# Patient Record
Sex: Male | Born: 1950 | Race: White | Hispanic: No | Marital: Married | Smoking: Never smoker
Health system: Southern US, Community
[De-identification: ages and names within clinical notes are randomized; demographics above are authoritative.]

## PROBLEM LIST (undated history)

## (undated) DIAGNOSIS — M5030 Other cervical disc degeneration, unspecified cervical region: Secondary | ICD-10-CM

## (undated) DIAGNOSIS — T7840XA Allergy, unspecified, initial encounter: Secondary | ICD-10-CM

## (undated) DIAGNOSIS — R0602 Shortness of breath: Secondary | ICD-10-CM

## (undated) DIAGNOSIS — M1990 Unspecified osteoarthritis, unspecified site: Secondary | ICD-10-CM

## (undated) DIAGNOSIS — C801 Malignant (primary) neoplasm, unspecified: Secondary | ICD-10-CM

## (undated) DIAGNOSIS — R51 Headache: Secondary | ICD-10-CM

## (undated) DIAGNOSIS — E7800 Pure hypercholesterolemia, unspecified: Secondary | ICD-10-CM

## (undated) DIAGNOSIS — Z8739 Personal history of other diseases of the musculoskeletal system and connective tissue: Secondary | ICD-10-CM

## (undated) DIAGNOSIS — T4145XA Adverse effect of unspecified anesthetic, initial encounter: Secondary | ICD-10-CM

## (undated) DIAGNOSIS — K219 Gastro-esophageal reflux disease without esophagitis: Secondary | ICD-10-CM

## (undated) DIAGNOSIS — K635 Polyp of colon: Secondary | ICD-10-CM

## (undated) DIAGNOSIS — Z85828 Personal history of other malignant neoplasm of skin: Secondary | ICD-10-CM

## (undated) DIAGNOSIS — Z87442 Personal history of urinary calculi: Secondary | ICD-10-CM

## (undated) DIAGNOSIS — G4730 Sleep apnea, unspecified: Secondary | ICD-10-CM

## (undated) DIAGNOSIS — M25519 Pain in unspecified shoulder: Secondary | ICD-10-CM

## (undated) DIAGNOSIS — E785 Hyperlipidemia, unspecified: Secondary | ICD-10-CM

## (undated) DIAGNOSIS — J189 Pneumonia, unspecified organism: Secondary | ICD-10-CM

## (undated) DIAGNOSIS — T8859XA Other complications of anesthesia, initial encounter: Secondary | ICD-10-CM

## (undated) DIAGNOSIS — J309 Allergic rhinitis, unspecified: Secondary | ICD-10-CM

## (undated) HISTORY — PX: JOINT REPLACEMENT: SHX530

## (undated) HISTORY — DX: Personal history of urinary calculi: Z87.442

## (undated) HISTORY — DX: Allergy, unspecified, initial encounter: T78.40XA

## (undated) HISTORY — PX: OTHER SURGICAL HISTORY: SHX169

## (undated) HISTORY — DX: Pure hypercholesterolemia, unspecified: E78.00

## (undated) HISTORY — DX: Hyperlipidemia, unspecified: E78.5

## (undated) HISTORY — DX: Unspecified osteoarthritis, unspecified site: M19.90

## (undated) HISTORY — PX: WISDOM TOOTH EXTRACTION: SHX21

## (undated) HISTORY — DX: Polyp of colon: K63.5

## (undated) HISTORY — DX: Allergic rhinitis, unspecified: J30.9

## (undated) HISTORY — PX: COLONOSCOPY: SHX174

## (undated) HISTORY — PX: APPENDECTOMY: SHX54

## (undated) HISTORY — PX: KNEE ARTHROSCOPY: SHX127

## (undated) HISTORY — DX: Shortness of breath: R06.02

## (undated) HISTORY — PX: SHOULDER SURGERY: SHX246

---

## 2001-01-16 ENCOUNTER — Encounter: Admission: RE | Admit: 2001-01-16 | Discharge: 2001-03-11 | Payer: Self-pay | Admitting: Internal Medicine

## 2002-09-23 ENCOUNTER — Ambulatory Visit (HOSPITAL_COMMUNITY): Admission: RE | Admit: 2002-09-23 | Discharge: 2002-09-23 | Payer: Self-pay | Admitting: *Deleted

## 2004-08-17 ENCOUNTER — Ambulatory Visit: Payer: Self-pay | Admitting: Internal Medicine

## 2004-11-08 ENCOUNTER — Ambulatory Visit: Payer: Self-pay | Admitting: Internal Medicine

## 2006-08-20 ENCOUNTER — Ambulatory Visit: Payer: Self-pay | Admitting: Internal Medicine

## 2006-08-20 LAB — CONVERTED CEMR LAB
ALT: 31 units/L (ref 0–40)
AST: 44 units/L — ABNORMAL HIGH (ref 0–37)
Albumin: 4.1 g/dL (ref 3.5–5.2)
Alkaline Phosphatase: 26 units/L — ABNORMAL LOW (ref 39–117)
BUN: 28 mg/dL — ABNORMAL HIGH (ref 6–23)
Basophils Absolute: 0 10*3/uL (ref 0.0–0.1)
Basophils Relative: 0.6 % (ref 0.0–1.0)
CO2: 33 meq/L — ABNORMAL HIGH (ref 19–32)
Calcium: 9.1 mg/dL (ref 8.4–10.5)
Chloride: 106 meq/L (ref 96–112)
Chol/HDL Ratio, serum: 2.4
Cholesterol: 170 mg/dL (ref 0–200)
Creatinine, Ser: 1.3 mg/dL (ref 0.4–1.5)
Eosinophil percent: 2.7 % (ref 0.0–5.0)
GFR calc non Af Amer: 61 mL/min
Glomerular Filtration Rate, Af Am: 74 mL/min/{1.73_m2}
Glucose, Bld: 94 mg/dL (ref 70–99)
HCT: 48 % (ref 39.0–52.0)
HDL: 72 mg/dL (ref >39.0)
Hemoglobin: 16.4 g/dL (ref 13.0–17.0)
LDL Cholesterol: 88 mg/dL (ref 0–99)
Lymphocytes Relative: 38.6 % (ref 12.0–46.0)
MCHC: 34.3 g/dL (ref 30.0–36.0)
MCV: 95 fL (ref 78.0–100.0)
Monocytes Absolute: 0.7 10*3/uL (ref 0.2–0.7)
Monocytes Relative: 11.7 % — ABNORMAL HIGH (ref 3.0–11.0)
Neutro Abs: 2.7 10*3/uL (ref 1.4–7.7)
Neutrophils Relative %: 46.4 % (ref 43.0–77.0)
PSA: 1.83 ng/mL (ref 0.10–4.00)
Platelets: 266 10*3/uL (ref 150–400)
Potassium: 3.8 meq/L (ref 3.5–5.1)
RBC: 5.05 M/uL (ref 4.22–5.81)
RDW: 12.4 % (ref 11.5–14.6)
Sodium: 148 meq/L — ABNORMAL HIGH (ref 135–145)
TSH: 1.89 microintl units/mL (ref 0.35–5.50)
Total Bilirubin: 1.5 mg/dL — ABNORMAL HIGH (ref 0.3–1.2)
Total Protein: 7.2 g/dL (ref 6.0–8.3)
Triglyceride fasting, serum: 52 mg/dL (ref 0–149)
VLDL: 10 mg/dL (ref 0–40)
WBC: 5.9 10*3/uL (ref 4.5–10.5)

## 2006-08-27 ENCOUNTER — Ambulatory Visit: Payer: Self-pay | Admitting: Internal Medicine

## 2006-09-13 ENCOUNTER — Ambulatory Visit: Payer: Self-pay | Admitting: Internal Medicine

## 2006-09-27 ENCOUNTER — Ambulatory Visit: Payer: Self-pay | Admitting: Gastroenterology

## 2006-09-27 ENCOUNTER — Encounter (INDEPENDENT_AMBULATORY_CARE_PROVIDER_SITE_OTHER): Payer: Self-pay | Admitting: Specialist

## 2007-02-01 ENCOUNTER — Ambulatory Visit: Payer: Self-pay | Admitting: Internal Medicine

## 2007-04-22 DIAGNOSIS — M1990 Unspecified osteoarthritis, unspecified site: Secondary | ICD-10-CM

## 2007-04-22 DIAGNOSIS — E7800 Pure hypercholesterolemia, unspecified: Secondary | ICD-10-CM

## 2007-04-22 DIAGNOSIS — J309 Allergic rhinitis, unspecified: Secondary | ICD-10-CM

## 2007-04-22 HISTORY — DX: Pure hypercholesterolemia, unspecified: E78.00

## 2007-04-22 HISTORY — DX: Unspecified osteoarthritis, unspecified site: M19.90

## 2007-04-22 HISTORY — DX: Allergic rhinitis, unspecified: J30.9

## 2007-05-01 ENCOUNTER — Ambulatory Visit: Payer: Self-pay | Admitting: Internal Medicine

## 2007-05-01 ENCOUNTER — Telehealth (INDEPENDENT_AMBULATORY_CARE_PROVIDER_SITE_OTHER): Payer: Self-pay

## 2007-05-03 ENCOUNTER — Telehealth: Payer: Self-pay | Admitting: Family Medicine

## 2007-05-07 ENCOUNTER — Ambulatory Visit: Payer: Self-pay | Admitting: Internal Medicine

## 2007-05-07 LAB — CONVERTED CEMR LAB
Bilirubin Urine: NEGATIVE
Blood in Urine, dipstick: NEGATIVE
Glucose, Urine, Semiquant: NEGATIVE
Ketones, urine, test strip: NEGATIVE
Nitrite: NEGATIVE
Protein, U semiquant: NEGATIVE
Specific Gravity, Urine: 1.02
Urobilinogen, UA: NEGATIVE
WBC Urine, dipstick: NEGATIVE
pH: 6

## 2007-06-01 ENCOUNTER — Encounter: Payer: Self-pay | Admitting: Internal Medicine

## 2007-06-01 ENCOUNTER — Ambulatory Visit: Payer: Self-pay | Admitting: Family Medicine

## 2007-07-29 ENCOUNTER — Ambulatory Visit: Payer: Self-pay | Admitting: Internal Medicine

## 2007-07-29 DIAGNOSIS — Z87442 Personal history of urinary calculi: Secondary | ICD-10-CM | POA: Insufficient documentation

## 2007-07-29 DIAGNOSIS — E785 Hyperlipidemia, unspecified: Secondary | ICD-10-CM

## 2007-07-29 HISTORY — DX: Personal history of urinary calculi: Z87.442

## 2007-07-29 HISTORY — DX: Hyperlipidemia, unspecified: E78.5

## 2007-07-29 LAB — CONVERTED CEMR LAB
ALT: 61 units/L — ABNORMAL HIGH (ref 0–53)
AST: 47 units/L — ABNORMAL HIGH (ref 0–37)
Albumin: 3.8 g/dL (ref 3.5–5.2)
Alkaline Phosphatase: 41 units/L (ref 39–117)
BUN: 23 mg/dL (ref 6–23)
Basophils Absolute: 0 10*3/uL (ref 0.0–0.1)
Basophils Relative: 0.5 % (ref 0.0–1.0)
Bilirubin Urine: NEGATIVE
Bilirubin, Direct: 0.1 mg/dL (ref 0.0–0.3)
Blood in Urine, dipstick: NEGATIVE
CO2: 32 meq/L (ref 19–32)
Calcium: 9 mg/dL (ref 8.4–10.5)
Chloride: 103 meq/L (ref 96–112)
Cholesterol: 190 mg/dL (ref 0–200)
Creatinine, Ser: 1.1 mg/dL (ref 0.4–1.5)
Eosinophils Absolute: 0.5 10*3/uL (ref 0.0–0.6)
Eosinophils Relative: 7.9 % — ABNORMAL HIGH (ref 0.0–5.0)
GFR calc Af Amer: 89 mL/min
GFR calc non Af Amer: 74 mL/min
Glucose, Bld: 103 mg/dL — ABNORMAL HIGH (ref 70–99)
Glucose, Urine, Semiquant: NEGATIVE
HCT: 42.6 % (ref 39.0–52.0)
HDL: 67.3 mg/dL (ref >39.0)
Hemoglobin: 14.9 g/dL (ref 13.0–17.0)
Ketones, urine, test strip: NEGATIVE
LDL Cholesterol: 112 mg/dL — ABNORMAL HIGH (ref 0–99)
Lymphocytes Relative: 20.8 % (ref 12.0–46.0)
MCHC: 34.9 g/dL (ref 30.0–36.0)
MCV: 96.4 fL (ref 78.0–100.0)
Monocytes Absolute: 0.6 10*3/uL (ref 0.2–0.7)
Monocytes Relative: 9.8 % (ref 3.0–11.0)
Neutro Abs: 4 10*3/uL (ref 1.4–7.7)
Neutrophils Relative %: 61 % (ref 43.0–77.0)
Nitrite: NEGATIVE
PSA: 1.64 ng/mL (ref 0.10–4.00)
Platelets: 262 10*3/uL (ref 150–400)
Potassium: 4.8 meq/L (ref 3.5–5.1)
Protein, U semiquant: NEGATIVE
RBC: 4.42 M/uL (ref 4.22–5.81)
RDW: 12.7 % (ref 11.5–14.6)
Sodium: 141 meq/L (ref 135–145)
Specific Gravity, Urine: 1.01
TSH: 1.58 microintl units/mL (ref 0.35–5.50)
Total Bilirubin: 1 mg/dL (ref 0.3–1.2)
Total CHOL/HDL Ratio: 2.8
Total Protein: 6.4 g/dL (ref 6.0–8.3)
Triglycerides: 54 mg/dL (ref 0–149)
Urobilinogen, UA: 0.2
VLDL: 11 mg/dL (ref 0–40)
WBC Urine, dipstick: NEGATIVE
WBC: 6.4 10*3/uL (ref 4.5–10.5)
pH: 7

## 2007-08-21 ENCOUNTER — Ambulatory Visit: Payer: Self-pay | Admitting: Internal Medicine

## 2007-08-21 DIAGNOSIS — M549 Dorsalgia, unspecified: Secondary | ICD-10-CM | POA: Insufficient documentation

## 2007-08-23 ENCOUNTER — Telehealth: Payer: Self-pay | Admitting: Internal Medicine

## 2007-09-16 ENCOUNTER — Telehealth: Payer: Self-pay | Admitting: Internal Medicine

## 2007-09-24 ENCOUNTER — Encounter: Payer: Self-pay | Admitting: Internal Medicine

## 2007-10-02 ENCOUNTER — Encounter: Payer: Self-pay | Admitting: Internal Medicine

## 2008-03-16 ENCOUNTER — Encounter: Payer: Self-pay | Admitting: Internal Medicine

## 2008-11-09 ENCOUNTER — Telehealth: Payer: Self-pay | Admitting: Internal Medicine

## 2009-03-05 ENCOUNTER — Telehealth: Payer: Self-pay | Admitting: Internal Medicine

## 2009-03-08 ENCOUNTER — Ambulatory Visit: Payer: Self-pay | Admitting: Internal Medicine

## 2009-03-08 LAB — CONVERTED CEMR LAB
ALT: 26 units/L (ref 0–53)
AST: 31 units/L (ref 0–37)
Albumin: 3.9 g/dL (ref 3.5–5.2)
Alkaline Phosphatase: 33 units/L — ABNORMAL LOW (ref 39–117)
BUN: 26 mg/dL — ABNORMAL HIGH (ref 6–23)
Basophils Absolute: 0 10*3/uL (ref 0.0–0.1)
Basophils Relative: 0.8 % (ref 0.0–3.0)
Bilirubin Urine: NEGATIVE
Bilirubin, Direct: 0.1 mg/dL (ref 0.0–0.3)
Blood in Urine, dipstick: NEGATIVE
CO2: 28 meq/L (ref 19–32)
Calcium: 8.9 mg/dL (ref 8.4–10.5)
Chloride: 106 meq/L (ref 96–112)
Cholesterol: 181 mg/dL (ref 0–200)
Creatinine, Ser: 1.2 mg/dL (ref 0.4–1.5)
Eosinophils Absolute: 0.2 10*3/uL (ref 0.0–0.7)
Eosinophils Relative: 5.2 % — ABNORMAL HIGH (ref 0.0–5.0)
GFR calc non Af Amer: 66.1 mL/min (ref >60)
Glucose, Bld: 97 mg/dL (ref 70–99)
Glucose, Urine, Semiquant: NEGATIVE
HCT: 45.4 % (ref 39.0–52.0)
HDL: 65 mg/dL (ref >39.00)
Hemoglobin: 15.8 g/dL (ref 13.0–17.0)
Ketones, urine, test strip: NEGATIVE
LDL Cholesterol: 97 mg/dL (ref 0–99)
Lymphocytes Relative: 35.4 % (ref 12.0–46.0)
Lymphs Abs: 1.6 10*3/uL (ref 0.7–4.0)
MCHC: 34.8 g/dL (ref 30.0–36.0)
MCV: 96.9 fL (ref 78.0–100.0)
Monocytes Absolute: 0.5 10*3/uL (ref 0.1–1.0)
Monocytes Relative: 10.7 % (ref 3.0–12.0)
Neutro Abs: 2.2 10*3/uL (ref 1.4–7.7)
Neutrophils Relative %: 47.9 % (ref 43.0–77.0)
Nitrite: NEGATIVE
PSA: 1.91 ng/mL (ref 0.10–4.00)
Platelets: 214 10*3/uL (ref 150.0–400.0)
Potassium: 4.6 meq/L (ref 3.5–5.1)
Protein, U semiquant: NEGATIVE
RBC: 4.69 M/uL (ref 4.22–5.81)
RDW: 12 % (ref 11.5–14.6)
Sodium: 136 meq/L (ref 135–145)
Specific Gravity, Urine: 1.015
TSH: 1.1 microintl units/mL (ref 0.35–5.50)
Total Bilirubin: 1.2 mg/dL (ref 0.3–1.2)
Total CHOL/HDL Ratio: 3
Total Protein: 6.8 g/dL (ref 6.0–8.3)
Triglycerides: 94 mg/dL (ref 0.0–149.0)
Urobilinogen, UA: 0.2
VLDL: 18.8 mg/dL (ref 0.0–40.0)
WBC Urine, dipstick: NEGATIVE
WBC: 4.5 10*3/uL (ref 4.5–10.5)
pH: 7

## 2009-03-10 ENCOUNTER — Ambulatory Visit: Payer: Self-pay | Admitting: Internal Medicine

## 2009-05-28 ENCOUNTER — Ambulatory Visit: Payer: Self-pay | Admitting: Internal Medicine

## 2009-05-28 DIAGNOSIS — M65849 Other synovitis and tenosynovitis, unspecified hand: Secondary | ICD-10-CM

## 2009-05-28 DIAGNOSIS — M65839 Other synovitis and tenosynovitis, unspecified forearm: Secondary | ICD-10-CM | POA: Insufficient documentation

## 2009-06-02 ENCOUNTER — Telehealth: Payer: Self-pay | Admitting: Internal Medicine

## 2009-07-22 ENCOUNTER — Ambulatory Visit: Payer: Self-pay | Admitting: Internal Medicine

## 2010-03-15 ENCOUNTER — Telehealth: Payer: Self-pay | Admitting: Internal Medicine

## 2010-08-16 ENCOUNTER — Encounter: Payer: Self-pay | Admitting: Internal Medicine

## 2010-08-16 ENCOUNTER — Telehealth: Payer: Self-pay | Admitting: Internal Medicine

## 2010-08-19 ENCOUNTER — Encounter: Admission: RE | Admit: 2010-08-19 | Discharge: 2010-08-19 | Payer: Self-pay | Admitting: Specialist

## 2010-10-25 NOTE — Letter (Signed)
Summary: [facility]  [facility]   Imported By: Maryln Gottron 08/25/2010 14:12:33  _____________________________________________________________________  External Attachment:    Type:   Image     Comment:   External Document

## 2010-10-25 NOTE — Progress Notes (Signed)
Summary: 90 day rx of Vicodin  Phone Note Refill Request Message from:  Fax from Pharmacy on March 15, 2010 3:45 PM  Refills Requested: Medication #1:  VICODIN 5-500 MG TABS 1 by mouth q 4 hours as needed. per fax from [facility] - pt leaving the country - wants a 90 days supply   Method Requested: Telephone to Pharmacy Initial call taken by: Duard Brady LPN,  March 15, 2010 3:46 PM  Follow-up for Phone Call        This was just okay'd # 50 with 4RF today. KIK Follow-up by: Duard Brady LPN,  March 15, 2010 3:46 PM  Additional Follow-up for Phone Call Additional follow up Details #1::        OK to change to 120 Additional Follow-up by: Gordy Savers  MD,  March 15, 2010 3:59 PM    Additional Follow-up for Phone Call Additional follow up Details #2::    spoke with joe at [facility] - cx #50 4rf and gave ok on disp #120 no additional refills. KIK Follow-up by: Duard Brady LPN,  March 15, 2010 4:33 PM  Prescriptions: VICODIN 5-500 MG TABS (HYDROCODONE-ACETAMINOPHEN) 1 by mouth q 4 hours as needed.  #120 x 0   Entered by:   Duard Brady LPN   Authorized by:   Gordy Savers  MD   Signed by:   Duard Brady LPN on 16/06/9603   Method used:   Historical   RxID:   [id number]

## 2010-10-25 NOTE — Progress Notes (Signed)
Summary: refill vicodin and pravastatin  Phone Note Refill Request Message from:  Fax from Pharmacy on August 16, 2010 12:05 PM  Refills Requested: Medication #1:  VICODIN 5-500 MG TABS 1 by mouth q 4 hours as needed.   Last Refilled: 07/15/2010  Medication #2:  PRAVASTATIN SODIUM 40 MG TABS one daily   Last Refilled: 07/11/2010 [facility] [address]   Method Requested: Fax to Local Pharmacy Initial call taken by: Duard Brady LPN,  August 16, 2010 12:06 PM    Prescriptions: PRAVASTATIN SODIUM 40 MG TABS (PRAVASTATIN SODIUM) one daily  #90 x 0   Entered by:   Duard Brady LPN   Authorized by:   Gordy Savers  MD   Signed by:   Duard Brady LPN on 16/06/9603   Method used:   Historical   RxID:   [id number] VICODIN 5-500 MG TABS (HYDROCODONE-ACETAMINOPHEN) 1 by mouth q 4 hours as needed.  #90 x 0   Entered by:   Duard Brady LPN   Authorized by:   Gordy Savers  MD   Signed by:   Duard Brady LPN on 21/30/8657   Method used:   Historical   RxID:   [id number]  #90 each

## 2010-10-27 NOTE — Letter (Signed)
Summary: [facility]  [facility]   Imported By: Maryln Gottron 09/07/2010 09:04:07  _____________________________________________________________________  External Attachment:    Type:   Image     Comment:   External Document

## 2010-11-16 ENCOUNTER — Other Ambulatory Visit: Payer: Self-pay | Admitting: Internal Medicine

## 2011-02-07 NOTE — Assessment & Plan Note (Signed)
[facility]                                 ON-CALL NOTE   NAME:[name] [name], [name] [name]                        MRN:          [id number]  DATE:06/01/2007                            DOB:          Jul 07, 1951    TIME:  12:00.   OBJECTIVE:  The patient just landed from traveling and must leave again  tomorrow due to his work. Had a plug of wax in his ear this morning when  he woke up. He tried to get it out with a Q-tip and ended up pushing it  into his ear and now cannot hear out of that ear and is having  tenderness.   IMPRESSION:  Probable cerumen impaction.   PLAN:  The patient will come in to be seen.   PRIMARY CARE PHYSICIAN:  Eleonore Chiquito, M.D., [facility] office.     Arta Silence, MD  Electronically Signed    RNS/MedQ  DD: 06/01/2007  DT: 06/01/2007  Job #: [id number]   cc:   Gordy Savers, MD

## 2011-02-10 NOTE — Assessment & Plan Note (Signed)
[facility]                            [facility] OFFICE NOTE   NAME:[name] [name]                        MRN:          [id number]  DATE:08/27/2006                            DOB:          [birth year]    A 60 year old gentleman who is seen today for an annual exam and has a  history of hypercholesterolemia controlled on Lipitor, mild seasonal  allergic rhinitis. He has DJD affecting primarily his shoulders and  knees. He has had a remote appendectomy. His rheumatologic status seems  fairly stable.   SOCIAL HISTORY:  He has been unemployed until recently, presently  commuting to the [address] 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   FAMILY HISTORY:  Unchanged.   REVIEW OF SYSTEMS:  Otherwise negative. Still no colonoscopy.   PHYSICAL EXAMINATION:  VITAL SIGNS:  Blood pressure was 120/80.  SKIN:  Negative.  HEENT:  Fundi, ear, nose and throat clear.  NECK:  No adenopathy or bruits.  CHEST:  Clear.  CARDIOVASCULAR:  Normal heart sounds, no murmurs.  ABDOMEN:  Soft and nontender. No organomegaly.  EXTERNAL GENITALIA:  Normal.  RECTAL:  Prostate was benign, +1 to 2 enlarged, stool was trace heme  positive.  EXTREMITIES:  Negative with full peripheral pulses.  NEUROLOGIC:  Negative.   IMPRESSION:  Hypercholesterolemia, degenerative joint disease, seasonal  allergic rhinitis, heme positive stool.   DISPOSITION:  The patient is agreeable to a colonoscopy. This will be  set up at his earliest convenience. Return here in one year or p.r.n.     Gordy Savers, MD  Electronically Signed    PFK/MedQ  DD: 08/27/2006  DT: 08/28/2006  Job #: [id number]

## 2011-02-17 ENCOUNTER — Other Ambulatory Visit: Payer: Self-pay | Admitting: Internal Medicine

## 2011-02-17 NOTE — Telephone Encounter (Signed)
Spoke with pt - per dr.kwiatkowsi - needs to be seen for future refills - last seen 2010 , will need cpx with labs. Okay'd [facility] day RF to [facility]

## 2011-02-17 NOTE — Telephone Encounter (Signed)
Faxes were sent here this morning and would like them sent back to [facility]. Going out of town tomorrow.

## 2011-03-24 ENCOUNTER — Encounter: Payer: Self-pay | Admitting: Internal Medicine

## 2011-03-27 ENCOUNTER — Ambulatory Visit (INDEPENDENT_AMBULATORY_CARE_PROVIDER_SITE_OTHER): Payer: Self-pay | Admitting: Internal Medicine

## 2011-03-27 ENCOUNTER — Encounter: Payer: Self-pay | Admitting: Internal Medicine

## 2011-03-27 VITALS — BP 100/70 | HR 60 | Temp 98.1°F | Resp 16 | Ht 68.75 in | Wt 173.0 lb

## 2011-03-27 DIAGNOSIS — Z0000 Encounter for general adult medical examination without abnormal findings: Secondary | ICD-10-CM

## 2011-03-27 DIAGNOSIS — Z87442 Personal history of urinary calculi: Secondary | ICD-10-CM

## 2011-03-27 DIAGNOSIS — Z23 Encounter for immunization: Secondary | ICD-10-CM

## 2011-03-27 DIAGNOSIS — M1990 Unspecified osteoarthritis, unspecified site: Secondary | ICD-10-CM

## 2011-03-27 DIAGNOSIS — E7800 Pure hypercholesterolemia, unspecified: Secondary | ICD-10-CM

## 2011-03-27 LAB — CBC WITH DIFFERENTIAL/PLATELET
Basophils Relative: 0.7 % (ref 0.0–3.0)
Eosinophils Absolute: 0.2 10*3/uL (ref 0.0–0.7)
Eosinophils Relative: 4.3 % (ref 0.0–5.0)
Lymphocytes Relative: 29.9 % (ref 12.0–46.0)
Neutrophils Relative %: 54.1 % (ref 43.0–77.0)
Platelets: 212 10*3/uL (ref 150.0–400.0)
RBC: 4.14 Mil/uL — ABNORMAL LOW (ref 4.22–5.81)
WBC: 5.4 10*3/uL (ref 4.5–10.5)

## 2011-03-27 LAB — HEPATIC FUNCTION PANEL
Alkaline Phosphatase: 34 U/L — ABNORMAL LOW (ref 39–117)
Bilirubin, Direct: 0.2 mg/dL (ref 0.0–0.3)
Total Bilirubin: 1.2 mg/dL (ref 0.3–1.2)

## 2011-03-27 LAB — LIPID PANEL
HDL: 81 mg/dL (ref >39.00)
Total CHOL/HDL Ratio: 3
Triglycerides: 46 mg/dL (ref 0.0–149.0)
VLDL: 9.2 mg/dL (ref 0.0–40.0)

## 2011-03-27 LAB — BASIC METABOLIC PANEL
BUN: 28 mg/dL — ABNORMAL HIGH (ref 6–23)
Chloride: 104 mEq/L (ref 96–112)
GFR: 68.94 mL/min (ref >60.00)
Glucose, Bld: 88 mg/dL (ref 70–99)
Potassium: 4.5 mEq/L (ref 3.5–5.1)
Sodium: 140 mEq/L (ref 135–145)

## 2011-03-27 LAB — LDL CHOLESTEROL, DIRECT: Direct LDL: 121.4 mg/dL

## 2011-03-27 LAB — URIC ACID: Uric Acid, Serum: 7.2 mg/dL (ref 4.0–7.8)

## 2011-03-27 MED ORDER — ALLOPURINOL 100 MG PO TABS: 100.0000 mg | ORAL_TABLET | Freq: Every day | ORAL | Status: DC

## 2011-03-27 MED ORDER — PREGABALIN 75 MG PO CAPS: 75.0000 mg | ORAL_CAPSULE | Freq: Two times a day (BID) | ORAL | Status: DC | PRN

## 2011-03-27 MED ORDER — PRAVASTATIN SODIUM 40 MG PO TABS: 40.0000 mg | ORAL_TABLET | Freq: Every day | ORAL | Status: DC

## 2011-03-27 MED ORDER — NAPROXEN SODIUM 550 MG PO TABS: 550.0000 mg | ORAL_TABLET | Freq: Two times a day (BID) | ORAL | Status: DC

## 2011-03-27 MED ORDER — TADALAFIL 20 MG PO TABS: 20.0000 mg | ORAL_TABLET | ORAL | Status: DC

## 2011-03-27 MED ORDER — HYDROCODONE-ACETAMINOPHEN 5-500 MG PO TABS: 1.0000 | ORAL_TABLET | Freq: Four times a day (QID) | ORAL | Status: DC | PRN

## 2011-03-27 NOTE — Progress Notes (Signed)
Subjective:    Patient ID: [name] [name], male    DOB: [birth year], 60 y.o.   MRN: [id number]  HPI  60 year old patient who is seen today for a health maintenance examination. He is doing quite well. Since his last visit here he has lost considerable weight due to the low back and knee pain. He is followed closely by orthopedics. He has a history of nephrolithiasis that has been diagnosed as you're casted stones. He is now on allopurinol. He has dyslipidemia controlled on pravastatin. He continues to tolerate this well. His only complaints are orthopedic. He is scheduled for orthopedic followup later today.  Wt Readings from Last 3 Encounters:  03/27/11 173 lb (78.472 kg)  07/22/09 190 lb (86.183 kg)  05/28/09 198 lb (89.812 kg)    Problems Prior to Update:  1) Back Pain (ICD-724.5)  2) Osteoarthritis (ICD-715.90)  3) Nephrolithiasis, Hx of (ICD-V13.01)  4) Hyperlipidemia (ICD-272.4)  5) Health Maintenance Exam (ICD-V70.0)  6) Allergic Rhinitis (ICD-477.9)  7) Hypercholesterolemia (ICD-272.0)  8) Degenerative Joint Disease (ICD-715.90)  9) Family History of Melanoma (ICD-V16.8)  Allergies:  1) ! Pcn  2) ! Erythromycin  3) ! Amoxicillin  4) ! Benadryl  5) ! Codeine  Past History:  Past Medical History:  Allergic rhinitis,seasonal  Hyperlipidemia  Nephrolithiasis, hx of colic  history of spinal stenosis  Osteoarthritis  Back Pain 2008  Past Surgical History:  Colonoscopy-09/27/2006  Appendectomy  right knee arthroscopic surgery  right shoulder surgery, August 2004  was in the extraction November 2003  Family History:  Reviewed history from 07/29/2007 and no changes required.  Family History Lung cancer  Family History Other cancer-Breast  Family History of Melanoma  Fam hx COPD  father history of COPD, glucose intolerance; PSVT  mother history of breast cancer and melanoma  Two brothers one sister in good health-obesity  Maternal grandfather lung cancer  Social  History:  Reviewed history from 07/29/2007 and no changes required.  Never Smoked  Married  one daughter Married 02-2009    Review of Systems  Constitutional: Negative for fever, chills, activity change, appetite change and fatigue.  HENT: Negative for hearing loss, ear pain, congestion, rhinorrhea, sneezing, mouth sores, trouble swallowing, neck pain, neck stiffness, dental problem, voice change, sinus pressure and tinnitus.   Eyes: Negative for photophobia, pain, redness and visual disturbance.  Respiratory: Negative for apnea, cough, choking, chest tightness, shortness of breath and wheezing.   Cardiovascular: Negative for chest pain, palpitations and leg swelling.  Gastrointestinal: Negative for nausea, vomiting, abdominal pain, diarrhea, constipation, blood in stool, abdominal distention, anal bleeding and rectal pain.  Genitourinary: Negative for dysuria, urgency, frequency, hematuria, flank pain, decreased urine volume, discharge, penile swelling, scrotal swelling, difficulty urinating, genital sores and testicular pain.  Musculoskeletal: Positive for back pain, joint swelling and gait problem. Negative for myalgias and arthralgias.  Skin: Negative for color change, rash and wound.  Neurological: Negative for dizziness, tremors, seizures, syncope, facial asymmetry, speech difficulty, weakness, light-headedness, numbness and headaches.  Hematological: Negative for adenopathy. Does not bruise/bleed easily.  Psychiatric/Behavioral: Negative for suicidal ideas, hallucinations, behavioral problems, confusion, sleep disturbance, self-injury, dysphoric mood, decreased concentration and agitation. The patient is not nervous/anxious.        Objective:   Physical Exam  Constitutional: He is oriented to person, place, and time. He appears well-developed.  HENT:  Head: Normocephalic.  Right Ear: External ear normal.  Left Ear: External ear normal.  Eyes: Conjunctivae and EOM are normal.    Neck:  Normal range of motion.  Cardiovascular: Normal rate and normal heart sounds.        Pulse slow and slightly irregular  Pulmonary/Chest: Effort normal and breath sounds normal.  Abdominal: Bowel sounds are normal.  Musculoskeletal: Normal range of motion. He exhibits no edema and no tenderness.       Right knee with [name] her arthritic changes and deformity  Neurological: He is alert and oriented to person, place, and time.  Psychiatric: He has a normal mood and affect. His behavior is normal.          Assessment & Plan:   Unremarkable health maintenance  examination Advanced osteoarthritis with low back pain and right knee pain Dyslipidemia  Laboratory studies will be reviewed including a uric acid level Return here in one year or when necessary

## 2011-03-27 NOTE — Patient Instructions (Signed)
It is important that you exercise regularly, at least 20 minutes 3 to 4 times per week.  If you develop chest pain or shortness of breath seek  medical attention.  Return in one year for follow-up   

## 2011-03-27 NOTE — Progress Notes (Signed)
Quick Note:  Labs ready for pick up KIK ______

## 2011-03-28 ENCOUNTER — Encounter: Payer: Self-pay | Admitting: Internal Medicine

## 2011-05-15 ENCOUNTER — Ambulatory Visit (HOSPITAL_BASED_OUTPATIENT_CLINIC_OR_DEPARTMENT_OTHER)
Admission: RE | Admit: 2011-05-15 | Discharge: 2011-05-15 | Disposition: A | Discharge status: Home / Self Care | Payer: PRIVATE HEALTH INSURANCE | Source: Ambulatory Visit | Attending: Specialist | Admitting: Specialist

## 2011-05-15 DIAGNOSIS — M2340 Loose body in knee, unspecified knee: Secondary | ICD-10-CM | POA: Insufficient documentation

## 2011-05-15 DIAGNOSIS — Z01812 Encounter for preprocedural laboratory examination: Secondary | ICD-10-CM | POA: Insufficient documentation

## 2011-05-15 DIAGNOSIS — IMO0002 Reserved for concepts with insufficient information to code with codable children: Secondary | ICD-10-CM | POA: Insufficient documentation

## 2011-05-15 DIAGNOSIS — M659 Unspecified synovitis and tenosynovitis, unspecified site: Secondary | ICD-10-CM | POA: Insufficient documentation

## 2011-05-15 DIAGNOSIS — Z130 Encounter for screening for diseases of the blood and blood-forming organs and certain disorders involving the immune mechanism: Secondary | ICD-10-CM | POA: Insufficient documentation

## 2011-05-15 DIAGNOSIS — M23305 Other meniscus derangements, unspecified medial meniscus, unspecified knee: Secondary | ICD-10-CM | POA: Insufficient documentation

## 2011-05-15 DIAGNOSIS — X58XXXA Exposure to other specified factors, initial encounter: Secondary | ICD-10-CM | POA: Insufficient documentation

## 2011-05-15 DIAGNOSIS — M1710 Unilateral primary osteoarthritis, unspecified knee: Secondary | ICD-10-CM | POA: Insufficient documentation

## 2011-05-15 DIAGNOSIS — M21869 Other specified acquired deformities of unspecified lower leg: Secondary | ICD-10-CM | POA: Insufficient documentation

## 2011-05-15 LAB — POCT HEMOGLOBIN-HEMACUE: Hemoglobin: 15.6 g/dL (ref 13.0–17.0)

## 2011-05-17 NOTE — Op Note (Signed)
NAME:  [name], [name] NO.:  [id number]  MEDICAL RECORD NO.:  [id number]  LOCATION:                                 FACILITY:  PHYSICIAN:  Jene Every, M.D.         DATE OF BIRTH:  DATE OF PROCEDURE:  05/15/2011 DATE OF DISCHARGE:                              OPERATIVE REPORT   PREOPERATIVE DIAGNOSES: 1. Degenerative joint disease, right knee. 2. Meniscus tear.  POSTOPERATIVE DIAGNOSES: 1. Degenerative joint disease, right knee. 2. Meniscus tear. 3. Loose bodies.  PROCEDURE PERFORMED: 1. Right knee arthroscopy. 2. Partial medial meniscectomy. 3. Debridement of patellofemoral joint, evacuation of loose bodies.  BRIEF HISTORY:  This is a 60 year old with varus deformity, significant degenerative changes of medial compartment, patellofemoral arthrosis, MRI indicating meniscus tear, who is indicated for arthroscopic debridement, partial meniscectomy.  Though we had talked about total knee replacement, we were attempting to try all possible conservative measures even though he had a varus deformity, significant arthritis. We discussed risks and benefits including bleeding, infection, damage to neurovascular structures, no change in symptoms, worsening symptoms, need for repeat debridement, DVT, PE, anesthetic complications, and need for total knee.  PREOPERATIVE EXAM:  Patient had a small abrasion over the anterior aspect of the patella from the contusion.  It was not infected. Therefore, after prepping and drape technique, there was no evidence of infection.  TECHNIQUE:  With the patient in supine position after induction of adequate anesthesia, a gram of Kefzol and a gram of vancomycin, the right lower extremity was prepped and draped in usual sterile fashion. Placed an OpSite over the small anterior abrasion that was outside where the portals were.  A lateral parapatellar portal and superomedial parapatellar portal were fashioned with a #11 blade.   Ingress cannula atraumatically placed.  Irrigant was utilized to insufflate joint. Under direct visualization, medial parapatellar portal was fashioned with a #11 blade after localization with 18-gauge needle sparing the medial meniscus.  There were extensive grade 4 changes of medial compartment, both on the femoral condyle and the tibial plateau.  Tibial plateau, there was a small area of light cartilage that still remained by complex tearing of the medial meniscus, but basically, the entire posterior half debrided with a 3.5 Cuda shaver and performed a partial medial meniscectomy to a stable base.  He had approximately 30% of the posterior half remaining.  The remnant was stable to probe palpation.  ACL was frayed in partial tearing.  There was hypertrophic synovitis, this was debrided as well.  Difficulty obtaining the access to the lateral compartment, however, was able to do so.  There was essentially normal femoral condyle and tibial plateau.  Mild fraying of the meniscus was copiously lavaged.  There was one small grade 4 lesion near the notch on the medial femoral condyle nonweightbearing surface.  Suprapatellar pouch revealed extensive grade 3 changes of the patella and the sulcus, so chondroplasty was performed.  There was normal patellofemoral tracking.  The gutters were unremarkable.  Copiously lavaged the knee, reexamined the medial compartment, we had evacuated loose bodies.  No further pathology amenable to arthroscopic intervention, and therefore, removed all instrumentation.  Portals were closed with  4-0 nylon simple sutures.  0.25% Marcaine with epinephrine was infiltrated into the joint.  Wounds dressed sterilely.  Woken without difficulty and transported to recovery room in satisfactory condition.  The patient tolerated the procedure well.  No complications.  No assistant.  At the conclusion of the procedure, I also placed an OpSite over the small area of  abrasion.     Jene Every, M.D.     Cordelia Pen  D:  05/15/2011  T:  05/16/2011  Job:  [id number]  Electronically Signed by Jene Every M.D. on 05/17/2011 10:19:51 AM

## 2011-05-24 ENCOUNTER — Telehealth: Payer: Self-pay | Admitting: Internal Medicine

## 2011-05-24 ENCOUNTER — Other Ambulatory Visit: Payer: Self-pay | Admitting: Internal Medicine

## 2011-05-24 DIAGNOSIS — M1990 Unspecified osteoarthritis, unspecified site: Secondary | ICD-10-CM

## 2011-05-24 MED ORDER — ALLOPURINOL 100 MG PO TABS: 100.0000 mg | ORAL_TABLET | Freq: Every day | ORAL | Status: DC

## 2011-05-24 MED ORDER — NAPROXEN SODIUM 550 MG PO TABS: 550.0000 mg | ORAL_TABLET | Freq: Two times a day (BID) | ORAL | Status: DC

## 2011-05-24 NOTE — Telephone Encounter (Signed)
Patient requests to be tested for allergies; he will only be in the country for this week...call patient with steps he must take to have this done.

## 2011-05-25 MED ORDER — EPINEPHRINE 0.3 MG/0.3ML IJ DEVI: 0.3000 mg | Freq: Once | INTRAMUSCULAR | Status: DC

## 2011-05-25 NOTE — Telephone Encounter (Signed)
Try to make appt with Dr  Callas

## 2011-05-25 NOTE — Telephone Encounter (Signed)
Spoke with pt - his schedule has changed and he will have to put off appt until later.

## 2011-05-25 NOTE — Telephone Encounter (Signed)
Please advise 

## 2011-06-19 ENCOUNTER — Other Ambulatory Visit: Payer: Self-pay | Admitting: Internal Medicine

## 2011-08-14 ENCOUNTER — Other Ambulatory Visit: Payer: Self-pay | Admitting: Internal Medicine

## 2011-10-31 ENCOUNTER — Telehealth: Payer: Self-pay | Admitting: Internal Medicine

## 2011-10-31 MED ORDER — HYDROCODONE-ACETAMINOPHEN 5-500 MG PO TABS: 1.0000 | ORAL_TABLET | Freq: Four times a day (QID) | ORAL | Status: DC | PRN

## 2011-10-31 NOTE — Telephone Encounter (Signed)
Pt last seen 03/27/2011 and rx filled same day.  Pls advise.

## 2011-10-31 NOTE — Telephone Encounter (Signed)
Pt called req refill of HYDROcodone-acetaminophen (VICODIN) 5-500 MG per tablet to [facility] [address]. Pt is req that this be called in today, because pt is leaving to go out of country tomorrow. Pt said that he had req this last wk via the pharmacy. Pls call in today.

## 2011-10-31 NOTE — Telephone Encounter (Signed)
Rx called in to pharmacy. Pt is aware.  

## 2011-10-31 NOTE — Telephone Encounter (Signed)
ok 

## 2011-12-21 ENCOUNTER — Other Ambulatory Visit: Payer: Self-pay | Admitting: Internal Medicine

## 2011-12-21 NOTE — Telephone Encounter (Signed)
Sent to pharmacy 

## 2011-12-21 NOTE — Telephone Encounter (Signed)
Pt requesting refill on pravastatin (PRAVACHOL) 40 MG tablet, allopurinol (ZYLOPRIM) 100 MG tablet and tadalafil (CIALIS) 20 MG tablet   [address] [facility]

## 2012-04-09 ENCOUNTER — Other Ambulatory Visit: Payer: Self-pay | Admitting: Internal Medicine

## 2012-04-22 ENCOUNTER — Telehealth: Payer: Self-pay | Admitting: Internal Medicine

## 2012-04-22 NOTE — Telephone Encounter (Signed)
Spoke with pt- gave name and office number

## 2012-04-22 NOTE — Telephone Encounter (Signed)
Patient is looking for a recommendation for an eye doctor he can use.  Please call patient.

## 2012-04-22 NOTE — Telephone Encounter (Signed)
Dr Groat 

## 2012-04-22 NOTE — Telephone Encounter (Signed)
Please advise 

## 2012-04-29 ENCOUNTER — Other Ambulatory Visit (INDEPENDENT_AMBULATORY_CARE_PROVIDER_SITE_OTHER): Payer: PRIVATE HEALTH INSURANCE

## 2012-04-29 DIAGNOSIS — Z0000 Encounter for general adult medical examination without abnormal findings: Secondary | ICD-10-CM

## 2012-04-29 LAB — CBC WITH DIFFERENTIAL/PLATELET
Basophils Relative: 1 % (ref 0.0–3.0)
Eosinophils Absolute: 0.3 10*3/uL (ref 0.0–0.7)
Eosinophils Relative: 5.9 % — ABNORMAL HIGH (ref 0.0–5.0)
HCT: 45.6 % (ref 39.0–52.0)
Hemoglobin: 15.4 g/dL (ref 13.0–17.0)
Lymphs Abs: 1.8 10*3/uL (ref 0.7–4.0)
MCHC: 33.7 g/dL (ref 30.0–36.0)
MCV: 100.6 fl — ABNORMAL HIGH (ref 78.0–100.0)
Monocytes Absolute: 0.5 10*3/uL (ref 0.1–1.0)
Neutro Abs: 2.3 10*3/uL (ref 1.4–7.7)
Neutrophils Relative %: 46.4 % (ref 43.0–77.0)
RBC: 4.53 Mil/uL (ref 4.22–5.81)
WBC: 4.9 10*3/uL (ref 4.5–10.5)

## 2012-04-29 LAB — HEPATIC FUNCTION PANEL
ALT: 26 U/L (ref 0–53)
Albumin: 3.9 g/dL (ref 3.5–5.2)
Total Protein: 6.5 g/dL (ref 6.0–8.3)

## 2012-04-29 LAB — POCT URINALYSIS DIPSTICK
Bilirubin, UA: NEGATIVE
Blood, UA: NEGATIVE
Glucose, UA: NEGATIVE
Spec Grav, UA: 1.015
Urobilinogen, UA: 0.2

## 2012-04-29 LAB — LIPID PANEL
Cholesterol: 195 mg/dL (ref 0–200)
LDL Cholesterol: 113 mg/dL — ABNORMAL HIGH (ref 0–99)
Triglycerides: 90 mg/dL (ref 0.0–149.0)

## 2012-04-29 LAB — BASIC METABOLIC PANEL
CO2: 30 mEq/L (ref 19–32)
Chloride: 103 mEq/L (ref 96–112)
Creatinine, Ser: 1.2 mg/dL (ref 0.4–1.5)
Potassium: 4.6 mEq/L (ref 3.5–5.1)

## 2012-04-29 LAB — PSA: PSA: 1.39 ng/mL (ref 0.10–4.00)

## 2012-04-29 LAB — TSH: TSH: 1.65 u[IU]/mL (ref 0.35–5.50)

## 2012-05-01 ENCOUNTER — Ambulatory Visit (INDEPENDENT_AMBULATORY_CARE_PROVIDER_SITE_OTHER): Payer: PRIVATE HEALTH INSURANCE | Admitting: Internal Medicine

## 2012-05-01 ENCOUNTER — Encounter: Payer: Self-pay | Admitting: Internal Medicine

## 2012-05-01 VITALS — BP 106/70 | HR 64 | Temp 97.9°F | Resp 20 | Ht 68.75 in | Wt 180.0 lb

## 2012-05-01 DIAGNOSIS — Z0000 Encounter for general adult medical examination without abnormal findings: Secondary | ICD-10-CM

## 2012-05-01 DIAGNOSIS — Z87442 Personal history of urinary calculi: Secondary | ICD-10-CM

## 2012-05-01 DIAGNOSIS — E7800 Pure hypercholesterolemia, unspecified: Secondary | ICD-10-CM

## 2012-05-01 DIAGNOSIS — M549 Dorsalgia, unspecified: Secondary | ICD-10-CM

## 2012-05-01 MED ORDER — PREGABALIN 75 MG PO CAPS: 75.0000 mg | ORAL_CAPSULE | Freq: Two times a day (BID) | ORAL | Status: DC

## 2012-05-01 MED ORDER — TADALAFIL 20 MG PO TABS: 20.0000 mg | ORAL_TABLET | Freq: Every day | ORAL | Status: DC | PRN

## 2012-05-01 MED ORDER — PRAVASTATIN SODIUM 40 MG PO TABS: 40.0000 mg | ORAL_TABLET | Freq: Every day | ORAL | Status: DC

## 2012-05-01 MED ORDER — ALLOPURINOL 100 MG PO TABS: 100.0000 mg | ORAL_TABLET | Freq: Every day | ORAL | Status: DC

## 2012-05-01 NOTE — Progress Notes (Signed)
Subjective:    Patient ID: [name] [name], male    DOB: 1950/11/26, 61 y.o.   MRN: [id number]  HPI 15 -year-old patient who is seen today for a health maintenance examination. He is doing quite well. Since his last visit here he has lost considerable weight due to the low back and knee pain. He is followed closely by orthopedics. 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 He is now on allopurinol. He has dyslipidemia controlled on pravastatin. He continues to tolerate this well. His only complaints are orthopedic. He is scheduled for orthopedic followup right total knee replacement surgery. He is seen today for surgical clearance. He is doing quite well clinically except for arthritic concerns and denies any cardiopulmonary complaints. He is quite active physically without exercise restrictions Wt Readings from Last 3 Encounters:  05/01/12 180 lb (81.647 kg)  03/27/11 173 lb (78.472 kg)  07/22/09 190 lb (86.183 kg)    Problems Prior to Update:  1) Back Pain (ICD-724.5)  2) Osteoarthritis (ICD-715.90)  3) Nephrolithiasis, Hx of (ICD-V13.01)  4) Hyperlipidemia (ICD-272.4)  5) Health Maintenance Exam (ICD-V70.0)  6) Allergic Rhinitis (ICD-477.9)  7) Hypercholesterolemia (ICD-272.0)  8) Degenerative Joint Disease (ICD-715.90)  9) Family History of Melanoma (ICD-V16.8)   Allergies:  1) ! Pcn  2) ! Erythromycin  3) ! Amoxicillin  4) ! Benadryl  5) ! Codeine   Past History:  Past Medical History:   Allergic rhinitis,seasonal  Hyperlipidemia  Nephrolithiasis, hx of colic  history of spinal stenosis  Osteoarthritis  Back Pain 2008   Past Surgical History:  Colonoscopy-09/27/2006  Appendectomy  right knee arthroscopic surgery  9-12  right shoulder surgery, August 2004  was in the extraction November 2003   Family History:  Reviewed history from 07/29/2007 and no changes required.  Family History Lung cancer  Family History Other  cancer-Breast  Family History of Melanoma  Fam hx COPD  father history of COPD, glucose intolerance; PSVT  mother history of breast cancer and melanoma died 2012/10/29Two brothers one sister in good health-obesity  Maternal grandfather lung cancer   Social History:  Reviewed history from 07/29/2007 and no changes required.  Never Smoked  Married  one daughter Married 02-2009    Review of Systems  Constitutional: Negative for fever, chills, activity change, appetite change and fatigue.  HENT: Negative for hearing loss, ear pain, congestion, rhinorrhea, sneezing, mouth sores, trouble swallowing, neck pain, neck stiffness, dental problem, voice change, sinus pressure and tinnitus.   Eyes: Negative for photophobia, pain, redness and visual disturbance.  Respiratory: Negative for apnea, cough, choking, chest tightness, shortness of breath and wheezing.   Cardiovascular: Negative for chest pain, palpitations and leg swelling.  Gastrointestinal: Negative for nausea, vomiting, abdominal pain, diarrhea, constipation, blood in stool, abdominal distention, anal bleeding and rectal pain.  Genitourinary: Negative for dysuria, urgency, frequency, hematuria, flank pain, decreased urine volume, discharge, penile swelling, scrotal swelling, difficulty urinating, genital sores and testicular pain.  Musculoskeletal: Positive for back pain, joint swelling and gait problem. Negative for myalgias and arthralgias.  Skin: Negative for color change, rash and wound.  Neurological: Negative for dizziness, tremors, seizures, syncope, facial asymmetry, speech difficulty, weakness, light-headedness, numbness and headaches.  Hematological: Negative for adenopathy. Does not bruise/bleed easily.  Psychiatric/Behavioral: Negative for suicidal ideas, hallucinations, behavioral problems, confusion, disturbed wake/sleep cycle, self-injury, dysphoric mood, decreased concentration and agitation. The patient is not  nervous/anxious.        Objective:  Physical Exam  Constitutional: He is oriented to person, place, and time. He appears well-developed.  HENT:  Head: Normocephalic.  Right Ear: External ear normal.  Left Ear: External ear normal.  Eyes: Conjunctivae and EOM are normal.  Neck: Normal range of motion.  Cardiovascular: Normal rate and normal heart sounds.        Pulse slow and slightly irregular  Pulmonary/Chest: Effort normal and breath sounds normal.  Abdominal: Bowel sounds are normal.  Musculoskeletal: Normal range of motion. He exhibits no edema and no tenderness.       Right knee with [name] her arthritic changes and deformity  Neurological: He is alert and oriented to person, place, and time.  Psychiatric: He has a normal mood and affect. His behavior is normal.          Assessment & Plan:   Unremarkable health maintenance  examination Advanced osteoarthritis with low back pain and right knee pain Dyslipidemia  Laboratory studies will be reviewed including a uric acid level Return here in one year or when necessary No contraindications to the anticipated orthopedic surgery

## 2012-05-01 NOTE — Patient Instructions (Signed)
Limit your sodium (Salt) intake    It is important that you exercise regularly, at least 20 minutes 3 to 4 times per week.  If you develop chest pain or shortness of breath seek  medical attention.  Return in one year for follow-up   

## 2012-05-13 ENCOUNTER — Other Ambulatory Visit: Payer: Self-pay | Admitting: Specialist

## 2012-05-22 ENCOUNTER — Encounter (HOSPITAL_COMMUNITY): Payer: Self-pay | Admitting: Pharmacy Technician

## 2012-05-23 ENCOUNTER — Other Ambulatory Visit: Payer: Self-pay | Admitting: Physician Assistant

## 2012-05-23 NOTE — H&P (Signed)
CC: right Knee pain HPI:  [name]    With worsening right knee pain secondary to osteoarthritis. Patient has elected to have a total knee arthroplasty to decrease pain and increase function. ZOX:WRUEAVWUJ, skin CA, gout Family History:CVA, COPD Social:non smoker, no etoh, Dr. Amador Cunas Meds:allopurinol, naprosyn, norco Allergies: penicillin, benadryl, erythromycin, vioxx, shellfish, codeine ROS: Pain with ambulation Vitals:128/74 72 12 PE: Alert and appropriate 61 y/o male   In no acute distress Cranial 2-12 grossly intact Cervical spine full rom without pain Lungs: bilateral breath sounds with no wheeze rhonchi or rales Heart: regular rate and rhythm with no murmur Abd: nontender nondistended with active bowel sounds Ext: moderate pain with rom of right knee with crepitus, valgus right knee alignment antalgic gait, neurovascularly intact distally. No pedal edema Skin: no rashes X-rays: endstage osteoarthritis to right knee A/P: endstage osteoarthritis to right knee Plan for total knee arthroplasty to decrease pain and increase function.

## 2012-05-30 ENCOUNTER — Inpatient Hospital Stay (HOSPITAL_COMMUNITY): Admission: RE | Admit: 2012-05-30 | Payer: Managed Care, Other (non HMO) | Source: Ambulatory Visit

## 2012-06-06 ENCOUNTER — Ambulatory Visit (HOSPITAL_COMMUNITY)
Admission: RE | Admit: 2012-06-06 | Payer: Managed Care, Other (non HMO) | Source: Ambulatory Visit | Admitting: Specialist

## 2012-06-06 ENCOUNTER — Encounter (HOSPITAL_COMMUNITY): Admission: RE | Payer: Self-pay | Source: Ambulatory Visit

## 2012-06-06 SURGERY — ARTHROPLASTY, KNEE, TOTAL
Anesthesia: General | Site: Knee | Laterality: Right

## 2012-07-08 ENCOUNTER — Telehealth: Payer: Self-pay | Admitting: Internal Medicine

## 2012-07-08 ENCOUNTER — Other Ambulatory Visit: Payer: Self-pay | Admitting: Internal Medicine

## 2012-07-08 DIAGNOSIS — G8929 Other chronic pain: Secondary | ICD-10-CM

## 2012-07-08 NOTE — Telephone Encounter (Signed)
ok 

## 2012-07-08 NOTE — Telephone Encounter (Signed)
Referral done

## 2012-07-08 NOTE — Telephone Encounter (Signed)
Called in.

## 2012-07-08 NOTE — Telephone Encounter (Signed)
Pt called and is req referral to Rheumatologist in [place], for severe Joint Pain.

## 2012-07-08 NOTE — Telephone Encounter (Signed)
Please advise 

## 2012-07-08 NOTE — Telephone Encounter (Signed)
Last seen 05/01/12 - cpx Last written 10/31/11 # 60 3Rf Looks like knee surg was cx'd last month  Please advise

## 2012-07-08 NOTE — Telephone Encounter (Signed)
Okay for rheumatology referral 

## 2012-07-10 ENCOUNTER — Ambulatory Visit: Payer: Managed Care, Other (non HMO)

## 2012-07-10 ENCOUNTER — Ambulatory Visit (INDEPENDENT_AMBULATORY_CARE_PROVIDER_SITE_OTHER): Payer: 59

## 2012-07-10 DIAGNOSIS — Z23 Encounter for immunization: Secondary | ICD-10-CM

## 2012-09-05 ENCOUNTER — Ambulatory Visit (INDEPENDENT_AMBULATORY_CARE_PROVIDER_SITE_OTHER): Payer: 59 | Admitting: Internal Medicine

## 2012-09-05 ENCOUNTER — Encounter: Payer: Self-pay | Admitting: Internal Medicine

## 2012-09-05 VITALS — BP 90/62 | HR 56 | Temp 98.0°F | Resp 16 | Wt 168.0 lb

## 2012-09-05 DIAGNOSIS — M1990 Unspecified osteoarthritis, unspecified site: Secondary | ICD-10-CM

## 2012-09-05 DIAGNOSIS — E785 Hyperlipidemia, unspecified: Secondary | ICD-10-CM

## 2012-09-05 NOTE — Progress Notes (Signed)
Subjective:    Patient ID: [name] [name], male    DOB: [birth year], 61 y.o.   MRN: [id number]  HPI  61 year old patient who is seen today in followup. He has a history of dyslipidemia which has been well-controlled on statin therapy. His main problem is significant diffuse osteoarthritis. He has been followed by [facility] orthopedics and has been told that he has end-stage bilateral shoulder arthritis and will require surgery (Supple);  he also has advanced arthritis involving both knees the right greater than the left. He actually has surgery planned for earlier this fall ([place]) but patient has postponed.  He has done much better since significant weight loss. His weight is down to 168 from a high of 192.  He states that he terminated his last job position prior to the anticipated knee surgery. At the present time he does not feel he can work full time due to knee and shoulder pain in the expected the near term surgery. He is on multiple medications to assist with his arthritic pain and debility  Past Medical History  Diagnosis Date  . ALLERGIC RHINITIS 04/22/2007  . HYPERCHOLESTEROLEMIA 04/22/2007  . HYPERLIPIDEMIA 07/29/2007  . NEPHROLITHIASIS, HX OF 07/29/2007  . Osteoarth NOS-Unspec 04/22/2007    History   Social History  . Marital Status: Married    Spouse Name: N/A    Number of Children: N/A  . Years of Education: N/A   Occupational History  . Not on file.   Social History Main Topics  . Smoking status: Never Smoker   . Smokeless tobacco: Never Used  . Alcohol Use: No  . Drug Use: No  . Sexually Active: Not on file   Other Topics Concern  . Not on file   Social History Narrative  . No narrative on file    Past Surgical History  Procedure Date  . Appendectomy   . Knee arthroscopy     right  . Shoulder surgery     right    Family History  Problem Relation Age of Onset  . Cancer Neg Hx     family hx -breast and lung Ca  . COPD Neg Hx     family hx     Allergies  Allergen Reactions  . Diphenhydramine Hcl Anaphylaxis and Rash    Only if it has dyes in it  . Shrimp (Shellfish Allergy) Anaphylaxis and Rash  . Amoxicillin Other (See Comments)    Unknown   . Erythromycin Other (See Comments)    unknown  . Betadine (Povidone Iodine) Rash  . Celebrex (Celecoxib) Rash  . Codeine Rash and Other (See Comments)    Blacked out  . Meperidine And Related Rash  . Methocarbamol Rash  . Penicillins Rash  . Vioxx (Rofecoxib) Rash    Current Outpatient Prescriptions on File Prior to Visit  Medication Sig Dispense Refill  . allopurinol (ZYLOPRIM) 100 MG tablet Take 100 mg by mouth daily with breakfast.      . aspirin EC 81 MG tablet Take 81 mg by mouth daily.      Marland Kitchen EPINEPHrine (EPI-PEN) 0.3 mg/0.3 mL DEVI Inject 0.3 mg into the muscle once.      Marland Kitchen glucosamine-chondroitin 500-400 MG tablet Take 2 tablets by mouth daily.      Marland Kitchen HYDROcodone-acetaminophen (NORCO/VICODIN) 5-325 MG per tablet Take 1 tablet by mouth every 6 (six) hours as needed. Pain      . HYDROcodone-acetaminophen (VICODIN) 5-500 MG per tablet TAKE ONE TABLET BY MOUTH EVERY 6  HOURS AS NEEDED FOR PAIN  60 tablet  2  . Multiple Vitamin (MULTIVITAMIN WITH MINERALS) TABS Take 1 tablet by mouth daily.      . naproxen sodium (ANAPROX) 550 MG tablet Take 550 mg by mouth 2 (two) times daily with a meal.      . naproxen sodium (ANAPROX) 550 MG tablet TAKE ONE TABLET BY MOUTH TWICE DAILY WITH A MEAL  180 tablet  0  . pravastatin (PRAVACHOL) 40 MG tablet Take 40 mg by mouth every evening.      . pregabalin (LYRICA) 75 MG capsule Take 75 mg by mouth daily with breakfast.      . psyllium (REGULOID) 0.52 G capsule Take 0.52 g by mouth daily.      . Red Yeast Rice Extract 600 MG CAPS Take 1 capsule by mouth daily.      . vitamin C (ASCORBIC ACID) 500 MG tablet Take 500 mg by mouth daily.        BP 90/62  Pulse 56  Temp 98 F (36.7 C) (Oral)  Resp 16  Wt 168 lb (76.204  kg)       Review of Systems  Constitutional: Negative for fever, chills, appetite change and fatigue.  HENT: Negative for hearing loss, ear pain, congestion, sore throat, trouble swallowing, neck stiffness, dental problem, voice change and tinnitus.   Eyes: Negative for pain, discharge and visual disturbance.  Respiratory: Negative for cough, chest tightness, wheezing and stridor.   Cardiovascular: Negative for chest pain, palpitations and leg swelling.  Gastrointestinal: Negative for nausea, vomiting, abdominal pain, diarrhea, constipation, blood in stool and abdominal distention.  Genitourinary: Negative for urgency, hematuria, flank pain, discharge, difficulty urinating and genital sores.  Musculoskeletal: Positive for back pain, joint swelling and arthralgias. Negative for myalgias and gait problem.  Skin: Negative for rash.  Neurological: Negative for dizziness, syncope, speech difficulty, weakness, numbness and headaches.  Hematological: Negative for adenopathy. Does not bruise/bleed easily.  Psychiatric/Behavioral: Negative for behavioral problems and dysphoric mood. The patient is not nervous/anxious.        Objective:   Physical Exam  Constitutional: He is oriented to person, place, and time. He appears well-developed.  HENT:  Head: Normocephalic.  Right Ear: External ear normal.  Left Ear: External ear normal.  Eyes: Conjunctivae normal and EOM are normal.  Neck: Normal range of motion.  Cardiovascular: Normal rate and normal heart sounds.   Pulmonary/Chest: Breath sounds normal.  Abdominal: Bowel sounds are normal.  Musculoskeletal: Normal range of motion. He exhibits no edema and no tenderness.  Neurological: He is alert and oriented to person, place, and time.  Psychiatric: He has a normal mood and affect. His behavior is normal.          Assessment & Plan:   End-stage arthritis involving both shoulders and knees Dyslipidemia. Continue pravastatin History  of nephrolithiasis stable  The patient requests  disability form completion;  this paperwork will be completed The patient has asked to return in 6 months for his annual exam

## 2012-09-05 NOTE — Patient Instructions (Addendum)
Return in 6 months for follow up

## 2012-09-12 ENCOUNTER — Telehealth: Payer: Self-pay | Admitting: Internal Medicine

## 2012-09-12 NOTE — Telephone Encounter (Signed)
Left message on voicemail.

## 2012-09-12 NOTE — Telephone Encounter (Signed)
Patient came in stating that he is in need of a device that will allow him to sleep flat face down due to his ailments and allow him to still breathe. This is due to his shoulder injury. Please advise.

## 2012-09-13 NOTE — Telephone Encounter (Signed)
Spoke to pt told him I do not know of any device that will allow him to lie face down, need to check with Ortho doctor. Pt verbalized understanding.

## 2012-12-16 ENCOUNTER — Other Ambulatory Visit: Payer: Self-pay | Admitting: Internal Medicine

## 2012-12-23 ENCOUNTER — Ambulatory Visit (INDEPENDENT_AMBULATORY_CARE_PROVIDER_SITE_OTHER): Payer: Managed Care, Other (non HMO) | Admitting: Internal Medicine

## 2012-12-23 ENCOUNTER — Encounter: Payer: Self-pay | Admitting: Internal Medicine

## 2012-12-23 VITALS — BP 100/62 | HR 60 | Temp 98.3°F | Resp 18 | Wt 161.0 lb

## 2012-12-23 DIAGNOSIS — H6120 Impacted cerumen, unspecified ear: Secondary | ICD-10-CM

## 2012-12-23 DIAGNOSIS — H6123 Impacted cerumen, bilateral: Secondary | ICD-10-CM

## 2012-12-23 NOTE — Progress Notes (Signed)
  Subjective:    Patient ID: [name] [name], male    DOB: 20-Dec-1950, 62 y.o.   MRN: [id number]  HPI  62 year old patient who presents with diminished auditory acuity. He has seen another physician and was referred for treatment of cerumen impactions    Review of Systems  HENT: Positive for hearing loss.   Musculoskeletal: Positive for back pain and arthralgias.       Objective:   Physical Exam  Constitutional: He appears well-nourished. No distress.  HENT:  Bilateral cerumen impactions          Assessment & Plan:   Cerumen impactions. Both canals irrigated until clear Advanced osteoarthritis with symptomatic back,  bilateral shoulder and right knee pain

## 2012-12-23 NOTE — Patient Instructions (Addendum)
Call or return to clinic prn if these symptoms worsen or fail to improve as anticipated.

## 2012-12-24 ENCOUNTER — Other Ambulatory Visit: Payer: Managed Care, Other (non HMO)

## 2012-12-31 ENCOUNTER — Encounter: Payer: Managed Care, Other (non HMO) | Admitting: Internal Medicine

## 2013-01-06 ENCOUNTER — Telehealth: Payer: Self-pay | Admitting: Internal Medicine

## 2013-01-06 NOTE — Telephone Encounter (Signed)
[facility] Triage Call Report Triage Record Num: [id number] Operator: Alphonsa Overall Patient Name: [name] [name] Call Date & Time: 01/03/2013 6:31:38PM Patient Phone: [phone] PCP: Gordy Savers Patient Gender: Male PCP Fax : [phone] Patient DOB: [birth year] Practice Name: [facility] Reason for Call: Onalee Hua calling about kidney stone prescription. Pt deals with kidney stones while living in [place] x years and was given a script then. Pt is concerned that needs to be on this medication again, has run out. Office visit 12/23/12 not discussed at that time. Pt declines triage and current symptoms. Pt will f/u with office 01/06/13 about situation. Protocol(s) Used: Office Note Recommended Outcome per Protocol: Information Noted and Sent to Office Reason for Outcome: Caller information to office Care Advice: ~ 04/

## 2013-01-15 ENCOUNTER — Encounter: Payer: Self-pay | Admitting: Internal Medicine

## 2013-01-15 ENCOUNTER — Ambulatory Visit (INDEPENDENT_AMBULATORY_CARE_PROVIDER_SITE_OTHER): Payer: 59 | Admitting: Internal Medicine

## 2013-01-15 VITALS — BP 108/70 | HR 75 | Temp 98.4°F | Resp 18 | Wt 165.0 lb

## 2013-01-15 DIAGNOSIS — M1990 Unspecified osteoarthritis, unspecified site: Secondary | ICD-10-CM

## 2013-01-16 ENCOUNTER — Encounter: Payer: Self-pay | Admitting: Internal Medicine

## 2013-01-16 NOTE — Patient Instructions (Signed)
  Orthopedic follow-up  Call or return to clinic prn if these symptoms worsen or fail to improve as anticipated.  

## 2013-01-16 NOTE — Progress Notes (Signed)
Subjective:    Patient ID: [name] [name], male    DOB: 02/14/1951, 62 y.o.   MRN: [id number]  HPI  62 year old patient who is seen today for followup. Medical problems include mild dyslipidemia but his chief concern is generalized osteoarthritis. This was quite severe and he has been disabled.  He has end-stage disease in both shoulders and both knees. 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 He is followed by Dr. Clarisse Gouge. The physical therapy sessions have been quite painful and to date  of unclear benefit. He is able to perform sedentary activities only. Medical regimen includes anti-inflammatory medications as well as Lyrica and analgesics  Past Medical History  Diagnosis Date  . ALLERGIC RHINITIS 04/22/2007  . HYPERCHOLESTEROLEMIA 04/22/2007  . HYPERLIPIDEMIA 07/29/2007  . NEPHROLITHIASIS, HX OF 07/29/2007  . Osteoarth NOS-Unspec 04/22/2007    History   Social History  . Marital Status: Married    Spouse Name: N/A    Number of Children: N/A  . Years of Education: N/A   Occupational History  . Not on file.   Social History Main Topics  . Smoking status: Never Smoker   . Smokeless tobacco: Never Used  . Alcohol Use: No  . Drug Use: No  . Sexually Active: Not on file   Other Topics Concern  . Not on file   Social History Narrative  . No narrative on file    Past Surgical History  Procedure Laterality Date  . Appendectomy    . Knee arthroscopy      right  . Shoulder surgery      right    Family History  Problem Relation Age of Onset  . Cancer Neg Hx     family hx -breast and lung Ca  . COPD Neg Hx     family hx    Allergies  Allergen Reactions  . Diphenhydramine Hcl Anaphylaxis and Rash    Only if it has dyes in it  . Shrimp (Shellfish Allergy) Anaphylaxis and Rash  . Amoxicillin Other (See Comments)    Unknown   . Erythromycin Other (See Comments)   unknown  . Betadine (Povidone Iodine) Rash  . Celebrex (Celecoxib) Rash  . Codeine Rash and Other (See Comments)    Blacked out  . Meperidine And Related Rash  . Methocarbamol Rash  . Penicillins Rash  . Vioxx (Rofecoxib) Rash    Current Outpatient Prescriptions on File Prior to Visit  Medication Sig Dispense Refill  . allopurinol (ZYLOPRIM) 100 MG tablet Take 100 mg by mouth daily with breakfast.      . aspirin EC 81 MG tablet Take 81 mg by mouth daily.      Marland Kitchen EPINEPHrine (EPI-PEN) 0.3 mg/0.3 mL DEVI Inject 0.3 mg into the muscle once.      Marland Kitchen HYDROcodone-acetaminophen (NORCO/VICODIN) 5-325 MG per tablet Take 1 tablet by mouth every 6 (six) hours as needed. Pain      . Multiple Vitamin (MULTIVITAMIN WITH MINERALS) TABS Take 1 tablet by mouth daily.      . naproxen sodium (ANAPROX) 550 MG tablet TAKE ONE TABLET BY MOUTH TWICE DAILY WITH A MEAL  180 tablet  1  . pravastatin (PRAVACHOL) 40 MG tablet Take 40 mg by mouth every evening.      . pregabalin (LYRICA) 75 MG capsule Take 75 mg by mouth daily with breakfast.      . Red Yeast Rice Extract  600 MG CAPS Take 1 capsule by mouth daily.      . traMADol (ULTRAM) 50 MG tablet Take 50 mg by mouth every 6 (six) hours as needed for pain.      . vitamin C (ASCORBIC ACID) 500 MG tablet Take 500 mg by mouth daily.       No current facility-administered medications on file prior to visit.    BP 108/70  Pulse 75  Temp(Src) 98.4 F (36.9 C) (Oral)  Resp 18  Wt 165 lb (74.844 kg)  BMI 24.55 kg/m2  SpO2 98%       Review of Systems  Musculoskeletal: Positive for back pain, joint swelling and arthralgias.  Neurological: Positive for headaches.       Objective:   Physical Exam  Constitutional: He appears well-developed and well-nourished. No distress.  Musculoskeletal:  Decreased range of motion cervical spine  Psychiatric: He has a normal mood and affect. His behavior is normal.          Assessment & Plan:   Advanced  generalized osteoarthritis with end-stage disease involving both shoulders and both knees. Followup orthopedics. Continue present aggressive medical therapy. The patient will need multiple joint replacement surgeries in the future.

## 2013-03-03 ENCOUNTER — Ambulatory Visit (INDEPENDENT_AMBULATORY_CARE_PROVIDER_SITE_OTHER): Payer: 59 | Admitting: Internal Medicine

## 2013-03-03 ENCOUNTER — Encounter: Payer: Self-pay | Admitting: Internal Medicine

## 2013-03-03 VITALS — BP 102/70 | HR 62 | Temp 98.6°F | Resp 18 | Wt 164.0 lb

## 2013-03-03 DIAGNOSIS — J069 Acute upper respiratory infection, unspecified: Secondary | ICD-10-CM

## 2013-03-03 DIAGNOSIS — M1990 Unspecified osteoarthritis, unspecified site: Secondary | ICD-10-CM

## 2013-03-03 DIAGNOSIS — J309 Allergic rhinitis, unspecified: Secondary | ICD-10-CM

## 2013-03-03 MED ORDER — HYDROCODONE-ACETAMINOPHEN 5-325 MG PO TABS: 1.0000 | ORAL_TABLET | Freq: Four times a day (QID) | ORAL | Status: DC | PRN

## 2013-03-03 NOTE — Patient Instructions (Signed)
Acute bronchitis symptoms for less than 10 days are generally not helped by antibiotics.  Take over-the-counter expectorants and cough medications such as  Mucinex DM.    Use saline irrigation, warm  moist compresses and over-the-counter decongestants only as directed.  Call if there is no improvement in 5 to 7 days, or sooner if you develop increasing pain, fever, or any new symptoms.

## 2013-03-03 NOTE — Progress Notes (Signed)
Subjective:    Patient ID: [name] [name], male    DOB: Apr 30, 1951, 62 y.o.   MRN: [id number]  HPI  62 year old patient who has a history of advanced osteoarthritis. He is seen today with a four-day history of head and chest congestion and cough. He has been self treating with Mucinex D. with some improvement. Denies any fever wheezing or shortness of breath.  Past Medical History  Diagnosis Date  . ALLERGIC RHINITIS 04/22/2007  . HYPERCHOLESTEROLEMIA 04/22/2007  . HYPERLIPIDEMIA 07/29/2007  . NEPHROLITHIASIS, HX OF 07/29/2007  . Osteoarth NOS-Unspec 04/22/2007    History   Social History  . Marital Status: Married    Spouse Name: N/A    Number of Children: N/A  . Years of Education: N/A   Occupational History  . Not on file.   Social History Main Topics  . Smoking status: Never Smoker   . Smokeless tobacco: Never Used  . Alcohol Use: No  . Drug Use: No  . Sexually Active: Not on file   Other Topics Concern  . Not on file   Social History Narrative  . No narrative on file    Past Surgical History  Procedure Laterality Date  . Appendectomy    . Knee arthroscopy      right  . Shoulder surgery      right    Family History  Problem Relation Age of Onset  . Cancer Neg Hx     family hx -breast and lung Ca  . COPD Neg Hx     family hx    Allergies  Allergen Reactions  . Diphenhydramine Hcl Anaphylaxis and Rash    Only if it has dyes in it  . Shrimp (Shellfish Allergy) Anaphylaxis and Rash  . Amoxicillin Other (See Comments)    Unknown   . Erythromycin Other (See Comments)    unknown  . Betadine (Povidone Iodine) Rash  . Celebrex (Celecoxib) Rash  . Codeine Rash and Other (See Comments)    Blacked out  . Meperidine And Related Rash  . Methocarbamol Rash  . Penicillins Rash  . Vioxx (Rofecoxib) Rash    Current Outpatient Prescriptions on File Prior to Visit  Medication Sig Dispense Refill  . allopurinol (ZYLOPRIM) 100 MG tablet Take 100 mg by mouth  daily with breakfast.      . aspirin EC 81 MG tablet Take 81 mg by mouth daily.      . diazepam (VALIUM) 5 MG tablet Take 5 mg by mouth as needed for anxiety.      Marland Kitchen EPINEPHrine (EPI-PEN) 0.3 mg/0.3 mL DEVI Inject 0.3 mg into the muscle once.      . fish oil-omega-3 fatty acids 1000 MG capsule Take 1 g by mouth daily.      Marland Kitchen HYDROcodone-acetaminophen (NORCO/VICODIN) 5-325 MG per tablet Take 1 tablet by mouth every 6 (six) hours as needed. Pain      . Multiple Vitamin (MULTIVITAMIN WITH MINERALS) TABS Take 1 tablet by mouth daily.      . naproxen sodium (ANAPROX) 550 MG tablet TAKE ONE TABLET BY MOUTH TWICE DAILY WITH A MEAL  180 tablet  1  . oxyCODONE-acetaminophen (PERCOCET) 10-325 MG per tablet Take 1 tablet by mouth every 4 (four) hours as needed for pain.      . pravastatin (PRAVACHOL) 40 MG tablet Take 40 mg by mouth every evening.      . pregabalin (LYRICA) 75 MG capsule Take 75 mg by mouth daily with breakfast.      .  Red Yeast Rice Extract 600 MG CAPS Take 1 capsule by mouth daily.      . traMADol (ULTRAM) 50 MG tablet Take 50 mg by mouth every 6 (six) hours as needed for pain.      . vitamin C (ASCORBIC ACID) 500 MG tablet Take 500 mg by mouth daily.       No current facility-administered medications on file prior to visit.    BP 102/70  Pulse 62  Temp(Src) 98.6 F (37 C) (Oral)  Resp 18  Wt 164 lb (74.39 kg)  BMI 24.4 kg/m2  SpO2 97%       Review of Systems  Constitutional: Positive for fatigue. Negative for fever, chills and appetite change.  HENT: Positive for congestion and rhinorrhea. Negative for hearing loss, ear pain, sore throat, trouble swallowing, neck stiffness, dental problem, voice change and tinnitus.   Eyes: Negative for pain, discharge and visual disturbance.  Respiratory: Positive for cough. Negative for chest tightness, wheezing and stridor.   Cardiovascular: Negative for chest pain, palpitations and leg swelling.  Gastrointestinal: Negative for  nausea, vomiting, abdominal pain, diarrhea, constipation, blood in stool and abdominal distention.  Genitourinary: Negative for urgency, hematuria, flank pain, discharge, difficulty urinating and genital sores.  Musculoskeletal: Negative for myalgias, back pain, joint swelling, arthralgias and gait problem.  Skin: Negative for rash.  Neurological: Positive for weakness. Negative for dizziness, syncope, speech difficulty, numbness and headaches.  Hematological: Negative for adenopathy. Does not bruise/bleed easily.  Psychiatric/Behavioral: Negative for behavioral problems and dysphoric mood. The patient is not nervous/anxious.        Objective:   Physical Exam  Constitutional: He is oriented to person, place, and time. He appears well-developed.  HENT:  Head: Normocephalic.  Right Ear: External ear normal.  Left Ear: External ear normal.  Eyes: Conjunctivae and EOM are normal.  Neck: Normal range of motion.  Cardiovascular: Normal rate and normal heart sounds.   Pulmonary/Chest: Breath sounds normal.  Abdominal: Bowel sounds are normal.  Musculoskeletal: Normal range of motion. He exhibits no edema and no tenderness.  Neurological: He is alert and oriented to person, place, and time.  Psychiatric: He has a normal mood and affect. His behavior is normal.          Assessment & Plan:   Viral URI with cough. We'll continue symptomatic treatment Advanced osteoarthritis.  Disability forms to be completed  Return when necessary

## 2013-05-19 ENCOUNTER — Other Ambulatory Visit: Payer: Self-pay | Admitting: Internal Medicine

## 2013-06-04 ENCOUNTER — Ambulatory Visit
Admission: RE | Admit: 2013-06-04 | Discharge: 2013-06-04 | Disposition: A | Discharge status: Home / Self Care | Payer: 59 | Source: Ambulatory Visit | Attending: Neurology | Admitting: Neurology

## 2013-06-04 ENCOUNTER — Other Ambulatory Visit: Payer: Self-pay | Admitting: Neurology

## 2013-06-04 ENCOUNTER — Other Ambulatory Visit: Payer: Self-pay | Admitting: Internal Medicine

## 2013-06-04 DIAGNOSIS — M542 Cervicalgia: Secondary | ICD-10-CM

## 2013-06-04 DIAGNOSIS — R519 Headache, unspecified: Secondary | ICD-10-CM

## 2013-06-10 ENCOUNTER — Ambulatory Visit (INDEPENDENT_AMBULATORY_CARE_PROVIDER_SITE_OTHER): Payer: 59

## 2013-06-10 DIAGNOSIS — Z23 Encounter for immunization: Secondary | ICD-10-CM

## 2013-06-24 ENCOUNTER — Other Ambulatory Visit: Payer: Self-pay | Admitting: *Deleted

## 2013-06-24 MED ORDER — PRAVASTATIN SODIUM 40 MG PO TABS: ORAL_TABLET | ORAL | Status: DC

## 2013-06-24 MED ORDER — ALLOPURINOL 100 MG PO TABS: 100.0000 mg | ORAL_TABLET | Freq: Every day | ORAL | Status: DC

## 2013-07-16 ENCOUNTER — Other Ambulatory Visit (INDEPENDENT_AMBULATORY_CARE_PROVIDER_SITE_OTHER): Payer: 59

## 2013-07-16 DIAGNOSIS — Z0000 Encounter for general adult medical examination without abnormal findings: Secondary | ICD-10-CM

## 2013-07-16 LAB — POCT URINALYSIS DIPSTICK
Leukocytes, UA: NEGATIVE
Nitrite, UA: NEGATIVE
Protein, UA: NEGATIVE
Urobilinogen, UA: 0.2

## 2013-07-16 LAB — BASIC METABOLIC PANEL
BUN: 20 mg/dL (ref 6–23)
CO2: 33 mEq/L — ABNORMAL HIGH (ref 19–32)
Calcium: 9.8 mg/dL (ref 8.4–10.5)
Creatinine, Ser: 1.1 mg/dL (ref 0.4–1.5)
Glucose, Bld: 97 mg/dL (ref 70–99)
Sodium: 141 mEq/L (ref 135–145)

## 2013-07-16 LAB — CBC WITH DIFFERENTIAL/PLATELET
Basophils Absolute: 0 10*3/uL (ref 0.0–0.1)
Eosinophils Absolute: 0.3 10*3/uL (ref 0.0–0.7)
Hemoglobin: 16.5 g/dL (ref 13.0–17.0)
Lymphocytes Relative: 34.8 % (ref 12.0–46.0)
MCHC: 34.6 g/dL (ref 30.0–36.0)
Neutro Abs: 2.6 10*3/uL (ref 1.4–7.7)
RDW: 13.3 % (ref 11.5–14.6)

## 2013-07-16 LAB — HEPATIC FUNCTION PANEL
Albumin: 3.9 g/dL (ref 3.5–5.2)
Alkaline Phosphatase: 38 U/L — ABNORMAL LOW (ref 39–117)

## 2013-07-16 LAB — TSH: TSH: 1.85 u[IU]/mL (ref 0.35–5.50)

## 2013-07-16 LAB — LIPID PANEL: Cholesterol: 232 mg/dL — ABNORMAL HIGH (ref 0–200)

## 2013-07-17 DIAGNOSIS — Z0279 Encounter for issue of other medical certificate: Secondary | ICD-10-CM

## 2013-07-22 ENCOUNTER — Ambulatory Visit (INDEPENDENT_AMBULATORY_CARE_PROVIDER_SITE_OTHER): Payer: 59 | Admitting: Internal Medicine

## 2013-07-22 ENCOUNTER — Encounter: Payer: Self-pay | Admitting: Internal Medicine

## 2013-07-22 VITALS — BP 106/70 | HR 63 | Temp 98.2°F | Resp 20 | Ht 68.5 in | Wt 175.0 lb

## 2013-07-22 DIAGNOSIS — Z87442 Personal history of urinary calculi: Secondary | ICD-10-CM

## 2013-07-22 DIAGNOSIS — M1990 Unspecified osteoarthritis, unspecified site: Secondary | ICD-10-CM

## 2013-07-22 DIAGNOSIS — E7800 Pure hypercholesterolemia, unspecified: Secondary | ICD-10-CM

## 2013-07-22 DIAGNOSIS — E785 Hyperlipidemia, unspecified: Secondary | ICD-10-CM

## 2013-07-22 DIAGNOSIS — Z0000 Encounter for general adult medical examination without abnormal findings: Secondary | ICD-10-CM

## 2013-07-22 MED ORDER — SUMATRIPTAN SUCCINATE 50 MG PO TABS: 100.0000 mg | ORAL_TABLET | ORAL | Status: DC | PRN

## 2013-07-22 MED ORDER — DIAZEPAM 5 MG PO TABS: 5.0000 mg | ORAL_TABLET | ORAL | Status: DC | PRN

## 2013-07-22 NOTE — Progress Notes (Signed)
Patient ID: [name] [name], male   DOB: 06/05/1951, 62 y.o.   MRN: [id number]  Subjective:    Patient ID: [name] [name], male    DOB: 1950-12-29, 62 y.o.   MRN: [id number]  HPI31  -year-old patient who is seen today for a health maintenance examination. He is doing quite well except for generalized osteoarthritic complaints. He is followed closely by orthopedics. 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 He is now on allopurinol. He has dyslipidemia controlled on pravastatin. He continues to tolerate this well. His only complaints are orthopedic. He is scheduled for orthopedic followup and  right total knee replacement surgery in December.  He is doing quite well clinically except for arthritic concerns and denies any cardiopulmonary complaints. He is quite active physically without exercise restrictions and limited only by advanced osteoarthritis.  He has considerable pain in the right knee and both shoulder areas  Wt Readings from Last 3 Encounters:  07/22/13 175 lb (79.379 kg)  03/03/13 164 lb (74.39 kg)  01/15/13 165 lb (74.844 kg)  y  Problems Prior to Update:  1) Back Pain (ICD-724.5)  2) Osteoarthritis (ICD-715.90)  3) Nephrolithiasis, Hx of (ICD-V13.01)  4) Hyperlipidemia (ICD-272.4)  5) Health Maintenance Exam (ICD-V70.0)  6) Allergic Rhinitis (ICD-477.9)  7) Hypercholesterolemia (ICD-272.0)  8) Degenerative Joint Disease (ICD-715.90)  9) Family History of Melanoma (ICD-V16.8)   Allergies:  1) ! Pcn  2) ! Erythromycin  3) ! Amoxicillin  4) ! Benadryl  5) ! Codeine   Past History:  Past Medical History:   Allergic rhinitis,seasonal  Hyperlipidemia  Nephrolithiasis, hx of colic  history of spinal stenosis  Osteoarthritis  Back Pain 2008   Past Surgical History:  Colonoscopy-09/27/2006  Appendectomy  right knee arthroscopic surgery  9-12  right shoulder surgery, August 2004  was in the extraction November 2003   Family  History:  Reviewed history from 07/29/2007 and no changes required.  Family History Lung cancer  Family History Other cancer-Breast  Family History of Melanoma  Fam hx COPD  father history of COPD, glucose intolerance; PSVT  mother history of breast cancer and melanoma died October 10, 2012Two brothers one sister in good health-obesity  Maternal grandfather lung cancer   Social History:  Reviewed history from 07/29/2007 and no changes required.  Never Smoked  Married  one daughter Married 02-2009    Review of Systems  Constitutional: Negative for fever, chills, activity change, appetite change and fatigue.  HENT: Negative for congestion, dental problem, ear pain, hearing loss, mouth sores, rhinorrhea, sinus pressure, sneezing, tinnitus, trouble swallowing and voice change.   Eyes: Negative for photophobia, pain, redness and visual disturbance.  Respiratory: Negative for apnea, cough, choking, chest tightness, shortness of breath and wheezing.   Cardiovascular: Negative for chest pain, palpitations and leg swelling.  Gastrointestinal: Negative for nausea, vomiting, abdominal pain, diarrhea, constipation, blood in stool, abdominal distention, anal bleeding and rectal pain.  Genitourinary: Negative for dysuria, urgency, frequency, hematuria, flank pain, decreased urine volume, discharge, penile swelling, scrotal swelling, difficulty urinating, genital sores and testicular pain.  Musculoskeletal: Positive for back pain, gait problem and joint swelling. Negative for arthralgias, myalgias, neck pain and neck stiffness.  Skin: Negative for color change, rash and wound.  Neurological: Negative for dizziness, tremors, seizures, syncope, facial asymmetry, speech difficulty, weakness, light-headedness, numbness and headaches.  Hematological: Negative for adenopathy. Does not bruise/bleed easily.  Psychiatric/Behavioral: Negative for suicidal ideas, hallucinations, behavioral problems, confusion,  sleep disturbance,  self-injury, dysphoric mood, decreased concentration and agitation. The patient is not nervous/anxious.        Objective:   Physical Exam  Constitutional: He is oriented to person, place, and time. He appears well-developed.  HENT:  Head: Normocephalic.  Right Ear: External ear normal.  Left Ear: External ear normal.  Eyes: Conjunctivae and EOM are normal.  Neck: Normal range of motion.  Cardiovascular: Normal rate and normal heart sounds.   Pulse slow and slightly irregular  Pulmonary/Chest: Effort normal and breath sounds normal.  Abdominal: Bowel sounds are normal.  Genitourinary: Rectum normal, prostate normal and penis normal. Guaiac negative stool.  Musculoskeletal: Normal range of motion. He exhibits no edema and no tenderness.  Right knee with hypertrophic changes and valgus deformity  Neurological: He is alert and oriented to person, place, and time.  Psychiatric: He has a normal mood and affect. His behavior is normal.          Assessment & Plan:   Unremarkable health maintenance  examination Advanced osteoarthritis with low back pain and right knee pain Dyslipidemia  Laboratory studies will be reviewed including a uric acid level Return here in one year or when necessary No contraindications to the anticipated orthopedic surgery

## 2013-07-22 NOTE — Patient Instructions (Signed)
Limit your sodium (Salt) intake  Please check your blood pressure on a regular basis.  If it is consistently greater than 150/90, please make an office appointment.  Return in one year for follow-up  

## 2013-08-11 ENCOUNTER — Encounter (HOSPITAL_COMMUNITY): Payer: Self-pay | Admitting: Pharmacy Technician

## 2013-08-12 ENCOUNTER — Other Ambulatory Visit (HOSPITAL_COMMUNITY): Payer: Self-pay | Admitting: *Deleted

## 2013-08-12 NOTE — Progress Notes (Signed)
NEED ORDERS PLEASE - pt coming for preop TUES 08/13/13 - thank you

## 2013-08-12 NOTE — Patient Instructions (Addendum)
CRISTON CHANCELLOR  08/12/2013                           YOUR PROCEDURE IS SCHEDULED ON: 08/25/13               PLEASE REPORT TO SHORT STAY CENTER AT : 10:45 AM               CALL THIS NUMBER IF ANY PROBLEMS THE DAY OF SURGERY :               [phone]                      REMEMBER:   Do not eat food or drink liquids AFTER MIDNIGHT  May have clear liquids UNTIL 6 HOURS BEFORE SURGERY (7:45 AM)  Clear liquids include soda, tea, black coffee, apple or grape juice, broth.  Take these medicines the morning of surgery with A SIP OF WATER:  ALLOPURINOL / LYRICA / VALTREX   Do not wear jewelry, make-up   Do not wear lotions, powders, or perfumes.   Do not shave legs or underarms 12 hrs. before surgery (men may shave face)  Do not bring valuables to the hospital.  Contacts, dentures or bridgework may not be worn into surgery.  Leave suitcase in the car. After surgery it may be brought to your room.  For patients admitted to the hospital more than one night, checkout time is 11:00                          The day of discharge.   Patients discharged the day of surgery will not be allowed to drive home                             If going home same day of surgery, must have someone stay with you first                           24 hrs at home and arrange for some one to drive you home from hospital.    Special Instructions:   Please read over the following fact sheets that you were given:               1. MRSA  INFORMATION                      2. [place] PREPARING FOR SURGERY SHEET               3. INCENTIVE SPIROMETER                                                X_____________________________________________________________________        Failure to follow these instructions may result in cancellation of your surgery

## 2013-08-13 ENCOUNTER — Encounter (HOSPITAL_COMMUNITY)
Admission: RE | Admit: 2013-08-13 | Discharge: 2013-08-13 | Disposition: A | Discharge status: Home / Self Care | Payer: 59 | Source: Ambulatory Visit | Attending: Orthopedic Surgery | Admitting: Orthopedic Surgery

## 2013-08-13 ENCOUNTER — Encounter (HOSPITAL_COMMUNITY): Payer: Self-pay

## 2013-08-13 DIAGNOSIS — Z01812 Encounter for preprocedural laboratory examination: Secondary | ICD-10-CM | POA: Insufficient documentation

## 2013-08-13 DIAGNOSIS — Z01818 Encounter for other preprocedural examination: Secondary | ICD-10-CM | POA: Insufficient documentation

## 2013-08-13 HISTORY — DX: Other complications of anesthesia, initial encounter: T88.59XA

## 2013-08-13 HISTORY — DX: Gastro-esophageal reflux disease without esophagitis: K21.9

## 2013-08-13 HISTORY — DX: Personal history of other malignant neoplasm of skin: Z85.828

## 2013-08-13 HISTORY — DX: Other cervical disc degeneration, unspecified cervical region: M50.30

## 2013-08-13 HISTORY — DX: Pain in unspecified shoulder: M25.519

## 2013-08-13 HISTORY — DX: Adverse effect of unspecified anesthetic, initial encounter: T41.45XA

## 2013-08-13 HISTORY — DX: Personal history of urinary calculi: Z87.442

## 2013-08-13 HISTORY — DX: Headache: R51

## 2013-08-13 LAB — COMPREHENSIVE METABOLIC PANEL
AST: 43 U/L — ABNORMAL HIGH (ref 0–37)
Albumin: 3.7 g/dL (ref 3.5–5.2)
Alkaline Phosphatase: 35 U/L — ABNORMAL LOW (ref 39–117)
BUN: 26 mg/dL — ABNORMAL HIGH (ref 6–23)
Chloride: 99 mEq/L (ref 96–112)
Potassium: 4.3 mEq/L (ref 3.5–5.1)
Sodium: 135 mEq/L (ref 135–145)
Total Protein: 7.3 g/dL (ref 6.0–8.3)

## 2013-08-13 LAB — URINALYSIS, ROUTINE W REFLEX MICROSCOPIC
Bilirubin Urine: NEGATIVE
Glucose, UA: NEGATIVE mg/dL
Leukocytes, UA: NEGATIVE
Nitrite: NEGATIVE
Specific Gravity, Urine: 1.021 (ref 1.005–1.030)
pH: 7.5 (ref 5.0–8.0)

## 2013-08-13 LAB — SURGICAL PCR SCREEN: Staphylococcus aureus: NEGATIVE

## 2013-08-13 LAB — APTT: aPTT: 26 seconds (ref 24–37)

## 2013-08-13 LAB — CBC
HCT: 44.6 % (ref 39.0–52.0)
Hemoglobin: 15.7 g/dL (ref 13.0–17.0)
MCHC: 35.2 g/dL (ref 30.0–36.0)
Platelets: 218 10*3/uL (ref 150–400)
RBC: 4.56 MIL/uL (ref 4.22–5.81)
RDW: 12.9 % (ref 11.5–15.5)

## 2013-08-13 LAB — PROTIME-INR
INR: 0.93 (ref 0.00–1.49)
Prothrombin Time: 12.3 seconds (ref 11.6–15.2)

## 2013-08-13 NOTE — Progress Notes (Signed)
08/13/13 0930  OBSTRUCTIVE SLEEP APNEA  Have you ever been diagnosed with sleep apnea through a sleep study? No  Do you snore loudly (loud enough to be heard through closed doors)?  1  Do you often feel tired, fatigued, or sleepy during the daytime? 1  Has anyone observed you stop breathing during your sleep? 0  Do you have, or are you being treated for high blood pressure? 0  BMI more than 35 kg/m2? 0  Age over 62 years old? 1  Neck circumference greater than 40 cm/18 inches? 0  Gender: 1  Obstructive Sleep Apnea Score 4  Score 4 or greater  Results sent to PCP

## 2013-08-14 ENCOUNTER — Other Ambulatory Visit: Payer: Self-pay | Admitting: Orthopedic Surgery

## 2013-08-14 NOTE — Progress Notes (Signed)
Preoperative surgical orders have been place into the Epic hospital system for [name] [name] on 08/14/2013, 10:26 PM  by Patrica Duel for surgery on 08/25/13.  Preop Total Knee orders including Experal, PO Tylenol, and IV Decadron as long as there are no contraindications to the above medications. Avel Peace, PA-C

## 2013-08-15 ENCOUNTER — Telehealth: Payer: Self-pay | Admitting: Internal Medicine

## 2013-08-15 MED ORDER — SUMATRIPTAN SUCCINATE 100 MG PO TABS: 100.0000 mg | ORAL_TABLET | ORAL | Status: DC | PRN

## 2013-08-15 NOTE — Telephone Encounter (Signed)
Pt needs new rx sent to [facility] sumatriptan 100 mg #9 with refills

## 2013-08-15 NOTE — Telephone Encounter (Signed)
Pt notified Rx was sent to pharmacy. Rx faxed to pharmacy.

## 2013-08-19 ENCOUNTER — Other Ambulatory Visit: Payer: Self-pay | Admitting: Orthopedic Surgery

## 2013-08-24 ENCOUNTER — Other Ambulatory Visit: Payer: Self-pay | Admitting: Orthopedic Surgery

## 2013-08-24 NOTE — H&P (Signed)
[name] [name]  DOB: 09/29/1950 Married / Language: English / Race: White Male  Date of Admission:  08/25/2013  Chief Complaint:  Left Knee Pain  History of Present Illness The patient is a 62 year old male who comes in for a preoperative History and Physical. The patient is scheduled for a right total knee arthroplasty to be performed by Dr. Frank V. Aluisio, MD at [facility] on 08/25/2013. The patient is a 62 year old male who presents for follow up of their knee. The patient is being followed for their right knee pain and osteoarthritis. Symptoms reported today include: pain, swelling, stiffness and difficulty ambulating. The patient feels that they are doing poorly and report their pain level to be moderate to severe. Current treatment includes: home exercise program. The following medication has been used for pain control: none. The patient indicates that they have questions or concerns today regarding surgery. He is scheduled for total knee on December 1st.. Adren states that his knee is getting progressively worse. It is now bothering him at all times. It is definitely limiting what he can and can not do. We had discussed surgery a while back and he was not quite ready. He is at a stage now however where he feels its his only option. He is ready to proceed. They have been treated conservatively in the past for the above stated problem and despite conservative measures, they continue to have progressive pain and severe functional limitations and dysfunction. They have failed non-operative management including home exercise, medications, and injections. It is felt that they would benefit from undergoing total joint replacement. Risks and benefits of the procedure have been discussed with the patient and they elect to proceed with surgery. There are no active contraindications to surgery such as ongoing infection or rapidly progressive neurological disease.   Problem  List Primary osteoarthritis of one knee (715.16)    Allergies Shellfish. shrimp only Penicillin G Sodium *PENICILLINS* Erythromycin *MACROLIDES* Amoxicillin *PENICILLINS* Vioxx *ANALGESICS - ANTI-INFLAMMATORY* Codeine Sulfate *ANALGESICS - OPIOID* Benadryl *ANTIHISTAMINES*    Family History Other medical problems. Father has bad Emphysema Cancer. First Degree Relatives. mother Cerebrovascular Accident. mother    Social History Tobacco use. Never smoker. never smoker Living situation. live with spouse Tobacco / smoke exposure. no Pain Contract. no Previously in rehab. no Most recent primary occupation. [employer] Number of flights of stairs before winded. greater than 5 Current work status. working full time Marital status. married Exercise. Exercises daily; does other Illicit drug use. no Alcohol use. current drinker; only occasionally per week Children. 1 Drug/Alcohol Rehab (Currently). no Advance Directives. Living Will Post-Surgical Plans. Plan is for home.    Past Surgical History Rotator Cuff Repair. right Appendectomy Arthroscopy of Knee. right Arthroscopy of Shoulder. right    Medical History Chronic Pain Kidney Stone Skin Cancer Gout Hypercholesterolemia Gastroesophageal Reflux Disease Migraine Headache Osteoarthritis Acquired spondylolisthesis (738.4)   Review of Systems General:Not Present- Chills, Fever, Night Sweats, Fatigue, Weight Gain, Weight Loss and Memory Loss. Skin:Not Present- Hives, Itching, Rash, Eczema and Lesions. HEENT:Not Present- Tinnitus, Headache, Double Vision, Visual Loss, Hearing Loss and Dentures. Respiratory:Not Present- Shortness of breath with exertion, Shortness of breath at rest, Allergies, Coughing up blood and Chronic Cough. Cardiovascular:Not Present- Chest Pain, Racing/skipping heartbeats, Difficulty Breathing Lying Down, Murmur, Swelling and  Palpitations. Gastrointestinal:Not Present- Bloody Stool, Heartburn, Abdominal Pain, Vomiting, Nausea, Constipation, Diarrhea, Difficulty Swallowing, Jaundice and Loss of appetitie. Male Genitourinary:Not Present- Urinary frequency, Blood in Urine, Weak urinary stream,   Discharge, Flank Pain, Incontinence, Painful Urination, Urgency, Urinary Retention and Urinating at Night. Musculoskeletal:Present- Muscle Weakness, Joint Swelling, Joint Pain, Back Pain and Spasms. Not Present- Muscle Pain and Morning Stiffness. Neurological:Not Present- Tremor, Dizziness, Blackout spells, Paralysis, Difficulty with balance and Weakness. Psychiatric:Not Present- Insomnia.    Vitals Weight: 175 lb Height: 69 in Weight was reported by patient. Body Surface Area: 1.97 m Body Mass Index: 25.84 kg/m Pulse: 60 (Regular) Resp.: 12 (Unlabored) BP: 128/82 (Sitting, Right Arm, Standard)     Physical Exam The physical exam findings are as follows:   General Mental Status - Alert, cooperative and good historian. General Appearance- pleasant. Not in acute distress. Orientation- Oriented X3. Build & Nutrition- Well nourished and Well developed.   Head and Neck Head- normocephalic, atraumatic . Neck Global Assessment- supple. no bruit auscultated on the right and no bruit auscultated on the left.   Eye Pupil- Bilateral- Regular and Round. Motion- Bilateral- EOMI.   Chest and Lung Exam Auscultation: Breath sounds:- clear at anterior chest wall and - clear at posterior chest wall. Adventitious sounds:- No Adventitious sounds.   Cardiovascular Auscultation:Rhythm- Regular rate and rhythm. Heart Sounds- S1 WNL and S2 WNL. Murmurs & Other Heart Sounds:Auscultation of the heart reveals - No Murmurs.   Abdomen Palpation/Percussion:Tenderness- Abdomen is non-tender to palpation. Rigidity (guarding)- Abdomen is soft. Auscultation:Auscultation of the abdomen  reveals - Bowel sounds normal.   Male Genitourinary Not done, not pertinent to present illness  Musculoskeletal Well developed male alert and oriented in no apparent distress. Evaluation of his right knee shows no effusion. His range is about 5 to 125 degrees. He has a significant varus deformity. He is tender medially. There is no lateral tenderness or instability noted. Pulses, sensation and motor are intact. Left knee and hip exam unremarkable.  RADIOGRAPHS: Radiographs, AP both knees and lateral of the right, showing severe bone on bone arthritis in the medial and patellofemoral compartments with varus deformity.   Assessment & Plan Primary osteoarthritis of one knee (715.16) Impression: Right Knee  Note: Plan is for a Right Total Knee Replacement by Dr. Aluisio.  Plan is to go home.  PCP - Dr. Kwiatkowski - Patient has been seen preoperatively and felt to be stable for surgery.  The patient does not have any contraindications and will receive TXA (tranexamic acid) prior to surgery.  Signed electronically by Alexzandrew L Perkins, III PA-C  

## 2013-08-25 ENCOUNTER — Encounter (HOSPITAL_COMMUNITY): Payer: Self-pay | Admitting: *Deleted

## 2013-08-25 ENCOUNTER — Encounter (HOSPITAL_COMMUNITY): Payer: 59 | Admitting: Anesthesiology

## 2013-08-25 ENCOUNTER — Encounter (HOSPITAL_COMMUNITY)
Admission: RE | Disposition: A | Discharge status: Home Health | Payer: Self-pay | Source: Ambulatory Visit | Attending: Orthopedic Surgery

## 2013-08-25 ENCOUNTER — Inpatient Hospital Stay (HOSPITAL_COMMUNITY)
Admission: RE | Admit: 2013-08-25 | Discharge: 2013-08-27 | DRG: 470 | Disposition: A | Discharge status: Home Health | Payer: 59 | Source: Ambulatory Visit | Attending: Orthopedic Surgery | Admitting: Orthopedic Surgery

## 2013-08-25 ENCOUNTER — Inpatient Hospital Stay (HOSPITAL_COMMUNITY): Payer: 59 | Admitting: Anesthesiology

## 2013-08-25 DIAGNOSIS — E785 Hyperlipidemia, unspecified: Secondary | ICD-10-CM | POA: Diagnosis present

## 2013-08-25 DIAGNOSIS — M5030 Other cervical disc degeneration, unspecified cervical region: Secondary | ICD-10-CM | POA: Diagnosis present

## 2013-08-25 DIAGNOSIS — Z96651 Presence of right artificial knee joint: Secondary | ICD-10-CM

## 2013-08-25 DIAGNOSIS — E7800 Pure hypercholesterolemia, unspecified: Secondary | ICD-10-CM | POA: Diagnosis present

## 2013-08-25 DIAGNOSIS — M1710 Unilateral primary osteoarthritis, unspecified knee: Principal | ICD-10-CM | POA: Diagnosis present

## 2013-08-25 DIAGNOSIS — M179 Osteoarthritis of knee, unspecified: Secondary | ICD-10-CM | POA: Diagnosis present

## 2013-08-25 DIAGNOSIS — K219 Gastro-esophageal reflux disease without esophagitis: Secondary | ICD-10-CM | POA: Diagnosis present

## 2013-08-25 DIAGNOSIS — Z87442 Personal history of urinary calculi: Secondary | ICD-10-CM

## 2013-08-25 HISTORY — PX: TOTAL KNEE ARTHROPLASTY: SHX125

## 2013-08-25 LAB — ABO/RH: ABO/RH(D): A POS

## 2013-08-25 LAB — TYPE AND SCREEN

## 2013-08-25 SURGERY — ARTHROPLASTY, KNEE, TOTAL
Anesthesia: Spinal | Site: Knee | Laterality: Right | Wound class: Clean

## 2013-08-25 MED ORDER — BUPIVACAINE HCL (PF) 0.25 % IJ SOLN
INTRAMUSCULAR | Status: AC
  Filled 2013-08-25: qty 30

## 2013-08-25 MED ORDER — TRAMADOL HCL 50 MG PO TABS
50.0000 mg | ORAL_TABLET | Freq: Four times a day (QID) | ORAL | Status: DC | PRN
  Administered 2013-08-27: 100 mg via ORAL
  Filled 2013-08-25: qty 2

## 2013-08-25 MED ORDER — BUPIVACAINE LIPOSOME 1.3 % IJ SUSP
20.0000 mL | Freq: Once | INTRAMUSCULAR | Status: DC
  Filled 2013-08-25: qty 20

## 2013-08-25 MED ORDER — ONDANSETRON HCL 4 MG/2ML IJ SOLN: 4.0000 mg | Freq: Four times a day (QID) | INTRAMUSCULAR | Status: DC | PRN

## 2013-08-25 MED ORDER — DEXAMETHASONE SODIUM PHOSPHATE 10 MG/ML IJ SOLN
10.0000 mg | Freq: Once | INTRAMUSCULAR | Status: AC
  Administered 2013-08-25: 10 mg via INTRAVENOUS

## 2013-08-25 MED ORDER — ACETAMINOPHEN 325 MG PO TABS: 650.0000 mg | ORAL_TABLET | Freq: Four times a day (QID) | ORAL | Status: DC | PRN

## 2013-08-25 MED ORDER — KETAMINE HCL 50 MG/ML IJ SOLN
INTRAMUSCULAR | Status: AC
  Filled 2013-08-25: qty 10

## 2013-08-25 MED ORDER — DOCUSATE SODIUM 100 MG PO CAPS
100.0000 mg | ORAL_CAPSULE | Freq: Two times a day (BID) | ORAL | Status: DC
  Administered 2013-08-25 – 2013-08-27 (×4): 100 mg via ORAL

## 2013-08-25 MED ORDER — BUPIVACAINE LIPOSOME 1.3 % IJ SUSP
INTRAMUSCULAR | Status: DC | PRN
  Administered 2013-08-25: 20 mL

## 2013-08-25 MED ORDER — CYCLOBENZAPRINE HCL 10 MG PO TABS
10.0000 mg | ORAL_TABLET | Freq: Three times a day (TID) | ORAL | Status: DC | PRN
  Administered 2013-08-25 – 2013-08-27 (×7): 10 mg via ORAL
  Filled 2013-08-25 (×7): qty 1

## 2013-08-25 MED ORDER — SUMATRIPTAN SUCCINATE 100 MG PO TABS
100.0000 mg | ORAL_TABLET | ORAL | Status: DC | PRN
  Filled 2013-08-25: qty 1

## 2013-08-25 MED ORDER — BISACODYL 10 MG RE SUPP: 10.0000 mg | Freq: Every day | RECTAL | Status: DC | PRN

## 2013-08-25 MED ORDER — PHENOL 1.4 % MT LIQD
1.0000 | OROMUCOSAL | Status: DC | PRN
  Filled 2013-08-25: qty 177

## 2013-08-25 MED ORDER — SODIUM CHLORIDE 0.9 % IR SOLN
Status: DC | PRN
  Administered 2013-08-25: 300 mL

## 2013-08-25 MED ORDER — SODIUM CHLORIDE 0.9 % IJ SOLN
INTRAMUSCULAR | Status: AC
  Filled 2013-08-25: qty 50

## 2013-08-25 MED ORDER — DEXAMETHASONE SODIUM PHOSPHATE 10 MG/ML IJ SOLN
10.0000 mg | Freq: Every day | INTRAMUSCULAR | Status: AC
  Administered 2013-08-26: 10 mg via INTRAVENOUS
  Filled 2013-08-25: qty 1

## 2013-08-25 MED ORDER — 0.9 % SODIUM CHLORIDE (POUR BTL) OPTIME
TOPICAL | Status: DC | PRN
  Administered 2013-08-25: 200 mL

## 2013-08-25 MED ORDER — BUPIVACAINE HCL 0.25 % IJ SOLN
INTRAMUSCULAR | Status: DC | PRN
  Administered 2013-08-25: 20 mL

## 2013-08-25 MED ORDER — DEXTROSE-NACL 5-0.9 % IV SOLN
INTRAVENOUS | Status: DC
  Administered 2013-08-25: 23:00:00 via INTRAVENOUS

## 2013-08-25 MED ORDER — RIVAROXABAN 10 MG PO TABS
10.0000 mg | ORAL_TABLET | Freq: Every day | ORAL | Status: DC
  Administered 2013-08-26 – 2013-08-27 (×2): 10 mg via ORAL
  Filled 2013-08-25 (×3): qty 1

## 2013-08-25 MED ORDER — SODIUM CHLORIDE 0.9 % IJ SOLN
INTRAMUSCULAR | Status: DC | PRN
  Administered 2013-08-25: 30 mL via INTRAVENOUS

## 2013-08-25 MED ORDER — PROPOFOL 10 MG/ML IV BOLUS
INTRAVENOUS | Status: AC
  Filled 2013-08-25: qty 20

## 2013-08-25 MED ORDER — HYDROMORPHONE HCL PF 1 MG/ML IJ SOLN
0.5000 mg | INTRAMUSCULAR | Status: DC | PRN
  Administered 2013-08-25 – 2013-08-27 (×4): 1 mg via INTRAVENOUS
  Filled 2013-08-25 (×4): qty 1

## 2013-08-25 MED ORDER — PHENYLEPHRINE HCL 10 MG/ML IJ SOLN
INTRAMUSCULAR | Status: DC | PRN
  Administered 2013-08-25 (×3): 120 ug via INTRAVENOUS
  Administered 2013-08-25: 80 ug via INTRAVENOUS

## 2013-08-25 MED ORDER — FLEET ENEMA 7-19 GM/118ML RE ENEM: 1.0000 | ENEMA | Freq: Once | RECTAL | Status: AC | PRN

## 2013-08-25 MED ORDER — METOCLOPRAMIDE HCL 10 MG PO TABS: 5.0000 mg | ORAL_TABLET | Freq: Three times a day (TID) | ORAL | Status: DC | PRN

## 2013-08-25 MED ORDER — PHENYLEPHRINE 40 MCG/ML (10ML) SYRINGE FOR IV PUSH (FOR BLOOD PRESSURE SUPPORT)
PREFILLED_SYRINGE | INTRAVENOUS | Status: AC
  Filled 2013-08-25: qty 30

## 2013-08-25 MED ORDER — HYDROMORPHONE HCL PF 1 MG/ML IJ SOLN: 0.2500 mg | INTRAMUSCULAR | Status: DC | PRN

## 2013-08-25 MED ORDER — SODIUM CHLORIDE 0.9 % IV SOLN: INTRAVENOUS | Status: DC

## 2013-08-25 MED ORDER — PROPOFOL INFUSION 10 MG/ML OPTIME
INTRAVENOUS | Status: DC | PRN
  Administered 2013-08-25: 75 ug/kg/min via INTRAVENOUS

## 2013-08-25 MED ORDER — FENTANYL CITRATE 0.05 MG/ML IJ SOLN
INTRAMUSCULAR | Status: AC
  Filled 2013-08-25: qty 2

## 2013-08-25 MED ORDER — PREGABALIN 75 MG PO CAPS
75.0000 mg | ORAL_CAPSULE | Freq: Two times a day (BID) | ORAL | Status: DC
  Administered 2013-08-25 – 2013-08-27 (×4): 75 mg via ORAL
  Filled 2013-08-25 (×4): qty 1

## 2013-08-25 MED ORDER — PSYLLIUM 95 % PO PACK
2.0000 | PACK | Freq: Two times a day (BID) | ORAL | Status: DC
  Administered 2013-08-26 – 2013-08-27 (×3): 2 via ORAL
  Filled 2013-08-25 (×5): qty 2

## 2013-08-25 MED ORDER — MIDAZOLAM HCL 2 MG/2ML IJ SOLN
INTRAMUSCULAR | Status: AC
  Filled 2013-08-25: qty 2

## 2013-08-25 MED ORDER — IMIPRAMINE HCL 50 MG PO TABS
50.0000 mg | ORAL_TABLET | Freq: Every day | ORAL | Status: DC
  Filled 2013-08-25: qty 1

## 2013-08-25 MED ORDER — ACETAMINOPHEN 500 MG PO TABS
1000.0000 mg | ORAL_TABLET | Freq: Four times a day (QID) | ORAL | Status: AC
  Administered 2013-08-25 – 2013-08-26 (×4): 1000 mg via ORAL
  Filled 2013-08-25 (×5): qty 2

## 2013-08-25 MED ORDER — LIDOCAINE HCL (CARDIAC) 20 MG/ML IV SOLN
INTRAVENOUS | Status: AC
  Filled 2013-08-25: qty 5

## 2013-08-25 MED ORDER — HYDROMORPHONE HCL 2 MG PO TABS
2.0000 mg | ORAL_TABLET | ORAL | Status: DC | PRN
  Administered 2013-08-25: 4 mg via ORAL
  Administered 2013-08-25: 2 mg via ORAL
  Administered 2013-08-26 – 2013-08-27 (×8): 4 mg via ORAL
  Filled 2013-08-25 (×9): qty 2
  Filled 2013-08-25: qty 1

## 2013-08-25 MED ORDER — EPHEDRINE SULFATE 50 MG/ML IJ SOLN
INTRAMUSCULAR | Status: AC
  Filled 2013-08-25: qty 1

## 2013-08-25 MED ORDER — DEXAMETHASONE SODIUM PHOSPHATE 10 MG/ML IJ SOLN
INTRAMUSCULAR | Status: AC
  Filled 2013-08-25: qty 1

## 2013-08-25 MED ORDER — VANCOMYCIN HCL IN DEXTROSE 1-5 GM/200ML-% IV SOLN
1000.0000 mg | INTRAVENOUS | Status: AC
  Administered 2013-08-25: 1000 mg via INTRAVENOUS

## 2013-08-25 MED ORDER — ACETAMINOPHEN 650 MG RE SUPP: 650.0000 mg | Freq: Four times a day (QID) | RECTAL | Status: DC | PRN

## 2013-08-25 MED ORDER — SODIUM CHLORIDE 0.9 % IJ SOLN
INTRAMUSCULAR | Status: AC
  Filled 2013-08-25: qty 10

## 2013-08-25 MED ORDER — VALACYCLOVIR HCL 500 MG PO TABS
500.0000 mg | ORAL_TABLET | Freq: Every day | ORAL | Status: DC
  Administered 2013-08-26 – 2013-08-27 (×2): 500 mg via ORAL
  Filled 2013-08-25 (×2): qty 1

## 2013-08-25 MED ORDER — MIDAZOLAM HCL 5 MG/5ML IJ SOLN
INTRAMUSCULAR | Status: DC | PRN
  Administered 2013-08-25: 2 mg via INTRAVENOUS

## 2013-08-25 MED ORDER — METOCLOPRAMIDE HCL 5 MG/ML IJ SOLN: 5.0000 mg | Freq: Three times a day (TID) | INTRAMUSCULAR | Status: DC | PRN

## 2013-08-25 MED ORDER — FENTANYL CITRATE 0.05 MG/ML IJ SOLN
INTRAMUSCULAR | Status: DC | PRN
  Administered 2013-08-25 (×2): 100 ug via INTRAVENOUS

## 2013-08-25 MED ORDER — DEXAMETHASONE 6 MG PO TABS
10.0000 mg | ORAL_TABLET | Freq: Every day | ORAL | Status: AC
  Filled 2013-08-25: qty 1

## 2013-08-25 MED ORDER — ALLOPURINOL 100 MG PO TABS
100.0000 mg | ORAL_TABLET | Freq: Every morning | ORAL | Status: DC
  Administered 2013-08-26 – 2013-08-27 (×2): 100 mg via ORAL
  Filled 2013-08-25 (×2): qty 1

## 2013-08-25 MED ORDER — ACETAMINOPHEN 500 MG PO TABS
1000.0000 mg | ORAL_TABLET | Freq: Once | ORAL | Status: AC
  Administered 2013-08-25: 1000 mg via ORAL
  Filled 2013-08-25: qty 2

## 2013-08-25 MED ORDER — ONDANSETRON HCL 4 MG/2ML IJ SOLN
INTRAMUSCULAR | Status: AC
  Filled 2013-08-25: qty 2

## 2013-08-25 MED ORDER — VANCOMYCIN HCL IN DEXTROSE 1-5 GM/200ML-% IV SOLN
1000.0000 mg | Freq: Two times a day (BID) | INTRAVENOUS | Status: AC
  Administered 2013-08-26: 1000 mg via INTRAVENOUS
  Filled 2013-08-25: qty 200

## 2013-08-25 MED ORDER — VANCOMYCIN HCL IN DEXTROSE 1-5 GM/200ML-% IV SOLN
INTRAVENOUS | Status: AC
  Filled 2013-08-25: qty 200

## 2013-08-25 MED ORDER — MENTHOL 3 MG MT LOZG
1.0000 | LOZENGE | OROMUCOSAL | Status: DC | PRN
  Filled 2013-08-25: qty 9

## 2013-08-25 MED ORDER — PROMETHAZINE HCL 25 MG/ML IJ SOLN: 6.2500 mg | INTRAMUSCULAR | Status: DC | PRN

## 2013-08-25 MED ORDER — ONDANSETRON HCL 4 MG PO TABS: 4.0000 mg | ORAL_TABLET | Freq: Four times a day (QID) | ORAL | Status: DC | PRN

## 2013-08-25 MED ORDER — LACTATED RINGERS IV SOLN
INTRAVENOUS | Status: DC | PRN
  Administered 2013-08-25 (×3): via INTRAVENOUS

## 2013-08-25 MED ORDER — POLYETHYLENE GLYCOL 3350 17 G PO PACK: 17.0000 g | PACK | Freq: Every day | ORAL | Status: DC | PRN

## 2013-08-25 MED ORDER — CHLORHEXIDINE GLUCONATE 4 % EX LIQD: 60.0000 mL | Freq: Once | CUTANEOUS | Status: DC

## 2013-08-25 MED ORDER — LACTATED RINGERS IV SOLN
INTRAVENOUS | Status: DC
  Administered 2013-08-25: 1000 mL via INTRAVENOUS

## 2013-08-25 SURGICAL SUPPLY — 56 items
BAG ZIPLOCK 12X15 (MISCELLANEOUS) ×2 IMPLANT
BANDAGE ELASTIC 6 VELCRO ST LF (GAUZE/BANDAGES/DRESSINGS) ×2 IMPLANT
BANDAGE ESMARK 6X9 LF (GAUZE/BANDAGES/DRESSINGS) ×1 IMPLANT
BLADE SAG 18X100X1.27 (BLADE) ×2 IMPLANT
BLADE SAW SGTL 11.0X1.19X90.0M (BLADE) ×2 IMPLANT
BNDG ESMARK 6X9 LF (GAUZE/BANDAGES/DRESSINGS) ×2
BOWL SMART MIX CTS (DISPOSABLE) ×2 IMPLANT
CAPT RP KNEE ×2 IMPLANT
CEMENT HV SMART SET (Cement) ×2 IMPLANT
CUFF TOURN SGL QUICK 34 (TOURNIQUET CUFF) ×1
CUFF TRNQT CYL 34X4X40X1 (TOURNIQUET CUFF) ×1 IMPLANT
DECANTER SPIKE VIAL GLASS SM (MISCELLANEOUS) ×2 IMPLANT
DRAPE EXTREMITY T 121X128X90 (DRAPE) ×2 IMPLANT
DRAPE POUCH INSTRU U-SHP 10X18 (DRAPES) ×2 IMPLANT
DRAPE U-SHAPE 47X51 STRL (DRAPES) ×2 IMPLANT
DRSG ADAPTIC 3X8 NADH LF (GAUZE/BANDAGES/DRESSINGS) ×2 IMPLANT
DRSG PAD ABDOMINAL 8X10 ST (GAUZE/BANDAGES/DRESSINGS) ×2 IMPLANT
DURAPREP 26ML APPLICATOR (WOUND CARE) ×2 IMPLANT
ELECT REM PT RETURN 9FT ADLT (ELECTROSURGICAL) ×2
ELECTRODE REM PT RTRN 9FT ADLT (ELECTROSURGICAL) ×1 IMPLANT
EVACUATOR 1/8 PVC DRAIN (DRAIN) ×2 IMPLANT
FACESHIELD LNG OPTICON STERILE (SAFETY) ×10 IMPLANT
GAUZE SPONGE 4X4 16PLY XRAY LF (GAUZE/BANDAGES/DRESSINGS) ×2 IMPLANT
GLOVE BIO SURGEON STRL SZ7.5 (GLOVE) IMPLANT
GLOVE BIO SURGEON STRL SZ8 (GLOVE) ×2 IMPLANT
GLOVE BIOGEL PI IND STRL 8 (GLOVE) ×2 IMPLANT
GLOVE BIOGEL PI INDICATOR 8 (GLOVE) ×2
GLOVE SURG SS PI 6.5 STRL IVOR (GLOVE) IMPLANT
GOWN PREVENTION PLUS LG XLONG (DISPOSABLE) ×2 IMPLANT
GOWN STRL REIN XL XLG (GOWN DISPOSABLE) IMPLANT
HANDPIECE INTERPULSE COAX TIP (DISPOSABLE) ×1
IMMOBILIZER KNEE 20 (SOFTGOODS) ×2
IMMOBILIZER KNEE 20 THIGH 36 (SOFTGOODS) ×1 IMPLANT
KIT BASIN OR (CUSTOM PROCEDURE TRAY) ×2 IMPLANT
MANIFOLD NEPTUNE II (INSTRUMENTS) ×2 IMPLANT
NDL SAFETY ECLIPSE 18X1.5 (NEEDLE) ×2 IMPLANT
NEEDLE HYPO 18GX1.5 SHARP (NEEDLE) ×2
NS IRRIG 1000ML POUR BTL (IV SOLUTION) ×2 IMPLANT
PACK TOTAL JOINT (CUSTOM PROCEDURE TRAY) ×2 IMPLANT
PAD ABD 7.5X8 STRL (GAUZE/BANDAGES/DRESSINGS) ×2 IMPLANT
PADDING CAST COTTON 6X4 STRL (CAST SUPPLIES) ×2 IMPLANT
POSITIONER SURGICAL ARM (MISCELLANEOUS) ×2 IMPLANT
SET HNDPC FAN SPRY TIP SCT (DISPOSABLE) ×1 IMPLANT
SPONGE GAUZE 4X4 12PLY (GAUZE/BANDAGES/DRESSINGS) ×2 IMPLANT
STRIP CLOSURE SKIN 1/2X4 (GAUZE/BANDAGES/DRESSINGS) ×2 IMPLANT
SUCTION FRAZIER 12FR DISP (SUCTIONS) ×2 IMPLANT
SUT MNCRL AB 4-0 PS2 18 (SUTURE) ×2 IMPLANT
SUT VIC AB 2-0 CT1 27 (SUTURE) ×3
SUT VIC AB 2-0 CT1 TAPERPNT 27 (SUTURE) ×3 IMPLANT
SUT VLOC 180 0 24IN GS25 (SUTURE) ×2 IMPLANT
SYR 20CC LL (SYRINGE) ×2 IMPLANT
SYR 50ML LL SCALE MARK (SYRINGE) ×2 IMPLANT
TOWEL OR 17X26 10 PK STRL BLUE (TOWEL DISPOSABLE) ×4 IMPLANT
TRAY FOLEY CATH 14FRSI W/METER (CATHETERS) ×2 IMPLANT
WATER STERILE IRR 1500ML POUR (IV SOLUTION) ×2 IMPLANT
WRAP KNEE MAXI GEL POST OP (GAUZE/BANDAGES/DRESSINGS) ×2 IMPLANT

## 2013-08-25 NOTE — Anesthesia Preprocedure Evaluation (Addendum)
Anesthesia Evaluation  Patient identified by MRN, date of birth, ID band Patient awake    Reviewed: Allergy & Precautions, H&P , NPO status , Patient's Chart, lab work & pertinent test results  Airway Mallampati: II TM Distance: >3 FB Neck [name]: Full    Dental no notable dental hx. (+) Dental Advisory Given   Pulmonary neg pulmonary ROS,  breath sounds clear to auscultation  Pulmonary exam normal       Cardiovascular negative cardio ROS  Rhythm:Regular Rate:Normal     Neuro/Psych  Headaches, negative psych ROS   GI/Hepatic Neg liver ROS, GERD-  ,  Endo/Other  negative endocrine ROS  Renal/GU negative Renal ROS     Musculoskeletal negative musculoskeletal ROS (+)   Abdominal   Peds  Hematology negative hematology ROS (+)   Anesthesia Other Findings   Reproductive/Obstetrics negative OB ROS                          Anesthesia Physical Anesthesia Plan  ASA: II  Anesthesia Plan: Spinal   Post-op Pain Management:    Induction: Intravenous  Airway Management Planned: Simple Face Mask  Additional Equipment:   Intra-op Plan:   Post-operative Plan:   Informed Consent: I have reviewed the patients History and Physical, chart, labs and discussed the procedure including the risks, benefits and alternatives for the proposed anesthesia with the patient or authorized representative who has indicated his/her understanding and acceptance.   Dental advisory given  Plan Discussed with: CRNA  Anesthesia Plan Comments:        Anesthesia Quick Evaluation

## 2013-08-25 NOTE — Transfer of Care (Signed)
Immediate Anesthesia Transfer of Care Note  Patient: [name] [name]  Procedure(s) Performed: Procedure(s): RIGHT TOTAL KNEE ARTHROPLASTY (Right)  Patient Location: PACU  Anesthesia Type:Spinal  Level of Consciousness: awake, alert , oriented and patient cooperative  Airway & Oxygen Therapy: Patient Spontanous Breathing and Patient connected to face mask oxygen  Post-op Assessment: Report given to PACU RN and Post -op Vital signs reviewed and stable  Post vital signs: stable  Complications: No apparent anesthesia complications  5 spinal level

## 2013-08-25 NOTE — Op Note (Signed)
Pre-operative diagnosis- Osteoarthritis  Right knee(s)  Post-operative diagnosis- Osteoarthritis Right knee(s)  Procedure-  Right  Total Knee Arthroplasty  Surgeon- Gus Rankin. Orlando Thalmann, MD  Assistant- Avel Peace, PA-C   Anesthesia-  Spinal EBL-* No blood loss amount entered *  Drains Hemovac  Tourniquet time- 48 minutes @ 300 mm Hg  Complications- None  Condition-PACU - hemodynamically stable.   Brief Clinical Note  [name] [name] is a 62 y.o. year old male with end stage OA of his right knee with progressively worsening pain and dysfunction. He has constant pain, with activity and at rest and significant functional deficits with difficulties even with ADLs. He has had extensive non-op management including analgesics, injections of cortisone and viscosupplements, and home exercise program, but remains in significant pain with significant dysfunction. Radiographs show severe bone on bone changes medial and patellofemoral with large varus deformity. He presents now for right Total Knee Arthroplasty.    Procedure in detail---   The patient is brought into the operating room and positioned supine on the operating table. After successful administration of  Spinal,   a tourniquet is placed high on the  Right thigh(s) and the lower extremity is prepped and draped in the usual sterile fashion. Time out is performed by the operating team and then the  Right lower extremity is wrapped in Esmarch, knee flexed and the tourniquet inflated to 300 mmHg.       A midline incision is made with a ten blade through the subcutaneous tissue to the level of the extensor mechanism. A fresh blade is used to make a medial parapatellar arthrotomy. Soft tissue over the proximal medial tibia is subperiosteally elevated to the joint line with a knife and into the semimembranosus bursa with a Cobb elevator. Soft tissue over the proximal lateral tibia is elevated with attention being paid to avoiding the patellar tendon on  the tibial tubercle. The patella is everted, knee flexed 90 degrees and the ACL and PCL are removed. Findings are bone on bone medial and patellofemoral with large global osteophytes.        The drill is used to create a starting hole in the distal femur and the canal is thoroughly irrigated with sterile saline to remove the fatty contents. The 5 degree Right  valgus alignment guide is placed into the femoral canal and the distal femoral cutting block is pinned to remove 10 mm off the distal femur. Resection is made with an oscillating saw.      The tibia is subluxed forward and the menisci are removed. The extramedullary alignment guide is placed referencing proximally at the medial aspect of the tibial tubercle and distally along the second metatarsal axis and tibial crest. The block is pinned to remove 2mm off the more deficient medial  side. Resection is made with an oscillating saw. Size 4is the most appropriate size for the tibia and the proximal tibia is prepared with the modular drill and keel punch for that size.      The femoral sizing guide is placed and size 5 is most appropriate. Rotation is marked off the epicondylar axis and confirmed by creating a rectangular flexion gap at 90 degrees. The size 5 cutting block is pinned in this rotation and the anterior, posterior and chamfer cuts are made with the oscillating saw. The intercondylar block is then placed and that cut is made.      Trial size 4 tibial component, trial size 5 posterior stabilized femur and a 12.5  mm posterior stabilized rotating platform insert trial is placed. Full extension is achieved with excellent varus/valgus and anterior/posterior balance throughout full range of motion. The patella is everted and thickness measured to be 25  mm. Free hand resection is taken to 15 mm, a 38 template is placed, lug holes are drilled, trial patella is placed, and it tracks normally. Osteophytes are removed off the posterior femur with the trial  in place. All trials are removed and the cut bone surfaces prepared with pulsatile lavage. Cement is mixed and once ready for implantation, the size 4 tibial implant, size  5 posterior stabilized femoral component, and the size 38 patella are cemented in place and the patella is held with the clamp. The trial insert is placed and the knee held in full extension. The Exparel (20 ml mixed with 30 ml saline) and .25% Bupivicaine, are injected into the extensor mechanism, posterior capsule, medial and lateral gutters and subcutaneous tissues.  All extruded cement is removed and once the cement is hard the permanent 12.5 mm posterior stabilized rotating platform insert is placed into the tibial tray.      The wound is copiously irrigated with saline solution and the extensor mechanism closed over a hemovac drain with #1 PDS suture. The tourniquet is released for a total tourniquet time of 48  minutes. Flexion against gravity is 135 degrees and the patella tracks normally. Subcutaneous tissue is closed with 2.0 vicryl and subcuticular with running 4.0 Monocryl. The incision is cleaned and dried and steri-strips and a bulky sterile dressing are applied. The limb is placed into a knee immobilizer and the patient is awakened and transported to recovery in stable condition.      Please note that a surgical assistant was a medical necessity for this procedure in order to perform it in a safe and expeditious manner. Surgical assistant was necessary to retract the ligaments and vital neurovascular structures to prevent injury to them and also necessary for proper positioning of the limb to allow for anatomic placement of the prosthesis.   Gus Rankin Minami Arriaga, MD    08/25/2013, 4:28 PM

## 2013-08-25 NOTE — Interval H&P Note (Signed)
History and Physical Interval Note:  08/25/2013 1:04 PM  [name] [name]  has presented today for surgery, with the diagnosis of OA OF RIGHT KNEE  The various methods of treatment have been discussed with the patient and family. After consideration of risks, benefits and other options for treatment, the patient has consented to  Procedure(s): RIGHT TOTAL KNEE ARTHROPLASTY (Right) as a surgical intervention .  The patient's history has been reviewed, patient examined, no change in status, stable for surgery.  I have reviewed the patient's chart and labs.  Questions were answered to the patient's satisfaction.     Loanne Drilling

## 2013-08-25 NOTE — Plan of Care (Signed)
Problem: Consults Goal: Diagnosis- Total Joint Replacement Primary Total Knee     

## 2013-08-25 NOTE — H&P (View-Only) (Signed)
[name] [name]. [name] [name]  DOB: 1951/03/24 Married / Language: English / Race: White Male  Date of Admission:  08/25/2013  Chief Complaint:  Left Knee Pain  History of Present Illness The patient is a 62 year old male who comes in for a preoperative History and Physical. The patient is scheduled for a right total knee arthroplasty to be performed by Dr. Gus Rankin. Aluisio, MD at [facility] on 08/25/2013. The patient is a 62 year old male who presents for follow up of their knee. The patient is being followed for their right knee pain and osteoarthritis. Symptoms reported today include: pain, swelling, stiffness and difficulty ambulating. The patient feels that they are doing poorly and report their pain level to be moderate to severe. Current treatment includes: home exercise program. The following medication has been used for pain control: none. The patient indicates that they have questions or concerns today regarding surgery. He is scheduled for total knee on December 1st.. [name] [name] states that his knee is getting progressively worse. It is now bothering him at all times. It is definitely limiting what he can and can not do. We had discussed surgery a while back and he was not quite ready. He is at a stage now however where he feels its his only option. He is ready to proceed. They have been treated conservatively in the past for the above stated problem and despite conservative measures, they continue to have progressive pain and severe functional limitations and dysfunction. They have failed non-operative management including home exercise, medications, and injections. It is felt that they would benefit from undergoing total joint replacement. Risks and benefits of the procedure have been discussed with the patient and they elect to proceed with surgery. There are no active contraindications to surgery such as ongoing infection or rapidly progressive neurological disease.   Problem  List Primary osteoarthritis of one knee (715.16)    Allergies Shellfish. shrimp only Penicillin G Sodium *PENICILLINS* Erythromycin *MACROLIDES* Amoxicillin *PENICILLINS* Vioxx *ANALGESICS - ANTI-INFLAMMATORY* Codeine Sulfate *ANALGESICS - OPIOID* Benadryl *ANTIHISTAMINES*    Family History Other medical problems. Father has bad Emphysema Cancer. First Degree Relatives. mother Cerebrovascular Accident. mother    Social History Tobacco use. Never smoker. never smoker Living situation. live with spouse Tobacco / smoke exposure. no Pain Contract. no Previously in rehab. no Most recent primary occupation. [employer] Number of flights of stairs before winded. greater than 5 Current work status. working full time Marital status. married Exercise. Exercises daily; does other Illicit drug use. no Alcohol use. current drinker; only occasionally per week Children. 1 Drug/Alcohol Rehab (Currently). no Advance Directives. Living Will Post-Surgical Plans. Plan is for home.    Past Surgical History Rotator Cuff Repair. right Appendectomy Arthroscopy of Knee. right Arthroscopy of Shoulder. right    Medical History Chronic Pain Kidney Stone Skin Cancer Gout Hypercholesterolemia Gastroesophageal Reflux Disease Migraine Headache Osteoarthritis Acquired spondylolisthesis (738.4)   Review of Systems General:Not Present- Chills, Fever, Night Sweats, Fatigue, Weight Gain, Weight Loss and Memory Loss. Skin:Not Present- Hives, Itching, Rash, Eczema and Lesions. HEENT:Not Present- Tinnitus, Headache, Double Vision, Visual Loss, Hearing Loss and Dentures. Respiratory:Not Present- Shortness of breath with exertion, Shortness of breath at rest, Allergies, Coughing up blood and Chronic Cough. Cardiovascular:Not Present- Chest Pain, Racing/skipping heartbeats, Difficulty Breathing Lying Down, Murmur, Swelling and  Palpitations. Gastrointestinal:Not Present- Bloody Stool, Heartburn, Abdominal Pain, Vomiting, Nausea, Constipation, Diarrhea, Difficulty Swallowing, Jaundice and Loss of appetitie. Male Genitourinary:Not Present- Urinary frequency, Blood in Urine, Weak urinary stream,  Discharge, Flank Pain, Incontinence, Painful Urination, Urgency, Urinary Retention and Urinating at Night. Musculoskeletal:Present- Muscle Weakness, Joint Swelling, Joint Pain, Back Pain and Spasms. Not Present- Muscle Pain and Morning Stiffness. Neurological:Not Present- Tremor, Dizziness, Blackout spells, Paralysis, Difficulty with balance and Weakness. Psychiatric:Not Present- Insomnia.    Vitals Weight: 175 lb Height: 69 in Weight was reported by patient. Body Surface Area: 1.97 m Body Mass Index: 25.84 kg/m Pulse: 60 (Regular) Resp.: 12 (Unlabored) BP: 128/82 (Sitting, Right Arm, Standard)     Physical Exam The physical exam findings are as follows:   General Mental Status - Alert, cooperative and good historian. General Appearance- pleasant. Not in acute distress. Orientation- Oriented X3. Build & Nutrition- Well nourished and Well developed.   Head and Neck Head- normocephalic, atraumatic . Neck Global Assessment- supple. no bruit auscultated on the right and no bruit auscultated on the left.   Eye Pupil- Bilateral- Regular and Round. Motion- Bilateral- EOMI.   Chest and Lung Exam Auscultation: Breath sounds:- clear at anterior chest wall and - clear at posterior chest wall. Adventitious sounds:- No Adventitious sounds.   Cardiovascular Auscultation:Rhythm- Regular rate and rhythm. Heart Sounds- S1 WNL and S2 WNL. Murmurs & Other Heart Sounds:Auscultation of the heart reveals - No Murmurs.   Abdomen Palpation/Percussion:Tenderness- Abdomen is non-tender to palpation. Rigidity (guarding)- Abdomen is soft. Auscultation:Auscultation of the abdomen  reveals - Bowel sounds normal.   Male Genitourinary Not done, not pertinent to present illness  Musculoskeletal Well developed male alert and oriented in no apparent distress. Evaluation of his right knee shows no effusion. His range is about 5 to 125 degrees. He has a significant varus deformity. He is tender medially. There is no lateral tenderness or instability noted. Pulses, sensation and motor are intact. Left knee and hip exam unremarkable.  RADIOGRAPHS: Radiographs, AP both knees and lateral of the right, showing severe bone on bone arthritis in the medial and patellofemoral compartments with varus deformity.   Assessment & Plan Primary osteoarthritis of one knee (715.16) Impression: Right Knee  Note: Plan is for a Right Total Knee Replacement by Dr. Lequita Halt.  Plan is to go home.  PCP - Dr. Amador Cunas - Patient has been seen preoperatively and felt to be stable for surgery.  The patient does not have any contraindications and will receive TXA (tranexamic acid) prior to surgery.  Signed electronically by Lauraine Rinne, III PA-C

## 2013-08-25 NOTE — Anesthesia Procedure Notes (Addendum)
Spinal  Patient location during procedure: OR Start time: 08/25/2013 3:07 PM End time: 08/25/2013 3:12 PM Staffing Anesthesiologist: Lewie Loron R Performed by: anesthesiologist  Preanesthetic Checklist Completed: patient identified, site marked, surgical consent, pre-op evaluation, timeout performed, IV checked, risks and benefits discussed and monitors and equipment checked Spinal Block Patient position: sitting Prep: ChloraPrep Patient monitoring: heart rate, continuous pulse ox and blood pressure Approach: midline Location: L2-3 Injection technique: single-shot Needle Needle type: Sprotte  Needle gauge: 24 G Needle length: 9 cm Additional Notes Expiration date of kit checked and confirmed. Patient tolerated procedure well, without complications.

## 2013-08-26 ENCOUNTER — Encounter (HOSPITAL_COMMUNITY): Payer: Self-pay | Admitting: Orthopedic Surgery

## 2013-08-26 LAB — CBC
HCT: 36.9 % — ABNORMAL LOW (ref 39.0–52.0)
Hemoglobin: 13 g/dL (ref 13.0–17.0)
MCH: 33.6 pg (ref 26.0–34.0)
Platelets: 211 10*3/uL (ref 150–400)
RBC: 3.87 MIL/uL — ABNORMAL LOW (ref 4.22–5.81)
RDW: 12.8 % (ref 11.5–15.5)
WBC: 11.1 10*3/uL — ABNORMAL HIGH (ref 4.0–10.5)

## 2013-08-26 LAB — BASIC METABOLIC PANEL
BUN: 16 mg/dL (ref 6–23)
CO2: 28 mEq/L (ref 19–32)
Calcium: 8.2 mg/dL — ABNORMAL LOW (ref 8.4–10.5)
Chloride: 101 mEq/L (ref 96–112)
GFR calc Af Amer: >90 mL/min (ref >90)
Potassium: 4.1 mEq/L (ref 3.5–5.1)

## 2013-08-26 MED ORDER — SODIUM CHLORIDE 0.9 % IV BOLUS (SEPSIS)
250.0000 mL | Freq: Once | INTRAVENOUS | Status: AC
  Administered 2013-08-26: 250 mL via INTRAVENOUS

## 2013-08-26 NOTE — Care Management Note (Signed)
    Page 1 of 2   08/27/2013     8:12:44 PM   CARE MANAGEMENT NOTE 08/27/2013  Patient:  [name] [name], [name] [name]   Account Number:  [id number]  Date Initiated:  08/26/2013  Documentation initiated by:  Colleen Can  Subjective/Objective Assessment:   DX OA RT KNEE; TOTAL KNEE REPLACEMENT    Pre-aranged with [name] to provide [facility] services.     Action/Plan:   Cm spoke with patient & spouse. Plans are for patient to return to his home in [place] where spouse will be caregiver. He will need HH services & DME.   Anticipated DC Date:  08/27/2013   Anticipated DC Plan:  HOME W HOME HEALTH SERVICES      DC Planning Services  CM consult      PAC Choice  DURABLE MEDICAL EQUIPMENT  HOME HEALTH   Choice offered to / List presented to:  C-1 Patient   DME arranged  3-N-1  WALKER - ROLLING      DME agency  [facility]     HH arranged  HH-2 PT      [facility] agency  [facility]   Status of service:  Completed, signed off Medicare Important Message given?   (If response is "NO", the following Medicare IM given date fields will be blank) Date Medicare IM given:   Date Additional Medicare IM given:    Discharge Disposition:  HOME W HOME HEALTH SERVICES  Per UR Regulation:    If discussed at Long Length of Stay Meetings, dates discussed:    Comments:  08/27/2013 Colleen Can BSN RN CCM [phone] Call made to [phone] regarding expectation of delivery of DME this AM. Advised that patient is discharging today. Advised that this information was given to intake 0n 12/02 and that customer services  of [facility] stated they would put a rush on the order for DME. They were unable to give ETA of DME.  1330 Colleen Can BSN RN CCM DME has not been delivered. CM called [facility] and spoke with supervisor -Drenda Freeze who had been advised of case. She advised that they have the order and that delivery will be made within several hours. Advised supervisor that patient needed  explanation and she agreed to speak with patient/customer.  08/26/2013 Colleen Can BSN RN CCM [phone] Network provider for DME is [facility]. CM spoke with Ashok Cordia at [place] who states they can provide 3n1 and RW for patient. States they are in [facility] network. Request for DME faxed to [phone] with confirmation. DME will be delivered to pt's hospital room. 's insurance is [employer]

## 2013-08-26 NOTE — Progress Notes (Signed)
Physical Therapy Treatment Note   08/26/13 1500  PT Visit Information  Last PT Received On 08/26/13  Assistance Needed +1  History of Present Illness s./p R TKA  PT Time Calculation  PT Start Time 1350  PT Stop Time 1422  PT Time Calculation (min) 32 min  Subjective Data  Subjective Pt ambulated in hallway and performed a few exercises in supine.  Pt plans to d/c home tomorrow.  Precautions  Precautions Knee  Precaution Comments Hx of OA bil shoulders - limited motion  Required Braces or Orthoses Knee Immobilizer - Right  Knee Immobilizer - Right Discontinue once straight leg raise with < 10 degree lag  Restrictions  Other Position/Activity Restrictions WBAT  Cognition  Arousal/Alertness Awake/alert  Behavior During Therapy WFL for tasks assessed/performed  Overall Cognitive Status Within Functional Limits for tasks assessed  Bed Mobility  Bed Mobility Sit to Supine  Sit to Supine 5: Supervision;HOB flat  Transfers  Transfers Sit to Stand;Stand to Sit  Sit to Stand 4: Min guard;From toilet;With upper extremity assist (observed with NT assisting pt)  Stand to Sit 4: Min guard;With upper extremity assist;To bed  Details for Transfer Assistance verbal cues for R LE forward  Ambulation/Gait  Ambulation/Gait Assistance 4: Min guard  Ambulation Distance (Feet) 220 Feet  Assistive device Rolling walker  Ambulation/Gait Assistance Details verbal cues for RW distance and heel strike, encouraged not overdoing distance however pt wished to continue  Gait Pattern Step-to pattern;Decreased stance time - right;Antalgic  Gait velocity decreased however improved since this morning session  Exercises  Exercises Total Joint  Total Joint Exercises  Ankle Circles/Pumps AROM;Both;20 reps;Supine  Quad Sets AROM;Right;20 reps;Supine  Short Arc Quad AROM;AAROM;Right;20 reps;Supine  Heel Slides AAROM;Right;10 reps  Goniometric [name] approx R knee AAROM -8-75* supine  PT - End of Session   Equipment Utilized During Treatment Right knee immobilizer  Activity Tolerance Patient tolerated treatment well  Patient left in bed;with call bell/phone within reach;with family/visitor present  PT - Assessment/Plan  PT Plan Current plan remains appropriate  PT Frequency 7X/week  Follow Up Recommendations Home health PT  PT equipment Rolling walker with 5" wheels  PT Goal Progression  Progress towards PT goals Progressing toward goals  PT General Charges  $$ ACUTE PT VISIT 1 Procedure  PT Treatments  $Gait Training 8-22 mins  $Therapeutic Exercise 8-22 mins  Pt premedicated.  Pt states pain better during ambulation this visit.  Zenovia Jarred, PT, DPT 08/26/2013 Pager: [phone]

## 2013-08-26 NOTE — Evaluation (Signed)
Physical Therapy Evaluation Patient Details Name: [name] [name] MRN: [id number] DOB: 04-18-1951 Today's Date: 08/26/2013 Time: 4098-1191 PT Time Calculation (min): 34 min  PT Assessment / Plan / Recommendation History of Present Illness     Clinical Impression  Pt is s/p R TKA resulting in the deficits listed below (see PT Problem List).  Pt will benefit from skilled PT to increase their independence and safety with mobility to allow discharge to home with spouse.  Pt reports spouse concerned about caring for pt at home however should be available for afternoon session.     PT Assessment  Patient needs continued PT services    Follow Up Recommendations  Home health PT    Does the patient have the potential to tolerate intense rehabilitation      Barriers to Discharge        Equipment Recommendations  Rolling walker with 5" wheels    Recommendations for Other Services OT consult   Frequency 7X/week    Precautions / Restrictions Precautions Precautions: Knee Precaution Comments: Hx of OA bil shoulders - limited motion Required Braces or Orthoses: Knee Immobilizer - Right Knee Immobilizer - Right: Discontinue once straight leg raise with < 10 degree lag Restrictions Other Position/Activity Restrictions: WBAT   Pertinent Vitals/Pain 5/10 R knee pain, RN bringing pain meds after session, repositioned to comfort      Mobility  Bed Mobility Bed Mobility: Supine to Sit Supine to Sit: 4: Min assist;HOB elevated Details for Bed Mobility Assistance: verbal cues for technique, assist for R LE, waited for dizziness to resolve prior to continuing mobilizing Transfers Transfers: Sit to Stand;Stand to Sit Sit to Stand: 4: Min assist;With upper extremity assist;From bed Stand to Sit: 4: Min guard;With upper extremity assist;To chair/3-in-1 Details for Transfer Assistance: verbal cues for safe technique including hand placement and R LE forward, increased time discussing transfer  technique prior to mobilizing Ambulation/Gait Ambulation/Gait Assistance: 4: Min assist Ambulation Distance (Feet): 80 Feet Assistive device: Rolling walker Ambulation/Gait Assistance Details: verbal cues for safe technique, sequence, RW distance, posture, heel strike Gait Pattern: Step-to pattern;Decreased stance time - right;Antalgic Gait velocity: very slow General Gait Details: pt initially did not WB however encouraged some WBing with use of RW to assist with pain, pt reports 5/10 pain which remained the same during session    Exercises     PT Diagnosis: Acute pain;Difficulty walking  PT Problem List: Decreased strength;Decreased range of motion;Decreased mobility;Pain;Decreased knowledge of use of DME PT Treatment Interventions: Gait training;DME instruction;Stair training;Functional mobility training;Therapeutic activities;Patient/family education;Therapeutic exercise     PT Goals(Current goals can be found in the care plan section) Acute Rehab PT Goals Patient Stated Goal: return to PLOF PT Goal Formulation: With patient Time For Goal Achievement: 08/30/13 Potential to Achieve Goals: Good  Visit Information  Last PT Received On: 08/26/13 Assistance Needed: +1       Prior Functioning  Home Living Family/patient expects to be discharged to:: Private residence Living Arrangements: Spouse/significant other Available Help at Discharge: Family Type of Home: House Home Access: Stairs to enter [employer] of Steps: 2-5 (2 in garage and 4-5 in front and front has one rail) Entrance Stairs-Rails: None Home Layout: Two level;Able to live on main level with bedroom/bathroom Home Equipment: None Prior Function Level of Independence: Independent Communication Communication: No difficulties    Cognition  Cognition Arousal/Alertness: Awake/alert Behavior During Therapy: WFL for tasks assessed/performed Overall Cognitive Status: Within Functional Limits for tasks  assessed    Extremity/Trunk Assessment  Upper Extremity Assessment Upper Extremity Assessment: Defer to OT evaluation (limited AROM and poor strength per pt due to bil OA shoulders) Lower Extremity Assessment Lower Extremity Assessment: RLE deficits/detail RLE Deficits / Details: fair quad strength, [name] TBA, maintained KI   Balance    End of Session PT - End of Session Equipment Utilized During Treatment: Gait belt Activity Tolerance: Patient tolerated treatment well Patient left: in chair;with call bell/phone within reach Nurse Communication: Patient requests pain meds;Mobility status  GP     Mirtie Bastyr,KATHrine E 08/26/2013, 12:17 PM Zenovia Jarred, PT, DPT 08/26/2013 Pager: [phone]

## 2013-08-26 NOTE — Progress Notes (Signed)
   Subjective: 1 Day Post-Op Procedure(s) (LRB): RIGHT TOTAL KNEE ARTHROPLASTY (Right) Patient reports pain as mild.   Patient seen in rounds with Dr. Lequita Halt.  Blood pressure was soft last night and early this morning.  Will give fluids and recheck pressure. Patient is well, but has had some minor complaints of pain in the knee, requiring pain medications We will start therapy today.  Plan is to go Home after hospital stay.  Objective: Vital signs in last 24 hours: Temp:  [97.3 F (36.3 C)-98.1 F (36.7 C)] 97.6 F (36.4 C) (12/02 0524) Pulse Rate:  [50-72] 52 (12/02 0524) Resp:  [10-19] 16 (12/02 0524) BP: (93-127)/(53-87) 97/53 mmHg (12/02 0524) SpO2:  [96 %-100 %] 99 % (12/02 0524) Weight:  [83.122 kg (183 lb 4 oz)] 83.122 kg (183 lb 4 oz) (12/01 1730)  Intake/Output from previous day:  Intake/Output Summary (Last 24 hours) at 08/26/13 0900 Last data filed at 08/26/13 0841  Gross per 24 hour  Intake   3830 ml  Output   3425 ml  Net    405 ml    Intake/Output this shift: Total I/O In: 240 [P.O.:240] Out: -   Labs:  Recent Labs  08/26/13 0540  HGB 13.0    Recent Labs  08/26/13 0540  WBC 11.1*  RBC 3.87*  HCT 36.9*  PLT 211    Recent Labs  08/26/13 0540  NA 135  K 4.1  CL 101  CO2 28  BUN 16  CREATININE 0.84  GLUCOSE 167*  CALCIUM 8.2*   No results found for this basename: LABPT, INR,  in the last 72 hours  EXAM General - Patient is Alert, Appropriate and Oriented Extremity - Neurovascular intact Sensation intact distally Dressing - dressing C/D/I Motor Function - intact, moving foot and toes well on exam.  Hemovac pulled without difficulty.  Past Medical History  Diagnosis Date  . ALLERGIC RHINITIS 04/22/2007  . HYPERCHOLESTEROLEMIA 04/22/2007  . HYPERLIPIDEMIA 07/29/2007  . NEPHROLITHIASIS, HX OF 07/29/2007  . Osteoarth NOS-Unspec 04/22/2007  . Complication of anesthesia   . Headache(784.0)     migraines  . DDD (degenerative disc  disease), cervical   . Shoulder pain, acute     bilateral  . History of kidney stones   . History of skin cancer   . GERD (gastroesophageal reflux disease)     infrequently    Assessment/Plan: 1 Day Post-Op Procedure(s) (LRB): RIGHT TOTAL KNEE ARTHROPLASTY (Right) Principal Problem:   OA (osteoarthritis) of knee  Estimated body mass index is 27.05 kg/(m^2) as calculated from the following:   Height as of this encounter: 5\' 9"  (1.753 m).   Weight as of this encounter: 83.122 kg (183 lb 4 oz). Advance diet Up with therapy Plan for discharge tomorrow Discharge home with home health Fluid bolus and then recheck blood pressure. Keep foley until later today for monitoring I&Os  DVT Prophylaxis - Xarelto Weight-Bearing as tolerated to right leg D/C O2 and Pulse OX and try on Room Air  [name] Orsborn 08/26/2013, 9:00 AM

## 2013-08-26 NOTE — Evaluation (Signed)
Occupational Therapy Evaluation Patient Details Name: [name] [name] MRN: [id number] DOB: 1950/10/11 Today's Date: 08/26/2013 Time: 4098-1191 OT Time Calculation (min): 31 min  OT Assessment / Plan / Recommendation History of present illness s./p R TKA   Clinical Impression   Pt tolerated pivot back to bed with min assist. Limited by pain this session, requesting to return to bed. Will need to practice 3in1 transfer and shower transfer prior to d/c. Pt states wife will be here in am for family ed.    OT Assessment  Patient needs continued OT Services    Follow Up Recommendations  No OT follow up;Supervision/Assistance - 24 hour    Barriers to Discharge      Equipment Recommendations  3 in 1 bedside comode    Recommendations for Other Services    Frequency  Min 2X/week    Precautions / Restrictions Precautions Precautions: Knee Precaution Comments: Hx of OA bil shoulders - limited motion Required Braces or Orthoses: Knee Immobilizer - Right Knee Immobilizer - Right: Discontinue once straight leg raise with < 10 degree lag Restrictions Weight Bearing Restrictions: No Other Position/Activity Restrictions: WBAT   Pertinent Vitals/Pain 7/10 with activity; nursing made aware; ice    ADL  Eating/Feeding: Simulated;Independent Where Assessed - Eating/Feeding: Chair Grooming: Simulated;Wash/dry hands;Set up Where Assessed - Grooming: Supported sitting Upper Body Bathing: Simulated;Chest;Right arm;Left arm;Abdomen;Set up Where Assessed - Upper Body Bathing: Unsupported sitting Lower Body Bathing: Simulated;Moderate assistance Where Assessed - Lower Body Bathing: Supported sit to stand Upper Body Dressing: Simulated;Set up Where Assessed - Upper Body Dressing: Unsupported sitting Lower Body Dressing: Simulated;Moderate assistance Where Assessed - Lower Body Dressing: Supported sit to stand Toilet Transfer: Mining engineer Method: Stand  pivot Toileting - Clothing Manipulation and Hygiene: Simulated;Minimal assistance Where Assessed - Engineer, mining and Hygiene: Sit to stand from 3-in-1 or toilet Equipment Used: Rolling walker ADL Comments: Pt states he is uncomfortable in chair and would like to return to bed to rest. Assisted with stand pivot back to bed. Pt states UEs are fatigued with WB this am and history of OA and weakness. Discussed 3in1 for toilet and use as shower chair. Will need to practice transfer into bathroom to 3in1.     OT Diagnosis: Generalized weakness  OT Problem List: Decreased strength;Decreased knowledge of use of DME or AE OT Treatment Interventions: Self-care/ADL training;DME and/or AE instruction;Therapeutic activities;Patient/family education   OT Goals(Current goals can be found in the care plan section) Acute Rehab OT Goals Patient Stated Goal: return to PLOF OT Goal Formulation: With patient Time For Goal Achievement: 09/02/13 Potential to Achieve Goals: Good  Visit Information  Last OT Received On: 08/26/13 Assistance Needed: +1 History of Present Illness: s./p R TKA       Prior Functioning     Home Living Family/patient expects to be discharged to:: Private residence Living Arrangements: Spouse/significant other Available Help at Discharge: Family Type of Home: House Home Access: Stairs to enter [employer] of Steps: 2-5 (2 in garage and 4-5 in front and front has one rail) Entrance Stairs-Rails: None Home Layout: Two level;Able to live on main level with bedroom/bathroom Home Equipment: None Prior Function Level of Independence: Independent Communication Communication: No difficulties         Vision/Perception     Cognition  Cognition Arousal/Alertness: Awake/alert Behavior During Therapy: WFL for tasks assessed/performed Overall Cognitive Status: Within Functional Limits for tasks assessed    Extremity/Trunk Assessment Upper  Extremity Assessment Upper Extremity Assessment: RUE deficits/detail;LUE  deficits/detail RUE Deficits / Details: pt states has OA bilateral shoulder. states shoulders are weak and fatigue quickly with WB. grossly at least 3+/5 LUE Deficits / Details: as above     Mobility Bed Mobility Bed Mobility: Sit to Supine Supine to Sit: 4: Min assist;HOB elevated Sit to Supine: 4: Min assist Details for Bed Mobility Assistance: assist for R LE onto bed. Transfers Transfers: Sit to Stand;Stand to Sit Sit to Stand: 4: Min assist;With upper extremity assist;From chair/3-in-1 Stand to Sit: 4: Min assist;With upper extremity assist;To bed Details for Transfer Assistance: verbal cues for hand placement and technique including extend R LE out in front.     Exercise     Balance Balance Balance Assessed: Yes Dynamic Standing Balance Dynamic Standing - Level of Assistance: 4: Min assist   End of Session OT - End of Session Equipment Utilized During Treatment: Rolling walker;Right knee immobilizer Activity Tolerance: Patient limited by pain Patient left: in bed;with call bell/phone within reach  GO     [name] [phone] 08/26/2013, 12:49 PM

## 2013-08-26 NOTE — Progress Notes (Signed)
Utilization review completed.  

## 2013-08-27 LAB — BASIC METABOLIC PANEL
BUN: 18 mg/dL (ref 6–23)
CO2: 28 mEq/L (ref 19–32)
Chloride: 102 mEq/L (ref 96–112)
GFR calc Af Amer: >90 mL/min (ref >90)
Glucose, Bld: 123 mg/dL — ABNORMAL HIGH (ref 70–99)
Potassium: 4.1 mEq/L (ref 3.5–5.1)
Sodium: 136 mEq/L (ref 135–145)

## 2013-08-27 LAB — CBC
HCT: 32.2 % — ABNORMAL LOW (ref 39.0–52.0)
Hemoglobin: 11.5 g/dL — ABNORMAL LOW (ref 13.0–17.0)
MCV: 96.4 fL (ref 78.0–100.0)
RDW: 13.2 % (ref 11.5–15.5)
WBC: 12.7 10*3/uL — ABNORMAL HIGH (ref 4.0–10.5)

## 2013-08-27 MED ORDER — TRAMADOL HCL 50 MG PO TABS: 50.0000 mg | ORAL_TABLET | Freq: Four times a day (QID) | ORAL | Status: DC | PRN

## 2013-08-27 MED ORDER — HYDROMORPHONE HCL 2 MG PO TABS: 2.0000 mg | ORAL_TABLET | ORAL | Status: DC | PRN

## 2013-08-27 MED ORDER — CYCLOBENZAPRINE HCL 10 MG PO TABS: 10.0000 mg | ORAL_TABLET | Freq: Three times a day (TID) | ORAL | Status: DC | PRN

## 2013-08-27 MED ORDER — RIVAROXABAN 10 MG PO TABS: 10.0000 mg | ORAL_TABLET | Freq: Every day | ORAL | Status: DC

## 2013-08-27 NOTE — Progress Notes (Signed)
Occupational Therapy Treatment Patient Details Name: [name] [name] MRN: [id number] DOB: 01-28-1951 Today's Date: 08/27/2013 Time: 1191-4782 OT Time Calculation (min): 41 min  OT Assessment / Plan / Recommendation  History of present illness s./p R TKA   OT comments  Pt did well with all ADL tasks and wife assisted hands on with LB dressing and donning KI. All goals addressed and met and all education completed. Pt supposed to d/c later today.   Follow Up Recommendations  No OT follow up;Supervision/Assistance - 24 hour    Barriers to Discharge       Equipment Recommendations  3 in 1 bedside comode    Recommendations for Other Services    Frequency     Progress towards OT Goals Progress towards OT goals: Goals met/education completed, patient discharged from OT  Plan All goals met and education completed, patient discharged from OT services    Precautions / Restrictions Precautions Precautions: Knee Precaution Comments: Hx of OA bil shoulders - limited motion Required Braces or Orthoses: Knee Immobilizer - Right Knee Immobilizer - Right: Discontinue once straight leg raise with < 10 degree lag Restrictions Weight Bearing Restrictions: No Other Position/Activity Restrictions: WBAT   Pertinent Vitals/Pain 710; R knee; reposition, ice    ADL  Toilet Transfer: Performed;Min guard Toilet Transfer Equipment: Raised toilet seat with arms (or 3-in-1 over toilet) Toileting - Clothing Manipulation and Hygiene: Simulated;Min guard Where Assessed - Toileting Clothing Manipulation and Hygiene: Sit to stand from 3-in-1 or toilet Tub/Shower Transfer: Performed;Min guard (step over ledge) Equipment Used: Rolling walker ADL Comments: Wife present for education. Discussed in length how for wife to assist with LB dressing and donning/doffing KI and when to wear KI. Wife helped with LB dressing from supine and donning KI. Pt then transferred to EOB and stood with RW. Transferred into  bathroom with RW to 3in1. Reviewed safety techniques with pt and wife. Pt also practiced shower transfer and wife assisted with steadying RW. Pt with many questions regarding ADL and OT answered each question. Pt and wife verbalized understanding of all information. Pt states he will likely sponge bathe initially until able to SLR but discussed KI use if wanting to shower before able to SLR.     OT Diagnosis:    OT Problem List:   OT Treatment Interventions:     OT Goals(current goals can now be found in the care plan section)    Visit Information  Last OT Received On: 08/27/13 Assistance Needed: +1 History of Present Illness: s./p R TKA    Subjective Data      Prior Functioning       Cognition  Cognition Arousal/Alertness: Awake/alert Behavior During Therapy: WFL for tasks assessed/performed Overall Cognitive Status: Within Functional Limits for tasks assessed    Mobility  Bed Mobility Supine to Sit: 4: Min guard;HOB elevated Transfers Transfers: Sit to Stand;Stand to Sit Sit to Stand: 4: Min guard;With upper extremity assist;From bed;From chair/3-in-1 Stand to Sit: 4: Min guard;With upper extremity assist;To chair/3-in-1 Details for Transfer Assistance: verbal cues for hand placement and R LE management.    Exercises      Balance Balance Balance Assessed: Yes Dynamic Standing Balance Dynamic Standing - Level of Assistance: 5: Stand by assistance   End of Session OT - End of Session Equipment Utilized During Treatment: Rolling walker;Right knee immobilizer Activity Tolerance: Patient limited by pain Patient left: in chair;with call bell/phone within reach CPM Right Knee CPM Right Knee: Off  GO  [name] [phone] 08/27/2013, 10:01 AM

## 2013-08-27 NOTE — Progress Notes (Signed)
Physical Therapy Treatment Patient Details Name: [name] [name] MRN: [id number] DOB: 18-Jul-1951 Today's Date: 08/27/2013 Time: 4098-1191 PT Time Calculation (min): 54 min  PT Assessment / Plan / Recommendation  History of Present Illness s./p R TKA   PT Comments   Pt progressing well; Thorough training session with pt and wife regarding stairs, bed mobility, transfers, safety etc., see note for further details; pt and wife upset about DME not being here, also with questions about HHPT; I have reassured the pt and wife that [name] will contact them regarding setting up appt times, I spoke to [place] from [place] myself;  [employer], CM speaking to pt and attempting to resolve DME issues with [employer] upon my departure; All questions and concerns addressed with this pt and his wife and they expressed their gratitude.   Follow Up Recommendations  Home health PT     Does the patient have the potential to tolerate intense rehabilitation     Barriers to Discharge        Equipment Recommendations  Rolling walker with 5" wheels    Recommendations for Other Services OT consult  Frequency 7X/week   Progress towards PT Goals Progress towards PT goals: Progressing toward goals  Plan Current plan remains appropriate    Precautions / Restrictions Precautions Precautions: Knee Precaution Comments: Hx of OA bil shoulders - limited motion Required Braces or Orthoses: Knee Immobilizer - Right Knee Immobilizer - Right: Discontinue once straight leg raise with < 10 degree lag Restrictions Other Position/Activity Restrictions: WBAT   Pertinent Vitals/Pain Pain escalating; ice provided and pt was premedicated    Mobility  Bed Mobility Bed Mobility: Sit to Supine;Supine to Sit Supine to Sit: 5: Supervision Sit to Supine: 5: Supervision Details for Bed Mobility Assistance: cues for technique and completion of task'; reviewed use of leg lifter if he should need it  Transfers Transfers: Sit to  Stand;Stand to Sit Sit to Stand: 5: Supervision Stand to Sit: 5: Supervision Details for Transfer Assistance: verbal cues for hand placement and R LE management. Ambulation/Gait Ambulation/Gait Assistance: 5: Supervision Ambulation Distance (Feet): 60 Feet Assistive device: Rolling walker Ambulation/Gait Assistance Details: cues for RW technique, step through Gait Pattern: Step-to pattern;Step-through pattern;Antalgic Gait velocity: improved General Gait Details: emcouraging incr wt on RLE to help pain control/exacerbation of shoulder issues Stairs: Yes Stairs Assistance: 4: Min guard Stair Management Technique: One rail Right;With crutches;Step to pattern Number of Stairs: 5 (and reviewed up/down 1 with  crutches)    Exercises Total Joint Exercises Ankle Circles/Pumps: AROM;Both;20 reps;Supine Quad Sets: AROM;Strengthening;Both;10 reps Heel Slides: AAROM;Right;10 reps Goniometric [name]: 3-70*   PT Diagnosis:    PT Problem List:   PT Treatment Interventions:     PT Goals (current goals can now be found in the care plan section) Acute Rehab PT Goals Patient Stated Goal: return to PLOF Time For Goal Achievement: 08/30/13 Potential to Achieve Goals: Good  Visit Information  Last PT Received On: 08/27/13 Assistance Needed: +1 History of Present Illness: s./p R TKA    Subjective Data  Patient Stated Goal: return to PLOF   Cognition  Cognition Arousal/Alertness: Awake/alert Behavior During Therapy: WFL for tasks assessed/performed Overall Cognitive Status: Within Functional Limits for tasks assessed    Balance  Dynamic Standing Balance Dynamic Standing - Balance Support: No upper extremity supported;Bilateral upper extremity supported;During functional activity Dynamic Standing - Level of Assistance: 5: Stand by assistance  End of Session PT - End of Session Equipment Utilized During Treatment: Right knee immobilizer  Activity Tolerance: Patient tolerated treatment  well Patient left: in bed;with call bell/phone within reach;with nursing/sitter in room Nurse Communication: Patient requests pain meds;Mobility status   GP     [facility] 08/27/2013, 2:04 PM

## 2013-08-27 NOTE — Progress Notes (Signed)
Physical Therapy Treatment Patient Details Name: [name] [name] MRN: [id number] DOB: [birth year] Today's Date: 08/27/2013 Time: 2536-6440 PT Time Calculation (min): 45 min  PT Assessment / Plan / Recommendation  History of Present Illness s./p R TKA   PT Comments   Progressing well; pt with multiple questions regarding techniques, KI, positioning, etc;   Follow Up Recommendations  Home health PT     Does the patient have the potential to tolerate intense rehabilitation     Barriers to Discharge        Equipment Recommendations  Rolling walker with 5" wheels    Recommendations for Other Services OT consult  Frequency 7X/week   Progress towards PT Goals Progress towards PT goals: Progressing toward goals  Plan Current plan remains appropriate    Precautions / Restrictions Precautions Precautions: Knee Precaution Comments: Hx of OA bil shoulders - limited motion Required Braces or Orthoses: Knee Immobilizer - Right Knee Immobilizer - Right: Discontinue once straight leg raise with < 10 degree lag Restrictions Weight Bearing Restrictions: No Other Position/Activity Restrictions: WBAT   Pertinent Vitals/Pain Pain controlled most of session, incr with knee flexion    Mobility  Bed Mobility Bed Mobility: Sit to Supine Supine to Sit: 4: Min guard;HOB elevated Sit to Supine: 5: Supervision;HOB flat Details for Bed Mobility Assistance: cues for technique and completion of task'; reviewed use of leg lifter if he should need it  Transfers Transfers: Sit to Stand;Stand to Sit Sit to Stand: 5: Supervision Stand to Sit: 5: Supervision Details for Transfer Assistance: verbal cues for hand placement and R LE management. Ambulation/Gait Ambulation/Gait Assistance: 5: Supervision Ambulation Distance (Feet): 260 Feet Assistive device: Rolling walker Ambulation/Gait Assistance Details: cues for step through, heel contact Gait Pattern: Step-to pattern;Step-through  pattern;Antalgic Gait velocity: improved General Gait Details: emcouraging incr wt on RLE to help pain control/exacerbation of shoulder issues Stairs: Yes Stairs Assistance: 4: Min guard Stair Management Technique: One rail Right;With crutches;Step to pattern Number of Stairs: 5    Exercises Total Joint Exercises Ankle Circles/Pumps: AROM;Both;20 reps;Supine Quad Sets: AROM;Strengthening;Both;10 reps Heel Slides: AAROM;Right;10 reps Goniometric [name]: 3-70*   PT Diagnosis:    PT Problem List:   PT Treatment Interventions:     PT Goals (current goals can now be found in the care plan section) Acute Rehab PT Goals Patient Stated Goal: return to PLOF Time For Goal Achievement: 08/30/13 Potential to Achieve Goals: Good  Visit Information  Last PT Received On: 08/27/13 Assistance Needed: +1 History of Present Illness: s./p R TKA    Subjective Data  Patient Stated Goal: return to PLOF   Cognition  Cognition Arousal/Alertness: Awake/alert Behavior During Therapy: WFL for tasks assessed/performed Overall Cognitive Status: Within Functional Limits for tasks assessed    Balance  Balance Balance Assessed: Yes Dynamic Standing Balance Dynamic Standing - Balance Support: No upper extremity supported;Bilateral upper extremity supported;During functional activity Dynamic Standing - Level of Assistance: 5: Stand by assistance  End of Session PT - End of Session Equipment Utilized During Treatment: Right knee immobilizer Activity Tolerance: Patient tolerated treatment well Patient left: in bed;with call bell/phone within reach;with nursing/sitter in room Nurse Communication: Patient requests pain meds;Mobility status CPM Right Knee CPM Right Knee: Off   GP     [facility] 08/27/2013, 11:46 AM

## 2013-08-27 NOTE — Progress Notes (Signed)
   Subjective: 2 Days Post-Op Procedure(s) (LRB): RIGHT TOTAL KNEE ARTHROPLASTY (Right) Patient reports pain as mild.   Patient seen in rounds with Dr. Lequita Halt. Patient is well, but has had some minor complaints of pain in the knee, requiring pain medications Patient is ready to go home today.  Objective: Vital signs in last 24 hours: Temp:  [98.3 F (36.8 C)-98.5 F (36.9 C)] 98.5 F (36.9 C) (12/03 0619) Pulse Rate:  [61-67] 61 (12/03 0619) Resp:  [14-16] 16 (12/03 0619) BP: (91-115)/(46-67) 97/61 mmHg (12/03 0619) SpO2:  [96 %-100 %] 99 % (12/03 0619)  Intake/Output from previous day:  Intake/Output Summary (Last 24 hours) at 08/27/13 0754 Last data filed at 08/26/13 1814  Gross per 24 hour  Intake 1093.83 ml  Output   1025 ml  Net  68.83 ml    Intake/Output this shift:    Labs:  Recent Labs  08/26/13 0540 08/27/13 0450  HGB 13.0 11.5*    Recent Labs  08/26/13 0540 08/27/13 0450  WBC 11.1* 12.7*  RBC 3.87* 3.34*  HCT 36.9* 32.2*  PLT 211 202    Recent Labs  08/26/13 0540 08/27/13 0450  NA 135 136  K 4.1 4.1  CL 101 102  CO2 28 28  BUN 16 18  CREATININE 0.84 0.90  GLUCOSE 167* 123*  CALCIUM 8.2* 8.2*   No results found for this basename: LABPT, INR,  in the last 72 hours  EXAM: General - Patient is Alert, Appropriate, Oriented and mildly anxious Extremity - Neurovascular intact Sensation intact distally Dorsiflexion/Plantar flexion intact Incision - clean, dry, no drainage, healing Motor Function - intact, moving foot and toes well on exam.   Assessment/Plan: 2 Days Post-Op Procedure(s) (LRB): RIGHT TOTAL KNEE ARTHROPLASTY (Right) Procedure(s) (LRB): RIGHT TOTAL KNEE ARTHROPLASTY (Right) Past Medical History  Diagnosis Date  . ALLERGIC RHINITIS 04/22/2007  . HYPERCHOLESTEROLEMIA 04/22/2007  . HYPERLIPIDEMIA 07/29/2007  . NEPHROLITHIASIS, HX OF 07/29/2007  . Osteoarth NOS-Unspec 04/22/2007  . Complication of anesthesia   .  Headache(784.0)     migraines  . DDD (degenerative disc disease), cervical   . Shoulder pain, acute     bilateral  . History of kidney stones   . History of skin cancer   . GERD (gastroesophageal reflux disease)     infrequently   Principal Problem:   OA (osteoarthritis) of knee  Estimated body mass index is 27.05 kg/(m^2) as calculated from the following:   Height as of this encounter: 5\' 9"  (1.753 m).   Weight as of this encounter: 83.122 kg (183 lb 4 oz). Advance diet Up with therapy Discharge home with home health Diet - Cardiac diet Follow up - in 2 weeks Activity - WBAT Disposition - Home Condition Upon Discharge - Good D/C Meds - See DC Summary DVT Prophylaxis - Xarelto  [name] [name] 08/27/2013, 7:54 AM

## 2013-08-27 NOTE — Progress Notes (Signed)
Pt to d/c home with home health. DME delivered to room. Pt medicated for pain at d/c. AVS reviewed and "My Chart" discussed with pt. Pt capable of verbalizing medications and follow-up appointments and shown how to do dsg changes. Remains hemodynamically stable. No signs and symptoms of distress. Educated pt to return to ER in the case of SOB, dizziness, or chest pain.

## 2013-08-27 NOTE — Anesthesia Postprocedure Evaluation (Signed)
Anesthesia Post Note  Patient: [name] [name]  Procedure(s) Performed: Procedure(s) (LRB): RIGHT TOTAL KNEE ARTHROPLASTY (Right)  Anesthesia type: Spinal  Patient location: PACU  Post pain: Pain level controlled  Post assessment: Post-op Vital signs reviewed  Last Vitals: BP 97/61  Pulse 61  Temp(Src) 36.9 C (Oral)  Resp 20  Ht 5\' 9"  (1.753 m)  Wt 183 lb 4 oz (83.122 kg)  BMI 27.05 kg/m2  SpO2 99%  Post vital signs: Reviewed  Level of consciousness: sedated  Complications: No apparent anesthesia complications

## 2013-08-27 NOTE — Discharge Summary (Signed)
Physician Discharge Summary   Patient ID: [name] [name] MRN: [id number] DOB/AGE: May 04, 1951 62 y.o.  Admit date: 08/25/2013 Discharge date: 08/27/2013  Primary Diagnosis:  Osteoarthritis Right knee(s)  Admission Diagnoses:  Past Medical History  Diagnosis Date  . ALLERGIC RHINITIS 04/22/2007  . HYPERCHOLESTEROLEMIA 04/22/2007  . HYPERLIPIDEMIA 07/29/2007  . NEPHROLITHIASIS, HX OF 07/29/2007  . Osteoarth NOS-Unspec 04/22/2007  . Complication of anesthesia   . Headache(784.0)     migraines  . DDD (degenerative disc disease), cervical   . Shoulder pain, acute     bilateral  . History of kidney stones   . History of skin cancer   . GERD (gastroesophageal reflux disease)     infrequently   Discharge Diagnoses:   Principal Problem:   OA (osteoarthritis) of knee  Estimated body mass index is 27.05 kg/(m^2) as calculated from the following:   Height as of this encounter: 5\' 9"  (1.753 m).   Weight as of this encounter: 83.122 kg (183 lb 4 oz).  Procedure:  Procedure(s) (LRB): RIGHT TOTAL KNEE ARTHROPLASTY (Right)   Consults: None  HPI: [name] [name] is a 62 y.o. year old male with end stage OA of his right knee with progressively worsening pain and dysfunction. He has constant pain, with activity and at rest and significant functional deficits with difficulties even with ADLs. He has had extensive non-op management including analgesics, injections of cortisone and viscosupplements, and home exercise program, but remains in significant pain with significant dysfunction. Radiographs show severe bone on bone changes medial and patellofemoral with large varus deformity. He presents now for right Total Knee Arthroplasty.   Laboratory Data: Admission on 08/25/2013, Discharged on 08/27/2013  Component Date Value Range Status  . ABO/RH(D) 08/25/2013 A POS   Final  . Antibody Screen 08/25/2013 NEG   Final  . Sample Expiration 08/25/2013 08/28/2013   Final  . ABO/RH(D) 08/25/2013 A POS    Final  . WBC 08/26/2013 11.1* 4.0 - 10.5 K/uL Final  . RBC 08/26/2013 3.87* 4.22 - 5.81 MIL/uL Final  . Hemoglobin 08/26/2013 13.0  13.0 - 17.0 g/dL Final  . HCT 78/29/5621 36.9* 39.0 - 52.0 % Final  . MCV 08/26/2013 95.3  78.0 - 100.0 fL Final  . MCH 08/26/2013 33.6  26.0 - 34.0 pg Final  . MCHC 08/26/2013 35.2  30.0 - 36.0 g/dL Final  . RDW 30/86/5784 12.8  11.5 - 15.5 % Final  . Platelets 08/26/2013 211  150 - 400 K/uL Final  . Sodium 08/26/2013 135  135 - 145 mEq/L Final  . Potassium 08/26/2013 4.1  3.5 - 5.1 mEq/L Final  . Chloride 08/26/2013 101  96 - 112 mEq/L Final  . CO2 08/26/2013 28  19 - 32 mEq/L Final  . Glucose, Bld 08/26/2013 167* 70 - 99 mg/dL Final  . BUN 69/62/9528 16  6 - 23 mg/dL Final  . Creatinine, Ser 08/26/2013 0.84  0.50 - 1.35 mg/dL Final  . Calcium 41/32/4401 8.2* 8.4 - 10.5 mg/dL Final  . GFR calc non Af Amer 08/26/2013 >90  >90 mL/min Final  . GFR calc Af Amer 08/26/2013 >90  >90 mL/min Final   Comment: (NOTE)                          The eGFR has been calculated using the CKD EPI equation.  This calculation has not been validated in all clinical situations.                          eGFR's persistently <90 mL/min signify possible Chronic Kidney                          Disease.  . WBC 08/27/2013 12.7* 4.0 - 10.5 K/uL Final  . RBC 08/27/2013 3.34* 4.22 - 5.81 MIL/uL Final  . Hemoglobin 08/27/2013 11.5* 13.0 - 17.0 g/dL Final  . HCT 40/98/1191 32.2* 39.0 - 52.0 % Final  . MCV 08/27/2013 96.4  78.0 - 100.0 fL Final  . MCH 08/27/2013 34.4* 26.0 - 34.0 pg Final  . MCHC 08/27/2013 35.7  30.0 - 36.0 g/dL Final  . RDW 47/82/9562 13.2  11.5 - 15.5 % Final  . Platelets 08/27/2013 202  150 - 400 K/uL Final  . Sodium 08/27/2013 136  135 - 145 mEq/L Final  . Potassium 08/27/2013 4.1  3.5 - 5.1 mEq/L Final  . Chloride 08/27/2013 102  96 - 112 mEq/L Final  . CO2 08/27/2013 28  19 - 32 mEq/L Final  . Glucose, Bld 08/27/2013 123* 70 - 99 mg/dL  Final  . BUN 13/04/6577 18  6 - 23 mg/dL Final  . Creatinine, Ser 08/27/2013 0.90  0.50 - 1.35 mg/dL Final  . Calcium 46/96/2952 8.2* 8.4 - 10.5 mg/dL Final  . GFR calc non Af Amer 08/27/2013 89* >90 mL/min Final  . GFR calc Af Amer 08/27/2013 >90  >90 mL/min Final   Comment: (NOTE)                          The eGFR has been calculated using the CKD EPI equation.                          This calculation has not been validated in all clinical situations.                          eGFR's persistently <90 mL/min signify possible Chronic Kidney                          Disease.  Hospital Outpatient Visit on 08/13/2013  Component Date Value Range Status  . WBC 08/13/2013 4.3  4.0 - 10.5 K/uL Final  . RBC 08/13/2013 4.56  4.22 - 5.81 MIL/uL Final  . Hemoglobin 08/13/2013 15.7  13.0 - 17.0 g/dL Final  . HCT 84/13/2440 44.6  39.0 - 52.0 % Final  . MCV 08/13/2013 97.8  78.0 - 100.0 fL Final  . MCH 08/13/2013 34.4* 26.0 - 34.0 pg Final  . MCHC 08/13/2013 35.2  30.0 - 36.0 g/dL Final  . RDW 07/22/2535 12.9  11.5 - 15.5 % Final  . Platelets 08/13/2013 218  150 - 400 K/uL Final  . Sodium 08/13/2013 135  135 - 145 mEq/L Final  . Potassium 08/13/2013 4.3  3.5 - 5.1 mEq/L Final  . Chloride 08/13/2013 99  96 - 112 mEq/L Final  . CO2 08/13/2013 28  19 - 32 mEq/L Final  . Glucose, Bld 08/13/2013 96  70 - 99 mg/dL Final  . BUN 64/40/3474 26* 6 - 23 mg/dL Final  . Creatinine, Ser 08/13/2013 1.05  0.50 - 1.35 mg/dL Final  . Calcium  08/13/2013 9.2  8.4 - 10.5 mg/dL Final  . Total Protein 08/13/2013 7.3  6.0 - 8.3 g/dL Final  . Albumin 16/06/9603 3.7  3.5 - 5.2 g/dL Final  . AST 54/05/8118 43* 0 - 37 U/L Final   NO VISIBLE HEMOLYSIS  . ALT 08/13/2013 42  0 - 53 U/L Final  . Alkaline Phosphatase 08/13/2013 35* 39 - 117 U/L Final  . Total Bilirubin 08/13/2013 0.5  0.3 - 1.2 mg/dL Final  . GFR calc non Af Amer 08/13/2013 74* >90 mL/min Final  . GFR calc Af Amer 08/13/2013 86* >90 mL/min Final   Comment:  (NOTE)                          The eGFR has been calculated using the CKD EPI equation.                          This calculation has not been validated in all clinical situations.                          eGFR's persistently <90 mL/min signify possible Chronic Kidney                          Disease.  Marland Kitchen aPTT 08/13/2013 26  24 - 37 seconds Final  . Color, Urine 08/13/2013 YELLOW  YELLOW Final  . APPearance 08/13/2013 CLEAR  CLEAR Final  . Specific Gravity, Urine 08/13/2013 1.021  1.005 - 1.030 Final  . pH 08/13/2013 7.5  5.0 - 8.0 Final  . Glucose, UA 08/13/2013 NEGATIVE  NEGATIVE mg/dL Final  . Hgb urine dipstick 08/13/2013 NEGATIVE  NEGATIVE Final  . Bilirubin Urine 08/13/2013 NEGATIVE  NEGATIVE Final  . Ketones, ur 08/13/2013 NEGATIVE  NEGATIVE mg/dL Final  . Protein, ur 14/78/2956 NEGATIVE  NEGATIVE mg/dL Final  . Urobilinogen, UA 08/13/2013 0.2  0.0 - 1.0 mg/dL Final  . Nitrite 21/30/8657 NEGATIVE  NEGATIVE Final  . Leukocytes, UA 08/13/2013 NEGATIVE  NEGATIVE Final   MICROSCOPIC NOT DONE ON URINES WITH NEGATIVE PROTEIN, BLOOD, LEUKOCYTES, NITRITE, OR GLUCOSE <1000 mg/dL.  Marland Kitchen MRSA, PCR 08/13/2013 NEGATIVE  NEGATIVE Final  . Staphylococcus aureus 08/13/2013 NEGATIVE  NEGATIVE Final   Comment:                                 The Xpert SA Assay (FDA                          approved for NASAL specimens                          in patients over 109 years of age),                          is one component of                          a comprehensive surveillance                          program.  Test performance has  been validated by [facility] for patients greater                          than or equal to 21 year old.                          It is not intended                          to diagnose infection nor to                          guide or monitor treatment.  . Prothrombin Time 08/13/2013 12.3  11.6 - 15.2 seconds Final  .  INR 08/13/2013 0.93  0.00 - 1.49 Final  Lab on 07/16/2013  Component Date Value Range Status  . Sodium 07/16/2013 141  135 - 145 mEq/L Final  . Potassium 07/16/2013 5.2* 3.5 - 5.1 mEq/L Final  . Chloride 07/16/2013 101  96 - 112 mEq/L Final  . CO2 07/16/2013 33* 19 - 32 mEq/L Final  . Glucose, Bld 07/16/2013 97  70 - 99 mg/dL Final  . BUN 16/06/9603 20  6 - 23 mg/dL Final  . Creatinine, Ser 07/16/2013 1.1  0.4 - 1.5 mg/dL Final  . Calcium 54/05/8118 9.8  8.4 - 10.5 mg/dL Final  . GFR 14/78/2956 70.53  >60.00 mL/min Final  . WBC 07/16/2013 5.1  4.5 - 10.5 K/uL Final  . RBC 07/16/2013 4.80  4.22 - 5.81 Mil/uL Final  . Hemoglobin 07/16/2013 16.5  13.0 - 17.0 g/dL Final  . HCT 21/30/8657 47.8  39.0 - 52.0 % Final  . MCV 07/16/2013 99.6  78.0 - 100.0 fl Final  . MCHC 07/16/2013 34.6  30.0 - 36.0 g/dL Final  . RDW 84/69/6295 13.3  11.5 - 14.6 % Final  . Platelets 07/16/2013 272.0  150.0 - 400.0 K/uL Final  . Neutrophils Relative % 07/16/2013 50.7  43.0 - 77.0 % Final  . Lymphocytes Relative 07/16/2013 34.8  12.0 - 46.0 % Final  . Monocytes Relative 07/16/2013 8.6  3.0 - 12.0 % Final  . Eosinophils Relative 07/16/2013 5.2* 0.0 - 5.0 % Final  . Basophils Relative 07/16/2013 0.7  0.0 - 3.0 % Final  . Neutro Abs 07/16/2013 2.6  1.4 - 7.7 K/uL Final  . Lymphs Abs 07/16/2013 1.8  0.7 - 4.0 K/uL Final  . Monocytes Absolute 07/16/2013 0.4  0.1 - 1.0 K/uL Final  . Eosinophils Absolute 07/16/2013 0.3  0.0 - 0.7 K/uL Final  . Basophils Absolute 07/16/2013 0.0  0.0 - 0.1 K/uL Final  . Total Bilirubin 07/16/2013 1.0  0.3 - 1.2 mg/dL Final  . Bilirubin, Direct 07/16/2013 0.2  0.0 - 0.3 mg/dL Final  . Alkaline Phosphatase 07/16/2013 38* 39 - 117 U/L Final  . AST 07/16/2013 33  0 - 37 U/L Final  . ALT 07/16/2013 28  0 - 53 U/L Final  . Total Protein 07/16/2013 7.1  6.0 - 8.3 g/dL Final  . Albumin 28/41/3244 3.9  3.5 - 5.2 g/dL Final  . TSH 09/27/7251 1.85  0.35 - 5.50 uIU/mL Final  . PSA 07/16/2013  2.77  0.10 - 4.00 ng/mL Final  . Color, UA 07/16/2013 yellow  Final  . Clarity, UA 07/16/2013 clear   Final  . Glucose, UA 07/16/2013 n   Final  . Bilirubin, UA 07/16/2013 n   Final  . Ketones, UA 07/16/2013 n   Final  . Spec Grav, UA 07/16/2013 1.010   Final  . Blood, UA 07/16/2013 n   Final  . pH, UA 07/16/2013 7.5   Final  . Protein, UA 07/16/2013 n   Final  . Urobilinogen, UA 07/16/2013 0.2   Final  . Nitrite, UA 07/16/2013 n   Final  . Leukocytes, UA 07/16/2013 Negative   Final  . Cholesterol 07/16/2013 232* 0 - 200 mg/dL Final   ATP III Classification       Desirable:  < 200 mg/dL               Borderline High:  200 - 239 mg/dL          High:  > = 161 mg/dL  . Triglycerides 07/16/2013 71.0  0.0 - 149.0 mg/dL Final   Normal:  <096 mg/dLBorderline High:  150 - 199 mg/dL  . HDL 07/16/2013 82.70  >39.00 mg/dL Final  . VLDL 04/54/0981 14.2  0.0 - 40.0 mg/dL Final  . Total CHOL/HDL Ratio 07/16/2013 3   Final                  Men          Women1/2 Average Risk     3.4          3.3Average Risk          5.0          4.42X Average Risk          9.6          7.13X Average Risk          15.0          11.0                      . Direct LDL 07/16/2013 135.0   Final   Optimal:  <100 mg/dLNear or Above Optimal:  100-129 mg/dLBorderline High:  130-159 mg/dLHigh:  160-189 mg/dLVery High:  >190 mg/dL     X-Rays:No results found.  EKG: Orders placed in visit on 07/22/13  . EKG 12-LEAD     Hospital Course: [name] [name] is a 62 y.o. who was admitted to [facility]. They were brought to the operating room on 08/25/2013 and underwent Procedure(s): RIGHT TOTAL KNEE ARTHROPLASTY.  Patient tolerated the procedure well and was later transferred to the recovery room and then to the orthopaedic floor for postoperative care.  They were given PO and IV analgesics for pain control following their surgery.  They were given 24 hours of postoperative antibiotics of  Anti-infectives   Start      Dose/Rate Route Frequency Ordered Stop   08/26/13 1000  valACYclovir (VALTREX) tablet 500 mg  Status:  Discontinued     500 mg Oral Daily 08/25/13 1812 08/27/13 1913   08/26/13 0200  vancomycin (VANCOCIN) IVPB 1000 mg/200 mL premix     1,000 mg 200 mL/hr over 60 Minutes Intravenous Every 12 hours 08/25/13 1812 08/26/13 0230   08/25/13 1051  vancomycin (VANCOCIN) IVPB 1000 mg/200 mL premix     1,000 mg 200 mL/hr over 60 Minutes Intravenous On call to O.R. 08/25/13 1051 08/25/13 1447     and started on DVT prophylaxis in the form of Xarelto.  PT and OT were ordered for total joint protocol.  Discharge planning consulted to help with postop disposition and equipment needs.  Patient had a decnt night on the evening of surgery.  They started to get up OOB with therapy on day one. Patient seen in rounds with Dr. Lequita Halt. Blood pressure was soft last night and early this morning. Gave fluids and rechecked pressure. Hemovac drain was pulled without difficulty.  Continued to work with therapy into day two.  Dressing was changed on day two and the incision was healing well. Patient was seen in rounds and was ready to go home later on day two.   Discharge Medications: Prior to Admission medications   Medication Sig Start Date End Date Taking? Authorizing Provider  allopurinol (ZYLOPRIM) 100 MG tablet Take 100 mg by mouth every morning.   Yes Historical Provider, MD  pregabalin (LYRICA) 75 MG capsule Take 75 mg by mouth 2 (two) times daily.    Yes Historical Provider, MD  valACYclovir (VALTREX) 500 MG tablet Take 500 mg by mouth daily.   Yes Historical Provider, MD  cyclobenzaprine (FLEXERIL) 10 MG tablet Take 1 tablet (10 mg total) by mouth 3 (three) times daily as needed for muscle spasms. 08/27/13   Jametta Moorehead, PA-C  HYDROmorphone (DILAUDID) 2 MG tablet Take 1-2 tablets (2-4 mg total) by mouth every 4 (four) hours as needed for severe pain. 08/27/13   Dontray Haberland, PA-C  pravastatin  (PRAVACHOL) 40 MG tablet Take 40 mg by mouth every evening.    Historical Provider, MD  psyllium (REGULOID) 0.52 G capsule Take 2 capsules by mouth 2 (two) times daily.    Historical Provider, MD  rivaroxaban (XARELTO) 10 MG TABS tablet Take 1 tablet (10 mg total) by mouth daily with breakfast. Take Xarelto for two and a half more weeks, then discontinue Xarelto. Once the patient has completed the Xarelto, they may resume the 81 mg Aspirin. 08/27/13   Karn Derk Julien Girt, PA-C  SUMAtriptan (IMITREX) 100 MG tablet Take 1 tablet (100 mg total) by mouth every 2 (two) hours as needed for migraine or headache. May repeat in 2 hours if headache persists or recurs. 08/15/13   Gordy Savers, MD  traMADol (ULTRAM) 50 MG tablet Take 1-2 tablets (50-100 mg total) by mouth every 6 (six) hours as needed (mild pain). 08/27/13   Lissy Deuser Julien Girt, PA-C   Discharge home with home health  Diet - Cardiac diet  Follow up - in 2 weeks  Activity - WBAT  Disposition - Home  Condition Upon Discharge - Good  D/C Meds - See DC Summary  DVT Prophylaxis - Xarelto        Discharge Orders   Future Orders Complete By Expires   Call MD / Call 911  As directed    Comments:     If you experience chest pain or shortness of breath, CALL 911 and be transported to the hospital emergency room.  If you develope a fever above 101 F, pus (white drainage) or increased drainage or redness at the wound, or calf pain, call your surgeon's office.   Change dressing  As directed    Comments:     Change dressing daily with sterile 4 x 4 inch gauze dressing and apply TED hose. Do not submerge the incision under water.   Constipation Prevention  As directed    Comments:     Drink plenty of fluids.  Prune juice may be helpful.  You may use a stool softener, such  as Colace (over the counter) 100 mg twice a day.  Use MiraLax (over the counter) for constipation as needed.   Diet - low sodium heart healthy  As directed    Discharge  instructions  As directed    Comments:     Pick up stool softner and laxative for home. Do not submerge incision under water. May shower. Continue to use ice for pain and swelling from surgery.  Take Xarelto for two and a half more weeks, then discontinue Xarelto. Once the patient has completed the Xarelto, they may resume the 81 mg Aspirin.   Do not put a pillow under the knee. Place it under the heel.  As directed    Do not sit on low chairs, stoools or toilet seats, as it may be difficult to get up from low surfaces  As directed    Driving restrictions  As directed    Comments:     No driving until released by the physician.   Increase activity slowly as tolerated  As directed    Lifting restrictions  As directed    Comments:     No lifting until released by the physician.   Patient may shower  As directed    Comments:     You may shower without a dressing once there is no drainage.  Do not wash over the wound.  If drainage remains, do not shower until drainage stops.   TED hose  As directed    Comments:     Use stockings (TED hose) for 3 weeks on both leg(s).  You may remove them at night for sleeping.   Weight bearing as tolerated  As directed    Questions:     Laterality:     Extremity:         Medication List    STOP taking these medications       aspirin EC 81 MG tablet     EPIPEN 0.3 mg/0.3 mL Soaj injection  Generic drug:  EPINEPHrine     fish oil-omega-3 fatty acids 1000 MG capsule     multivitamin with minerals Tabs tablet     naproxen 500 MG tablet  Commonly known as:  NAPROSYN     Red Yeast Rice Extract 600 MG Caps     TURMERIC CURCUMIN PO     vitamin C 500 MG tablet  Commonly known as:  ASCORBIC ACID      TAKE these medications       allopurinol 100 MG tablet  Commonly known as:  ZYLOPRIM  Take 100 mg by mouth every morning.     cyclobenzaprine 10 MG tablet  Commonly known as:  FLEXERIL  Take 1 tablet (10 mg total) by mouth 3 (three) times  daily as needed for muscle spasms.     HYDROmorphone 2 MG tablet  Commonly known as:  DILAUDID  Take 1-2 tablets (2-4 mg total) by mouth every 4 (four) hours as needed for severe pain.     pravastatin 40 MG tablet  Commonly known as:  PRAVACHOL  Take 40 mg by mouth every evening.     pregabalin 75 MG capsule  Commonly known as:  LYRICA  Take 75 mg by mouth 2 (two) times daily.     psyllium 0.52 G capsule  Commonly known as:  REGULOID  Take 2 capsules by mouth 2 (two) times daily.     rivaroxaban 10 MG Tabs tablet  Commonly known as:  XARELTO  - Take 1  tablet (10 mg total) by mouth daily with breakfast. Take Xarelto for two and a half more weeks, then discontinue Xarelto.  - Once the patient has completed the Xarelto, they may resume the 81 mg Aspirin.     SUMAtriptan 100 MG tablet  Commonly known as:  IMITREX  Take 1 tablet (100 mg total) by mouth every 2 (two) hours as needed for migraine or headache. May repeat in 2 hours if headache persists or recurs.     traMADol 50 MG tablet  Commonly known as:  ULTRAM  Take 1-2 tablets (50-100 mg total) by mouth every 6 (six) hours as needed (mild pain).     valACYclovir 500 MG tablet  Commonly known as:  VALTREX  Take 500 mg by mouth daily.       Follow-up Information   Follow up with Loanne Drilling, MD. Schedule an appointment as soon as possible for a visit in 2 weeks.   Specialty:  Orthopedic Surgery   Contact information:   [address] [place] [address] [phone]       Signed: Patrica Duel 09/02/2013, 9:24 AM

## 2013-08-28 NOTE — Progress Notes (Signed)
Discharge summary sent to payer through [facility]  

## 2013-09-15 ENCOUNTER — Ambulatory Visit: Payer: 59 | Attending: Orthopedic Surgery

## 2013-09-15 DIAGNOSIS — R609 Edema, unspecified: Secondary | ICD-10-CM | POA: Insufficient documentation

## 2013-09-15 DIAGNOSIS — IMO0001 Reserved for inherently not codable concepts without codable children: Secondary | ICD-10-CM | POA: Insufficient documentation

## 2013-09-15 DIAGNOSIS — R262 Difficulty in walking, not elsewhere classified: Secondary | ICD-10-CM | POA: Insufficient documentation

## 2013-09-15 DIAGNOSIS — M25569 Pain in unspecified knee: Secondary | ICD-10-CM | POA: Insufficient documentation

## 2013-09-15 DIAGNOSIS — M6281 Muscle weakness (generalized): Secondary | ICD-10-CM | POA: Insufficient documentation

## 2013-09-16 ENCOUNTER — Ambulatory Visit: Payer: 59

## 2013-09-16 ENCOUNTER — Ambulatory Visit: Payer: 59 | Admitting: Physical Therapy

## 2013-09-17 ENCOUNTER — Ambulatory Visit: Payer: 59 | Admitting: Physical Therapy

## 2013-09-22 ENCOUNTER — Ambulatory Visit: Payer: 59 | Admitting: Physical Therapy

## 2013-09-23 ENCOUNTER — Ambulatory Visit: Payer: 59 | Admitting: Physical Therapy

## 2013-09-24 ENCOUNTER — Encounter: Payer: 59 | Admitting: Physical Therapy

## 2013-09-24 ENCOUNTER — Telehealth: Payer: Self-pay | Admitting: Internal Medicine

## 2013-09-24 NOTE — Telephone Encounter (Signed)
Patient Information:  Caller Name: Clydie Braun  Phone: [phone]  Patient: [name] [name]  Gender: Male  DOB: 1950-11-04  Age: 62 Years  PCP: Eleonore Chiquito (Family Practice > 63yrs old)  Office Follow Up:  Does the office need to follow up with this patient?: No  Instructions For The Office: N/A  RN Note:  Wife states patient just had a Bowel movement and urinated. Reviewed home care and call back parameters. She states she also has a call back into surgerons office. Understanding expressed.  Advised wife to closely mointor and call back for questions, changes or concerns.   Symptoms  Reason For Call & Symptoms: Patient had Knee replacement on 08/25/13.  He is constipated due to pain medications.  Last BM 2 days ago.  He states he cannot urinate . last time 05:00 a.m.  Treatment at home - Colace, Mag Citrate 1 bottle finished 20 minutes ago.  She states he is having anal /rectal pain . He has not had his miralax.Marland Kitchen He cannot push the stool out .leaking liquid around the stool.  She placed me on hold-  patient just now had a Bowel movement and urinated. She is now wanting to cancel call back.    Reviewed Health History In EMR: Yes  Reviewed Medications In EMR: Yes  Reviewed Allergies In EMR: Yes  Reviewed Surgeries / Procedures: Yes  Date of Onset of Symptoms: 09/21/2013  Guideline(s) Used:  Constipation  Disposition Per Guideline:   See Today in Office  Reason For Disposition Reached:   Leaking stool  Advice Given:  General Constipation Instructions:  Eat a high fiber diet.  Drink adequate liquids.  High Fiber Diet:  Try to eat fresh fruit and vegetables at each meal (peas, prunes, citrus, apples, beans, corn).  Eat more grain foods (bran flakes, bran muffins, graham crackers, oatmeal, brown rice, and whole wheat bread). Popcorn is a source of fiber.  Liquids:  Drink 6-8 glasses of water a day (Caution: certain medical conditions require fluid restriction).  Prune juice is  a natural laxative.  Avoid alcohol.  Osmotic Laxatives:  Miralax (polyethylene glycol 3350): Miralax is an "osmotic" agent which means that it binds water and causes water to be retained within the stool. You can use this laxative to treat occasional constipation. Do not use for more than 2 weeks without approval from your doctor. Generally, Miralax produces a bowel movement in 1 to 3 days. Side effects include diarrhea (especially at higher doses). If you are pregnant, discuss with your doctor before using. Available in the [place].  Call Back If:  You become worse  Patient Will Follow Care Advice:  YES

## 2013-09-29 ENCOUNTER — Ambulatory Visit: Payer: 59 | Attending: Orthopedic Surgery | Admitting: Physical Therapy

## 2013-09-29 DIAGNOSIS — M6281 Muscle weakness (generalized): Secondary | ICD-10-CM | POA: Insufficient documentation

## 2013-09-29 DIAGNOSIS — R262 Difficulty in walking, not elsewhere classified: Secondary | ICD-10-CM | POA: Insufficient documentation

## 2013-09-29 DIAGNOSIS — R609 Edema, unspecified: Secondary | ICD-10-CM | POA: Insufficient documentation

## 2013-09-29 DIAGNOSIS — M25569 Pain in unspecified knee: Secondary | ICD-10-CM | POA: Insufficient documentation

## 2013-09-29 DIAGNOSIS — IMO0001 Reserved for inherently not codable concepts without codable children: Secondary | ICD-10-CM | POA: Insufficient documentation

## 2013-10-01 ENCOUNTER — Ambulatory Visit: Payer: 59 | Admitting: Physical Therapy

## 2013-10-03 ENCOUNTER — Ambulatory Visit: Payer: 59 | Admitting: Physical Therapy

## 2013-10-06 ENCOUNTER — Ambulatory Visit: Payer: 59 | Admitting: Physical Therapy

## 2013-10-08 ENCOUNTER — Ambulatory Visit: Payer: 59 | Admitting: Physical Therapy

## 2013-10-10 ENCOUNTER — Ambulatory Visit: Payer: 59 | Admitting: Physical Therapy

## 2013-10-13 ENCOUNTER — Ambulatory Visit: Payer: 59 | Admitting: Physical Therapy

## 2013-10-15 ENCOUNTER — Ambulatory Visit: Payer: 59 | Admitting: Physical Therapy

## 2013-10-17 ENCOUNTER — Ambulatory Visit: Payer: 59 | Admitting: Physical Therapy

## 2013-10-20 ENCOUNTER — Ambulatory Visit (INDEPENDENT_AMBULATORY_CARE_PROVIDER_SITE_OTHER): Payer: 59 | Admitting: Internal Medicine

## 2013-10-20 ENCOUNTER — Encounter: Payer: Self-pay | Admitting: Internal Medicine

## 2013-10-20 ENCOUNTER — Ambulatory Visit: Payer: 59 | Admitting: Physical Therapy

## 2013-10-20 VITALS — BP 100/60 | HR 92 | Temp 98.5°F | Resp 20 | Ht 69.0 in | Wt 182.0 lb

## 2013-10-20 DIAGNOSIS — L089 Local infection of the skin and subcutaneous tissue, unspecified: Secondary | ICD-10-CM

## 2013-10-20 DIAGNOSIS — M179 Osteoarthritis of knee, unspecified: Secondary | ICD-10-CM

## 2013-10-20 DIAGNOSIS — M1710 Unilateral primary osteoarthritis, unspecified knee: Secondary | ICD-10-CM

## 2013-10-20 DIAGNOSIS — IMO0002 Reserved for concepts with insufficient information to code with codable children: Secondary | ICD-10-CM

## 2013-10-20 DIAGNOSIS — L723 Sebaceous cyst: Secondary | ICD-10-CM

## 2013-10-20 MED ORDER — DOXYCYCLINE HYCLATE 100 MG PO TABS: 100.0000 mg | ORAL_TABLET | Freq: Two times a day (BID) | ORAL | Status: DC

## 2013-10-20 NOTE — Progress Notes (Signed)
Pre-visit discussion using our clinic review tool. No additional management support is needed unless otherwise documented below in the visit note.  

## 2013-10-20 NOTE — Progress Notes (Signed)
Subjective:    Patient ID: [name] [name], male    DOB: 11-13-1950, 63 y.o.   MRN: [id number]  HPI  63 year old patient who is status post fairly recent right total knee replacement surgery. For the past several days he has noted a enlarging painful nodule in the right axilla area. There's been no fever or other constitutional complaints. There has been no drainage.  He requests disability forms to be completed  Past Medical History  Diagnosis Date  . ALLERGIC RHINITIS 04/22/2007  . HYPERCHOLESTEROLEMIA 04/22/2007  . HYPERLIPIDEMIA 07/29/2007  . NEPHROLITHIASIS, HX OF 07/29/2007  . Osteoarth NOS-Unspec 04/22/2007  . Complication of anesthesia   . Headache(784.0)     migraines  . DDD (degenerative disc disease), cervical   . Shoulder pain, acute     bilateral  . History of kidney stones   . History of skin cancer   . GERD (gastroesophageal reflux disease)     infrequently    History   Social History  . Marital Status: Married    Spouse Name: N/A    Number of Children: N/A  . Years of Education: N/A   Occupational History  . Not on file.   Social History Main Topics  . Smoking status: Never Smoker   . Smokeless tobacco: Never Used  . Alcohol Use: No  . Drug Use: No  . Sexual Activity: Not on file   Other Topics Concern  . Not on file   Social History Narrative  . No narrative on file    Past Surgical History  Procedure Laterality Date  . Appendectomy    . Knee arthroscopy      right x2  . Shoulder surgery      right  . Total knee arthroplasty Right 08/25/2013    Procedure: RIGHT TOTAL KNEE ARTHROPLASTY;  Surgeon: Gearlean Alf, MD;  Location: [facility];  Service: Orthopedics;  Laterality: Right;    Family History  Problem Relation Age of Onset  . Cancer Neg Hx     family hx -breast and lung Ca  . COPD Neg Hx     family hx    Allergies  Allergen Reactions  . Diphenhydramine Hcl Anaphylaxis and Rash    Only if it has dyes in it  . Shrimp [Shellfish  Allergy] Anaphylaxis and Rash  . Rivaroxaban Rash  . Amoxicillin Other (See Comments)    Unknown   . Erythromycin Other (See Comments)    unknown  . Betadine [Povidone Iodine] Rash  . Celebrex [Celecoxib] Rash  . Codeine Rash and Other (See Comments)    Blacked out  . Meperidine And Related Rash  . Methocarbamol Rash  . Penicillins Rash  . Vioxx [Rofecoxib] Rash    Current Outpatient Prescriptions on File Prior to Visit  Medication Sig Dispense Refill  . allopurinol (ZYLOPRIM) 100 MG tablet Take 100 mg by mouth every morning.      . cyclobenzaprine (FLEXERIL) 10 MG tablet Take 1 tablet (10 mg total) by mouth 3 (three) times daily as needed for muscle spasms.  80 tablet  0  . HYDROmorphone (DILAUDID) 2 MG tablet Take 1-2 tablets (2-4 mg total) by mouth every 4 (four) hours as needed for severe pain.  80 tablet  0  . pravastatin (PRAVACHOL) 40 MG tablet Take 40 mg by mouth every evening.      . pregabalin (LYRICA) 75 MG capsule Take 75 mg by mouth 2 (two) times daily.       Marland Kitchen  SUMAtriptan (IMITREX) 100 MG tablet Take 1 tablet (100 mg total) by mouth every 2 (two) hours as needed for migraine or headache. May repeat in 2 hours if headache persists or recurs.  10 tablet  2  . traMADol (ULTRAM) 50 MG tablet Take 1-2 tablets (50-100 mg total) by mouth every 6 (six) hours as needed (mild pain).  60 tablet  0  . valACYclovir (VALTREX) 500 MG tablet Take 500 mg by mouth daily.       No current facility-administered medications on file prior to visit.    BP 100/60  Pulse 92  Temp(Src) 98.5 F (36.9 C) (Oral)  Resp 20  Ht 5\' 9"  (1.753 m)  Wt 182 lb (82.555 kg)  BMI 26.86 kg/m2  SpO2 98%       Review of Systems  Musculoskeletal: Positive for arthralgias, back pain, gait problem, joint swelling, neck pain and neck stiffness.  Skin: Positive for wound.  Neurological: Positive for headaches.       Objective:   Physical Exam  Constitutional: He appears well-developed and  well-nourished. No distress.  Skin:  2 x 3 cm slightly fluctuant painful nodule in the right axilla area          Assessment & Plan:   Infected sebaceous cyst  After informed consent and local prep with alcohol swab,  the patient was locally anesthetized with 1% Xylocaine; a small incision was performed and a small amount of exudate returned.  Blunt dissection also performed with minimal return. Antibiotic ointment applied and the wound dressed Wound care discussed. The patient placed on antibiotic therapy

## 2013-10-20 NOTE — Patient Instructions (Signed)

## 2013-10-22 ENCOUNTER — Encounter: Payer: Self-pay | Admitting: Internal Medicine

## 2013-10-22 ENCOUNTER — Ambulatory Visit: Payer: 59 | Admitting: Physical Therapy

## 2013-10-24 ENCOUNTER — Ambulatory Visit: Payer: 59 | Admitting: Physical Therapy

## 2013-10-27 ENCOUNTER — Ambulatory Visit: Payer: 59 | Attending: Orthopedic Surgery | Admitting: Physical Therapy

## 2013-10-27 DIAGNOSIS — M25569 Pain in unspecified knee: Secondary | ICD-10-CM | POA: Insufficient documentation

## 2013-10-27 DIAGNOSIS — M6281 Muscle weakness (generalized): Secondary | ICD-10-CM | POA: Insufficient documentation

## 2013-10-27 DIAGNOSIS — IMO0001 Reserved for inherently not codable concepts without codable children: Secondary | ICD-10-CM | POA: Insufficient documentation

## 2013-10-27 DIAGNOSIS — R262 Difficulty in walking, not elsewhere classified: Secondary | ICD-10-CM | POA: Insufficient documentation

## 2013-10-27 DIAGNOSIS — R609 Edema, unspecified: Secondary | ICD-10-CM | POA: Insufficient documentation

## 2013-10-28 ENCOUNTER — Other Ambulatory Visit: Payer: Self-pay | Admitting: Internal Medicine

## 2013-10-29 ENCOUNTER — Ambulatory Visit: Payer: 59 | Admitting: Physical Therapy

## 2013-10-31 ENCOUNTER — Ambulatory Visit: Payer: 59 | Admitting: Physical Therapy

## 2013-11-03 ENCOUNTER — Ambulatory Visit: Payer: 59 | Admitting: Physical Therapy

## 2013-11-05 ENCOUNTER — Ambulatory Visit: Payer: 59 | Admitting: Physical Therapy

## 2013-11-07 ENCOUNTER — Encounter: Payer: 59 | Admitting: Physical Therapy

## 2013-12-11 ENCOUNTER — Encounter (HOSPITAL_COMMUNITY): Payer: Self-pay | Admitting: Pharmacy Technician

## 2013-12-16 ENCOUNTER — Other Ambulatory Visit: Payer: Self-pay | Admitting: Internal Medicine

## 2013-12-16 NOTE — Pre-Procedure Instructions (Signed)
[name] [name]  12/16/2013   Your procedure is scheduled on:  Thurs, April 2 @ 7:30 AM  Report to [facility]  at 5:30 AM.  Call this number if you have problems the morning of surgery: [phone]   Remember:   Do not eat food or drink liquids after midnight.   Take these medicines the morning of surgery with A SIP OF WATER: Allopurinol(Zyloprim),Valium(Diazepam),Depakote(Divalproex),Pain Pill(if needed),Lyrica(Pregabalin),and Valtrex(Valcyclovir)              Stop taking your Aspirin and Fish Oil. No Goody's,BC's,Aleve,Ibuprofen,or any Herbal Medications      Do not wear jewelry  Do not wear lotions, powders, or colognes. You may wear deodorant.  Men may shave face and neck.  Do not bring valuables to the hospital.  [facility] is not responsible                  for any belongings or valuables.               Contacts, dentures or bridgework may not be worn into surgery.  Leave suitcase in the car. After surgery it may be brought to your room.  For patients admitted to the hospital, discharge time is determined by your                treatment team.               Patients discharged the day of surgery will not be allowed to drive  home.    Special Instructions:   - Preparing for Surgery  Before surgery, you can play an important role.  Because skin is not sterile, your skin needs to be as free of germs as possible.  You can reduce the number of germs on you skin by washing with CHG (chlorahexidine gluconate) soap before surgery.  CHG is an antiseptic cleaner which kills germs and bonds with the skin to continue killing germs even after washing.  Please DO NOT use if you have an allergy to CHG or antibacterial soaps.  If your skin becomes reddened/irritated stop using the CHG and inform your nurse when you arrive at Short Stay.  Do not shave (including legs and underarms) for at least 48 hours prior to the first CHG shower.  You may shave your face.  Please  follow these instructions carefully:   1.  Shower with CHG Soap the night before surgery and the                                morning of Surgery.  2.  If you choose to wash your hair, wash your hair first as usual with your       normal shampoo.  3.  After you shampoo, rinse your hair and body thoroughly to remove the                      Shampoo.  4.  Use CHG as you would any other liquid soap.  You can apply chg directly       to the skin and wash gently with scrungie or a clean washcloth.  5.  Apply the CHG Soap to your body ONLY FROM THE NECK DOWN.        Do not use on open wounds or open sores.  Avoid contact with your eyes,       ears, mouth and  genitals (private parts).  Wash genitals (private parts)       with your normal soap.  6.  Wash thoroughly, paying special attention to the area where your surgery        will be performed.  7.  Thoroughly rinse your body with warm water from the neck down.  8.  DO NOT shower/wash with your normal soap after using and rinsing off       the CHG Soap.  9.  Pat yourself dry with a clean towel.            10.  Wear clean pajamas.            11.  Place clean sheets on your bed the night of your first shower and do not        sleep with pets.  Day of Surgery  Do not apply any lotions/deoderants the morning of surgery.  Please wear clean clothes to the hospital/surgery center.     Please read over the following fact sheets that you were given: Pain Booklet, Coughing and Deep Breathing, Blood Transfusion Information and Surgical Site Infection Prevention

## 2013-12-17 ENCOUNTER — Encounter (HOSPITAL_COMMUNITY)
Admission: RE | Admit: 2013-12-17 | Discharge: 2013-12-17 | Disposition: A | Discharge status: Home / Self Care | Payer: 59 | Source: Ambulatory Visit | Attending: Orthopedic Surgery | Admitting: Orthopedic Surgery

## 2013-12-17 ENCOUNTER — Telehealth: Payer: Self-pay | Admitting: Internal Medicine

## 2013-12-17 ENCOUNTER — Encounter (HOSPITAL_COMMUNITY): Payer: Self-pay

## 2013-12-17 DIAGNOSIS — Z01812 Encounter for preprocedural laboratory examination: Secondary | ICD-10-CM | POA: Insufficient documentation

## 2013-12-17 HISTORY — DX: Personal history of other diseases of the musculoskeletal system and connective tissue: Z87.39

## 2013-12-17 LAB — COMPREHENSIVE METABOLIC PANEL
ALK PHOS: 55 U/L (ref 39–117)
ALT: 23 U/L (ref 0–53)
AST: 33 U/L (ref 0–37)
Albumin: 3.7 g/dL (ref 3.5–5.2)
BILIRUBIN TOTAL: 0.4 mg/dL (ref 0.3–1.2)
BUN: 21 mg/dL (ref 6–23)
CHLORIDE: 102 meq/L (ref 96–112)
CO2: 29 mEq/L (ref 19–32)
Calcium: 9.5 mg/dL (ref 8.4–10.5)
Creatinine, Ser: 1.02 mg/dL (ref 0.50–1.35)
GFR calc Af Amer: 89 mL/min — ABNORMAL LOW (ref >90)
GFR calc non Af Amer: 77 mL/min — ABNORMAL LOW (ref >90)
Glucose, Bld: 93 mg/dL (ref 70–99)
POTASSIUM: 5 meq/L (ref 3.7–5.3)
SODIUM: 142 meq/L (ref 137–147)
TOTAL PROTEIN: 7.4 g/dL (ref 6.0–8.3)

## 2013-12-17 LAB — CBC WITH DIFFERENTIAL/PLATELET
BASOS ABS: 0 10*3/uL (ref 0.0–0.1)
BASOS PCT: 1 % (ref 0–1)
EOS ABS: 0.2 10*3/uL (ref 0.0–0.7)
Eosinophils Relative: 4 % (ref 0–5)
HCT: 44.9 % (ref 39.0–52.0)
Hemoglobin: 15.5 g/dL (ref 13.0–17.0)
Lymphocytes Relative: 41 % (ref 12–46)
Lymphs Abs: 2.2 10*3/uL (ref 0.7–4.0)
MCH: 31.4 pg (ref 26.0–34.0)
MCHC: 34.5 g/dL (ref 30.0–36.0)
MCV: 91.1 fL (ref 78.0–100.0)
MONOS PCT: 8 % (ref 3–12)
Monocytes Absolute: 0.4 10*3/uL (ref 0.1–1.0)
NEUTROS ABS: 2.4 10*3/uL (ref 1.7–7.7)
NEUTROS PCT: 46 % (ref 43–77)
PLATELETS: 267 10*3/uL (ref 150–400)
RBC: 4.93 MIL/uL (ref 4.22–5.81)
RDW: 15.3 % (ref 11.5–15.5)
WBC: 5.3 10*3/uL (ref 4.0–10.5)

## 2013-12-17 LAB — SURGICAL PCR SCREEN
MRSA, PCR: NEGATIVE
Staphylococcus aureus: NEGATIVE

## 2013-12-17 LAB — APTT: APTT: 27 s (ref 24–37)

## 2013-12-17 LAB — PROTIME-INR
INR: 0.97 (ref 0.00–1.49)
Prothrombin Time: 12.7 seconds (ref 11.6–15.2)

## 2013-12-17 NOTE — Progress Notes (Signed)
Patient does not want to wear the blood band for 7-8 days.  He understands that he will have sample drawn DOS.

## 2013-12-17 NOTE — Telephone Encounter (Signed)
[facility] [place], [place] - [address] Is requesting re-fill on pregabalin (LYRICA) 75 MG capsule

## 2013-12-18 ENCOUNTER — Telehealth: Payer: Self-pay

## 2013-12-18 NOTE — Telephone Encounter (Signed)
Pt called to see Dr Burnice Logan and there are no appts available. Pt said to let Dr. Raliegh Ip know that it is him calling and he was sure that Dr Raliegh Ip would squeeze him in

## 2013-12-18 NOTE — Telephone Encounter (Signed)
Spoke to pt told him will need to contact Dr. Melton Alar who is prescribing Lyrica for refill. Pt verbalized understanding.

## 2013-12-18 NOTE — Telephone Encounter (Signed)
Please offer pt appointment today or tomorrow with another provider per Dr. Raliegh Ip he is booked.

## 2013-12-22 ENCOUNTER — Encounter: Payer: Self-pay | Admitting: Internal Medicine

## 2013-12-22 ENCOUNTER — Ambulatory Visit (INDEPENDENT_AMBULATORY_CARE_PROVIDER_SITE_OTHER): Payer: 59 | Admitting: Internal Medicine

## 2013-12-22 VITALS — BP 110/70 | HR 65 | Temp 98.3°F | Resp 20 | Ht 69.0 in | Wt 187.0 lb

## 2013-12-22 DIAGNOSIS — G43909 Migraine, unspecified, not intractable, without status migrainosus: Secondary | ICD-10-CM

## 2013-12-22 DIAGNOSIS — E7800 Pure hypercholesterolemia, unspecified: Secondary | ICD-10-CM

## 2013-12-22 DIAGNOSIS — M1710 Unilateral primary osteoarthritis, unspecified knee: Secondary | ICD-10-CM

## 2013-12-22 DIAGNOSIS — IMO0002 Reserved for concepts with insufficient information to code with codable children: Secondary | ICD-10-CM

## 2013-12-22 DIAGNOSIS — M179 Osteoarthritis of knee, unspecified: Secondary | ICD-10-CM

## 2013-12-22 DIAGNOSIS — M1990 Unspecified osteoarthritis, unspecified site: Secondary | ICD-10-CM

## 2013-12-22 NOTE — Progress Notes (Signed)
Subjective:    Patient ID: [name] [name], male    DOB: June 14, 1951, 63 y.o.   MRN: [id number]  HPI  63 year old patient who has a history of severe generalized osteoarthritis.  He is status post right total the replacement surgery on 08/25/2013.  He has made some nice progress.  Postoperatively, has been difficult on bilateral shoulder disease.  His shoulder pain has worsened considerably.  He is scheduled for staged shoulder replacement surgery.  Left shoulder later this week, and right shoulder surgery planned for December.  He has a history of dyslipidemia, which has been controlled on pravastatin.  He has cervical osteoarthritis and migraine headaches.  These have been helped nicely with Imitrex , Was made last week.  Due to a sense of fullness in the right ear.  He was concerned that it early, ear ache, but the symptoms have resolved.  He does require fullness for disability benefits to be completed.  Past Medical History  Diagnosis Date  . ALLERGIC RHINITIS 04/22/2007  . HYPERCHOLESTEROLEMIA 04/22/2007  . HYPERLIPIDEMIA 07/29/2007  . NEPHROLITHIASIS, HX OF 07/29/2007  . Osteoarth NOS-Unspec 04/22/2007  . Headache(784.0)     migraines  . DDD (degenerative disc disease), cervical   . Shoulder pain, acute     bilateral  . History of kidney stones   . History of skin cancer   . GERD (gastroesophageal reflux disease)     infrequently  . History of TMJ disorder   . Complication of anesthesia     more concerned about positioning of head & neck because of TMJ    History   Social History  . Marital Status: Married    Spouse Name: N/A    Number of Children: N/A  . Years of Education: N/A   Occupational History  . Not on file.   Social History Main Topics  . Smoking status: Never Smoker   . Smokeless tobacco: Never Used  . Alcohol Use: No  . Drug Use: No  . Sexual Activity: Not on file   Other Topics Concern  . Not on file   Social History Narrative  . No narrative on file      Past Surgical History  Procedure Laterality Date  . Appendectomy    . Knee arthroscopy      right x2  . Shoulder surgery      right  . Total knee arthroplasty Right 08/25/2013    Procedure: RIGHT TOTAL KNEE ARTHROPLASTY;  Surgeon: Gearlean Alf, MD;  Location: [facility];  Service: Orthopedics;  Laterality: Right;  . Joint replacement      Family History  Problem Relation Age of Onset  . Cancer Neg Hx     family hx -breast and lung Ca  . COPD Neg Hx     family hx    Allergies  Allergen Reactions  . Diphenhydramine Hcl Anaphylaxis and Rash    Only if it has dyes in it  . Shrimp [Shellfish Allergy] Anaphylaxis and Rash  . Rivaroxaban Rash  . Amoxicillin Other (See Comments)    Unknown   . Erythromycin Other (See Comments)    unknown  . Betadine [Povidone Iodine] Rash  . Celebrex [Celecoxib] Rash  . Codeine Rash and Other (See Comments)    Blacked out  . Meperidine And Related Rash  . Methocarbamol Rash  . Penicillins Rash  . Plavix [Clopidogrel] Rash  . Vioxx [Rofecoxib] Rash    Current Outpatient Prescriptions on File Prior to Visit  Medication Sig Dispense Refill  . allopurinol (ZYLOPRIM) 100 MG tablet Take 100 mg by mouth every morning.      Marland Kitchen allopurinol (ZYLOPRIM) 100 MG tablet TAKE ONE TABLET BY MOUTH ONCE DAILY.  90 tablet  1  . aspirin 81 MG tablet Take 81 mg by mouth daily.      . Cholecalciferol (VITAMIN D PO) Take 1 tablet by mouth daily.      . cyclobenzaprine (FLEXERIL) 10 MG tablet Take 10 mg by mouth 3 (three) times daily as needed for muscle spasms (Prescribed by Dr. Wynelle Link).      Marland Kitchen diazepam (VALIUM) 5 MG tablet Take 5 mg by mouth as needed for sedation (Prescribed by Dr. Onnie Graham).      Marland Kitchen divalproex (DEPAKOTE) 250 MG DR tablet Take 250 mg by mouth 2 (two) times daily. Prescribed by Dr. Melton Alar      . HYDROmorphone (DILAUDID) 2 MG tablet Take 2-4 mg by mouth every 4 (four) hours as needed for severe pain (Prescribed by Dr. Wynelle Link).      . Multiple  Vitamin (MULTIVITAMIN) tablet Take 1 tablet by mouth daily.      . Omega-3 Fatty Acids (FISH OIL) 1000 MG CAPS Take 1 capsule by mouth daily.      . pravastatin (PRAVACHOL) 40 MG tablet Take 40 mg by mouth every evening.      . pregabalin (LYRICA) 75 MG capsule Take 75-150 mg by mouth 3 (three) times daily. 150mg  in the morning and 75mg  in the evening. Prescribed by Dr. Melton Alar      . Psyllium (METAMUCIL PO) Take 2 tablets by mouth 2 (two) times daily.      . Red Yeast Rice Extract (RED YEAST RICE PO) Take 1 tablet by mouth daily.      . SUMAtriptan (IMITREX) 100 MG tablet TAKE ONE TABLET BY MOUTH EVERY 2 HOURS AS NEEDED FOR MIGRANE OR HEADACHE, MAY REPEAT IN 2 HOURS IF HEADACHE PERSISTS OR RECURS  10 tablet  2  . traMADol (ULTRAM) 50 MG tablet Take 50-100 mg by mouth every 6 (six) hours as needed (mild pain Prescribed by Dr. Wynelle Link).      . TURMERIC CURCUMIN PO Take 1,050 mg by mouth daily.      . valACYclovir (VALTREX) 500 MG tablet Take 500 mg by mouth daily.      . vitamin C (ASCORBIC ACID) 500 MG tablet Take 500 mg by mouth daily.       No current facility-administered medications on file prior to visit.    BP 110/70  Pulse 65  Temp(Src) 98.3 F (36.8 C) (Oral)  Resp 20  Ht 5\' 9"  (1.753 m)  Wt 187 lb (84.823 kg)  BMI 27.60 kg/m2  SpO2 98%       Review of Systems  Constitutional: Negative for fever, chills, appetite change and fatigue.  HENT: Negative for congestion, dental problem, ear pain, hearing loss, sore throat, tinnitus, trouble swallowing and voice change.   Eyes: Negative for pain, discharge and visual disturbance.  Respiratory: Negative for cough, chest tightness, wheezing and stridor.   Cardiovascular: Negative for chest pain, palpitations and leg swelling.  Gastrointestinal: Negative for nausea, vomiting, abdominal pain, diarrhea, constipation, blood in stool and abdominal distention.  Genitourinary: Negative for urgency, hematuria, flank pain, discharge,  difficulty urinating and genital sores.  Musculoskeletal: Positive for arthralgias, back pain, gait problem, joint swelling and neck pain. Negative for myalgias and neck stiffness.  Skin: Negative for rash.  Neurological: Positive for headaches. Negative  for dizziness, syncope, speech difficulty, weakness and numbness.  Hematological: Negative for adenopathy. Does not bruise/bleed easily.  Psychiatric/Behavioral: Negative for behavioral problems and dysphoric mood. The patient is not nervous/anxious.        Objective:   Physical Exam  Constitutional: He appears well-developed and well-nourished. No distress.  Blood pressure low normal  HENT:  Right Ear: External ear normal.  Left Ear: External ear normal.  Both tympanic membranes normal  Musculoskeletal:  Status post right total knee replacement surgery with nicely healed incision Knee slightly warm to touch  Bilateral pain and decreased range of motion involving both shoulders          Assessment & Plan:   Generalized advanced osteoarthritis Status post right total knee replacement surgery, 08/25/2013 End-stage osteoarthritis, bilateral shoulders Migraine headaches, stable Dyslipidemia

## 2013-12-22 NOTE — Progress Notes (Signed)
Pre-visit discussion using our clinic review tool. No additional management support is needed unless otherwise documented below in the visit note.  

## 2013-12-22 NOTE — Patient Instructions (Signed)
CPX in 6 months

## 2013-12-24 MED ORDER — CHLORHEXIDINE GLUCONATE 4 % EX LIQD
60.0000 mL | Freq: Once | CUTANEOUS | Status: DC
  Filled 2013-12-24: qty 60

## 2013-12-24 MED ORDER — CLINDAMYCIN PHOSPHATE 900 MG/50ML IV SOLN
900.0000 mg | INTRAVENOUS | Status: AC
  Administered 2013-12-25: 900 mg via INTRAVENOUS
  Filled 2013-12-24: qty 50

## 2013-12-24 MED ORDER — LACTATED RINGERS IV SOLN: INTRAVENOUS | Status: DC

## 2013-12-25 ENCOUNTER — Encounter (HOSPITAL_COMMUNITY): Payer: 59 | Admitting: Anesthesiology

## 2013-12-25 ENCOUNTER — Ambulatory Visit (HOSPITAL_COMMUNITY): Payer: 59 | Admitting: Anesthesiology

## 2013-12-25 ENCOUNTER — Encounter (HOSPITAL_COMMUNITY): Payer: Self-pay | Admitting: *Deleted

## 2013-12-25 ENCOUNTER — Encounter (HOSPITAL_COMMUNITY)
Admission: RE | Disposition: A | Discharge status: Home / Self Care | Payer: Self-pay | Source: Ambulatory Visit | Attending: Orthopedic Surgery

## 2013-12-25 ENCOUNTER — Inpatient Hospital Stay (HOSPITAL_COMMUNITY)
Admission: RE | Admit: 2013-12-25 | Discharge: 2013-12-26 | DRG: 483 | Disposition: A | Discharge status: Home / Self Care | Payer: 59 | Source: Ambulatory Visit | Attending: Orthopedic Surgery | Admitting: Orthopedic Surgery

## 2013-12-25 DIAGNOSIS — Z96659 Presence of unspecified artificial knee joint: Secondary | ICD-10-CM

## 2013-12-25 DIAGNOSIS — E7800 Pure hypercholesterolemia, unspecified: Secondary | ICD-10-CM | POA: Diagnosis present

## 2013-12-25 DIAGNOSIS — E785 Hyperlipidemia, unspecified: Secondary | ICD-10-CM | POA: Diagnosis present

## 2013-12-25 DIAGNOSIS — Z91013 Allergy to seafood: Secondary | ICD-10-CM

## 2013-12-25 DIAGNOSIS — Z96619 Presence of unspecified artificial shoulder joint: Secondary | ICD-10-CM

## 2013-12-25 DIAGNOSIS — M19019 Primary osteoarthritis, unspecified shoulder: Principal | ICD-10-CM | POA: Diagnosis present

## 2013-12-25 DIAGNOSIS — Z888 Allergy status to other drugs, medicaments and biological substances status: Secondary | ICD-10-CM

## 2013-12-25 DIAGNOSIS — Z85828 Personal history of other malignant neoplasm of skin: Secondary | ICD-10-CM

## 2013-12-25 DIAGNOSIS — Z885 Allergy status to narcotic agent status: Secondary | ICD-10-CM

## 2013-12-25 DIAGNOSIS — Z880 Allergy status to penicillin: Secondary | ICD-10-CM

## 2013-12-25 DIAGNOSIS — K219 Gastro-esophageal reflux disease without esophagitis: Secondary | ICD-10-CM | POA: Diagnosis present

## 2013-12-25 DIAGNOSIS — M26609 Unspecified temporomandibular joint disorder, unspecified side: Secondary | ICD-10-CM | POA: Diagnosis present

## 2013-12-25 DIAGNOSIS — Z881 Allergy status to other antibiotic agents status: Secondary | ICD-10-CM

## 2013-12-25 HISTORY — PX: TOTAL SHOULDER ARTHROPLASTY: SHX126

## 2013-12-25 LAB — TYPE AND SCREEN
ABO/RH(D): A POS
ANTIBODY SCREEN: NEGATIVE

## 2013-12-25 LAB — ABO/RH: ABO/RH(D): A POS

## 2013-12-25 SURGERY — ARTHROPLASTY, SHOULDER, TOTAL
Anesthesia: General | Site: Shoulder | Laterality: Left

## 2013-12-25 MED ORDER — PREGABALIN 75 MG PO CAPS: 75.0000 mg | ORAL_CAPSULE | Freq: Three times a day (TID) | ORAL | Status: DC

## 2013-12-25 MED ORDER — ONDANSETRON HCL 4 MG/2ML IJ SOLN: 4.0000 mg | Freq: Once | INTRAMUSCULAR | Status: DC | PRN

## 2013-12-25 MED ORDER — HYDROMORPHONE HCL 2 MG PO TABS
2.0000 mg | ORAL_TABLET | ORAL | Status: DC | PRN
  Administered 2013-12-25 – 2013-12-26 (×7): 2 mg via ORAL
  Filled 2013-12-25 (×6): qty 1

## 2013-12-25 MED ORDER — PROPOFOL 10 MG/ML IV BOLUS
INTRAVENOUS | Status: AC
  Filled 2013-12-25: qty 20

## 2013-12-25 MED ORDER — ASPIRIN 81 MG PO CHEW
81.0000 mg | CHEWABLE_TABLET | Freq: Every day | ORAL | Status: DC
  Administered 2013-12-25 – 2013-12-26 (×2): 81 mg via ORAL
  Filled 2013-12-25 (×2): qty 1

## 2013-12-25 MED ORDER — FENTANYL CITRATE 0.05 MG/ML IJ SOLN
INTRAMUSCULAR | Status: DC | PRN
  Administered 2013-12-25: 100 ug via INTRAVENOUS
  Administered 2013-12-25: 50 ug via INTRAVENOUS
  Administered 2013-12-25: 100 ug via INTRAVENOUS

## 2013-12-25 MED ORDER — SUMATRIPTAN SUCCINATE 100 MG PO TABS
100.0000 mg | ORAL_TABLET | ORAL | Status: DC | PRN
  Administered 2013-12-26: 100 mg via ORAL
  Filled 2013-12-25: qty 1

## 2013-12-25 MED ORDER — HYDROMORPHONE HCL PF 1 MG/ML IJ SOLN
0.2500 mg | INTRAMUSCULAR | Status: DC | PRN
  Administered 2013-12-26: 1 mg via INTRAVENOUS
  Filled 2013-12-25: qty 1

## 2013-12-25 MED ORDER — EPHEDRINE SULFATE 50 MG/ML IJ SOLN
INTRAMUSCULAR | Status: AC
  Filled 2013-12-25: qty 1

## 2013-12-25 MED ORDER — VALACYCLOVIR HCL 500 MG PO TABS
500.0000 mg | ORAL_TABLET | Freq: Every day | ORAL | Status: DC
  Administered 2013-12-25 – 2013-12-26 (×2): 500 mg via ORAL
  Filled 2013-12-25 (×2): qty 1

## 2013-12-25 MED ORDER — PREGABALIN 50 MG PO CAPS
150.0000 mg | ORAL_CAPSULE | Freq: Every day | ORAL | Status: DC
  Administered 2013-12-25 – 2013-12-26 (×2): 150 mg via ORAL
  Filled 2013-12-25: qty 3

## 2013-12-25 MED ORDER — PREGABALIN 50 MG PO CAPS
75.0000 mg | ORAL_CAPSULE | Freq: Every day | ORAL | Status: DC
  Administered 2013-12-25: 75 mg via ORAL
  Filled 2013-12-25 (×4): qty 1

## 2013-12-25 MED ORDER — ACETAMINOPHEN 325 MG PO TABS
650.0000 mg | ORAL_TABLET | Freq: Four times a day (QID) | ORAL | Status: DC | PRN
  Administered 2013-12-25: 650 mg via ORAL
  Filled 2013-12-25: qty 2

## 2013-12-25 MED ORDER — PROPOFOL 10 MG/ML IV BOLUS
INTRAVENOUS | Status: DC | PRN
  Administered 2013-12-25: 200 mg via INTRAVENOUS

## 2013-12-25 MED ORDER — BISACODYL 5 MG PO TBEC: 5.0000 mg | DELAYED_RELEASE_TABLET | Freq: Every day | ORAL | Status: DC | PRN

## 2013-12-25 MED ORDER — GLYCOPYRROLATE 0.2 MG/ML IJ SOLN
INTRAMUSCULAR | Status: AC
  Filled 2013-12-25: qty 2

## 2013-12-25 MED ORDER — SUMATRIPTAN SUCCINATE 100 MG PO TABS
100.0000 mg | ORAL_TABLET | Freq: Once | ORAL | Status: AC
  Administered 2013-12-25: 100 mg via ORAL
  Filled 2013-12-25: qty 1

## 2013-12-25 MED ORDER — PHENOL 1.4 % MT LIQD: 1.0000 | OROMUCOSAL | Status: DC | PRN

## 2013-12-25 MED ORDER — HYDROMORPHONE HCL PF 1 MG/ML IJ SOLN
INTRAMUSCULAR | Status: AC
  Filled 2013-12-25: qty 1

## 2013-12-25 MED ORDER — SUCCINYLCHOLINE CHLORIDE 20 MG/ML IJ SOLN
INTRAMUSCULAR | Status: AC
  Filled 2013-12-25: qty 1

## 2013-12-25 MED ORDER — METOCLOPRAMIDE HCL 10 MG PO TABS: 5.0000 mg | ORAL_TABLET | Freq: Three times a day (TID) | ORAL | Status: DC | PRN

## 2013-12-25 MED ORDER — HYDROMORPHONE HCL PF 1 MG/ML IJ SOLN
0.5000 mg | Freq: Once | INTRAMUSCULAR | Status: AC
  Administered 2013-12-25: 0.5 mg via INTRAVENOUS

## 2013-12-25 MED ORDER — NEOSTIGMINE METHYLSULFATE 1 MG/ML IJ SOLN
INTRAMUSCULAR | Status: AC
  Filled 2013-12-25: qty 10

## 2013-12-25 MED ORDER — SODIUM CHLORIDE 0.9 % IR SOLN
Status: DC | PRN
  Administered 2013-12-25: 1000 mL

## 2013-12-25 MED ORDER — CLINDAMYCIN PHOSPHATE 600 MG/50ML IV SOLN
600.0000 mg | Freq: Four times a day (QID) | INTRAVENOUS | Status: AC
  Administered 2013-12-25 – 2013-12-26 (×3): 600 mg via INTRAVENOUS
  Filled 2013-12-25 (×3): qty 50

## 2013-12-25 MED ORDER — HYDROMORPHONE HCL PF 1 MG/ML IJ SOLN
0.2500 mg | INTRAMUSCULAR | Status: DC | PRN
  Administered 2013-12-25 (×4): 0.5 mg via INTRAVENOUS

## 2013-12-25 MED ORDER — LIDOCAINE HCL (CARDIAC) 20 MG/ML IV SOLN
INTRAVENOUS | Status: AC
  Filled 2013-12-25: qty 5

## 2013-12-25 MED ORDER — ALUM & MAG HYDROXIDE-SIMETH 200-200-20 MG/5ML PO SUSP: 30.0000 mL | ORAL | Status: DC | PRN

## 2013-12-25 MED ORDER — ROCURONIUM BROMIDE 100 MG/10ML IV SOLN
INTRAVENOUS | Status: DC | PRN
  Administered 2013-12-25: 50 mg via INTRAVENOUS

## 2013-12-25 MED ORDER — FLEET ENEMA 7-19 GM/118ML RE ENEM: 1.0000 | ENEMA | Freq: Once | RECTAL | Status: AC | PRN

## 2013-12-25 MED ORDER — NEOSTIGMINE METHYLSULFATE 1 MG/ML IJ SOLN
INTRAMUSCULAR | Status: DC | PRN
  Administered 2013-12-25: 3 mg via INTRAVENOUS

## 2013-12-25 MED ORDER — DOCUSATE SODIUM 100 MG PO CAPS
100.0000 mg | ORAL_CAPSULE | Freq: Two times a day (BID) | ORAL | Status: DC
  Administered 2013-12-25 – 2013-12-26 (×3): 100 mg via ORAL
  Filled 2013-12-25 (×4): qty 1

## 2013-12-25 MED ORDER — MENTHOL 3 MG MT LOZG: 1.0000 | LOZENGE | OROMUCOSAL | Status: DC | PRN

## 2013-12-25 MED ORDER — ACETAMINOPHEN 650 MG RE SUPP: 650.0000 mg | Freq: Four times a day (QID) | RECTAL | Status: DC | PRN

## 2013-12-25 MED ORDER — LACTATED RINGERS IV SOLN
INTRAVENOUS | Status: DC | PRN
  Administered 2013-12-25 (×3): via INTRAVENOUS

## 2013-12-25 MED ORDER — PHENYLEPHRINE 40 MCG/ML (10ML) SYRINGE FOR IV PUSH (FOR BLOOD PRESSURE SUPPORT)
PREFILLED_SYRINGE | INTRAVENOUS | Status: AC
  Filled 2013-12-25: qty 10

## 2013-12-25 MED ORDER — ASPIRIN 81 MG PO TABS: 81.0000 mg | ORAL_TABLET | Freq: Every day | ORAL | Status: DC

## 2013-12-25 MED ORDER — ONDANSETRON HCL 4 MG/2ML IJ SOLN
INTRAMUSCULAR | Status: DC | PRN
  Administered 2013-12-25: 4 mg via INTRAVENOUS

## 2013-12-25 MED ORDER — DIAZEPAM 5 MG PO TABS
ORAL_TABLET | ORAL | Status: AC
  Filled 2013-12-25: qty 1

## 2013-12-25 MED ORDER — ACETAMINOPHEN 10 MG/ML IV SOLN
1000.0000 mg | Freq: Once | INTRAVENOUS | Status: AC
  Administered 2013-12-26: 1000 mg via INTRAVENOUS
  Filled 2013-12-25: qty 100

## 2013-12-25 MED ORDER — PHENYLEPHRINE HCL 10 MG/ML IJ SOLN
INTRAMUSCULAR | Status: DC | PRN
  Administered 2013-12-25 (×2): 80 ug via INTRAVENOUS

## 2013-12-25 MED ORDER — SUMATRIPTAN SUCCINATE 100 MG PO TABS
100.0000 mg | ORAL_TABLET | Freq: Once | ORAL | Status: AC
  Administered 2013-12-25: 100 mg via ORAL
  Filled 2013-12-25 (×2): qty 1

## 2013-12-25 MED ORDER — LACTATED RINGERS IV SOLN
INTRAVENOUS | Status: DC
Start: 2013-12-25 — End: 2013-12-26

## 2013-12-25 MED ORDER — POLYETHYLENE GLYCOL 3350 17 G PO PACK
17.0000 g | PACK | Freq: Every day | ORAL | Status: DC | PRN
  Filled 2013-12-25: qty 1

## 2013-12-25 MED ORDER — MIDAZOLAM HCL 2 MG/2ML IJ SOLN
INTRAMUSCULAR | Status: AC
  Filled 2013-12-25: qty 2

## 2013-12-25 MED ORDER — ALLOPURINOL 100 MG PO TABS
100.0000 mg | ORAL_TABLET | Freq: Every day | ORAL | Status: DC
  Administered 2013-12-25 – 2013-12-26 (×2): 100 mg via ORAL
  Filled 2013-12-25 (×2): qty 1

## 2013-12-25 MED ORDER — LIDOCAINE HCL (CARDIAC) 20 MG/ML IV SOLN
INTRAVENOUS | Status: DC | PRN
  Administered 2013-12-25: 50 mg via INTRAVENOUS

## 2013-12-25 MED ORDER — ROCURONIUM BROMIDE 50 MG/5ML IV SOLN
INTRAVENOUS | Status: AC
  Filled 2013-12-25: qty 1

## 2013-12-25 MED ORDER — SODIUM CHLORIDE 0.9 % IJ SOLN
INTRAMUSCULAR | Status: AC
  Filled 2013-12-25: qty 10

## 2013-12-25 MED ORDER — METOCLOPRAMIDE HCL 5 MG/ML IJ SOLN: 5.0000 mg | Freq: Three times a day (TID) | INTRAMUSCULAR | Status: DC | PRN

## 2013-12-25 MED ORDER — TRAMADOL HCL 50 MG PO TABS
50.0000 mg | ORAL_TABLET | Freq: Four times a day (QID) | ORAL | Status: DC
  Administered 2013-12-25 – 2013-12-26 (×5): 50 mg via ORAL
  Filled 2013-12-25 (×5): qty 1

## 2013-12-25 MED ORDER — FENTANYL CITRATE 0.05 MG/ML IJ SOLN
INTRAMUSCULAR | Status: AC
  Filled 2013-12-25: qty 5

## 2013-12-25 MED ORDER — MIDAZOLAM HCL 5 MG/5ML IJ SOLN
INTRAMUSCULAR | Status: DC | PRN
  Administered 2013-12-25 (×2): 1 mg via INTRAVENOUS

## 2013-12-25 MED ORDER — GLYCOPYRROLATE 0.2 MG/ML IJ SOLN
INTRAMUSCULAR | Status: DC | PRN
  Administered 2013-12-25: 0.4 mg via INTRAVENOUS

## 2013-12-25 MED ORDER — ONDANSETRON HCL 4 MG PO TABS: 4.0000 mg | ORAL_TABLET | Freq: Four times a day (QID) | ORAL | Status: DC | PRN

## 2013-12-25 MED ORDER — DIAZEPAM 5 MG PO TABS
5.0000 mg | ORAL_TABLET | Freq: Four times a day (QID) | ORAL | Status: DC | PRN
  Administered 2013-12-25 – 2013-12-26 (×3): 5 mg via ORAL
  Filled 2013-12-25 (×2): qty 1

## 2013-12-25 MED ORDER — LIDOCAINE HCL 4 % MT SOLN
OROMUCOSAL | Status: DC | PRN
  Administered 2013-12-25: 4 mL via TOPICAL

## 2013-12-25 MED ORDER — ONDANSETRON HCL 4 MG/2ML IJ SOLN: 4.0000 mg | Freq: Four times a day (QID) | INTRAMUSCULAR | Status: DC | PRN

## 2013-12-25 MED ORDER — SIMVASTATIN 20 MG PO TABS
20.0000 mg | ORAL_TABLET | Freq: Every day | ORAL | Status: DC
  Administered 2013-12-25: 20 mg via ORAL
  Filled 2013-12-25 (×2): qty 1

## 2013-12-25 MED ORDER — HYDROMORPHONE HCL PF 1 MG/ML IJ SOLN
0.5000 mg | Freq: Once | INTRAMUSCULAR | Status: AC
Start: 2013-12-25 — End: 2013-12-25
  Administered 2013-12-25: 0.5 mg via INTRAVENOUS

## 2013-12-25 MED ORDER — EPHEDRINE SULFATE 50 MG/ML IJ SOLN
INTRAMUSCULAR | Status: DC | PRN
  Administered 2013-12-25: 5 mg via INTRAVENOUS
  Administered 2013-12-25 (×2): 10 mg via INTRAVENOUS

## 2013-12-25 SURGICAL SUPPLY — 63 items
BLADE SAW SGTL 83.5X18.5 (BLADE) ×2 IMPLANT
CEMENT BONE DEPUY (Cement) ×2 IMPLANT
CLSR STERI-STRIP ANTIMIC 1/2X4 (GAUZE/BANDAGES/DRESSINGS) ×2 IMPLANT
COVER SURGICAL LIGHT HANDLE (MISCELLANEOUS) ×2 IMPLANT
DRAPE INCISE IOBAN 66X45 STRL (DRAPES) ×2 IMPLANT
DRAPE SURG 17X11 SM STRL (DRAPES) ×2 IMPLANT
DRAPE SURG 17X23 STRL (DRAPES) ×2 IMPLANT
DRAPE U-SHAPE 47X51 STRL (DRAPES) ×2 IMPLANT
DRILL BIT 7/64X5 (BIT) IMPLANT
DRSG AQUACEL AG ADV 3.5X10 (GAUZE/BANDAGES/DRESSINGS) ×2 IMPLANT
DRSG MEPILEX BORDER 4X4 (GAUZE/BANDAGES/DRESSINGS) ×2 IMPLANT
DRSG MEPILEX BORDER 4X8 (GAUZE/BANDAGES/DRESSINGS) ×2 IMPLANT
DURAPREP 26ML APPLICATOR (WOUND CARE) ×4 IMPLANT
ELECT BLADE 4.0 EZ CLEAN MEGAD (MISCELLANEOUS) ×2
ELECT CAUTERY BLADE 6.4 (BLADE) ×2 IMPLANT
ELECT REM PT RETURN 9FT ADLT (ELECTROSURGICAL) ×2
ELECTRODE BLDE 4.0 EZ CLN MEGD (MISCELLANEOUS) ×1 IMPLANT
ELECTRODE REM PT RTRN 9FT ADLT (ELECTROSURGICAL) ×1 IMPLANT
FACESHIELD WRAPAROUND (MASK) ×6 IMPLANT
GLENOID ANCHOR PEG CROSSLK 48 (Orthopedic Implant) ×2 IMPLANT
GLOVE BIO SURGEON STRL SZ7.5 (GLOVE) ×2 IMPLANT
GLOVE BIO SURGEON STRL SZ8 (GLOVE) ×2 IMPLANT
GLOVE EUDERMIC 7 POWDERFREE (GLOVE) ×2 IMPLANT
GLOVE SS BIOGEL STRL SZ 7.5 (GLOVE) ×1 IMPLANT
GLOVE SUPERSENSE BIOGEL SZ 7.5 (GLOVE) ×1
GOWN STRL REUS W/ TWL LRG LVL3 (GOWN DISPOSABLE) ×1 IMPLANT
GOWN STRL REUS W/ TWL XL LVL3 (GOWN DISPOSABLE) ×2 IMPLANT
GOWN STRL REUS W/TWL LRG LVL3 (GOWN DISPOSABLE) ×1
GOWN STRL REUS W/TWL XL LVL3 (GOWN DISPOSABLE) ×2
HUMERAL HEAD 52MMX21MM (Trauma) ×2 IMPLANT
HUMERAL STEM 10MM (Trauma) ×2 IMPLANT
KIT BASIN OR (CUSTOM PROCEDURE TRAY) ×2 IMPLANT
KIT ROOM TURNOVER OR (KITS) ×2 IMPLANT
MANIFOLD NEPTUNE II (INSTRUMENTS) ×2 IMPLANT
NDL SUT 6 .5 CRC .975X.05 MAYO (NEEDLE) ×1 IMPLANT
NEEDLE HYPO 25GX1X1/2 BEV (NEEDLE) IMPLANT
NEEDLE MAYO TAPER (NEEDLE) ×1
NS IRRIG 1000ML POUR BTL (IV SOLUTION) ×2 IMPLANT
PACK SHOULDER (CUSTOM PROCEDURE TRAY) ×2 IMPLANT
PAD ARMBOARD 7.5X6 YLW CONV (MISCELLANEOUS) ×4 IMPLANT
PASSER SUT SWANSON 36MM LOOP (INSTRUMENTS) IMPLANT
PIN METAGLENE 2.5 (PIN) ×2 IMPLANT
SLING ARM LRG ADULT FOAM STRAP (SOFTGOODS) IMPLANT
SLING ARM XL FOAM STRAP (SOFTGOODS) ×2 IMPLANT
SMARTMIX MINI TOWER (MISCELLANEOUS) ×2
SPONGE LAP 18X18 X RAY DECT (DISPOSABLE) ×2 IMPLANT
SPONGE LAP 4X18 X RAY DECT (DISPOSABLE) ×2 IMPLANT
STEM HUMERAL 10MM (Trauma) ×1 IMPLANT
STRIP CLOSURE SKIN 1/2X4 (GAUZE/BANDAGES/DRESSINGS) ×2 IMPLANT
SUCTION FRAZIER TIP 10 FR DISP (SUCTIONS) ×2 IMPLANT
SUT BONE WAX W31G (SUTURE) IMPLANT
SUT FIBERWIRE #2 38 T-5 BLUE (SUTURE) ×4
SUT MNCRL AB 3-0 PS2 18 (SUTURE) ×2 IMPLANT
SUT VIC AB 1 CT1 27 (SUTURE) ×3
SUT VIC AB 1 CT1 27XBRD ANBCTR (SUTURE) ×3 IMPLANT
SUT VIC AB 2-0 CT1 27 (SUTURE) ×2
SUT VIC AB 2-0 CT1 TAPERPNT 27 (SUTURE) ×2 IMPLANT
SUTURE FIBERWR #2 38 T-5 BLUE (SUTURE) ×2 IMPLANT
SYR CONTROL 10ML LL (SYRINGE) IMPLANT
TOWEL OR 17X24 6PK STRL BLUE (TOWEL DISPOSABLE) ×2 IMPLANT
TOWEL OR 17X26 10 PK STRL BLUE (TOWEL DISPOSABLE) ×2 IMPLANT
TOWER SMARTMIX MINI (MISCELLANEOUS) ×1 IMPLANT
WATER STERILE IRR 1000ML POUR (IV SOLUTION) ×2 IMPLANT

## 2013-12-25 NOTE — H&P (Signed)
[name] [name]    Chief Complaint: LEFT SHOULDER OA HPI: The patient is a 63 y.o. male with end stage left shoulder OA  Past Medical History  Diagnosis Date  . ALLERGIC RHINITIS 04/22/2007  . HYPERCHOLESTEROLEMIA 04/22/2007  . HYPERLIPIDEMIA 07/29/2007  . NEPHROLITHIASIS, HX OF 07/29/2007  . Osteoarth NOS-Unspec 04/22/2007  . Headache(784.0)     migraines  . DDD (degenerative disc disease), cervical   . Shoulder pain, acute     bilateral  . History of kidney stones   . History of skin cancer   . GERD (gastroesophageal reflux disease)     infrequently  . History of TMJ disorder   . Complication of anesthesia     more concerned about positioning of head & neck because of TMJ    Past Surgical History  Procedure Laterality Date  . Appendectomy    . Knee arthroscopy      right x2  . Shoulder surgery      right  . Total knee arthroplasty Right 08/25/2013    Procedure: RIGHT TOTAL KNEE ARTHROPLASTY;  Surgeon: [name] Alf, MD;  Location: [facility];  Service: Orthopedics;  Laterality: Right;  . Joint replacement      Family History  Problem Relation Age of Onset  . Cancer Neg Hx     family hx -breast and lung Ca  . COPD Neg Hx     family hx    Social History:  reports that he has never smoked. He has never used smokeless tobacco. He reports that he does not drink alcohol or use illicit drugs.  Allergies:  Allergies  Allergen Reactions  . Diphenhydramine Hcl Anaphylaxis and Rash    Only if it has dyes in it  . Shrimp [Shellfish Allergy] Anaphylaxis and Rash  . Rivaroxaban Rash  . Amoxicillin Other (See Comments)    Unknown   . Erythromycin Other (See Comments)    unknown  . Betadine [Povidone Iodine] Rash  . Celebrex [Celecoxib] Rash  . Codeine Rash and Other (See Comments)    Blacked out  . Meperidine And Related Rash  . Methocarbamol Rash  . Penicillins Rash  . Plavix [Clopidogrel] Rash  . Vioxx [Rofecoxib] Rash    Medications Prior to Admission  Medication  Sig Dispense Refill  . allopurinol (ZYLOPRIM) 100 MG tablet Take 100 mg by mouth every morning.      Marland Kitchen aspirin 81 MG tablet Take 81 mg by mouth daily.      . Cholecalciferol (VITAMIN D PO) Take 1 tablet by mouth daily.      . cyclobenzaprine (FLEXERIL) 10 MG tablet Take 10 mg by mouth 3 (three) times daily as needed for muscle spasms (Prescribed by Dr. Wynelle [name]).      Marland Kitchen diazepam (VALIUM) 5 MG tablet Take 5 mg by mouth as needed for sedation (Prescribed by Dr. Onnie [name]).      Marland Kitchen divalproex (DEPAKOTE) 250 MG DR tablet Take 250 mg by mouth 2 (two) times daily. Prescribed by Dr. Melton [name]      . HYDROmorphone (DILAUDID) 2 MG tablet Take 2-4 mg by mouth every 4 (four) hours as needed for severe pain (Prescribed by Dr. Wynelle [name]).      . Multiple Vitamin (MULTIVITAMIN) tablet Take 1 tablet by mouth daily.      . Omega-3 Fatty Acids (FISH OIL) 1000 MG CAPS Take 1 capsule by mouth daily.      . pravastatin (PRAVACHOL) 40 MG tablet Take 40 mg by mouth every evening.      Marland Kitchen  pregabalin (LYRICA) 75 MG capsule Take 75-150 mg by mouth 3 (three) times daily. 150mg  in the morning and 75mg  in the evening. Prescribed by Dr. Melton [name]      . Psyllium (METAMUCIL PO) Take 2 tablets by mouth 2 (two) times daily.      . Red Yeast Rice Extract (RED YEAST RICE PO) Take 1 tablet by mouth daily.      . SUMAtriptan (IMITREX) 100 MG tablet TAKE ONE TABLET BY MOUTH EVERY 2 HOURS AS NEEDED FOR MIGRANE OR HEADACHE, MAY REPEAT IN 2 HOURS IF HEADACHE PERSISTS OR RECURS  10 tablet  2  . traMADol (ULTRAM) 50 MG tablet Take 50-100 mg by mouth every 6 (six) hours as needed (mild pain Prescribed by Dr. Wynelle [name]).      . TURMERIC CURCUMIN PO Take 1,050 mg by mouth daily.      . valACYclovir (VALTREX) 500 MG tablet Take 500 mg by mouth daily.      . vitamin C (ASCORBIC ACID) 500 MG tablet Take 500 mg by mouth daily.      Marland Kitchen allopurinol (ZYLOPRIM) 100 MG tablet TAKE ONE TABLET BY MOUTH ONCE DAILY.  90 tablet  1     Physical Exam: left shoulder with  painful and restricted motion as noted at recent office visits  Vitals  Temp:  [98.6 F (37 C)] 98.6 F (37 C) (04/02 0552) Pulse Rate:  [65] 65 (04/02 0552) Resp:  [20] 20 (04/02 0552) BP: (149)/(66) 149/66 mmHg (04/02 0552) SpO2:  [99 %] 99 % (04/02 0552) Weight:  [84.823 kg (187 lb)] 84.823 kg (187 lb) (04/02 0552)  Assessment/Plan  Impression: LEFT SHOULDER OA  Plan of Action: Procedure(s): LEFT TOTAL SHOULDER ARTHROPLASTY  [name] [name] M 12/25/2013, 7:28 AM

## 2013-12-25 NOTE — Transfer of Care (Signed)
Immediate Anesthesia Transfer of Care Note  Patient: [name] [name]  Procedure(s) Performed: Procedure(s): LEFT TOTAL SHOULDER ARTHROPLASTY (Left)  Patient Location: PACU  Anesthesia Type:General and GA combined with regional for post-op pain  Level of Consciousness: sedated, patient cooperative and responds to stimulation  Airway & Oxygen Therapy: Patient Spontanous Breathing and Patient connected to nasal cannula oxygen  Post-op Assessment: Report given to PACU RN, Post -op Vital signs reviewed and stable and Patient moving all extremities  Post vital signs: Reviewed and stable  Complications: No apparent anesthesia complications

## 2013-12-25 NOTE — Discharge Instructions (Signed)
° °  Kevin M. Supple, M.D., F.A.A.O.S. °Orthopaedic Surgery °Specializing in Arthroscopic and Reconstructive °Surgery of the Shoulder and Knee °[phone] °[address] - [place], [place] [address] - Fax [phone] ° ° °POST-OP TOTAL SHOULDER REPLACEMENT/SHOULDER HEMIARTHROPLASTY INSTRUCTIONS ° °1. Call the office at [phone] to schedule your first post-op appointment 10-14 days from the date of your surgery. ° °2. The bandage over your incision is waterproof. You may begin showering with this dressing on. You may leave this dressing on until first follow up appointment within 2 weeks. If you would like to remove it you may do so after the 5th day. Go slow and tug at the borders gently to break the bond the dressing has with the skin. The steri strips may come off with the dressing. At this point if there is no drainage it is okay to go without a bandage or you may cover it with a light guaze and tape. Leave the steri-strips in place over your incision. You can expect drainage that is bloody or yellow in nature that should gradually decrease from day of surgery. Change your dressing daily until drainage is completely resolved, then you may feel free to go without a bandage. You can also expect significant bruising around your shoulder that will drift down your arm and into your chest wall. This is very normal and should resolve over several days. ° ° 3. Wear your sling/immobilizer at all times except to perform the exercises below or to occasionally let your arm dangle by your side to stretch your elbow. You also need to sleep in your sling immobilizer until instructed otherwise. ° °4. Range of motion to your elbow, wrist, and hand are encouraged 3-5 times daily. Exercise to your hand and fingers helps to reduce swelling you may experience. ° °5. Utilize ice to the shoulder 3-5 times minimum a day and additionally if you are experiencing pain. ° °6. Prescriptions for a pain medication and a muscle  relaxant are provided for you. It is recommended that if you are experiencing pain that you pain medication alone is not controlling, add the muscle relaxant along with the pain medication which can give additional pain relief. The first 1-2 days is generally the most severe of your pain and then should gradually decrease. As your pain lessens it is recommended that you decrease your use of the pain medications to an "as needed basis'" only and to always comply with the recommended dosages of the pain medications. ° °7. Pain medications can produce constipation along with their use. If you experience this, the use of an over the counter stool softener or laxative daily is recommended.  ° °8. For most patients, if insurance allows, home health services to include therapy has been arranged. ° °9. For additional questions or concerns, please do not hesitate to call the office. If after hours there is an answering service to forward your concerns to the physician on call. ° °POST-OP EXERCISES ° °Pendulum Exercises ° °Perform pendulum exercises while standing and bending at the waist. Support your uninvolved arm on a table or chair and allow your operated arm to hang freely. Make sure to do these exercises passively - not using you shoulder muscles. ° °Repeat 20 times. Do 3 sessions per day. ° ° ° ° °

## 2013-12-25 NOTE — Op Note (Signed)
12/25/2013  9:39 AM  PATIENT:   [name] [name]  63 y.o. male  PRE-OPERATIVE DIAGNOSIS:  LEFT SHOULDER OA  POST-OPERATIVE DIAGNOSIS:  same  PROCEDURE:  L TSA, #10 stem, 52x21 eccentric head, 48 glenoid  SURGEON:  Kensley Lares, Metta Clines M.D.  ASSISTANTS: Shuford pac  ANESTHESIA:   GET + ISB  EBL: 200  SPECIMEN:  none  Drains: none   PATIENT DISPOSITION:  PACU - hemodynamically stable.    PLAN OF CARE: Admit to inpatient   Dictation# [id number]

## 2013-12-25 NOTE — Telephone Encounter (Signed)
Pt had an appt on 12/22/13

## 2013-12-25 NOTE — Anesthesia Procedure Notes (Signed)
Anesthesia Regional Block:  Interscalene brachial plexus block  Pre-Anesthetic Checklist: ,, timeout performed, Correct Patient, Correct Site, Correct Laterality, Correct Procedure, Correct Position, site marked, Risks and benefits discussed,  Surgical consent,  Pre-op evaluation,  At surgeon's request and post-op pain management  Laterality: Left  Prep: chloraprep and alcohol swabs       Needles:  Injection technique: Single-shot  Needle Type: Stimulator Needle - 40          Additional Needles:  Procedures: nerve stimulator Interscalene brachial plexus block  Nerve Stimulator or Paresthesia:  Response: 0.5 mA, 0.1 ms, 4 cm  Additional Responses:   Narrative:  Start time: 12/25/2013 7:00 AM End time: 12/25/2013 7:06 AM Injection made incrementally with aspirations every 5 mL.  Performed by: Personally  Anesthesiologist: Sharolyn Douglas MD  Additional Notes: Pt accepts procedure w/ risks. 15cc 0.5% Marcaine w/ epi w/o difficulty or discomfort. GES

## 2013-12-25 NOTE — Op Note (Signed)
NAME:  HAILE, BOSLER NO.:  [id number]  MEDICAL RECORD NO.:  [id number]  LOCATION:  [facility]                         FACILITY:  [facility]  PHYSICIAN:  Metta Clines. Avrie Kedzierski, M.D.  DATE OF BIRTH:  1951-01-25  DATE OF PROCEDURE:  12/25/2013 DATE OF DISCHARGE:                              OPERATIVE REPORT   PREOPERATIVE DIAGNOSIS:  End-stage left shoulder osteoarthritis.  POSTOPERATIVE DIAGNOSIS:  End-stage left shoulder osteoarthritis.  PROCEDURE:  Left total shoulder arthroplasty utilizing a press-fit size 10 [employer] stem with a 52 x 21 eccentric head and a cemented pegged 48 glenoid.  SURGEON:  Metta Clines. Uva Runkel, M.D.  Terrence DupontOlivia Mackie A. Shuford, P.A.-C.  ANESTHESIA:  General endotracheal as well as interscalene block.  ESTIMATED BLOOD LOSS:  200 mL.  DRAINS:  None.  HISTORY:  Mr. Rarick is a 63 year old gentleman who has had chronic bilateral shoulder pain related to end-stage glenohumeral arthritis.  He has been considering a shoulder replacement for several years now and intubated as twitch shoulder, proceed with since they are both problematic, but at this time, has elected to proceed with left total shoulder arthroplasty as described below.  Preoperatively, I counseled Mr. Kimbrell regarding treatment options and risks versus benefits thereof.  Possible surgical complications were all reviewed including potential for bleeding, infection, neurovascular injury, persistent pain, loss of motion, anesthetic complication, failure of the implant, and possible need for additional surgery.  He understands and accepts and agrees with our planned procedure.  DESCRIPTION OF PROCEDURE:  After undergoing routine preop evaluation, the patient received prophylactic antibiotics and an interscalene block was established in the holding area by the Anesthesia Department. Placed supine on the operating table, underwent smooth induction of a general endotracheal anesthesia.   Placed in a beach-chair position and appropriate padding protected.  Left shoulder girdle region was sterilely prepped and draped in standard fashion.  Time-out was called. In an anterior approach, the left shoulder was made through the deltopectoral interval, total length approximately 12 cm in length. Skin flaps were elevated and electrocautery was used for hemostasis. Cephalic vein was identified and retracted laterally with the deltoid. The pec major retracted medially, and the upper cm and half was tenotomized to enhance exposure.  We then divided adhesions beneath the deltoid gaining exposure deeply and then the conjoined tendon was identified, mobilized, retracted medially.  At this point, I unroofed the bicipital tendon from the bicipital groove, tenotomized for later tenodesis.  The rotator cuff was then split from the apex of the bicipital groove to the base of the coracoid and then the subscapularis was divided from the lesser tuberosity leaving 1.5 cm cuff tissue for later repair and the free margin of the subscap was intact with a series of #2 FiberWire sutures.  Then divided the capsule from the anteroinferior and inferior aspects of the humeral head allowing delivery humeral to the wound and carefully protected the rotator cuff superiorly and posteriorly.  The humeral head showed marked eburnation with complete loss of articular cartilage.  Using an extramedullary guide, we outlined our proposed humeral head resection, was then performed with an oscillating saw at approximately 30 degrees of retroversion, mimicking in native retroversion.  We then used the hand reamers to ream the humeral medullary canal up to size 10, which was indeed quite tight fit, but we did achieve appropriate reaming.  I then used the appropriate cookie cutter guide to broach for our stem and then broached up to size 10.  At this point, we used a combination of rongeur and osteotomes to remove the  prominent and osteophytes on the anterior and inferior aspects of the humeral head.  A metal cap was placed at the cut surface of the proximal humerus.  We then gained exposure to the glenoid with a combination of Fukuda, pitchfork, and snake tongue retractors.  I performed a circumferential labral resection and divided the capsule anteriorly to mobilize the subscapularis.  Care was taken to protect the rotator cuff, and once we had gained complete exposure, I placed a guide pin into the center of the glenoid after we determined that the 48 had the best fit.  I reamed the glenoid to a smooth subchondral bony bed and then placed a central drill hole, followed by the peripheral peg holes, irrigated meticulously, cleaned the glenoid and the trial of 48 glenoid showed excellent fit.  At this point, cement was mixed at the appropriate consistency, introduced cement into the peripheral peg holes and the final glenoid was impacted into position and the cement was allowed harden.  At this point, we then returned our attention to the humerus where the canal was irrigated.  We took size 10 stem and this into the canal and used bone graft, harvested from the resected humeral head to bone graft metaphyseal segment of the stem. The stem was then terminally seated with excellent fit and fixation.  I then performed trial reductions and ultimately found that the 52 x 21 eccentric head had the best coverage at best soft tissue balance, allowing 50% translation of the humeral head over the glenoid.  The final 52 x 21 eccentric head was impacted.  Final reduction was performed that we irrigated the joint.  At this point, the subscapularis was repaired back to the lesser tuberosity using #2 FiberWire.  The rotator interval was closed with a pair of figure-of-eight #2 FiberWire sutures and reinforced this repair with an additional FiberWire and then the biceps was tenodesed at the upper margins of the  pectoralis with #2 FiberWire.  The wound was then terminally irrigated.  Hemostasis was obtained.  The deltopectoral interval was then reapproximated with a series of #1 Vicryl sutures, 2-0 Vicryl used for the subcu layer and intracuticular 3-0 Monocryl for the skin, and followed by Steri-Strips. Dry dressings were applied.  Left arm was then placed into a sling, and the patient was awakened, extubated, and taken to recovery room in stable condition.  Tracy A. Shuford, P.A.-C. was used as an Environmental consultant throughout this case and essential for help with positioning of the patient, positioning of the extremity, management of the retractors, tissue manipulation, wound closure, and intraoperative decision making.     Metta Clines. Ronit Cranfield, M.D.     KMS/MEDQ  D:  12/25/2013  T:  12/25/2013  Job:  [id number]

## 2013-12-25 NOTE — Progress Notes (Signed)
Utilization review completed.  

## 2013-12-25 NOTE — Anesthesia Preprocedure Evaluation (Signed)
Anesthesia Evaluation  Patient identified by MRN, date of birth, ID band Patient awake    Reviewed: Allergy & Precautions, H&P , NPO status , Patient's Chart, lab work & pertinent test results  Airway       Dental   Pulmonary          Cardiovascular     Neuro/Psych  Headaches,    GI/Hepatic GERD-  ,  Endo/Other    Renal/GU Renal disease     Musculoskeletal   Abdominal   Peds  Hematology   Anesthesia Other Findings   Reproductive/Obstetrics                           Anesthesia Physical Anesthesia Plan  ASA: I  Anesthesia Plan: General   Post-op Pain Management:    Induction: Intravenous  Airway Management Planned: Oral ETT  Additional Equipment:   Intra-op Plan:   Post-operative Plan: Extubation in OR  Informed Consent: I have reviewed the patients History and Physical, chart, labs and discussed the procedure including the risks, benefits and alternatives for the proposed anesthesia with the patient or authorized representative who has indicated his/her understanding and acceptance.     Plan Discussed with:   Anesthesia Plan Comments:         Anesthesia Quick Evaluation

## 2013-12-26 MED ORDER — HYDROMORPHONE HCL 2 MG PO TABS
2.0000 mg | ORAL_TABLET | ORAL | Status: DC | PRN
Start: 2013-12-26 — End: 2014-06-09

## 2013-12-26 MED ORDER — DIAZEPAM 5 MG PO TABS: 2.5000 mg | ORAL_TABLET | Freq: Four times a day (QID) | ORAL | Status: DC | PRN

## 2013-12-26 MED ORDER — TRAMADOL HCL 50 MG PO TABS
50.0000 mg | ORAL_TABLET | Freq: Four times a day (QID) | ORAL | Status: DC | PRN
Start: 2013-12-26 — End: 2014-06-09

## 2013-12-26 NOTE — Progress Notes (Signed)
Pt has been having unrelieved pain rated at eight or higher despite pain meds. PA [place] notified. IV tylenol ordered. Pt was able to rest well for approximately 2 hours but began having severe pain when he got up to the BR. IV Dilaudid given in .25mg  increments and pt received 1 mg over 10 minutes. He fell asleep but his sats dropped to 84%. Oxygen increased to three liters and pt had to be awakened to take deep breaths. Thirty minutes after dilaudid was given, pt was sleeping, sats remained at 94 to 97%, respirations were 14, and pulse was 52 to 60.

## 2013-12-26 NOTE — Progress Notes (Signed)
Occupational Therapy Evaluation Patient Details Name: [name] [name] MRN: [id number] DOB: Feb 13, 1951 Today's Date: 12/26/2013    History of Present Illness Pt 63 y.o male s/p L TSA    Clinical Impression   PTA pt lived at home with wife and was independent in ADLs and mobility. Pt presents with limitations in RUE FF (45 degrees, no horizontal adduction) and s/p L TSA. Pt performed grooming and dressing activities while maintaining TSA precautions to prepare for discharge home. Education and training provided regarding shoulder precautions and UB dressing as well as sling management and wear schedule. Pt requested time for pain medication to work before [name] exercises are completed. OT will follow-up with pt prior to d/c today for education on [name] exercises and TSA protocol.      Follow Up Recommendations  Supervision/Assistance - 24 hour    Equipment Recommendations  None recommended by OT       Precautions / Restrictions Precautions Precautions: Shoulder Type of Shoulder Precautions: Supple Protocol Shoulder Interventions: Shoulder sling/immobilizer;For comfort Precaution Booklet Issued: Yes (comment) Precaution Comments: Educated pt and wife on incorporating precautions into ADLs and [name] exercises. Required Braces or Orthoses: Sling Restrictions Weight Bearing Restrictions: Yes LUE Weight Bearing: Non weight bearing      Mobility                   Transfers Overall transfer level: Needs assistance Equipment used: None Transfers: Sit to/from Stand Sit to Stand: Min guard (due to dizziness from medications; improved over session)              Balance Overall balance assessment: Modified Independent (pt with slow ambulation and some dizziness related to medication, however WFL)                                          ADL Overall ADL's : Needs assistance/impaired Eating/Feeding: Set up;Sitting   Grooming: Set up;Sitting   Upper Body  Bathing: Sitting;Minimal assitance (due to RUE limitations and L TSA)   Lower Body Bathing: Min guard;Sit to/from stand   Upper Body Dressing : Moderate assistance;Sitting (due to RUE limitations and L TSA)   Lower Body Dressing: Min guard;Sit to/from stand   Toilet Transfer: Min guard;Ambulation;Comfort height toilet;Grab bars   Toileting- Clothing Manipulation and Hygiene: Min guard;Sit to/from stand   Tub/ Shower Transfer: Min guard;Ambulation   Functional mobility during ADLs: Min guard (for dizziness related to medications; improved over session) General ADL Comments: Pt performed grooming and dressing to prepare for discharge while maintaining TSA precautions with verbal cues.                Pertinent Vitals/Pain Pt reports 9/10 pain in L shoulder. RN has administered pain medication about 5 minutes prior to OT session. Repositioned and applied ice to decrease pain.     Hand Dominance Right   Extremity/Trunk Assessment Upper Extremity Assessment Upper Extremity Assessment: RUE deficits/detail;LUE deficits/detail RUE Deficits / Details: Impaired [name] due to OA; FF to 45 degrees, pt unable to horizontally adduct LUE Deficits / Details: TSA supple protocol (no AROM)   Lower Extremity Assessment Lower Extremity Assessment: Overall WFL for tasks assessed   Cervical / Trunk Assessment Cervical / Trunk Assessment: Normal   Communication Communication Communication: No difficulties   Cognition Arousal/Alertness: Awake/alert Behavior During Therapy: Anxious Overall Cognitive Status: Within Functional Limits for tasks assessed  Home Living Family/patient expects to be discharged to:: Private residence Living Arrangements: Spouse/significant other Available Help at Discharge: Family;Available 24 hours/day Type of Home: House Home Access: Stairs to enter [employer] of Steps: 2-5 (2 in garage and 4-5 in front and  front has one rail) Entrance Stairs-Rails: None Home Layout: Two level;Able to live on main level with bedroom/bathroom     Bathroom Shower/Tub: Occupational psychologist: Standard     Home Equipment: Walker - 2 wheels          Prior Functioning/Environment Level of Independence: Independent             OT Diagnosis: Generalized weakness;Acute pain   OT Problem List: Decreased range of motion;Decreased activity tolerance;Impaired balance (sitting and/or standing);Decreased strength;Decreased safety awareness;Decreased knowledge of precautions;Pain;Impaired UE functional use;Increased edema   OT Treatment/Interventions: Self-care/ADL training;Therapeutic exercise;Patient/family education    OT Goals(Current goals can be found in the care plan section) Acute Rehab OT Goals Patient Stated Goal: To get back to exercising and using arm OT Goal Formulation: With patient Time For Goal Achievement: 01/02/14 Potential to Achieve Goals: Good  OT Frequency: Other (comment) (One additional acute OT session prior to d/c today)    End of Session Equipment Utilized During Treatment: Other (comment);Gait belt (sling)  Activity Tolerance: Patient tolerated treatment well Patient left: in chair;with call bell/phone within reach;with family/visitor present   Time: 0925-1025 OT Time Calculation (min): 60 min Charges:  OT General Charges $OT Visit: 1 Procedure OT Evaluation $Initial OT Evaluation Tier I: 1 Procedure OT Treatments $Self Care/Home Management : 53-67 mins  Juluis Rainier [phone] 12/26/2013, 3:48 PM

## 2013-12-26 NOTE — Progress Notes (Signed)
Occupational Therapy Treatment and Discharge Patient Details Name: [name] [name] MRN: [id number] DOB: Sep 25, 1951 Today's Date: 12/26/2013    History of present illness Pt 63 y.o male s/p L TSA    OT comments  Pt seen for LUE [name] exercises and precaution education this date to prepare pt for d/c home. Pt and wife educated and completed PROM of L shoulder (FF, AB, ER) and AROM of elbow, wrist, and hand. Pt also educated and completed pendulum exercises. Reviewed precautions and sling management with pt. Due to pt's neck pain and R shoulder OA, OT adapted sling using velco strap around waist and soft padding looping directly over R shoulder to hold sling in place. Pt reports that this placed less pressure on neck and shoulder and was appreciative. Pt practiced donning/doffing shirt and sling to increase independence prior to discharge. Pt reports that his wife will be home 24/7 to assist with ADLs as needed for several days. No further acute OT needs. Acute OT to sign off.    Follow Up Recommendations  Supervision/Assistance - 24 hour    Equipment Recommendations  None recommended by OT       Precautions / Restrictions Precautions Precautions: Shoulder Type of Shoulder Precautions: Supple Protocol Shoulder Interventions: Shoulder sling/immobilizer;For comfort Precaution Booklet Issued: Yes (comment) Precaution Comments: Educated pt and wife on incorporating precautions into ADLs and [name] exercises. Required Braces or Orthoses: Sling Restrictions Weight Bearing Restrictions: Yes LUE Weight Bearing: Non weight bearing       Mobility Bed Mobility                  Transfers Overall transfer level: Needs assistance Equipment used: None Transfers: Sit to/from Stand Sit to Stand: Supervision               Balance Overall balance assessment: Modified Independent (pt with slow ambulation and some dizziness related to medication, however WFL)                                  ADL Overall ADL's : Needs assistance/impaired Eating/Feeding: Set up;Sitting   Grooming: Set up;Sitting    Upper Body Dressing : Min assistance;Sitting (due to RUE limitations and L TSA)- pt improved with practice       Functional mobility during ADLs: Mod I (for dizziness related to medications; improved over session) General ADL Comments: Pt practiced UB dressing and sling management prior to discharge to increase independence.       Pain/Vitals: Pt reports 7/10 pain with relief from pain and migraine medications; applied Ice and repositioned at end of session.         Cognition  Arousal/Alertness: Awake/Alert Behavior During Therapy: Anxious Overall Cognitive Status: Within Functional Limits for tasks assessed                         Exercises Shoulder Exercises Pendulum Exercise: PROM;Left;10 reps;Standing Shoulder Flexion: PROM;10 reps;Supine;Left;Other (comment) (with wife return demo to ~30 degrees) Shoulder ABduction: PROM;Left;10 reps;Supine;Other (comment) (with wife return demo to ~45 degrees) Shoulder External Rotation: PROM;Left;10 reps;Supine;Other (comment) (with wife return demo to neutral) Elbow Flexion: AROM;Left;10 reps;Supine Elbow Extension: AROM;Left;10 reps;Supine Wrist Flexion: AROM;Left;10 reps;Seated Wrist Extension: AROM;Left;10 reps;Seated Digit Composite Flexion: AROM;Left;10 reps;Seated Composite Extension: AROM;Left;10 reps;Seated Donning/doffing shirt without moving shoulder: Minimal assistance (due to RUE limitations; improved from this morning with prac) Method for sponge bathing under operated UE: Supervision/safety Donning/doffing sling/immobilizer:  Minimal assistance Correct positioning of sling/immobilizer: Supervision/safety Pendulum exercises (written home exercise program): Supervision/safety [name] for elbow, wrist and digits of operated UE: Supervision/safety Sling wearing schedule (on at all times/off for  ADL's): Supervision/safety Proper positioning of operated UE when showering: Supervision/safety Positioning of UE while sleeping: Supervision/safety   Shoulder Instructions Shoulder Instructions Donning/doffing shirt without moving shoulder: Minimal assistance (due to RUE limitations; improved from this morning with prac) Method for sponge bathing under operated UE: Supervision/safety Donning/doffing sling/immobilizer: Minimal assistance Correct positioning of sling/immobilizer: Supervision/safety Pendulum exercises (written home exercise program): Supervision/safety [name] for elbow, wrist and digits of operated UE: Supervision/safety Sling wearing schedule (on at all times/off for ADL's): Supervision/safety Proper positioning of operated UE when showering: Supervision/safety Positioning of UE while sleeping: Supervision/safety              Prior Functioning/Environment Level of Independence: Independent                  Plan  Pt has met OT goals and is ready for discharge from OT.       End of Session Equipment Utilized During Treatment: Other (comment);Gait belt (sling)   Activity Tolerance Patient tolerated treatment well   Patient Left in chair;with call bell/phone within reach;with family/visitor present   Nurse Communication Other (comment) (pt ready for d/c from OT standpoint)        Time: 2395-3202 OT Time Calculation (min): 65 min  Charges: OT General Charges $OT Visit: 1 Procedure OT Treatments $Therapeutic Exercise: 53-67 mins  Juluis Rainier [phone] 12/26/2013, 4:04 PM

## 2013-12-26 NOTE — Discharge Summary (Signed)
PATIENT ID:      [name] [name]  MRN:     [id number] DOB/AGE:    [birth year] / 63 y.o.     DISCHARGE SUMMARY  ADMISSION DATE:    12/25/2013 DISCHARGE DATE:    ADMISSION DIAGNOSIS: LEFT SHOULDER OA Past Medical History  Diagnosis Date  . ALLERGIC RHINITIS 04/22/2007  . HYPERCHOLESTEROLEMIA 04/22/2007  . HYPERLIPIDEMIA 07/29/2007  . NEPHROLITHIASIS, HX OF 07/29/2007  . Osteoarth NOS-Unspec 04/22/2007  . Headache(784.0)     migraines  . DDD (degenerative disc disease), cervical   . Shoulder pain, acute     bilateral  . History of kidney stones   . History of skin cancer   . GERD (gastroesophageal reflux disease)     infrequently  . History of TMJ disorder   . Complication of anesthesia     more concerned about positioning of head & neck because of TMJ    DISCHARGE DIAGNOSIS:   Active Problems:   S/P shoulder replacement   PROCEDURE: Procedure(s): LEFT TOTAL SHOULDER ARTHROPLASTY on 12/25/2013  CONSULTS:   none  HISTORY:  See H&P in chart.  HOSPITAL COURSE:  [name] [name] is a 63 y.o. admitted on 12/25/2013 with a chief complaint of left greater than right shoulder pain, and found to have a diagnosis of LEFT SHOULDER OA.  They were brought to the operating room on 12/25/2013 and underwent Procedure(s): LEFT TOTAL SHOULDER ARTHROPLASTY.    They were given perioperative antibiotics: Anti-infectives   Start     Dose/Rate Route Frequency Ordered Stop   12/25/13 1400  clindamycin (CLEOCIN) IVPB 600 mg     600 mg 100 mL/hr over 30 Minutes Intravenous Every 6 hours 12/25/13 1141 12/26/13 0230   12/25/13 1300  valACYclovir (VALTREX) tablet 500 mg     500 mg Oral Daily 12/25/13 1141     12/25/13 0600  clindamycin (CLEOCIN) IVPB 900 mg     900 mg 100 mL/hr over 30 Minutes Intravenous On call to O.R. 12/24/13 1404 12/25/13 0815    .  Patient underwent the above named procedure and tolerated it well. The following day they were hemodynamically stable and pain was controlled on oral  analgesics. They were neurovascularly intact to the operative extremity. OT was ordered and worked with patient per protocol. They were medically and orthopaedically stable for discharge on day 1  .    DIAGNOSTIC STUDIES:  RECENT RADIOGRAPHIC STUDIES :  No results found.  RECENT VITAL SIGNS:  Patient Vitals for the past 24 hrs:  BP Temp Temp src Pulse Resp SpO2 Height Weight  12/26/13 0600 95/47 mmHg 97.5 F (36.4 C) - 62 14 99 % - -  12/26/13 0559 - - - - - - 5\' 9"  (1.753 m) 79.833 kg (176 lb)  12/26/13 0425 - - - 54 14 97 % - -  12/26/13 0251 - - - 54 14 96 % - -  12/26/13 0241 - - - 56 14 97 % - -  12/26/13 0236 - - - 54 14 96 % - -  12/26/13 0226 - - - - 16 97 % - -  12/26/13 0224 - - - 56 12 97 % - -  12/26/13 0219 91/47 mmHg 97.9 F (36.6 C) - 60 18 90 % - -  12/26/13 0153 - - - - 14 96 % - -  12/26/13 0027 - - - 63 16 94 % - -  12/25/13 2316 - - - - - 97 % - -  12/25/13 2300 - - - - - 89 % - -  12/25/13 2111 96/54 mmHg 97.9 F (36.6 C) - 61 18 98 % - -  12/25/13 1152 132/75 mmHg 97.5 F (36.4 C) Axillary 69 18 98 % - -  12/25/13 1133 - 97.4 F (36.3 C) - - - - - -  12/25/13 1125 113/80 mmHg - - 68 21 98 % - -  12/25/13 1115 - - - 73 21 100 % - -  12/25/13 1110 119/71 mmHg - - 58 13 100 % - -  12/25/13 1100 - - - 68 21 96 % - -  12/25/13 1055 116/84 mmHg - - 60 12 99 % - -  12/25/13 1045 - - - 74 19 99 % - -  12/25/13 1040 133/76 mmHg - - 71 16 99 % - -  12/25/13 1030 - - - 69 13 97 % - -  12/25/13 1025 145/94 mmHg - - 72 14 96 % - -  .  RECENT EKG RESULTS:    Orders placed in visit on 07/22/13  . EKG 12-LEAD    DISCHARGE INSTRUCTIONS:    DISCHARGE MEDICATIONS:     Medication List         allopurinol 100 MG tablet  Commonly known as:  ZYLOPRIM  TAKE ONE TABLET BY MOUTH ONCE DAILY.     allopurinol 100 MG tablet  Commonly known as:  ZYLOPRIM  Take 100 mg by mouth every morning.     aspirin 81 MG tablet  Take 81 mg by mouth daily.      cyclobenzaprine 10 MG tablet  Commonly known as:  FLEXERIL  Take 10 mg by mouth 3 (three) times daily as needed for muscle spasms (Prescribed by Dr. Wynelle Link).     diazepam 5 MG tablet  Commonly known as:  VALIUM  Take 5 mg by mouth as needed for sedation (Prescribed by Dr. Onnie Graham).     diazepam 5 MG tablet  Commonly known as:  VALIUM  Take 0.5-1 tablets (2.5-5 mg total) by mouth every 6 (six) hours as needed for muscle spasms or sedation.     divalproex 250 MG DR tablet  Commonly known as:  DEPAKOTE  Take 250 mg by mouth 2 (two) times daily. Prescribed by Dr. Melton Alar     Fish Oil 1000 MG Caps  Take 1 capsule by mouth daily.     HYDROmorphone 2 MG tablet  Commonly known as:  DILAUDID  Take 2-4 mg by mouth every 4 (four) hours as needed for severe pain (Prescribed by Dr. Wynelle Link).     HYDROmorphone 2 MG tablet  Commonly known as:  DILAUDID  Take 1-2 tablets (2-4 mg total) by mouth every 4 (four) hours as needed.     METAMUCIL PO  Take 2 tablets by mouth 2 (two) times daily.     multivitamin tablet  Take 1 tablet by mouth daily.     pravastatin 40 MG tablet  Commonly known as:  PRAVACHOL  Take 40 mg by mouth every evening.     pregabalin 75 MG capsule  Commonly known as:  LYRICA  Take 75-150 mg by mouth 2 (two) times daily. Pt takes 150mg  in a.m. and 75mg  in p.m.     RED YEAST RICE PO  Take 1 tablet by mouth daily.     SUMAtriptan 100 MG tablet  Commonly known as:  IMITREX  TAKE ONE TABLET BY MOUTH EVERY 2 HOURS AS NEEDED FOR MIGRANE OR HEADACHE, MAY  REPEAT IN 2 HOURS IF HEADACHE PERSISTS OR RECURS     traMADol 50 MG tablet  Commonly known as:  ULTRAM  Take 50-100 mg by mouth every 6 (six) hours as needed (mild pain Prescribed by Dr. Wynelle Link).     traMADol 50 MG tablet  Commonly known as:  ULTRAM  Take 1 tablet (50 mg total) by mouth every 6 (six) hours as needed for moderate pain.     TURMERIC CURCUMIN PO  Take 1,050 mg by mouth daily.     valACYclovir 500 MG  tablet  Commonly known as:  VALTREX  Take 500 mg by mouth daily.     vitamin C 500 MG tablet  Commonly known as:  ASCORBIC ACID  Take 500 mg by mouth daily.     VITAMIN D PO  Take 1 tablet by mouth daily.        FOLLOW UP VISIT:       Follow-up Information   Follow up with Marin Shutter, MD. (call to be seen in 10-14 days)    Specialty:  Orthopedic Surgery   Contact information:   [address] [place] [address] [phone]       DISCHARGE TO: Home   DISPOSITION: Good  DISCHARGE CONDITION:  Festus Barren for Dr. Justice Britain 12/26/2013, 10:18 AM

## 2013-12-28 NOTE — Progress Notes (Signed)
Occupational Therapy Note  Received page from [facility] operator, who connected me to pt wife.  Wife voices concern and uncertainty about how to perform abduction as they are unable to recall the instructions.   Pt's chart was reviewed.  Verbally instructed wife how to perform PROM Lt. Shoulder abduction to 60 degrees per OT  instructions provided them while here.  Wife able to verbalize understanding (pt supine, support elbow and wrist, keeping elbow flexed and hand on stomach).  Also instructed her to contact MD office tomorrow if she has any further questions, or call OT again if needing clarification.   Lucille Passy, OTR/L [phone]

## 2013-12-29 ENCOUNTER — Encounter (HOSPITAL_COMMUNITY): Payer: Self-pay | Admitting: Orthopedic Surgery

## 2014-01-05 NOTE — Anesthesia Postprocedure Evaluation (Signed)
  Anesthesia Post-op Note  Patient: [name] [name]  Procedure(s) Performed: Procedure(s): LEFT TOTAL SHOULDER ARTHROPLASTY (Left)  Patient Location: PACU  Anesthesia Type:General  Level of Consciousness: awake, alert , oriented and patient cooperative  Airway and Oxygen Therapy: Patient Spontanous Breathing  Post-op Pain: mild  Post-op Assessment: Post-op Vital signs reviewed, Patient's Cardiovascular Status Stable, Respiratory Function Stable, Patent Airway and No signs of Nausea or vomiting  Post-op Vital Signs: stable  Last Vitals:  Filed Vitals:   12/26/13 0600  BP: 95/47  Pulse: 62  Temp: 36.4 C  Resp: 14    Complications: No apparent anesthesia complications

## 2014-01-06 ENCOUNTER — Ambulatory Visit: Payer: Managed Care, Other (non HMO) | Attending: Orthopedic Surgery

## 2014-01-06 DIAGNOSIS — IMO0001 Reserved for inherently not codable concepts without codable children: Secondary | ICD-10-CM | POA: Insufficient documentation

## 2014-01-06 DIAGNOSIS — M25519 Pain in unspecified shoulder: Secondary | ICD-10-CM | POA: Diagnosis not present

## 2014-01-06 DIAGNOSIS — M25619 Stiffness of unspecified shoulder, not elsewhere classified: Secondary | ICD-10-CM | POA: Insufficient documentation

## 2014-01-06 DIAGNOSIS — M6281 Muscle weakness (generalized): Secondary | ICD-10-CM | POA: Insufficient documentation

## 2014-01-09 ENCOUNTER — Ambulatory Visit: Payer: Managed Care, Other (non HMO) | Admitting: Physical Therapy

## 2014-01-09 DIAGNOSIS — IMO0001 Reserved for inherently not codable concepts without codable children: Secondary | ICD-10-CM | POA: Diagnosis not present

## 2014-01-12 ENCOUNTER — Ambulatory Visit: Payer: Managed Care, Other (non HMO) | Admitting: Physical Therapy

## 2014-01-12 DIAGNOSIS — IMO0001 Reserved for inherently not codable concepts without codable children: Secondary | ICD-10-CM | POA: Diagnosis not present

## 2014-01-14 ENCOUNTER — Ambulatory Visit: Payer: Managed Care, Other (non HMO) | Admitting: Physical Therapy

## 2014-01-14 DIAGNOSIS — IMO0001 Reserved for inherently not codable concepts without codable children: Secondary | ICD-10-CM | POA: Diagnosis not present

## 2014-01-20 ENCOUNTER — Ambulatory Visit: Payer: Managed Care, Other (non HMO) | Admitting: Physical Therapy

## 2014-01-20 DIAGNOSIS — IMO0001 Reserved for inherently not codable concepts without codable children: Secondary | ICD-10-CM | POA: Diagnosis not present

## 2014-01-22 ENCOUNTER — Ambulatory Visit: Payer: Managed Care, Other (non HMO) | Admitting: Physical Therapy

## 2014-01-22 DIAGNOSIS — IMO0001 Reserved for inherently not codable concepts without codable children: Secondary | ICD-10-CM | POA: Diagnosis not present

## 2014-01-26 ENCOUNTER — Ambulatory Visit: Payer: 59 | Attending: Orthopedic Surgery | Admitting: Physical Therapy

## 2014-01-26 DIAGNOSIS — M25519 Pain in unspecified shoulder: Secondary | ICD-10-CM | POA: Insufficient documentation

## 2014-01-26 DIAGNOSIS — M6281 Muscle weakness (generalized): Secondary | ICD-10-CM | POA: Insufficient documentation

## 2014-01-26 DIAGNOSIS — R5381 Other malaise: Secondary | ICD-10-CM | POA: Insufficient documentation

## 2014-01-26 DIAGNOSIS — M25619 Stiffness of unspecified shoulder, not elsewhere classified: Secondary | ICD-10-CM | POA: Insufficient documentation

## 2014-01-26 DIAGNOSIS — IMO0001 Reserved for inherently not codable concepts without codable children: Secondary | ICD-10-CM | POA: Insufficient documentation

## 2014-01-28 ENCOUNTER — Ambulatory Visit: Payer: 59 | Admitting: Physical Therapy

## 2014-02-02 ENCOUNTER — Ambulatory Visit: Payer: 59 | Admitting: Physical Therapy

## 2014-02-03 ENCOUNTER — Other Ambulatory Visit: Payer: Self-pay | Admitting: Internal Medicine

## 2014-02-04 ENCOUNTER — Ambulatory Visit: Payer: 59 | Admitting: Physical Therapy

## 2014-02-05 ENCOUNTER — Other Ambulatory Visit: Payer: Self-pay | Admitting: Internal Medicine

## 2014-02-10 ENCOUNTER — Ambulatory Visit: Payer: 59 | Admitting: Physical Therapy

## 2014-02-12 ENCOUNTER — Ambulatory Visit: Payer: 59 | Admitting: Physical Therapy

## 2014-02-18 ENCOUNTER — Ambulatory Visit: Payer: 59 | Admitting: Physical Therapy

## 2014-02-20 ENCOUNTER — Ambulatory Visit: Payer: 59 | Admitting: Physical Therapy

## 2014-02-20 ENCOUNTER — Other Ambulatory Visit: Payer: Self-pay | Admitting: Internal Medicine

## 2014-02-24 ENCOUNTER — Ambulatory Visit: Payer: 59 | Attending: Orthopedic Surgery | Admitting: Physical Therapy

## 2014-02-24 DIAGNOSIS — M25619 Stiffness of unspecified shoulder, not elsewhere classified: Secondary | ICD-10-CM | POA: Insufficient documentation

## 2014-02-24 DIAGNOSIS — IMO0001 Reserved for inherently not codable concepts without codable children: Secondary | ICD-10-CM | POA: Diagnosis present

## 2014-02-24 DIAGNOSIS — R5381 Other malaise: Secondary | ICD-10-CM | POA: Diagnosis not present

## 2014-02-24 DIAGNOSIS — M6281 Muscle weakness (generalized): Secondary | ICD-10-CM | POA: Diagnosis not present

## 2014-02-24 DIAGNOSIS — M25519 Pain in unspecified shoulder: Secondary | ICD-10-CM | POA: Diagnosis not present

## 2014-02-26 ENCOUNTER — Ambulatory Visit: Payer: 59 | Admitting: Physical Therapy

## 2014-02-26 DIAGNOSIS — IMO0001 Reserved for inherently not codable concepts without codable children: Secondary | ICD-10-CM | POA: Diagnosis not present

## 2014-03-02 ENCOUNTER — Ambulatory Visit: Payer: 59 | Admitting: Physical Therapy

## 2014-03-02 DIAGNOSIS — IMO0001 Reserved for inherently not codable concepts without codable children: Secondary | ICD-10-CM | POA: Diagnosis not present

## 2014-03-05 ENCOUNTER — Encounter: Payer: 59 | Admitting: Physical Therapy

## 2014-03-10 ENCOUNTER — Ambulatory Visit: Payer: 59 | Admitting: Physical Therapy

## 2014-03-10 DIAGNOSIS — IMO0001 Reserved for inherently not codable concepts without codable children: Secondary | ICD-10-CM | POA: Diagnosis not present

## 2014-03-25 ENCOUNTER — Other Ambulatory Visit: Payer: Self-pay | Admitting: Internal Medicine

## 2014-05-07 ENCOUNTER — Other Ambulatory Visit: Payer: Self-pay | Admitting: Internal Medicine

## 2014-06-02 ENCOUNTER — Other Ambulatory Visit: Payer: Self-pay | Admitting: Internal Medicine

## 2014-06-09 ENCOUNTER — Encounter (HOSPITAL_COMMUNITY): Payer: Self-pay | Admitting: Pharmacy Technician

## 2014-06-10 ENCOUNTER — Ambulatory Visit (INDEPENDENT_AMBULATORY_CARE_PROVIDER_SITE_OTHER): Payer: Managed Care, Other (non HMO) | Admitting: *Deleted

## 2014-06-10 ENCOUNTER — Encounter (HOSPITAL_COMMUNITY)
Admission: RE | Admit: 2014-06-10 | Discharge: 2014-06-10 | Disposition: A | Discharge status: Home / Self Care | Payer: 59 | Source: Ambulatory Visit | Attending: Orthopedic Surgery | Admitting: Orthopedic Surgery

## 2014-06-10 ENCOUNTER — Encounter (HOSPITAL_COMMUNITY): Payer: Self-pay

## 2014-06-10 DIAGNOSIS — Z01812 Encounter for preprocedural laboratory examination: Secondary | ICD-10-CM | POA: Diagnosis present

## 2014-06-10 DIAGNOSIS — Z23 Encounter for immunization: Secondary | ICD-10-CM

## 2014-06-10 DIAGNOSIS — M19019 Primary osteoarthritis, unspecified shoulder: Secondary | ICD-10-CM | POA: Insufficient documentation

## 2014-06-10 HISTORY — DX: Malignant (primary) neoplasm, unspecified: C80.1

## 2014-06-10 LAB — COMPREHENSIVE METABOLIC PANEL
ALT: 32 U/L (ref 0–53)
AST: 38 U/L — AB (ref 0–37)
Albumin: 3.5 g/dL (ref 3.5–5.2)
Alkaline Phosphatase: 38 U/L — ABNORMAL LOW (ref 39–117)
Anion gap: 14 (ref 5–15)
BILIRUBIN TOTAL: 0.5 mg/dL (ref 0.3–1.2)
BUN: 26 mg/dL — ABNORMAL HIGH (ref 6–23)
CHLORIDE: 103 meq/L (ref 96–112)
CO2: 22 mEq/L (ref 19–32)
Calcium: 9.2 mg/dL (ref 8.4–10.5)
Creatinine, Ser: 1.03 mg/dL (ref 0.50–1.35)
GFR calc non Af Amer: 75 mL/min — ABNORMAL LOW (ref >90)
GFR, EST AFRICAN AMERICAN: 87 mL/min — AB (ref >90)
GLUCOSE: 89 mg/dL (ref 70–99)
Potassium: 4.9 mEq/L (ref 3.7–5.3)
Sodium: 139 mEq/L (ref 137–147)
Total Protein: 7 g/dL (ref 6.0–8.3)

## 2014-06-10 LAB — CBC WITH DIFFERENTIAL/PLATELET
BASOS ABS: 0.1 10*3/uL (ref 0.0–0.1)
Basophils Relative: 2 % — ABNORMAL HIGH (ref 0–1)
EOS PCT: 5 % (ref 0–5)
Eosinophils Absolute: 0.3 10*3/uL (ref 0.0–0.7)
HEMATOCRIT: 45.7 % (ref 39.0–52.0)
HEMOGLOBIN: 15.7 g/dL (ref 13.0–17.0)
LYMPHS PCT: 40 % (ref 12–46)
Lymphs Abs: 1.9 10*3/uL (ref 0.7–4.0)
MCH: 31.8 pg (ref 26.0–34.0)
MCHC: 34.4 g/dL (ref 30.0–36.0)
MCV: 92.7 fL (ref 78.0–100.0)
MONOS PCT: 10 % (ref 3–12)
Monocytes Absolute: 0.5 10*3/uL (ref 0.1–1.0)
NEUTROS ABS: 2 10*3/uL (ref 1.7–7.7)
Neutrophils Relative %: 43 % (ref 43–77)
Platelets: 222 10*3/uL (ref 150–400)
RBC: 4.93 MIL/uL (ref 4.22–5.81)
RDW: 14.7 % (ref 11.5–15.5)
WBC: 4.8 10*3/uL (ref 4.0–10.5)

## 2014-06-10 LAB — APTT: APTT: 26 s (ref 24–37)

## 2014-06-10 LAB — PROTIME-INR
INR: 0.98 (ref 0.00–1.49)
Prothrombin Time: 13 seconds (ref 11.6–15.2)

## 2014-06-10 NOTE — Pre-Procedure Instructions (Signed)
[name] [name]  06/10/2014   Your procedure is scheduled on: Thursday, Sept. 24, 2015  Report to [facility] Admitting at 5:30 AM.  Call this number if you have problems the morning of surgery: [phone]   Remember:   Do not eat food or drink liquids after midnight Wednesday, Sept. 23, 2015   Take these medicines the morning of surgery with A SIP OF WATER: allopurinol (ZYLOPRIM),  pregabalin (LYRICA), valACYclovir (VALTREX)  Stop taking Aspirin, vitamins, and herbal medications. (Omega-3 Fatty Acids (FISH OIL), RED YEAST RICE, TURMERIC CURCUMIN; stop 7 days prior to procedure ( Thursday, Sept. 17, 2015) Do not take any NSAIDs ie: Ibuprofen, Advil, Naproxen or any medication containing Aspirin  Do not wear jewelry, make-up or nail polish.  Do not wear lotions, powders, or perfumes. You may not wear deodorant.  Do not shave 48 hours prior to surgery. Men may shave face and neck.  Do not bring valuables to the hospital.  [facility] is not responsible for any belongings or valuables.               Contacts, dentures or bridgework may not be worn into surgery.  Leave suitcase in the car. After surgery it may be brought to your room.  For patients admitted to the hospital, discharge time is determined by your treatment team.               Patients discharged the day of surgery will not be allowed to drive home.  Name and phone number of your driver:   Special Instructions:  Special Instructions:Special Instructions: [facility] - Preparing for Surgery  Before surgery, you can play an important role.  Because skin is not sterile, your skin needs to be as free of germs as possible.  You can reduce the number of germs on you skin by washing with CHG (chlorahexidine gluconate) soap before surgery.  CHG is an antiseptic cleaner which kills germs and bonds with the skin to continue killing germs even after washing.  Please DO NOT use if you have an allergy to CHG or antibacterial  soaps.  If your skin becomes reddened/irritated stop using the CHG and inform your nurse when you arrive at Short Stay.  Do not shave (including legs and underarms) for at least 48 hours prior to the first CHG shower.  You may shave your face.  Please follow these instructions carefully:   1.  Shower with CHG Soap the night before surgery and the morning of Surgery.  2.  If you choose to wash your hair, wash your hair first as usual with your normal shampoo.  3.  After you shampoo, rinse your hair and body thoroughly to remove the Shampoo.  4.  Use CHG as you would any other liquid soap.  You can apply chg directly  to the skin and wash gently with scrungie or a clean washcloth.  5.  Apply the CHG Soap to your body ONLY FROM THE NECK DOWN.  Do not use on open wounds or open sores.  Avoid contact with your eyes, ears, mouth and genitals (private parts).  Wash genitals (private parts) with your normal soap.  6.  Wash thoroughly, paying special attention to the area where your surgery will be performed.  7.  Thoroughly rinse your body with warm water from the neck down.  8.  DO NOT shower/wash with your normal soap after using and rinsing off the CHG Soap.  9.  Pat yourself dry  with a clean towel.            10.  Wear clean pajamas.            11.  Place clean sheets on your bed the night of your first shower and do not sleep with pets.  Day of Surgery  Do not apply any lotions/deodorants the morning of surgery.  Please wear clean clothes to the hospital/surgery center.   Please read over the following fact sheets that you were given: Pain Booklet, Coughing and Deep Breathing, Blood Transfusion Information and Surgical Site Infection Prevention

## 2014-06-10 NOTE — Progress Notes (Signed)
PATIENT STATED HE WANTED TO HAVE T+S DONE AM OF SURGERY, DID NOT WISH TO WEAR BRACELET.

## 2014-06-17 MED ORDER — CLINDAMYCIN PHOSPHATE 900 MG/50ML IV SOLN
900.0000 mg | INTRAVENOUS | Status: AC
  Administered 2014-06-18: 900 mg via INTRAVENOUS
  Filled 2014-06-17: qty 50

## 2014-06-18 ENCOUNTER — Encounter (HOSPITAL_COMMUNITY): Payer: Self-pay | Admitting: *Deleted

## 2014-06-18 ENCOUNTER — Inpatient Hospital Stay (HOSPITAL_COMMUNITY)
Admission: RE | Admit: 2014-06-18 | Discharge: 2014-06-19 | DRG: 483 | Disposition: A | Discharge status: Home / Self Care | Payer: Managed Care, Other (non HMO) | Source: Ambulatory Visit | Attending: Orthopedic Surgery | Admitting: Orthopedic Surgery

## 2014-06-18 ENCOUNTER — Encounter (HOSPITAL_COMMUNITY): Payer: Managed Care, Other (non HMO) | Admitting: Anesthesiology

## 2014-06-18 ENCOUNTER — Encounter (HOSPITAL_COMMUNITY)
Admission: RE | Disposition: A | Discharge status: Home / Self Care | Payer: Self-pay | Source: Ambulatory Visit | Attending: Orthopedic Surgery

## 2014-06-18 ENCOUNTER — Ambulatory Visit (HOSPITAL_COMMUNITY): Payer: Managed Care, Other (non HMO) | Admitting: Anesthesiology

## 2014-06-18 DIAGNOSIS — M5030 Other cervical disc degeneration, unspecified cervical region: Secondary | ICD-10-CM | POA: Diagnosis present

## 2014-06-18 DIAGNOSIS — Z96619 Presence of unspecified artificial shoulder joint: Secondary | ICD-10-CM | POA: Diagnosis not present

## 2014-06-18 DIAGNOSIS — Z79899 Other long term (current) drug therapy: Secondary | ICD-10-CM

## 2014-06-18 DIAGNOSIS — Z888 Allergy status to other drugs, medicaments and biological substances status: Secondary | ICD-10-CM

## 2014-06-18 DIAGNOSIS — E7800 Pure hypercholesterolemia, unspecified: Secondary | ICD-10-CM | POA: Diagnosis present

## 2014-06-18 DIAGNOSIS — K219 Gastro-esophageal reflux disease without esophagitis: Secondary | ICD-10-CM | POA: Diagnosis present

## 2014-06-18 DIAGNOSIS — Z96659 Presence of unspecified artificial knee joint: Secondary | ICD-10-CM

## 2014-06-18 DIAGNOSIS — Z881 Allergy status to other antibiotic agents status: Secondary | ICD-10-CM

## 2014-06-18 DIAGNOSIS — Z87442 Personal history of urinary calculi: Secondary | ICD-10-CM | POA: Diagnosis not present

## 2014-06-18 DIAGNOSIS — Z801 Family history of malignant neoplasm of trachea, bronchus and lung: Secondary | ICD-10-CM | POA: Diagnosis not present

## 2014-06-18 DIAGNOSIS — Z880 Allergy status to penicillin: Secondary | ICD-10-CM | POA: Diagnosis not present

## 2014-06-18 DIAGNOSIS — Z803 Family history of malignant neoplasm of breast: Secondary | ICD-10-CM | POA: Diagnosis not present

## 2014-06-18 DIAGNOSIS — Z836 Family history of other diseases of the respiratory system: Secondary | ICD-10-CM

## 2014-06-18 DIAGNOSIS — M19019 Primary osteoarthritis, unspecified shoulder: Principal | ICD-10-CM | POA: Diagnosis present

## 2014-06-18 DIAGNOSIS — Z91013 Allergy to seafood: Secondary | ICD-10-CM

## 2014-06-18 DIAGNOSIS — Z7982 Long term (current) use of aspirin: Secondary | ICD-10-CM | POA: Diagnosis not present

## 2014-06-18 DIAGNOSIS — Z85828 Personal history of other malignant neoplasm of skin: Secondary | ICD-10-CM

## 2014-06-18 DIAGNOSIS — E785 Hyperlipidemia, unspecified: Secondary | ICD-10-CM | POA: Diagnosis present

## 2014-06-18 HISTORY — PX: TOTAL SHOULDER ARTHROPLASTY: SHX126

## 2014-06-18 LAB — TYPE AND SCREEN
ABO/RH(D): A POS
Antibody Screen: NEGATIVE

## 2014-06-18 SURGERY — ARTHROPLASTY, SHOULDER, TOTAL
Anesthesia: General | Site: Shoulder | Laterality: Right

## 2014-06-18 MED ORDER — SUMATRIPTAN SUCCINATE 100 MG PO TABS
100.0000 mg | ORAL_TABLET | ORAL | Status: AC
  Administered 2014-06-18: 100 mg via ORAL
  Filled 2014-06-18: qty 1

## 2014-06-18 MED ORDER — ONDANSETRON HCL 4 MG PO TABS: 4.0000 mg | ORAL_TABLET | Freq: Four times a day (QID) | ORAL | Status: DC | PRN

## 2014-06-18 MED ORDER — BISACODYL 5 MG PO TBEC: 5.0000 mg | DELAYED_RELEASE_TABLET | Freq: Every day | ORAL | Status: DC | PRN

## 2014-06-18 MED ORDER — SODIUM CHLORIDE 0.9 % IJ SOLN
INTRAMUSCULAR | Status: AC
  Filled 2014-06-18: qty 10

## 2014-06-18 MED ORDER — SUMATRIPTAN SUCCINATE 100 MG PO TABS
100.0000 mg | ORAL_TABLET | ORAL | Status: DC | PRN
  Administered 2014-06-19: 100 mg via ORAL
  Filled 2014-06-18 (×2): qty 1

## 2014-06-18 MED ORDER — MIDAZOLAM HCL 5 MG/5ML IJ SOLN
INTRAMUSCULAR | Status: DC | PRN
  Administered 2014-06-18: 2 mg via INTRAVENOUS

## 2014-06-18 MED ORDER — CLINDAMYCIN PHOSPHATE 600 MG/50ML IV SOLN
600.0000 mg | Freq: Four times a day (QID) | INTRAVENOUS | Status: AC
  Administered 2014-06-18 – 2014-06-19 (×3): 600 mg via INTRAVENOUS
  Filled 2014-06-18 (×3): qty 50

## 2014-06-18 MED ORDER — LACTATED RINGERS IV SOLN
INTRAVENOUS | Status: DC | PRN
  Administered 2014-06-18 (×2): via INTRAVENOUS

## 2014-06-18 MED ORDER — NEOSTIGMINE METHYLSULFATE 10 MG/10ML IV SOLN
INTRAVENOUS | Status: AC
  Filled 2014-06-18: qty 1

## 2014-06-18 MED ORDER — METOCLOPRAMIDE HCL 10 MG PO TABS: 5.0000 mg | ORAL_TABLET | Freq: Three times a day (TID) | ORAL | Status: DC | PRN

## 2014-06-18 MED ORDER — DIAZEPAM 5 MG PO TABS
ORAL_TABLET | ORAL | Status: AC
  Administered 2014-06-18: 5 mg via ORAL
  Filled 2014-06-18: qty 1

## 2014-06-18 MED ORDER — HYDROMORPHONE HCL 2 MG PO TABS: 2.0000 mg | ORAL_TABLET | ORAL | Status: DC | PRN

## 2014-06-18 MED ORDER — HYDROMORPHONE HCL 2 MG PO TABS
2.0000 mg | ORAL_TABLET | ORAL | Status: DC | PRN
  Administered 2014-06-18: 2 mg via ORAL
  Administered 2014-06-19: 4 mg via ORAL
  Filled 2014-06-18: qty 1
  Filled 2014-06-18: qty 2

## 2014-06-18 MED ORDER — PHENYLEPHRINE 40 MCG/ML (10ML) SYRINGE FOR IV PUSH (FOR BLOOD PRESSURE SUPPORT)
PREFILLED_SYRINGE | INTRAVENOUS | Status: AC
  Filled 2014-06-18: qty 10

## 2014-06-18 MED ORDER — PROPOFOL 10 MG/ML IV BOLUS
INTRAVENOUS | Status: AC
  Filled 2014-06-18: qty 20

## 2014-06-18 MED ORDER — EPHEDRINE SULFATE 50 MG/ML IJ SOLN
INTRAMUSCULAR | Status: AC
  Filled 2014-06-18: qty 1

## 2014-06-18 MED ORDER — ALLOPURINOL 100 MG PO TABS
100.0000 mg | ORAL_TABLET | Freq: Every day | ORAL | Status: DC
  Filled 2014-06-18: qty 1

## 2014-06-18 MED ORDER — LIDOCAINE HCL (CARDIAC) 20 MG/ML IV SOLN
INTRAVENOUS | Status: AC
  Filled 2014-06-18: qty 5

## 2014-06-18 MED ORDER — ROCURONIUM BROMIDE 50 MG/5ML IV SOLN
INTRAVENOUS | Status: AC
  Filled 2014-06-18: qty 1

## 2014-06-18 MED ORDER — PHENOL 1.4 % MT LIQD: 1.0000 | OROMUCOSAL | Status: DC | PRN

## 2014-06-18 MED ORDER — HYDROMORPHONE HCL 1 MG/ML IJ SOLN
0.2500 mg | INTRAMUSCULAR | Status: DC | PRN
  Administered 2014-06-18 (×2): 0.5 mg via INTRAVENOUS
  Administered 2014-06-19: 1 mg via INTRAVENOUS
  Filled 2014-06-18 (×3): qty 1

## 2014-06-18 MED ORDER — ONDANSETRON HCL 4 MG/2ML IJ SOLN
INTRAMUSCULAR | Status: AC
  Filled 2014-06-18: qty 2

## 2014-06-18 MED ORDER — PREGABALIN 75 MG PO CAPS
75.0000 mg | ORAL_CAPSULE | Freq: Every day | ORAL | Status: DC
  Administered 2014-06-18: 75 mg via ORAL
  Filled 2014-06-18: qty 1

## 2014-06-18 MED ORDER — LACTATED RINGERS IV SOLN: INTRAVENOUS | Status: DC

## 2014-06-18 MED ORDER — GLYCOPYRROLATE 0.2 MG/ML IJ SOLN
INTRAMUSCULAR | Status: DC | PRN
  Administered 2014-06-18: 0.4 mg via INTRAVENOUS

## 2014-06-18 MED ORDER — ACETAMINOPHEN 650 MG RE SUPP: 650.0000 mg | Freq: Four times a day (QID) | RECTAL | Status: DC | PRN

## 2014-06-18 MED ORDER — BUPIVACAINE-EPINEPHRINE (PF) 0.5% -1:200000 IJ SOLN
INTRAMUSCULAR | Status: DC | PRN
  Administered 2014-06-18: 25 mL via PERINEURAL

## 2014-06-18 MED ORDER — OXYCODONE HCL 5 MG/5ML PO SOLN: 5.0000 mg | Freq: Once | ORAL | Status: DC | PRN

## 2014-06-18 MED ORDER — GLYCOPYRROLATE 0.2 MG/ML IJ SOLN
INTRAMUSCULAR | Status: AC
  Filled 2014-06-18: qty 2

## 2014-06-18 MED ORDER — MIDAZOLAM HCL 2 MG/2ML IJ SOLN
INTRAMUSCULAR | Status: AC
  Filled 2014-06-18: qty 2

## 2014-06-18 MED ORDER — MENTHOL 3 MG MT LOZG: 1.0000 | LOZENGE | OROMUCOSAL | Status: DC | PRN

## 2014-06-18 MED ORDER — ALUM & MAG HYDROXIDE-SIMETH 200-200-20 MG/5ML PO SUSP: 30.0000 mL | ORAL | Status: DC | PRN

## 2014-06-18 MED ORDER — METOCLOPRAMIDE HCL 5 MG/ML IJ SOLN: 5.0000 mg | Freq: Three times a day (TID) | INTRAMUSCULAR | Status: DC | PRN

## 2014-06-18 MED ORDER — NEOSTIGMINE METHYLSULFATE 10 MG/10ML IV SOLN
INTRAVENOUS | Status: DC | PRN
  Administered 2014-06-18: 3 mg via INTRAVENOUS

## 2014-06-18 MED ORDER — ONDANSETRON HCL 4 MG/2ML IJ SOLN: 4.0000 mg | Freq: Four times a day (QID) | INTRAMUSCULAR | Status: DC | PRN

## 2014-06-18 MED ORDER — ASPIRIN EC 81 MG PO TBEC
81.0000 mg | DELAYED_RELEASE_TABLET | Freq: Every day | ORAL | Status: DC
  Filled 2014-06-18: qty 1

## 2014-06-18 MED ORDER — SUCCINYLCHOLINE CHLORIDE 20 MG/ML IJ SOLN
INTRAMUSCULAR | Status: AC
  Filled 2014-06-18: qty 1

## 2014-06-18 MED ORDER — VALACYCLOVIR HCL 500 MG PO TABS
500.0000 mg | ORAL_TABLET | Freq: Every day | ORAL | Status: DC
  Filled 2014-06-18: qty 1

## 2014-06-18 MED ORDER — SODIUM CHLORIDE 0.9 % IR SOLN
Status: DC | PRN
  Administered 2014-06-18: 1000 mL

## 2014-06-18 MED ORDER — HYDROMORPHONE HCL 1 MG/ML IJ SOLN
0.2500 mg | INTRAMUSCULAR | Status: DC | PRN
  Administered 2014-06-18 (×4): 0.5 mg via INTRAVENOUS

## 2014-06-18 MED ORDER — PHENYLEPHRINE HCL 10 MG/ML IJ SOLN
10.0000 mg | INTRAVENOUS | Status: DC | PRN
  Administered 2014-06-18: 50 ug/min via INTRAVENOUS

## 2014-06-18 MED ORDER — TRAMADOL HCL 50 MG PO TABS
50.0000 mg | ORAL_TABLET | Freq: Four times a day (QID) | ORAL | Status: DC
  Administered 2014-06-18 – 2014-06-19 (×4): 50 mg via ORAL
  Filled 2014-06-18: qty 1
  Filled 2014-06-18: qty 2
  Filled 2014-06-18 (×3): qty 1

## 2014-06-18 MED ORDER — DOCUSATE SODIUM 100 MG PO CAPS
100.0000 mg | ORAL_CAPSULE | Freq: Two times a day (BID) | ORAL | Status: DC
  Administered 2014-06-18 – 2014-06-19 (×2): 100 mg via ORAL
  Filled 2014-06-18 (×4): qty 1

## 2014-06-18 MED ORDER — LIDOCAINE HCL (CARDIAC) 20 MG/ML IV SOLN
INTRAVENOUS | Status: DC | PRN
  Administered 2014-06-18: 80 mg via INTRAVENOUS

## 2014-06-18 MED ORDER — ASPIRIN 81 MG PO TABS: 81.0000 mg | ORAL_TABLET | Freq: Every day | ORAL | Status: DC

## 2014-06-18 MED ORDER — FENTANYL CITRATE 0.05 MG/ML IJ SOLN
INTRAMUSCULAR | Status: DC | PRN
  Administered 2014-06-18: 100 ug via INTRAVENOUS
  Administered 2014-06-18 (×3): 50 ug via INTRAVENOUS

## 2014-06-18 MED ORDER — ROCURONIUM BROMIDE 100 MG/10ML IV SOLN
INTRAVENOUS | Status: DC | PRN
  Administered 2014-06-18: 50 mg via INTRAVENOUS

## 2014-06-18 MED ORDER — PROPOFOL 10 MG/ML IV BOLUS
INTRAVENOUS | Status: DC | PRN
  Administered 2014-06-18: 150 mg via INTRAVENOUS

## 2014-06-18 MED ORDER — ONDANSETRON HCL 4 MG/2ML IJ SOLN
INTRAMUSCULAR | Status: DC | PRN
  Administered 2014-06-18: 4 mg via INTRAVENOUS

## 2014-06-18 MED ORDER — POLYETHYLENE GLYCOL 3350 17 G PO PACK: 17.0000 g | PACK | Freq: Every day | ORAL | Status: DC | PRN

## 2014-06-18 MED ORDER — PREGABALIN 75 MG PO CAPS
150.0000 mg | ORAL_CAPSULE | Freq: Every day | ORAL | Status: DC
  Administered 2014-06-19: 150 mg via ORAL
  Filled 2014-06-18: qty 2

## 2014-06-18 MED ORDER — OXYCODONE HCL 5 MG PO TABS: 5.0000 mg | ORAL_TABLET | Freq: Once | ORAL | Status: DC | PRN

## 2014-06-18 MED ORDER — DIAZEPAM 5 MG PO TABS: 2.5000 mg | ORAL_TABLET | Freq: Four times a day (QID) | ORAL | Status: DC | PRN

## 2014-06-18 MED ORDER — FLEET ENEMA 7-19 GM/118ML RE ENEM: 1.0000 | ENEMA | Freq: Once | RECTAL | Status: AC | PRN

## 2014-06-18 MED ORDER — TRAMADOL HCL 50 MG PO TABS: 50.0000 mg | ORAL_TABLET | Freq: Four times a day (QID) | ORAL | Status: DC | PRN

## 2014-06-18 MED ORDER — HYDROMORPHONE HCL 1 MG/ML IJ SOLN
INTRAMUSCULAR | Status: AC
  Administered 2014-06-18: 0.5 mg via INTRAVENOUS
  Filled 2014-06-18: qty 1

## 2014-06-18 MED ORDER — DIAZEPAM 5 MG PO TABS
5.0000 mg | ORAL_TABLET | Freq: Four times a day (QID) | ORAL | Status: DC | PRN
  Administered 2014-06-18 – 2014-06-19 (×4): 5 mg via ORAL
  Filled 2014-06-18 (×3): qty 1

## 2014-06-18 MED ORDER — PROMETHAZINE HCL 25 MG/ML IJ SOLN: 6.2500 mg | INTRAMUSCULAR | Status: DC | PRN

## 2014-06-18 MED ORDER — SIMVASTATIN 20 MG PO TABS
20.0000 mg | ORAL_TABLET | Freq: Every day | ORAL | Status: DC
  Administered 2014-06-18: 20 mg via ORAL
  Filled 2014-06-18 (×2): qty 1

## 2014-06-18 MED ORDER — ACETAMINOPHEN 325 MG PO TABS: 650.0000 mg | ORAL_TABLET | Freq: Four times a day (QID) | ORAL | Status: DC | PRN

## 2014-06-18 MED ORDER — LACTATED RINGERS IV SOLN
INTRAVENOUS | Status: DC
  Administered 2014-06-18: 15:00:00 via INTRAVENOUS

## 2014-06-18 MED ORDER — FENTANYL CITRATE 0.05 MG/ML IJ SOLN
INTRAMUSCULAR | Status: AC
  Filled 2014-06-18: qty 5

## 2014-06-18 MED ORDER — CHLORHEXIDINE GLUCONATE 4 % EX LIQD: 60.0000 mL | Freq: Once | CUTANEOUS | Status: DC

## 2014-06-18 SURGICAL SUPPLY — 72 items
BLADE SAW SGTL 83.5X18.5 (BLADE) ×2 IMPLANT
BODY ANATOMIC PROXIMAL SZ10 (Shoulder) ×2 IMPLANT
CEMENT BONE DEPUY (Cement) ×2 IMPLANT
CLSR STERI-STRIP ANTIMIC 1/2X4 (GAUZE/BANDAGES/DRESSINGS) ×2 IMPLANT
COVER SURGICAL LIGHT HANDLE (MISCELLANEOUS) ×2 IMPLANT
DERMABOND ADHESIVE PROPEN (GAUZE/BANDAGES/DRESSINGS) ×1
DERMABOND ADVANCED .7 DNX6 (GAUZE/BANDAGES/DRESSINGS) ×1 IMPLANT
DRAPE INCISE IOBAN 66X45 STRL (DRAPES) ×4 IMPLANT
DRAPE ORTHO SPLIT 77X108 STRL (DRAPES) ×2
DRAPE PROXIMA HALF (DRAPES) ×2 IMPLANT
DRAPE SURG 17X11 SM STRL (DRAPES) ×2 IMPLANT
DRAPE SURG 17X23 STRL (DRAPES) ×2 IMPLANT
DRAPE SURG ORHT 6 SPLT 77X108 (DRAPES) ×2 IMPLANT
DRAPE U-SHAPE 47X51 STRL (DRAPES) ×2 IMPLANT
DRILL BIT 7/64X5 (BIT) IMPLANT
DRSG AQUACEL AG ADV 3.5X10 (GAUZE/BANDAGES/DRESSINGS) ×2 IMPLANT
DRSG MEPILEX BORDER 4X4 (GAUZE/BANDAGES/DRESSINGS) ×2 IMPLANT
DRSG MEPILEX BORDER 4X8 (GAUZE/BANDAGES/DRESSINGS) ×2 IMPLANT
DRSG PAD ABDOMINAL 8X10 ST (GAUZE/BANDAGES/DRESSINGS) ×2 IMPLANT
DURAPREP 26ML APPLICATOR (WOUND CARE) ×4 IMPLANT
ELECT BLADE 4.0 EZ CLEAN MEGAD (MISCELLANEOUS) ×2
ELECT CAUTERY BLADE 6.4 (BLADE) ×2 IMPLANT
ELECT REM PT RETURN 9FT ADLT (ELECTROSURGICAL) ×2
ELECTRODE BLDE 4.0 EZ CLN MEGD (MISCELLANEOUS) ×1 IMPLANT
ELECTRODE REM PT RTRN 9FT ADLT (ELECTROSURGICAL) ×1 IMPLANT
FACESHIELD WRAPAROUND (MASK) ×6 IMPLANT
GLENOID ANCHOR PEG CROSSLK 48 (Orthopedic Implant) ×2 IMPLANT
GLOVE BIO SURGEON STRL SZ7.5 (GLOVE) ×2 IMPLANT
GLOVE BIO SURGEON STRL SZ8 (GLOVE) ×2 IMPLANT
GLOVE EUDERMIC 7 POWDERFREE (GLOVE) ×2 IMPLANT
GLOVE SS BIOGEL STRL SZ 7.5 (GLOVE) ×1 IMPLANT
GLOVE SUPERSENSE BIOGEL SZ 7.5 (GLOVE) ×1
GOWN STRL REUS W/ TWL LRG LVL3 (GOWN DISPOSABLE) ×1 IMPLANT
GOWN STRL REUS W/ TWL XL LVL3 (GOWN DISPOSABLE) ×2 IMPLANT
GOWN STRL REUS W/TWL LRG LVL3 (GOWN DISPOSABLE) ×1
GOWN STRL REUS W/TWL XL LVL3 (GOWN DISPOSABLE) ×2
HEAD HUMERAL ECCEN 52MX21M (Head) ×2 IMPLANT
KIT BASIN OR (CUSTOM PROCEDURE TRAY) ×2 IMPLANT
KIT ROOM TURNOVER OR (KITS) ×2 IMPLANT
MANIFOLD NEPTUNE II (INSTRUMENTS) ×2 IMPLANT
NDL SUT 6 .5 CRC .975X.05 MAYO (NEEDLE) ×1 IMPLANT
NEEDLE HYPO 25GX1X1/2 BEV (NEEDLE) IMPLANT
NEEDLE MAYO TAPER (NEEDLE) ×1
NS IRRIG 1000ML POUR BTL (IV SOLUTION) ×2 IMPLANT
PACK SHOULDER (CUSTOM PROCEDURE TRAY) ×2 IMPLANT
PAD ARMBOARD 7.5X6 YLW CONV (MISCELLANEOUS) ×4 IMPLANT
PASSER SUT SWANSON 36MM LOOP (INSTRUMENTS) IMPLANT
PIN METAGLENE 2.5 (PIN) ×6 IMPLANT
SLING ARM LRG ADULT FOAM STRAP (SOFTGOODS) IMPLANT
SLING ARM XL FOAM STRAP (SOFTGOODS) ×2 IMPLANT
SMARTMIX MINI TOWER (MISCELLANEOUS) ×2
SPONGE GAUZE 4X4 12PLY STER LF (GAUZE/BANDAGES/DRESSINGS) ×2 IMPLANT
SPONGE LAP 18X18 X RAY DECT (DISPOSABLE) ×2 IMPLANT
SPONGE LAP 4X18 X RAY DECT (DISPOSABLE) ×2 IMPLANT
STEM STANDARD SZ 10 113MM (Stem) ×2 IMPLANT
STEM STD SZ 10 113MM (Stem) ×1 IMPLANT
STRIP CLOSURE SKIN 1/2X4 (GAUZE/BANDAGES/DRESSINGS) ×2 IMPLANT
SUCTION FRAZIER TIP 10 FR DISP (SUCTIONS) ×2 IMPLANT
SUT BONE WAX W31G (SUTURE) IMPLANT
SUT FIBERWIRE #2 38 T-5 BLUE (SUTURE) ×4
SUT MNCRL AB 3-0 PS2 18 (SUTURE) ×2 IMPLANT
SUT VIC AB 1 CT1 27 (SUTURE) ×3
SUT VIC AB 1 CT1 27XBRD ANBCTR (SUTURE) ×3 IMPLANT
SUT VIC AB 2-0 CT1 27 (SUTURE) ×2
SUT VIC AB 2-0 CT1 TAPERPNT 27 (SUTURE) ×2 IMPLANT
SUTURE FIBERWR #2 38 T-5 BLUE (SUTURE) ×2 IMPLANT
SYR CONTROL 10ML LL (SYRINGE) IMPLANT
TAPE CLOTH SURG 6X10 WHT LF (GAUZE/BANDAGES/DRESSINGS) ×2 IMPLANT
TOWEL OR 17X24 6PK STRL BLUE (TOWEL DISPOSABLE) ×2 IMPLANT
TOWEL OR 17X26 10 PK STRL BLUE (TOWEL DISPOSABLE) ×2 IMPLANT
TOWER SMARTMIX MINI (MISCELLANEOUS) ×1 IMPLANT
WATER STERILE IRR 1000ML POUR (IV SOLUTION) ×2 IMPLANT

## 2014-06-18 NOTE — Anesthesia Preprocedure Evaluation (Addendum)
Anesthesia Evaluation  Patient identified by MRN, date of birth, ID band Patient awake    Reviewed: Allergy & Precautions, H&P , NPO status , Patient's Chart, lab work & pertinent test results  Airway Mallampati: II TM Distance: >3 FB Neck [name]: Full    Dental  (+) Teeth Intact, Dental Advisory Given   Pulmonary neg pulmonary ROS,  breath sounds clear to auscultation        Cardiovascular negative cardio ROS  Rhythm:Regular Rate:Normal     Neuro/Psych  Headaches, negative psych ROS   GI/Hepatic Neg liver ROS, GERD-  ,  Endo/Other  negative endocrine ROS  Renal/GU Renal disease  negative genitourinary   Musculoskeletal  (+) Arthritis -,   Abdominal   Peds  Hematology negative hematology ROS (+)   Anesthesia Other Findings   Reproductive/Obstetrics negative OB ROS                          Anesthesia Physical  Anesthesia Plan  ASA: I  Anesthesia Plan: General   Post-op Pain Management:    Induction: Intravenous  Airway Management Planned: Oral ETT  Additional Equipment:   Intra-op Plan:   Post-operative Plan: Extubation in OR  Informed Consent: I have reviewed the patients History and Physical, chart, labs and discussed the procedure including the risks, benefits and alternatives for the proposed anesthesia with the patient or authorized representative who has indicated his/her understanding and acceptance.   Dental advisory given  Plan Discussed with: CRNA, Anesthesiologist and Surgeon  Anesthesia Plan Comments:         Anesthesia Quick Evaluation

## 2014-06-18 NOTE — Progress Notes (Signed)
Orthopedic Tech Progress Note Patient Details:  [name] [name] Aug 09, 1951 [id number] Adjusted pt.'s arm sling to improve comfort.     Darrol Poke 06/18/2014, 4:00 PM

## 2014-06-18 NOTE — Progress Notes (Signed)
Utilization review completed.  

## 2014-06-18 NOTE — Op Note (Signed)
06/18/2014  10:03 AM  PATIENT:   [name] [name]  63 y.o. male  PRE-OPERATIVE DIAGNOSIS:  OA OF RIGHT SHOULDER  POST-OPERATIVE DIAGNOSIS:  same  PROCEDURE:  R TSA #10 press fit stem, 52x21 eccentric head, 48 glenoid  SURGEON:  Folasade Mooty, Metta Clines M.D.  ASSISTANTS: Shuford pac   ANESTHESIA:   GET + ISB  EBL: 200  SPECIMEN:  none  Drains: none   PATIENT DISPOSITION:  PACU - hemodynamically stable.    PLAN OF CARE: Admit to inpatient   Dictation# [id number]

## 2014-06-18 NOTE — Transfer of Care (Signed)
Immediate Anesthesia Transfer of Care Note  Patient: [name] [name]  Procedure(s) Performed: Procedure(s): RIGHT TOTAL SHOULDER ARTHROPLASTY (Right)  Patient Location: PACU  Anesthesia Type:General  Level of Consciousness: awake, sedated, patient cooperative and responds to stimulation  Airway & Oxygen Therapy: Patient Spontanous Breathing and Patient connected to nasal cannula oxygen  Post-op Assessment: Report given to PACU RN, Post -op Vital signs reviewed and stable and Patient moving all extremities  Post vital signs: Reviewed and stable  Complications: No apparent anesthesia complications

## 2014-06-18 NOTE — Anesthesia Procedure Notes (Addendum)
Anesthesia Regional Block:  Interscalene brachial plexus block  Pre-Anesthetic Checklist: ,, timeout performed, Correct Patient, Correct Site, Correct Laterality, Correct Procedure, Correct Position, site marked, Risks and benefits discussed,  Surgical consent,  Pre-op evaluation,  At surgeon's request and post-op pain management  Laterality: Right  Prep: chloraprep       Needles:  Injection technique: Single-shot  Needle Type: Echogenic Needle     Needle Length: 5cm 5 cm Needle Gauge: 21 and 21 G    Additional Needles:  Procedures: nerve stimulator Interscalene brachial plexus block  Nerve Stimulator or Paresthesia:  Response: Deltoid and triceps, 0.5 mA,   Additional Responses:   Narrative:  Start time: 06/18/2014 7:15 AM End time: 06/18/2014 7:25 AM Injection made incrementally with aspirations every 5 mL.  Performed by: Personally  Anesthesiologist: Suzette Battiest, MD  Additional Notes: Sterile prep and technique. Deltoid and triceps response noted at 0.73mA. Total of 25cc's 0.5% bupi with 1:200K epi injected in incremental fashion. No complications noted.

## 2014-06-18 NOTE — Anesthesia Postprocedure Evaluation (Signed)
  Anesthesia Post-op Note  Patient: [name] [name]  Procedure(s) Performed: Procedure(s): RIGHT TOTAL SHOULDER ARTHROPLASTY (Right)  Patient Location: PACU  Anesthesia Type:GA combined with regional for post-op pain  Level of Consciousness: awake, alert  and oriented  Airway and Oxygen Therapy: Patient Spontanous Breathing  Post-op Pain: none  Post-op Assessment: Post-op Vital signs reviewed. Imitrex given pre-op and post-op for severe migrane. Shoulder pain well controlled. Brief intraop type II heart block following reversal administration that resolved prior to extubation. EKG in PACU with NSR, and pt asymptomatic.  Post-op Vital Signs: Reviewed  Last Vitals:  Filed Vitals:   06/18/14 1330  BP:   Pulse: 67  Temp: 36.1 C  Resp: 14    Complications: No apparent anesthesia complications

## 2014-06-18 NOTE — H&P (Signed)
[name] [name]    Chief Complaint: OA OF RIGHT SHOULDER HPI: The patient is a 63 y.o. male with end stage right shoulder OA   Past Medical History  Diagnosis Date  . ALLERGIC RHINITIS 04/22/2007  . HYPERCHOLESTEROLEMIA 04/22/2007  . HYPERLIPIDEMIA 07/29/2007  . NEPHROLITHIASIS, HX OF 07/29/2007  . Osteoarth NOS-Unspec 04/22/2007  . Headache(784.0)     migraines  . DDD (degenerative disc disease), cervical   . Shoulder pain, acute     bilateral  . History of kidney stones   . History of skin cancer   . GERD (gastroesophageal reflux disease)     infrequently  . History of TMJ disorder   . Complication of anesthesia     more concerned about positioning of head & neck because of TMJ  . Cancer     HX SKIN CANCER    Past Surgical History  Procedure Laterality Date  . Appendectomy    . Knee arthroscopy      right x2  . Shoulder surgery      right  . Total knee arthroplasty Right 08/25/2013    Procedure: RIGHT TOTAL KNEE ARTHROPLASTY;  Surgeon: Gearlean Alf, MD;  Location: [facility];  Service: Orthopedics;  Laterality: Right;  . Joint replacement    . Total shoulder arthroplasty Left 12/25/2013    DR Godson Pollan   . Total shoulder arthroplasty Left 12/25/2013    Procedure: LEFT TOTAL SHOULDER ARTHROPLASTY;  Surgeon: Marin Shutter, MD;  Location: [place];  Service: Orthopedics;  Laterality: Left;    Family History  Problem Relation Age of Onset  . Cancer Neg Hx     family hx -breast and lung Ca  . COPD Neg Hx     family hx    Social History:  reports that he has never smoked. He has never used smokeless tobacco. He reports that he does not drink alcohol or use illicit drugs.  Allergies:  Allergies  Allergen Reactions  . Diphenhydramine Hcl Anaphylaxis and Rash    Only if it has dyes in it  . Shrimp [Shellfish Allergy] Anaphylaxis and Rash  . Rivaroxaban Rash  . Amoxicillin Other (See Comments)    Unknown   . Erythromycin Other (See Comments)    unknown  . Betadine [Povidone  Iodine] Rash  . Celebrex [Celecoxib] Rash  . Codeine Rash and Other (See Comments)    Blacked out  . Meperidine And Related Rash  . Methocarbamol Rash  . Penicillins Rash  . Plavix [Clopidogrel] Rash  . Vioxx [Rofecoxib] Rash    Medications Prior to Admission  Medication Sig Dispense Refill  . allopurinol (ZYLOPRIM) 100 MG tablet Take 100 mg by mouth every morning.      Marland Kitchen aspirin 81 MG tablet Take 81 mg by mouth daily.      . Cholecalciferol (VITAMIN D PO) Take 1 tablet by mouth daily.      . Multiple Vitamin (MULTIVITAMIN) tablet Take 1 tablet by mouth daily.      . Omega-3 Fatty Acids (FISH OIL) 1000 MG CAPS Take 1 capsule by mouth daily.      . pravastatin (PRAVACHOL) 40 MG tablet Take 40 mg by mouth every evening.      . pregabalin (LYRICA) 75 MG capsule Take 75-150 mg by mouth 2 (two) times daily. Pt takes 150mg  in a.m. and 75mg  in p.m.      Marland Kitchen Psyllium (METAMUCIL PO) Take 2 tablets by mouth 2 (two) times daily.      Marland Kitchen  Red Yeast Rice Extract (RED YEAST RICE PO) Take 1 tablet by mouth daily.      . SUMAtriptan (IMITREX) 100 MG tablet Take 100 mg by mouth every 2 (two) hours as needed for migraine or headache. May repeat in 2 hours if headache persists or recurs.      . TURMERIC CURCUMIN PO Take 1,050 mg by mouth daily.      . valACYclovir (VALTREX) 500 MG tablet Take 500 mg by mouth daily.      . vitamin C (ASCORBIC ACID) 500 MG tablet Take 500 mg by mouth daily.         Physical Exam: right shoulder with painful and restricted motion as noted at recent office visits  Vitals  Temp:  [98 F (36.7 C)] 98 F (36.7 C) (09/24 0544) Pulse Rate:  [57] 57 (09/24 0544) Resp:  [20] 20 (09/24 0544) BP: (112)/(62) 112/62 mmHg (09/24 0544) SpO2:  [96 %] 96 % (09/24 0544) Weight:  [87.998 kg (194 lb)] 87.998 kg (194 lb) (09/24 0544)  Assessment/Plan  Impression: OA OF RIGHT SHOULDER  Plan of Action: Procedure(s): RIGHT TOTAL SHOULDER ARTHROPLASTY  Teanna Elem M 06/18/2014, 7:25  AM

## 2014-06-18 NOTE — Progress Notes (Signed)
Pt emotional. His father is in an end of life situation out of state. Upset to" be holding in PACU and no privacy for expressiing emotions " Curtains closed and lights low. Wife at bedside.

## 2014-06-18 NOTE — Discharge Instructions (Signed)
° °  Kevin M. Supple, M.D., F.A.A.O.S. °Orthopaedic Surgery °Specializing in Arthroscopic and Reconstructive °Surgery of the Shoulder and Knee °[phone] °[address] - [place], [place] [address] - Fax [phone] ° ° °POST-OP TOTAL SHOULDER REPLACEMENT/SHOULDER HEMIARTHROPLASTY INSTRUCTIONS ° °1. Call the office at [phone] to schedule your first post-op appointment 10-14 days from the date of your surgery. ° °2. The bandage over your incision is waterproof. You may begin showering with this dressing on. You may leave this dressing on until first follow up appointment within 2 weeks. If you would like to remove it you may do so after the 5th day. Go slow and tug at the borders gently to break the bond the dressing has with the skin. The steri strips may come off with the dressing. At this point if there is no drainage it is okay to go without a bandage or you may cover it with a light guaze and tape. Leave the steri-strips in place over your incision. You can expect drainage that is bloody or yellow in nature that should gradually decrease from day of surgery. Change your dressing daily until drainage is completely resolved, then you may feel free to go without a bandage. You can also expect significant bruising around your shoulder that will drift down your arm and into your chest wall. This is very normal and should resolve over several days. ° ° 3. Wear your sling/immobilizer at all times except to perform the exercises below or to occasionally let your arm dangle by your side to stretch your elbow. You also need to sleep in your sling immobilizer until instructed otherwise. ° °4. Range of motion to your elbow, wrist, and hand are encouraged 3-5 times daily. Exercise to your hand and fingers helps to reduce swelling you may experience. ° °5. Utilize ice to the shoulder 3-5 times minimum a day and additionally if you are experiencing pain. ° °6. Prescriptions for a pain medication and a muscle  relaxant are provided for you. It is recommended that if you are experiencing pain that you pain medication alone is not controlling, add the muscle relaxant along with the pain medication which can give additional pain relief. The first 1-2 days is generally the most severe of your pain and then should gradually decrease. As your pain lessens it is recommended that you decrease your use of the pain medications to an "as needed basis'" only and to always comply with the recommended dosages of the pain medications. ° °7. Pain medications can produce constipation along with their use. If you experience this, the use of an over the counter stool softener or laxative daily is recommended.  ° °8. For most patients, if insurance allows, home health services to include therapy has been arranged. ° °9. For additional questions or concerns, please do not hesitate to call the office. If after hours there is an answering service to forward your concerns to the physician on call. ° °POST-OP EXERCISES ° °Pendulum Exercises ° °Perform pendulum exercises while standing and bending at the waist. Support your uninvolved arm on a table or chair and allow your operated arm to hang freely. Make sure to do these exercises passively - not using you shoulder muscles. ° °Repeat 20 times. Do 3 sessions per day. ° ° ° ° °

## 2014-06-19 ENCOUNTER — Encounter (HOSPITAL_COMMUNITY): Payer: Self-pay | Admitting: Orthopedic Surgery

## 2014-06-19 NOTE — Progress Notes (Signed)
OT Cancellation Note  Patient Details Name: [name] [name] MRN: [id number] DOB: 08/05/1951   Cancelled Treatment:    Reason Eval/Treat Not Completed: Other (comment). Spoke to [facility], PA-C who is going to cancel OT order. OT will not eval.   Almon Register [phone] 06/19/2014, 8:26 AM

## 2014-06-19 NOTE — Discharge Summary (Signed)
PATIENT ID:      [name] [name]  MRN:     [id number] DOB/AGE:    04-05-1951 / 63 y.o.     DISCHARGE SUMMARY  ADMISSION DATE:    06/18/2014 DISCHARGE DATE:    ADMISSION DIAGNOSIS: OA OF RIGHT SHOULDER Past Medical History  Diagnosis Date  . ALLERGIC RHINITIS 04/22/2007  . HYPERCHOLESTEROLEMIA 04/22/2007  . HYPERLIPIDEMIA 07/29/2007  . NEPHROLITHIASIS, HX OF 07/29/2007  . Osteoarth NOS-Unspec 04/22/2007  . Headache(784.0)     migraines  . DDD (degenerative disc disease), cervical   . Shoulder pain, acute     bilateral  . History of kidney stones   . History of skin cancer   . GERD (gastroesophageal reflux disease)     infrequently  . History of TMJ disorder   . Complication of anesthesia     more concerned about positioning of head & neck because of TMJ  . Cancer     HX SKIN CANCER    DISCHARGE DIAGNOSIS:   Active Problems:   S/P shoulder replacement   PROCEDURE: Procedure(s): RIGHT TOTAL SHOULDER ARTHROPLASTY on 06/18/2014  CONSULTS:   none  HISTORY:  See H&P in chart.  HOSPITAL COURSE:  [name] [name] is a 63 y.o. admitted on 06/18/2014 with a chief complaint of right shoulder pain and dysfunction, and found to have a diagnosis of OA OF RIGHT SHOULDER.  They were brought to the operating room on 06/18/2014 and underwent Procedure(s): RIGHT TOTAL SHOULDER ARTHROPLASTY.    They were given perioperative antibiotics: Anti-infectives   Start     Dose/Rate Route Frequency Ordered Stop   06/19/14 1000  valACYclovir (VALTREX) tablet 500 mg     500 mg Oral Daily 06/18/14 1359     06/18/14 1600  clindamycin (CLEOCIN) IVPB 600 mg     600 mg 100 mL/hr over 30 Minutes Intravenous Every 6 hours 06/18/14 1359 06/19/14 0419   06/18/14 0600  clindamycin (CLEOCIN) IVPB 900 mg     900 mg 100 mL/hr over 30 Minutes Intravenous On call to O.R. 06/17/14 1500 06/18/14 0810    .  Patient underwent the above named procedure and tolerated it well. The following day they were hemodynamically  stable and pain was controlled on oral analgesics. They were neurovascularly intact to the operative extremity.  They were medically and orthopaedically stable for discharge on 1. Unfortunately the patient lost his father yesterday and has had some extenuating circumstances causing increased emotional state as expected. As he had been through this shoulder replacement on the left I cancelled his OT order in order to allow a more expeditious discharge this am.    DIAGNOSTIC STUDIES:  RECENT RADIOGRAPHIC STUDIES :  No results found.  RECENT VITAL SIGNS:  Patient Vitals for the past 24 hrs:  BP Temp Temp src Pulse Resp SpO2  06/19/14 0500 99/58 mmHg 97.5 F (36.4 C) Oral 74 16 97 %  06/19/14 0000 105/54 mmHg 98.4 F (36.9 C) Oral 58 14 100 %  06/18/14 1932 99/63 mmHg 98.5 F (36.9 C) Oral 60 14 96 %  06/18/14 1345 112/68 mmHg 97 F (36.1 C) Oral 78 16 97 %  06/18/14 1330 - 97 F (36.1 C) - 67 14 99 %  06/18/14 1315 - - - 75 13 98 %  06/18/14 1300 120/68 mmHg - - 43 14 98 %  06/18/14 1200 113/73 mmHg 97.5 F (36.4 C) - 49 11 98 %  06/18/14 1145 115/71 mmHg - - 65 18 99 %  06/18/14 1130 112/70 mmHg - - 73 23 98 %  06/18/14 1115 128/86 mmHg - - 75 16 99 %  06/18/14 1100 141/89 mmHg - - 76 20 98 %  06/18/14 1045 133/87 mmHg - - 80 9 100 %  06/18/14 1034 129/88 mmHg - - 84 13 100 %  06/18/14 1030 - - - 78 10 100 %  06/18/14 1019 126/86 mmHg 98.1 F (36.7 C) - 78 14 100 %  .  RECENT EKG RESULTS:    Orders placed during the hospital encounter of 06/18/14  . EKG 12-LEAD  . EKG 12-LEAD  . EKG 12-LEAD  . EKG 12-LEAD    DISCHARGE INSTRUCTIONS:    DISCHARGE MEDICATIONS:     Medication List         allopurinol 100 MG tablet  Commonly known as:  ZYLOPRIM  Take 100 mg by mouth every morning.     aspirin 81 MG tablet  Take 81 mg by mouth daily.     diazepam 5 MG tablet  Commonly known as:  VALIUM  Take 0.5-1 tablets (2.5-5 mg total) by mouth every 6 (six) hours as needed for  muscle spasms or sedation.     Fish Oil 1000 MG Caps  Take 1 capsule by mouth daily.     HYDROmorphone 2 MG tablet  Commonly known as:  DILAUDID  Take 1-2 tablets (2-4 mg total) by mouth every 4 (four) hours as needed for severe pain.     METAMUCIL PO  Take 2 tablets by mouth 2 (two) times daily.     multivitamin tablet  Take 1 tablet by mouth daily.     pravastatin 40 MG tablet  Commonly known as:  PRAVACHOL  Take 40 mg by mouth every evening.     pregabalin 75 MG capsule  Commonly known as:  LYRICA  Take 75-150 mg by mouth 2 (two) times daily. Pt takes 150mg  in a.m. and 75mg  in p.m.     RED YEAST RICE PO  Take 1 tablet by mouth daily.     SUMAtriptan 100 MG tablet  Commonly known as:  IMITREX  Take 100 mg by mouth every 2 (two) hours as needed for migraine or headache. May repeat in 2 hours if headache persists or recurs.     traMADol 50 MG tablet  Commonly known as:  ULTRAM  Take 1 tablet (50 mg total) by mouth every 6 (six) hours as needed for moderate pain.     TURMERIC CURCUMIN PO  Take 1,050 mg by mouth daily.     valACYclovir 500 MG tablet  Commonly known as:  VALTREX  Take 500 mg by mouth daily.     vitamin C 500 MG tablet  Commonly known as:  ASCORBIC ACID  Take 500 mg by mouth daily.     VITAMIN D PO  Take 1 tablet by mouth daily.        FOLLOW UP VISIT:       Follow-up Information   Follow up with Marin Shutter, MD. (call to be seen in 10-14 days)    Specialty:  Orthopedic Surgery   Contact information:   [address] [place] [address] [phone]       DISCHARGE TO: Home   DISPOSITION: Good   DISCHARGE CONDITION:  Festus Barren for Dr. Justice Britain 06/19/2014, 8:33 AM

## 2014-06-19 NOTE — Op Note (Signed)
NAME:  [name] [name], [name] [name] NO.:  [id number]  MEDICAL RECORD NO.:  [id number]  LOCATION:  [facility]                         FACILITY:  [facility]  PHYSICIAN:  Metta Clines. Antinio Sanderfer, M.D.  DATE OF BIRTH:  05/30/1951  DATE OF PROCEDURE:  06/18/2014 DATE OF DISCHARGE:                              OPERATIVE REPORT   PREOPERATIVE DIAGNOSIS:  End-stage right shoulder osteoarthritis.  POSTOPERATIVE DIAGNOSIS:  End-stage right shoulder osteoarthritis.  PROCEDURE:  Right total shoulder arthroplasty utilizing a press-fit size 10 DePuy modular stem, a 52 x 21 eccentric head, and a cemented pegged 48 glenoid.  SURGEON:  Metta Clines. Ismahan Lippman, M.D.  Terrence DupontOlivia Mackie A. Shuford, P.A.-C.  ANESTHESIA:  General endotracheal as well as an interscalene block.  ESTIMATED BLOOD LOSS:  200 mL.  DRAINS:  None.  HISTORY:  [name] [name] is a 63 year old gentleman who has had chronic and progressively increasing right shoulder pain related end-stage osteoarthritis.  His symptoms have been refractory to prolonged and multiple attempts at conservative management.  His plain radiographs show a marked degenerative change with complete obliteration of the joint space, subchondral sclerosis, and peripheral osteophyte formation. Due to his ongoing pain and increasing functional limitations, he was brought to the operating room at this time for planned right total shoulder arthroplasty as described below.  Preoperatively, I counseled [name] [name] regarding treatment options and risks versus benefits thereof.  Possible surgical complications were all reviewed including potential for bleeding, infection, neurovascular injury, persistent pain, loss of motion, anesthetic complication, failure of the implant, possible need for further surgery, all were reviewed.  He understands and accepts and agrees with our planned procedure.  PROCEDURE IN DETAIL:  After undergoing routine preop evaluation, the patient received  prophylactic antibiotics and an interscalene block was established in the holding area by the Anesthesia Department.  Placed supine on the operating table, underwent smooth induction of general endotracheal anesthesia.  Placed in beach-chair position and appropriate padding protected.  The right shoulder girdle region was then sterilely prepped and draped in standard fashion.  Time-out was called.  An anterior deltopectoral approach to the right shoulder was made through a 10 cm incision anteriorly.  Skin flaps were elevated.  Electrocautery was used for hemostasis.  Dissection carried deeply with cephalic vein identified, retracted lateral to deltoid and the deltopectoral interval was then developed from proximal to distal with blunt dissection and electrocautery was used for hemostasis.  The upper __________ tendon was then tenotomized to enhance exposure.  Conjoined tendon was mobilized, retracted medially, and adhesions developed, divided beneath the deltoid and self-retaining retractors were placed.  At this point, the bicipital groove was then unroofed, biceps tendon was tenotomized for later tenodesis.  We then divided the rotator cuff along the rotator interval and apex of the bicipital groove to the base of the coracoid and then divided the subscapularis from the lesser tuberosity leaving a centimeter and a half cuff of tissue for later repair and the free margin of the subscap was tagged with #2 FiberWire sutures.  Dissection was carried then anteriorly, inferiorly, and allowing delivery of the humeral head and dividing the capsule from the inferior humeral neck __________ complete delivery of the  head through the wound.  The articular surfaces were completely denuded of cartilage and eburnated bone was noted.  At this point, we outlined our proposed humeral head resection with the extramedullary guide.  I performed this with an oscillating saw maintaining approximately 30  degrees retroversion matching the native retroversion and carefully protecting the rotator cuff.  At this point, performed hand reaming of the humeral canal up to size 10 which matched the opposite side and it was excellent fit.  We then performed our broach with size 10 stem.  The peripheral osteophytes were then removed with the rongeur.  Metal cap was placed with cut surface of the proximal humerus.  At this point, we gained exposure of the glenoid and I should note that there was moderate retroversion and gaining exposure was technically challenging, but with soft tissue releases, we were able to gain appropriate exposure and a guide pin was then placed into the center of the glenoid and did have to correct fairly significant retroversion __________ completely using our 48 low profile reamer and then rongeur was used to remove bone from the margins of the glenoid particularly superiorly and then used the hand reamer to complete preparation of the glenoid to ensure that there was no bony impingement and proper seating could be achieved.  After we had obtained a stable and flat glenoid surface which corrected the retroversion, we placed our central drill hole followed by the peripheral peg holes.  The trial 48 glenoid showed excellent fit.  At this point, the glenoid was irrigated.  Hemostasis was obtained.  Cement was mixed in the appropriate consistency.  We introduced cement into peripheral peg holes and then impacted our 48 glenoid with excellent fit and fixation. Cement was allowed to harden.  At this time, we returned our attention to the proximal humerus where the canal was irrigated.  We seated our stem and did use bone graft harvested from the humeral head to bone graft the proximal aspect of our stem and performed terminal seating of the stem with excellent fit fixation.  At this point, a series of trial reduction was performed and ultimately the 52 x 21 eccentric head  showed the best soft tissue balance.  The final 52 x 18 was impacted.  Shoulder was reduced, that showed nice 50% translation of the humeral head over the glenoid and excellent soft tissue balance.  The joint was copiously irrigated.  Hemostasis was obtained.  The subscapularis was then mobilized and then repaired back to the lesser tuberosity with #2 Fiber wires.  The rotator interval was closed with a pair of figure-of-eight #2 fiber wires.  Biceps tendon was __________ major.  Suture limbs were all appropriately clipped.  The joint again was irrigated.  Hemostasis was obtained.  The deltopectoral interval was then reapproximated with a series of #1 figure-of-eight Vicryl sutures.  2-0 Vicryl was used for the subcu layer and intracuticular 3-0 Monocryl for the skin, followed by Dermabond and dry dressing.  The right arm was placed into by a sling.  The patient was awakened, extubated, and taken to recovery room in stable condition.  Tracy A. Shuford, P.A.-C. was used as an Environmental consultant throughout this case, __________ for help with positioning the patient, positioning of the extremity, management of the retractors, tissue manipulation, implantation of paresthesias, wound closer, and intraoperative decision making.     Metta Clines. Niaja Stickley, M.D.     KMS/MEDQ  D:  06/18/2014  T:  06/18/2014  Job:  [id number]

## 2014-06-22 ENCOUNTER — Other Ambulatory Visit: Payer: Self-pay | Admitting: Specialist

## 2014-06-22 DIAGNOSIS — M5136 Other intervertebral disc degeneration, lumbar region: Secondary | ICD-10-CM

## 2014-06-26 ENCOUNTER — Ambulatory Visit
Admission: RE | Admit: 2014-06-26 | Discharge: 2014-06-26 | Disposition: A | Discharge status: Home / Self Care | Payer: Managed Care, Other (non HMO) | Source: Ambulatory Visit | Attending: Specialist | Admitting: Specialist

## 2014-06-26 DIAGNOSIS — M5136 Other intervertebral disc degeneration, lumbar region: Secondary | ICD-10-CM

## 2014-06-26 MED ORDER — METHYLPREDNISOLONE ACETATE 40 MG/ML INJ SUSP (RADIOLOG
120.0000 mg | Freq: Once | INTRAMUSCULAR | Status: AC
  Administered 2014-06-26: 120 mg via EPIDURAL

## 2014-06-26 MED ORDER — IOHEXOL 180 MG/ML  SOLN
1.0000 mL | Freq: Once | INTRAMUSCULAR | Status: AC | PRN
  Administered 2014-06-26: 1 mL via EPIDURAL

## 2014-06-26 NOTE — Discharge Instructions (Signed)

## 2014-07-06 ENCOUNTER — Ambulatory Visit: Payer: Managed Care, Other (non HMO) | Attending: Orthopedic Surgery

## 2014-07-06 DIAGNOSIS — Z96651 Presence of right artificial knee joint: Secondary | ICD-10-CM | POA: Diagnosis not present

## 2014-07-06 DIAGNOSIS — M25511 Pain in right shoulder: Secondary | ICD-10-CM | POA: Diagnosis not present

## 2014-07-06 DIAGNOSIS — M25619 Stiffness of unspecified shoulder, not elsewhere classified: Secondary | ICD-10-CM | POA: Insufficient documentation

## 2014-07-06 DIAGNOSIS — Z5189 Encounter for other specified aftercare: Secondary | ICD-10-CM | POA: Diagnosis present

## 2014-07-06 DIAGNOSIS — M6281 Muscle weakness (generalized): Secondary | ICD-10-CM | POA: Diagnosis not present

## 2014-07-06 DIAGNOSIS — Z96612 Presence of left artificial shoulder joint: Secondary | ICD-10-CM | POA: Diagnosis not present

## 2014-07-08 ENCOUNTER — Ambulatory Visit: Payer: Managed Care, Other (non HMO) | Admitting: Physical Therapy

## 2014-07-08 DIAGNOSIS — Z5189 Encounter for other specified aftercare: Secondary | ICD-10-CM | POA: Diagnosis not present

## 2014-07-13 ENCOUNTER — Ambulatory Visit: Payer: Managed Care, Other (non HMO) | Admitting: Physical Therapy

## 2014-07-13 DIAGNOSIS — Z5189 Encounter for other specified aftercare: Secondary | ICD-10-CM | POA: Diagnosis not present

## 2014-07-15 ENCOUNTER — Ambulatory Visit: Payer: Managed Care, Other (non HMO) | Admitting: Physical Therapy

## 2014-07-15 DIAGNOSIS — Z5189 Encounter for other specified aftercare: Secondary | ICD-10-CM | POA: Diagnosis not present

## 2014-07-20 ENCOUNTER — Ambulatory Visit: Payer: Managed Care, Other (non HMO) | Admitting: Physical Therapy

## 2014-07-20 DIAGNOSIS — Z5189 Encounter for other specified aftercare: Secondary | ICD-10-CM | POA: Diagnosis not present

## 2014-07-22 ENCOUNTER — Ambulatory Visit: Payer: Managed Care, Other (non HMO) | Admitting: Physical Therapy

## 2014-07-22 DIAGNOSIS — Z5189 Encounter for other specified aftercare: Secondary | ICD-10-CM | POA: Diagnosis not present

## 2014-07-25 ENCOUNTER — Other Ambulatory Visit: Payer: Self-pay | Admitting: Internal Medicine

## 2014-07-27 ENCOUNTER — Ambulatory Visit: Payer: Managed Care, Other (non HMO) | Attending: Orthopedic Surgery | Admitting: Physical Therapy

## 2014-07-27 DIAGNOSIS — Z5189 Encounter for other specified aftercare: Secondary | ICD-10-CM | POA: Insufficient documentation

## 2014-07-27 DIAGNOSIS — Z96612 Presence of left artificial shoulder joint: Secondary | ICD-10-CM | POA: Insufficient documentation

## 2014-07-27 DIAGNOSIS — M25619 Stiffness of unspecified shoulder, not elsewhere classified: Secondary | ICD-10-CM | POA: Insufficient documentation

## 2014-07-27 DIAGNOSIS — M25511 Pain in right shoulder: Secondary | ICD-10-CM | POA: Diagnosis not present

## 2014-07-27 DIAGNOSIS — M6281 Muscle weakness (generalized): Secondary | ICD-10-CM | POA: Diagnosis not present

## 2014-07-27 DIAGNOSIS — Z96651 Presence of right artificial knee joint: Secondary | ICD-10-CM | POA: Insufficient documentation

## 2014-07-29 ENCOUNTER — Ambulatory Visit: Payer: Managed Care, Other (non HMO) | Admitting: Physical Therapy

## 2014-07-29 ENCOUNTER — Telehealth: Payer: Self-pay | Admitting: Internal Medicine

## 2014-07-29 DIAGNOSIS — Z5189 Encounter for other specified aftercare: Secondary | ICD-10-CM | POA: Diagnosis not present

## 2014-07-29 DIAGNOSIS — R202 Paresthesia of skin: Secondary | ICD-10-CM

## 2014-07-29 NOTE — Telephone Encounter (Signed)
Okay to do referral to Neurologist?

## 2014-07-29 NOTE — Telephone Encounter (Signed)
ok 

## 2014-07-29 NOTE — Telephone Encounter (Signed)
Pt needs referral to neurologist ASAP. Pt insurance run out on 08-24-14. Pt is having electrical tingling everywhere and needs to see Neurologist

## 2014-07-30 ENCOUNTER — Ambulatory Visit: Payer: Managed Care, Other (non HMO) | Admitting: Physical Therapy

## 2014-07-30 DIAGNOSIS — Z5189 Encounter for other specified aftercare: Secondary | ICD-10-CM | POA: Diagnosis not present

## 2014-07-31 ENCOUNTER — Other Ambulatory Visit (INDEPENDENT_AMBULATORY_CARE_PROVIDER_SITE_OTHER): Payer: Managed Care, Other (non HMO)

## 2014-07-31 DIAGNOSIS — Z0000 Encounter for general adult medical examination without abnormal findings: Secondary | ICD-10-CM

## 2014-07-31 LAB — CBC WITH DIFFERENTIAL/PLATELET
Basophils Absolute: 0.1 10*3/uL (ref 0.0–0.1)
Basophils Relative: 0.9 % (ref 0.0–3.0)
EOS PCT: 3.8 % (ref 0.0–5.0)
Eosinophils Absolute: 0.2 10*3/uL (ref 0.0–0.7)
HCT: 43.5 % (ref 39.0–52.0)
Hemoglobin: 14.5 g/dL (ref 13.0–17.0)
Lymphocytes Relative: 35.1 % (ref 12.0–46.0)
Lymphs Abs: 2 10*3/uL (ref 0.7–4.0)
MCHC: 33.4 g/dL (ref 30.0–36.0)
MCV: 96.1 fl (ref 78.0–100.0)
MONOS PCT: 9 % (ref 3.0–12.0)
Monocytes Absolute: 0.5 10*3/uL (ref 0.1–1.0)
NEUTROS PCT: 51.2 % (ref 43.0–77.0)
Neutro Abs: 2.9 10*3/uL (ref 1.4–7.7)
PLATELETS: 313 10*3/uL (ref 150.0–400.0)
RBC: 4.53 Mil/uL (ref 4.22–5.81)
RDW: 14.8 % (ref 11.5–15.5)
WBC: 5.7 10*3/uL (ref 4.0–10.5)

## 2014-07-31 LAB — POCT URINALYSIS DIPSTICK
Bilirubin, UA: NEGATIVE
GLUCOSE UA: NEGATIVE
Ketones, UA: NEGATIVE
Leukocytes, UA: NEGATIVE
Nitrite, UA: NEGATIVE
Protein, UA: NEGATIVE
RBC UA: NEGATIVE
SPEC GRAV UA: 1.01
Urobilinogen, UA: 0.2
pH, UA: 7

## 2014-07-31 LAB — BASIC METABOLIC PANEL
BUN: 23 mg/dL (ref 6–23)
CO2: 26 mEq/L (ref 19–32)
CREATININE: 1 mg/dL (ref 0.4–1.5)
Calcium: 9.1 mg/dL (ref 8.4–10.5)
Chloride: 105 mEq/L (ref 96–112)
GFR: 81.05 mL/min (ref >60.00)
Glucose, Bld: 94 mg/dL (ref 70–99)
POTASSIUM: 4.7 meq/L (ref 3.5–5.1)
Sodium: 141 mEq/L (ref 135–145)

## 2014-07-31 LAB — HEPATIC FUNCTION PANEL
ALT: 22 U/L (ref 0–53)
AST: 26 U/L (ref 0–37)
Albumin: 3.1 g/dL — ABNORMAL LOW (ref 3.5–5.2)
Alkaline Phosphatase: 53 U/L (ref 39–117)
BILIRUBIN DIRECT: 0.1 mg/dL (ref 0.0–0.3)
BILIRUBIN TOTAL: 0.4 mg/dL (ref 0.2–1.2)
Total Protein: 6.7 g/dL (ref 6.0–8.3)

## 2014-07-31 LAB — LIPID PANEL
CHOL/HDL RATIO: 4
CHOLESTEROL: 203 mg/dL — AB (ref 0–200)
HDL: 56.3 mg/dL (ref >39.00)
LDL Cholesterol: 122 mg/dL — ABNORMAL HIGH (ref 0–99)
NonHDL: 146.7
TRIGLYCERIDES: 122 mg/dL (ref 0.0–149.0)
VLDL: 24.4 mg/dL (ref 0.0–40.0)

## 2014-07-31 LAB — PSA: PSA: 3.54 ng/mL (ref 0.10–4.00)

## 2014-07-31 LAB — TSH: TSH: 1.53 u[IU]/mL (ref 0.35–4.50)

## 2014-08-03 ENCOUNTER — Ambulatory Visit: Payer: Managed Care, Other (non HMO) | Admitting: Physical Therapy

## 2014-08-03 DIAGNOSIS — Z5189 Encounter for other specified aftercare: Secondary | ICD-10-CM | POA: Diagnosis not present

## 2014-08-05 ENCOUNTER — Ambulatory Visit: Payer: Managed Care, Other (non HMO) | Admitting: Physical Therapy

## 2014-08-05 DIAGNOSIS — Z5189 Encounter for other specified aftercare: Secondary | ICD-10-CM | POA: Diagnosis not present

## 2014-08-07 ENCOUNTER — Encounter: Payer: Self-pay | Admitting: Physical Therapy

## 2014-08-10 ENCOUNTER — Ambulatory Visit: Payer: Managed Care, Other (non HMO) | Admitting: Physical Therapy

## 2014-08-10 DIAGNOSIS — Z5189 Encounter for other specified aftercare: Secondary | ICD-10-CM | POA: Diagnosis not present

## 2014-08-12 ENCOUNTER — Encounter: Payer: Self-pay | Admitting: Internal Medicine

## 2014-08-12 ENCOUNTER — Ambulatory Visit (INDEPENDENT_AMBULATORY_CARE_PROVIDER_SITE_OTHER): Payer: Managed Care, Other (non HMO) | Admitting: Internal Medicine

## 2014-08-12 ENCOUNTER — Ambulatory Visit: Payer: Managed Care, Other (non HMO) | Admitting: Physical Therapy

## 2014-08-12 VITALS — BP 130/86 | HR 66 | Temp 98.3°F | Resp 20 | Ht 69.0 in | Wt 195.0 lb

## 2014-08-12 DIAGNOSIS — Z5189 Encounter for other specified aftercare: Secondary | ICD-10-CM | POA: Diagnosis not present

## 2014-08-12 DIAGNOSIS — Z0000 Encounter for general adult medical examination without abnormal findings: Secondary | ICD-10-CM

## 2014-08-12 NOTE — Progress Notes (Signed)
Subjective:    Patient ID: [name] [name], male    DOB: [birth year], 63 y.o.   MRN: [id number]  HPI 63 year old patient who is seen today for a preventive health examination. He has had a difficult year and has had right knee and bilateral shoulder surgeries.  During the past calendar year.  Most recently, he is recovering from right shoulder surgery and rehabilitation has been slow.  Past Medical History  Diagnosis Date  . ALLERGIC RHINITIS 04/22/2007  . HYPERCHOLESTEROLEMIA 04/22/2007  . HYPERLIPIDEMIA 07/29/2007  . NEPHROLITHIASIS, HX OF 07/29/2007  . Osteoarth NOS-Unspec 04/22/2007  . Headache(784.0)     migraines  . DDD (degenerative disc disease), cervical   . Shoulder pain, acute     bilateral  . History of kidney stones   . History of skin cancer   . GERD (gastroesophageal reflux disease)     infrequently  . History of TMJ disorder   . Complication of anesthesia     more concerned about positioning of head & neck because of TMJ  . Cancer     HX SKIN CANCER    History   Social History  . Marital Status: Married    Spouse Name: N/A    Number of Children: N/A  . Years of Education: N/A   Occupational History  . Not on file.   Social History Main Topics  . Smoking status: Never Smoker   . Smokeless tobacco: Never Used  . Alcohol Use: No  . Drug Use: No  . Sexual Activity: Not on file   Other Topics Concern  . Not on file   Social History Narrative    Past Surgical History  Procedure Laterality Date  . Appendectomy    . Knee arthroscopy      right x2  . Shoulder surgery      right  . Total knee arthroplasty Right 08/25/2013    Procedure: RIGHT TOTAL KNEE ARTHROPLASTY;  Surgeon: Gearlean Alf, MD;  Location: [facility];  Service: Orthopedics;  Laterality: Right;  . Joint replacement    . Total shoulder arthroplasty Left 12/25/2013    DR SUPPLE   . Total shoulder arthroplasty Left 12/25/2013    Procedure: LEFT TOTAL SHOULDER ARTHROPLASTY;  Surgeon: Marin Shutter, MD;  Location: [place] [facility];  Service: Orthopedics;  Laterality: Left;  . Total shoulder arthroplasty Right 06/18/2014    DR SUPPLE  . Total shoulder arthroplasty Right 06/18/2014    Procedure: RIGHT TOTAL SHOULDER ARTHROPLASTY;  Surgeon: Marin Shutter, MD;  Location: [facility];  Service: Orthopedics;  Laterality: Right;    Family History  Problem Relation Age of Onset  . Cancer Neg Hx     family hx -breast and lung Ca  . COPD Neg Hx     family hx    Allergies  Allergen Reactions  . Diphenhydramine Hcl Anaphylaxis and Rash    Only if it has dyes in it  . Shrimp [Shellfish Allergy] Anaphylaxis and Rash  . Codeine Rash and Other (See Comments)    Blacked out  . Diazepam Rash    Rash over whole body from neck down, and he lost his voice (airway compromise?)  . Amoxicillin Other (See Comments)    Unknown   . Erythromycin Other (See Comments)    unknown  . Betadine [Povidone Iodine] Rash  . Celebrex [Celecoxib] Rash  . Meperidine And Related Rash  . Methocarbamol Rash  . Penicillins Rash  . Plavix [Clopidogrel] Rash  . Rivaroxaban  Rash  . Vioxx [Rofecoxib] Rash    Current Outpatient Prescriptions on File Prior to Visit  Medication Sig Dispense Refill  . allopurinol (ZYLOPRIM) 100 MG tablet Take 100 mg by mouth every morning.    Marland Kitchen allopurinol (ZYLOPRIM) 100 MG tablet TAKE ONE TABLET BY MOUTH ONCE DAILY 90 tablet 0  . aspirin 81 MG tablet Take 81 mg by mouth daily.    . Cholecalciferol (VITAMIN D PO) Take 1 tablet by mouth daily.    . diazepam (VALIUM) 5 MG tablet Take 0.5-1 tablets (2.5-5 mg total) by mouth every 6 (six) hours as needed for muscle spasms or sedation. 40 tablet 1  . HYDROmorphone (DILAUDID) 2 MG tablet Take 1-2 tablets (2-4 mg total) by mouth every 4 (four) hours as needed for severe pain. 60 tablet 0  . Multiple Vitamin (MULTIVITAMIN) tablet Take 1 tablet by mouth daily.    . Omega-3 Fatty Acids (FISH OIL) 1000 MG CAPS Take 1 capsule by mouth daily.    .  pravastatin (PRAVACHOL) 40 MG tablet TAKE ONE TABLET BY MOUTH ONCE DAILY. 90 tablet 0  . pregabalin (LYRICA) 75 MG capsule Take 75-150 mg by mouth 2 (two) times daily. Pt takes 150mg  in a.m. and 75mg  in p.m.    Marland Kitchen Psyllium (METAMUCIL PO) Take 2 tablets by mouth 2 (two) times daily.    . Red Yeast Rice Extract (RED YEAST RICE PO) Take 1 tablet by mouth daily.    . SUMAtriptan (IMITREX) 100 MG tablet Take 100 mg by mouth every 2 (two) hours as needed for migraine or headache. May repeat in 2 hours if headache persists or recurs.    . traMADol (ULTRAM) 50 MG tablet Take 1 tablet (50 mg total) by mouth every 6 (six) hours as needed for moderate pain. 50 tablet 1  . TURMERIC CURCUMIN PO Take 1,050 mg by mouth daily.    . valACYclovir (VALTREX) 500 MG tablet Take 500 mg by mouth daily.    . vitamin C (ASCORBIC ACID) 500 MG tablet Take 500 mg by mouth daily.     No current facility-administered medications on file prior to visit.    BP 130/86 mmHg  Pulse 66  Temp(Src) 98.3 F (36.8 C) (Oral)  Resp 20  Ht 5\' 9"  (1.753 m)  Wt 195 lb (88.451 kg)  BMI 28.78 kg/m2  SpO2 96%      Review of Systems  Constitutional: Negative for fever, chills, activity change, appetite change and fatigue.  HENT: Negative for congestion, dental problem, ear pain, hearing loss, mouth sores, rhinorrhea, sinus pressure, sneezing, tinnitus, trouble swallowing and voice change.   Eyes: Negative for photophobia, pain, redness and visual disturbance.  Respiratory: Negative for apnea, cough, choking, chest tightness, shortness of breath and wheezing.   Cardiovascular: Negative for chest pain, palpitations and leg swelling.  Gastrointestinal: Negative for nausea, vomiting, abdominal pain, diarrhea, constipation, blood in stool, abdominal distention, anal bleeding and rectal pain.  Genitourinary: Negative for dysuria, urgency, frequency, hematuria, flank pain, decreased urine volume, discharge, penile swelling, scrotal  swelling, difficulty urinating, genital sores and testicular pain.  Musculoskeletal: Positive for arthralgias, neck pain and neck stiffness. Negative for myalgias, back pain, joint swelling and gait problem.  Skin: Negative for color change, rash and wound.  Neurological: Positive for weakness and headaches. Negative for dizziness, tremors, seizures, syncope, facial asymmetry, speech difficulty, light-headedness and numbness.  Hematological: Negative for adenopathy. Does not bruise/bleed easily.  Psychiatric/Behavioral: Negative for suicidal ideas, hallucinations, behavioral problems, confusion, sleep disturbance,  self-injury, dysphoric mood, decreased concentration and agitation. The patient is not nervous/anxious.        Objective:   Physical Exam  Constitutional: He appears well-developed and well-nourished.  HENT:  Head: Normocephalic and atraumatic.  Right Ear: External ear normal.  Left Ear: External ear normal.  Nose: Nose normal.  Mouth/Throat: Oropharynx is clear and moist.  Eyes: Conjunctivae and EOM are normal. Pupils are equal, round, and reactive to light. No scleral icterus.  Neck: Normal range of motion. Neck supple. No JVD present. No thyromegaly present.  Cardiovascular: Regular rhythm, normal heart sounds and intact distal pulses.  Exam reveals no gallop and no friction rub.   No murmur heard. Pulmonary/Chest: Effort normal and breath sounds normal. He exhibits no tenderness.  Abdominal: Soft. Bowel sounds are normal. He exhibits no distension and no mass. There is no tenderness.  Genitourinary: Penis normal. Guaiac negative stool.  Plus 2 enlarged, symmetrical  Musculoskeletal: Normal range of motion. He exhibits no edema or tenderness.  Status post bilateral shoulder surgeries Status post right total knee replacement  Lymphadenopathy:    He has no cervical adenopathy.  Neurological: He is alert. He has normal reflexes. No cranial nerve deficit. Coordination normal.    Skin: Skin is warm and dry. No rash noted.  Psychiatric: He has a normal mood and affect. His behavior is normal.          Assessment & Plan:   Preventive health examination Generalized osteoarthritis Migraine headaches, stable Dyslipidemia, controlled on statin therapy  Medicines updated  Recheck one year or as needed

## 2014-08-12 NOTE — Progress Notes (Signed)
Pre visit review using our clinic review tool, if applicable. No additional management support is needed unless otherwise documented below in the visit note. 

## 2014-08-12 NOTE — Patient Instructions (Addendum)
It is important that you exercise regularly, at least 20 minutes 3 to 4 times per week.  If you develop chest pain or shortness of breath seek  medical attention.  Limit your sodium (Salt) intake  Return in one year for follow-up  Health Maintenance A healthy lifestyle and preventative care can promote health and wellness.  Maintain regular health, dental, and eye exams.  Eat a healthy diet. Foods like vegetables, fruits, whole grains, low-fat dairy products, and lean protein foods contain the nutrients you need and are low in calories. Decrease your intake of foods high in solid fats, added sugars, and salt. Get information about a proper diet from your health care provider, if necessary.  Regular physical exercise is one of the most important things you can do for your health. Most adults should get at least 150 minutes of moderate-intensity exercise (any activity that increases your heart rate and causes you to sweat) each week. In addition, most adults need muscle-strengthening exercises on 2 or more days a week.   Maintain a healthy weight. The body mass index (BMI) is a screening tool to identify possible weight problems. It provides an estimate of body fat based on height and weight. Your health care provider can find your BMI and can help you achieve or maintain a healthy weight. For males 20 years and older:  A BMI below 18.5 is considered underweight.  A BMI of 18.5 to 24.9 is normal.  A BMI of 25 to 29.9 is considered overweight.  A BMI of 30 and above is considered obese.  Maintain normal blood lipids and cholesterol by exercising and minimizing your intake of saturated fat. Eat a balanced diet with plenty of fruits and vegetables. Blood tests for lipids and cholesterol should begin at age 20 and be repeated every 5 years. If your lipid or cholesterol levels are high, you are over age 50, or you are at high risk for heart disease, you may need your cholesterol levels checked  more frequently.Ongoing high lipid and cholesterol levels should be treated with medicines if diet and exercise are not working.  If you smoke, find out from your health care provider how to quit. If you do not use tobacco, do not start.  Lung cancer screening is recommended for adults aged 55-80 years who are at high risk for developing lung cancer because of a history of smoking. A yearly low-dose CT scan of the lungs is recommended for people who have at least a 30-pack-year history of smoking and are current smokers or have quit within the past 15 years. A pack year of smoking is smoking an average of 1 pack of cigarettes a day for 1 year (for example, a 30-pack-year history of smoking could mean smoking 1 pack a day for 30 years or 2 packs a day for 15 years). Yearly screening should continue until the smoker has stopped smoking for at least 15 years. Yearly screening should be stopped for people who develop a health problem that would prevent them from having lung cancer treatment.  If you choose to drink alcohol, do not have more than 2 drinks per day. One drink is considered to be 12 oz (360 mL) of beer, 5 oz (150 mL) of wine, or 1.5 oz (45 mL) of liquor.  Avoid the use of street drugs. Do not share needles with anyone. Ask for help if you need support or instructions about stopping the use of drugs.  High blood pressure causes heart disease and   increases the risk of stroke. Blood pressure should be checked at least every 1-2 years. Ongoing high blood pressure should be treated with medicines if weight loss and exercise are not effective.  If you are 45-79 years old, ask your health care provider if you should take aspirin to prevent heart disease.  Diabetes screening involves taking a blood sample to check your fasting blood sugar level. This should be done once every 3 years after age 45 if you are at a normal weight and without risk factors for diabetes. Testing should be considered at a  younger age or be carried out more frequently if you are overweight and have at least 1 risk factor for diabetes.  Colorectal cancer can be detected and often prevented. Most routine colorectal cancer screening begins at the age of 50 and continues through age 75. However, your health care provider may recommend screening at an earlier age if you have risk factors for colon cancer. On a yearly basis, your health care provider may provide home test kits to check for hidden blood in the stool. A small camera at the end of a tube may be used to directly examine the colon (sigmoidoscopy or colonoscopy) to detect the earliest forms of colorectal cancer. Talk to your health care provider about this at age 50 when routine screening begins. A direct exam of the colon should be repeated every 5-10 years through age 75, unless early forms of precancerous polyps or small growths are found.  People who are at an increased risk for hepatitis B should be screened for this virus. You are considered at high risk for hepatitis B if:  You were born in a country where hepatitis B occurs often. Talk with your health care provider about which countries are considered high risk.  Your parents were born in a high-risk country and you have not received a shot to protect against hepatitis B (hepatitis B vaccine).  You have HIV or AIDS.  You use needles to inject street drugs.  You live with, or have sex with, someone who has hepatitis B.  You are a man who has sex with other men (MSM).  You get hemodialysis treatment.  You take certain medicines for conditions like cancer, organ transplantation, and autoimmune conditions.  Hepatitis C blood testing is recommended for all people born from 1945 through 1965 and any individual with known risk factors for hepatitis C.  Healthy men should no longer receive prostate-specific antigen (PSA) blood tests as part of routine cancer screening. Talk to your health care provider  about prostate cancer screening.  Testicular cancer screening is not recommended for adolescents or adult males who have no symptoms. Screening includes self-exam, a health care provider exam, and other screening tests. Consult with your health care provider about any symptoms you have or any concerns you have about testicular cancer.  Practice safe sex. Use condoms and avoid high-risk sexual practices to reduce the spread of sexually transmitted infections (STIs).  You should be screened for STIs, including gonorrhea and chlamydia if:  You are sexually active and are younger than 24 years.  You are older than 24 years, and your health care provider tells you that you are at risk for this type of infection.  Your sexual activity has changed since you were last screened, and you are at an increased risk for chlamydia or gonorrhea. Ask your health care provider if you are at risk.  If you are at risk of being infected with HIV,   it is recommended that you take a prescription medicine daily to prevent HIV infection. This is called pre-exposure prophylaxis (PrEP). You are considered at risk if:  You are a man who has sex with other men (MSM).  You are a heterosexual man who is sexually active with multiple partners.  You take drugs by injection.  You are sexually active with a partner who has HIV.  Talk with your health care provider about whether you are at high risk of being infected with HIV. If you choose to begin PrEP, you should first be tested for HIV. You should then be tested every 3 months for as long as you are taking PrEP.  Use sunscreen. Apply sunscreen liberally and repeatedly throughout the day. You should seek shade when your shadow is shorter than you. Protect yourself by wearing long sleeves, pants, a wide-brimmed hat, and sunglasses year round whenever you are outdoors.  Tell your health care provider of new moles or changes in moles, especially if there is a change in shape  or color. Also, tell your health care provider if a mole is larger than the size of a pencil eraser.  A one-time screening for abdominal aortic aneurysm (AAA) and surgical repair of large AAAs by ultrasound is recommended for men aged 65-75 years who are current or former smokers.  Stay current with your vaccines (immunizations). Document Released: 03/09/2008 Document Revised: 09/16/2013 Document Reviewed: 02/06/2011 [employer] Patient Information 2015 [employer]. This information is not intended to replace advice given to you by your health care provider. Make sure you discuss any questions you have with your health care provider.  

## 2014-08-13 ENCOUNTER — Ambulatory Visit: Payer: Managed Care, Other (non HMO) | Admitting: Physical Therapy

## 2014-08-13 DIAGNOSIS — Z5189 Encounter for other specified aftercare: Secondary | ICD-10-CM | POA: Diagnosis not present

## 2014-08-17 ENCOUNTER — Ambulatory Visit: Payer: Managed Care, Other (non HMO) | Admitting: Physical Therapy

## 2014-08-17 DIAGNOSIS — Z5189 Encounter for other specified aftercare: Secondary | ICD-10-CM | POA: Diagnosis not present

## 2014-08-18 ENCOUNTER — Encounter: Payer: Self-pay | Admitting: Neurology

## 2014-08-18 ENCOUNTER — Ambulatory Visit (INDEPENDENT_AMBULATORY_CARE_PROVIDER_SITE_OTHER): Payer: Managed Care, Other (non HMO) | Admitting: Neurology

## 2014-08-18 VITALS — BP 124/80 | HR 76 | Temp 98.2°F | Resp 16 | Ht 69.0 in | Wt 195.4 lb

## 2014-08-18 DIAGNOSIS — M5417 Radiculopathy, lumbosacral region: Secondary | ICD-10-CM

## 2014-08-18 DIAGNOSIS — R200 Anesthesia of skin: Secondary | ICD-10-CM

## 2014-08-18 DIAGNOSIS — R202 Paresthesia of skin: Secondary | ICD-10-CM

## 2014-08-18 NOTE — Patient Instructions (Addendum)
1.  I think you would benefit most from seeing Dr. Hulan Saas (a Sport's medicine physician who performs osteopathic manipulative medicine) or physical therapy.  We will put in both referrals and you can look up and decide which you prefer 2.  Other consideration would be to increase Lyrica back to 2 tablets twice daily 3.  I would like to get the report of the lumbar MRI as well as a disc with the imaging. 4.  Follow up in 3 months or as needed.

## 2014-08-18 NOTE — Progress Notes (Signed)
NEUROLOGY CONSULTATION NOTE  [name] [name] MRN: [id number] DOB: 1951-04-30  Referring provider: Dr. Burnice Logan Primary care provider: Dr. 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  He developed low back pain several years ago, which he attributed to often carrying heavy luggage while travelling back and forth to [place] for work.  He had been taking Lyrica 150mg  twice daily for several years.  About three months ago, he began tapering down to 75mg  daily.  Around that time, he took a long car ride up to [place] to visit his father.  He developed exacerbation of his back pain and pain into the buttocks.  He increased the Lyrica up to 75mg  twice daily.  About 11 months ago, he underwent total right knee surgery.  He was unable to walk correctly for a while, which caused pressure on his back.  About 5 months ago, he underwent left shoulder surgery and then right shoulder surgery 7 weeks ago.  Since his shoulder surgeries, he was told to walk hunched over a bit.  He was always a very active person and has not been able to exercise except for some walking.  When he would back, he began noticing numbness in his feet.  He also was experiencing electric shock pain in all his extremities.  This all resolved.  But now he still notes a throbbing pain in his right buttocks.  It is most noticeable with he is standing.  It is better when he is sitting.  There is no pain shooting down the leg or numbness into the foot at this time.  There is no weakness.  He had an MRI of the lumbar spine performed at his orthopedist's office where he was told he had a pinched nerve which would  require surgery.  He did have an epidural performed, which seemed to help after a week.  He would rather not have to have another surgery and is looking for other options.  PAST MEDICAL HISTORY: Past Medical History  Diagnosis Date  . ALLERGIC RHINITIS 04/22/2007  . HYPERCHOLESTEROLEMIA 04/22/2007  . HYPERLIPIDEMIA 07/29/2007  . NEPHROLITHIASIS, HX OF 07/29/2007  . Osteoarth NOS-Unspec 04/22/2007  . Headache(784.0)     migraines  . DDD (degenerative disc disease), cervical   . Shoulder pain, acute     bilateral  . History of kidney stones   . History of skin cancer   . GERD (gastroesophageal reflux disease)     infrequently  . History of TMJ disorder   . Complication of anesthesia     more concerned about positioning of head & neck because of TMJ  . Cancer     HX SKIN CANCER    PAST SURGICAL HISTORY: Past Surgical History  Procedure Laterality Date  . Appendectomy    . Knee arthroscopy      right x2  . Shoulder surgery      right  . Total knee arthroplasty Right 08/25/2013    Procedure: RIGHT TOTAL KNEE ARTHROPLASTY;  Surgeon: Gearlean Alf, MD;  Location: [facility];  Service: Orthopedics;  Laterality: Right;  . Joint replacement    . Total shoulder arthroplasty Left 12/25/2013    DR SUPPLE   . Total shoulder arthroplasty Left  12/25/2013    Procedure: LEFT TOTAL SHOULDER ARTHROPLASTY;  Surgeon: Marin Shutter, MD;  Location: [facility];  Service: Orthopedics;  Laterality: Left;  . Total shoulder arthroplasty Right 06/18/2014    DR SUPPLE  . Total shoulder arthroplasty Right 06/18/2014    Procedure: RIGHT TOTAL SHOULDER ARTHROPLASTY;  Surgeon: Marin Shutter, MD;  Location: [facility];  Service: Orthopedics;  Laterality: Right;    MEDICATIONS: Current Outpatient Prescriptions on File Prior to Visit  Medication Sig Dispense Refill  . allopurinol (ZYLOPRIM) 100 MG tablet Take 100 mg by mouth every morning.    Marland Kitchen allopurinol (ZYLOPRIM) 100 MG tablet TAKE ONE TABLET BY MOUTH ONCE DAILY 90  tablet 0  . aspirin 81 MG tablet Take 81 mg by mouth daily.    . Cholecalciferol (VITAMIN D PO) Take 1 tablet by mouth daily.    . diazepam (VALIUM) 5 MG tablet Take 0.5-1 tablets (2.5-5 mg total) by mouth every 6 (six) hours as needed for muscle spasms or sedation. 40 tablet 1  . Multiple Vitamin (MULTIVITAMIN) tablet Take 1 tablet by mouth daily.    . Omega-3 Fatty Acids (FISH OIL) 1000 MG CAPS Take 1 capsule by mouth daily.    . pravastatin (PRAVACHOL) 40 MG tablet TAKE ONE TABLET BY MOUTH ONCE DAILY. 90 tablet 0  . pregabalin (LYRICA) 75 MG capsule Take 75-150 mg by mouth 2 (two) times daily. Pt takes 150mg  in a.m. and 75mg  in p.m.    Marland Kitchen Psyllium (METAMUCIL PO) Take 2 tablets by mouth 2 (two) times daily.    . Red Yeast Rice Extract (RED YEAST RICE PO) Take 1 tablet by mouth daily.    . SUMAtriptan (IMITREX) 100 MG tablet Take 100 mg by mouth every 2 (two) hours as needed for migraine or headache. May repeat in 2 hours if headache persists or recurs.    . traMADol (ULTRAM) 50 MG tablet Take 1 tablet (50 mg total) by mouth every 6 (six) hours as needed for moderate pain. 50 tablet 1  . TURMERIC CURCUMIN PO Take 1,050 mg by mouth daily.    . valACYclovir (VALTREX) 500 MG tablet Take 500 mg by mouth daily.    . vitamin C (ASCORBIC ACID) 500 MG tablet Take 500 mg by mouth daily.    Marland Kitchen HYDROmorphone (DILAUDID) 2 MG tablet Take 1-2 tablets (2-4 mg total) by mouth every 4 (four) hours as needed for severe pain. (Patient not taking: Reported on 08/18/2014) 60 tablet 0   No current facility-administered medications on file prior to visit.    ALLERGIES: Allergies  Allergen Reactions  . Diphenhydramine Hcl Anaphylaxis and Rash    Only if it has dyes in it  . Shrimp [Shellfish Allergy] Anaphylaxis and Rash  . Codeine Rash and Other (See Comments)    Blacked out  . Diazepam Rash    Rash over whole body from neck down, and he lost his voice (airway compromise?)  . Amoxicillin Other (See Comments)      Unknown   . Erythromycin Other (See Comments)    unknown  . Betadine [Povidone Iodine] Rash  . Celebrex [Celecoxib] Rash  . Meperidine And Related Rash  . Methocarbamol Rash  . Penicillins Rash  . Plavix [Clopidogrel] Rash  . Rivaroxaban Rash  . Vioxx [Rofecoxib] Rash    FAMILY HISTORY: Family History  Problem Relation Age of Onset  . COPD Father     family hx  . Aneurysm Mother   . Cancer Maternal Grandfather  lung    SOCIAL HISTORY: History   Social History  . Marital Status: Married    Spouse Name: N/A    Number of Children: N/A  . Years of Education: N/A   Occupational History  . Not on file.   Social History Main Topics  . Smoking status: Never Smoker   . Smokeless tobacco: Never Used  . Alcohol Use: No  . Drug Use: No  . Sexual Activity:    Partners: Female   Other Topics Concern  . Not on file   Social History Narrative    REVIEW OF SYSTEMS: Constitutional: No fevers, chills, or sweats, no generalized fatigue, change in appetite Eyes: No visual changes, double vision, eye pain Ear, nose and throat: No hearing loss, ear pain, nasal congestion, sore throat Cardiovascular: No chest pain, palpitations Respiratory:  No shortness of breath at rest or with exertion, wheezes GastrointestinaI: No nausea, vomiting, diarrhea, abdominal pain, fecal incontinence Genitourinary:  No dysuria, urinary retention or frequency Musculoskeletal:  As above Integumentary: No rash, pruritus, skin lesions Neurological: as above Psychiatric: No depression, insomnia, anxiety Endocrine: No palpitations, fatigue, diaphoresis, mood swings, change in appetite, change in weight, increased thirst Hematologic/Lymphatic:  No anemia, purpura, petechiae. Allergic/Immunologic: no itchy/runny eyes, nasal congestion, recent allergic reactions, rashes  PHYSICAL EXAM: Filed Vitals:   08/18/14 1512  BP: 124/80  Pulse: 76  Temp: 98.2 F (36.8 C)  Resp: 16   General: No  acute distress Head:  Normocephalic/atraumatic Eyes:  fundi unremarkable, without vessel changes, exudates, hemorrhages or papilledema. CN III, IV, VI:  full range of motion, no nystagmus, no ptosis Neck: supple, no paraspinal tenderness, full range of motion Back: No paraspinal tenderness Heart: regular rate and rhythm Lungs: Clear to auscultation bilaterally. Vascular: No carotid bruits. Neurological Exam: Mental status: alert and oriented to person, place, and time, recent and remote memory intact, fund of knowledge intact, attention and concentration intact, speech fluent and not dysarthric, language intact. Cranial nerves: CN I: not tested CN II: pupils equal, round and reactive to light, visual fields intact, fundi unremarkable, without vessel changes, exudates, hemorrhages or papilledema. CN III, IV, VI:  full range of motion, no nystagmus, no ptosis CN V: facial sensation intact CN VII: upper and lower face symmetric CN VIII: hearing intact CN IX, X: gag intact, uvula midline CN XI: sternocleidomastoid and trapezius muscles intact CN XII: tongue midline Bulk & Tone: normal, no fasciculations. Motor:  4+ in the right deltoid, 5-/5 in the left deltoid, otherwise 5/5. Sensation:  Pinprick and vibration sensation intact. Deep Tendon Reflexes:  2+ throughout, toes downgoing. Finger to nose testing:  No dysmetria Gait:  Normal station and stride. Romberg negative.  IMPRESSION: Right lumbosacral radiculopathy.  Probably aggravated during the long car ride and abnormal posture over the past year due to knee and shoulder surgeries.  PLAN: 1.  I recommend receiving some kind of physical therapy.  I recommended seeing Dr. Hulan Saas, who performs OMT.  Another option would be PT.  He would like to look into the co-pay for both options before making a decision. 2.  Otherwise, I recommend increasing the Lyrica back up to 150mg  twice daily 3.  We asked to get the MRI scan and report of  the lumbar spine from his orthopedist. 4.  He will contact us with any questions.  He can follow up in 3 months or as needed.  45 minutes spent with patient, over 50% spent with discussing diagnosis and treatment options.  Thank  you for allowing me to take part in the care of this patient.  Metta Clines, DO  CC:  Nilda Simmer, MD

## 2014-08-19 ENCOUNTER — Ambulatory Visit: Payer: Managed Care, Other (non HMO)

## 2014-08-19 DIAGNOSIS — Z5189 Encounter for other specified aftercare: Secondary | ICD-10-CM | POA: Diagnosis not present

## 2014-08-24 ENCOUNTER — Ambulatory Visit (INDEPENDENT_AMBULATORY_CARE_PROVIDER_SITE_OTHER): Payer: Managed Care, Other (non HMO) | Admitting: Family Medicine

## 2014-08-24 ENCOUNTER — Ambulatory Visit: Payer: Managed Care, Other (non HMO) | Admitting: Physical Therapy

## 2014-08-24 ENCOUNTER — Encounter: Payer: Self-pay | Admitting: Family Medicine

## 2014-08-24 VITALS — BP 118/80 | HR 65 | Ht 69.0 in | Wt 202.0 lb

## 2014-08-24 DIAGNOSIS — M5416 Radiculopathy, lumbar region: Secondary | ICD-10-CM | POA: Insufficient documentation

## 2014-08-24 DIAGNOSIS — G5700 Lesion of sciatic nerve, unspecified lower limb: Secondary | ICD-10-CM

## 2014-08-24 DIAGNOSIS — G5701 Lesion of sciatic nerve, right lower limb: Secondary | ICD-10-CM

## 2014-08-24 DIAGNOSIS — Z5189 Encounter for other specified aftercare: Secondary | ICD-10-CM | POA: Diagnosis not present

## 2014-08-24 NOTE — Progress Notes (Signed)
[name] [address] [place], [place] [address] Phone: [phone] Subjective:    I'm seeing this patient by the request  of:  Dr. Tomi Likens  CC: Low back pain with radicular symptoms.  [id number] [name] [name] is a 63 y.o. male coming in with complaint of low back pain with radicular symptoms. Patient does have significant amount of osteoarthritic changes of multiple joints and has had total replacement of the shoulders well his right knee within the last 10 months. In addition of this patient is taking clotted, tramadol and some over-the-counter medications for pain relief including Lyrica. Patient was seen by neurology recently for a follow-up of numbness and weakness in the lower extremities. Patient has also been seen by [facility] orthopedics and has had a workup for low back pain. Patient at that time was scheduled for another dural steroid injection and did have this and has approximately 1 week of relief after one week after the injection. Patient was told that surgical intervention may be necessary. Patient is wondering what other therapies would be beneficial.  Describes the pain is more of a dull aching throbbing sensation in the lower back but can wake him up at night. He can cause him to scream out in pain. Patient states that it feels like an electric shock going down his leg and can go into all his extremities. States that most of the pain seems to be in the right buttocks region. Notices the pain more with standing. Seems to be better with sitting. Patient rates the pain as 9 out of 10.   Patient did bring his most recent MRI. This MRI was reviewed by me and does show that patient had intraluminal and foraminal narrowing on the right side at L4-L5 as well as some on L5-S1. Patient also has a grade 1 spondlisthesis.     Past medical history, social, surgical and family history all reviewed in electronic medical record.   Review of Systems: No  headache, visual changes, nausea, vomiting, diarrhea, constipation, dizziness, abdominal pain, skin rash, fevers, chills, night sweats, weight loss, swollen lymph nodes, body aches, joint swelling, muscle aches, chest pain, shortness of breath, mood changes.   Objective Blood pressure 118/80, pulse 65, height 5\' 9"  (1.753 m), weight 202 lb (91.627 kg), SpO2 96 %.  General: No apparent distress alert and oriented x3 mood and affect normal, dressed appropriately.  HEENT: Pupils equal, extraocular movements intact  Respiratory: Patient's speak in full sentences and does not appear short of breath  Cardiovascular: No lower extremity edema, non tender, no erythema  Skin: Warm dry intact with no signs of infection or rash on extremities or on axial skeleton.  Abdomen: Soft nontender  Neuro: Cranial nerves II through XII are intact, neurovascularly intact in all extremities with 2+ DTRs and 2+ pulses.  Lymph: No lymphadenopathy of posterior or anterior cervical chain or axillae bilaterally.  Gait normal with good balance and coordination.  MSK:  Non tender with full range of motion and good stability and symmetric strength and tone with bilateral shoulder replacements and right-sided knee replacement.   Back Exam:  Inspection: Unremarkable  Motion: Flexion 35 deg, Extension 45 deg, Side Bending to 45 deg bilaterally,  Rotation to 45 deg bilaterally  SLR laying: Negative  XSLR laying: Negative  Palpable tenderness: Mild tenderness in the paraspinal musculature on the right side as well as the right SI joint [name]: Positive right Sensory change: Gross sensation intact to all lumbar and  sacral dermatomes.  Reflexes: 2+ at both patellar tendons, 2+ at achilles tendons, Babinski's downgoing.  Strength at foot  Plantar-flexion: 5/5 Dorsi-flexion: 5/5 Eversion: 5/5 Inversion: 5/5  Leg strength  Quad: 5/5 Hamstring: 5/5 Hip flexor: 5/5 Hip abductors: 4/5  Gait unremarkable.     Impression and  Recommendations:     This case required medical decision making of moderate complexity.

## 2014-08-24 NOTE — Patient Instructions (Signed)
Nice to meet you Reviewing your MRI you do have a nerve being pinched considerably that still could be causing the pain but we will try different approach Ice 20 minutes 2 times daily. Usually after activity and before bed. Exercises 3 times a week or after walking. Alternate with back exercises.  Tennis ball to back right pocket with sitting.  Exercises on wall.  Heel and butt touching.  Raise leg 6 inches and hold 2 seconds.  Down slow for count of 4 seconds.  1 set of 30 reps daily on both sides.  Physical therapy visit 1 time to go over the home exercises.  Continue the vitamins.  See me again in 4 weeks. There is a good chance I might not be able to do too much and then would look more at your back or try another epidural.

## 2014-08-24 NOTE — Assessment & Plan Note (Signed)
Piriformis Syndrome  Using an anatomical model, reviewed with the patient the structures involved and how they related to diagnosis. The patient indicated understanding.   The patient was given a handout from Dr. Rouzier's book "The Sports Medicine Patient Advisor" describing the anatomy and rehabilitation of the following condition: Piriformis Syndrome  Also given a handout with more extensive Piriformis stretching, hip flexor and abductor strengthening, ham stretching  Rec deep massage, explained self-massage with ball RTC in 4 weeks 

## 2014-08-24 NOTE — Assessment & Plan Note (Signed)
Discussed with patient at great length. Due to patient's chronic comorbidities and medications it was somewhat difficult to assess. Patient does have good strength and is able to walk without any significant pain. Patient on exam today does not appear to be any significant pain. Radicular symptoms he states are improving slowly. Like to not be significantly progressive. Patient is going to more conservative approach. Patient will go to formal physical therapy for one visit. Continue all other medications. We will attempt to treat this as a possible piriformis syndrome but once again discussed with him that lumbar radiculopathy as the most likely diagnosis. Patient will follow-up again in 4 weeks. If continuing to have pain I would consider repeat of the epidural steroid injection to see if we get any other relief.

## 2014-08-25 ENCOUNTER — Ambulatory Visit: Payer: Managed Care, Other (non HMO) | Admitting: Family Medicine

## 2014-08-26 ENCOUNTER — Ambulatory Visit: Payer: Managed Care, Other (non HMO) | Admitting: Family Medicine

## 2014-09-01 ENCOUNTER — Ambulatory Visit: Payer: Medicare Other | Attending: Family Medicine

## 2014-09-01 DIAGNOSIS — M25511 Pain in right shoulder: Secondary | ICD-10-CM | POA: Insufficient documentation

## 2014-09-01 DIAGNOSIS — Z5189 Encounter for other specified aftercare: Secondary | ICD-10-CM | POA: Diagnosis present

## 2014-09-01 DIAGNOSIS — M25619 Stiffness of unspecified shoulder, not elsewhere classified: Secondary | ICD-10-CM | POA: Diagnosis not present

## 2014-09-01 DIAGNOSIS — M6281 Muscle weakness (generalized): Secondary | ICD-10-CM | POA: Diagnosis not present

## 2014-09-01 DIAGNOSIS — Z96612 Presence of left artificial shoulder joint: Secondary | ICD-10-CM | POA: Insufficient documentation

## 2014-09-01 DIAGNOSIS — Z96651 Presence of right artificial knee joint: Secondary | ICD-10-CM | POA: Insufficient documentation

## 2014-09-01 DIAGNOSIS — M545 Low back pain: Secondary | ICD-10-CM

## 2014-09-01 NOTE — Therapy (Addendum)
Outpatient Rehabilitation [facility] [address] [place], [place], [address] Phone: [phone]   Fax:  [phone]  Physical Therapy Evaluation  Patient Details  Name: [name] [name] MRN: [id number] Date of Birth: 1951/09/14  Encounter Date: 09/01/2014      PT End of Session - 09/01/14 1325    Visit Number 1   Number of Visits 12   Date for PT Re-Evaluation 10/12/14   PT Start Time 0930   PT Stop Time 1020   PT Time Calculation (min) 50 min   Activity Tolerance Patient tolerated treatment well      Past Medical History  Diagnosis Date  . ALLERGIC RHINITIS 04/22/2007  . HYPERCHOLESTEROLEMIA 04/22/2007  . HYPERLIPIDEMIA 07/29/2007  . NEPHROLITHIASIS, HX OF 07/29/2007  . Osteoarth NOS-Unspec 04/22/2007  . Headache(784.0)     migraines  . DDD (degenerative disc disease), cervical   . Shoulder pain, acute     bilateral  . History of kidney stones   . History of skin cancer   . GERD (gastroesophageal reflux disease)     infrequently  . History of TMJ disorder   . Complication of anesthesia     more concerned about positioning of head & neck because of TMJ  . Cancer     HX SKIN CANCER    Past Surgical History  Procedure Laterality Date  . Appendectomy    . Knee arthroscopy      right x2  . Shoulder surgery      right  . Total knee arthroplasty Right 08/25/2013    Procedure: RIGHT TOTAL KNEE ARTHROPLASTY;  Surgeon: Gearlean Alf, MD;  Location: [facility];  Service: Orthopedics;  Laterality: Right;  . Joint replacement    . Total shoulder arthroplasty Left 12/25/2013    DR SUPPLE   . Total shoulder arthroplasty Left 12/25/2013    Procedure: LEFT TOTAL SHOULDER ARTHROPLASTY;  Surgeon: Marin Shutter, MD;  Location: [facility];  Service: Orthopedics;  Laterality: Left;  . Total shoulder arthroplasty Right 06/18/2014    DR SUPPLE  . Total shoulder arthroplasty Right 06/18/2014    Procedure: RIGHT TOTAL SHOULDER ARTHROPLASTY;  Surgeon: Marin Shutter, MD;   Location: [facility];  Service: Orthopedics;  Laterality: Right;    There were no vitals taken for this visit.  Visit Diagnosis:  Midline low back pain, with sciatica presence unspecified      Subjective Assessment - 09/01/14 0941    Symptoms Pt reports chronic hx of R upper gluteal pain and lower back pain. MRI indicated stenosis of L4 L5 on R. Neurologist recommended surgery, but pt would like to try a more conservative approach.     Pertinent History Bilsat TSA, R TKA,    Limitations Sitting;Lifting;Standing;Walking   How long can you sit comfortably? 30 mins   How long can you stand comfortably? 2 mins   How long can you walk comfortably? 1 mile    Patient Stated Goals walk longer    Currently in Pain? Yes   Pain Score 0-No pain   Pain Location Back  Glute region   Pain Orientation Lower   Pain Descriptors / Indicators Tingling;Aching;Shooting  electrical impulses into whole body    Pain Type Chronic pain   Aggravating Factors  standing and walking          [name] PT Assessment - 09/01/14 0944    Assessment   Medical Diagnosis piriformis syndrome   Onset Date 09/01/06   Next MD Visit 10/02/14   Prior  Therapy PT for shoulders and knee   Precautions   Precautions None   Restrictions   Weight Bearing Restrictions No   Balance Screen   Has the patient fallen in the past 6 months No   [place] Private residence   Living Arrangements Spouse/significant other   Home Access Level entry   [place] One level   Prior Function   Level of [place] with basic ADLs   Observation/Other Assessments   Observations R upper gluteal palpable tenderness   Other Surveys  Select   Posture/Postural Control   Posture/Postural Control Postural limitations   Postural Limitations Rounded Shoulders;Forward head;Decreased lumbar lordosis;Increased thoracic kyphosis;Flexed trunk   AROM   Lumbar Flexion 76   Lumbar Extension 20   Lumbar - Right  Side Bend 10   Lumbar - Left Side Bend 15   Special Tests    Special Tests Lumbar   other   Findings Positive   Side Right   Comment Foraminal closure          [facility] Adult PT Treatment/Exercise - 09/01/14 0944    Exercises   Exercises Lumbar   Lumbar Exercises: Stretches   Single Knee to Chest Stretch 3 reps;30 seconds   Single Knee to Chest Stretch Limitations HEP   Double Knee to Chest Stretch 3 reps;30 seconds   Double Knee to Chest Stretch Limitations HEP   Lumbar Exercises: Supine   Ab Set 5 reps;5 seconds;Other (comment)   AB Set Limitations progressed to sitting and standing with postural education   Other Supine Lumbar Exercises self myofascial release with tennis ball          PT Education - 09/01/14 1005    Education provided Yes   Education Details HEP, PT POC   Person(s) Educated Patient   Methods Explanation;Demonstration   Comprehension Verbalized understanding          PT Short Term Goals - 09/01/14 1328    PT SHORT TERM GOAL #1   Title "Independent with initial HEP   Time 2   Period Weeks   Status New   PT SHORT TERM GOAL #2   Title "Demonstrate understanding of proper posture and be more conscious of position and posture throughout the day.    Time 2   Period Weeks   Status New          PT Long Term Goals - 09/01/14 1759    PT LONG TERM GOAL #1   Title "Pain will decrease to 0/10 with all functional activities   Time 6   Period Weeks   Status New   PT LONG TERM GOAL #2   Title "Pt will tolerate standing and walking for 2 hours without increased pain in order to return to PLOF    Time 6   Period Weeks   Status New   PT LONG TERM GOAL #3   Title "Pt will be independent with advanced HEP.    Time 2   Period Weeks   Status New          Plan - 09/01/14 1326    Clinical Impression Statement Pt presents with signs and symptoms compatible with lumbar stenosis and R upper glute strain/tenderness. Pt would benefit from skilled PT  for 2 times a week for 6 weeks to address impariments and functional limitations and return to pain-free PLOF.   Pt will benefit from skilled therapeutic intervention in order to improve on the following deficits Abnormal  gait;Difficulty walking;Impaired flexibility;Improper body mechanics;Decreased range of motion;Decreased mobility;Decreased strength;Postural dysfunction;Decreased activity tolerance;Pain  Palpable tenderness at R upper glute   Rehab Potential Excellent   PT Frequency 2x / week   PT Duration 6 weeks   PT Treatment/Interventions ADLs/Self Care Home Management;Patient/family education;Therapeutic exercise;Therapeutic activities;Moist Heat;Traction;Neuromuscular re-education;Gait training;Manual techniques;Cryotherapy   PT Next Visit Plan Flexion based ther ex, upper glute manuals, manual and modalities as needed.    Consulted and Agree with Plan of Care Patient      G-codes only: Clinical Judgement based on documentation Category: Mobility Current: CJ Goal: CI  Romualdo Bolk, PT, DPT 12/30/2014 3:10 PM Phone: [phone] Fax: [phone]   Problem List Patient Active Problem List   Diagnosis Date Noted  . Right lumbar radiculopathy 08/24/2014  . Piriformis syndrome of right side 08/24/2014  . S/P shoulder replacement 12/25/2013  . Migraine headache 12/22/2013  . OA (osteoarthritis) of knee 08/25/2013  . HYPERLIPIDEMIA 07/29/2007  . NEPHROLITHIASIS, HX OF 07/29/2007  . HYPERCHOLESTEROLEMIA 04/22/2007  . ALLERGIC RHINITIS 04/22/2007  . Osteoarth NOS-Unspec 04/22/2007   Dollene Cleveland, PT, DPT 09/01/2014 6:02 PM Phone: [phone] Fax: [phone]

## 2014-09-01 NOTE — Patient Instructions (Signed)
Double Knee to Chest (Flexion)   Gently pull both knees toward chest. Feel stretch in lower back or buttock area. Breathing deeply, Hold _30___ seconds. Repeat __3__ times. Do ___3_ sessions per day.  Knee to Chest (Flexion)   Pull knee toward chest. Feel stretch in lower back or buttock area. Breathing deeply, Hold _30___ seconds. Repeat with other knee. Repeat __3__ times. Do __3__ sessions per day.  USE tennis ball on sore spot to help trigger points to release, slowly.  2-3 minutes, 2-3 times a day.

## 2014-09-03 ENCOUNTER — Encounter: Payer: Managed Care, Other (non HMO) | Admitting: Physical Therapy

## 2014-09-03 NOTE — Therapy (Signed)
Outpatient Rehabilitation [facility] [address] [place], [place], [address] Phone: [phone]   Fax:  [phone]  Patient Details  Name: [name] [name] MRN: [id number] Date of Birth: [birth year]  Encounter Date: 09/03/2014  PHYSICAL THERAPY DISCHARGE SUMMARY  Visits from Start of Care: 1   Pt attended evaluation for low back pain, but does not plan to return because he cannot afford the co-pay. Therefore, pt will be discharged from PT at this time, with goals unmet.   Current functional level related to goals / functional outcomes: PT SHORT TERM GOAL #1     Title  "Independent with initial HEP    Time  2    Period  Weeks    Status  New    PT SHORT TERM GOAL #2    Title  "Demonstrate understanding of proper posture and be more conscious of position and posture throughout the day.     Time  2    Period  Weeks    Status  New        09/01/14 1759    PT LONG TERM GOAL #1    Title  "Pain will decrease to 0/10 with all functional activities    Time  6    Period  Weeks    Status  New    PT LONG TERM GOAL #2    Title  "Pt will tolerate standing and walking for 2 hours without increased pain in order to return to PLOF     Time  6    Period  Weeks    Status  New    PT LONG TERM GOAL #3    Title  "Pt will be independent with advanced HEP.     Time  2    Period  Weeks    Status  New       Remaining deficits: Back pain    Education / Equipment: HEP  Plan: Patient agrees to discharge.  Patient goals were not met. Patient is being discharged due to financial reasons.  ?????        Dollene Cleveland, PT, DPT 09/03/2014 5:48 PM Phone: [phone] Fax: [phone]

## 2014-09-14 ENCOUNTER — Encounter: Payer: Managed Care, Other (non HMO) | Admitting: Physical Therapy

## 2014-09-28 ENCOUNTER — Ambulatory Visit (INDEPENDENT_AMBULATORY_CARE_PROVIDER_SITE_OTHER): Payer: Medicare Other | Admitting: Family Medicine

## 2014-09-28 ENCOUNTER — Encounter: Payer: Self-pay | Admitting: Family Medicine

## 2014-09-28 ENCOUNTER — Other Ambulatory Visit: Payer: Self-pay | Admitting: Internal Medicine

## 2014-09-28 VITALS — BP 127/82 | HR 65 | Ht 69.0 in | Wt 198.0 lb

## 2014-09-28 DIAGNOSIS — Z96611 Presence of right artificial shoulder joint: Secondary | ICD-10-CM

## 2014-09-28 DIAGNOSIS — G5701 Lesion of sciatic nerve, right lower limb: Secondary | ICD-10-CM

## 2014-09-28 NOTE — Patient Instructions (Signed)
Good to see you Tylenol 500mg  3 times a day Stop pravastatin for 1 week and see if you feel better OK to take tramadol with tylenol Try the pennsaid pinky amount on shoulder and pirformis.  Keep doing the wall exercise Focus on core but think of planks, 5 sets of 30 seconds daily or most days of the week.  I think 1 more visit for PT and ask them to give you a home regimen.  Continue the nutrition.  Keep up with supple he knows what he is doing.  We may need to consider another epidural.  See me again in 4-6 weeks.

## 2014-09-28 NOTE — Assessment & Plan Note (Signed)
Patient is seen an orthopedic surgeon and will be followed by them.

## 2014-09-28 NOTE — Assessment & Plan Note (Signed)
Patient given home exercises and we discussed the importance of the hip abductor strengthening as well as core strengthening. Patient will do a trial off of his cholesterol medication to see if this is causing some pain. We discussed other over-the-counter medications including Tylenol on a scheduled protocol and could be helpful. Patient will be continuing on his pain medication that is prescribed by other providers. Discussed with patient that likely some of his pain is secondary to the back pain and radicular symptoms. Patient may need another epidural steroid injection but he would like to avoid this as well as avoid surgery at all costs. Patient is taking Lyrica 4 times daily. Patient will try different changes and come back in 4-6 weeks for further evaluation.

## 2014-09-28 NOTE — Progress Notes (Signed)
[facility] [place], [place] [address] Phone: [phone] Subjective:     CC: Low back pain with radicular symptoms.  [id number] [name] [name] is a 64 y.o. male coming in with complaint of low back pain with radicular symptoms. Patient does have significant amount of osteoarthritic changes of multiple joints and has had total replacement of the shoulders well his right knee within the last 10 months. In addition of this patient is taking clotted, tramadol and some over-the-counter medications for pain relief including Lyrica. Patient was seen by neurology recently for a follow-up of numbness and weakness in the lower extremities. Patient has also been seen by [facility] orthopedics and has had a workup for low back pain. Patient at that time was scheduled for another dural steroid injection and did have this and has approximately 1 week of relief after one week after the injection. Patient was told that surgical intervention may be necessary. Patient is wondering what other therapies would be beneficial.  Patient was seen previously and was treated more for a piriformis syndrome. Patient was sent to formal physical therapy to see if this will be more beneficial. We discussed over-the-counter medications and conservative therapy. Patient states she is no some mild improvement. Patient is stating that his shoulder pain is more severe. Patient states he's been doing the exercises and has gone to formal physical therapy 1 time. Patient states that this was helpful but he cannot afford the co-pay. Patient states he is no longer having the radicular symptoms down his leg. Patient states that it is more just an uncomfortable feeling that stops him from walking a proximal only 1-1/2-2 miles within his walking regimen. Patient states that he does his exercises he seems to do somewhat better. Denies any weakness and denies any nighttime awakening.  Patient is  complaining of right shoulder pain. Patient has seen Dr. Onnie Graham, and was given a steroid injection.patient has had a previous shoulder replacement. Patient states that the steroid injection does not seem to be helping and he had this hours previously. Patient was told that he may need to have revision of the shoulder. Patient states that this pain is keeping him up at night.   Patient did bring his most recent MRI. This MRI was reviewed by me and does show that patient had intraluminal and foraminal narrowing on the right side at L4-L5 as well as some on L5-S1. Patient also has a grade 1 spondlisthesis.     Past medical history, social, surgical and family history all reviewed in electronic medical record.   Review of Systems: No headache, visual changes, nausea, vomiting, diarrhea, constipation, dizziness, abdominal pain, skin rash, fevers, chills, night sweats, weight loss, swollen lymph nodes, body aches, joint swelling, muscle aches, chest pain, shortness of breath, mood changes.   Objective Blood pressure 127/82, pulse 65, height 5\' 9"  (1.753 m), weight 198 lb (89.812 kg), SpO2 96 %.  General: No apparent distress alert and oriented x3 mood and affect normal, dressed appropriately.  HEENT: Pupils equal, extraocular movements intact  Respiratory: Patient's speak in full sentences and does not appear short of breath  Cardiovascular: No lower extremity edema, non tender, no erythema  Skin: Warm dry intact with no signs of infection or rash on extremities or on axial skeleton.  Abdomen: Soft nontender  Neuro: Cranial nerves II through XII are intact, neurovascularly intact in all extremities with 2+ DTRs and 2+ pulses.  Lymph: No lymphadenopathy of posterior or  anterior cervical chain or axillae bilaterally.  Gait normal with good balance and coordination.  MSK:  Non tender with full range of motion and good stability and symmetric strength and tone with bilateral shoulder replacements and  right-sided knee replacement.   Back Exam:  Inspection: Unremarkable  Motion: Flexion 35 deg, Extension 45 deg, Side Bending to 45 deg bilaterally,  Rotation to 45 deg bilaterally  SLR laying: Negative  XSLR laying: Negative  Palpable tenderness: Mild tenderness in the paraspinal musculature on the right side as well as the right SI joint but improved from previous exam [name]: Positive right but no radicular symptoms Sensory change: Gross sensation intact to all lumbar and sacral dermatomes.  Reflexes: 2+ at both patellar tendons, 2+ at achilles tendons, Babinski's downgoing.  Strength at foot  Plantar-flexion: 5/5 Dorsi-flexion: 5/5 Eversion: 5/5 Inversion: 5/5  Leg strength  Quad: 5/5 Hamstring: 5/5 Hip flexor: 5/5 Hip abductors: 4/5 minor improvement from previous exam  Gait unremarkable.     Impression and Recommendations:     This case required medical decision making of moderate complexity.

## 2014-09-29 ENCOUNTER — Other Ambulatory Visit: Payer: Self-pay

## 2014-09-29 ENCOUNTER — Other Ambulatory Visit: Payer: Self-pay | Admitting: Internal Medicine

## 2014-09-29 MED ORDER — SUMATRIPTAN SUCCINATE 100 MG PO TABS: 100.0000 mg | ORAL_TABLET | ORAL | Status: DC | PRN

## 2014-09-29 MED ORDER — ALLOPURINOL 100 MG PO TABS: 100.0000 mg | ORAL_TABLET | Freq: Every day | ORAL | Status: DC

## 2014-09-29 NOTE — Telephone Encounter (Signed)
Rx for Imitrex sent

## 2014-09-29 NOTE — Telephone Encounter (Signed)
Rx request for allopurinol 100 mg tablet- Take 1 tablet by mouth once daily #90  Rx sent to pharmacy.

## 2014-09-29 NOTE — Addendum Note (Signed)
Addended by: Colleen Can on: 09/29/2014 02:27 PM   Modules accepted: Orders

## 2014-09-30 ENCOUNTER — Ambulatory Visit: Payer: Medicare Other | Attending: Orthopedic Surgery | Admitting: Physical Therapy

## 2014-09-30 ENCOUNTER — Ambulatory Visit: Payer: Managed Care, Other (non HMO) | Admitting: Physical Therapy

## 2014-09-30 ENCOUNTER — Encounter: Payer: Self-pay | Admitting: Family Medicine

## 2014-09-30 DIAGNOSIS — G5701 Lesion of sciatic nerve, right lower limb: Secondary | ICD-10-CM | POA: Insufficient documentation

## 2014-09-30 DIAGNOSIS — M5416 Radiculopathy, lumbar region: Secondary | ICD-10-CM | POA: Diagnosis not present

## 2014-09-30 NOTE — Telephone Encounter (Signed)
°   °   °   °  °  This is the massage from scheduling area that pt sent  I did not know how to send a message on your system. I already have an appointment with DR Tamala Julian. See below. Dr. Tamala Julian told me to write if i had any concerns. Please let me know what i should do to correct my problem.  The cortisone shots seem to have helped by maybe 10% so far and i am pushing the limit to stretch it more, though still extraordinarily painful. I am taking all of the medicine as prescribed. My mobility is still severely limited in that shoulder  only slept two hours the first night and i was up at 2 this morning. I actually thought i would be knocked out by the medicine. WORST OF ALL, I HAVE THE WORST CASE OF PERSISTENT HICCUPS (OVER 6 HOURS OF LOUD, UNCONTROLLABLE HICCUPS YESTERDAY) THAT SEEM TO BE A REACTION TO THE SHOTS????? There were articles on the internet documenting this same reaction after cortisone shots, though i never experienced it before. WHAT CAN I DO TO MAKE THEM GO AWAY????

## 2014-10-08 ENCOUNTER — Ambulatory Visit: Payer: Medicare Other | Admitting: Physical Therapy

## 2014-10-08 DIAGNOSIS — G5701 Lesion of sciatic nerve, right lower limb: Secondary | ICD-10-CM | POA: Diagnosis not present

## 2014-10-22 ENCOUNTER — Ambulatory Visit: Payer: Medicare Other | Admitting: Physical Therapy

## 2014-10-22 DIAGNOSIS — G5701 Lesion of sciatic nerve, right lower limb: Secondary | ICD-10-CM | POA: Diagnosis not present

## 2014-10-29 ENCOUNTER — Ambulatory Visit: Payer: Medicare Other | Attending: Orthopedic Surgery | Admitting: Physical Therapy

## 2014-10-29 DIAGNOSIS — M25511 Pain in right shoulder: Secondary | ICD-10-CM | POA: Insufficient documentation

## 2014-10-29 DIAGNOSIS — Z471 Aftercare following joint replacement surgery: Secondary | ICD-10-CM | POA: Insufficient documentation

## 2014-10-29 DIAGNOSIS — M25611 Stiffness of right shoulder, not elsewhere classified: Secondary | ICD-10-CM | POA: Diagnosis not present

## 2014-10-29 DIAGNOSIS — M6281 Muscle weakness (generalized): Secondary | ICD-10-CM | POA: Diagnosis not present

## 2014-11-02 ENCOUNTER — Telehealth: Payer: Self-pay | Admitting: *Deleted

## 2014-11-02 ENCOUNTER — Ambulatory Visit: Payer: Medicare Other | Admitting: Family Medicine

## 2014-11-02 NOTE — Telephone Encounter (Signed)
[facility] - Client TELEPHONE ADVICE RECORD [facility] Patient Name: [name] [name] Gender: Male DOB: Feb 21, 1951 Age: 63 Y 1 M 17 D Return Phone Number: [phone] (Primary) Address: City/State/Zip: [place] [address] [facility] Night - Client Client Site [place] - Night Physician [place], [place] Type Call [place] Name [place] Phone Number [phone] Relationship To Patient Self Is this call to report lab results? No Call Type General Information Initial Comment Caller states he is not feeling well and he needs to cancel appt tomorrow on 2/8 at 10:30 a.m. He will call back to reschedule. General Information Type Message Only Nurse Assessment Guidelines Guideline Title Affirmed Question Affirmed Notes Nurse Date/Time (Eastern Time) Disp. Time Eilene Ghazi Time) Disposition Final User 11/01/2014 3:47:33 PM General Information Provided Yes Laney Pastor After Care Instructions Given Call Event Type User Date / Time Description

## 2014-11-10 ENCOUNTER — Ambulatory Visit: Payer: Medicare Other | Admitting: Physical Therapy

## 2014-11-18 ENCOUNTER — Encounter: Payer: Self-pay | Admitting: Physical Therapy

## 2014-11-18 ENCOUNTER — Ambulatory Visit: Payer: Medicare Other | Admitting: Physical Therapy

## 2014-11-18 DIAGNOSIS — M25511 Pain in right shoulder: Secondary | ICD-10-CM

## 2014-11-18 DIAGNOSIS — Z471 Aftercare following joint replacement surgery: Secondary | ICD-10-CM | POA: Diagnosis not present

## 2014-11-18 NOTE — Therapy (Addendum)
[facility] [address], [place], [place], [address] Phone: [phone]   Fax:  [phone]  Physical Therapy Treatment  Patient Details  Name: [name] [name] MRN: [id number] Date of Birth: 1950/11/25 Referring Provider:  Marletta Lor, MD  Encounter Date: 11/18/2014      PT End of Session - 11/18/14 1049    Visit Number 22   Date for PT Re-Evaluation 11/22/14   PT Start Time 0930   PT Stop Time 1025   PT Time Calculation (min) 55 min   Activity Tolerance Patient tolerated treatment well   Behavior During Therapy [facility] for tasks assessed/performed      Past Medical History  Diagnosis Date  . ALLERGIC RHINITIS 04/22/2007  . HYPERCHOLESTEROLEMIA 04/22/2007  . HYPERLIPIDEMIA 07/29/2007  . NEPHROLITHIASIS, HX OF 07/29/2007  . Osteoarth NOS-Unspec 04/22/2007  . Headache(784.0)     migraines  . DDD (degenerative disc disease), cervical   . Shoulder pain, acute     bilateral  . History of kidney stones   . History of skin cancer   . GERD (gastroesophageal reflux disease)     infrequently  . History of TMJ disorder   . Complication of anesthesia     more concerned about positioning of head & neck because of TMJ  . Cancer     HX SKIN CANCER    Past Surgical History  Procedure Laterality Date  . Appendectomy    . Knee arthroscopy      right x2  . Shoulder surgery      right  . Total knee arthroplasty Right 08/25/2013    Procedure: RIGHT TOTAL KNEE ARTHROPLASTY;  Surgeon: Gearlean Alf, MD;  Location: [facility];  Service: Orthopedics;  Laterality: Right;  . Joint replacement    . Total shoulder arthroplasty Left 12/25/2013    DR SUPPLE   . Total shoulder arthroplasty Left 12/25/2013    Procedure: LEFT TOTAL SHOULDER ARTHROPLASTY;  Surgeon: Marin Shutter, MD;  Location: [facility];  Service: Orthopedics;  Laterality: Left;  . Total shoulder arthroplasty Right 06/18/2014    DR SUPPLE  . Total shoulder  arthroplasty Right 06/18/2014    Procedure: RIGHT TOTAL SHOULDER ARTHROPLASTY;  Surgeon: Marin Shutter, MD;  Location: [facility];  Service: Orthopedics;  Laterality: Right;    There were no vitals taken for this visit.  Visit Diagnosis:  Pain in joint, shoulder region, right      Subjective Assessment - 11/18/14 0939    Symptoms R shoulder painful with extension & abduction   Pertinent History Bil TKA   Limitations House hold activities   Patient Stated Goals incr [name] in Rt shoulder, no pain with reaching    Pain Score 2    Pain Location Shoulder   Pain Orientation Right   Pain Descriptors / Indicators Crushing;Aching   Pain Type Surgical pain   Aggravating Factors  reaching extension and abduction with Rt UE   Multiple Pain Sites No          [facility] PT Assessment - 11/18/14 0001    AROM   AROM Assessment Site Shoulder   Right/Left Shoulder Right   Right Shoulder Flexion 145 Degrees   Right Shoulder ABduction 145 Degrees                  [facility] Adult PT Treatment/Exercise - 11/18/14 0001    Exercises   Exercises Shoulder;Elbow   Elbow Exercises   Bar Weights/Barbell (Elbow Extension) Other (  comment)   Elbow Extension Limitations 25# bil on lattissimus pull down 3 x 10   Shoulder Exercises: Supine   Other Supine Exercises 4# x 10 & 6# 2x10 bil   Shoulder Exercises: Seated   Retraction Weight (lbs) 6# bil 3 x 10   Shoulder Exercises: Prone   Extension Weight (lbs) 4# Rt 3 x 10   Horizontal ABduction 1 Other (comment)  6# bil scapular retraction   Shoulder Exercises: Standing   Flexion Limitations doorframe stretch   2 x10, bil standing on 2' box   Extension Weight (lbs) 30# standing row   Row Weight (lbs) 30# bil extesion, rows 35 # 3 x 10 each   Shoulder Exercises: Pulleys   Flexion 3 minutes   ABduction 3 minutes   Other Pulley Exercises IR 2'   Shoulder Exercises: [name]/Strengthening   Rebounder 8' level 5(4/4)   Shoulder Exercises: Stretch   Internal  Rotation Stretch 3 reps   External Rotation Stretch 3 reps   Manual Therapy   Manual Therapy Other (comment)                PT Education - 11/18/14 1048    Education provided Yes   Education Details Patient advised to respect limitations due to shoulder replacement and be careful with his home exercises   Person(s) Educated Patient   Methods Explanation   Comprehension Verbalized understanding          PT Short Term Goals - 11/18/14 1057    PT SHORT TERM GOAL #1   Title See long term goals           PT Long Term Goals - 11/18/14 1059    PT LONG TERM GOAL #1   Title demonstrate and/or verbalize techniques to reduce the risk of re-injury to include info on: anti-inflammatory (RICE method)   Time 8   Period Weeks   Status Achieved   PT LONG TERM GOAL #2   Title be independent with advanced HEP   Time 8   Period Weeks   Status Achieved   PT LONG TERM GOAL #3   Title reduce FOTO to< or = to 44% limitation   Time 8   Status Achieved  35%   PT LONG TERM GOAL #4   Title report  . or = to 60% of Rt UE with ADL's and self-care   Time 8   Period Weeks   Status Achieved   PT LONG TERM GOAL #5   Title demonstrate Rt shoulder AROM flexion to 120 degrees to improve overhead reaching   Time 8   Period Weeks   Status Achieved   PT LONG TERM GOAL #6   Title demonstrate 3/5 to 3+/5 Rt shoulder strength to improve use with ADL's and self-care   Time 8   Period Weeks   Status Achieved   PT LONG TERM GOAL #7   Title report < or = to 3/10 Rt shoulder pain with use   Time 8   Period Weeks   Status Achieved               Plan - 11/18/14 1054    Clinical Impression Statement Pt is D/C to HEP   Rehab Potential Excellent   PT Next Visit Plan D/C   Consulted and Agree with Plan of Care Patient        Problem List Patient Active Problem List   Diagnosis Date Noted  . Right lumbar radiculopathy 08/24/2014  . Piriformis  syndrome of right side 08/24/2014   . S/P shoulder replacement 12/25/2013  . Migraine headache 12/22/2013  . OA (osteoarthritis) of knee 08/25/2013  . HYPERLIPIDEMIA 07/29/2007  . NEPHROLITHIASIS, HX OF 07/29/2007  . HYPERCHOLESTEROLEMIA 04/22/2007  . ALLERGIC RHINITIS 04/22/2007  . Osteoarth NOS-Unspec 04/22/2007  PHYSICAL THERAPY DISCHARGE SUMMARY  Visits from Start of Care: 22  Current functional level related to goals / functional outcomes:  See above for goal assessment.     Remaining deficits: Pt with continued Rt shoulder pain and weakness.  Pt has HEP in place to address remaining deficits.  Thank you for this referral.    Education / Equipment: HEP Plan: Patient agrees to discharge.  Patient goals were met. Patient is being discharged due to meeting the stated rehab goals.  ?????     Sigurd Sos, PT  11/18/2014, 12:20 PM  [facility] [address] [place], [place], [address] Phone: [phone]   Fax:  [phone]

## 2014-11-25 ENCOUNTER — Ambulatory Visit: Payer: Medicare Other | Admitting: Physical Therapy

## 2015-02-10 ENCOUNTER — Telehealth: Payer: Self-pay

## 2015-02-10 NOTE — Telephone Encounter (Signed)
[facility] [place], [place] - [address]: SUMAtriptan (IMITREX) 100 MG tablet

## 2015-02-11 MED ORDER — SUMATRIPTAN SUCCINATE 100 MG PO TABS: 100.0000 mg | ORAL_TABLET | ORAL | Status: DC | PRN

## 2015-02-11 NOTE — Telephone Encounter (Signed)
Rx faxed to pharmacy  

## 2015-03-25 ENCOUNTER — Other Ambulatory Visit: Payer: Self-pay | Admitting: Specialist

## 2015-03-25 DIAGNOSIS — M48061 Spinal stenosis, lumbar region without neurogenic claudication: Secondary | ICD-10-CM

## 2015-04-05 ENCOUNTER — Other Ambulatory Visit: Payer: Medicare Other

## 2015-04-09 ENCOUNTER — Ambulatory Visit
Admission: RE | Admit: 2015-04-09 | Discharge: 2015-04-09 | Disposition: A | Discharge status: Home / Self Care | Payer: Medicare Other | Source: Ambulatory Visit | Attending: Specialist | Admitting: Specialist

## 2015-04-09 DIAGNOSIS — M48061 Spinal stenosis, lumbar region without neurogenic claudication: Secondary | ICD-10-CM

## 2015-04-09 MED ORDER — IOHEXOL 180 MG/ML  SOLN
1.0000 mL | Freq: Once | INTRAMUSCULAR | Status: AC | PRN
  Administered 2015-04-09: 1 mL via EPIDURAL

## 2015-04-09 MED ORDER — METHYLPREDNISOLONE ACETATE 40 MG/ML INJ SUSP (RADIOLOG
120.0000 mg | Freq: Once | INTRAMUSCULAR | Status: AC
  Administered 2015-04-09: 120 mg via EPIDURAL

## 2015-04-09 NOTE — Discharge Instructions (Signed)

## 2015-04-20 ENCOUNTER — Encounter: Payer: Self-pay | Admitting: Internal Medicine

## 2015-06-16 ENCOUNTER — Ambulatory Visit (INDEPENDENT_AMBULATORY_CARE_PROVIDER_SITE_OTHER): Payer: Medicare Other | Admitting: *Deleted

## 2015-06-16 DIAGNOSIS — Z23 Encounter for immunization: Secondary | ICD-10-CM

## 2015-07-01 ENCOUNTER — Other Ambulatory Visit: Payer: Self-pay | Admitting: Neurosurgery

## 2015-07-01 DIAGNOSIS — M4317 Spondylolisthesis, lumbosacral region: Secondary | ICD-10-CM

## 2015-07-02 NOTE — Progress Notes (Signed)
13-hour prep called in to [facility] [phone]).  Patient notified this was done and to take Prednisone 50mg  PO Sunday, 07/11/15 at 22:00, Monday, 07/12/15 at 04:00 and Monday, 07/12/15 at 10:00.  He knows to take Benadryl 50mg  PO (dye-free due to red dye allergy) at 10:00 on Monday with final dose of Prednisone.  Patient states he already has some dye-free Benadryl from last 13-hour prep.  Brita Romp, RN

## 2015-07-12 ENCOUNTER — Ambulatory Visit
Admission: RE | Admit: 2015-07-12 | Discharge: 2015-07-12 | Disposition: A | Discharge status: Home / Self Care | Payer: Medicare Other | Source: Ambulatory Visit | Attending: Neurosurgery | Admitting: Neurosurgery

## 2015-07-12 DIAGNOSIS — M4317 Spondylolisthesis, lumbosacral region: Secondary | ICD-10-CM

## 2015-07-12 MED ORDER — IOHEXOL 180 MG/ML  SOLN
18.0000 mL | Freq: Once | INTRAMUSCULAR | Status: DC | PRN
  Administered 2015-07-12: 18 mL via INTRATHECAL

## 2015-07-12 NOTE — Progress Notes (Signed)
Pt states he has been off Sumatriptan for the past 2 days.  Discharge instructions explained to pt.

## 2015-07-12 NOTE — Discharge Instructions (Signed)
Myelogram Discharge Instructions  1. Go home and rest quietly for the next 24 hours.  It is important to lie flat for the next 24 hours.  Get up only to go to the restroom.  You may lie in the bed or on a couch on your back, your stomach, your left side or your right side.  You may have one pillow under your head.  You may have pillows between your knees while you are on your side or under your knees while you are on your back.  2. DO NOT drive today.  Recline the seat as far back as it will go, while still wearing your seat belt, on the way home.  3. You may get up to go to the bathroom as needed.  You may sit up for 10 minutes to eat.  You may resume your normal diet and medications unless otherwise indicated.  Drink lots of extra fluids today and tomorrow.  4. The incidence of headache, nausea, or vomiting is about 5% (one in 20 patients).  If you develop a headache, lie flat and drink plenty of fluids until the headache goes away.  Caffeinated beverages may be helpful.  If you develop severe nausea and vomiting or a headache that does not go away with flat bed rest, call [phone].  5. You may resume normal activities after your 24 hours of bed rest is over; however, do not exert yourself strongly or do any heavy lifting tomorrow. If when you get up you have a headache when standing, go back to bed and force fluids for another 24 hours.  6. Call your physician for a follow-up appointment.  The results of your myelogram will be sent directly to your physician by the following day.  7. If you have any questions or if complications develop after you arrive home, please call [phone].  Discharge instructions have been explained to the patient.  The patient, or the person responsible for the patient, fully understands these instructions.      May resume Sumatriptan on Oct. 18, 2016, after 11:00 am.

## 2015-08-23 ENCOUNTER — Ambulatory Visit: Payer: Medicare Other | Attending: Specialist

## 2015-08-23 DIAGNOSIS — M25511 Pain in right shoulder: Secondary | ICD-10-CM | POA: Insufficient documentation

## 2015-08-23 DIAGNOSIS — M5441 Lumbago with sciatica, right side: Secondary | ICD-10-CM | POA: Insufficient documentation

## 2015-08-23 DIAGNOSIS — M545 Low back pain: Secondary | ICD-10-CM | POA: Diagnosis present

## 2015-08-23 NOTE — Therapy (Signed)
[facility] [address] [place], [place], [address] Phone: [phone]   Fax:  [phone]  Physical Therapy Evaluation  Patient Details  Name: KEYLOR BONIFIELD MRN: [id number] Date of Birth: [birth year] Referring Provider: Susa Day, MD  Encounter Date: 08/23/2015      PT End of Session - 08/23/15 1219    Visit Number 1   Number of Visits 10   Date for PT Re-Evaluation 10/18/15   PT Start Time [id number]   PT Stop Time 1220   PT Time Calculation (min) 35 min   Activity Tolerance Patient tolerated treatment well   Behavior During Therapy [facility] for tasks assessed/performed      Past Medical History  Diagnosis Date  . ALLERGIC RHINITIS 04/22/2007  . HYPERCHOLESTEROLEMIA 04/22/2007  . HYPERLIPIDEMIA 07/29/2007  . NEPHROLITHIASIS, HX OF 07/29/2007  . Osteoarth NOS-Unspec 04/22/2007  . Headache(784.0)     migraines  . DDD (degenerative disc disease), cervical   . Shoulder pain, acute     bilateral  . History of kidney stones   . History of skin cancer   . GERD (gastroesophageal reflux disease)     infrequently  . History of TMJ disorder   . Complication of anesthesia     more concerned about positioning of head & neck because of TMJ  . Cancer [facility])     HX SKIN CANCER    Past Surgical History  Procedure Laterality Date  . Appendectomy    . Knee arthroscopy      right x2  . Shoulder surgery      right  . Total knee arthroplasty Right 08/25/2013    Procedure: RIGHT TOTAL KNEE ARTHROPLASTY;  Surgeon: Gearlean Alf, MD;  Location: [facility];  Service: Orthopedics;  Laterality: Right;  . Joint replacement    . Total shoulder arthroplasty Left 12/25/2013    DR SUPPLE   . Total shoulder arthroplasty Left 12/25/2013    Procedure: LEFT TOTAL SHOULDER ARTHROPLASTY;  Surgeon: Marin Shutter, MD;  Location: [place];  Service: Orthopedics;  Laterality: Left;  . Total shoulder arthroplasty Right 06/18/2014    DR SUPPLE  .  Total shoulder arthroplasty Right 06/18/2014    Procedure: RIGHT TOTAL SHOULDER ARTHROPLASTY;  Surgeon: Marin Shutter, MD;  Location: [place];  Service: Orthopedics;  Laterality: Right;    There were no vitals filed for this visit.  Visit Diagnosis:  Right-sided low back pain with right-sided sciatica - Plan: PT plan of care cert/re-cert, PT plan of care cert/re-cert      Subjective Assessment - 08/23/15 1145    Subjective Pt presents to PT with chronic LBP and radiculopathy.  Pt has recent imaging that showed significant stenosis, degeneration and anteriolisthesis in the lumbar spine.  Dr Tonita Cong didn't want to operate due to complexity and referred to local neurosurgeon.  This MD also didn't want to operate and he has been referred to Dr Harl Bowie at [facility] for consultation.  Pt would like to do "anything he can" to help his situation before surgery.  He is not able to stand, walk or exercise anymore.       Pertinent History Rt total knee replacement, bilateral shoulder replacmements.     Limitations Standing;Walking   How long can you stand comfortably? 2 minutes   How long can you walk comfortably? 1/2 mile= 10 minutes   Diagnostic tests see attached imaging: lumbar anteriolisthesis, stenosis, degeneration, osteophytes   Patient Stated Goals try to  improve situation of lumbar spine before possible surgery, reduce Rt LE pain to allow for standing longer, exercise   Currently in Pain? Yes   Pain Score 10-Worst pain ever  no pain with sitting   Pain Location Buttocks   Pain Orientation Right   Pain Descriptors / Indicators Aching;Burning;Sharp;Stabbing   Pain Type Chronic pain   Pain Onset More than a month ago   Pain Frequency Intermittent   Aggravating Factors  standing >2 minutes, walking   Pain Relieving Factors sitting down, stretching   Effect of Pain on Daily Activities not able to stand to cook, not able to exercise (has gained weight), not able to walk             [facility] PT Assessment - 08/23/15 0001    Assessment   Medical Diagnosis spinal stenosis, lumbar (M48.06), degenerative lumbar disc (M51.36)   Referring Provider Susa Day, MD   Onset Date/Surgical Date 08/22/13   Next MD Visit 09/22/2015   Precautions   Precautions None   Restrictions   Weight Bearing Restrictions No   Balance Screen   Has the patient fallen in the past 6 months No   [place] residence   Living Arrangements Spouse/significant other   Type of [place]   Prior Function   Level of [place] Retired   Leisure pt likes to walk for exercise, not able to do secondary to pain   Cognition   Overall Cognitive Status Within Functional Limits for tasks assessed   Observation/Other Assessments   Focus on Therapeutic Outcomes (FOTO)  53% limitation   Posture/Postural Control   Posture/Postural Control No significant limitations   [name] / Strength   AROM / PROM / Strength AROM;PROM;Strength   AROM   Overall AROM  Within functional limits for tasks performed   Overall AROM Comments Full lumbar AROM without pain   PROM   Overall PROM  Deficits   Overall PROM Comments hip flexibility limited by 25% without pain   Strength   Overall Strength Within functional limits for tasks performed   Overall Strength Comments 4+/5 to 5/5 LE strength Rt=Lt   Palpation   SI assessment  reduced spinal mobility in the lumbar spine without pain.     Palpation comment Pain and tension with palpation in deep Rt gluteals   Ambulation/Gait   Ambulation/Gait Yes   Ambulation/Gait Assistance 7: Independent   Ambulation Distance (Feet) 100 Feet                           PT Education - 08/23/15 1214    Education provided Yes   Education Details HEP: decompression, core strenth   Person(s) Educated Patient   Methods Explanation;Demonstration;Handout   Comprehension Verbalized understanding;Returned  demonstration          PT Short Term Goals - 08/23/15 1226    PT SHORT TERM GOAL #1   Title be independent in initial HEP   Time 4   Period Weeks   Status New   PT SHORT TERM GOAL #2   Title report a 10% reduction in Rt LE pain with standing in the kitchen   Time 4   Period Weeks   Status New           PT Long Term Goals - 08/23/15 1140    PT LONG TERM GOAL #1   Title be independent in advanced HEP  Time 8   Period Weeks   Status New   PT LONG TERM GOAL #2   Title reduce FOTO to < or = to 41% limitation   Time 8   Period Weeks   Status New   PT LONG TERM GOAL #3   Title report a 25% reduction in Rt LE pain with stanidng   Time 8   Period Weeks   Status New   PT LONG TERM GOAL #4   Title understand how to safely progress exercise and core strength exercises after discharge   Time 8   Period Weeks   Status New   PT LONG TERM GOAL #5   Title --               Plan - Aug 28, 2015 1222    Clinical Impression Statement Pt presents to PT with significant Rt gluteal pain/radiculopathy.  Recent MRI indicated significant lumbar degeneration, stenosis and osteophytes.  Pt will meet with specialist at [facility] next month and wants to attend PT to do anything that he can to help his situation.  Pt is not able to stand more than 3 minutes in the kitchen, walking is limited to 10 minutes max and pt is not able to exercise due to pain.  FOTO score is 53% limitation.  Pt will benefit from PT to receive body mechanics education, core strength advancement and modalities as needed.   Pt will benefit from skilled therapeutic intervention in order to improve on the following deficits Pain;Decreased activity tolerance;Improper body mechanics;Decreased endurance   Rehab Potential Good   PT Frequency 3x / week   PT Duration 8 weeks   PT Treatment/Interventions ADLs/Self Care Home Management;Cryotherapy;Electrical Stimulation;Moist Heat;Therapeutic exercise;Therapeutic  activities;Functional mobility training;Neuromuscular re-education;Patient/family education;Manual techniques;Passive range of motion   PT Next Visit Plan Advance TA exercises to add leg movements, review flexibility exercises that pt is performing at home, decompression, body mechanics education   Consulted and Agree with Plan of Care Patient          G-Codes - 08/28/2015 1140    Functional Assessment Tool Used FOTO: 53% limitation   Functional Limitation Other PT primary   Other PT Primary Current Status [id number]) At least 40 percent but less than 60 percent impaired, limited or restricted   Other PT Primary Goal Status [id number]) At least 40 percent but less than 60 percent impaired, limited or restricted       Problem List Patient Active Problem List   Diagnosis Date Noted  . Right lumbar radiculopathy 08/24/2014  . Piriformis syndrome of right side 08/24/2014  . S/P shoulder replacement 12/25/2013  . Migraine headache 12/22/2013  . OA (osteoarthritis) of knee 08/25/2013  . HYPERLIPIDEMIA 07/29/2007  . NEPHROLITHIASIS, HX OF 07/29/2007  . HYPERCHOLESTEROLEMIA 04/22/2007  . ALLERGIC RHINITIS 04/22/2007  . Osteoarthrosis, unspecified whether generalized or localized, unspecified site 04/22/2007    Tayra Dawe, PT 2015/08/28, 12:30 PM  [facility] [address] [place], [place], [address] Phone: [phone]   Fax:  [phone]  Name: BANKSTON MUZYKA MRN: [id number] Date of Birth: 16-Nov-1950

## 2015-08-23 NOTE — Patient Instructions (Signed)
Lower abdominal/core stability exercises  1. Practice your breathing technique: Inhale through your nose expanding your belly and rib cage. Try not to breathe into your chest. Exhale slowly and gradually out your mouth feeling a sense of softness to your body. Practice multiple times. This can be performed unlimited.  2. Finding the lower abdominals. Laying on your back with the knees bent, place your fingers just below your belly button. Using your breathing technique from above, on your exhale gently pull the belly button away from your fingertips without tensing any other muscles. Practice this 5x. Next, as you exhale, draw belly button inwards and hold onto it...then feel as if you are pulling that muscle across your pelvis like you are tightening a belt. This can be hard to do at first so be patient and practice. Do 5-10 reps 1-3 x day. Always recognize quality over quantity; if your abdominal muscles become tired you will notice you may tighten/contract other muscles. This is the time to take a break.   Practice this first laying on your back, then in sitting, progressing to standing and finally adding it to all your daily movements.   Decompression Exercise: Basic   Lie on back on firm surface, knees bent, feet flat, arms turned up, out to sides, backs of hands down. Time 3-5___ minutes. Surface: floor   [facility] [address] [place], [place] [address] Phone # [phone] Fax [phone]

## 2015-08-25 ENCOUNTER — Encounter: Payer: Self-pay | Admitting: Physical Therapy

## 2015-08-25 ENCOUNTER — Ambulatory Visit: Payer: Medicare Other | Admitting: Physical Therapy

## 2015-08-25 DIAGNOSIS — M25511 Pain in right shoulder: Secondary | ICD-10-CM

## 2015-08-25 DIAGNOSIS — M5441 Lumbago with sciatica, right side: Secondary | ICD-10-CM | POA: Diagnosis not present

## 2015-08-25 DIAGNOSIS — M545 Low back pain: Secondary | ICD-10-CM

## 2015-08-25 NOTE — Patient Instructions (Addendum)
Lower abdominal/core stability exercises  1. Practice your breathing technique: Inhale through your nose expanding your belly and rib cage. Try not to breathe into your chest. Exhale slowly and gradually out your mouth feeling a sense of softness to your body. Practice multiple times. This can be performed unlimited.  2. Finding the lower abdominals. Laying on your back with the knees bent, place your fingers just below your belly button. Using your breathing technique from above, on your exhale gently pull the belly button away from your fingertips without tensing any other muscles. Practice this 5x. Next, as you exhale, draw belly button inwards and hold onto it...then feel as if you are pulling that muscle across your pelvis like you are tightening a belt. This can be hard to do at first so be patient and practice. Do 5-10 reps 1-3 x day. Always recognize quality over quantity; if your abdominal muscles become tired you will notice you may tighten/contract other muscles. This is the time to take a break.   Practice this first laying on your back, then in sitting, progressing to standing and finally adding it to all your daily movements.   3. Finding your pelvic floor. Using the breathing technique above, when your exhale, this time draw your pelvic floor muscles up as if you were attempting to stop the flow of urination. Be careful NOT to tense any other muscles. This can be hard, BE PATIENT. Try to hold up to 10 seconds repeating 10x. Try 2x a day. Once you feel you are doing this well, add this contraction to exercise #2. First contracting your pelvic floor followed by lower abdominals.  4. Adding leg movements. Add the following leg movements to challenge your ability to keep your core stable:  1. Single leg drop outs: Laying on your back with knees bent feet flat. Inhale,  dropping one knee outward KEEPING YOUR PELVIS STILL. Exhale as you bring the leg back, simultaneously performing your lower  abdominal contraction. Do 5-10 on each leg.  2. Marching: While keeping your pelvis still, lift the right foot a few inches, put it down then lift left foot. This will mimic a march. Start slow to establish control. Once you have control you may speed it up. Do 10-20x. You MUST keep your lower abdominlas contracted while you march. Breathe naturally   3. Single leg slides: Inhale while you slowly slide one leg out keeping your pelvis still. Only slide your leg as far as you can keep your pelvis still. Exhale as you bring the leg back to the start, contracting the lower abdominals as you do that. Keep your upper body relaxed. Do 5-10 on each side. Lower abdominal/core stability exercises  1. Practice your breathing technique: Inhale through your nose expanding your belly and rib cage. Try not to breathe into your chest. Exhale slowly and gradually out your mouth feeling a sense of softness to your body. Practice multiple times. This can be performed unlimited.  2. Finding the lower abdominals. Laying on your back with the knees bent, place your fingers just below your belly button. Using your breathing technique from above, on your exhale gently pull the belly button away from your fingertips without tensing any other muscles. Practice this 5x. Next, as you exhale, draw belly button inwards and hold onto it...then feel as if you are pulling that muscle across your pelvis like you are tightening a belt. This can be hard to do at first so be patient and practice. Do 5-10   5-10 reps 1-3 x day. Always recognize quality over quantity; if your abdominal muscles become tired you will notice you may tighten/contract other muscles. This is the time to take a break.   Practice this first laying on your back, then in sitting, progressing to standing and finally adding it to all your daily movements.   3. Finding your pelvic floor. Using the breathing technique above, when your exhale, this time draw your pelvic floor  muscles up as if you were attempting to stop the flow of urination. Be careful NOT to tense any other muscles. This can be hard, BE PATIENT. Try to hold up to 10 seconds repeating 10x. Try 2x a day. Once you feel you are doing this well, add this contraction to exercise #2. First contracting your pelvic floor followed by lower abdominals.   4. Adding leg movements. Add the following leg movements to challenge your ability to keep your core stable:  1. Single leg drop outs: Laying on your back with knees bent feet flat. Inhale,  dropping one knee outward KEEPING YOUR PELVIS STILL. Exhale as you bring the leg back, simultaneously performing your lower abdominal contraction. Do 5-10 on each leg.   2. Marching: While keeping your pelvis still, lift the right foot a few inches, put it down then lift left foot. This will mimic a march. Start slow to establish control. Once you have control you may speed it up. Do 10-20x. You MUST keep your lower abdominlas contracted while you march. Breathe naturally    3. Single leg slides: Inhale while you slowly slide one leg out keeping your pelvis still. Only slide your leg as far as you can keep your pelvis still. Exhale as you bring the leg back to the start, contracting the lower abdominals as you do that. Keep your upper body relaxed. Do 5-10 on each side.        Oblique Abdominal Side Plank: Lowering (Eccentric)    Lie on side with knees bent and elbow under your shoulder. Engage core first,  Lift bottom waist. Hold 10 sec. Slowly lower trunk for 3-5 seconds. _3  reps per set, _2__ sets per day.  Copyright  VHI. All rights reserved.

## 2015-08-25 NOTE — Therapy (Signed)
[facility] [address], [place], [place], [address] Phone: [phone]   Fax:  [phone]  Physical Therapy Treatment  Patient Details  Name: [name] [name] MRN: [id number] Date of Birth: 1950-10-20 Referring Provider: Susa Day, MD  Encounter Date: 08/25/2015      PT End of Session - 08/25/15 1102    Visit Number 2   Number of Visits 10   Date for PT Re-Evaluation 10/18/15   PT Start Time 1102   PT Stop Time 1143   PT Time Calculation (min) 41 min   Activity Tolerance Patient tolerated treatment well   Behavior During Therapy [facility] for tasks assessed/performed      Past Medical History  Diagnosis Date  . ALLERGIC RHINITIS 04/22/2007  . HYPERCHOLESTEROLEMIA 04/22/2007  . HYPERLIPIDEMIA 07/29/2007  . NEPHROLITHIASIS, HX OF 07/29/2007  . Osteoarth NOS-Unspec 04/22/2007  . Headache(784.0)     migraines  . DDD (degenerative disc disease), cervical   . Shoulder pain, acute     bilateral  . History of kidney stones   . History of skin cancer   . GERD (gastroesophageal reflux disease)     infrequently  . History of TMJ disorder   . Complication of anesthesia     more concerned about positioning of head & neck because of TMJ  . Cancer [facility])     HX SKIN CANCER    Past Surgical History  Procedure Laterality Date  . Appendectomy    . Knee arthroscopy      right x2  . Shoulder surgery      right  . Total knee arthroplasty Right 08/25/2013    Procedure: RIGHT TOTAL KNEE ARTHROPLASTY;  Surgeon: Gearlean Alf, MD;  [facility];  Service: Orthopedics;  Laterality: Right;  . Joint replacement    . Total shoulder arthroplasty Left 12/25/2013    DR SUPPLE   . Total shoulder arthroplasty Left 12/25/2013    Procedure: LEFT TOTAL SHOULDER ARTHROPLASTY;  Surgeon: Marin Shutter, MD;  Location: [place];  Service: Orthopedics;  Laterality: Left;  . Total shoulder arthroplasty Right 06/18/2014    DR SUPPLE  .  Total shoulder arthroplasty Right 06/18/2014    Procedure: RIGHT TOTAL SHOULDER ARTHROPLASTY;  Surgeon: Marin Shutter, MD;  Location: [place];  Service: Orthopedics;  Laterality: Right;    There were no vitals filed for this visit.  Visit Diagnosis:  Right-sided low back pain with right-sided sciatica  Pain in joint, shoulder region, right  Midline low back pain, with sciatica presence unspecified      Subjective Assessment - 08/25/15 1106    Subjective Laying down he feels no pain to do exercises.    Currently in Pain? No/denies   Multiple Pain Sites No                         [facility] Adult PT Treatment/Exercise - 08/25/15 0001    Lumbar Exercises: Supine   Ab Set --  TA series    Lumbar Exercises: Sidelying   Other Sidelying Lumbar Exercises Modified  side plank bil 2 x10 sec                PT Education - 08/25/15 1115    Education provided Yes   Education Details TA series, side plank   Person(s) Educated Patient   Methods Explanation;Demonstration;Tactile cues;Verbal cues;Handout   Comprehension Verbalized understanding;Returned demonstration  PT Short Term Goals - 08/23/15 1226    PT SHORT TERM GOAL #1   Title be independent in initial HEP   Time 4   Period Weeks   Status New   PT SHORT TERM GOAL #2   Title report a 10% reduction in Rt LE pain with standing in the kitchen   Time 4   Period Weeks   Status New           PT Long Term Goals - 08/23/15 1140    PT LONG TERM GOAL #1   Title be independent in advanced HEP   Time 8   Period Weeks   Status New   PT LONG TERM GOAL #2   Title reduce FOTO to < or = to 41% limitation   Time 8   Period Weeks   Status New   PT LONG TERM GOAL #3   Title report a 25% reduction in Rt LE pain with stanidng   Time 8   Period Weeks   Status New   PT LONG TERM GOAL #4   Title understand how to safely progress exercise and core strength exercises after discharge   Time 8   Period  Weeks   Status New   PT LONG TERM GOAL #5   Title --               Plan - 08/25/15 1123    Clinical Impression Statement Advanced HEP/core work today.  Pt can exercise pain free in supine. Pt just evaluated Monday, too early for any change/goals.    Pt will benefit from skilled therapeutic intervention in order to improve on the following deficits Pain;Decreased activity tolerance;Improper body mechanics;Decreased endurance   Rehab Potential Good   PT Frequency 3x / week   PT Duration 8 weeks   PT Treatment/Interventions ADLs/Self Care Home Management;Cryotherapy;Electrical Stimulation;Moist Heat;Therapeutic exercise;Therapeutic activities;Functional mobility training;Neuromuscular re-education;Patient/family education;Manual techniques;Passive range of motion   PT Next Visit Plan Advance TA exercises to add leg movements, review flexibility exercises that pt is performing at home, decompression, body mechanics education   Consulted and Agree with Plan of Care Patient        Problem List Patient Active Problem List   Diagnosis Date Noted  . Right lumbar radiculopathy 08/24/2014  . Piriformis syndrome of right side 08/24/2014  . S/P shoulder replacement 12/25/2013  . Migraine headache 12/22/2013  . OA (osteoarthritis) of knee 08/25/2013  . HYPERLIPIDEMIA 07/29/2007  . NEPHROLITHIASIS, HX OF 07/29/2007  . HYPERCHOLESTEROLEMIA 04/22/2007  . ALLERGIC RHINITIS 04/22/2007  . Osteoarthrosis, unspecified whether generalized or localized, unspecified site 04/22/2007    [facility] , PTA  08/25/2015, 11:43 AM  [facility] [address], [place], [place], [address] Phone: [phone]   Fax:  [phone]  Name: [name] [name] MRN: [id number] Date of Birth: Feb 08, 1951

## 2015-08-26 ENCOUNTER — Ambulatory Visit: Payer: Medicare Other | Admitting: Physical Therapy

## 2015-08-31 ENCOUNTER — Ambulatory Visit: Payer: Medicare Other | Attending: Specialist | Admitting: Physical Therapy

## 2015-08-31 DIAGNOSIS — M545 Low back pain: Secondary | ICD-10-CM | POA: Diagnosis present

## 2015-08-31 DIAGNOSIS — M5441 Lumbago with sciatica, right side: Secondary | ICD-10-CM | POA: Diagnosis present

## 2015-08-31 DIAGNOSIS — M25511 Pain in right shoulder: Secondary | ICD-10-CM | POA: Insufficient documentation

## 2015-08-31 NOTE — Therapy (Signed)
[facility] [address], [place], [place], [address] Phone: [phone]   Fax:  [phone]  Physical Therapy Treatment  Patient Details  Name: [name] [name] MRN: [id number] Date of Birth: Jun 30, 1951 Referring Provider: Susa Day, MD  Encounter Date: 08/31/2015      PT End of Session - 08/31/15 0927    Visit Number 3   Number of Visits 10   Date for PT Re-Evaluation 10/18/15   PT Start Time 0848   PT Stop Time 0930   PT Time Calculation (min) 42 min   Activity Tolerance Patient tolerated treatment well   Behavior During Therapy [facility] for tasks assessed/performed      Past Medical History  Diagnosis Date  . ALLERGIC RHINITIS 04/22/2007  . HYPERCHOLESTEROLEMIA 04/22/2007  . HYPERLIPIDEMIA 07/29/2007  . NEPHROLITHIASIS, HX OF 07/29/2007  . Osteoarth NOS-Unspec 04/22/2007  . Headache(784.0)     migraines  . DDD (degenerative disc disease), cervical   . Shoulder pain, acute     bilateral  . History of kidney stones   . History of skin cancer   . GERD (gastroesophageal reflux disease)     infrequently  . History of TMJ disorder   . Complication of anesthesia     more concerned about positioning of head & neck because of TMJ  . Cancer [facility])     HX SKIN CANCER    Past Surgical History  Procedure Laterality Date  . Appendectomy    . Knee arthroscopy      right x2  . Shoulder surgery      right  . Total knee arthroplasty Right 08/25/2013    Procedure: RIGHT TOTAL KNEE ARTHROPLASTY;  Surgeon: Gearlean Alf, MD;  Location: [facility];  Service: Orthopedics;  Laterality: Right;  . Joint replacement    . Total shoulder arthroplasty Left 12/25/2013    DR SUPPLE   . Total shoulder arthroplasty Left 12/25/2013    Procedure: LEFT TOTAL SHOULDER ARTHROPLASTY;  Surgeon: Marin Shutter, MD;  Location: [place];  Service: Orthopedics;  Laterality: Left;  . Total shoulder arthroplasty Right 06/18/2014    DR SUPPLE  .  Total shoulder arthroplasty Right 06/18/2014    Procedure: RIGHT TOTAL SHOULDER ARTHROPLASTY;  Surgeon: Marin Shutter, MD;  Location: [place];  Service: Orthopedics;  Laterality: Right;    There were no vitals filed for this visit.  Visit Diagnosis:  Right-sided low back pain with right-sided sciatica      Subjective Assessment - 08/31/15 0857    Subjective Pt states "I feel the same, it hurts really bad when I stand"   Pertinent History Rt total knee replacement, bilateral shoulder replacmements.     Pain Score 0-No pain  no pain with lying down                         [facility] Adult PT Treatment/Exercise - 08/31/15 0001    Lumbar Exercises: Stretches   Single Knee to Chest Stretch 3 reps;20 seconds   Standing Side Bend 3 reps;30 seconds  sidelying over blue half roll   Lumbar Exercises: Supine   Ab Set --  TA series   Other Supine Lumbar Exercises dead bug (alt UE/LE with TA stabilization)   Lumbar Exercises: Quadruped   Single Arm Raise Right;Left;10 reps;2 seconds   Straight Leg Raise 10 reps;2 seconds  tactile cues to keep core tight   Knee/Hip Exercises: Seated   Long Arc  Quad Both;10 reps  with TA contraction and breathing   Knee/Hip Flexion marching x 10 bilat with TA contraction and breathing   Manual Therapy   Manual Therapy Soft tissue mobilization   Manual therapy comments pt tender Rt quadratus                PT Education - 08/31/15 0927    Education provided Yes   Education Details HEP for quadruped exercises and QL stretch   Person(s) Educated Patient   Methods Explanation;Demonstration;Verbal cues;Tactile cues   Comprehension Verbalized understanding;Returned demonstration          PT Short Term Goals - 08/31/15 0932    PT SHORT TERM GOAL #1   Title be independent in initial HEP   Time 4   Period Weeks   Status On-going   PT SHORT TERM GOAL #2   Title report a 10% reduction in Rt LE pain with standing in the kitchen   Time  4   Period Weeks   Status On-going           PT Long Term Goals - 08/31/15 [id number]    PT LONG TERM GOAL #1   Title be independent in advanced HEP   Time 8   Period Weeks   Status On-going   PT LONG TERM GOAL #2   Title reduce FOTO to < or = to 41% limitation   Time 8   Period Weeks   Status On-going   PT LONG TERM GOAL #3   Title report a 25% reduction in Rt LE pain with stanidng   Time 8   Period Weeks   Status On-going   PT LONG TERM GOAL #4   Title understand how to safely progress exercise and core strength exercises after discharge   Time 8   Period Weeks   Status On-going               Plan - 08/31/15 0931    Clinical Impression Statement Able to advance HEP today, pt without pain with supine therex, able to tolerate quadruped despite shoulder replacements.  Pt is eager to improve and advance   Pt will benefit from skilled therapeutic intervention in order to improve on the following deficits Pain;Decreased activity tolerance;Improper body mechanics;Decreased endurance   PT Frequency 3x / week   PT Duration 8 weeks   PT Treatment/Interventions ADLs/Self Care Home Management;Cryotherapy;Electrical Stimulation;Moist Heat;Therapeutic exercise;Therapeutic activities;Functional mobility training;Neuromuscular re-education;Patient/family education;Manual techniques;Passive range of motion   PT Next Visit Plan try core stabilization on therapy ball, progress TA exercises   Consulted and Agree with Plan of Care Patient        Problem List Patient Active Problem List   Diagnosis Date Noted  . Right lumbar radiculopathy 08/24/2014  . Piriformis syndrome of right side 08/24/2014  . S/P shoulder replacement 12/25/2013  . Migraine headache 12/22/2013  . OA (osteoarthritis) of knee 08/25/2013  . HYPERLIPIDEMIA 07/29/2007  . NEPHROLITHIASIS, HX OF 07/29/2007  . HYPERCHOLESTEROLEMIA 04/22/2007  . ALLERGIC RHINITIS 04/22/2007  . Osteoarthrosis, unspecified whether  generalized or localized, unspecified site 04/22/2007    Isabelle Course, PT, DPT  08/31/2015, 9:36 AM  [facility] [address], [place], [place], [address] Phone: [phone]   Fax:  [phone]  Name: [name] [name] MRN: [id number] Date of Birth: 1951-02-15

## 2015-09-01 ENCOUNTER — Ambulatory Visit: Payer: Medicare Other | Admitting: Physical Therapy

## 2015-09-01 ENCOUNTER — Encounter: Payer: Self-pay | Admitting: Physical Therapy

## 2015-09-01 DIAGNOSIS — M5441 Lumbago with sciatica, right side: Secondary | ICD-10-CM

## 2015-09-01 DIAGNOSIS — M545 Low back pain: Secondary | ICD-10-CM

## 2015-09-01 NOTE — Therapy (Signed)
[facility] [address] [place], [place], [address] Phone: [phone]   Fax:  [phone]  Physical Therapy Treatment  Patient Details  Name: [name] [name] MRN: [id number] Date of Birth: June 09, 1951 Referring Provider: Susa Day, MD  Encounter Date: 09/01/2015      PT End of Session - 09/01/15 0947    Visit Number 4   Number of Visits 10   Date for PT Re-Evaluation 10/18/15   PT Start Time 0931   PT Stop Time 1018   PT Time Calculation (min) 47 min   Activity Tolerance Patient tolerated treatment well   Behavior During Therapy [facility] for tasks assessed/performed      Past Medical History  Diagnosis Date  . ALLERGIC RHINITIS 04/22/2007  . HYPERCHOLESTEROLEMIA 04/22/2007  . HYPERLIPIDEMIA 07/29/2007  . NEPHROLITHIASIS, HX OF 07/29/2007  . Osteoarth NOS-Unspec 04/22/2007  . Headache(784.0)     migraines  . DDD (degenerative disc disease), cervical   . Shoulder pain, acute     bilateral  . History of kidney stones   . History of skin cancer   . GERD (gastroesophageal reflux disease)     infrequently  . History of TMJ disorder   . Complication of anesthesia     more concerned about positioning of head & neck because of TMJ  . Cancer [facility])     HX SKIN CANCER    Past Surgical History  Procedure Laterality Date  . Appendectomy    . Knee arthroscopy      right x2  . Shoulder surgery      right  . Total knee arthroplasty Right 08/25/2013    Procedure: RIGHT TOTAL KNEE ARTHROPLASTY;  Surgeon: Gearlean Alf, MD;  Location: [facility];  Service: Orthopedics;  Laterality: Right;  . Joint replacement    . Total shoulder arthroplasty Left 12/25/2013    DR SUPPLE   . Total shoulder arthroplasty Left 12/25/2013    Procedure: LEFT TOTAL SHOULDER ARTHROPLASTY;  Surgeon: Marin Shutter, MD;  Location: [place];  Service: Orthopedics;  Laterality: Left;  . Total shoulder arthroplasty Right 06/18/2014    DR SUPPLE  .  Total shoulder arthroplasty Right 06/18/2014    Procedure: RIGHT TOTAL SHOULDER ARTHROPLASTY;  Surgeon: Marin Shutter, MD;  Location: [place];  Service: Orthopedics;  Laterality: Right;    There were no vitals filed for this visit.  Visit Diagnosis:  Right-sided low back pain with right-sided sciatica  Midline low back pain, with sciatica presence unspecified      Subjective Assessment - 09/01/15 0941    Subjective About the same: I had a lot of coughing which may have aggrevated my leg.    Currently in Pain? No/denies   Multiple Pain Sites No                         [facility] Adult PT Treatment/Exercise - 09/01/15 0001    Lumbar Exercises: Supine   Bent Knee Raise 10 reps  Progressed to alternating heel taps, added to HEP   Lumbar Exercises: Sidelying   Other Sidelying Lumbar Exercises Sidelying hip series bil   Lumbar Exercises: Quadruped   Single Arm Raise Right;Left;5 reps   Straight Leg Raise 5 reps;4 seconds  VC/TC for technique                PT Education - 09/01/15 1020    Education provided Yes   Education Details Sidelying hip series  bil   Person(s) Educated Patient   Methods Explanation;Demonstration;Tactile cues;Verbal cues;Handout   Comprehension Verbalized understanding;Returned demonstration          PT Short Term Goals - 09/01/15 1017    PT SHORT TERM GOAL #1   Title be independent in initial HEP   Period Weeks   Status Achieved   PT SHORT TERM GOAL #2   Title report a 10% reduction in Rt LE pain with standing in the kitchen   Time 4   Period Weeks   Status On-going  No significant change yet.           PT Long Term Goals - 08/31/15 0933    PT LONG TERM GOAL #1   Title be independent in advanced HEP   Time 8   Period Weeks   Status On-going   PT LONG TERM GOAL #2   Title reduce FOTO to < or = to 41% limitation   Time 8   Period Weeks   Status On-going   PT LONG TERM GOAL #3   Title report a 25% reduction in Rt  LE pain with stanidng   Time 8   Period Weeks   Status On-going   PT LONG TERM GOAL #4   Title understand how to safely progress exercise and core strength exercises after discharge   Time 8   Period Weeks   Status On-going               Plan - 09/01/15 1010    Clinical Impression Statement Advanced HEP to include core stabilization exercises in sidelying, and we worked on refining the technique in his quadruped exercises. No significant change in pain at this time with standing activites.    Pt will benefit from skilled therapeutic intervention in order to improve on the following deficits Pain;Decreased activity tolerance;Improper body mechanics;Decreased endurance   Rehab Potential Good   PT Frequency 3x / week   PT Duration 8 weeks   PT Treatment/Interventions ADLs/Self Care Home Management;Cryotherapy;Electrical Stimulation;Moist Heat;Therapeutic exercise;Therapeutic activities;Functional mobility training;Neuromuscular re-education;Patient/family education;Manual techniques;Passive range of motion   PT Next Visit Plan Reviewnew exercises, continue with core work   Consulted and Agree with Plan of Care Patient        Problem List Patient Active Problem List   Diagnosis Date Noted  . Right lumbar radiculopathy 08/24/2014  . Piriformis syndrome of right side 08/24/2014  . S/P shoulder replacement 12/25/2013  . Migraine headache 12/22/2013  . OA (osteoarthritis) of knee 08/25/2013  . HYPERLIPIDEMIA 07/29/2007  . NEPHROLITHIASIS, HX OF 07/29/2007  . HYPERCHOLESTEROLEMIA 04/22/2007  . ALLERGIC RHINITIS 04/22/2007  . Osteoarthrosis, unspecified whether generalized or localized, unspecified site 04/22/2007    [facility], PTA 09/01/2015, 10:22 AM  [facility] [address], [place], [place], [address] Phone: [phone]   Fax:  [phone]  Name: [name] [name] MRN: [id number] Date of Birth:  1950-09-29

## 2015-09-01 NOTE — Patient Instructions (Signed)
Notes for hands and knees home exercises:  - Inhale to prepare, exhale to extend the legs as your deepen the core contraction.   Starting position is having that small baby arch to the lumbar spine, think & feel the spine being long.  New Exercises:  - Alternating heel taps from table top position. Inhale tap the floor, exhale to return the leg back via the core PULLING the thigh back. Do 5-10x each leg. Tap closer to buttocks for easy, farther away for increased difficulty.   _ Sidelying hip series:      Lay on side with head on pillow     Engage core, lengthen your leg, no inch worm in your back muscles      Leg goes forward and back, inhale forward exhale back without changing the shape of your back, keep the core engaged 10x        Leg circles, size of a grapefruit: slow and steady, keep core engaged. 10x       Leg lifts, small!! Lift with side butt muscle. 10x  Take small breaks if needed!! This is ok!!

## 2015-09-06 ENCOUNTER — Encounter: Payer: Self-pay | Admitting: Physical Therapy

## 2015-09-06 ENCOUNTER — Ambulatory Visit: Payer: Medicare Other | Admitting: Physical Therapy

## 2015-09-06 DIAGNOSIS — M5441 Lumbago with sciatica, right side: Secondary | ICD-10-CM | POA: Diagnosis not present

## 2015-09-06 DIAGNOSIS — M25511 Pain in right shoulder: Secondary | ICD-10-CM

## 2015-09-06 DIAGNOSIS — M545 Low back pain: Secondary | ICD-10-CM

## 2015-09-06 NOTE — Therapy (Signed)
[facility] [address] [place], [place], [address] Phone: [phone]   Fax:  [phone]  Physical Therapy Treatment  Patient Details  Name: [name] [name] MRN: [id number] Date of Birth: [birth year] Referring Provider: Susa Day, MD  Encounter Date: 09/06/2015      PT End of Session - 09/06/15 1024    Visit Number 5   Number of Visits 10   Date for PT Re-Evaluation 10/18/15   PT Start Time 1020   PT Stop Time 1120   PT Time Calculation (min) 60 min   Activity Tolerance Patient limited by pain   Behavior During Therapy [facility] for tasks assessed/performed      Past Medical History  Diagnosis Date  . ALLERGIC RHINITIS 04/22/2007  . HYPERCHOLESTEROLEMIA 04/22/2007  . HYPERLIPIDEMIA 07/29/2007  . NEPHROLITHIASIS, HX OF 07/29/2007  . Osteoarth NOS-Unspec 04/22/2007  . Headache(784.0)     migraines  . DDD (degenerative disc disease), cervical   . Shoulder pain, acute     bilateral  . History of kidney stones   . History of skin cancer   . GERD (gastroesophageal reflux disease)     infrequently  . History of TMJ disorder   . Complication of anesthesia     more concerned about positioning of head & neck because of TMJ  . Cancer [facility])     HX SKIN CANCER    Past Surgical History  Procedure Laterality Date  . Appendectomy    . Knee arthroscopy      right x2  . Shoulder surgery      right  . Total knee arthroplasty Right 08/25/2013    Procedure: RIGHT TOTAL KNEE ARTHROPLASTY;  Surgeon: Gearlean Alf, MD;  Location: [facility];  Service: Orthopedics;  Laterality: Right;  . Joint replacement    . Total shoulder arthroplasty Left 12/25/2013    DR SUPPLE   . Total shoulder arthroplasty Left 12/25/2013    Procedure: LEFT TOTAL SHOULDER ARTHROPLASTY;  Surgeon: Marin Shutter, MD;  Location: [place];  Service: Orthopedics;  Laterality: Left;  . Total shoulder arthroplasty Right 06/18/2014    DR SUPPLE  . Total  shoulder arthroplasty Right 06/18/2014    Procedure: RIGHT TOTAL SHOULDER ARTHROPLASTY;  Surgeon: Marin Shutter, MD;  Location: [place];  Service: Orthopedics;  Laterality: Right;    There were no vitals filed for this visit.  Visit Diagnosis:  Right-sided low back pain with right-sided sciatica  Midline low back pain, with sciatica presence unspecified  Pain in joint, shoulder region, right      Subjective Assessment - 09/06/15 1023    Subjective Not a great day. Weather maybe????   Pain Score 8    Pain Location Buttocks   Pain Orientation Right   Pain Descriptors / Indicators Sharp;Throbbing   Aggravating Factors  Standing   Pain Relieving Factors LAying down is some helpful.   Multiple Pain Sites No                         [facility] Adult PT Treatment/Exercise - 09/06/15 0001    Lumbar Exercises: Supine   Other Supine Lumbar Exercises Marching, single leg bicycle, heel taps, full bicycle,    Knee/Hip Exercises: Stretches   Other Knee/Hip Stretches Glute medius bil stretches    Knee/Hip Exercises: Sidelying   Other Sidelying Knee/Hip Exercises Hip kicks, circles, lifts 10x each   Moist Heat Therapy   Number Minutes Moist Heat  15 Minutes   Moist Heat Location --  Buttocks in SL   Electrical Stimulation   Electrical Stimulation Location RT lumbar to buttocks   Electrical Stimulation Action IFC   Electrical Stimulation Parameters 80-150 Hz   Electrical Stimulation Goals Pain                PT Education - 09/06/15 1049    Education provided Yes   Education Details Modification for RT side plank to protect shoulder   Person(s) Educated Patient   Methods Explanation;Demonstration;Tactile cues;Verbal cues;Handout   Comprehension Verbalized understanding;Returned demonstration          PT Short Term Goals - 09/06/15 1024    PT SHORT TERM GOAL #1   Title be independent in initial HEP   Time 4   Period Weeks   Status Achieved   PT SHORT TERM  GOAL #2   Title report a 10% reduction in Rt LE pain with standing in the kitchen   Time 4   Period Weeks   Status On-going           PT Long Term Goals - 08/31/15 4492    PT LONG TERM GOAL #1   Title be independent in advanced HEP   Time 8   Period Weeks   Status On-going   PT LONG TERM GOAL #2   Title reduce FOTO to < or = to 41% limitation   Time 8   Period Weeks   Status On-going   PT LONG TERM GOAL #3   Title report a 25% reduction in Rt LE pain with stanidng   Time 8   Period Weeks   Status On-going   PT LONG TERM GOAL #4   Title understand how to safely progress exercise and core strength exercises after discharge   Time 8   Period Weeks   Status On-going               Plan - 09/06/15 1104    Clinical Impression Statement Complinat with HEp and able to without exacerbating pain/symptoms. Goals not met due to no change functional especially in his standing tolerance. He sees Neuro MD late December.    Pt will benefit from skilled therapeutic intervention in order to improve on the following deficits Pain;Decreased activity tolerance;Improper body mechanics;Decreased endurance   Rehab Potential Good   PT Frequency 3x / week   PT Duration 8 weeks   PT Treatment/Interventions ADLs/Self Care Home Management;Cryotherapy;Electrical Stimulation;Moist Heat;Therapeutic exercise;Therapeutic activities;Functional mobility training;Neuromuscular re-education;Patient/family education;Manual techniques;Passive range of motion   PT Next Visit Plan See how Estim worked for pain, continue with core exercises.   Consulted and Agree with Plan of Care Patient        Problem List Patient Active Problem List   Diagnosis Date Noted  . Right lumbar radiculopathy 08/24/2014  . Piriformis syndrome of right side 08/24/2014  . S/P shoulder replacement 12/25/2013  . Migraine headache 12/22/2013  . OA (osteoarthritis) of knee 08/25/2013  . HYPERLIPIDEMIA 07/29/2007  .  NEPHROLITHIASIS, HX OF 07/29/2007  . HYPERCHOLESTEROLEMIA 04/22/2007  . ALLERGIC RHINITIS 04/22/2007  . Osteoarthrosis, unspecified whether generalized or localized, unspecified site 04/22/2007    [facility], PTA 09/06/2015, 11:06 AM  [facility] [address] [place], [place], [address] Phone: [phone]   Fax:  [phone]  Name: [name] [name] MRN: [id number] Date of Birth: 1951-04-10

## 2015-09-06 NOTE — Patient Instructions (Signed)
Modification for RT side plank to make it easier for your shoulder.    Keep head DOWN, in rested position. Knees bent about half way, and you lift the feet/ankles: hold for 15-20 sec with core engaged. Do 3 x

## 2015-09-07 ENCOUNTER — Encounter: Payer: Medicare Other | Admitting: Physical Therapy

## 2015-09-07 ENCOUNTER — Telehealth: Payer: Self-pay | Admitting: Internal Medicine

## 2015-09-07 NOTE — Telephone Encounter (Signed)
yes

## 2015-09-07 NOTE — Telephone Encounter (Signed)
Pt call to say that he need a rx for a flex -tens stimulator unit.  He has one now but nobody has the batteries. He contacted [facility] and they told him they have the machine but it will require a rx.    Pt is asking if you will write him a RX

## 2015-09-08 ENCOUNTER — Telehealth: Payer: Self-pay

## 2015-09-08 ENCOUNTER — Other Ambulatory Visit: Payer: Self-pay | Admitting: Internal Medicine

## 2015-09-08 MED ORDER — SUMATRIPTAN SUCCINATE 100 MG PO TABS: 100.0000 mg | ORAL_TABLET | ORAL | Status: DC | PRN

## 2015-09-08 MED ORDER — ALLOPURINOL 100 MG PO TABS: 100.0000 mg | ORAL_TABLET | Freq: Every day | ORAL | Status: DC

## 2015-09-08 NOTE — Telephone Encounter (Signed)
Pt requesting refill on SUMATRIPTAN 100MG   Pt last visit 08/13/15  Pt last Rx refill 02/11/15 #10 with 5 refills

## 2015-09-08 NOTE — Telephone Encounter (Signed)
Rx was sent  

## 2015-09-08 NOTE — Telephone Encounter (Signed)
Rx is ready for pick up. Pt is aware  

## 2015-09-08 NOTE — Addendum Note (Signed)
Addended by: Ailene Rud E on: 09/08/2015 12:59 PM   Modules accepted: Orders

## 2015-09-08 NOTE — Telephone Encounter (Signed)
ok 

## 2015-09-09 ENCOUNTER — Encounter: Payer: Medicare Other | Admitting: Physical Therapy

## 2015-09-13 ENCOUNTER — Other Ambulatory Visit: Payer: Self-pay | Admitting: Family Medicine

## 2015-09-13 ENCOUNTER — Telehealth: Payer: Self-pay | Admitting: Internal Medicine

## 2015-09-13 MED ORDER — PRAVASTATIN SODIUM 40 MG PO TABS: 40.0000 mg | ORAL_TABLET | Freq: Every day | ORAL | Status: DC

## 2015-09-13 NOTE — Telephone Encounter (Signed)
Rx sent to pharmacy   

## 2015-09-13 NOTE — Telephone Encounter (Signed)
Pt request refill of the following: pravastatin (PRAVACHOL) 40 MG tablet   Phamacy: [facility]

## 2015-09-13 NOTE — Telephone Encounter (Signed)
Last filled 08/14/14

## 2015-09-14 ENCOUNTER — Encounter: Payer: Medicare Other | Admitting: Physical Therapy

## 2015-09-15 ENCOUNTER — Ambulatory Visit: Payer: Medicare Other | Admitting: Physical Therapy

## 2015-09-15 ENCOUNTER — Encounter: Payer: Self-pay | Admitting: Physical Therapy

## 2015-09-15 DIAGNOSIS — M25511 Pain in right shoulder: Secondary | ICD-10-CM

## 2015-09-15 DIAGNOSIS — M5441 Lumbago with sciatica, right side: Secondary | ICD-10-CM

## 2015-09-15 DIAGNOSIS — M545 Low back pain: Secondary | ICD-10-CM

## 2015-09-15 NOTE — Therapy (Addendum)
[facility] [address], [place], [place], [address] Phone: [phone]   Fax:  [phone]  Physical Therapy Treatment  Patient Details  Name: [name] [name] MRN: [id number] Date of Birth: August 07, 1951 Referring Provider: Susa Day, MD  Encounter Date: 09/15/2015      PT End of Session - 09/15/15 0856    Visit Number 6   Number of Visits 10   Date for PT Re-Evaluation 10/18/15   PT Start Time 0835   PT Stop Time 0925   PT Time Calculation (min) 50 min   Activity Tolerance Patient tolerated treatment well   Behavior During Therapy [name] for tasks assessed/performed      Past Medical History  Diagnosis Date  . ALLERGIC RHINITIS 04/22/2007  . HYPERCHOLESTEROLEMIA 04/22/2007  . HYPERLIPIDEMIA 07/29/2007  . NEPHROLITHIASIS, HX OF 07/29/2007  . Osteoarth NOS-Unspec 04/22/2007  . Headache(784.0)     migraines  . DDD (degenerative disc disease), cervical   . Shoulder pain, acute     bilateral  . History of kidney stones   . History of skin cancer   . GERD (gastroesophageal reflux disease)     infrequently  . History of TMJ disorder   . Complication of anesthesia     more concerned about positioning of head & neck because of TMJ  . Cancer [facility])     HX SKIN CANCER    Past Surgical History  Procedure Laterality Date  . Appendectomy    . Knee arthroscopy      right x2  . Shoulder surgery      right  . Total knee arthroplasty Right 08/25/2013    Procedure: RIGHT TOTAL KNEE ARTHROPLASTY;  Surgeon: Gearlean Alf, MD;  Location: [facility];  Service: Orthopedics;  Laterality: Right;  . Joint replacement    . Total shoulder arthroplasty Left 12/25/2013    DR SUPPLE   . Total shoulder arthroplasty Left 12/25/2013    Procedure: LEFT TOTAL SHOULDER ARTHROPLASTY;  Surgeon: Marin Shutter, MD;  Location: [place];  Service: Orthopedics;  Laterality: Left;  . Total shoulder arthroplasty Right 06/18/2014    DR SUPPLE  .  Total shoulder arthroplasty Right 06/18/2014    Procedure: RIGHT TOTAL SHOULDER ARTHROPLASTY;  Surgeon: Marin Shutter, MD;  Location: [place];  Service: Orthopedics;  Laterality: Right;    There were no vitals filed for this visit.  Visit Diagnosis:  Right-sided low back pain with right-sided sciatica  Midline low back pain, with sciatica presence unspecified  Pain in joint, shoulder region, right      Subjective Assessment - 09/15/15 0857    Subjective Doing HEP faithfully. Can walk 1/2 mile before intense pain.   Currently in Pain? No/denies   Multiple Pain Sites No                         [facility] Adult PT Treatment/Exercise - 09/15/15 0001    Lumbar Exercises: Stretches   Active Hamstring Stretch 3 reps;30 seconds   Single Knee to Chest Stretch 3 reps;20 seconds   Lumbar Exercises: Supine   Other Supine Lumbar Exercises Ab curl: 2x10  Pillow case to support the neck   Lumbar Exercises: Sidelying   Other Sidelying Lumbar Exercises Sidelying hip series  bil 10x    Knee/Hip Exercises: Sidelying   Clams Green band bil 20x  PT Short Term Goals - 09/15/15 0925    PT SHORT TERM GOAL #2   Title report a 10% reduction in Rt LE pain with standing in the kitchen   Period Weeks   Status Achieved           PT Long Term Goals - 08/31/15 2010    PT LONG TERM GOAL #1   Title be independent in advanced HEP   Time 8   Period Weeks   Status On-going   PT LONG TERM GOAL #2   Title reduce FOTO to < or = to 41% limitation   Time 8   Period Weeks   Status On-going   PT LONG TERM GOAL #3   Title report a 25% reduction in Rt LE pain with stanidng   Time 8   Period Weeks   Status On-going   PT LONG TERM GOAL #4   Title understand how to safely progress exercise and core strength exercises after discharge   Time 8   Period Weeks   Status On-going               Plan - 09/15/15 0712    Clinical Impression Statement Doing  excellent with his HEP, both in execution and compliance. He is taking his time and really working towards doing the exercises correctly. Now he does seems to have intense pain when he overodes it, but overall during discussion it seems he is doing more activity throughout the day than he was doing prior to PT.    Pt will benefit from skilled therapeutic intervention in order to improve on the following deficits Pain;Decreased activity tolerance;Improper body mechanics;Decreased endurance   Rehab Potential Good   PT Frequency 3x / week   PT Duration 8 weeks   PT Treatment/Interventions ADLs/Self Care Home Management;Cryotherapy;Electrical Stimulation;Moist Heat;Therapeutic exercise;Therapeutic activities;Functional mobility training;Neuromuscular re-education;Patient/family education;Manual techniques;Passive range of motion   PT Next Visit Plan To MD next Wednesday   Consulted and Agree with Plan of Care Patient        Problem List Patient Active Problem List   Diagnosis Date Noted  . Right lumbar radiculopathy 08/24/2014  . Piriformis syndrome of right side 08/24/2014  . S/P shoulder replacement 12/25/2013  . Migraine headache 12/22/2013  . OA (osteoarthritis) of knee 08/25/2013  . HYPERLIPIDEMIA 07/29/2007  . NEPHROLITHIASIS, HX OF 07/29/2007  . HYPERCHOLESTEROLEMIA 04/22/2007  . ALLERGIC RHINITIS 04/22/2007  . Osteoarthrosis, unspecified whether generalized or localized, unspecified site 04/22/2007    Thania Woodlief, PTA 09/15/2015, 9:26 AM PHYSICAL THERAPY DISCHARGE SUMMARY  Visits from Start of Care: 6  Current functional level related to goals / functional outcomes: See above for most current status.  Pt will have back surgery 09/2015 and will be discharged at this time.     Remaining deficits: Significant back pain that limits function.  Pt will have surgery.     Education / Equipment: HEP, Economist Plan: Patient agrees to discharge.  Patient goals were  partially met. Patient is being discharged due to a change in medical status.  ?????   Sigurd Sos, PT 09/28/2015 8:50 AM  [facility] [address], [place], [place], [address] Phone: [phone]   Fax:  [phone]  Name: [name] [name] MRN: [id number] Date of Birth: December 12, 1950

## 2015-09-17 ENCOUNTER — Ambulatory Visit: Payer: Medicare Other | Admitting: Physical Therapy

## 2015-09-23 ENCOUNTER — Encounter: Payer: Medicare Other | Admitting: Physical Therapy

## 2015-10-04 ENCOUNTER — Encounter: Payer: Self-pay | Admitting: Internal Medicine

## 2015-10-05 DIAGNOSIS — E7800 Pure hypercholesterolemia, unspecified: Secondary | ICD-10-CM | POA: Diagnosis not present

## 2015-10-05 DIAGNOSIS — B001 Herpesviral vesicular dermatitis: Secondary | ICD-10-CM | POA: Diagnosis not present

## 2015-10-05 DIAGNOSIS — M4806 Spinal stenosis, lumbar region: Secondary | ICD-10-CM | POA: Diagnosis not present

## 2015-10-05 DIAGNOSIS — M4316 Spondylolisthesis, lumbar region: Secondary | ICD-10-CM | POA: Diagnosis not present

## 2015-10-05 DIAGNOSIS — Z96611 Presence of right artificial shoulder joint: Secondary | ICD-10-CM | POA: Diagnosis not present

## 2015-10-05 DIAGNOSIS — M109 Gout, unspecified: Secondary | ICD-10-CM | POA: Diagnosis not present

## 2015-10-05 DIAGNOSIS — E785 Hyperlipidemia, unspecified: Secondary | ICD-10-CM | POA: Diagnosis not present

## 2015-10-05 DIAGNOSIS — M549 Dorsalgia, unspecified: Secondary | ICD-10-CM | POA: Diagnosis not present

## 2015-10-05 DIAGNOSIS — G8929 Other chronic pain: Secondary | ICD-10-CM | POA: Diagnosis not present

## 2015-10-05 DIAGNOSIS — G43909 Migraine, unspecified, not intractable, without status migrainosus: Secondary | ICD-10-CM | POA: Diagnosis not present

## 2015-10-05 DIAGNOSIS — G43009 Migraine without aura, not intractable, without status migrainosus: Secondary | ICD-10-CM | POA: Diagnosis not present

## 2015-10-05 DIAGNOSIS — M1A9XX0 Chronic gout, unspecified, without tophus (tophi): Secondary | ICD-10-CM | POA: Diagnosis not present

## 2015-10-05 DIAGNOSIS — J310 Chronic rhinitis: Secondary | ICD-10-CM | POA: Diagnosis not present

## 2015-10-05 DIAGNOSIS — M4310 Spondylolisthesis, site unspecified: Secondary | ICD-10-CM | POA: Diagnosis not present

## 2015-10-05 DIAGNOSIS — M5416 Radiculopathy, lumbar region: Secondary | ICD-10-CM | POA: Diagnosis not present

## 2015-10-05 DIAGNOSIS — Z96612 Presence of left artificial shoulder joint: Secondary | ICD-10-CM | POA: Diagnosis not present

## 2015-10-07 HISTORY — PX: BACK SURGERY: SHX140

## 2015-10-12 ENCOUNTER — Encounter: Payer: Self-pay | Admitting: Internal Medicine

## 2015-10-14 ENCOUNTER — Encounter: Payer: Self-pay | Admitting: Internal Medicine

## 2015-10-14 ENCOUNTER — Ambulatory Visit (INDEPENDENT_AMBULATORY_CARE_PROVIDER_SITE_OTHER): Payer: Commercial Managed Care - HMO | Admitting: Internal Medicine

## 2015-10-14 VITALS — BP 120/86 | HR 84 | Temp 97.8°F | Resp 20 | Ht 69.0 in | Wt 203.0 lb

## 2015-10-14 DIAGNOSIS — M150 Primary generalized (osteo)arthritis: Secondary | ICD-10-CM | POA: Diagnosis not present

## 2015-10-14 DIAGNOSIS — M159 Polyosteoarthritis, unspecified: Secondary | ICD-10-CM

## 2015-10-14 DIAGNOSIS — G43001 Migraine without aura, not intractable, with status migrainosus: Secondary | ICD-10-CM | POA: Diagnosis not present

## 2015-10-14 MED ORDER — AMITRIPTYLINE HCL 25 MG PO TABS: 25.0000 mg | ORAL_TABLET | Freq: Every day | ORAL | Status: DC

## 2015-10-14 MED ORDER — SUMATRIPTAN SUCCINATE 100 MG PO TABS: 100.0000 mg | ORAL_TABLET | ORAL | Status: DC | PRN

## 2015-10-14 NOTE — Progress Notes (Signed)
Pre visit review using our clinic review tool, if applicable. No additional management support is needed unless otherwise documented below in the visit note. 

## 2015-10-14 NOTE — Progress Notes (Signed)
Subjective:    Patient ID: [name] [name], male    DOB: September 06, 1951, 65 y.o.   MRN: [id number]  HPI  65 year old patient who is seen today for evaluation of migraines.  He has seen Dr. Catalina Gravel in the past and has been relieved third for acupuncture and integrative therapy but has not been on any medical preventive strategy.  He has been on Lyrica in the past which was not very helpful.  This far as his migraines. He does get properly with Imitrex but takes frequently and has exceeded his monthly allotment from his present insurance plan.  He states that he has had migraines 14 out of the past 19 days requiring Imitrex.  He sleeps poorly  Past Medical History  Diagnosis Date  . ALLERGIC RHINITIS 04/22/2007  . HYPERCHOLESTEROLEMIA 04/22/2007  . HYPERLIPIDEMIA 07/29/2007  . NEPHROLITHIASIS, HX OF 07/29/2007  . Osteoarth NOS-Unspec 04/22/2007  . Headache(784.0)     migraines  . DDD (degenerative disc disease), cervical   . Shoulder pain, acute     bilateral  . History of kidney stones   . History of skin cancer   . GERD (gastroesophageal reflux disease)     infrequently  . History of TMJ disorder   . Complication of anesthesia     more concerned about positioning of head & neck because of TMJ  . Cancer ([place])     HX SKIN CANCER    Social History   Social History  . Marital Status: Married    Spouse Name: N/A  . Number of Children: N/A  . Years of Education: N/A   Occupational History  . Not on file.   Social History Main Topics  . Smoking status: Never Smoker   . Smokeless tobacco: Never Used  . Alcohol Use: No  . Drug Use: No  . Sexual Activity:    Partners: Female   Other Topics Concern  . Not on file   Social History Narrative    Past Surgical History  Procedure Laterality Date  . Appendectomy    . Knee arthroscopy      right x2  . Shoulder surgery      right  . Total knee arthroplasty Right 08/25/2013    Procedure: RIGHT TOTAL KNEE ARTHROPLASTY;  Surgeon:  Gearlean Alf, MD;  Location: [facility];  Service: Orthopedics;  Laterality: Right;  . Joint replacement    . Total shoulder arthroplasty Left 12/25/2013    DR SUPPLE   . Total shoulder arthroplasty Left 12/25/2013    Procedure: LEFT TOTAL SHOULDER ARTHROPLASTY;  Surgeon: Marin Shutter, MD;  Location: [place];  Service: Orthopedics;  Laterality: Left;  . Total shoulder arthroplasty Right 06/18/2014    DR SUPPLE  . Total shoulder arthroplasty Right 06/18/2014    Procedure: RIGHT TOTAL SHOULDER ARTHROPLASTY;  Surgeon: Marin Shutter, MD;  Location: [place];  Service: Orthopedics;  Laterality: Right;    Family History  Problem Relation Age of Onset  . COPD Father     family hx  . Aneurysm Mother   . Cancer Maternal Grandfather     lung    Allergies  Allergen Reactions  . Red Dye Anaphylaxis    CAN TAKE DYE FREE BENADRYL  . Shrimp [Shellfish Allergy] Anaphylaxis and Rash  . Codeine Rash and Other (See Comments)    Blacked out  . Diazepam Rash    Rash over whole body from neck down, and he lost his voice (airway compromise?)  . Amoxicillin  Other (See Comments)    Unknown   . Erythromycin Other (See Comments)    unknown  . Betadine [Povidone Iodine] Rash  . Celebrex [Celecoxib] Rash  . Meperidine And Related Rash  . Methocarbamol Rash  . Oxycodone Rash  . Penicillins Rash  . Plavix [Clopidogrel] Rash  . Rivaroxaban Rash  . Vioxx [Rofecoxib] Rash    Current Outpatient Prescriptions on File Prior to Visit  Medication Sig Dispense Refill  . allopurinol (ZYLOPRIM) 100 MG tablet Take 1 tablet (100 mg total) by mouth daily. 90 tablet 1  . aspirin 81 MG tablet Take 81 mg by mouth daily.    . Cholecalciferol (VITAMIN D PO) Take 1 tablet by mouth daily.    . diazepam (VALIUM) 5 MG tablet Take 0.5-1 tablets (2.5-5 mg total) by mouth every 6 (six) hours as needed for muscle spasms or sedation. 40 tablet 1  . Multiple Vitamin (MULTIVITAMIN) tablet Take 1 tablet by mouth daily.    . Omega-3  Fatty Acids (FISH OIL) 1000 MG CAPS Take 1 capsule by mouth daily.    . pravastatin (PRAVACHOL) 40 MG tablet Take 1 tablet (40 mg total) by mouth daily. 90 tablet 3  . pregabalin (LYRICA) 75 MG capsule Take 75-150 mg by mouth 2 (two) times daily. Pt takes 150mg  in a.m. and 75mg  in p.m.    Marland Kitchen Psyllium (METAMUCIL PO) Take 2 tablets by mouth 2 (two) times daily.    . Red Yeast Rice Extract (RED YEAST RICE PO) Take 1 tablet by mouth daily.    . SUMAtriptan (IMITREX) 100 MG tablet Take 1 tablet (100 mg total) by mouth every 2 (two) hours as needed for migraine or headache. 10 tablet 5  . TURMERIC CURCUMIN PO Take 1,050 mg by mouth daily.    . valACYclovir (VALTREX) 500 MG tablet Take 500 mg by mouth daily.    . vitamin C (ASCORBIC ACID) 500 MG tablet Take 500 mg by mouth daily.    . traMADol (ULTRAM) 50 MG tablet Take 1 tablet (50 mg total) by mouth every 6 (six) hours as needed for moderate pain. (Patient not taking: Reported on 10/14/2015) 50 tablet 1   No current facility-administered medications on file prior to visit.    BP 120/86 mmHg  Pulse 84  Temp(Src) 97.8 F (36.6 C) (Oral)  Resp 20  Ht 5\' 9"  (1.753 m)  Wt 203 lb (92.08 kg)  BMI 29.96 kg/m2  SpO2 98%     Review of Systems  Constitutional: Negative for fever, chills, appetite change and fatigue.  HENT: Negative for congestion, dental problem, ear pain, hearing loss, sore throat, tinnitus, trouble swallowing and voice change.   Eyes: Negative for pain, discharge and visual disturbance.  Respiratory: Negative for cough, chest tightness, wheezing and stridor.   Cardiovascular: Negative for chest pain, palpitations and leg swelling.  Gastrointestinal: Negative for nausea, vomiting, abdominal pain, diarrhea, constipation, blood in stool and abdominal distention.  Genitourinary: Negative for urgency, hematuria, flank pain, discharge, difficulty urinating and genital sores.  Musculoskeletal: Negative for myalgias, back pain, joint  swelling, arthralgias, gait problem and neck stiffness.  Skin: Negative for rash.  Neurological: Positive for headaches. Negative for dizziness, syncope, speech difficulty, weakness and numbness.  Hematological: Negative for adenopathy. Does not bruise/bleed easily.  Psychiatric/Behavioral: Positive for sleep disturbance. Negative for behavioral problems and dysphoric mood. The patient is nervous/anxious.        Objective:   Physical Exam  Constitutional: He appears well-developed and well-nourished. No distress.  Anxious  Skin:  Well-healed laminectomy scar          Assessment & Plan:   Migraine headaches.  Will give a trial of Imitrex.  Recheck 1 month Advanced osteoarthritis status post recent laminectomy Dyslipidemia.  Continue statin therapy

## 2015-10-14 NOTE — Patient Instructions (Signed)
Imitrex.  Maximum dose to per 24 hours  Amitriptyline 25 mg at bedtime; may increase by 25 mg increments every 7 days to a maximal dose of 100 mg at bedtime  Return in one month for follow-up

## 2015-11-08 DIAGNOSIS — Z981 Arthrodesis status: Secondary | ICD-10-CM | POA: Diagnosis not present

## 2015-11-08 DIAGNOSIS — M545 Low back pain: Secondary | ICD-10-CM | POA: Diagnosis not present

## 2015-11-11 ENCOUNTER — Encounter: Payer: Self-pay | Admitting: Internal Medicine

## 2015-11-11 ENCOUNTER — Ambulatory Visit (INDEPENDENT_AMBULATORY_CARE_PROVIDER_SITE_OTHER): Payer: Commercial Managed Care - HMO | Admitting: Internal Medicine

## 2015-11-11 VITALS — BP 98/60 | HR 95 | Temp 98.6°F | Resp 20 | Ht 69.0 in | Wt 210.0 lb

## 2015-11-11 DIAGNOSIS — M150 Primary generalized (osteo)arthritis: Secondary | ICD-10-CM

## 2015-11-11 DIAGNOSIS — G43009 Migraine without aura, not intractable, without status migrainosus: Secondary | ICD-10-CM | POA: Diagnosis not present

## 2015-11-11 DIAGNOSIS — M159 Polyosteoarthritis, unspecified: Secondary | ICD-10-CM

## 2015-11-11 NOTE — Progress Notes (Signed)
Subjective:    Patient ID: [name] [name], male    DOB: 1951-04-14, 65 y.o.   MRN: [id number]  HPI  65 year old patient who is seen today in follow-up with migraine headaches.  One month ago he was placed on amitriptyline and he has uptitrated the dose to 50 mg at bedtime.  He has done much better and is sleeping better.  He has noted a nice decrease frequency and migraine attacks.  He has significant arthritis including cervical DJD. He has been treated recently with prednisone and a muscle relaxer. He states that he has felt better following his back surgery, then he has in a number of years  Past Medical History  Diagnosis Date  . ALLERGIC RHINITIS 04/22/2007  . HYPERCHOLESTEROLEMIA 04/22/2007  . HYPERLIPIDEMIA 07/29/2007  . NEPHROLITHIASIS, HX OF 07/29/2007  . Osteoarth NOS-Unspec 04/22/2007  . Headache(784.0)     migraines  . DDD (degenerative disc disease), cervical   . Shoulder pain, acute     bilateral  . History of kidney stones   . History of skin cancer   . GERD (gastroesophageal reflux disease)     infrequently  . History of TMJ disorder   . Complication of anesthesia     more concerned about positioning of head & neck because of TMJ  . Cancer ([place])     HX SKIN CANCER    Social History   Social History  . Marital Status: Married    Spouse Name: N/A  . Number of Children: N/A  . Years of Education: N/A   Occupational History  . Not on file.   Social History Main Topics  . Smoking status: Never Smoker   . Smokeless tobacco: Never Used  . Alcohol Use: No  . Drug Use: No  . Sexual Activity:    Partners: Female   Other Topics Concern  . Not on file   Social History Narrative    Past Surgical History  Procedure Laterality Date  . Appendectomy    . Knee arthroscopy      right x2  . Shoulder surgery      right  . Total knee arthroplasty Right 08/25/2013    Procedure: RIGHT TOTAL KNEE ARTHROPLASTY;  Surgeon: Gearlean Alf, MD;  [facility]: [facility];   Service: Orthopedics;  Laterality: Right;  . Joint replacement    . Total shoulder arthroplasty Left 12/25/2013    DR SUPPLE   . Total shoulder arthroplasty Left 12/25/2013    Procedure: LEFT TOTAL SHOULDER ARTHROPLASTY;  Surgeon: Marin Shutter, MD;  Location: [place];  Service: Orthopedics;  Laterality: Left;  . Total shoulder arthroplasty Right 06/18/2014    DR SUPPLE  . Total shoulder arthroplasty Right 06/18/2014    Procedure: RIGHT TOTAL SHOULDER ARTHROPLASTY;  Surgeon: Marin Shutter, MD;  Location: [place];  Service: Orthopedics;  Laterality: Right;    Family History  Problem Relation Age of Onset  . COPD Father     family hx  . Aneurysm Mother   . Cancer Maternal Grandfather     lung    Allergies  Allergen Reactions  . Contrast Media [Iodinated Diagnostic Agents] Rash  . Shrimp [Shellfish Allergy] Anaphylaxis and Rash  . Codeine Rash and Other (See Comments)    Blacked out  . Diazepam Rash    Rash over whole body from neck down, and he lost his voice (airway compromise?)  . Amoxicillin Other (See Comments)    Unknown   . Erythromycin Other (See Comments)  unknown  . Betadine [Povidone Iodine] Rash  . Celebrex [Celecoxib] Rash  . Meperidine And Related Rash  . Methocarbamol Rash  . Oxycodone Rash  . Penicillins Rash  . Plavix [Clopidogrel] Rash  . Rivaroxaban Rash  . Vioxx [Rofecoxib] Rash    Current Outpatient Prescriptions on File Prior to Visit  Medication Sig Dispense Refill  . allopurinol (ZYLOPRIM) 100 MG tablet Take 1 tablet (100 mg total) by mouth daily. 90 tablet 1  . amitriptyline (ELAVIL) 25 MG tablet Take 1 tablet (25 mg total) by mouth at bedtime. 90 tablet 2  . aspirin 81 MG tablet Take 81 mg by mouth daily.    . Cholecalciferol (VITAMIN D PO) Take 1 tablet by mouth daily.    . Multiple Vitamin (MULTIVITAMIN) tablet Take 1 tablet by mouth daily.    . Omega-3 Fatty Acids (FISH OIL) 1000 MG CAPS Take 1 capsule by mouth daily.    . pravastatin  (PRAVACHOL) 40 MG tablet Take 1 tablet (40 mg total) by mouth daily. 90 tablet 3  . Psyllium (METAMUCIL PO) Take 2 tablets by mouth 2 (two) times daily.    . Red Yeast Rice Extract (RED YEAST RICE PO) Take 1 tablet by mouth daily.    . SUMAtriptan (IMITREX) 100 MG tablet Take 1 tablet (100 mg total) by mouth every 2 (two) hours as needed for migraine or headache. 10 tablet 5  . TURMERIC CURCUMIN PO Take 1,050 mg by mouth daily.    . valACYclovir (VALTREX) 500 MG tablet Take 500 mg by mouth daily.    . vitamin C (ASCORBIC ACID) 500 MG tablet Take 500 mg by mouth daily.     No current facility-administered medications on file prior to visit.    BP 98/60 mmHg  Pulse 95  Temp(Src) 98.6 F (37 C) (Oral)  Resp 20  Ht 5\' 9"  (1.753 m)  Wt 210 lb (95.255 kg)  BMI 31.00 kg/m2  SpO2 98%     Review of Systems  Constitutional: Negative for fever, chills, appetite change and fatigue.  HENT: Negative for congestion, dental problem, ear pain, hearing loss, sore throat, tinnitus, trouble swallowing and voice change.   Eyes: Negative for pain, discharge and visual disturbance.  Respiratory: Negative for cough, chest tightness, wheezing and stridor.   Cardiovascular: Negative for chest pain, palpitations and leg swelling.  Gastrointestinal: Negative for nausea, vomiting, abdominal pain, diarrhea, constipation, blood in stool and abdominal distention.  Genitourinary: Negative for urgency, hematuria, flank pain, discharge, difficulty urinating and genital sores.  Musculoskeletal: Positive for back pain and arthralgias. Negative for myalgias, joint swelling, gait problem and neck stiffness.  Skin: Negative for rash.  Neurological: Positive for headaches. Negative for dizziness, syncope, speech difficulty, weakness and numbness.  Hematological: Negative for adenopathy. Does not bruise/bleed easily.  Psychiatric/Behavioral: Negative for behavioral problems and dysphoric mood. The patient is not  nervous/anxious.        Objective:   Physical Exam  Constitutional: He appears well-developed and well-nourished. No distress.  Blood pressure low normal          Assessment & Plan:   Migraine headaches.  Improved.  Will continue present regimen Osteoarthritis  Recheck 6 months or as needed

## 2015-11-11 NOTE — Progress Notes (Signed)
Pre visit review using our clinic review tool, if applicable. No additional management support is needed unless otherwise documented below in the visit note. 

## 2015-11-11 NOTE — Patient Instructions (Signed)
It is important that you exercise regularly, at least 20 minutes 3 to 4 times per week.  If you develop chest pain or shortness of breath seek  medical attention.   

## 2015-12-13 ENCOUNTER — Other Ambulatory Visit: Payer: Self-pay | Admitting: Internal Medicine

## 2015-12-13 MED ORDER — AMITRIPTYLINE HCL 25 MG PO TABS: 25.0000 mg | ORAL_TABLET | Freq: Every day | ORAL | Status: DC

## 2015-12-13 MED ORDER — ALLOPURINOL 100 MG PO TABS: 100.0000 mg | ORAL_TABLET | Freq: Every day | ORAL | Status: DC

## 2015-12-13 MED ORDER — PRAVASTATIN SODIUM 40 MG PO TABS: 40.0000 mg | ORAL_TABLET | Freq: Every day | ORAL | Status: DC

## 2015-12-13 MED ORDER — SUMATRIPTAN SUCCINATE 100 MG PO TABS
100.0000 mg | ORAL_TABLET | ORAL | Status: DC | PRN
Start: 2015-12-13 — End: 2016-07-24

## 2015-12-15 ENCOUNTER — Other Ambulatory Visit: Payer: Self-pay | Admitting: *Deleted

## 2015-12-15 MED ORDER — AMITRIPTYLINE HCL 25 MG PO TABS: 25.0000 mg | ORAL_TABLET | Freq: Every day | ORAL | Status: DC

## 2015-12-15 MED ORDER — ALLOPURINOL 100 MG PO TABS: 100.0000 mg | ORAL_TABLET | Freq: Every day | ORAL | Status: DC

## 2015-12-22 DIAGNOSIS — Z981 Arthrodesis status: Secondary | ICD-10-CM | POA: Diagnosis not present

## 2015-12-22 DIAGNOSIS — M545 Low back pain: Secondary | ICD-10-CM | POA: Diagnosis not present

## 2016-01-07 ENCOUNTER — Encounter: Payer: Self-pay | Admitting: Internal Medicine

## 2016-01-10 ENCOUNTER — Other Ambulatory Visit: Payer: Self-pay | Admitting: Internal Medicine

## 2016-01-10 MED ORDER — AMITRIPTYLINE HCL 25 MG PO TABS: ORAL_TABLET | ORAL | Status: DC

## 2016-01-12 ENCOUNTER — Other Ambulatory Visit: Payer: Self-pay | Admitting: Internal Medicine

## 2016-01-12 MED ORDER — AMITRIPTYLINE HCL 25 MG PO TABS: ORAL_TABLET | ORAL | Status: DC

## 2016-01-13 ENCOUNTER — Telehealth: Payer: Self-pay | Admitting: *Deleted

## 2016-01-13 NOTE — Telephone Encounter (Signed)
[place] and spoke to [place] and she transferred me to a pharmacist.[place], told him okay to fill Rx for Amitriptyline. Pt has been taking Amitriptyline and tolerating it without any problems. Vicente Serene verbalized understanding and will fill Rx.

## 2016-01-13 NOTE — Telephone Encounter (Signed)
[facility] Message: New Rx for amitriptyline 25 mg tab.  Patient reports flexeril allergy and there is a potential for cross-sensitivity.  Please confirm if ok to fill. Phone [phone] Fax [phone]

## 2016-02-22 DIAGNOSIS — C44519 Basal cell carcinoma of skin of other part of trunk: Secondary | ICD-10-CM | POA: Diagnosis not present

## 2016-02-28 ENCOUNTER — Ambulatory Visit (INDEPENDENT_AMBULATORY_CARE_PROVIDER_SITE_OTHER): Payer: Commercial Managed Care - HMO | Admitting: Internal Medicine

## 2016-02-28 ENCOUNTER — Encounter: Payer: Self-pay | Admitting: Internal Medicine

## 2016-02-28 VITALS — BP 110/80 | HR 77 | Temp 98.4°F | Resp 20 | Ht 69.0 in | Wt 195.0 lb

## 2016-02-28 DIAGNOSIS — M79672 Pain in left foot: Secondary | ICD-10-CM | POA: Diagnosis not present

## 2016-02-28 DIAGNOSIS — C4491 Basal cell carcinoma of skin, unspecified: Secondary | ICD-10-CM | POA: Diagnosis not present

## 2016-02-28 NOTE — Progress Notes (Signed)
Subjective:    Patient ID: [name] [name], male    DOB: 1951/04/03, 65 y.o.   MRN: [id number]  HPI  Wt Readings from Last 3 Encounters:  02/28/16 195 lb (88.451 kg)  11/11/15 210 lb (95.255 kg)  10/14/15 203 lb (92.62 kg)   65 year old patient who presents with a ten-day history of pain involving the dorsal aspect of his left distal foot.  The patient has been relatively immobilized for some time but has done much better after, back and knee surgery.  For the past several weeks he has been walking 10-15 miles per day.  Due to foot pain.  There has been no walking for the past 10 days but the foot remains quite painful.  Past Medical History  Diagnosis Date  . ALLERGIC RHINITIS 04/22/2007  . HYPERCHOLESTEROLEMIA 04/22/2007  . HYPERLIPIDEMIA 07/29/2007  . NEPHROLITHIASIS, HX OF 07/29/2007  . Osteoarth NOS-Unspec 04/22/2007  . Headache(784.0)     migraines  . DDD (degenerative disc disease), cervical   . Shoulder pain, acute     bilateral  . History of kidney stones   . History of skin cancer   . GERD (gastroesophageal reflux disease)     infrequently  . History of TMJ disorder   . Complication of anesthesia     more concerned about positioning of head & neck because of TMJ  . Cancer ([place])     HX SKIN CANCER     Social History   Social History  . Marital Status: Married    Spouse Name: N/A  . Number of Children: N/A  . Years of Education: N/A   Occupational History  . Not on file.   Social History Main Topics  . Smoking status: Never Smoker   . Smokeless tobacco: Never Used  . Alcohol Use: No  . Drug Use: No  . Sexual Activity:    Partners: Female   Other Topics Concern  . Not on file   Social History Narrative    Past Surgical History  Procedure Laterality Date  . Appendectomy    . Knee arthroscopy      right x2  . Shoulder surgery      right  . Total knee arthroplasty Right 08/25/2013    Procedure: RIGHT TOTAL KNEE ARTHROPLASTY;  Surgeon: Gearlean Alf, MD;  Location: [facility];  Service: Orthopedics;  Laterality: Right;  . Joint replacement    . Total shoulder arthroplasty Left 12/25/2013    DR SUPPLE   . Total shoulder arthroplasty Left 12/25/2013    Procedure: LEFT TOTAL SHOULDER ARTHROPLASTY;  Surgeon: Marin Shutter, MD;  Location: [place];  Service: Orthopedics;  Laterality: Left;  . Total shoulder arthroplasty Right 06/18/2014    DR SUPPLE  . Total shoulder arthroplasty Right 06/18/2014    Procedure: RIGHT TOTAL SHOULDER ARTHROPLASTY;  Surgeon: Marin Shutter, MD;  Location: [place];  Service: Orthopedics;  Laterality: Right;    Family History  Problem Relation Age of Onset  . COPD Father     family hx  . Aneurysm Mother   . Cancer Maternal Grandfather     lung    Allergies  Allergen Reactions  . Contrast Media [Iodinated Diagnostic Agents] Rash  . Shrimp [Shellfish Allergy] Anaphylaxis and Rash  . Codeine Rash and Other (See Comments)    Blacked out  . Diazepam Rash    Rash over whole body from neck down, and he lost his voice (airway compromise?)  . Amoxicillin Other (See  Comments)    Unknown   . Erythromycin Other (See Comments)    unknown  . Betadine [Povidone Iodine] Rash  . Celebrex [Celecoxib] Rash  . Meperidine And Related Rash  . Methocarbamol Rash  . Oxycodone Rash  . Penicillins Rash  . Plavix [Clopidogrel] Rash  . Rivaroxaban Rash  . Vioxx [Rofecoxib] Rash    Current Outpatient Prescriptions on File Prior to Visit  Medication Sig Dispense Refill  . allopurinol (ZYLOPRIM) 100 MG tablet Take 1 tablet (100 mg total) by mouth daily. 90 tablet 3  . amitriptyline (ELAVIL) 25 MG tablet 1 tablet 3 times daily 270 tablet 3  . aspirin 81 MG tablet Take 81 mg by mouth daily.    . Cholecalciferol (VITAMIN D PO) Take 1 tablet by mouth daily.    . Multiple Vitamin (MULTIVITAMIN) tablet Take 1 tablet by mouth daily.    . Omega-3 Fatty Acids (FISH OIL) 1000 MG CAPS Take 1 capsule by mouth daily.    . pravastatin  (PRAVACHOL) 40 MG tablet Take 1 tablet (40 mg total) by mouth daily. 90 tablet 3  . Psyllium (METAMUCIL PO) Take 2 tablets by mouth 2 (two) times daily.    . Red Yeast Rice Extract (RED YEAST RICE PO) Take 1 tablet by mouth daily.    . SUMAtriptan (IMITREX) 100 MG tablet Take 1 tablet (100 mg total) by mouth every 2 (two) hours as needed for migraine or headache. 10 tablet 5  . tiZANidine (ZANAFLEX) 4 MG capsule Take 4 mg by mouth 3 (three) times daily as needed.     . TURMERIC CURCUMIN PO Take 1,050 mg by mouth daily.    . valACYclovir (VALTREX) 500 MG tablet Take 500 mg by mouth daily.    . vitamin C (ASCORBIC ACID) 500 MG tablet Take 500 mg by mouth daily.     No current facility-administered medications on file prior to visit.    BP 110/80 mmHg  Pulse 77  Temp(Src) 98.4 F (36.9 C) (Oral)  Resp 20  Ht 5\' 9"  (1.753 m)  Wt 195 lb (88.451 kg)  BMI 28.78 kg/m2  SpO2 98%    Review of Systems  Constitutional: Negative for fever, chills, appetite change and fatigue.  HENT: Negative for congestion, dental problem, ear pain, hearing loss, sore throat, tinnitus, trouble swallowing and voice change.   Eyes: Negative for pain, discharge and visual disturbance.  Respiratory: Negative for cough, chest tightness, wheezing and stridor.   Cardiovascular: Negative for chest pain, palpitations and leg swelling.  Gastrointestinal: Negative for nausea, vomiting, abdominal pain, diarrhea, constipation, blood in stool and abdominal distention.  Genitourinary: Negative for urgency, hematuria, flank pain, discharge, difficulty urinating and genital sores.  Musculoskeletal: Positive for arthralgias and gait problem. Negative for myalgias, back pain, joint swelling and neck stiffness.       Left foot pain  Skin: Negative for rash.  Neurological: Negative for dizziness, syncope, speech difficulty, weakness, numbness and headaches.  Hematological: Negative for adenopathy. Does not bruise/bleed easily.    Psychiatric/Behavioral: Negative for behavioral problems and dysphoric mood. The patient is not nervous/anxious.        Objective:   Physical Exam  Constitutional: He appears well-developed and well-nourished. He appears distressed.  Musculoskeletal:  Tenderness along the dorsal aspect of the foot at the base of the toes Very little tenderness over the metatarsal heads when palpated along the plantar surface Slight pain with flexion of the toes  No redness, erythema or soft tissue swelling  Assessment & Plan:   Probable stress fracture.  One or more metatarsal heads.  Cannot rule out tendinitis.  Will placed in a walking shoe, and limit activities.  He is requesting orthopedic referral.  Will schedule Skin cancer right upper anterior chest wall.  Dermatology referral sent  Nyoka Cowden, MD

## 2016-02-28 NOTE — Patient Instructions (Signed)
Stress Fracture Stress fracture is a small break or crack in a bone. A stress fracture can be fully broken (complete) or partially broken (incomplete). The most common sites for stress fractures are the bones in the front of your feet (metatarsals), your heels (calcaneus), and the long bone of your lower leg (tibia). CAUSES A stress fracture is caused by overuse or repetitive exercise, such as running. It happens when a bone cannot absorb any more shock because the muscles around it are weak. Stress fractures happen most commonly when:  You rapidly increase or start a new physical activity.  You use shoes that are worn out or do not fit you properly.  You exercise on a new surface. RISK FACTORS You may be at higher risk for this type of fracture if:  You have a condition that causes weak bones (osteoporosis).  You are male. Stress fractures are more likely to occur in women. SIGNS AND SYMPTOMS The most common symptom of a stress fracture is feeling pain when you are using the affected part of your body. The pain usually goes away when you are resting. Other symptoms may include:  Swelling of the affected area.  Pain in the area when it is touched.  Decreased pain while resting. Stress fracture pain usually develops over time. DIAGNOSIS Diagnosis may include:   Medical history and physical exam.  X-rays.  Bone scan.  MRI. TREATMENT Treatment depends on the severity of your stress fracture. Treatment usually involves resting, icing, compression, and elevation (RICE) of the affected part of your body. Treatment may also include:  Medicines to reduce inflammation.  A cast or a walking shoe.  Crutches.  Surgery. HOME CARE INSTRUCTIONS If You Have a Cast:  Do not stick anything inside the cast to scratch your skin. Doing that increases your risk of infection.  Check the skin around the cast every day. Report any concerns to your health care provider. You may put lotion  on dry skin around the edges of the cast. Do not apply lotion to the skin underneath the cast.  Keep the cast clean and dry.  Cover the cast with a watertight plastic bag to protect it from water while you take a bath or a shower. Do not let the cast get wet.  Do not put pressure on any part of the cast until it is fully hardened. This may take several hours. If You Have a Walking Shoe:  Wear it as directed by your health care provider. Managing Pain, Stiffness, and Swelling  If directed, apply ice to the injured area:  Put ice in a plastic bag.  Place a towel between your skin and the bag.  Leave the ice on for 20 minutes, 2-3 times per day.  Move your fingers or toes often to avoid stiffness and to lessen swelling.  Raise the injured area above the level of your heart while you are sitting or lying down. Activity  Rest as directed by your health care provider. Ask your health care provider if you may do alternative exercises, such as swimming or biking, while you are healing.  Return to your normal activities as directed by your health care provider. Ask your health care provider what activities are safe for you.  Perform range-of-motion exercises only as directed by your health care provider. Safety  Do not use the injured limb to support yourbody weight until your health care provider says that you can. Use crutches if your health care provider tells you to   do so. General Instructions  Do not use any tobacco products, including cigarettes, chewing tobacco, or electronic cigarettes. Tobacco can delay bone healing. If you need help quitting, ask your health care provider.  Take medicines only as directed by your health care provider.  Keep all follow-up visits as directed by your health care provider. This is important. PREVENTION  Only wear shoes that:  Fit well.  Are not worn out.  Eat a healthy diet that contains vitamin D and calcium. This helps keeps your bones  strong.  Be careful when you start a new physical activity. Give your body time to adjust.  Avoid doing only one kind of activity. Do different exercises, such as swimming and running, so that no single part of your body gets overused.  Do strength-training exercises. SEEK MEDICAL CARE IF:  Your pain gets worse.  You have new symptoms.  You have increased swelling. SEEK IMMEDIATE MEDICAL CARE IF:   You lose feeling in the affected area.   This information is not intended to replace advice given to you by your health care provider. Make sure you discuss any questions you have with your health care provider.   Document Released: 12/02/2002 Document Revised: 10/02/2014 Document Reviewed: 04/16/2014 Elsevier Interactive Patient Education 2016 [employer]  

## 2016-02-28 NOTE — Progress Notes (Signed)
Pre visit review using our clinic review tool, if applicable. No additional management support is needed unless otherwise documented below in the visit note. 

## 2016-02-29 ENCOUNTER — Encounter: Payer: Self-pay | Admitting: Internal Medicine

## 2016-02-29 DIAGNOSIS — M25572 Pain in left ankle and joints of left foot: Secondary | ICD-10-CM | POA: Diagnosis not present

## 2016-03-04 DIAGNOSIS — M25572 Pain in left ankle and joints of left foot: Secondary | ICD-10-CM | POA: Diagnosis not present

## 2016-04-11 DIAGNOSIS — M25572 Pain in left ankle and joints of left foot: Secondary | ICD-10-CM | POA: Diagnosis not present

## 2016-04-13 DIAGNOSIS — C44612 Basal cell carcinoma of skin of right upper limb, including shoulder: Secondary | ICD-10-CM | POA: Diagnosis not present

## 2016-05-23 DIAGNOSIS — M25571 Pain in right ankle and joints of right foot: Secondary | ICD-10-CM | POA: Diagnosis not present

## 2016-05-23 DIAGNOSIS — M25572 Pain in left ankle and joints of left foot: Secondary | ICD-10-CM | POA: Diagnosis not present

## 2016-06-02 ENCOUNTER — Ambulatory Visit (INDEPENDENT_AMBULATORY_CARE_PROVIDER_SITE_OTHER): Payer: Commercial Managed Care - HMO | Admitting: *Deleted

## 2016-06-02 DIAGNOSIS — Z23 Encounter for immunization: Secondary | ICD-10-CM | POA: Diagnosis not present

## 2016-06-28 ENCOUNTER — Encounter: Payer: Self-pay | Admitting: Internal Medicine

## 2016-06-30 ENCOUNTER — Ambulatory Visit (INDEPENDENT_AMBULATORY_CARE_PROVIDER_SITE_OTHER): Payer: Commercial Managed Care - HMO | Admitting: *Deleted

## 2016-06-30 DIAGNOSIS — Z23 Encounter for immunization: Secondary | ICD-10-CM

## 2016-07-18 ENCOUNTER — Ambulatory Visit: Payer: Commercial Managed Care - HMO | Admitting: *Deleted

## 2016-07-18 DIAGNOSIS — M419 Scoliosis, unspecified: Secondary | ICD-10-CM | POA: Diagnosis not present

## 2016-07-18 DIAGNOSIS — Z981 Arthrodesis status: Secondary | ICD-10-CM | POA: Diagnosis not present

## 2016-07-18 DIAGNOSIS — M4316 Spondylolisthesis, lumbar region: Secondary | ICD-10-CM | POA: Diagnosis not present

## 2016-07-18 DIAGNOSIS — M545 Low back pain: Secondary | ICD-10-CM | POA: Diagnosis not present

## 2016-07-18 DIAGNOSIS — Z4789 Encounter for other orthopedic aftercare: Secondary | ICD-10-CM | POA: Diagnosis not present

## 2016-07-18 DIAGNOSIS — M47896 Other spondylosis, lumbar region: Secondary | ICD-10-CM | POA: Diagnosis not present

## 2016-07-24 ENCOUNTER — Encounter: Payer: Self-pay | Admitting: Internal Medicine

## 2016-07-24 ENCOUNTER — Other Ambulatory Visit: Payer: Self-pay | Admitting: Internal Medicine

## 2016-07-24 NOTE — Telephone Encounter (Signed)
Spoke to pt, told him I do not have invoices from the vist for him and his wife you will need to contact billing. I can print off immunization record for your wife and you that show what shots you had done that date and put them in the mail for you. Pt verbalized understanding and stated that would be fine. Told him okay will mail tomorrow.

## 2016-07-25 ENCOUNTER — Encounter: Payer: Self-pay | Admitting: Gastroenterology

## 2016-08-07 ENCOUNTER — Encounter: Payer: Self-pay | Admitting: Gastroenterology

## 2016-08-16 DIAGNOSIS — M5136 Other intervertebral disc degeneration, lumbar region: Secondary | ICD-10-CM | POA: Diagnosis not present

## 2016-08-16 DIAGNOSIS — M4150 Other secondary scoliosis, site unspecified: Secondary | ICD-10-CM | POA: Diagnosis not present

## 2016-08-16 DIAGNOSIS — Z981 Arthrodesis status: Secondary | ICD-10-CM | POA: Diagnosis not present

## 2016-08-16 DIAGNOSIS — Z4789 Encounter for other orthopedic aftercare: Secondary | ICD-10-CM | POA: Diagnosis not present

## 2016-08-16 DIAGNOSIS — M545 Low back pain: Secondary | ICD-10-CM | POA: Diagnosis not present

## 2016-08-16 DIAGNOSIS — M4310 Spondylolisthesis, site unspecified: Secondary | ICD-10-CM | POA: Diagnosis not present

## 2016-09-06 ENCOUNTER — Telehealth: Payer: Self-pay | Admitting: Internal Medicine

## 2016-09-06 DIAGNOSIS — Z23 Encounter for immunization: Secondary | ICD-10-CM | POA: Diagnosis not present

## 2016-09-06 DIAGNOSIS — L821 Other seborrheic keratosis: Secondary | ICD-10-CM | POA: Diagnosis not present

## 2016-09-06 DIAGNOSIS — L570 Actinic keratosis: Secondary | ICD-10-CM | POA: Diagnosis not present

## 2016-09-06 NOTE — Telephone Encounter (Signed)
Patient came in today wanting to schedule appointment with Dr. Raliegh Ip for his migraines. Patient states that the medication that he is currently taking is not working. Patient prefers to see Dr. Raliegh Ip instead of another provider since Dr. Raliegh Ip is aware of everything that he has going on.   Contact Info: [phone]

## 2016-09-08 ENCOUNTER — Encounter: Payer: Self-pay | Admitting: Internal Medicine

## 2016-09-08 ENCOUNTER — Ambulatory Visit (INDEPENDENT_AMBULATORY_CARE_PROVIDER_SITE_OTHER): Payer: Commercial Managed Care - HMO | Admitting: Internal Medicine

## 2016-09-08 VITALS — BP 132/88 | HR 80 | Temp 98.5°F | Wt 207.0 lb

## 2016-09-08 DIAGNOSIS — G43001 Migraine without aura, not intractable, with status migrainosus: Secondary | ICD-10-CM

## 2016-09-08 DIAGNOSIS — M150 Primary generalized (osteo)arthritis: Secondary | ICD-10-CM

## 2016-09-08 DIAGNOSIS — M5416 Radiculopathy, lumbar region: Secondary | ICD-10-CM

## 2016-09-08 DIAGNOSIS — G8929 Other chronic pain: Secondary | ICD-10-CM

## 2016-09-08 DIAGNOSIS — M545 Low back pain: Secondary | ICD-10-CM | POA: Diagnosis not present

## 2016-09-08 DIAGNOSIS — M159 Polyosteoarthritis, unspecified: Secondary | ICD-10-CM

## 2016-09-08 MED ORDER — TOPIRAMATE 50 MG PO TABS: 50.0000 mg | ORAL_TABLET | Freq: Two times a day (BID) | ORAL | 0 refills | Status: DC

## 2016-09-08 MED ORDER — TOPIRAMATE 25 MG PO CPSP: 25.0000 mg | ORAL_CAPSULE | Freq: Two times a day (BID) | ORAL | 0 refills | Status: DC

## 2016-09-08 MED ORDER — TOPIRAMATE 100 MG PO TABS: 100.0000 mg | ORAL_TABLET | Freq: Two times a day (BID) | ORAL | 1 refills | Status: DC

## 2016-09-08 NOTE — Progress Notes (Signed)
Pre visit review using our clinic review tool, if applicable. No additional management support is needed unless otherwise documented below in the visit note. 

## 2016-09-08 NOTE — Telephone Encounter (Signed)
Dr. Raliegh Ip will call pt this afternoon.

## 2016-09-08 NOTE — Telephone Encounter (Signed)
I think pt is wanting to be seen before Dr. Raliegh Ip goes out for the year.

## 2016-09-08 NOTE — Telephone Encounter (Signed)
Spoke with pt and he is wanting a referral for an epidural injection for his back, but is also concerned about his migraine medications not working. He has been having increase in frequency of headaches. Pt scheduled to see DrK this afternoon. Nothing further needed.

## 2016-09-08 NOTE — Progress Notes (Signed)
Subjective:    Patient ID: [name] [name], male    DOB: 1950/12/08, 65 y.o.   MRN: [id number]  HPI  65 year old patient who is seen today with 2 major concerns. He is status post lumbar fusion approximately 11 months ago.  Initially he did quite welland was walking up to 10 miles daily.  More recently has had recurrent low back pain.  He has been followed by Dr. Harl Bowie at [facility] who felt that he would benefit from a lumbar epidural. The patient wishes to schedule with [facility] imaging and referral is in progress Another complaint is worsening migraine headaches.  He has been evaluated by Dr. Melton Alar  in the past and Imitrex continues to be quite effective.  However, he is having several migraines per week and his insurance limits.  His quantities.  He has been on amitriptyline but migraines have intensified in spite of dose escalation of the amitriptyline.  Past Medical History:  Diagnosis Date  . ALLERGIC RHINITIS 04/22/2007  . Cancer ([facility])    HX SKIN CANCER  . Complication of anesthesia    more concerned about positioning of head & neck because of TMJ  . DDD (degenerative disc disease), cervical   . GERD (gastroesophageal reflux disease)    infrequently  . Headache(784.0)    migraines  . History of kidney stones   . History of skin cancer   . History of TMJ disorder   . HYPERCHOLESTEROLEMIA 04/22/2007  . HYPERLIPIDEMIA 07/29/2007  . Hyperplastic colon polyp   . NEPHROLITHIASIS, HX OF 07/29/2007  . Osteoarth NOS-Unspec 04/22/2007  . Shoulder pain, acute    bilateral     Social History   Social History  . Marital status: Married    Spouse name: N/A  . Number of children: N/A  . Years of education: N/A   Occupational History  . Not on file.   Social History Main Topics  . Smoking status: Never Smoker  . Smokeless tobacco: Never Used  . Alcohol use No  . Drug use: No  . Sexual activity: Yes    Partners: Female   Other Topics Concern  . Not on file    Social History Narrative  . No narrative on file    Past Surgical History:  Procedure Laterality Date  . APPENDECTOMY    . JOINT REPLACEMENT    . KNEE ARTHROSCOPY     right x2  . SHOULDER SURGERY     right  . TOTAL KNEE ARTHROPLASTY Right 08/25/2013   Procedure: RIGHT TOTAL KNEE ARTHROPLASTY;  Surgeon: Gearlean Alf, MD;  Location: [facility];  Service: Orthopedics;  Laterality: Right;  . TOTAL SHOULDER ARTHROPLASTY Left 12/25/2013   DR SUPPLE   . TOTAL SHOULDER ARTHROPLASTY Left 12/25/2013   Procedure: LEFT TOTAL SHOULDER ARTHROPLASTY;  Surgeon: Marin Shutter, MD;  Location: [place];  Service: Orthopedics;  Laterality: Left;  . TOTAL SHOULDER ARTHROPLASTY Right 06/18/2014   DR SUPPLE  . TOTAL SHOULDER ARTHROPLASTY Right 06/18/2014   Procedure: RIGHT TOTAL SHOULDER ARTHROPLASTY;  Surgeon: Marin Shutter, MD;  Location: [place];  Service: Orthopedics;  Laterality: Right;    Family History  Problem Relation Age of Onset  . COPD Father     family hx  . Aneurysm Mother   . Cancer Maternal Grandfather     lung    Allergies  Allergen Reactions  . Contrast Media [Iodinated Diagnostic Agents] Rash  . Shrimp [Shellfish Allergy] Anaphylaxis and Rash  . Codeine Rash and  Other (See Comments)    Blacked out  . Diazepam Rash    Rash over whole body from neck down, and he lost his voice (airway compromise?)  . Amoxicillin Other (See Comments)    Unknown   . Erythromycin Other (See Comments)    unknown  . Betadine [Povidone Iodine] Rash  . Celebrex [Celecoxib] Rash  . Meperidine And Related Rash  . Methocarbamol Rash  . Oxycodone Rash  . Penicillins Rash  . Plavix [Clopidogrel] Rash  . Rivaroxaban Rash  . Vioxx [Rofecoxib] Rash    Current Outpatient Prescriptions on File Prior to Visit  Medication Sig Dispense Refill  . allopurinol (ZYLOPRIM) 100 MG tablet Take 1 tablet (100 mg total) by mouth daily. 90 tablet 3  . amitriptyline (ELAVIL) 25 MG tablet TAKE 1 TABLET THREE TIMES  DAILY 270 tablet 3  . aspirin 81 MG tablet Take 81 mg by mouth daily.    . Cholecalciferol (VITAMIN D PO) Take 1 tablet by mouth daily.    . Multiple Vitamin (MULTIVITAMIN) tablet Take 1 tablet by mouth daily.    . Omega-3 Fatty Acids (FISH OIL) 1000 MG CAPS Take 1 capsule by mouth daily.    . pravastatin (PRAVACHOL) 40 MG tablet Take 1 tablet (40 mg total) by mouth daily. 90 tablet 3  . Psyllium (METAMUCIL PO) Take 2 tablets by mouth 2 (two) times daily.    . Red Yeast Rice Extract (RED YEAST RICE PO) Take 1 tablet by mouth daily.    . SUMAtriptan (IMITREX) 100 MG tablet TAKE 1 TABLET EVERY 2 HOURS AS NEEDED FOR MIGRAINE OR HEADACHE. 9 tablet 5  . tiZANidine (ZANAFLEX) 4 MG capsule Take 4 mg by mouth 3 (three) times daily as needed.     . TURMERIC CURCUMIN PO Take 1,050 mg by mouth daily.    . valACYclovir (VALTREX) 500 MG tablet Take 500 mg by mouth daily.    . vitamin C (ASCORBIC ACID) 500 MG tablet Take 500 mg by mouth daily.     No current facility-administered medications on file prior to visit.     BP 132/88 (BP Location: Left Arm, Patient Position: Sitting, Cuff Size: Normal)   Pulse 80   Temp 98.5 F (36.9 C) (Oral)   Wt 207 lb (93.9 kg)   SpO2 95%   BMI 30.57 kg/m     Review of Systems  Constitutional: Negative for appetite change, chills, fatigue and fever.  HENT: Negative for congestion, dental problem, ear pain, hearing loss, sore throat, tinnitus, trouble swallowing and voice change.   Eyes: Negative for pain, discharge and visual disturbance.  Respiratory: Negative for cough, chest tightness, wheezing and stridor.   Cardiovascular: Negative for chest pain, palpitations and leg swelling.  Gastrointestinal: Negative for abdominal distention, abdominal pain, blood in stool, constipation, diarrhea, nausea and vomiting.  Genitourinary: Negative for difficulty urinating, discharge, flank pain, genital sores, hematuria and urgency.  Musculoskeletal: Positive for back pain.  Negative for arthralgias, gait problem, joint swelling, myalgias and neck stiffness.  Skin: Negative for rash.  Neurological: Positive for headaches. Negative for dizziness, syncope, speech difficulty, weakness and numbness.  Hematological: Negative for adenopathy. Does not bruise/bleed easily.  Psychiatric/Behavioral: Negative for behavioral problems and dysphoric mood. The patient is not nervous/anxious.        Objective:   Physical Exam  Constitutional: He appears well-developed and well-nourished. No distress.  Blood pressure 130/80  Psychiatric: He has a normal mood and affect. His behavior is normal. Thought content normal.  Assessment & Plan:   Chronic low back pain.  Status post lumbar fusion.  A consult has been placed with [facility] imaging for epidural.  Their  Office will call the patient and hopefully this can be scheduled on December 18 per his request.  Office notes from Dr. Harl Bowie as well as EMR reports are available in EMR Refractory migraine headaches.  Continue to respond well to Imitrex but are not well controlled.  Will give a trial of Topamax.  Will begin at a dose of 25 mg once daily at bedtime.  We'll increase the dose weekly.  Recheck 2 months.  Consider neurology referral if not well, improved  Nyoka Cowden

## 2016-09-08 NOTE — Patient Instructions (Signed)
  Topamax therapy for migraine prevention:  Week 1: 25 mg at bedtime Week 2: 25 mg twice daily Week 3: 25 mg in the morning and 50 mg at bedtime Week 4: 50 mg twice daily Week 5: 50  mg in the morning 100 mg at bedtime Week 6: 100 mg twice daily  (maximum dose)  Okay to hold titration.  At lower dose.  If migraines, well-controlled  Return in 2 months for follow-up

## 2016-09-11 ENCOUNTER — Other Ambulatory Visit: Payer: Self-pay | Admitting: Internal Medicine

## 2016-09-11 ENCOUNTER — Telehealth: Payer: Self-pay | Admitting: Radiology

## 2016-09-11 DIAGNOSIS — M545 Low back pain, unspecified: Secondary | ICD-10-CM

## 2016-09-11 DIAGNOSIS — G8929 Other chronic pain: Secondary | ICD-10-CM

## 2016-09-11 NOTE — Telephone Encounter (Signed)
Left message re: 13 hour prep which was called in to [place].

## 2016-09-14 ENCOUNTER — Ambulatory Visit: Payer: Commercial Managed Care - HMO

## 2016-09-19 ENCOUNTER — Telehealth: Payer: Self-pay

## 2016-09-19 NOTE — Telephone Encounter (Signed)
PA denied. The preferred drugs are Naratriptan and rizatriptan tablets.

## 2016-09-19 NOTE — Telephone Encounter (Signed)
Received PA request for Imitrex 100mg  tablets. PA submitted & is pending. Key: [id number]

## 2016-09-21 ENCOUNTER — Ambulatory Visit
Admission: RE | Admit: 2016-09-21 | Discharge: 2016-09-21 | Disposition: A | Discharge status: Home / Self Care | Payer: Commercial Managed Care - HMO | Source: Ambulatory Visit | Attending: Internal Medicine | Admitting: Internal Medicine

## 2016-09-21 DIAGNOSIS — M545 Low back pain, unspecified: Secondary | ICD-10-CM

## 2016-09-21 DIAGNOSIS — G8929 Other chronic pain: Secondary | ICD-10-CM

## 2016-09-21 MED ORDER — IOPAMIDOL (ISOVUE-M 200) INJECTION 41%: 1.0000 mL | Freq: Once | INTRAMUSCULAR | Status: DC

## 2016-09-21 MED ORDER — METHYLPREDNISOLONE ACETATE 40 MG/ML INJ SUSP (RADIOLOG: 120.0000 mg | Freq: Once | INTRAMUSCULAR | Status: DC

## 2016-09-21 NOTE — Discharge Instructions (Signed)

## 2016-09-26 ENCOUNTER — Encounter: Payer: Self-pay | Admitting: Internal Medicine

## 2016-09-26 ENCOUNTER — Telehealth: Payer: Self-pay | Admitting: Internal Medicine

## 2016-09-26 ENCOUNTER — Other Ambulatory Visit: Payer: Self-pay

## 2016-09-26 DIAGNOSIS — G8929 Other chronic pain: Secondary | ICD-10-CM

## 2016-09-26 DIAGNOSIS — M545 Low back pain: Principal | ICD-10-CM

## 2016-09-26 NOTE — Telephone Encounter (Signed)
Please see message and advise 

## 2016-09-26 NOTE — Telephone Encounter (Signed)
[facility] need new order for epidural

## 2016-09-26 NOTE — Telephone Encounter (Signed)
Rizatriptan 10 mg  #20  1 tablet as needed for migraine headache.  May repeat in 4 hours , not to exceed 2 dosages in 24 hours, refill times 3

## 2016-09-27 ENCOUNTER — Other Ambulatory Visit: Payer: Self-pay | Admitting: Internal Medicine

## 2016-09-27 ENCOUNTER — Encounter: Payer: Self-pay | Admitting: Internal Medicine

## 2016-09-27 ENCOUNTER — Other Ambulatory Visit: Payer: Self-pay | Admitting: Sports Medicine

## 2016-09-27 DIAGNOSIS — G8929 Other chronic pain: Secondary | ICD-10-CM

## 2016-09-27 DIAGNOSIS — M545 Low back pain: Principal | ICD-10-CM

## 2016-09-27 NOTE — Telephone Encounter (Signed)
okay

## 2016-09-27 NOTE — Telephone Encounter (Signed)
Discussed with Dr.K My Chart message from pt saying he would like a letter to approve Imitrex for insurance. Dr. Raliegh Ip said he will write letter.

## 2016-09-27 NOTE — Telephone Encounter (Signed)
Order for epidural put in for [facility]. Pt called and informed. Pt verbalized understanding.

## 2016-09-27 NOTE — Telephone Encounter (Signed)
Spoke with pt and informed him that Dr Raliegh Ip wrote a letter for him and it is at the front desk waiting for pickup. Pt expressed understanding and that he will stop by today to pick it up.

## 2016-09-27 NOTE — Progress Notes (Signed)
13 hour prep called to [place], [facility], pt called to let him know it was there. Pt already  knew how to do prep.

## 2016-09-29 ENCOUNTER — Encounter: Payer: Self-pay | Admitting: Internal Medicine

## 2016-09-29 ENCOUNTER — Ambulatory Visit (INDEPENDENT_AMBULATORY_CARE_PROVIDER_SITE_OTHER): Payer: Commercial Managed Care - HMO | Admitting: Internal Medicine

## 2016-09-29 VITALS — BP 126/80 | HR 71 | Temp 98.0°F | Ht 69.0 in | Wt 201.5 lb

## 2016-09-29 DIAGNOSIS — M5416 Radiculopathy, lumbar region: Secondary | ICD-10-CM

## 2016-09-29 DIAGNOSIS — G43001 Migraine without aura, not intractable, with status migrainosus: Secondary | ICD-10-CM | POA: Diagnosis not present

## 2016-09-29 DIAGNOSIS — Z96611 Presence of right artificial shoulder joint: Secondary | ICD-10-CM | POA: Diagnosis not present

## 2016-09-29 MED ORDER — ZOLPIDEM TARTRATE 10 MG PO TABS: 10.0000 mg | ORAL_TABLET | Freq: Every evening | ORAL | 2 refills | Status: DC | PRN

## 2016-09-29 MED ORDER — RIZATRIPTAN BENZOATE 10 MG PO TABS: 10.0000 mg | ORAL_TABLET | ORAL | 4 refills | Status: DC | PRN

## 2016-09-29 NOTE — Patient Instructions (Signed)
Neurology follow-up as discussed

## 2016-09-29 NOTE — Progress Notes (Signed)
Subjective:    Patient ID: [name] [name], male    DOB: 10-Jul-1951, 66 y.o.   MRN: [id number]  HPI  66 year old patient who has a history of migraine headaches.  He has a very extensive orthopedic history and has had right shoulder surgery.  He has been quite frustrated due to refractory migraine headaches.  These respond nicely to sumatriptan, but his insurance company has switched to an alternate triptan and he is quite limited in his monthly supply.  He has also been quite frustrated about a lumbar epidural which will be finally performed next week. The patient has been under considerable stress as has his wife He has seen neurology in the past as well as sports medicine.  He wishes to see a different neurologist  He was given a trial of Topamax for migraine prevention, which he did not tolerate well and has recently discontinued  He has had significant insomnia and anxiety issues  Past Medical History:  Diagnosis Date  . ALLERGIC RHINITIS 04/22/2007  . Cancer (HCC)    HX SKIN CANCER  . Complication of anesthesia    more concerned about positioning of head & neck because of TMJ  . DDD (degenerative disc disease), cervical   . GERD (gastroesophageal reflux disease)    infrequently  . Headache(784.0)    migraines  . History of kidney stones   . History of skin cancer   . History of TMJ disorder   . HYPERCHOLESTEROLEMIA 04/22/2007  . HYPERLIPIDEMIA 07/29/2007  . Hyperplastic colon polyp   . NEPHROLITHIASIS, HX OF 07/29/2007  . Osteoarth NOS-Unspec 04/22/2007  . Shoulder pain, acute    bilateral     Social History   Social History  . Marital status: Married    Spouse name: N/A  . Number of children: N/A  . Years of education: N/A   Occupational History  . Not on file.   Social History Main Topics  . Smoking status: Never Smoker  . Smokeless tobacco: Never Used  . Alcohol use No  . Drug use: No  . Sexual activity: Yes    Partners: Female   Other Topics Concern    . Not on file   Social History Narrative  . No narrative on file    Past Surgical History:  Procedure Laterality Date  . APPENDECTOMY    . JOINT REPLACEMENT    . KNEE ARTHROSCOPY     right x2  . SHOULDER SURGERY     right  . TOTAL KNEE ARTHROPLASTY Right 08/25/2013   Procedure: RIGHT TOTAL KNEE ARTHROPLASTY;  Surgeon: Gearlean Alf, MD;  Location: [facility];  Service: Orthopedics;  Laterality: Right;  . TOTAL SHOULDER ARTHROPLASTY Left 12/25/2013   DR SUPPLE   . TOTAL SHOULDER ARTHROPLASTY Left 12/25/2013   Procedure: LEFT TOTAL SHOULDER ARTHROPLASTY;  Surgeon: Marin Shutter, MD;  Location: [facility];  Service: Orthopedics;  Laterality: Left;  . TOTAL SHOULDER ARTHROPLASTY Right 06/18/2014   DR SUPPLE  . TOTAL SHOULDER ARTHROPLASTY Right 06/18/2014   Procedure: RIGHT TOTAL SHOULDER ARTHROPLASTY;  Surgeon: Marin Shutter, MD;  Location: [facility];  Service: Orthopedics;  Laterality: Right;    Family History  Problem Relation Age of Onset  . COPD Father     family hx  . Aneurysm Mother   . Cancer Maternal Grandfather     lung    Allergies  Allergen Reactions  . Shrimp [Shellfish Allergy] Anaphylaxis and Rash  . Codeine Rash and Other (See Comments)  Blacked out  . Contrast Media [Iodinated Diagnostic Agents] Rash  . Diazepam Rash    Rash over whole body from neck down, and he lost his voice (airway compromise?)  . Amoxicillin Other (See Comments)    Unknown   . Erythromycin Other (See Comments)    unknown  . Betadine [Povidone Iodine] Rash  . Celebrex [Celecoxib] Rash  . Meperidine And Related Rash  . Methocarbamol Rash  . Oxycodone Rash  . Penicillins Rash  . Plavix [Clopidogrel] Rash  . Rivaroxaban Rash  . Vioxx [Rofecoxib] Rash    Current Outpatient Prescriptions on File Prior to Visit  Medication Sig Dispense Refill  . allopurinol (ZYLOPRIM) 100 MG tablet Take 1 tablet (100 mg total) by mouth daily. 90 tablet 3  . amitriptyline (ELAVIL) 25 MG tablet TAKE 1 TABLET  THREE TIMES DAILY 270 tablet 3  . aspirin 81 MG tablet Take 81 mg by mouth daily.    . Cholecalciferol (VITAMIN D PO) Take 1 tablet by mouth daily.    . Multiple Vitamin (MULTIVITAMIN) tablet Take 1 tablet by mouth daily.    . Omega-3 Fatty Acids (FISH OIL) 1000 MG CAPS Take 1 capsule by mouth daily.    . pravastatin (PRAVACHOL) 40 MG tablet Take 1 tablet (40 mg total) by mouth daily. 90 tablet 3  . Psyllium (METAMUCIL PO) Take 2 tablets by mouth 2 (two) times daily.    . Red Yeast Rice Extract (RED YEAST RICE PO) Take 1 tablet by mouth daily.    . SUMAtriptan (IMITREX) 100 MG tablet TAKE 1 TABLET EVERY 2 HOURS AS NEEDED FOR MIGRAINE OR HEADACHE. 9 tablet 5  . tiZANidine (ZANAFLEX) 4 MG capsule Take 4 mg by mouth 3 (three) times daily as needed.     . topiramate (TOPAMAX) 100 MG tablet Take 1 tablet (100 mg total) by mouth 2 (two) times daily. 60 tablet 1  . topiramate (TOPAMAX) 25 MG capsule Take 1 capsule (25 mg total) by mouth 2 (two) times daily. 30 capsule 0  . topiramate (TOPAMAX) 50 MG tablet Take 1 tablet (50 mg total) by mouth 2 (two) times daily. 30 tablet 0  . TURMERIC CURCUMIN PO Take 1,050 mg by mouth daily.    . valACYclovir (VALTREX) 500 MG tablet Take 500 mg by mouth daily.    . vitamin C (ASCORBIC ACID) 500 MG tablet Take 500 mg by mouth daily.     No current facility-administered medications on file prior to visit.     BP 126/80 (BP Location: Left Arm, Patient Position: Sitting, Cuff Size: Normal)   Pulse 71   Temp 98 F (36.7 C) (Oral)   Ht 5\' 9"  (1.753 m)   Wt 201 lb 8 oz (91.4 kg)   SpO2 98%   BMI 29.76 kg/m     Review of Systems  Constitutional: Positive for activity change, appetite change and unexpected weight change.  Psychiatric/Behavioral: Positive for agitation, dysphoric mood, self-injury and sleep disturbance. The patient is nervous/anxious.        Objective:   Physical Exam  Constitutional: He appears well-developed and well-nourished.   Anxious At times tearful Blood pressure 120/76  Psychiatric:  Anxious mood with depressed affect          Assessment & Plan:   Migraine headaches.  Patient has significant shoulder and cervical degenerative disc disease, and he feels these may be triggers.  Will work with his insurance plan to obtain coverage for Imitrex and hopefully a much larger quantity.  He was also given a prescription for generic Maxalt.  Topamax discontinued.  Neurology referral Low back pain with radiculopathy.  Patient scheduled for epidural next week.  May require surgery  CPX as scheduled  Nyoka Cowden

## 2016-09-29 NOTE — Progress Notes (Signed)
Pre visit review using our clinic review tool, if applicable. No additional management support is needed unless otherwise documented below in the visit note. 

## 2016-10-02 ENCOUNTER — Telehealth: Payer: Self-pay

## 2016-10-02 NOTE — Telephone Encounter (Signed)
Received [place] denial on Imitrex appeal. In the letter, it does mention that the generic, sumatriptan, is covered by the plan. Along with naratriptan, rizatriptan. The letter the insurance company received didn't say the drugs on [employer]'s drug list: won't or haven't worked, or that they would cause a bad drug reaction or incorrect usage.

## 2016-10-05 ENCOUNTER — Ambulatory Visit
Admission: RE | Admit: 2016-10-05 | Discharge: 2016-10-05 | Disposition: A | Discharge status: Home / Self Care | Payer: Commercial Managed Care - HMO | Source: Ambulatory Visit | Attending: Internal Medicine | Admitting: Internal Medicine

## 2016-10-05 ENCOUNTER — Inpatient Hospital Stay
Admission: RE | Admit: 2016-10-05 | Discharge: 2016-10-05 | Disposition: A | Discharge status: Home / Self Care | Payer: Commercial Managed Care - HMO | Source: Ambulatory Visit | Attending: Internal Medicine | Admitting: Internal Medicine

## 2016-10-05 DIAGNOSIS — M48061 Spinal stenosis, lumbar region without neurogenic claudication: Secondary | ICD-10-CM | POA: Diagnosis not present

## 2016-10-05 DIAGNOSIS — G8929 Other chronic pain: Secondary | ICD-10-CM

## 2016-10-05 DIAGNOSIS — M545 Low back pain: Principal | ICD-10-CM

## 2016-10-05 MED ORDER — METHYLPREDNISOLONE ACETATE 40 MG/ML INJ SUSP (RADIOLOG
120.0000 mg | Freq: Once | INTRAMUSCULAR | Status: AC
  Administered 2016-10-05: 120 mg via EPIDURAL

## 2016-10-05 MED ORDER — IOPAMIDOL (ISOVUE-M 200) INJECTION 41%
1.0000 mL | Freq: Once | INTRAMUSCULAR | Status: AC
  Administered 2016-10-05: 1 mL via EPIDURAL

## 2016-10-05 NOTE — Discharge Instructions (Signed)

## 2016-10-06 ENCOUNTER — Ambulatory Visit (INDEPENDENT_AMBULATORY_CARE_PROVIDER_SITE_OTHER): Payer: Medicare HMO | Admitting: Neurology

## 2016-10-06 VITALS — BP 131/70 | HR 104 | Ht 69.0 in | Wt 209.0 lb

## 2016-10-06 DIAGNOSIS — R519 Headache, unspecified: Secondary | ICD-10-CM

## 2016-10-06 DIAGNOSIS — G43711 Chronic migraine without aura, intractable, with status migrainosus: Secondary | ICD-10-CM | POA: Diagnosis not present

## 2016-10-06 DIAGNOSIS — R0683 Snoring: Secondary | ICD-10-CM

## 2016-10-06 DIAGNOSIS — R51 Headache: Secondary | ICD-10-CM

## 2016-10-06 MED ORDER — DULOXETINE HCL 60 MG PO CPEP: 60.0000 mg | ORAL_CAPSULE | Freq: Every day | ORAL | 12 refills | Status: DC

## 2016-10-06 MED ORDER — SUMATRIPTAN SUCCINATE 100 MG PO TABS: 100.0000 mg | ORAL_TABLET | Freq: Once | ORAL | 12 refills | Status: DC | PRN

## 2016-10-06 NOTE — Patient Instructions (Addendum)
Remember to drink plenty of fluid, eat healthy meals and do not skip any meals. Try to eat protein with a every meal and eat a healthy snack such as fruit or nuts in between meals. Try to keep a regular sleep-wake schedule and try to exercise daily, particularly in the form of walking, 20-30 minutes a day, if you can.   As far as your medications are concerned, I would like to suggest: Cymbalta  As far as diagnostic testing: Sleep evaluation, Cymbalta, MRI brain  I would like to see you back in 3 months, sooner if we need to. Please call us with any interim questions, concerns, problems, updates or refill requests.   Our phone number is [phone]. We also have an after hours call service for urgent matters and there is a physician on-call for urgent questions. For any emergencies you know to call 911 or go to the nearest emergency room  Duloxetine delayed-release capsules What is this medicine? DULOXETINE (doo LOX e teen) is used to treat depression, anxiety, and different types of chronic pain. This medicine may be used for other purposes; ask your health care provider or pharmacist if you have questions. COMMON BRAND NAME(S): Cymbalta, Irenka What should I tell my health care provider before I take this medicine? They need to know if you have any of these conditions: -bipolar disorder or a family history of bipolar disorder -glaucoma -kidney disease -liver disease -suicidal thoughts or a previous suicide attempt -taken medicines called MAOIs like Carbex, Eldepryl, Marplan, Nardil, and Parnate within 14 days -an unusual reaction to duloxetine, other medicines, foods, dyes, or preservatives -pregnant or trying to get pregnant -breast-feeding How should I use this medicine? Take this medicine by mouth with a glass of water. Follow the directions on the prescription label. Do not cut, crush or chew this medicine. You can take this medicine with or without food. Take your medicine at  regular intervals. Do not take your medicine more often than directed. Do not stop taking this medicine suddenly except upon the advice of your doctor. Stopping this medicine too quickly may cause serious side effects or your condition may worsen. A special MedGuide will be given to you by the pharmacist with each prescription and refill. Be sure to read this information carefully each time. Talk to your pediatrician regarding the use of this medicine in children. While this drug may be prescribed for children as young as 79 years of age for selected conditions, precautions do apply. Overdosage: If you think you have taken too much of this medicine contact a poison control center or emergency room at once. NOTE: This medicine is only for you. Do not share this medicine with others. What if I miss a dose? If you miss a dose, take it as soon as you can. If it is almost time for your next dose, take only that dose. Do not take double or extra doses. What may interact with this medicine? Do not take this medicine with any of the following medications: -desvenlafaxine -levomilnacipran -linezolid -MAOIs like Carbex, Eldepryl, Marplan, Nardil, and Parnate -methylene blue (injected into a vein) -milnacipran -thioridazine -venlafaxine This medicine may also interact with the following medications: -alcohol -amphetamines -aspirin and aspirin-like medicines -certain antibiotics like ciprofloxacin and enoxacin -certain medicines for blood pressure, heart disease, irregular heart beat -certain medicines for depression, anxiety, or psychotic disturbances -certain medicines for migraine headache like almotriptan, eletriptan, frovatriptan, naratriptan, rizatriptan, sumatriptan, zolmitriptan -certain medicines that treat or prevent blood clots like warfarin,  enoxaparin, and dalteparin -cimetidine -fentanyl -lithium -NSAIDS, medicines for pain and inflammation, like ibuprofen or  naproxen -phentermine -procarbazine -rasagiline -sibutramine -St. John's wort -theophylline -tramadol -tryptophan This list may not describe all possible interactions. Give your health care provider a list of all the medicines, herbs, non-prescription drugs, or dietary supplements you use. Also tell them if you smoke, drink alcohol, or use illegal drugs. Some items may interact with your medicine. What should I watch for while using this medicine? Tell your doctor if your symptoms do not get better or if they get worse. Visit your doctor or health care professional for regular checks on your progress. Because it may take several weeks to see the full effects of this medicine, it is important to continue your treatment as prescribed by your doctor. Patients and their families should watch out for new or worsening thoughts of suicide or depression. Also watch out for sudden changes in feelings such as feeling anxious, agitated, panicky, irritable, hostile, aggressive, impulsive, severely restless, overly excited and hyperactive, or not being able to sleep. If this happens, especially at the beginning of treatment or after a change in dose, call your health care professional. Dennis Bast may get drowsy or dizzy. Do not drive, use machinery, or do anything that needs mental alertness until you know how this medicine affects you. Do not stand or sit up quickly, especially if you are an older patient. This reduces the risk of dizzy or fainting spells. Alcohol may interfere with the effect of this medicine. Avoid alcoholic drinks. This medicine can cause an increase in blood pressure. This medicine can also cause a sudden drop in your blood pressure, which may make you feel faint and increase the chance of a fall. These effects are most common when you first start the medicine or when the dose is increased, or during use of other medicines that can cause a sudden drop in blood pressure. Check with your doctor for  instructions on monitoring your blood pressure while taking this medicine. Your mouth may get dry. Chewing sugarless gum or sucking hard candy, and drinking plenty of water may help. Contact your doctor if the problem does not go away or is severe. What side effects may I notice from receiving this medicine? Side effects that you should report to your doctor or health care professional as soon as possible: -allergic reactions like skin rash, itching or hives, swelling of the face, lips, or tongue -anxious -breathing problems -confusion -changes in vision -chest pain -confusion -elevated mood, decreased need for sleep, racing thoughts, impulsive behavior -eye pain -fast, irregular heartbeat -feeling faint or lightheaded, falls -feeling agitated, angry, or irritable -hallucination, loss of contact with reality -high blood pressure -loss of balance or coordination -palpitations -redness, blistering, peeling or loosening of the skin, including inside the mouth -restlessness, pacing, inability to keep still -seizures -stiff muscles -suicidal thoughts or other mood changes -trouble passing urine or change in the amount of urine -trouble sleeping -unusual bleeding or bruising -unusually weak or tired -vomiting -yellowing of the eyes or skin Side effects that usually do not require medical attention (report to your doctor or health care professional if they continue or are bothersome): -change in sex drive or performance -change in appetite or weight -constipation -dizziness -dry mouth -headache -increased sweating -nausea -tired This list may not describe all possible side effects. Call your doctor for medical advice about side effects. You may report side effects to FDA at 1-800-FDA-1088. Where should I keep my medicine? Keep  out of the reach of children. Store at room temperature between 20 and 25 degrees C (68 to 77 degrees F). Throw away any unused medicine after the  expiration date. NOTE: This sheet is a summary. It may not cover all possible information. If you have questions about this medicine, talk to your doctor, pharmacist, or health care provider.  2017 [employer] (2016-02-10 18:16:03)

## 2016-10-06 NOTE — Progress Notes (Signed)
[facility]    Provider:  Dr [name] [name] Referring Provider: Marletta Lor, MD Primary Care Physician:  [name] Cowden, MD  CC:  Morning headaches  HPI:  [name] [name] is a 66 y.o. male here as a referral from Dr. Burnice [name] for migraines. PMHx migraines. He has had migraines/headaches for 10 years. He travels a lot and has significant osteoarthritis. He has had shoulder replacements and back fusion of l4-l5. His skeletal issues are a migraine trigger. All his migraines are in the middle of the night, he wakes up with headaches 95% of the time. He has seen a lot of doctors. Sumatriptan helps with the migraines.  He has been to many doctors. He saw Dr. Melton [name] for headaches. He has daily headaches. He was taking Sumatriptan daily but now only 9 times a month and no medication overuse. Headaches start in the neck, feels like he is going to explode, all over the head, so severe he can't even look at things, painful pressure all over the head, in his eyes behind his eyes, stiffness and tense, his head is pounding, nausea, extreme light and sound sensitivity. He snores, he doesn't wake up from snoring. No aura. He will also get migraines during the day if he is not conscious of his posture. Migraines last 8 hours. He is terrified to go to bed at night because he wakes up with headaches. No vision changes, speech changes, new weakness or sensory changes. No other focal neurologic deficits, associated symptoms, inciting events or modifiable factors.  Tried: Topiramate, Elavil, acetaminophen, imitrex   Reviewed notes, labs and imaging from outside physicians, which showed:  Reviewed primary care notes and notes from [facility] spine center. Patient has a history of migraines. Migraines have been refractory. These responded nicely to sumatriptan but his insurance company has switched to an alternate triptan and he is quite limited in his monthly supply. He is under  considerable stress recently. He tried Topamax which she did not tolerate well and recently discontinued. He is previous s/p posterior lumbar interbody fusion at L4-L5 performed on 10-07-15 with noted degenerative changes at L3-L4 with collapse to the right and noted degenerative scoliosis. He has right low back pain and right hip pain that is constant and it hurts in any position. He denies any radicular pain or numbness or weakness. He has a progressive collapse of the disc space at L3-4 that is eccentric and now accentuating the lateral listhesis at L2-3. The MR shows obvious endplate inflammation as well so they will start with ESI efforts and see if this helps.If not, he will need an extension of his fusion with correciton of the deformity up to L2.Marland Kitchen  MRI LUMBAR SPINE WITHOUT CONTRAST, 08/16/2016 10:51 AM   INDICATION: LOW BACK PAIN, >6 WKS / RED FLAG(S) / RADICULOPATHY \ \ Z98.1 S/P lumbar fusion \ M54.5 Spine pain, lumbar   COMPARISON: Lumbar spine radiograph 07/18/2016 and CT myelogram 07/12/2015   TECHNIQUE: Multiplanar, multi-sequence surface-coil MR imaging of the lumbar spine was performed without contrast.   LEVELS IMAGED: Lower thoracic to upper sacral region.   FINDINGS:  Alignment: Similar grade 1 anterolisthesis of L4 and L5. Trace retrolisthesis of L2 on L3, similar to prior.Similar levoscoliotic curvature of the spine. Vertebrae: There are fatty endplate degenerative changes at L4-L5. Increased T2/STIR signal within the endplates at [id number] and the inferior endplate of L2, compatible with edema/inflammation, likely degenerative in etiology. Redemonstrated postsurgical changes of posterior lumbar interbody fusion at L4-L5 with pedicle screw  and rod internal fixation. Paraspinal soft tissues: Increased T2/STIR signal within the paraspinal musculature adjacent to the operative site, which is favored to reflect postsurgical changes. Conus: Normal in signal and position, terminating  at the superior aspect of L2   T12-L1: No significant focal abnormality. L1-L2: No significant focal abnormality. L2-L3: Similar trace retrolisthesis of L2 on L3 with slight uncovering of the disc. There is a left eccentric disc bulge with disc desiccation and height loss. Facet hypertrophy with ligamentum flavum thickening and facet effusions bilaterally. There is mild bilateral foraminal narrowing L3-L4: There is marked narrowing of the right aspect of the disc space at L3-4 with surrounding high STIR, low T1 signal compatible with edema, likely related to progressive degenerative changes at this level. Broad-based disc bulge with facet hypertrophy and ligamentum flavum thickening. There is moderate right lateral recess narrowing. There is moderate right foraminal narrowing and mild left foraminal narrowing. L4-L5: Grade one anterolisthesis of L4 and L5, with slight uncovering of the disc. There is facet hypertrophy and ligamentum flavum thickening without canal narrowing. There is soft tissue extending into the foramen on the right at L4-L5.  L5-S1: Left eccentric disc bulge. There is moderate left foraminal narrowing. No significant canal narrowing.    Review of Systems: Patient complains of symptoms per HPI as well as the following symptoms:No CP, no SOB. Pertinent negatives per HPI. All others negative.   Social History   Social History  . Marital status: Married    Spouse name: N/A  . Number of children: N/A  . Years of education: N/A   Occupational History  . Not on file.   Social History Main Topics  . Smoking status: Never Smoker  . Smokeless tobacco: Never Used  . Alcohol use No  . Drug use: No  . Sexual activity: Yes    Partners: Female   Other Topics Concern  . Not on file   Social History Narrative  . No narrative on file    Family History  Problem Relation Age of Onset  . COPD Father     family hx  . Aneurysm Mother   . Cancer Maternal Grandfather       lung    Past Medical History:  Diagnosis Date  . ALLERGIC RHINITIS 04/22/2007  . Cancer (HCC)    HX SKIN CANCER  . Complication of anesthesia    more concerned about positioning of head & neck because of TMJ  . DDD (degenerative disc disease), cervical   . GERD (gastroesophageal reflux disease)    infrequently  . Headache(784.0)    migraines  . History of kidney stones   . History of skin cancer   . History of TMJ disorder   . HYPERCHOLESTEROLEMIA 04/22/2007  . HYPERLIPIDEMIA 07/29/2007  . Hyperplastic colon polyp   . NEPHROLITHIASIS, HX OF 07/29/2007  . Osteoarth NOS-Unspec 04/22/2007  . Shoulder pain, acute    bilateral    Past Surgical History:  Procedure Laterality Date  . APPENDECTOMY    . JOINT REPLACEMENT    . KNEE ARTHROSCOPY     right x2  . SHOULDER SURGERY     right  . TOTAL KNEE ARTHROPLASTY Right 08/25/2013   Procedure: RIGHT TOTAL KNEE ARTHROPLASTY;  Surgeon: Gearlean Alf, MD;  Location: [facility];  Service: Orthopedics;  Laterality: Right;  . TOTAL SHOULDER ARTHROPLASTY Left 12/25/2013   DR SUPPLE   . TOTAL SHOULDER ARTHROPLASTY Left 12/25/2013   Procedure: LEFT TOTAL SHOULDER ARTHROPLASTY;  Surgeon: Marin Shutter,  MD;  Location: [facility];  Service: Orthopedics;  Laterality: Left;  . TOTAL SHOULDER ARTHROPLASTY Right 06/18/2014   DR SUPPLE  . TOTAL SHOULDER ARTHROPLASTY Right 06/18/2014   Procedure: RIGHT TOTAL SHOULDER ARTHROPLASTY;  Surgeon: Marin Shutter, MD;  Location: [facility];  Service: Orthopedics;  Laterality: Right;    Current Outpatient Prescriptions  Medication Sig Dispense Refill  . allopurinol (ZYLOPRIM) 100 MG tablet Take 1 tablet (100 mg total) by mouth daily. 90 tablet 3  . amitriptyline (ELAVIL) 25 MG tablet TAKE 1 TABLET THREE TIMES DAILY 270 tablet 3  . aspirin 81 MG tablet Take 81 mg by mouth daily.    . Cholecalciferol (VITAMIN D PO) Take 1 tablet by mouth daily.    . Multiple Vitamin (MULTIVITAMIN) tablet Take 1 tablet by mouth daily.     . Omega-3 Fatty Acids (FISH OIL) 1000 MG CAPS Take 1 capsule by mouth daily.    . pravastatin (PRAVACHOL) 40 MG tablet Take 1 tablet (40 mg total) by mouth daily. 90 tablet 3  . Psyllium (METAMUCIL PO) Take 2 tablets by mouth 2 (two) times daily.    . Red Yeast Rice Extract (RED YEAST RICE PO) Take 1 tablet by mouth daily.    . rizatriptan (MAXALT) 10 MG tablet Take 1 tablet (10 mg total) by mouth as needed for migraine. May repeat in 2 hours if needed 30 tablet 4  . tiZANidine (ZANAFLEX) 4 MG capsule Take 4 mg by mouth 3 (three) times daily as needed.     . topiramate (TOPAMAX) 100 MG tablet Take 1 tablet (100 mg total) by mouth 2 (two) times daily. 60 tablet 1  . topiramate (TOPAMAX) 25 MG capsule Take 1 capsule (25 mg total) by mouth 2 (two) times daily. 30 capsule 0  . topiramate (TOPAMAX) 50 MG tablet Take 1 tablet (50 mg total) by mouth 2 (two) times daily. 30 tablet 0  . TURMERIC CURCUMIN PO Take 1,050 mg by mouth daily.    . valACYclovir (VALTREX) 500 MG tablet Take 500 mg by mouth daily.    . vitamin C (ASCORBIC ACID) 500 MG tablet Take 500 mg by mouth daily.    Marland Kitchen zolpidem (AMBIEN) 10 MG tablet Take 1 tablet (10 mg total) by mouth at bedtime as needed for sleep. 30 tablet 2  . DULoxetine (CYMBALTA) 60 MG capsule Take 1 capsule (60 mg total) by mouth daily. 30 capsule 12  . SUMAtriptan (IMITREX) 100 MG tablet Take 1 tablet (100 mg total) by mouth once as needed. May repeat in 2 hours if headache persists or recurs. 10 tablet 12   No current facility-administered medications for this visit.     Allergies as of 10/06/2016 - Review Complete 10/06/2016  Allergen Reaction Noted  . Shrimp [shellfish allergy] Anaphylaxis and Rash 05/22/2012  . Codeine Rash and Other (See Comments)   . Contrast media [iodinated diagnostic agents] Rash 11/11/2015  . Diazepam Rash 06/26/2014  . Amoxicillin Other (See Comments)   . Erythromycin Other (See Comments)   . Betadine [povidone iodine] Rash  05/22/2012  . Celebrex [celecoxib] Rash 05/22/2012  . Meperidine and related Rash 05/22/2012  . Methocarbamol Rash 05/22/2012  . Oxycodone Rash 10/14/2015  . Penicillins Rash   . Plavix [clopidogrel] Rash 12/11/2013  . Rivaroxaban Rash 10/20/2013  . Vioxx [rofecoxib] Rash 05/22/2012    Vitals: BP 131/70 (BP Location: Left Arm, Patient Position: Sitting, Cuff Size: Normal)   Pulse (!) 104   Ht 5\' 9"  (1.753 m)  Wt 209 lb (94.8 kg)   BMI 30.86 kg/m  Last Weight:  Wt Readings from Last 1 Encounters:  10/06/16 209 lb (94.8 kg)   Last Height:   Ht Readings from Last 1 Encounters:  10/06/16 5\' 9"  (1.753 m)   Physical exam: Exam: Gen: NAD, conversant, well nourised, well groomed                     CV: RRR, no MRG. No Carotid Bruits. No peripheral edema, warm, nontender Eyes: Conjunctivae clear without exudates or hemorrhage  Neuro: Detailed Neurologic Exam  Speech:    Speech is normal; fluent and spontaneous with normal comprehension.  Cognition:    The patient is oriented to person, place, and time;     recent and remote memory intact;     language fluent;     normal attention, concentration,     fund of knowledge Cranial Nerves:    The pupils are equal, round, and reactive to light. The fundi are normal and spontaneous venous pulsations are present. Visual fields are full to finger confrontation. Extraocular movements are intact. Trigeminal sensation is intact and the muscles of mastication are normal. The face is symmetric. The palate elevates in the midline. Hearing intact. Voice is normal. Shoulder shrug is normal. The tongue has normal motion without fasciculations.   Coordination:    Normal finger to nose and heel to shin. Normal rapid alternating movements.   Gait:    Heel-toe and tandem gait are normal.   Motor Observation:    No asymmetry, no atrophy, and no involuntary movements noted. Tone:    Normal muscle tone.    Posture:    Posture is normal. normal  erect    Strength:    Strength is V/V in the upper and lower limbs.      Sensation: intact to LT     Reflex Exam:  DTR's:    Deep tendon reflexes in the upper and lower extremities are normal bilaterally.   Toes:    The toes are downgoing bilaterally.   Clonus:    Clonus is absent.       Assessment/Plan:  66 year old with severe morning headaches  Morning headaches daily: Sleep study  MRI brain wo contrast (he declines contrast, says he is allergic to many things)  Will start cymbalta, discussed side effects  Discussed: To prevent or relieve headaches, try the following: Cool Compress. Lie down and place a cool compress on your head.  Avoid headache triggers. If certain foods or odors seem to have triggered your migraines in the past, avoid them. A headache diary might help you identify triggers.  Include physical activity in your daily routine. Try a daily walk or other moderate aerobic exercise.  Manage stress. Find healthy ways to cope with the stressors, such as delegating tasks on your to-do list.  Practice relaxation techniques. Try deep breathing, yoga, massage and visualization.  Eat regularly. Eating regularly scheduled meals and maintaining a healthy diet might help prevent headaches. Also, drink plenty of fluids.  Follow a regular sleep schedule. Sleep deprivation might contribute to headaches Consider biofeedback. With this mind-body technique, you learn to control certain bodily functions - such as muscle tension, heart rate and blood pressure - to prevent headaches or reduce headache pain.    Proceed to emergency room if you experience new or worsening symptoms or symptoms do not resolve, if you have new neurologic symptoms or if headache is severe, or for any  concerning symptom.     Sarina Ill, MD  [facility] [address] [place], [place] [address]  Phone [phone] Fax [phone]

## 2016-10-07 ENCOUNTER — Encounter: Payer: Self-pay | Admitting: Neurology

## 2016-10-09 ENCOUNTER — Telehealth: Payer: Self-pay | Admitting: *Deleted

## 2016-10-09 NOTE — Telephone Encounter (Signed)
Pt called RN back. Msg relayed. Patient said that was "great, I have a Dr and RN that care about me and get things done quickly and the staff also".  Pt said he mentioned using rizatriptan and sumatriptan to Dr Jaynee Eagles at his OV. He said Dr Jaynee Eagles advised not to use the meds together. He has #12 rizatriptan left,can he use them in a pinch or should he discard. (Which means if he runs out of sumatriptan for the month can he take rizatriptan.

## 2016-10-09 NOTE — Telephone Encounter (Signed)
Called and LVM for pt to call office back. Please relay the following message if patient calls:  I spoke to his insurance company last Friday and they advised that generic sumatriptan is covered by his insurance (It is on formularly).  I called and spoke to his pharmacy today and spoke with Maudie Mercury. She advised rx sumatriptan went through okay and cost is $5.00. Ready for him to pick up.

## 2016-10-09 NOTE — Telephone Encounter (Signed)
Called patient back. Advised he should not be taking both the rizatriptan and sumatriptan together. He should not be taking more than 10 tablets per month. I educated patient that this could cause rebound headaches. He understands but stated this is the way he has always taken the medication. I advised several times that he should only be taking 10 tabs per month. If he is needing more than that, he should come back for a f/u to discuss with Dr Jaynee Eagles. He stated "We just did that at the office visit and that is why we are getting the brain scans". Advised that from here on out, he should limit himself to 10 per month. I will call with results once they come back. He verbalized understanding.

## 2016-10-10 ENCOUNTER — Other Ambulatory Visit: Payer: Self-pay | Admitting: Internal Medicine

## 2016-10-17 NOTE — Telephone Encounter (Signed)
Called and spoke to tech at pt pharmacy. Advised we received fax from [facility] that stated pt has a quantity limit on sumatriptan. Advised I called on 10/09/16 and they informed me rx went through ok and cost him $5.00.   She advised that since pt filled rx rizatriptan on 09/29/16 for qty 12, he cannot fill sumatriptan until 10/30/16 since it is in the same class of medication.

## 2016-10-19 ENCOUNTER — Ambulatory Visit (INDEPENDENT_AMBULATORY_CARE_PROVIDER_SITE_OTHER): Payer: Self-pay

## 2016-10-19 DIAGNOSIS — R51 Headache: Secondary | ICD-10-CM | POA: Diagnosis not present

## 2016-10-19 DIAGNOSIS — R519 Headache, unspecified: Secondary | ICD-10-CM

## 2016-10-19 DIAGNOSIS — Z0289 Encounter for other administrative examinations: Secondary | ICD-10-CM

## 2016-10-23 ENCOUNTER — Telehealth: Payer: Self-pay | Admitting: *Deleted

## 2016-10-23 ENCOUNTER — Encounter: Payer: Self-pay | Admitting: Neurology

## 2016-10-23 NOTE — Telephone Encounter (Signed)
Called and LVM for pt about normal MRI brain results. Gave [facility] phone number if he has further questions or concerns.

## 2016-10-23 NOTE — Telephone Encounter (Signed)
-----   Message from Melvenia Beam, MD sent at 10/22/2016  2:31 PM EST ----- MRI brain is normal thanks

## 2016-10-23 NOTE — Telephone Encounter (Signed)
Called pt per AA,MD request. Pt stated he is feeling better. Migraine went away. He took a sumatriptan at 930am and felt better around 12pm.  Advised again he should not take with rizatriptan, and he should not take more than 2 tabs in 24 hr or 2-3 doses in a week. He verbalized understanding.   He will call back if symptoms worsen or he has any new symptoms. He stated he has sleep consult with Dr Rexene Alberts on 10/25/16.

## 2016-10-25 ENCOUNTER — Telehealth: Payer: Self-pay | Admitting: Neurology

## 2016-10-25 ENCOUNTER — Ambulatory Visit (INDEPENDENT_AMBULATORY_CARE_PROVIDER_SITE_OTHER): Payer: Medicare HMO | Admitting: Neurology

## 2016-10-25 ENCOUNTER — Encounter: Payer: Self-pay | Admitting: Neurology

## 2016-10-25 VITALS — BP 128/76 | HR 80 | Resp 16 | Ht 69.0 in | Wt 209.0 lb

## 2016-10-25 DIAGNOSIS — G478 Other sleep disorders: Secondary | ICD-10-CM | POA: Diagnosis not present

## 2016-10-25 DIAGNOSIS — R519 Headache, unspecified: Secondary | ICD-10-CM

## 2016-10-25 DIAGNOSIS — R51 Headache: Secondary | ICD-10-CM | POA: Diagnosis not present

## 2016-10-25 DIAGNOSIS — G43019 Migraine without aura, intractable, without status migrainosus: Secondary | ICD-10-CM | POA: Diagnosis not present

## 2016-10-25 DIAGNOSIS — R0683 Snoring: Secondary | ICD-10-CM

## 2016-10-25 DIAGNOSIS — E663 Overweight: Secondary | ICD-10-CM | POA: Diagnosis not present

## 2016-10-25 NOTE — Patient Instructions (Signed)

## 2016-10-25 NOTE — Progress Notes (Signed)
Subjective:    Patient ID: [name] [name] is a 66 y.o. male.  HPI     [name], MD, PhD [facility] [address] [place], [place] [address]  Dear [name] [name],   I saw your patient,  [name], upon your kind request in my clinic today for initial consultation of his sleep disorder, in particular, concern for underlying obstructive sleep apnea. The patient is unaccompanied today. As you know, [name] [name] is a 66 year old right-handed gentleman with an underlying medical history of allergic rhinitis, degenerative disc disease, reflux disease, chronic migraines, history of kidney stone, history of skin cancer, TMJ disorder, hyperlipidemia, status post multiple surgeries including right total knee replacement surgery, right-sided shoulder surgery, left shoulder replacement surgery, right shoulder replacement surgery, lower back surgery, and borderline obesity, who reports snoring and excessive daytime somnolence as well as morning headaches and waking up not refreshed. I reviewed your office note from 10/06/2016. His Epworth sleepiness score is 4 out of 24 today, his fatigue score is 12 out of 63. He had a brain MRI wo on 10/19/16, which I reviewed: results were normal for age.  Tried amitriptyline, which did not help, and topamax caused SEs, currently on Cymbalta, no SEs thus far.  Never tried the Ambien for fear of SEs.  Denies RLS symptoms, but has pain in various joint areas, cannot sleep on his back.  Was overusing triptan, nearly daily intake and was counseled by you to Not use it on a daily basis, he stopped using Imitrex for about 10 days and started having some nervousness, night sweats and possible signs of withdrawal. He also had a flareup of his migraine headaches, lasting for over 12 hours at a time. He has undergone lumbar spine surgery under Dr. Harl [name] in [place] in January 2017. He has inability to sleep on his back, tosses and  turns a lot because of ongoing issues with joint pain and not being able to sleep in one position for any length of time. He takes 2 Tylenol PM at night, but time is usually after 11 PM, but typically before midnight, wakeup time without alarm around 6:30. He has nocturia about once or twice per average night, has nearly daily morning headaches. He lives with his wife, he has a 23 year old daughter who lives in [place] and expecting her first child any day. He is a nonsmoker, does not drink alcohol, drinks caffeine in the form of coffee, 2-3 cups per day. He is retired from [employer], had to take early retirement due to his arthritis issues, particularly need for surgery of his right knee.   His Past Medical History Is Significant For: Past Medical History:  Diagnosis Date  . ALLERGIC RHINITIS 04/22/2007  . Cancer (HCC)    HX SKIN CANCER  . Complication of anesthesia    more concerned about positioning of head & neck because of TMJ  . DDD (degenerative disc disease), cervical   . GERD (gastroesophageal reflux disease)    infrequently  . Headache(784.0)    migraines  . History of kidney stones   . History of skin cancer   . History of TMJ disorder   . HYPERCHOLESTEROLEMIA 04/22/2007  . HYPERLIPIDEMIA 07/29/2007  . Hyperplastic colon polyp   . NEPHROLITHIASIS, HX OF 07/29/2007  . Osteoarth NOS-Unspec 04/22/2007  . Shoulder pain, acute    bilateral    His Past Surgical History Is Significant For: Past Surgical History:  Procedure Laterality Date  . APPENDECTOMY    .  JOINT REPLACEMENT    . KNEE ARTHROSCOPY     right x2  . SHOULDER SURGERY     right  . TOTAL KNEE ARTHROPLASTY Right 08/25/2013   Procedure: RIGHT TOTAL KNEE ARTHROPLASTY;  Surgeon: Gearlean Alf, MD;  Location: [facility];  Service: Orthopedics;  Laterality: Right;  . TOTAL SHOULDER ARTHROPLASTY Left 12/25/2013   DR SUPPLE   . TOTAL SHOULDER ARTHROPLASTY Left 12/25/2013   Procedure: LEFT TOTAL SHOULDER ARTHROPLASTY;   Surgeon: [name] Shutter, MD;  Location: [facility];  Service: Orthopedics;  Laterality: Left;  . TOTAL SHOULDER ARTHROPLASTY Right 06/18/2014   DR SUPPLE  . TOTAL SHOULDER ARTHROPLASTY Right 06/18/2014   Procedure: RIGHT TOTAL SHOULDER ARTHROPLASTY;  Surgeon: [name] Shutter, MD;  Location: [facility];  Service: Orthopedics;  Laterality: Right;    His Family History Is Significant For: Family History  Problem Relation Age of Onset  . COPD Father     family hx  . Aneurysm Mother   . Cancer Maternal Grandfather     lung    His Social History Is Significant For: Social History   Social History  . Marital status: Married    Spouse name: N/A  . Number of children: N/A  . Years of education: N/A   Social History Main Topics  . Smoking status: Never Smoker  . Smokeless tobacco: Never Used  . Alcohol use No  . Drug use: No  . Sexual activity: Yes    Partners: Female   Other Topics Concern  . None   Social History Narrative  . None    His Allergies Are:  Allergies  Allergen Reactions  . Shrimp [Shellfish Allergy] Anaphylaxis and Rash  . Codeine Rash and Other (See Comments)    Blacked out  . Contrast Media [Iodinated Diagnostic Agents] Rash  . Diazepam Rash    Rash over whole body from neck down, and he lost his voice (airway compromise?)  . Amoxicillin Other (See Comments)    Unknown   . Erythromycin Other (See Comments)    unknown  . Betadine [Povidone Iodine] Rash  . Celebrex [Celecoxib] Rash  . Meperidine And Related Rash  . Methocarbamol Rash  . Oxycodone Rash  . Penicillins Rash  . Plavix [Clopidogrel] Rash  . Rivaroxaban Rash  . Vioxx [Rofecoxib] Rash  :   Her Current Medications Are:  Outpatient Encounter Prescriptions as of 10/25/2016  Medication Sig  . allopurinol (ZYLOPRIM) 100 MG tablet TAKE 1 TABLET (100 MG TOTAL) BY MOUTH DAILY.  Marland Kitchen amitriptyline (ELAVIL) 25 MG tablet TAKE 1 TABLET THREE TIMES DAILY  . aspirin 81 MG tablet Take 81 mg by mouth daily.  .  Cholecalciferol (VITAMIN D PO) Take 1 tablet by mouth daily.  . DULoxetine (CYMBALTA) 60 MG capsule Take 1 capsule (60 mg total) by mouth daily.  . Multiple Vitamin (MULTIVITAMIN) tablet Take 1 tablet by mouth daily.  . Omega-3 Fatty Acids (FISH OIL) 1000 MG CAPS Take 1 capsule by mouth daily.  . pravastatin (PRAVACHOL) 40 MG tablet TAKE 1 TABLET EVERY DAY  . Psyllium (METAMUCIL PO) Take 2 tablets by mouth 2 (two) times daily.  . Red Yeast Rice Extract (RED YEAST RICE PO) Take 1 tablet by mouth daily.  . rizatriptan (MAXALT) 10 MG tablet Take 1 tablet (10 mg total) by mouth as needed for migraine. May repeat in 2 hours if needed  . SUMAtriptan (IMITREX) 100 MG tablet Take 1 tablet (100 mg total) by mouth once as needed. May repeat  in 2 hours if headache persists or recurs.  Marland Kitchen tiZANidine (ZANAFLEX) 4 MG capsule Take 4 mg by mouth 3 (three) times daily as needed.   . topiramate (TOPAMAX) 100 MG tablet Take 1 tablet (100 mg total) by mouth 2 (two) times daily.  Marland Kitchen topiramate (TOPAMAX) 25 MG capsule Take 1 capsule (25 mg total) by mouth 2 (two) times daily.  Marland Kitchen topiramate (TOPAMAX) 50 MG tablet Take 1 tablet (50 mg total) by mouth 2 (two) times daily.  . TURMERIC CURCUMIN PO Take 1,050 mg by mouth daily.  . valACYclovir (VALTREX) 500 MG tablet Take 500 mg by mouth daily.  . vitamin C (ASCORBIC ACID) 500 MG tablet Take 500 mg by mouth daily.  Marland Kitchen zolpidem (AMBIEN) 10 MG tablet Take 1 tablet (10 mg total) by mouth at bedtime as needed for sleep.   No facility-administered encounter medications on file as of 10/25/2016.   :  Review of Systems:  Out of a complete 14 point review of systems, all are reviewed and negative with the exception of these symptoms as listed below: Review of Systems  Neurological:       Patient has trouble staying asleep, no trouble falling asleep, snores, wakes up feeling tired, morning headaches, morning migraines, denies taking naps.     Epworth Sleepiness Scale 0= would  never doze 1= slight chance of dozing 2= moderate chance of dozing 3= high chance of dozing  Sitting and reading:0 Watching TV:0 Sitting inactive in a public place (ex. Theater or meeting):0 As a passenger in a car for an hour without a break:0 Lying down to rest in the afternoon:3 Sitting and talking to someone:0 Sitting quietly after lunch (no alcohol):1 In a car, while stopped in traffic:0 Total:4   Objective:  Neurologic Exam  Physical Exam Physical Examination:   Vitals:   10/25/16 1545  BP: 128/76  Pulse: 80  Resp: 16   General Examination: The patient is a very pleasant 66 y.o. male in no acute distress. He appears well-developed and well-nourished and well groomed.   HEENT: Normocephalic, atraumatic, pupils are equal, round and reactive to light and accommodation. Mild b/l cataracts, funduscopic exam is normal with sharp disc margins noted. Extraocular tracking is good without limitation to gaze excursion or nystagmus noted. Normal smooth pursuit is noted. Hearing is grossly intact. Face is symmetric with normal facial animation and normal facial sensation. Speech is clear with no dysarthria noted. There is no hypophonia. There is no lip, neck/head, jaw or voice tremor. Neck is supple with full range of passive and active motion. There are no carotid bruits on auscultation. Oropharynx exam reveals: mild mouth dryness, good dental hygiene and mild airway crowding, due to tonsils in place and slightly larger uvula. Mallampati is class II. Tongue protrudes centrally and palate elevates symmetrically. Tonsils are 1+ to 2+ in size/absent. Neck size is 16 3/8 inches. He has a Mild overbite. Nasal inspection reveals no significant nasal mucosal bogginess or redness and no septal deviation.   Chest: Clear to auscultation without wheezing, rhonchi or crackles noted.  Heart: S1+S2+0, regular and normal without murmurs, rubs or gallops noted.   Abdomen: Soft, non-tender and  non-distended with normal bowel sounds appreciated on auscultation.  Extremities: There is no pitting edema in the distal lower extremities bilaterally. Pedal pulses are intact.  Skin: Warm and dry without trophic changes noted.  Musculoskeletal: exam reveals no obvious joint deformities, tenderness or joint swelling or erythema.   Neurologically:  Mental status: The patient  is awake, alert and oriented in all 4 spheres. His immediate and remote memory, attention, language skills and fund of knowledge are appropriate. There is no evidence of aphasia, agnosia, apraxia or anomia. Speech is clear with normal prosody and enunciation. Thought process is linear. Mood is normal and affect is normal.  Cranial nerves II - XII are as described above under HEENT exam. In addition: shoulder shrug is normal with equal shoulder height noted. Motor exam: Normal bulk, strength and tone is noted. There is no drift, tremor or rebound. Romberg is negative. Reflexes are 2+ throughout. Fine motor skills and coordination: intact with normal finger taps, normal hand movements, normal rapid alternating patting, normal foot taps and normal foot agility.  Cerebellar testing: No dysmetria or intention tremor on finger to nose testing. Heel to shin is difficult for him. There is no truncal or gait ataxia.  Sensory exam: intact to light touch, pinprick, vibration, temperature sense in the upper and lower extremities.  Gait, station and balance: He stands easily. No veering to one side is noted. No leaning to one side is noted. Posture is age-appropriate and stance is narrow based. Gait shows normal stride length and normal pace. No problems turning are noted. Tandem walk is unremarkable.            Assessment and Plan:   In summary, RUMEAL UNDERKOFFLER is a very pleasant 66 y.o.-year old male with an underlying medical history of allergic rhinitis, degenerative disc disease, reflux disease, chronic migraines, history of kidney  stone, history of skin cancer, TMJ disorder, hyperlipidemia, status post multiple surgeries including right total knee replacement surgery, right-sided shoulder surgery, left shoulder replacement surgery, right shoulder replacement surgery, lower back surgery, and borderline obesity, who reports snoring, sleep disruption, nocturia, uncontrolled morning headaches and nonrestorative sleep, his history and physical exam are indeed concerning for underlying obstructive sleep apnea. This could be a contributor to his headache exacerbation and morning headaches.  I had a long chat with the patient about my findings and the diagnosis of OSA, its prognosis and treatment options. We talked about medical treatments, surgical interventions and non-pharmacological approaches. I explained in particular the risks and ramifications of untreated moderate to severe OSA, especially with respect to developing cardiovascular disease down the Road, including congestive heart failure, difficult to treat hypertension, cardiac arrhythmias, or stroke. Even type 2 diabetes has, in part, been linked to untreated OSA. Symptoms of untreated OSA include daytime sleepiness, memory problems, mood irritability and mood disorder such as depression and anxiety, lack of energy, as well as recurrent headaches, especially morning headaches. We talked about trying to maintain a healthy lifestyle in general, as well as the importance of weight control. I encouraged the patient to eat healthy, exercise daily and keep well hydrated, to keep a scheduled bedtime and wake time routine, to not skip any meals and eat healthy snacks in between meals. I advised the patient not to drive when feeling sleepy. I also counseled him on the dangers of using triptans on a daily basis and overusing triptans. We also talked briefly about the potential use of botulinum toxin injections for uncontrolled migraine headaches. He has already started the conversation with you  and is encouraged to talk to you about this.  I recommended the following at this time: sleep study  with potential positive airway pressure titration. (We will score hypopneas at 4% and split the sleep study into diagnostic and treatment portion, if he has evidence of severe OSA).  I explained the sleep test procedure to the patient and also outlined possible surgical and non-surgical treatment options of OSA, including the use of a custom-made dental device (which would require a referral to a specialist dentist or oral surgeon), upper airway surgical options, such as pillar implants, radiofrequency surgery, tongue base surgery, and UPPP (which would involve a referral to an ENT surgeon). Rarely, jaw surgery such as mandibular advancement may be considered.  I also explained the CPAP treatment option to the patient, who indicated that he would be willing to try CPAP if the need arises. I explained the importance of being compliant with PAP treatment, not only for insurance purposes but primarily to improve His symptoms, and for the patient's long term health benefit, including to reduce His cardiovascular risks. I answered all his questions today and the patient was in agreement. I would like to see him back after the sleep study is completed and encouraged him to call with any interim questions, concerns, problems or updates.   Thank you very much for allowing me to participate in the care of this nice patient. If I can be of any further assistance to you please do not hesitate to talk to me.  Sincerely,   [name], MD, PhD

## 2016-10-25 NOTE — Telephone Encounter (Signed)
Called and spoke to pt. He will come at 230pm to do migraine infusion and then see Dr Rexene Alberts. He will not receive compazine because he will be coming by himself. His wife is at work. Advised I will let Dr Guadelupe Sabin nurse know as well.  He can always check in for Dr Guadelupe Sabin appt before going back with Otila Kluver for infusion so hell be ready for his appt. He verbalized understanding and appreciation.

## 2016-10-25 NOTE — Telephone Encounter (Signed)
Pt called said this migraine started at 4:30 am today and is the worst migraine. He said he did not take any medication, he feels he could be going thru withdrawal with sumatriptan. [name] [name] was able to take the call

## 2016-10-25 NOTE — Telephone Encounter (Addendum)
Took call from phone staff. Spoke to patient. He stated he tried taking sumatriptan the last couple days. He stated "it felt like my head was going to explode". He is not sure what else to do.  Advised per AA,MD she would like to do migraine infusion. Went over medications we give: depacon, toradol, compazine, and steroids.  He does not have anyone that can drive him. He feels uncomfortable with driving himself but states "I will if I have to". He states he feels better than he did this morning. His wife is at work and he does not have anyone else that can drive him.  Advised he could be going through withdrawal from sumatriptan. Headache frequency could increase for a couple weeks since decreasing the amount of sumatriptan he is taking.  He has appt with Dr Rexene Alberts today at 330pm. Advised I spoke with Otila Kluver in intrafusion and she is going to check her schedule to see what she can do. I will call him back to let him know. He verbalized understanding.   Per AA,MD can do infusion after appt with Dr Rexene Alberts if possible, or she can see him and do nerve blocks possibly.

## 2016-10-26 ENCOUNTER — Encounter: Payer: Self-pay | Admitting: Neurology

## 2016-11-27 ENCOUNTER — Ambulatory Visit (INDEPENDENT_AMBULATORY_CARE_PROVIDER_SITE_OTHER): Payer: Medicare HMO | Admitting: Neurology

## 2016-11-27 DIAGNOSIS — G4733 Obstructive sleep apnea (adult) (pediatric): Secondary | ICD-10-CM

## 2016-11-29 NOTE — Progress Notes (Signed)
Patient referred by Dr. Jaynee Eagles, seen by me on 10/25/16, HST on 11/27/16:  Please call and notify the patient that the recent home sleep test did suggest the diagnosis of obstructive sleep apnea and given his med Hx and sleep related complaints, I recommend treatment for this in the form of CPAP. I will request an overnight sleep study for proper titration and mask fitting. Please explain to patient and arrange for a CPAP titration study. I have placed an order in the chart. Thanks, and please route to [facility] for scheduling.   [name], MD, PhD [facility])

## 2016-11-29 NOTE — Procedures (Signed)
  [facility] [address], [place], [place] [address]  NAME: [name] [name] DOB: 1951/04/14 MEDICAL RECORD [id number] DOS: 11/28/16 REFERRING PHYSICIAN: Mariella Saa  Study Performed:  HST/Out of Center Sleep Test  HISTORY: 66 year old right-handed gentleman with an underlying medical history of allergic rhinitis, degenerative disc disease, reflux disease, chronic migraines, history of kidney stone, history of skin cancer, TMJ disorder, hyperlipidemia, status post multiple surgeries including right total knee replacement surgery, right-sided shoulder surgery, left shoulder replacement surgery, right shoulder replacement surgery, lower back surgery, and borderline obesity, who reports snoring and excessive daytime somnolence as well as morning headaches and waking up not refreshed. Epworth sleepiness score is 4 out of 24, fatigue score is 12 out of 63.  STUDY RESULTS:  Total Recording Time: 7h 29 m, Duration: 6  h   Total Apnea/Hypopnea Index (AHI): 7.8/hour  Average Oxygen Saturation: 91%  Lowest Oxygen Saturation: 85%  Average Mean Heart Rate: 63 bmp  IMPRESSION: Obstructive Sleep Apnea (OSA)    RECOMMENDATION:  This home sleep test demonstrates obstructive sleep apnea with a total AHI of 7.8/hour and O2 nadir of 85%. Given the patient's medical history and sleep related complaints, treatment with positive airway pressure (in the form of CPAP) is recommended. This will require a full night CPAP titration study for proper treatment settings and mask fitting. Please note that untreated obstructive sleep apnea carries additional perioperative morbidity. Patients with significant obstructive sleep apnea should receive perioperative PAP therapy and the surgeons and particularly the anesthesiologist should be informed of the diagnosis and the severity of the sleep disordered breathing. The patient should be cautioned not to drive, work at  heights, or operate dangerous or heavy equipment when tired or sleepy. Review and reiteration of good sleep hygiene measures should be pursued with any patient. The patient and his referring provider will be notified of the test results. The patient will be seen in follow up in sleep clinic at [facility].  I certify that I have reviewed the raw data recording prior to the issuance of this report in accordance with the standards of Accreditation of the American Academy of Sleep medicine (AASM).      [name], MD, PhD [facility]) Diplomat, ABPN (neurology and sleep)

## 2016-11-29 NOTE — Addendum Note (Signed)
Addended by: [name] on: 11/29/2016 08:29 AM   Modules accepted: Orders

## 2016-12-01 ENCOUNTER — Telehealth: Payer: Self-pay

## 2016-12-01 NOTE — Telephone Encounter (Signed)
I spoke to patient and he is aware of results and recommendations. He states that he did research on this and is unsure if he needs to be treated. He reported that he is will his daughter right now who just had a baby and he will call back if he needs [name].

## 2016-12-01 NOTE — Telephone Encounter (Signed)
-----   Message from [name], MD sent at 11/29/2016  8:28 AM EST ----- Patient referred by Dr. Jaynee Eagles, seen by me on 10/25/16, HST on 11/27/16:  Please call and notify the patient that the recent home sleep test did suggest the diagnosis of obstructive sleep apnea and given his med Hx and sleep related complaints, I recommend treatment for this in the form of CPAP. I will request an overnight sleep study for proper titration and mask fitting. Please explain to patient and arrange for a CPAP titration study. I have placed an order in the chart. Thanks, and please route to [facility] for scheduling.   [name], MD, PhD [facility])

## 2017-01-01 ENCOUNTER — Telehealth: Payer: Self-pay

## 2017-01-01 NOTE — Telephone Encounter (Signed)
Patient cancelled cpap titration study. Does not want to have this procedure.

## 2017-01-04 ENCOUNTER — Encounter: Payer: Self-pay | Admitting: Neurology

## 2017-01-04 ENCOUNTER — Ambulatory Visit (INDEPENDENT_AMBULATORY_CARE_PROVIDER_SITE_OTHER): Payer: Medicare HMO | Admitting: Neurology

## 2017-01-04 VITALS — BP 114/74 | HR 89 | Ht 69.0 in | Wt 219.4 lb

## 2017-01-04 DIAGNOSIS — R51 Headache: Secondary | ICD-10-CM | POA: Diagnosis not present

## 2017-01-04 DIAGNOSIS — R519 Headache, unspecified: Secondary | ICD-10-CM

## 2017-01-04 NOTE — Progress Notes (Signed)
[facility]    Provider:  Dr Jaynee Eagles Referring Provider: Marletta Lor, MD Primary Care Physician:  Nyoka Cowden, MD  CC:  Morning headaches  Interval history 01/04/2017: Patient following up for morning headaches. MRI brain was normal. Patient has OSA diagnosed here at [facility] but he declined to be treated despite headaches waking him up at night. I have highly encouraged him to use a cpap as this may resolve his nocturnal and morning headaches and also warned of the  sequelae of untreated sleep apnea such as increased risk of stroke. He takes tylenol PM every night, cymbalta, He is on Amitriptyline 75mg  at night. He has been to PT for his musculoskeletal neck pain.   Tried: Amitriptyline, Topamax, Cymbalta  Has not tried: Depakote, propranolol, Indomethacin,   HPI:  [name] [name] is a 66 y.o. male here as a referral from Dr. Burnice Logan for migraines. PMHx migraines. He has had migraines/headaches for 10 years. He travels a lot and has significant osteoarthritis. He has had shoulder replacements and back fusion of l4-l5. His skeletal issues are a migraine trigger. All his migraines are in the middle of the night, he wakes up with headaches 95% of the time. He has seen a lot of doctors. Sumatriptan helps with the migraines.  He has been to many doctors. He saw Dr. Melton Alar for headaches. He has daily headaches. He was taking Sumatriptan daily but now only 9 times a month and no medication overuse. Headaches start in the neck, feels like he is going to explode, all over the head, so severe he can't even look at things, painful pressure all over the head, in his eyes behind his eyes, stiffness and tense, his head is pounding, nausea, extreme light and sound sensitivity. He snores, he doesn't wake up from snoring. No aura. He will also get migraines during the day if he is not conscious of his posture. Migraines last 8 hours. He is terrified to go to bed at night  because he wakes up with headaches. No vision changes, speech changes, new weakness or sensory changes. No other focal neurologic deficits, associated symptoms, inciting events or modifiable factors.  Tried: Topiramate, Elavil, acetaminophen, imitrex   Reviewed notes, labs and imaging from outside physicians, which showed:  Reviewed primary care notes and notes from [facility] center. Patient has a history of migraines. Migraines have been refractory. These responded nicely to sumatriptan but his insurance company has switched to an alternate triptan and he is quite limited in his monthly supply. He is under considerable stress recently. He tried Topamax which she did not tolerate well and recently discontinued. He is previous s/p posterior lumbar interbody fusion at L4-L5 performed on 10-07-15 with noted degenerative changes at L3-L4 with collapse to the right and noted degenerative scoliosis. He has right low back pain and right hip pain that is constant and it hurts in any position. He denies any radicular pain or numbness or weakness. He has a progressive collapse of the disc space at L3-4 that is eccentric and now accentuating the lateral listhesis at L2-3. The MR shows obvious endplate inflammation as well so they will start with ESI efforts and see if this helps.If not, he will need an extension of his fusion with correciton of the deformity up to L2.Marland Kitchen  MRI LUMBAR SPINE WITHOUT CONTRAST, 08/16/2016 10:51 AM   INDICATION: LOW BACK PAIN, >6 WKS / RED FLAG(S) / RADICULOPATHY \ \ Z98.1 S/P lumbar fusion \ M54.5 Spine pain, lumbar  COMPARISON: Lumbar spine radiograph 07/18/2016 and CT myelogram 07/12/2015   TECHNIQUE: Multiplanar, multi-sequence surface-coil MR imaging of the lumbar spine was performed without contrast.   LEVELS IMAGED: Lower thoracic to upper sacral region.   FINDINGS:  Alignment: Similar grade 1 anterolisthesis of L4 and L5. Trace retrolisthesis of L2 on L3,  similar to prior.Similar levoscoliotic curvature of the spine. Vertebrae: There are fatty endplate degenerative changes at L4-L5. Increased T2/STIR signal within the endplates at B5-Z0 and the inferior endplate of L2, compatible with edema/inflammation, likely degenerative in etiology. Redemonstrated postsurgical changes of posterior lumbar interbody fusion at L4-L5 with pedicle screw and rod internal fixation. Paraspinal soft tissues: Increased T2/STIR signal within the paraspinal musculature adjacent to the operative site, which is favored to reflect postsurgical changes. Conus: Normal in signal and position, terminating at the superior aspect of L2   T12-L1: No significant focal abnormality. L1-L2: No significant focal abnormality. L2-L3: Similar trace retrolisthesis of L2 on L3 with slight uncovering of the disc. There is a left eccentric disc bulge with disc desiccation and height loss. Facet hypertrophy with ligamentum flavum thickening and facet effusions bilaterally. There is mild bilateral foraminal narrowing L3-L4: There is marked narrowing of the right aspect of the disc space at L3-4 with surrounding high STIR, low T1 signal compatible with edema, likely related to progressive degenerative changes at this level. Broad-based disc bulge with facet hypertrophy and ligamentum flavum thickening. There is moderate right lateral recess narrowing. There is moderate right foraminal narrowing and mild left foraminal narrowing. L4-L5: Grade one anterolisthesis of L4 and L5, with slight uncovering of the disc. There is facet hypertrophy and ligamentum flavum thickening without canal narrowing. There is soft tissue extending into the foramen on the right at L4-L5.  L5-S1: Left eccentric disc bulge. There is moderate left foraminal narrowing. No significant canal narrowing.    Review of Systems: Patient complains of symptoms per HPI as well as the following symptoms:No CP, no SOB. Pertinent  negatives per HPI. All others negative.     Social History   Social History  . Marital status: Married    Spouse name: N/A  . Number of children: N/A  . Years of education: N/A   Occupational History  . Not on file.   Social History Main Topics  . Smoking status: Never Smoker  . Smokeless tobacco: Never Used  . Alcohol use No  . Drug use: No  . Sexual activity: Yes    Partners: Female   Other Topics Concern  . Not on file   Social History Narrative   Lives at home    Family History  Problem Relation Age of Onset  . COPD Father     family hx  . Aneurysm Mother   . Cancer Maternal Grandfather     lung    Past Medical History:  Diagnosis Date  . ALLERGIC RHINITIS 04/22/2007  . Cancer (HCC)    HX SKIN CANCER  . Complication of anesthesia    more concerned about positioning of head & neck because of TMJ  . DDD (degenerative disc disease), cervical   . GERD (gastroesophageal reflux disease)    infrequently  . Headache(784.0)    migraines  . History of kidney stones   . History of skin cancer   . History of TMJ disorder   . HYPERCHOLESTEROLEMIA 04/22/2007  . HYPERLIPIDEMIA 07/29/2007  . Hyperplastic colon polyp   . NEPHROLITHIASIS, HX OF 07/29/2007  . Osteoarth NOS-Unspec 04/22/2007  . Shoulder pain, acute  bilateral    Past Surgical History:  Procedure Laterality Date  . APPENDECTOMY    . JOINT REPLACEMENT    . KNEE ARTHROSCOPY     right x2  . SHOULDER SURGERY     right  . TOTAL KNEE ARTHROPLASTY Right 08/25/2013   Procedure: RIGHT TOTAL KNEE ARTHROPLASTY;  Surgeon: Gearlean Alf, MD;  Location: [facility];  Service: Orthopedics;  Laterality: Right;  . TOTAL SHOULDER ARTHROPLASTY Left 12/25/2013   DR SUPPLE   . TOTAL SHOULDER ARTHROPLASTY Left 12/25/2013   Procedure: LEFT TOTAL SHOULDER ARTHROPLASTY;  Surgeon: Marin Shutter, MD;  Location: [place];  Service: Orthopedics;  Laterality: Left;  . TOTAL SHOULDER ARTHROPLASTY Right 06/18/2014   DR SUPPLE  .  TOTAL SHOULDER ARTHROPLASTY Right 06/18/2014   Procedure: RIGHT TOTAL SHOULDER ARTHROPLASTY;  Surgeon: Marin Shutter, MD;  Location: [place];  Service: Orthopedics;  Laterality: Right;    Current Outpatient Prescriptions  Medication Sig Dispense Refill  . allopurinol (ZYLOPRIM) 100 MG tablet TAKE 1 TABLET (100 MG TOTAL) BY MOUTH DAILY. 90 tablet 3  . amitriptyline (ELAVIL) 25 MG tablet TAKE 1 TABLET THREE TIMES DAILY 270 tablet 3  . aspirin 81 MG tablet Take 81 mg by mouth daily.    . Cholecalciferol (VITAMIN D PO) Take 1 tablet by mouth daily.    . DULoxetine (CYMBALTA) 60 MG capsule Take 1 capsule (60 mg total) by mouth daily. 30 capsule 12  . Multiple Vitamin (MULTIVITAMIN) tablet Take 1 tablet by mouth daily.    . Omega-3 Fatty Acids (FISH OIL) 1000 MG CAPS Take 1 capsule by mouth daily.    . pravastatin (PRAVACHOL) 40 MG tablet TAKE 1 TABLET EVERY DAY 90 tablet 3  . Psyllium (METAMUCIL PO) Take 2 tablets by mouth 2 (two) times daily.    . Red Yeast Rice Extract (RED YEAST RICE PO) Take 1 tablet by mouth daily.    . rizatriptan (MAXALT) 10 MG tablet Take 1 tablet (10 mg total) by mouth as needed for migraine. May repeat in 2 hours if needed 30 tablet 4  . SUMAtriptan (IMITREX) 100 MG tablet Take 1 tablet (100 mg total) by mouth once as needed. May repeat in 2 hours if headache persists or recurs. 10 tablet 12  . tiZANidine (ZANAFLEX) 4 MG capsule Take 4 mg by mouth 3 (three) times daily as needed.     . topiramate (TOPAMAX) 100 MG tablet Take 1 tablet (100 mg total) by mouth 2 (two) times daily. 60 tablet 1  . topiramate (TOPAMAX) 25 MG capsule Take 1 capsule (25 mg total) by mouth 2 (two) times daily. 30 capsule 0  . topiramate (TOPAMAX) 50 MG tablet Take 1 tablet (50 mg total) by mouth 2 (two) times daily. 30 tablet 0  . TURMERIC CURCUMIN PO Take 1,050 mg by mouth daily.    . valACYclovir (VALTREX) 500 MG tablet Take 500 mg by mouth daily.    . vitamin C (ASCORBIC ACID) 500 MG tablet Take  500 mg by mouth daily.    Marland Kitchen zolpidem (AMBIEN) 10 MG tablet Take 1 tablet (10 mg total) by mouth at bedtime as needed for sleep. 30 tablet 2   No current facility-administered medications for this visit.     Allergies as of 01/04/2017 - Review Complete 10/25/2016  Allergen Reaction Noted  . Shrimp [shellfish allergy] Anaphylaxis and Rash 05/22/2012  . Codeine Rash and Other (See Comments)   . Contrast media [iodinated diagnostic agents] Rash 11/11/2015  .  Diazepam Rash 06/26/2014  . Amoxicillin Other (See Comments)   . Erythromycin Other (See Comments)   . Betadine [povidone iodine] Rash 05/22/2012  . Celebrex [celecoxib] Rash 05/22/2012  . Meperidine and related Rash 05/22/2012  . Methocarbamol Rash 05/22/2012  . Oxycodone Rash 10/14/2015  . Penicillins Rash   . Plavix [clopidogrel] Rash 12/11/2013  . Rivaroxaban Rash 10/20/2013  . Vioxx [rofecoxib] Rash 05/22/2012    Vitals: BP 114/74   Pulse 89   Ht 5\' 9"  (1.753 m)   Wt 219 lb 6.4 oz (99.5 kg)   BMI 32.40 kg/m  Last Weight:  Wt Readings from Last 1 Encounters:  01/04/17 219 lb 6.4 oz (99.5 kg)   Last Height:   Ht Readings from Last 1 Encounters:  01/04/17 5\' 9"  (1.753 m)   Physical exam: Exam: Gen: NAD, conversant, well nourised, obese, well groomed                     CV: RRR, no MRG. No Carotid Bruits. No peripheral edema, warm, nontender Eyes: Conjunctivae clear without exudates or hemorrhage  Neuro: Detailed Neurologic Exam  Speech:    Speech is normal; fluent and spontaneous with normal comprehension.  Cognition:    The patient is oriented to person, place, and time;  Cranial Nerves:    The pupils are equal, round, and reactive to light. The fundi are normal and spontaneous venous pulsations are present. Visual fields are full to finger confrontation. Extraocular movements are intact. Trigeminal sensation is intact and the muscles of mastication are normal. The face is symmetric. The palate elevates in the  midline. Hearing intact. Voice is normal. Shoulder shrug is normal. The tongue has normal motion without fasciculations.   Motor Observation:    No asymmetry, no atrophy, and no involuntary movements noted. Tone:    Normal muscle tone.    Posture:    Posture is normal. normal erect    Strength:    Strength is V/V in the upper and lower limbs.       Assessment/Plan:  66 year old with severe morning headaches  Morning headaches daily: Sleep study showed sleep apnea but patient declines treatment and also denies having sleep apnea regardless of the sleep test results. I did let patient know OSA can cause nocturnal or morning headaches. He still declines.  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 Will try melatonin in the evenings, stop tylenol PM. Can also try caffeine before bed.   He has been to [facility] and did not have a good experience  Discussed: To prevent or relieve headaches, try the following:  Cool Compress. Lie down and place a cool compress on your head.   Avoid headache triggers. If certain foods or odors seem to have triggered your migraines in the past, avoid them. A headache diary might help you identify triggers.   Include physical activity in your daily routine. Try a daily walk or other moderate aerobic exercise.   Manage stress. Find healthy ways to cope with the stressors, such as delegating tasks on your to-do list.   Practice relaxation techniques. Try deep breathing, yoga, massage and visualization.   Eat regularly. Eating regularly scheduled meals and maintaining a healthy diet might help prevent headaches. Also, drink plenty of fluids.   Follow a regular sleep schedule. Sleep deprivation might contribute to headaches  Consider biofeedback. With this mind-body technique, you learn  to control certain bodily functions - such as muscle tension,  heart rate and blood pressure - to prevent headaches or reduce headache pain.    Proceed to emergency room if you experience new or worsening symptoms or symptoms do not resolve, if you have new neurologic symptoms or if headache is severe, or for any concerning symptom.     Sarina Ill, MD  [facility] [address] [place], [place] [address] Phone [phone] Fax [phone]  A total of 1minutes was spent face-to-face with this patient. Over half this time was spent on counseling patient on the hypnic headaches diagnosis and different diagnostic and therapeutic options available.

## 2017-01-04 NOTE — Patient Instructions (Signed)
Remember to drink plenty of fluid, eat healthy meals and do not skip any meals. Try to eat protein with a every meal and eat a healthy snack such as fruit or nuts in between meals. Try to keep a regular sleep-wake schedule and try to exercise daily, particularly in the form of walking, 20-30 minutes a day, if you can.   As far as your medications are concerned, I would like to suggest: Stop Tylenol 3 and take Melatonin 5-10 mg before bedtime.   I would like to see you back in 8 weeks, sooner if we need to. Please call us with any interim questions, concerns, problems, updates or refill requests.   Our phone number is [phone]. We also have an after hours call service for urgent matters and there is a physician on-call for urgent questions. For any emergencies you know to call 911 or go to the nearest emergency room

## 2017-01-16 ENCOUNTER — Encounter: Payer: Self-pay | Admitting: Neurology

## 2017-01-26 DIAGNOSIS — M79644 Pain in right finger(s): Secondary | ICD-10-CM | POA: Diagnosis not present

## 2017-01-26 DIAGNOSIS — M79645 Pain in left finger(s): Secondary | ICD-10-CM | POA: Diagnosis not present

## 2017-02-14 ENCOUNTER — Encounter: Payer: Self-pay | Admitting: Neurology

## 2017-02-22 ENCOUNTER — Telehealth: Payer: Self-pay | Admitting: Neurology

## 2017-02-22 NOTE — Telephone Encounter (Signed)
[name] [name], patient would like to try Erenumab, would you order it for him please 140mg  and see if medicare will approve? Also please call patient to get him to sign the release form thanks!

## 2017-02-23 NOTE — Telephone Encounter (Signed)
Erenumab Service Request Form and Rx completed, awaiting MD and patient signature. Pt notified via [facility]. Form placed up front for pt signature.

## 2017-02-24 ENCOUNTER — Other Ambulatory Visit: Payer: Self-pay | Admitting: Internal Medicine

## 2017-03-14 ENCOUNTER — Telehealth: Payer: Self-pay | Admitting: *Deleted

## 2017-03-14 NOTE — Telephone Encounter (Signed)
[employer] form successfully faxed in.

## 2017-03-20 ENCOUNTER — Encounter: Payer: Self-pay | Admitting: Neurology

## 2017-03-28 ENCOUNTER — Other Ambulatory Visit: Payer: Self-pay | Admitting: Internal Medicine

## 2017-04-06 ENCOUNTER — Encounter: Payer: Self-pay | Admitting: Neurology

## 2017-04-10 ENCOUNTER — Ambulatory Visit: Payer: Medicare HMO | Admitting: Neurology

## 2017-04-12 ENCOUNTER — Encounter: Payer: Self-pay | Admitting: Neurology

## 2017-04-13 DIAGNOSIS — B999 Unspecified infectious disease: Secondary | ICD-10-CM | POA: Diagnosis not present

## 2017-04-13 DIAGNOSIS — L231 Allergic contact dermatitis due to adhesives: Secondary | ICD-10-CM | POA: Diagnosis not present

## 2017-04-16 ENCOUNTER — Other Ambulatory Visit: Payer: Self-pay | Admitting: Internal Medicine

## 2017-04-26 ENCOUNTER — Encounter: Payer: Self-pay | Admitting: Neurology

## 2017-04-26 ENCOUNTER — Ambulatory Visit (INDEPENDENT_AMBULATORY_CARE_PROVIDER_SITE_OTHER): Payer: Medicare HMO | Admitting: Neurology

## 2017-04-26 VITALS — BP 137/88 | HR 86 | Ht 69.0 in | Wt 221.0 lb

## 2017-04-26 DIAGNOSIS — E669 Obesity, unspecified: Secondary | ICD-10-CM | POA: Diagnosis not present

## 2017-04-26 DIAGNOSIS — G43001 Migraine without aura, not intractable, with status migrainosus: Secondary | ICD-10-CM

## 2017-04-26 DIAGNOSIS — G4733 Obstructive sleep apnea (adult) (pediatric): Secondary | ICD-10-CM

## 2017-04-26 DIAGNOSIS — M5440 Lumbago with sciatica, unspecified side: Secondary | ICD-10-CM

## 2017-04-26 NOTE — Progress Notes (Addendum)
[facility]    Provider:  Dr Jaynee Eagles Referring Provider: Marletta Lor, MD Primary Care Physician:  Marletta Lor, MD   CC: Morning headaches  Interval history 04/26/2017: patient is following for nocturnal and morning headaches. Has been diagnosed with sleep apnea but refuses CPAP machine and despite discussions that this could be the cause of his headaches. These tried multiple medications as below, we've tried treatments for migraines as well as possibly hypnic headaches. We've tried the new CGRP monoclonal antibody Aimovig. He has had a migraine every day for the last 2 weeks. Happening at night about 1/2 the time and now more during the day associated with back pain. He is taking melatonin, amitriptyline, cymbalta and flexeril at night. Recommend seeing Dr. Rexene Alberts to discuss sleep study in detail which showed sleep apnea.   Interval history 01/04/2017: Patient following up for morning headaches. MRI brain was normal. Patient has OSA diagnosed here at [facility] but he declined to be treated despite headaches waking him up at night. I have highly encouraged him to use a cpap as this may resolve his nocturnal and morning headaches and also warned of the  sequelae of untreated sleep apnea such as increased risk of stroke. He takes tylenol PM every night, cymbalta, He is on Amitriptyline 75mg  at night. He has been to PT for his musculoskeletal neck pain.   Tried: Amitriptyline, Topamax, Cymbalta, Rizatriptan, Aimovig, Melatonin, Coffe before bed (possibly hypnic headaches),   Has not tried: Depakote, propranolol, Indomethacin,   DEY:CXKGY [name] a 66 y.o.malehere as a referral from Dr. Raynelle Chary migraines. PMHx migraines. He has had migraines/headachesfor 10 years. He travels a lot and has significant osteoarthritis. He has had shoulder replacements and back fusion of l4-l5. His skeletal issues are a migraine trigger. All his migraines are in the  middle of the night, he wakes up with headaches 95% of the time. He has seen a lot of doctors. Sumatriptan helps with the migraines. He has been to many doctors. He saw Dr. Melton Alar for headaches. He has daily headaches. He was taking Sumatriptan daily but now only 9 times a month and no medication overuse. Headaches start in the neck, feels like he is going to explode, all over the head, so severe he can't even look at things, painful pressure all over the head, in his eyes behind his eyes, stiffness and tense, his head is pounding, nausea, extreme light and sound sensitivity. He snores, he doesn't wake up from snoring. No aura. He will also get migraines during the day if he is not conscious of his posture. Migraines last 8 hours. He is terrified to go to bed at night because he wakes up with headaches. No vision changes, speech changes, new weakness or sensory changes. No other focal neurologic deficits, associated symptoms, inciting events or modifiable factors.  Tried: Topiramate, Elavil, acetaminophen, imitrex   Reviewed notes, labs and imaging from outside physicians, which showed:  Reviewed primary care notes and notes from [facility] spine center. Patient has a history of migraines. Migraines have been refractory. These responded nicely to sumatriptan but his insurance company has switched to an alternate triptan and he is quite limited in his monthly supply. He is under considerable stress recently. He tried Topamax which she did not tolerate well and recently discontinued. He is previous s/p posterior lumbar interbody fusion at L4-L5 performed on 10-07-15 with noted degenerative changes at L3-L4 with collapse to the right and noted degenerative scoliosis. He has right low  back pain and right hip pain that is constant and it hurts in any position. He denies any radicular pain or numbness or weakness. He has a progressive collapse of the disc space at L3-4 that is eccentric and now accentuating  the lateral listhesis at L2-3. The MR shows obvious endplate inflammation as well so they will start with ESI efforts and see if this helps.If not, he will need an extension of his fusion with correciton of the deformity up to L2.Marland Kitchen  MRI LUMBAR SPINE WITHOUT CONTRAST, 08/16/2016 10:51 AM   INDICATION: LOW BACK PAIN, >6 WKS / RED FLAG(S) / RADICULOPATHY \ \ Z98.1 S/P lumbar fusion \ M54.5 Spine pain, lumbar   COMPARISON: Lumbar spine radiograph 07/18/2016 and CT myelogram 07/12/2015   TECHNIQUE: Multiplanar, multi-sequence surface-coil MR imaging of the lumbar spine was performed without contrast.   LEVELS IMAGED: Lower thoracic to upper sacral region.   FINDINGS:  Alignment: Similar grade 1 anterolisthesis of L4 and L5. Trace retrolisthesis of L2 on L3, similar to prior.Similar levoscoliotic curvature of the spine. Vertebrae: There are fatty endplate degenerative changes at L4-L5. Increased T2/STIR signal within the endplates at O8-C1 and the inferior endplate of L2, compatible with edema/inflammation, likely degenerative in etiology. Redemonstrated postsurgical changes of posterior lumbar interbody fusion at L4-L5 with pedicle screw and rod internal fixation. Paraspinal soft tissues: Increased T2/STIR signal within the paraspinal musculature adjacent to the operative site, which is favored to reflect postsurgical changes. Conus: Normal in signal and position, terminating at the superior aspect of L2  T12-L1: No significant focal abnormality. L1-L2: No significant focal abnormality. L2-L3: Similar trace retrolisthesis of L2 on L3 with slight uncovering of the disc. There is a left eccentric disc bulge with disc desiccation and height loss. Facet hypertrophy with ligamentum flavum thickening and facet effusions bilaterally. There is mild bilateral foraminal narrowing L3-L4: There is marked narrowing of the right aspect of the disc space at L3-4 with surrounding high STIR, low T1  signal compatible with edema, likely related to progressive degenerative changes at this level. Broad-based disc bulge with facet hypertrophy and ligamentum flavum thickening. There is moderate right lateral recess narrowing. There is moderate right foraminal narrowing and mild left foraminal narrowing. L4-L5: Grade one anterolisthesis of L4 and L5, with slight uncovering of the disc. There is facet hypertrophy and ligamentum flavum thickening without canal narrowing. There is soft tissue extending into the foramen on the right at L4-L5.  L5-S1: Left eccentric disc bulge. There is moderate left foraminal narrowing. No significant canal narrowing.    Review of Systems: Patient complains of symptoms per HPI as well as the following symptoms:No CP, no SOB. Pertinent negatives per HPI. All others negative.   Social History   Social History  . Marital status: Married    Spouse name: N/A  . Number of children: 1  . Years of education: N/A   Occupational History  . Not on file.   Social History Main Topics  . Smoking status: Never Smoker  . Smokeless tobacco: Never Used  . Alcohol use No  . Drug use: No  . Sexual activity: Yes    Partners: Female    Birth control/ protection: Implant   Other Topics Concern  . Not on file   Social History Narrative   Lives at home w/ his wife   Right-handed   Caffeine: 2 cups of coffee each morning    Family History  Problem Relation Age of Onset  . COPD Father  family hx  . Emphysema Father   . Aneurysm Mother   . Stroke Mother   . Cancer Maternal Grandfather        lung    Past Medical History:  Diagnosis Date  . ALLERGIC RHINITIS 04/22/2007  . Cancer (HCC)    HX SKIN CANCER  . Complication of anesthesia    more concerned about positioning of head & neck because of TMJ  . DDD (degenerative disc disease), cervical   . GERD (gastroesophageal reflux disease)    infrequently  . Headache(784.0)    migraines  . History of  kidney stones   . History of skin cancer   . History of TMJ disorder   . HYPERCHOLESTEROLEMIA 04/22/2007  . HYPERLIPIDEMIA 07/29/2007  . Hyperplastic colon polyp   . NEPHROLITHIASIS, HX OF 07/29/2007  . Osteoarth NOS-Unspec 04/22/2007  . Shoulder pain, acute    bilateral    Past Surgical History:  Procedure Laterality Date  . APPENDECTOMY    . JOINT REPLACEMENT    . KNEE ARTHROSCOPY     right x2  . SHOULDER SURGERY     right  . TOTAL KNEE ARTHROPLASTY Right 08/25/2013   Procedure: RIGHT TOTAL KNEE ARTHROPLASTY;  Surgeon: Gearlean Alf, MD;  Location: [facility];  Service: Orthopedics;  Laterality: Right;  . TOTAL SHOULDER ARTHROPLASTY Left 12/25/2013   DR SUPPLE   . TOTAL SHOULDER ARTHROPLASTY Left 12/25/2013   Procedure: LEFT TOTAL SHOULDER ARTHROPLASTY;  Surgeon: Marin Shutter, MD;  Location: [facility];  Service: Orthopedics;  Laterality: Left;  . TOTAL SHOULDER ARTHROPLASTY Right 06/18/2014   DR SUPPLE  . TOTAL SHOULDER ARTHROPLASTY Right 06/18/2014   Procedure: RIGHT TOTAL SHOULDER ARTHROPLASTY;  Surgeon: Marin Shutter, MD;  Location: [facility];  Service: Orthopedics;  Laterality: Right;    Current Outpatient Prescriptions  Medication Sig Dispense Refill  . allopurinol (ZYLOPRIM) 100 MG tablet TAKE 1 TABLET (100 MG TOTAL) BY MOUTH DAILY. 90 tablet 3  . amitriptyline (ELAVIL) 25 MG tablet TAKE 1 TABLET THREE TIMES DAILY 270 tablet 3  . aspirin 81 MG tablet Take 81 mg by mouth daily.    . Cholecalciferol (VITAMIN D PO) Take 1 tablet by mouth daily.    . DULoxetine (CYMBALTA) 60 MG capsule Take 1 capsule (60 mg total) by mouth daily. 30 capsule 12  . EPINEPHRINE 0.3 mg/0.3 mL IJ SOAJ injection INJECT 0.3 MLS INTO THE MUSCLE ONCE 2 mL 1  . Multiple Vitamin (MULTIVITAMIN) tablet Take 1 tablet by mouth daily.    . Omega-3 Fatty Acids (FISH OIL) 1000 MG CAPS Take 1 capsule by mouth daily.    . pravastatin (PRAVACHOL) 40 MG tablet TAKE 1 TABLET EVERY DAY 90 tablet 3  . pravastatin (PRAVACHOL)  40 MG tablet TAKE ONE TABLET BY MOUTH ONCE DAILY 90 tablet 0  . Psyllium (METAMUCIL PO) Take 2 tablets by mouth 2 (two) times daily.    . Red Yeast Rice Extract (RED YEAST RICE PO) Take 1 tablet by mouth daily.    . rizatriptan (MAXALT) 10 MG tablet Take 1 tablet (10 mg total) by mouth as needed for migraine. May repeat in 2 hours if needed 30 tablet 4  . SUMAtriptan (IMITREX) 100 MG tablet Take 1 tablet (100 mg total) by mouth once as needed. May repeat in 2 hours if headache persists or recurs. 10 tablet 12  . tiZANidine (ZANAFLEX) 4 MG capsule Take 4 mg by mouth 3 (three) times daily as needed.     Marland Kitchen  topiramate (TOPAMAX) 100 MG tablet Take 1 tablet (100 mg total) by mouth 2 (two) times daily. 60 tablet 1  . topiramate (TOPAMAX) 25 MG capsule Take 1 capsule (25 mg total) by mouth 2 (two) times daily. 30 capsule 0  . topiramate (TOPAMAX) 50 MG tablet Take 1 tablet (50 mg total) by mouth 2 (two) times daily. 30 tablet 0  . TURMERIC CURCUMIN PO Take 1,050 mg by mouth daily.    . valACYclovir (VALTREX) 500 MG tablet Take 500 mg by mouth daily.    . vitamin C (ASCORBIC ACID) 500 MG tablet Take 500 mg by mouth daily.    Marland Kitchen zolpidem (AMBIEN) 10 MG tablet Take 1 tablet (10 mg total) by mouth at bedtime as needed for sleep. 30 tablet 2   No current facility-administered medications for this visit.     Allergies as of 04/26/2017 - Review Complete 04/26/2017  Allergen Reaction Noted  . Shrimp [shellfish allergy] Anaphylaxis and Rash 05/22/2012  . Codeine Rash and Other (See Comments)   . Contrast media [iodinated diagnostic agents] Rash 11/11/2015  . Diazepam Rash 06/26/2014  . Amoxicillin Other (See Comments)   . Erythromycin Other (See Comments)   . Betadine [povidone iodine] Rash 05/22/2012  . Celebrex [celecoxib] Rash 05/22/2012  . Meperidine and related Rash 05/22/2012  . Methocarbamol Rash 05/22/2012  . Oxycodone Rash 10/14/2015  . Penicillins Rash   . Plavix [clopidogrel] Rash 12/11/2013    . Rivaroxaban Rash 10/20/2013  . Vioxx [rofecoxib] Rash 05/22/2012    Vitals: BP 137/88   Pulse 86   Ht 5\' 9"  (1.753 m)   Wt 221 lb (100.2 kg)   BMI 32.64 kg/m  Last Weight:  Wt Readings from Last 1 Encounters:  04/26/17 221 lb (100.2 kg)   Last Height:   Ht Readings from Last 1 Encounters:  04/26/17 5\' 9"  (1.753 m)   Physical exam: Exam: Gen: NAD, conversant, well nourised, obese, well groomed                     Eyes: Conjunctivae clear without exudates or hemorrhage  Neuro: Detailed Neurologic Exam  Speech:    Speech is normal; fluent and spontaneous with normal comprehension.  Cognition:    The patient is oriented to person, place, and time;  Cranial Nerves:    The pupils are equal, round, and reactive to light. Visual fields are full to finger confrontation. Extraocular movements are intact. Trigeminal sensation is intact and the muscles of mastication are normal. The face is symmetric. The palate elevates in the midline. Hearing intact. Voice is normal. Shoulder shrug is normal. The tongue has normal motion without fasciculations.   Motor Observation:    No asymmetry, no atrophy, and no involuntary movements noted. Tone:    Normal muscle tone.    Posture:    Posture is normal. normal erect    Strength:    Strength is V/V in the upper and lower limbs.      Assessment/Plan:66 year old with severe morning headaches, now occurring daily with migrainous features.  Morning headaches daily: Sleep study showed sleep apnea but patient declines treatment and also denies having sleep apnea regardless of the sleep test results. I did let patient know OSA can cause nocturnal or morning headaches. I reviewed the sleep evaluation with patient in detail today, advise him that at home studies often times under estimate the amount of sleep apnea and that he should really go back and talk to her sleep  team regarding his study. Patient will see Dr. Rexene Alberts and we scheduled an  appointment.  Migraines: Tried and failed multiple medications for his migraines. Patient will start Erenumab today. Discussed Botox.  Low back pain: We'll start Neurontin  MRI brain wo contrast: normal  Will continue cymbalta, discussed side effects  [facility]  He has been to [facility] and did not have a good experience Sarina Ill, MD  [facility] [address] [place],  [address]  Phone [phone] Fax [phone]  A total of 30 minutes was spent face-to-face with this patient. Over half this time was spent on counseling patient on the OSA, migraines, obesity diagnosis and different diagnostic and therapeutic options available.

## 2017-04-26 NOTE — Patient Instructions (Addendum)
Sleep Apnea Sleep apnea is a condition in which breathing pauses or becomes shallow during sleep. Episodes of sleep apnea usually last 10 seconds or longer, and they may occur as many as 20 times an hour. Sleep apnea disrupts your sleep and keeps your body from getting the rest that it needs. This condition can increase your risk of certain health problems, including:  Heart attack.  Stroke.  Obesity.  Diabetes.  Heart failure.  Irregular heartbeat.  There are three kinds of sleep apnea:  Obstructive sleep apnea. This kind is caused by a blocked or collapsed airway.  Central sleep apnea. This kind happens when the part of the brain that controls breathing does not send the correct signals to the muscles that control breathing.  Mixed sleep apnea. This is a combination of obstructive and central sleep apnea.  What are the causes? The most common cause of this condition is a collapsed or blocked airway. An airway can collapse or become blocked if:  Your throat muscles are abnormally relaxed.  Your tongue and tonsils are larger than normal.  You are overweight.  Your airway is smaller than normal.  What increases the risk? This condition is more likely to develop in people who:  Are overweight.  Smoke.  Have a smaller than normal airway.  Are elderly.  Are male.  Drink alcohol.  Take sedatives or tranquilizers.  Have a family history of sleep apnea.  What are the signs or symptoms? Symptoms of this condition include:  Trouble staying asleep.  Daytime sleepiness and tiredness.  Irritability.  Loud snoring.  Morning headaches.  Trouble concentrating.  Forgetfulness.  Decreased interest in sex.  Unexplained sleepiness.  Mood swings.  Personality changes.  Feelings of depression.  Waking up often during the night to urinate.  Dry mouth.  Sore throat.  How is this diagnosed? This condition may be diagnosed with:  A medical history.  A  physical exam.  A series of tests that are done while you are sleeping (sleep study). These tests are usually done in a sleep lab, but they may also be done at home.  How is this treated? Treatment for this condition aims to restore normal breathing and to ease symptoms during sleep. It may involve managing health issues that can affect breathing, such as high blood pressure or obesity. Treatment may include:  Sleeping on your side.  Using a decongestant if you have nasal congestion.  Avoiding the use of depressants, including alcohol, sedatives, and narcotics.  Losing weight if you are overweight.  Making changes to your diet.  Quitting smoking.  Using a device to open your airway while you sleep, such as: ? An oral appliance. This is a custom-made mouthpiece that shifts your lower jaw forward. ? A continuous positive airway pressure (CPAP) device. This device delivers oxygen to your airway through a mask. ? A nasal expiratory positive airway pressure (EPAP) device. This device has valves that you put into each nostril. ? A bi-level positive airway pressure (BPAP) device. This device delivers oxygen to your airway through a mask.  Surgery if other treatments do not work. During surgery, excess tissue is removed to create a wider airway.  It is important to get treatment for sleep apnea. Without treatment, this condition can lead to:  High blood pressure.  Coronary artery disease.  (Men) An inability to achieve or maintain an erection (impotence).  Reduced thinking abilities.  Follow these instructions at home:  Make any lifestyle changes that your health   care provider recommends.  Eat a healthy, well-balanced diet.  Take over-the-counter and prescription medicines only as told by your health care provider.  Avoid using depressants, including alcohol, sedatives, and narcotics.  Take steps to lose weight if you are overweight.  If you were given a device to open your  airway while you sleep, use it only as told by your health care provider.  Do not use any tobacco products, such as cigarettes, chewing tobacco, and e-cigarettes. If you need help quitting, ask your health care provider.  Keep all follow-up visits as told by your health care provider. This is important. Contact a health care provider if:  The device that you received to open your airway during sleep is uncomfortable or does not seem to be working.  Your symptoms do not improve.  Your symptoms get worse. Get help right away if:  You develop chest pain.  You develop shortness of breath.  You develop discomfort in your back, arms, or stomach.  You have trouble speaking.  You have weakness on one side of your body.  You have drooping in your face. These symptoms may represent a serious problem that is an emergency. Do not wait to see if the symptoms will go away. Get medical help right away. Call your local emergency services (911 in the [place] Do not drive yourself to the hospital. This information is not intended to replace advice given to you by your health care provider. Make sure you discuss any questions you have with your health care provider. Document Released: 09/01/2002 Document Revised: 05/07/2016 Document Reviewed: 06/21/2015 Elsevier Interactive Patient Education  2018 [employer].  Gabapentin extended-release tablets (Gralise) What is this medicine? GABAPENTIN (GA ba pen tin) is used to treat certain types of nerve pain. This medicine may be used for other purposes; ask your health care provider or pharmacist if you have questions. COMMON BRAND NAME(S): Gralise What should I tell my health care provider before I take this medicine? They need to know if you have any of these conditions: -kidney disease -seizures -suicidal thoughts, plans, or attempt; a previous suicide attempt by you or a family member -an unusual or allergic reaction to gabapentin, other medicines,  foods, dyes, or preservatives -pregnant or trying to get pregnant -breast-feeding How should I use this medicine? Take this medicine by mouth with a glass of water. Follow the directions on the prescription label. Take this medicine with your evening meal. Do not cut, crush, or chew this medicine. Take your medicine at regular intervals. Do not take it more often than directed. Do not stop taking except on your doctor's advice. A special MedGuide will be given to you by the pharmacist with each prescription and refill. Be sure to read this information carefully each time. Talk to your pediatrician regarding the use of this medicine in children. Special care may be needed. Overdosage: If you think you have taken too much of this medicine contact a poison control center or emergency room at once. NOTE: This medicine is only for you. Do not share this medicine with others. What if I miss a dose? If you miss a dose, take it as soon as you can. If it is almost time for your next dose, take only that dose. Do not take double or extra doses. What may interact with this medicine? Do not take this medicine with any of the following medications: -other gabapentin products (Horizant, Neurontin) This medicine may also interact with the following medications: -alcohol -antacids -  antihistamines for allergy, cough and cold -certain medicines for anxiety or sleep -certain medicines for depression or psychotic disturbances -homatropine; hydrocodone -naproxen -narcotic medicines (opiates) for pain -phenothiazines like chlorpromazine, mesoridazine, prochlorperazine, thioridazine This list may not describe all possible interactions. Give your health care provider a list of all the medicines, herbs, non-prescription drugs, or dietary supplements you use. Also tell them if you smoke, drink alcohol, or use illegal drugs. Some items may interact with your medicine. What should I watch for while using this  medicine? Tell your doctor or healthcare professional if your symptoms do not start to get better or if they get worse. Do not stop taking except on your doctor's advice. You may develop a severe reaction. Your doctor will tell you how much medicine to take. You may get drowsy or dizzy. Do not drive, use machinery, or do anything that needs mental alertness until you know how this medicine affects you. Do not stand or sit up quickly, especially if you are an older patient. This reduces the risk of dizzy or fainting spells. Alcohol may interfere with the effect of this medicine. Avoid alcoholic drinks. The use of this medicine may increase the chance of suicidal thoughts or actions. Pay special attention to how you are responding while on this medicine. Any worsening of mood, or thoughts of suicide or dying should be reported to your health care professional right away. Your mouth may get dry. Chewing sugarless gum or sucking hard candy, and drinking plenty of water may help. Contact your doctor if the problem does not go away or is severe. What side effects may I notice from receiving this medicine? Side effects that you should report to your doctor or health care professional as soon as possible: -allergic reactions like skin rash, itching or hives, swelling of the face, lips, or tongue -suicidal thoughts or other mood changes Side effects that usually do not require medical attention (report to your doctor or health care professional if they continue or are bothersome): -confusion -diarrhea -dizziness -dry mouth -loss of balance or coordination -nausea -tiredness -tremors -weight gain This list may not describe all possible side effects. Call your doctor for medical advice about side effects. You may report side effects to FDA at 1-800-FDA-1088. Where should I keep my medicine? Keep out of the reach of children. This medicine may cause accidental overdose and death if it taken by other  adults, children, or pets. Mix any unused medicine with a substance like cat litter or coffee grounds. Then throw the medicine away in a sealed container like a sealed bag or a coffee can with a lid. Do not use the medicine after the expiration date. Store at room temperature between 15 and 30 degrees C (59 and 86 degrees F). NOTE: This sheet is a summary. It may not cover all possible information. If you have questions about this medicine, talk to your doctor, pharmacist, or health care provider.  2018 Elsevier/Gold Standard (2016-02-25 10:14:51)

## 2017-04-28 ENCOUNTER — Other Ambulatory Visit: Payer: Self-pay | Admitting: Internal Medicine

## 2017-05-07 ENCOUNTER — Encounter: Payer: Self-pay | Admitting: Neurology

## 2017-05-18 ENCOUNTER — Encounter: Payer: Self-pay | Admitting: Neurology

## 2017-05-19 ENCOUNTER — Encounter: Payer: Self-pay | Admitting: Internal Medicine

## 2017-05-21 ENCOUNTER — Encounter: Payer: Self-pay | Admitting: Neurology

## 2017-05-22 DIAGNOSIS — M722 Plantar fascial fibromatosis: Secondary | ICD-10-CM | POA: Diagnosis not present

## 2017-05-23 ENCOUNTER — Ambulatory Visit: Payer: Self-pay | Admitting: Neurology

## 2017-05-23 ENCOUNTER — Telehealth: Payer: Self-pay

## 2017-05-23 NOTE — Telephone Encounter (Signed)
Late entry due to power outage from 05/22/2017:  Rn requested patient call the office on 05/22/2017, pt several mychart messages regarding Aimovig, Gralsie, and Gabapentin. Pt call stated he has already had one dosage of Aimovig in July. He was suppose to receive the other dosage between 05/08/2017 but has not heard from the company or receive it in the mail. Rn stated [employer] has receive a lot of request from our office and may be behind in sending out the medication. Rn gave pt the Aimvoig number of [phone]. Rn advised pt to call the company. Pt verbalized understanding, and stated he will call.  Pt stated Dr. Jaynee Eagles gave him samples of gralise and his last dosage will run out on Wednesday. Pt knows his insurance will not cover it. Pt was seeing Dr. Harl Bowie for back, and heel issues, and he prescribed gabapentin. PT was told by the pharmacist to not take gabapentin and gralise together because they work the same way. Rn stated he should continue taking the gralise till Wednesday when he runs out of it. Rn advised him to start taking the gabapentin that was prescribed by Dr. Harl Bowie on Thursday. Pt verbalized understanding. Marland Kitchen

## 2017-05-31 ENCOUNTER — Other Ambulatory Visit: Payer: Self-pay | Admitting: Internal Medicine

## 2017-05-31 ENCOUNTER — Encounter: Payer: Self-pay | Admitting: Neurology

## 2017-06-02 ENCOUNTER — Other Ambulatory Visit: Payer: Self-pay | Admitting: Internal Medicine

## 2017-06-03 ENCOUNTER — Telehealth: Payer: Self-pay | Admitting: Neurology

## 2017-06-03 NOTE — Telephone Encounter (Signed)
Patient emailed to my [employer] account:  Good Morning, Dr. Jaynee Eagles!!   This is [name] [name], your conundrum patient!! ??????  August was the first month that I took the AIMOVIG and also the first month to start using the sample packs of the GRALISE.    As I think you are aware, I thought the month went pretty well. Though I had migraines 11 of the 31 days, which is about as good as any month this year, the duration and severity of the migraines was noticeably improved.  It could have been either of the drugs (AIMOVIG OR GRALISE) or the combination of everything I take at night before bedtime, including the Amatriptilin, the Tizanidine, and the Duloxitine.  I attached my updated chart to this email as it seemed it could not be read from the [facility].  I have an issue this week of 4 severe migraines in the last 5 days. A notable difference in my routine was my change of meds to the new sample white containers of the Gralise (I was at 3 of the pills from the sample pack but went back to 2 with the new samples you gave me two weeks ago) and my depletion of my Amitriptilin to 0 as [employer] would not renew my prescription when I needed it.  I am taking 4 per day per Dr. Raliegh Ip, but his prescription reads 3 per day so [employer] said I should still have some, which I obviously do not.  I did not think they made any difference as who knows what has been working and what has not, but the absence of them the past three days and the three extensive migraines seems to indicate that they might be helping me.  I will be allowed to pick them up tomorrow from the [facility], but will run into the same problem again next month.  I presume I should elevate my Gralise to three pills (300MG  each) but will not do so until you approve.  [employer] will be sending me a replacement pack of the AIMOVIG supposedly on Tuesday.  They are totally overwhelmed and have profusely apologized including several calls from their customer  service and management.  Another request for help please:  I am really interested in the weight loss program you recommended with [name] but they STILL have not called me back.  I talked with Vaughan Basta (scheduler) several times and a few weeks ago she said it would be ANY day.  I still have not heard from them.  I really do need their help as I think I am continuing to gain weight because of my back and arthritis limitations to even walking without severe pain.  I am frustrated and ready to start that program which you highly recommended.  Any help you can give me to get into that program will be appreciated.  Thank you for all you have done to help me. I sincerely appreciate your approach to mu issues and have hope/confidence we are closer than any time in my last five years to improving my circumstance.  Thank you for your patience with this long episcle. I hope it helped to give you an accurate update.  Again, the chart of my migraines is attached here.  I hope your week is super!!!  Regards,  [name] [name]

## 2017-06-04 ENCOUNTER — Encounter: Payer: Self-pay | Admitting: Internal Medicine

## 2017-06-04 ENCOUNTER — Other Ambulatory Visit: Payer: Self-pay | Admitting: Internal Medicine

## 2017-06-04 MED ORDER — AMITRIPTYLINE HCL 100 MG PO TABS: 100.0000 mg | ORAL_TABLET | Freq: Every day | ORAL | 3 refills | Status: DC

## 2017-06-04 MED ORDER — AMITRIPTYLINE HCL 25 MG PO TABS: 25.0000 mg | ORAL_TABLET | Freq: Every evening | ORAL | 3 refills | Status: DC | PRN

## 2017-06-05 ENCOUNTER — Encounter (INDEPENDENT_AMBULATORY_CARE_PROVIDER_SITE_OTHER): Payer: Medicare HMO

## 2017-06-06 ENCOUNTER — Encounter: Payer: Self-pay | Admitting: Neurology

## 2017-06-06 ENCOUNTER — Telehealth: Payer: Self-pay

## 2017-06-06 DIAGNOSIS — G4733 Obstructive sleep apnea (adult) (pediatric): Secondary | ICD-10-CM

## 2017-06-06 NOTE — Telephone Encounter (Signed)
Spoke with Dr. Rexene Alberts, she recommends an auto pap for this pt instead of the 06/20/17 appt. I called pt. I advised pt that Dr. Rexene Alberts reviewed their sleep study results and found that pt has osa and given his sleep related complaints and medical history, she recommends treatment in the form of an auto pap. I reviewed PAP compliance expectations with the pt. Pt is agreeable to starting an auto PAP. I advised pt that an order will be sent to a DME, [facility], and [facility] will call the pt within about one week after they file with the pt's insurance. [facility] will show the pt how to use the machine, fit for masks, and troubleshoot the CPAP if needed. A follow up appt was made for insurance purposes with Dr. Rexene Alberts on 08/20/17 at 1:00pm and the 06/20/17 appt was cancelled. Pt verbalized understanding to arrive 15 minutes early and bring their auto PAP. A letter with all of this information in it will be sent to the pt's mychart as a reminder. Pt verbalized understanding of results. Pt had no questions at this time but was encouraged to call back if questions arise.

## 2017-06-06 NOTE — Telephone Encounter (Signed)
[name] [name], would you call and discuss this long email that patient sent to me on my con health acocunt? thanks

## 2017-06-06 NOTE — Telephone Encounter (Signed)
We will set patient up with autoPAP at home, to see if his HAs improve. Pls process order and notify patient and set up FU in 10 weeks with me or NP.

## 2017-06-06 NOTE — Telephone Encounter (Signed)
Called pt. He stated he went to seminar for weight loss center last night because they had a last minute cx. He is scheduled for an appt on 06/18/17 at 8am.   I scheduled him for another appt on 06/21/17 at 1pm with Dr Rexene Alberts to discuss sleep study results/sleep apnea. He missed appt on 05/23/17 (he went out of town to visit family).  He asked again if previous shipment for Aimovig was okay to save and use. He stated it sat out in 90 degree weather and he then put it in the fridge. I advised medication no longer good and must discard this. He verbalized understanding.   I advised he should not use Dr Dorene Grebe email. Needs to send a mychart message/call or drop of information. This does not protect his health information. She does not use this for patients. He verbalized understanding. I recommended he take a picture of whatever he is trying to attach that is too large and attach the picture instead. This should help resolve his issue. He verbalized understanding and appreciation for suggestion.  He has no further questions at this time.

## 2017-06-08 ENCOUNTER — Ambulatory Visit: Payer: Commercial Managed Care - HMO

## 2017-06-11 NOTE — Telephone Encounter (Signed)
Received this notice from [facility]:  "After this referral was reviewed we have hit a snag. His [employer] Medicare insurance follows Traditional Medicare guidelines and will not accept documentation older than 6 months without a valid reason showing a delay in starting therapy.   Unfortunately for this pt he will need to have a new diagnostic sleep study for his [facility] insurance to consider covering pap therapy. The 8/2 office visit he has with Dr. Jaynee Eagles will work for a valid F2F prior to sleep study and be within the time frame as long as new study is performed soon.   The only other option the pt would have at this point without a new sleep study would be to private pay for the pap therapy and supplies but this can be very costly."  I asked [facility] to reach out to the pt and explain this. If pt wants to do another sleep study, I asked [facility] to let us know.

## 2017-06-12 ENCOUNTER — Ambulatory Visit (INDEPENDENT_AMBULATORY_CARE_PROVIDER_SITE_OTHER): Payer: Medicare HMO | Admitting: *Deleted

## 2017-06-12 DIAGNOSIS — Z23 Encounter for immunization: Secondary | ICD-10-CM

## 2017-06-12 NOTE — Telephone Encounter (Signed)
Please advise patient, that he can consider an oral appliance for OSA treatment, which is an approved treatment option (through a [employer], [employer] as a foreign language). He can talk to his own dentist first if he is interested in pursuing this; if needed, we can make a referral to Dr. Ron Parker or Dr. Toy Cookey.  Please let me know.  He can discuss also with PCP and/or Dr. Jaynee Eagles first.   [name], MD, PhD [facility])

## 2017-06-12 NOTE — Telephone Encounter (Signed)
Received this notice from [facility]:  "Vickie Epley,  Our pap specialist called this pt and explained insurance guidelines. He is upset and doesn't want to private pay for anything. Nor does he want to start over with a new sleep study. He said feels his [employer] is terrible.   He ended the call stating to cancel the whole thing.   Sorry but it doesn't sound like he is going to change his mind on this pap therapy.   Let us know if we can do anything else. Thanks."

## 2017-06-12 NOTE — Telephone Encounter (Signed)
Received this notice from [facility]: "Unfortunately since pt never started pap therapy we cannot do a 2 week loaner. We are currently reaching out to the pt to inform him of the situation and offer him private pay option. We will let you know."

## 2017-06-13 NOTE — Telephone Encounter (Signed)
Sent pt a Pharmacist, community message explaining this.

## 2017-06-14 ENCOUNTER — Encounter: Payer: Self-pay | Admitting: Internal Medicine

## 2017-06-16 ENCOUNTER — Encounter (INDEPENDENT_AMBULATORY_CARE_PROVIDER_SITE_OTHER): Payer: Self-pay | Admitting: Family Medicine

## 2017-06-17 ENCOUNTER — Encounter: Payer: Self-pay | Admitting: Neurology

## 2017-06-18 ENCOUNTER — Ambulatory Visit (INDEPENDENT_AMBULATORY_CARE_PROVIDER_SITE_OTHER): Payer: Medicare HMO | Admitting: Family Medicine

## 2017-06-18 ENCOUNTER — Encounter (INDEPENDENT_AMBULATORY_CARE_PROVIDER_SITE_OTHER): Payer: Self-pay | Admitting: Family Medicine

## 2017-06-18 VITALS — BP 113/74 | HR 79 | Temp 98.0°F | Ht 69.0 in | Wt 230.0 lb

## 2017-06-18 DIAGNOSIS — E7849 Other hyperlipidemia: Secondary | ICD-10-CM | POA: Insufficient documentation

## 2017-06-18 DIAGNOSIS — R739 Hyperglycemia, unspecified: Secondary | ICD-10-CM | POA: Diagnosis not present

## 2017-06-18 DIAGNOSIS — Z6834 Body mass index (BMI) 34.0-34.9, adult: Secondary | ICD-10-CM | POA: Diagnosis not present

## 2017-06-18 DIAGNOSIS — E669 Obesity, unspecified: Secondary | ICD-10-CM | POA: Insufficient documentation

## 2017-06-18 DIAGNOSIS — E559 Vitamin D deficiency, unspecified: Secondary | ICD-10-CM | POA: Diagnosis not present

## 2017-06-18 DIAGNOSIS — E784 Other hyperlipidemia: Secondary | ICD-10-CM

## 2017-06-18 DIAGNOSIS — R0602 Shortness of breath: Secondary | ICD-10-CM | POA: Insufficient documentation

## 2017-06-18 DIAGNOSIS — Z9189 Other specified personal risk factors, not elsewhere classified: Secondary | ICD-10-CM | POA: Diagnosis not present

## 2017-06-18 DIAGNOSIS — Z1331 Encounter for screening for depression: Secondary | ICD-10-CM

## 2017-06-18 DIAGNOSIS — Z1389 Encounter for screening for other disorder: Secondary | ICD-10-CM | POA: Diagnosis not present

## 2017-06-18 DIAGNOSIS — R5383 Other fatigue: Secondary | ICD-10-CM | POA: Diagnosis not present

## 2017-06-18 DIAGNOSIS — Z0289 Encounter for other administrative examinations: Secondary | ICD-10-CM

## 2017-06-18 NOTE — Progress Notes (Signed)
Office: [phone]  /  Fax: [phone]   Dear Dr. Jaynee Eagles,   Thank you for referring [name] [name] to our clinic. The following note includes my evaluation and treatment recommendations.  HPI:   Chief Complaint: OBESITY    [name] [name] has been referred by [name] B. Jaynee Eagles, MD for consultation regarding his obesity and obesity related comorbidities.    [name] [name] (MR# [id number]) is a 66 y.o. male who presents on 06/18/2017 for obesity evaluation and treatment. Current BMI is Body mass index is 33.97 kg/m.[name] [name] has been struggling with his weight for many years and has been unsuccessful in either losing weight, maintaining weight loss, or reaching his healthy weight goal.     [name] [name] attended our information session and states he is currently in the action stage of change and ready to dedicate time achieving and maintaining a healthier weight. [name] [name] is interested in becoming our patient and working on intensive lifestyle modifications including (but not limited to) diet, exercise and weight loss.    [name] [name] states his family eats meals together he thinks his family will eat healthier with him his desired weight loss is 55 lbs he started gaining weight after having 4 operations (broke both feet walking 10 miles per day) his heaviest weight ever was 225 lbs. he has significant food cravings issues  he snacks frequently in the evenings he skips meals frequently he frequently eats larger portions than normal  he struggles with emotional eating    Fatigue [name] [name] feels his energy is lower than it should be. This has worsened with weight gain and has not worsened recently. [name] [name] denies daytime somnolence and  admits to waking up still tired. Patient is at risk for obstructive sleep apnea. Patent has a history of symptoms of morning fatigue. Patient generally gets 6 or 7 hours of sleep per night, and states they generally have restful sleep. Snoring is present. Apneic episodes are not  present. Epworth Sleepiness Score is 3  Dyspnea on exertion [name] [name] notes increasing shortness of breath with exercising and seems to be worsening over time with weight gain. He notes getting out of breath sooner with activity than he used to. This has not gotten worse recently. Jedd denies orthopnea.  Vitamin D deficiency [name] [name] has a diagnosis of vitamin D deficiency. He is currently taking OTC vit D @2 ,000 IU daily and denies nausea, vomiting or muscle weakness.  Hyperlipidemia [name] [name] has hyperlipidemia and is currently on a statin. Story has been trying to improve his cholesterol levels with intensive lifestyle modification including a low saturated fat diet, exercise and weight loss. He denies any chest pain, claudication or myalgias but does admit joint pain.  Hyperglycemia [name] [name] eats lots of ice cream most days f the week. He has no diagnosis of diabetes. He admits to polyphagia and recent weight gain (30 lbs this year).  Depression Screen [name] [name]'s Food and Mood (modified PHQ-9) score was  Depression screen PHQ 2/9 06/18/2017  Decreased Interest 3  Down, Depressed, Hopeless 1  PHQ - 2 Score 4  Altered sleeping 0  Tired, decreased energy 3  Change in appetite 1  Feeling bad or failure about yourself  0  Trouble concentrating 0  Moving slowly or fidgety/restless 0  Suicidal thoughts 0  PHQ-9 Score 8    ALLERGIES: Allergies  Allergen Reactions  . Shrimp [Shellfish Allergy] Anaphylaxis and Rash  . Codeine Rash and Other (See Comments)    Blacked out  . Contrast Media [Iodinated Diagnostic Agents]  Rash  . Diazepam Rash    Rash over whole body from neck down, and he lost his voice (airway compromise?)  . Amoxicillin Other (See Comments)    Unknown   . Erythromycin Other (See Comments)    unknown  . Betadine [Povidone Iodine] Rash  . Celebrex [Celecoxib] Rash  . Meperidine And Related Rash  . Methocarbamol Rash  . Oxycodone Rash  . Penicillins Rash  . Plavix [Clopidogrel]  Rash  . Rivaroxaban Rash  . Vioxx [Rofecoxib] Rash    MEDICATIONS: Current Outpatient Prescriptions on File Prior to Visit  Medication Sig Dispense Refill  . allopurinol (ZYLOPRIM) 100 MG tablet TAKE 1 TABLET EVERY DAY 90 tablet 3  . amitriptyline (ELAVIL) 100 MG tablet Take 1 tablet (100 mg total) by mouth at bedtime. 90 tablet 3  . amitriptyline (ELAVIL) 25 MG tablet Take 1 tablet (25 mg total) by mouth at bedtime as needed for sleep. 90 tablet 3  . aspirin 81 MG tablet Take 81 mg by mouth daily.    . Cholecalciferol (VITAMIN D PO) Take 1 tablet by mouth daily.    . DULoxetine (CYMBALTA) 60 MG capsule Take 1 capsule (60 mg total) by mouth daily. 30 capsule 12  . EPINEPHRINE 0.3 mg/0.3 mL IJ SOAJ injection INJECT 0.3 MLS INTO THE MUSCLE ONCE 2 mL 1  . Multiple Vitamin (MULTIVITAMIN) tablet Take 1 tablet by mouth daily.    . Omega-3 Fatty Acids (FISH OIL) 1000 MG CAPS Take 1 capsule by mouth daily.    . pravastatin (PRAVACHOL) 40 MG tablet TAKE 1 TABLET EVERY DAY 90 tablet 3  . pravastatin (PRAVACHOL) 40 MG tablet TAKE 1 TABLET EVERY DAY 90 tablet 3  . Psyllium (METAMUCIL PO) Take 2 tablets by mouth 2 (two) times daily.    . Red Yeast Rice Extract (RED YEAST RICE PO) Take 1 tablet by mouth daily.    . rizatriptan (MAXALT) 10 MG tablet Take 1 tablet (10 mg total) by mouth as needed for migraine. May repeat in 2 hours if needed 30 tablet 4  . SUMAtriptan (IMITREX) 100 MG tablet TAKE 1 TABLET EVERY 2 HOURS AS NEEDED FOR MIGRAINE OR HEADACHE 9 tablet 5  . tiZANidine (ZANAFLEX) 4 MG capsule Take 4 mg by mouth 3 (three) times daily as needed.     . TURMERIC CURCUMIN PO Take 1,050 mg by mouth daily.    . valACYclovir (VALTREX) 500 MG tablet Take 500 mg by mouth daily.    . vitamin C (ASCORBIC ACID) 500 MG tablet Take 500 mg by mouth daily.    Marland Kitchen zolpidem (AMBIEN) 10 MG tablet Take 1 tablet (10 mg total) by mouth at bedtime as needed for sleep. 30 tablet 2   No current facility-administered  medications on file prior to visit.     PAST MEDICAL HISTORY: Past Medical History:  Diagnosis Date  . ALLERGIC RHINITIS 04/22/2007  . Cancer (HCC)    HX SKIN CANCER  . Complication of anesthesia    more concerned about positioning of head & neck because of TMJ  . DDD (degenerative disc disease), cervical   . GERD (gastroesophageal reflux disease)    infrequently  . Headache(784.0)    migraines  . History of kidney stones   . History of skin cancer   . History of TMJ disorder   . HYPERCHOLESTEROLEMIA 04/22/2007  . HYPERLIPIDEMIA 07/29/2007  . Hyperplastic colon polyp   . NEPHROLITHIASIS, HX OF 07/29/2007  . Osteoarth NOS-Unspec 04/22/2007  . Shortness of  breath   . Shoulder pain, acute    bilateral    PAST SURGICAL HISTORY: Past Surgical History:  Procedure Laterality Date  . APPENDECTOMY    . JOINT REPLACEMENT    . KNEE ARTHROSCOPY     right x2  . SHOULDER SURGERY     right  . TOTAL KNEE ARTHROPLASTY Right 08/25/2013   Procedure: RIGHT TOTAL KNEE ARTHROPLASTY;  Surgeon: Gearlean Alf, MD;  Location: [facility];  Service: Orthopedics;  Laterality: Right;  . TOTAL SHOULDER ARTHROPLASTY Left 12/25/2013   DR SUPPLE   . TOTAL SHOULDER ARTHROPLASTY Left 12/25/2013   Procedure: LEFT TOTAL SHOULDER ARTHROPLASTY;  Surgeon: Marin Shutter, MD;  Location: [facility];  Service: Orthopedics;  Laterality: Left;  . TOTAL SHOULDER ARTHROPLASTY Right 06/18/2014   DR SUPPLE  . TOTAL SHOULDER ARTHROPLASTY Right 06/18/2014   Procedure: RIGHT TOTAL SHOULDER ARTHROPLASTY;  Surgeon: Marin Shutter, MD;  Location: [facility];  Service: Orthopedics;  Laterality: Right;    SOCIAL HISTORY: Social History  Substance Use Topics  . Smoking status: Never Smoker  . Smokeless tobacco: Never Used  . Alcohol use No    FAMILY HISTORY: Family History  Problem Relation Age of Onset  . COPD Father        family hx  . Emphysema Father   . Aneurysm Mother   . Stroke Mother   . Sudden death Mother   . Cancer  Mother   . Obesity Mother   . Cancer Maternal Grandfather        lung    ROS: Review of Systems  Constitutional: Positive for malaise/fatigue. Negative for weight loss.  HENT:       Mouth Sores  Respiratory: Positive for cough and shortness of breath (with activity).   Cardiovascular: Negative for chest pain, orthopnea and claudication.  Gastrointestinal: Positive for heartburn. Negative for nausea and vomiting.  Musculoskeletal: Positive for back pain and joint pain. Negative for myalgias.       Negative muscle weakness  Neurological: Positive for weakness and headaches (migraine).  Endo/Heme/Allergies:       Positive Polyphagia    PHYSICAL EXAM: Blood pressure 113/74, pulse 79, temperature 98 F (36.7 C), temperature source Oral, height 5\' 9"  (1.753 m), weight 230 lb (104.3 kg), SpO2 95 %. Body mass index is 33.97 kg/m. Physical Exam  Constitutional: He is oriented to person, place, and time. He appears well-developed and well-nourished.  Cardiovascular: Normal rate.   Pulmonary/Chest: Effort normal.  Musculoskeletal: Normal range of motion.  Neurological: He is oriented to person, place, and time.  Skin: Skin is warm and dry.  Psychiatric: He has a normal mood and affect. His behavior is normal.  Vitals reviewed.   RECENT LABS AND TESTS: BMET    Component Value Date/Time   NA 141 07/31/2014 0920   K 4.7 07/31/2014 0920   CL 105 07/31/2014 0920   CO2 26 07/31/2014 0920   GLUCOSE 94 07/31/2014 0920   GLUCOSE 94 08/20/2006 0830   BUN 23 07/31/2014 0920   CREATININE 1.0 07/31/2014 0920   CALCIUM 9.1 07/31/2014 0920   GFRNONAA 75 (L) 06/10/2014 0955   GFRAA 87 (L) 06/10/2014 0955   No results found for: HGBA1C No results found for: INSULIN CBC    Component Value Date/Time   WBC 5.7 07/31/2014 0920   RBC 4.53 07/31/2014 0920   HGB 14.5 07/31/2014 0920   HCT 43.5 07/31/2014 0920   PLT 313.0 07/31/2014 0920   MCV 96.1 07/31/2014 0920  MCH 31.8 06/10/2014  0955   MCHC 33.4 07/31/2014 0920   RDW 14.8 07/31/2014 0920   LYMPHSABS 2.0 07/31/2014 0920   MONOABS 0.5 07/31/2014 0920   EOSABS 0.2 07/31/2014 0920   BASOSABS 0.1 07/31/2014 0920   Iron/TIBC/Ferritin/ %Sat No results found for: IRON, TIBC, FERRITIN, IRONPCTSAT Lipid Panel     Component Value Date/Time   CHOL 203 (H) 07/31/2014 0920   TRIG 122.0 07/31/2014 0920   TRIG 52 08/20/2006 0830   HDL 56.30 07/31/2014 0920   CHOLHDL 4 07/31/2014 0920   VLDL 24.4 07/31/2014 0920   LDLCALC 122 (H) 07/31/2014 0920   LDLDIRECT 135.0 07/16/2013 0937   Hepatic Function Panel     Component Value Date/Time   PROT 6.7 07/31/2014 0920   ALBUMIN 3.1 (L) 07/31/2014 0920   AST 26 07/31/2014 0920   ALT 22 07/31/2014 0920   ALKPHOS 53 07/31/2014 0920   BILITOT 0.4 07/31/2014 0920   BILIDIR 0.1 07/31/2014 0920      Component Value Date/Time   TSH 1.53 07/31/2014 0920   TSH 1.85 07/16/2013 0937   TSH 1.65 04/29/2012 0759    ECG  shows NSR with a rate of 78 BPM INDIRECT CALORIMETER done today shows a VO2 of 362 and a REE of 2521.  His calculated basal metabolic rate is 7902 thus his basal metabolic rate is better than expected.    ASSESSMENT AND PLAN: Other fatigue - Plan: EKG 12-Lead, CBC With Differential, TSH, T4, free, T3  Shortness of breath on exertion  Other hyperlipidemia - Plan: Lipid Panel With LDL/HDL Ratio  Vitamin D deficiency - Plan: VITAMIN D 25 Hydroxy (Vit-D Deficiency, Fractures)  Hyperglycemia - Plan: Comprehensive metabolic panel, Insulin, random, Hemoglobin A1c  Depression screening  Class 1 obesity with serious comorbidity and body mass index (BMI) of 34.0 to 34.9 in adult, unspecified obesity type  PLAN: Fatigue Yahye was informed that his fatigue may be related to obesity, depression or many other causes. Labs will be ordered, and in the meanwhile Demetries has agreed to work on diet, exercise and weight loss to help with fatigue. Proper sleep hygiene was  discussed including the need for 7-8 hours of quality sleep each night. A sleep study was not ordered based on symptoms and Epworth score.  Dyspnea on exertion Marquiz's shortness of breath appears to be obesity related and exercise induced. He has agreed to work on weight loss and gradually increase exercise to treat his exercise induced shortness of breath. If Nettie follows our instructions and loses weight without improvement of his shortness of breath, we will plan to refer to pulmonology. We will monitor this condition regularly. Montay agrees to this plan.  Vitamin D Deficiency Charvis was informed that low vitamin D levels contributes to fatigue and are associated with obesity, breast, and colon cancer. He agrees to continue OTC vit D @ 2,000 ID daily and we will check labs and will follow up for routine testing of vitamin D, at least 2-3 times per year. He was informed of the risk of over-replacement of vitamin D and agrees to not increase his dose unless he discusses this with [name] first. Kalev agrees to follow up with our clinic in 2 weeks.  Hyperglycemia Fasting labs will be obtained and results with be discussed with [name] [name] in 2 weeks at his follow up visit. In the meanwhile Jeorge was started on a lower simple carbohydrate diet and will work on weight loss efforts.  Hyperlipidemia Raynard was informed of the  American Heart Association Guidelines emphasizing intensive lifestyle modifications as the first line treatment for hyperlipidemia. We discussed many lifestyle modifications today in depth, and Jaystin will continue to work on decreasing saturated fats such as fatty red meat, butter and many fried foods. He will also increase vegetables and lean protein in his diet and continue to work on exercise and weight loss efforts. We will check labs and Diamond agrees to follow up with our clinic in 2 weeks.  Depression Screen Gal had a moderately positive depression screening. Depression is commonly  associated with obesity and often results in emotional eating behaviors. We will monitor this closely and work on CBT to help improve the non-hunger eating patterns. Referral to Psychology may be required if no improvement is seen as he continues in our clinic.  Obesity [place] is currently in the action stage of change and his goal is to continue with weight loss efforts. I recommend Elih begin the structured treatment plan as follows:  He has agreed to follow the Category 3 plan Morad has been instructed to eventually work up to a goal of 150 minutes of combined cardio and strengthening exercise per week for weight loss and overall health benefits. We discussed the following Behavioral Modification Strategies today: increasing lean protein intake, decreasing simple carbohydrates  and work on meal planning and easy cooking plans   He was informed of the importance of frequent follow up visits to maximize his success with intensive lifestyle modifications for his multiple health conditions. He was informed we would discuss his lab results at his next visit unless there is a critical issue that needs to be addressed sooner. Deante agreed to keep his next visit at the agreed upon time to discuss these results.  I, Doreene Nest, am acting as transcriptionist for  Dennard Nip, MD  I have reviewed the above documentation for accuracy and completeness, and I agree with the above. -Dennard Nip, MD   OBESITY BEHAVIORAL INTERVENTION VISIT  Today's visit was # 1 out of 30.  Starting weight: 230 lbs Starting date: 06/18/17 Today's weight : 230 lbs Today's date: 06/18/2017 Total lbs lost to date: 0 (Patients must lose 7 lbs in the first 6 months to continue with counseling)   ASK: We discussed the diagnosis of obesity with [name] [name] today and [name] [name] agreed to give [name] permission to discuss obesity behavioral modification therapy today.  ASSESS: Hamzeh has the diagnosis of obesity and his BMI  today is 33.95 Joshva is in the action stage of change   ADVISE: Myrtle was educated on the multiple health risks of obesity as well as the benefit of weight loss to improve his health. He was advised of the need for long term treatment and the importance of lifestyle modifications.  AGREE: Multiple dietary modification options and treatment options were discussed and  Tasha agreed to follow the Category 3 plan We discussed the following Behavioral Modification Strategies today: increasing lean protein intake, decreasing simple carbohydrates  and work on meal planning and easy cooking plans

## 2017-06-19 LAB — CBC WITH DIFFERENTIAL
BASOS ABS: 0.1 10*3/uL (ref 0.0–0.2)
Basos: 1 %
EOS (ABSOLUTE): 0.3 10*3/uL (ref 0.0–0.4)
Eos: 5 %
HEMOGLOBIN: 15.7 g/dL (ref 13.0–17.7)
Hematocrit: 45 % (ref 37.5–51.0)
IMMATURE GRANULOCYTES: 0 %
Immature Grans (Abs): 0 10*3/uL (ref 0.0–0.1)
LYMPHS ABS: 1.8 10*3/uL (ref 0.7–3.1)
Lymphs: 33 %
MCH: 34.1 pg — ABNORMAL HIGH (ref 26.6–33.0)
MCHC: 34.9 g/dL (ref 31.5–35.7)
MCV: 98 fL — ABNORMAL HIGH (ref 79–97)
MONOCYTES: 10 %
MONOS ABS: 0.6 10*3/uL (ref 0.1–0.9)
NEUTROS PCT: 51 %
Neutrophils Absolute: 2.7 10*3/uL (ref 1.4–7.0)
RBC: 4.6 x10E6/uL (ref 4.14–5.80)
RDW: 14.3 % (ref 12.3–15.4)
WBC: 5.5 10*3/uL (ref 3.4–10.8)

## 2017-06-19 LAB — LIPID PANEL WITH LDL/HDL RATIO
Cholesterol, Total: 229 mg/dL — ABNORMAL HIGH (ref 100–199)
HDL: 65 mg/dL (ref >39)
LDL Calculated: 128 mg/dL — ABNORMAL HIGH (ref 0–99)
LDl/HDL Ratio: 2 ratio (ref 0.0–3.6)
Triglycerides: 179 mg/dL — ABNORMAL HIGH (ref 0–149)
VLDL Cholesterol Cal: 36 mg/dL (ref 5–40)

## 2017-06-19 LAB — T4, FREE: FREE T4: 0.85 ng/dL (ref 0.82–1.77)

## 2017-06-19 LAB — COMPREHENSIVE METABOLIC PANEL
A/G RATIO: 1.6 (ref 1.2–2.2)
ALT: 39 IU/L (ref 0–44)
AST: 35 IU/L (ref 0–40)
Albumin: 4.3 g/dL (ref 3.6–4.8)
Alkaline Phosphatase: 49 IU/L (ref 39–117)
BILIRUBIN TOTAL: 0.3 mg/dL (ref 0.0–1.2)
BUN/Creatinine Ratio: 19 (ref 10–24)
BUN: 21 mg/dL (ref 8–27)
CHLORIDE: 102 mmol/L (ref 96–106)
CO2: 23 mmol/L (ref 20–29)
Calcium: 9 mg/dL (ref 8.6–10.2)
Creatinine, Ser: 1.11 mg/dL (ref 0.76–1.27)
GFR calc non Af Amer: 69 mL/min/{1.73_m2} (ref >59)
GFR, EST AFRICAN AMERICAN: 80 mL/min/{1.73_m2} (ref >59)
GLOBULIN, TOTAL: 2.7 g/dL (ref 1.5–4.5)
Glucose: 96 mg/dL (ref 65–99)
POTASSIUM: 4.6 mmol/L (ref 3.5–5.2)
Sodium: 139 mmol/L (ref 134–144)
Total Protein: 7 g/dL (ref 6.0–8.5)

## 2017-06-19 LAB — HEMOGLOBIN A1C
ESTIMATED AVERAGE GLUCOSE: 120 mg/dL
HEMOGLOBIN A1C: 5.8 % — AB (ref 4.8–5.6)

## 2017-06-19 LAB — T3: T3 TOTAL: 111 ng/dL (ref 71–180)

## 2017-06-19 LAB — TSH: TSH: 2.92 u[IU]/mL (ref 0.450–4.500)

## 2017-06-19 LAB — VITAMIN D 25 HYDROXY (VIT D DEFICIENCY, FRACTURES): Vit D, 25-Hydroxy: 71.2 ng/mL (ref 30.0–100.0)

## 2017-06-19 LAB — INSULIN, RANDOM: INSULIN: 16.5 u[IU]/mL (ref 2.6–24.9)

## 2017-06-21 ENCOUNTER — Ambulatory Visit: Payer: Self-pay | Admitting: Neurology

## 2017-06-25 ENCOUNTER — Encounter (INDEPENDENT_AMBULATORY_CARE_PROVIDER_SITE_OTHER): Payer: Self-pay | Admitting: Family Medicine

## 2017-06-25 NOTE — Telephone Encounter (Signed)
Please advise 

## 2017-07-02 ENCOUNTER — Ambulatory Visit (INDEPENDENT_AMBULATORY_CARE_PROVIDER_SITE_OTHER): Payer: Medicare HMO | Admitting: Family Medicine

## 2017-07-02 VITALS — BP 108/74 | HR 105 | Temp 98.4°F | Ht 69.0 in | Wt 217.0 lb

## 2017-07-02 DIAGNOSIS — Z6832 Body mass index (BMI) 32.0-32.9, adult: Secondary | ICD-10-CM | POA: Diagnosis not present

## 2017-07-02 DIAGNOSIS — E559 Vitamin D deficiency, unspecified: Secondary | ICD-10-CM

## 2017-07-02 DIAGNOSIS — R7303 Prediabetes: Secondary | ICD-10-CM

## 2017-07-02 DIAGNOSIS — E669 Obesity, unspecified: Secondary | ICD-10-CM | POA: Diagnosis not present

## 2017-07-03 NOTE — Progress Notes (Signed)
Office: [phone]  /  Fax: [phone]   HPI:   Chief Complaint: OBESITY [name] [name] is here to discuss his progress with his obesity treatment plan. He is on the Category 3 plan and is following his eating plan approximately 95 % of the time. He states he is walking and cycling for 60+ minutes 7 times per week. [name] [name] has done well with weight loss on the category 3 plan. He followed it closely and kept a journal. [name] [name] did eat more lower sugar ice cream bars than recommended though. His weight is 217 lb (98.4 kg) today and has had a weight loss of 13 pounds over a period of 2 weeks since his last visit. He has lost 13 lbs since starting treatment with [name].  Vitamin D deficiency [name] [name] has a diagnosis of vitamin D deficiency. He is currently taking OTC vit D approximately 1,000 IU daily and level is at goal. [name] [name] denies nausea, vomiting or muscle weakness.  Pre-Diabetes [name] [name] has a diagnosis of pre-diabetes based on his elevated Hgb A1c at 5.8 and elevated fasting insulin level and was informed this puts him at greater risk of developing diabetes. Fasting glucose is normal. He is not taking metformin currently and continues to work on diet and exercise to decrease risk of diabetes. 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 He admits polyphagia and denies nausea or hypoglycemia.  ALLERGIES: Allergies  Allergen Reactions  . Shrimp [Shellfish Allergy] Anaphylaxis and Rash  . Codeine Rash and Other (See Comments)    Blacked out  . Contrast Media [Iodinated Diagnostic Agents] Rash  . Diazepam Rash    Rash over whole body from neck down, and he lost his voice (airway compromise?)  . Amoxicillin Other (See Comments)    Unknown   . Erythromycin Other (See Comments)    unknown  . Betadine [Povidone Iodine] Rash  . Celebrex [Celecoxib] Rash  . Meperidine And Related Rash  . Methocarbamol Rash  . Oxycodone Rash  . Penicillins Rash  . Plavix  [Clopidogrel] Rash  . Rivaroxaban Rash  . Vioxx [Rofecoxib] Rash    MEDICATIONS: Current Outpatient Prescriptions on File Prior to Visit  Medication Sig Dispense Refill  . allopurinol (ZYLOPRIM) 100 MG tablet TAKE 1 TABLET EVERY DAY 90 tablet 3  . amitriptyline (ELAVIL) 100 MG tablet Take 1 tablet (100 mg total) by mouth at bedtime. 90 tablet 3  . amitriptyline (ELAVIL) 25 MG tablet Take 1 tablet (25 mg total) by mouth at bedtime as needed for sleep. 90 tablet 3  . aspirin 81 MG tablet Take 81 mg by mouth daily.    . Cholecalciferol (VITAMIN D PO) Take 1 tablet by mouth daily.    . DULoxetine (CYMBALTA) 60 MG capsule Take 1 capsule (60 mg total) by mouth daily. 30 capsule 12  . EPINEPHRINE 0.3 mg/0.3 mL IJ SOAJ injection INJECT 0.3 MLS INTO THE MUSCLE ONCE 2 mL 1  . Multiple Vitamin (MULTIVITAMIN) tablet Take 1 tablet by mouth daily.    . Omega-3 Fatty Acids (FISH OIL) 1000 MG CAPS Take 1 capsule by mouth daily.    . pravastatin (PRAVACHOL) 40 MG tablet TAKE 1 TABLET EVERY DAY 90 tablet 3  . pravastatin (PRAVACHOL) 40 MG tablet TAKE 1 TABLET EVERY DAY 90 tablet 3  . Psyllium (METAMUCIL PO) Take 2 tablets by mouth 2 (two) times daily.    . Red Yeast Rice Extract (RED YEAST RICE PO) Take 1  tablet by mouth daily.    . rizatriptan (MAXALT) 10 MG tablet Take 1 tablet (10 mg total) by mouth as needed for migraine. May repeat in 2 hours if needed 30 tablet 4  . SUMAtriptan (IMITREX) 100 MG tablet TAKE 1 TABLET EVERY 2 HOURS AS NEEDED FOR MIGRAINE OR HEADACHE 9 tablet 5  . tiZANidine (ZANAFLEX) 4 MG capsule Take 4 mg by mouth 3 (three) times daily as needed.     . TURMERIC CURCUMIN PO Take 1,050 mg by mouth daily.    . valACYclovir (VALTREX) 500 MG tablet Take 500 mg by mouth daily.    . vitamin C (ASCORBIC ACID) 500 MG tablet Take 500 mg by mouth daily.    Marland Kitchen zolpidem (AMBIEN) 10 MG tablet Take 1 tablet (10 mg total) by mouth at bedtime as needed for sleep. 30 tablet 2   No current  facility-administered medications on file prior to visit.     PAST MEDICAL HISTORY: Past Medical History:  Diagnosis Date  . ALLERGIC RHINITIS 04/22/2007  . Cancer (HCC)    HX SKIN CANCER  . Complication of anesthesia    more concerned about positioning of head & neck because of TMJ  . DDD (degenerative disc disease), cervical   . GERD (gastroesophageal reflux disease)    infrequently  . Headache(784.0)    migraines  . History of kidney stones   . History of skin cancer   . History of TMJ disorder   . HYPERCHOLESTEROLEMIA 04/22/2007  . HYPERLIPIDEMIA 07/29/2007  . Hyperplastic colon polyp   . NEPHROLITHIASIS, HX OF 07/29/2007  . Osteoarth NOS-Unspec 04/22/2007  . Shortness of breath   . Shoulder pain, acute    bilateral    PAST SURGICAL HISTORY: Past Surgical History:  Procedure Laterality Date  . APPENDECTOMY    . JOINT REPLACEMENT    . KNEE ARTHROSCOPY     right x2  . SHOULDER SURGERY     right  . TOTAL KNEE ARTHROPLASTY Right 08/25/2013   Procedure: RIGHT TOTAL KNEE ARTHROPLASTY;  Surgeon: Gearlean Alf, MD;  Location: [facility];  Service: Orthopedics;  Laterality: Right;  . TOTAL SHOULDER ARTHROPLASTY Left 12/25/2013   DR SUPPLE   . TOTAL SHOULDER ARTHROPLASTY Left 12/25/2013   Procedure: LEFT TOTAL SHOULDER ARTHROPLASTY;  Surgeon: Marin Shutter, MD;  Location: [place];  Service: Orthopedics;  Laterality: Left;  . TOTAL SHOULDER ARTHROPLASTY Right 06/18/2014   DR SUPPLE  . TOTAL SHOULDER ARTHROPLASTY Right 06/18/2014   Procedure: RIGHT TOTAL SHOULDER ARTHROPLASTY;  Surgeon: Marin Shutter, MD;  Location: [place];  Service: Orthopedics;  Laterality: Right;    SOCIAL HISTORY: Social History  Substance Use Topics  . Smoking status: Never Smoker  . Smokeless tobacco: Never Used  . Alcohol use No    FAMILY HISTORY: Family History  Problem Relation Age of Onset  . COPD Father        family hx  . Emphysema Father   . Aneurysm Mother   . Stroke Mother   . Sudden death  Mother   . Cancer Mother   . Obesity Mother   . Cancer Maternal Grandfather        lung    ROS: Review of Systems  Constitutional: Positive for weight loss.  Gastrointestinal: Negative for nausea and vomiting.  Musculoskeletal:       Negative muscle weakness  Endo/Heme/Allergies:       Positive polyphagia Negative hypoglycemia    PHYSICAL EXAM: Blood pressure 108/74, pulse Marland Kitchen)  105, temperature 98.4 F (36.9 C), temperature source Oral, height 5\' 9"  (1.753 m), weight 217 lb (98.4 kg), SpO2 99 %. Body mass index is 32.05 kg/m. Physical Exam  Constitutional: He is oriented to person, place, and time. He appears well-developed and well-nourished.  Cardiovascular: Normal rate.   Pulmonary/Chest: Effort normal.  Musculoskeletal: Normal range of motion.  Neurological: He is oriented to person, place, and time.  Skin: Skin is warm and dry.  Psychiatric: He has a normal mood and affect. His behavior is normal.  Vitals reviewed.   RECENT LABS AND TESTS: BMET    Component Value Date/Time   NA 139 06/18/2017 1047   K 4.6 06/18/2017 1047   CL 102 06/18/2017 1047   CO2 23 06/18/2017 1047   GLUCOSE 96 06/18/2017 1047   GLUCOSE 94 07/31/2014 0920   GLUCOSE 94 08/20/2006 0830   BUN 21 06/18/2017 1047   CREATININE 1.11 06/18/2017 1047   CALCIUM 9.0 06/18/2017 1047   GFRNONAA 69 06/18/2017 1047   GFRAA 80 06/18/2017 1047   Lab Results  Component Value Date   HGBA1C 5.8 (H) 06/18/2017   Lab Results  Component Value Date   INSULIN 16.5 06/18/2017   CBC    Component Value Date/Time   WBC 5.5 06/18/2017 1047   WBC 5.7 07/31/2014 0920   RBC 4.60 06/18/2017 1047   RBC 4.53 07/31/2014 0920   HGB 15.7 06/18/2017 1047   HCT 45.0 06/18/2017 1047   PLT 313.0 07/31/2014 0920   MCV 98 (H) 06/18/2017 1047   MCH 34.1 (H) 06/18/2017 1047   MCH 31.8 06/10/2014 0955   MCHC 34.9 06/18/2017 1047   MCHC 33.4 07/31/2014 0920   RDW 14.3 06/18/2017 1047   LYMPHSABS 1.8 06/18/2017 1047    MONOABS 0.5 07/31/2014 0920   EOSABS 0.3 06/18/2017 1047   BASOSABS 0.1 06/18/2017 1047   Iron/TIBC/Ferritin/ %Sat No results found for: IRON, TIBC, FERRITIN, IRONPCTSAT Lipid Panel     Component Value Date/Time   CHOL 229 (H) 06/18/2017 1047   TRIG 179 (H) 06/18/2017 1047   TRIG 52 08/20/2006 0830   HDL 65 06/18/2017 1047   CHOLHDL 4 07/31/2014 0920   VLDL 24.4 07/31/2014 0920   LDLCALC 128 (H) 06/18/2017 1047   LDLDIRECT 135.0 07/16/2013 0937   Hepatic Function Panel     Component Value Date/Time   PROT 7.0 06/18/2017 1047   ALBUMIN 4.3 06/18/2017 1047   AST 35 06/18/2017 1047   ALT 39 06/18/2017 1047   ALKPHOS 49 06/18/2017 1047   BILITOT 0.3 06/18/2017 1047   BILIDIR 0.1 07/31/2014 0920      Component Value Date/Time   TSH 2.920 06/18/2017 1047   TSH 1.53 07/31/2014 0920   TSH 1.85 07/16/2013 0937    ASSESSMENT AND PLAN: Vitamin D deficiency  Prediabetes  Class 1 obesity with serious comorbidity and body mass index (BMI) of 32.0 to 32.9 in adult, unspecified obesity type  PLAN:  Vitamin D Deficiency [name] [name] was informed that low vitamin D levels contributes to fatigue and are associated with obesity, breast, and colon cancer. He agrees to continue to take OTC vitamin D 1,000 ID daily. We will recheck labs in 3 months and will follow up for routine testing of vitamin D, at least 2-3 times per year. He was informed of the risk of over-replacement of vitamin D and agrees to not increase his dose unless he discusses this with [name] first. [name] [name] agrees to follow up with our clinic in 4 weeks.  Pre-Diabetes [name] [name] will continue to work on weight loss, exercise, and decreasing simple carbohydrates in his diet to help decrease the risk of diabetes. He was informed that eating too many simple carbohydrates or too many calories at one sitting increases the likelihood of GI side effects. We will recheck labs in 3 months and Marquies agreed to follow up with [name] as directed to  monitor his progress.  We spent > than 50% of the 30 minute visit on the counseling as documented in the note.  Obesity [name] [name] is currently in the action stage of change. As such, his goal is to continue with weight loss efforts He has agreed to follow the Category 3 plan [name] [name] has been instructed to work up to a goal of 150 minutes of combined cardio and strengthening exercise per week for weight loss and overall health benefits. We discussed the following Behavioral Modification Strategies today: better snacking choices, increasing lean protein intake, decreasing simple carbohydrates and decrease junk food  [name] [name] has agreed to follow up with our clinic in 4 weeks and with our registered dietician in 2 weeks. He was informed of the importance of frequent follow up visits to maximize his success with intensive lifestyle modifications for his multiple health conditions.  I, [name] [name], am acting as transcriptionist for Dennard Nip, MD  I have reviewed the above documentation for accuracy and completeness, and I agree with the above. -Dennard Nip, MD   OBESITY BEHAVIORAL INTERVENTION VISIT  Today's visit was # 2 out of 106.  Starting weight: 230 lbs Starting date: 06/18/17 Today's weight : 217 lbs Today's date: 07/02/2017 Total lbs lost to date: 13 (Patients must lose 7 lbs in the first 6 months to continue with counseling)   ASK: We discussed the diagnosis of obesity with [name] [name] today and [name] [name] agreed to give [name] permission to discuss obesity behavioral modification therapy today.  ASSESS: [name] [name] has the diagnosis of obesity and his BMI today is 32.03 [name] [name] is in the action stage of change   ADVISE: [name] [name] was educated on the multiple health risks of obesity as well as the benefit of weight loss to improve his health. He was advised of the need for long term treatment and the importance of lifestyle modifications.  AGREE: Multiple dietary modification options and  treatment options were discussed and  [name] [name] agreed to follow the Category 3 plan We discussed the following Behavioral Modification Strategies today: better snacking choices, increasing lean protein intake, decreasing simple carbohydrates and decrease junk food

## 2017-07-10 ENCOUNTER — Telehealth: Payer: Self-pay | Admitting: *Deleted

## 2017-07-10 MED ORDER — ERENUMAB-AOOE 70 MG/ML ~~LOC~~ SOAJ: 70.0000 mg | SUBCUTANEOUS | 11 refills | Status: DC

## 2017-07-10 MED ORDER — ERENUMAB-AOOE 70 MG/ML ~~LOC~~ SOAJ
70.0000 mg | SUBCUTANEOUS | 11 refills | Status: DC
Start: 2017-07-10 — End: 2017-11-02

## 2017-07-10 NOTE — Telephone Encounter (Signed)
LMVM for pt that did send a prescription for aimovig to his local pharmacy as per Dr. Jaynee Eagles and Olegario Shearer with [employer] request.  Most likely to get a denial and need a pa.  Wanted to let him know if he got letters in mail or telephone calls from his pharmacy.

## 2017-07-12 ENCOUNTER — Ambulatory Visit (INDEPENDENT_AMBULATORY_CARE_PROVIDER_SITE_OTHER): Payer: Medicare HMO

## 2017-07-12 VITALS — BP 116/70 | HR 82 | Ht 69.0 in | Wt 221.0 lb

## 2017-07-12 DIAGNOSIS — Z23 Encounter for immunization: Secondary | ICD-10-CM

## 2017-07-12 DIAGNOSIS — Z0000 Encounter for general adult medical examination without abnormal findings: Secondary | ICD-10-CM | POA: Diagnosis not present

## 2017-07-12 DIAGNOSIS — Z1159 Encounter for screening for other viral diseases: Secondary | ICD-10-CM

## 2017-07-12 NOTE — Patient Instructions (Addendum)
[name] [name] , Thank you for taking time to come for your Medicare Wellness Visit. I appreciate your ongoing commitment to your health goals. Please review the following plan we discussed and let me know if I can assist you in the future.  Shingrix is a vaccine for the prevention of Shingles in Adults 50 and older.  If you are on Medicare, you can request a prescription from your doctor to be filled at a pharmacy.  Please check with your benefits regarding applicable copays or out of pocket expenses.  The Shingrix is given in 2 vaccines approx 8 weeks apart. You must receive the 2nd dose prior to 6 months from receipt of the first.   Medicare now request all "baby boomers" test for possible exposure to Hepatitis C. Many may have been exposed due to dental work, tatoo's, vaccinations when young. The Hepatitis C virus is dormant for many years and then sometimes will cause liver cancer. If you gave blood in the past 15 years, you were most likely checked for Hep C. If you rec'd blood; you may want to consider testing or if you are high risk for any other reason.    Falls can be the main reason people lose their independence Think before you CLIMB    Prevention of falls: Remove rugs or any tripping hazards in the home Use Non slip mats in bathtubs and showers Placing grab bars next to the toilet and or shower Placing handrails on both sides of the stair way Adding extra lighting in the home.   Personal safety issues reviewed:  1. Consider starting a community watch program per [facility] 2.  Changes batteries is smoke detector and/or carbon monoxide detector  3.  If you have firearms; keep them in a safe place 4.  Wear protection when in the sun; Always wear sunscreen or a hat; It is good to have your doctor check your skin annually or review any new areas of concern 5. Driving safety; Keep in the right lane; stay 3 car lengths behind the car in front of you on the highway;  look 3 times prior to pulling out; carry your cell phone everywhere you go!    Learn about the Yellow Dot program:  The program allows first responders at your emergency to have access to who your physician is, as well as your medications and medical conditions.  Citizens requesting the Yellow Dot Packages should contact Master Corporal Nunzio Cobbs at the [facility] [phone] for the first week of the program and beginning the week after Easter citizens should contact their Scientist, physiological.    These are the goals we discussed: Goals    . Weight (lb) < 169 lb (76.7 kg)          Will continue your weight loss efforts with [name]  Continue your exercise program        This is a list of the screening recommended for you and due dates:  Health Maintenance  Topic Date Due  .  Hepatitis C: One time screening is recommended by Center for Disease Control  (CDC) for  adults born from [birth year] through 1965.   1951/08/29  . Colon Cancer Screening  09/27/2016  . Pneumonia vaccines (2 of 2 - PPSV23) 06/02/2017  . Tetanus Vaccine  03/26/2021  . Flu Shot  Completed        Colonoscopy, Adult A colonoscopy is an exam to look at the entire large intestine.  During the exam, a lubricated, bendable tube is inserted into the anus and then passed into the rectum, colon, and other parts of the large intestine. A colonoscopy is often done as a part of normal colorectal screening or in response to certain symptoms, such as anemia, persistent diarrhea, abdominal pain, and blood in the stool. The exam can help screen for and diagnose medical problems, including:  Tumors.  Polyps.  Inflammation.  Areas of bleeding.  Tell a health care provider about:  Any allergies you have.  All medicines you are taking, including vitamins, herbs, eye drops, creams, and over-the-counter medicines.  Any problems you or family members have had with anesthetic  medicines.  Any blood disorders you have.  Any surgeries you have had.  Any medical conditions you have.  Any problems you have had passing stool. What are the risks? Generally, this is a safe procedure. However, problems may occur, including:  Bleeding.  A tear in the intestine.  A reaction to medicines given during the exam.  Infection (rare).  What happens before the procedure? Eating and drinking restrictions Follow instructions from your health care provider about eating and drinking, which may include:  A few days before the procedure - follow a low-fiber diet. Avoid nuts, seeds, dried fruit, raw fruits, and vegetables.  1-3 days before the procedure - follow a clear liquid diet. Drink only clear liquids, such as clear broth or bouillon, black coffee or tea, clear juice, clear soft drinks or sports drinks, gelatin dessert, and popsicles. Avoid any liquids that contain red or purple dye.  On the day of the procedure - do not eat or drink anything during the 2 hours before the procedure, or within the time period that your health care provider recommends.  Bowel prep If you were prescribed an oral bowel prep to clean out your colon:  Take it as told by your health care provider. Starting the day before your procedure, you will need to drink a large amount of medicated liquid. The liquid will cause you to have multiple loose stools until your stool is almost clear or light green.  If your skin or anus gets irritated from diarrhea, you may use these to relieve the irritation: ? Medicated wipes, such as adult wet wipes with aloe and vitamin E. ? A skin soothing-product like petroleum jelly.  If you vomit while drinking the bowel prep, take a break for up to 60 minutes and then begin the bowel prep again. If vomiting continues and you cannot take the bowel prep without vomiting, call your health care provider.  General instructions  Ask your health care provider about  changing or stopping your regular medicines. This is especially important if you are taking diabetes medicines or blood thinners.  Plan to have someone take you home from the hospital or clinic. What happens during the procedure?  An IV tube may be inserted into one of your veins.  You will be given medicine to help you relax (sedative).  To reduce your risk of infection: ? Your health care team will wash or sanitize their hands. ? Your anal area will be washed with soap.  You will be asked to lie on your side with your knees bent.  Your health care provider will lubricate a long, thin, flexible tube. The tube will have a camera and a light on the end.  The tube will be inserted into your anus.  The tube will be gently eased through your rectum and colon.  Air will be delivered into your colon to keep it open. You may feel some pressure or cramping.  The camera will be used to take images during the procedure.  A small tissue sample may be removed from your body to be examined under a microscope (biopsy). If any potential problems are found, the tissue will be sent to a lab for testing.  If small polyps are found, your health care provider may remove them and have them checked for cancer cells.  The tube that was inserted into your anus will be slowly removed. The procedure may vary among health care providers and hospitals. What happens after the procedure?  Your blood pressure, heart rate, breathing rate, and blood oxygen level will be monitored until the medicines you were given have worn off.  Do not drive for 24 hours after the exam.  You may have a small amount of blood in your stool.  You may pass gas and have mild abdominal cramping or bloating due to the air that was used to inflate your colon during the exam.  It is up to you to get the results of your procedure. Ask your health care provider, or the department performing the procedure, when your results will be  ready. This information is not intended to replace advice given to you by your health care provider. Make sure you discuss any questions you have with your health care provider. Document Released: 09/08/2000 Document Revised: 07/12/2016 Document Reviewed: 11/23/2015 Elsevier Interactive Patient Education  2018 [place] Prevention in the [place] can cause injuries. They can happen to people of all ages. There are many things you can do to make your home safe and to help prevent falls. What can I do on the outside of my home?  Regularly fix the edges of walkways and driveways and fix any cracks.  Remove anything that might make you trip as you walk through a door, such as a raised step or threshold.  Trim any bushes or trees on the path to your home.  Use bright outdoor lighting.  Clear any walking paths of anything that might make someone trip, such as rocks or tools.  Regularly check to see if handrails are loose or broken. Make sure that both sides of any steps have handrails.  Any raised decks and porches should have guardrails on the edges.  Have any leaves, snow, or ice cleared regularly.  Use sand or salt on walking paths during winter.  Clean up any spills in your garage right away. This includes oil or grease spills. What can I do in the bathroom?  Use night lights.  Install grab bars by the toilet and in the tub and shower. Do not use towel bars as grab bars.  Use non-skid mats or decals in the tub or shower.  If you need to sit down in the shower, use a plastic, non-slip stool.  Keep the floor dry. Clean up any water that spills on the floor as soon as it happens.  Remove soap buildup in the tub or shower regularly.  Attach bath mats securely with double-sided non-slip rug tape.  Do not have throw rugs and other things on the floor that can make you trip. What can I do in the bedroom?  Use night lights.  Make sure that you have a light by  your bed that is easy to reach.  Do not use any sheets or blankets that are too big for your bed.  They should not hang down onto the floor.  Have a firm chair that has side arms. You can use this for support while you get dressed.  Do not have throw rugs and other things on the floor that can make you trip. What can I do in the kitchen?  Clean up any spills right away.  Avoid walking on wet floors.  Keep items that you use a lot in easy-to-reach places.  If you need to reach something above you, use a strong step stool that has a grab bar.  Keep electrical cords out of the way.  Do not use floor polish or wax that makes floors slippery. If you must use wax, use non-skid floor wax.  Do not have throw rugs and other things on the floor that can make you trip. What can I do with my stairs?  Do not leave any items on the stairs.  Make sure that there are handrails on both sides of the stairs and use them. Fix handrails that are broken or loose. Make sure that handrails are as long as the stairways.  Check any carpeting to make sure that it is firmly attached to the stairs. Fix any carpet that is loose or worn.  Avoid having throw rugs at the top or bottom of the stairs. If you do have throw rugs, attach them to the floor with carpet tape.  Make sure that you have a light switch at the top of the stairs and the bottom of the stairs. If you do not have them, ask someone to add them for you. What else can I do to help prevent falls?  Wear shoes that: ? Do not have high heels. ? Have rubber bottoms. ? Are comfortable and fit you well. ? Are closed at the toe. Do not wear sandals.  If you use a stepladder: ? Make sure that it is fully opened. Do not climb a closed stepladder. ? Make sure that both sides of the stepladder are locked into place. ? Ask someone to hold it for you, if possible.  Clearly mark and make sure that you can see: ? Any grab bars or handrails. ? First and  last steps. ? Where the edge of each step is.  Use tools that help you move around (mobility aids) if they are needed. These include: ? Canes. ? Walkers. ? Scooters. ? Crutches.  Turn on the lights when you go into a dark area. Replace any light bulbs as soon as they burn out.  Set up your furniture so you have a clear path. Avoid moving your furniture around.  If any of your floors are uneven, fix them.  If there are any pets around you, be aware of where they are.  Review your medicines with your doctor. Some medicines can make you feel dizzy. This can increase your chance of falling. Ask your doctor what other things that you can do to help prevent falls. This information is not intended to replace advice given to you by your health care provider. Make sure you discuss any questions you have with your health care provider. Document Released: 07/08/2009 Document Revised: 02/17/2016 Document Reviewed: 10/16/2014 Elsevier Interactive Patient Education  2018 [place] Maintenance, Male A healthy lifestyle and preventive care is important for your health and wellness. Ask your health care provider about what schedule of regular examinations is right for you. What should I know about weight and diet? Eat a Healthy Diet  Eat  plenty of vegetables, fruits, whole grains, low-fat dairy products, and lean protein.  Do not eat a lot of foods high in solid fats, added sugars, or salt.  Maintain a Healthy Weight Regular exercise can help you achieve or maintain a healthy weight. You should:  Do at least 150 minutes of exercise each week. The exercise should increase your heart rate and make you sweat (moderate-intensity exercise).  Do strength-training exercises at least twice a week.  Watch Your Levels of Cholesterol and Blood Lipids  Have your blood tested for lipids and cholesterol every 5 years starting at 66 years of age. If you are at high risk for heart disease, you  should start having your blood tested when you are 66 years old. You may need to have your cholesterol levels checked more often if: ? Your lipid or cholesterol levels are high. ? You are older than 66 years of age. ? You are at high risk for heart disease.  What should I know about cancer screening? Many types of cancers can be detected early and may often be prevented. Lung Cancer  You should be screened every year for lung cancer if: ? You are a current smoker who has smoked for at least 30 years. ? You are a former smoker who has quit within the past 15 years.  Talk to your health care provider about your screening options, when you should start screening, and how often you should be screened.  Colorectal Cancer  Routine colorectal cancer screening usually begins at 66 years of age and should be repeated every 5-10 years until you are 66 years old. You may need to be screened more often if early forms of precancerous polyps or small growths are found. Your health care provider may recommend screening at an earlier age if you have risk factors for colon cancer.  Your health care provider may recommend using home test kits to check for hidden blood in the stool.  A small camera at the end of a tube can be used to examine your colon (sigmoidoscopy or colonoscopy). This checks for the earliest forms of colorectal cancer.  Prostate and Testicular Cancer  Depending on your age and overall health, your health care provider may do certain tests to screen for prostate and testicular cancer.  Talk to your health care provider about any symptoms or concerns you have about testicular or prostate cancer.  Skin Cancer  Check your skin from head to toe regularly.  Tell your health care provider about any new moles or changes in moles, especially if: ? There is a change in a mole's size, shape, or color. ? You have a mole that is larger than a pencil eraser.  Always use sunscreen. Apply  sunscreen liberally and repeat throughout the day.  Protect yourself by wearing long sleeves, pants, a wide-brimmed hat, and sunglasses when outside.  What should I know about heart disease, diabetes, and high blood pressure?  If you are 69-36 years of age, have your blood pressure checked every 3-5 years. If you are 58 years of age or older, have your blood pressure checked every year. You should have your blood pressure measured twice-once when you are at a hospital or clinic, and once when you are not at a hospital or clinic. Record the average of the two measurements. To check your blood pressure when you are not at a hospital or clinic, you can use: ? An automated blood pressure machine at a pharmacy. ? A home  blood pressure monitor.  Talk to your health care provider about your target blood pressure.  If you are between 46-61 years old, ask your health care provider if you should take aspirin to prevent heart disease.  Have regular diabetes screenings by checking your fasting blood sugar level. ? If you are at a normal weight and have a low risk for diabetes, have this test once every three years after the age of 37. ? If you are overweight and have a high risk for diabetes, consider being tested at a younger age or more often.  A one-time screening for abdominal aortic aneurysm (AAA) by ultrasound is recommended for men aged 49-75 years who are current or former smokers. What should I know about preventing infection? Hepatitis B If you have a higher risk for hepatitis B, you should be screened for this virus. Talk with your health care provider to find out if you are at risk for hepatitis B infection. Hepatitis C Blood testing is recommended for:  Everyone born from [birth year] through 1965.  Anyone with known risk factors for hepatitis C.  Sexually Transmitted Diseases (STDs)  You should be screened each year for STDs including gonorrhea and chlamydia if: ? You are sexually active  and are younger than 66 years of age. ? You are older than 66 years of age and your health care provider tells you that you are at risk for this type of infection. ? Your sexual activity has changed since you were last screened and you are at an increased risk for chlamydia or gonorrhea. Ask your health care provider if you are at risk.  Talk with your health care provider about whether you are at high risk of being infected with HIV. Your health care provider may recommend a prescription medicine to help prevent HIV infection.  What else can I do?  Schedule regular health, dental, and eye exams.  Stay current with your vaccines (immunizations).  Do not use any tobacco products, such as cigarettes, chewing tobacco, and e-cigarettes. If you need help quitting, ask your health care provider.  Limit alcohol intake to no more than 2 drinks per day. One drink equals 12 ounces of beer, 5 ounces of wine, or 1 ounces of hard liquor.  Do not use street drugs.  Do not share needles.  Ask your health care provider for help if you need support or information about quitting drugs.  Tell your health care provider if you often feel depressed.  Tell your health care provider if you have ever been abused or do not feel safe at home. This information is not intended to replace advice given to you by your health care provider. Make sure you discuss any questions you have with your health care provider. Document Released: 03/09/2008 Document Revised: 05/10/2016 Document Reviewed: 06/15/2015 Elsevier Interactive Patient Education  2018 [place]

## 2017-07-12 NOTE — Progress Notes (Addendum)
Subjective:   [name] [name] is a 66 y.o. male who presents for an Initial Medicare Annual Wellness Visit.  The Patient was informed that the wellness visit is to identify future health risk and educate and initiate measures that can reduce risk for increased disease through the lifespan.    Annual Wellness Assessment  Reports health as ok After multiple surgeries    Preventive Screening -Counseling & Management  Medicare Annual Preventive Care Visit - Subsequent Last OV 09/2016  PSA done on 07/2104 Colonoscopy 09/2006 - will plan to have this when he is feeling better; will hold for now but  Plans to get this defer until his health has improved; Plans to complete in the future   Health Maintenance Due  Topic Date Due  . Hepatitis C Screening  12/14/1950  . COLONOSCOPY  09/27/2016  . PNA vac Low Risk Adult (2 of 2 - PPSV23) 06/02/2017   Will take the psv 23 today Agreed to Hep c screen at the next blood draw  VS reviewed;   Diet  Weight is down 9 lbs in 2 weeks Has had a migraine 50% of the time this year Can't treat by going on a disease; body recognizes diet  They want 1 lbs a week so body want notice and over compensate Test metabolism and his was good Am 3 eggs 2 pcs toast; 2 eggs or 50 cal of cheese with toast and 0 calories jelly Low fat yogurt with pce of cheese toast or peanut butter  4oz lunch meat (low sodium) mustard; no mayo or butter w vegetables; red pepper etc 8 to 10 oz of lean meat  2 cups of vegetables;  Bonus; 300 calories - [place] frozen yogurt bars  In [place]  Now rides the bike while he eats     BMI 32; seeing [name]  Weight loss;   Exercise Walked 4 to 5 miles per day  TKR, TSR on the left (good recovery) and right shoulder does not have as much return with rom TKR in 2014x 2 Back fusion -could not walk and now is doing well. Doing well overall but gained weight.     Cardiac Risk Factors Addressed Hyperlipidemia - chol  229; hdl 65; ldl 120 and trig 179  Pre-diabetes a1c was 5.8 Obesity BMI 32   Advanced Directives has completed a living will   Patient Care Team: Marletta Lor, MD as PCP - General [name] for weight control  Dr. Jaynee Eagles neurology    Cardiac Risk Factors include: advanced age (>70men, >74 women);dyslipidemia;male gender;obesity (BMI >30kg/m2)    Objective:    Today's Vitals   07/12/17 1301  BP: 116/70  Pulse: 82  SpO2: 95%  Weight: 221 lb (100.2 kg)  Height: 5\' 9"  (1.753 m)   Body mass index is 32.64 kg/m.  Current Medications (verified) Outpatient Encounter Prescriptions as of 07/12/2017  Medication Sig  . allopurinol (ZYLOPRIM) 100 MG tablet TAKE 1 TABLET EVERY DAY  . amitriptyline (ELAVIL) 100 MG tablet Take 1 tablet (100 mg total) by mouth at bedtime.  Marland Kitchen amitriptyline (ELAVIL) 25 MG tablet Take 1 tablet (25 mg total) by mouth at bedtime as needed for sleep.  Marland Kitchen aspirin 81 MG tablet Take 81 mg by mouth daily.  . Cholecalciferol (VITAMIN D PO) Take 1 tablet by mouth daily.  . DULoxetine (CYMBALTA) 60 MG capsule Take 1 capsule (60 mg total) by mouth daily.  Marland Kitchen EPINEPHRINE 0.3 mg/0.3 mL IJ SOAJ injection INJECT  0.3 MLS INTO THE MUSCLE ONCE  . Erenumab-aooe (AIMOVIG) 70 MG/ML SOAJ Inject 70 mg into the skin every 30 (thirty) days.  . Multiple Vitamin (MULTIVITAMIN) tablet Take 1 tablet by mouth daily.  . Omega-3 Fatty Acids (FISH OIL) 1000 MG CAPS Take 1 capsule by mouth daily.  . pravastatin (PRAVACHOL) 40 MG tablet TAKE 1 TABLET EVERY DAY  . pravastatin (PRAVACHOL) 40 MG tablet TAKE 1 TABLET EVERY DAY  . Psyllium (METAMUCIL PO) Take 2 tablets by mouth 2 (two) times daily.  . Red Yeast Rice Extract (RED YEAST RICE PO) Take 1 tablet by mouth daily.  . rizatriptan (MAXALT) 10 MG tablet Take 1 tablet (10 mg total) by mouth as needed for migraine. May repeat in 2 hours if needed  . SUMAtriptan (IMITREX) 100 MG tablet TAKE 1 TABLET EVERY 2 HOURS AS NEEDED FOR MIGRAINE  OR HEADACHE  . tiZANidine (ZANAFLEX) 4 MG capsule Take 4 mg by mouth 3 (three) times daily as needed.   . TURMERIC CURCUMIN PO Take 1,050 mg by mouth daily.  . valACYclovir (VALTREX) 500 MG tablet Take 500 mg by mouth daily.  . vitamin C (ASCORBIC ACID) 500 MG tablet Take 500 mg by mouth daily.  Marland Kitchen zolpidem (AMBIEN) 10 MG tablet Take 1 tablet (10 mg total) by mouth at bedtime as needed for sleep.   No facility-administered encounter medications on file as of 07/12/2017.     Allergies (verified) Shrimp [shellfish allergy]; Codeine; Contrast media [iodinated diagnostic agents]; Diazepam; Amoxicillin; Erythromycin; Betadine [povidone iodine]; Celebrex [celecoxib]; Meperidine and related; Methocarbamol; Oxycodone; Penicillins; Plavix [clopidogrel]; Rivaroxaban; and Vioxx [rofecoxib]   History: Past Medical History:  Diagnosis Date  . ALLERGIC RHINITIS 04/22/2007  . Cancer (HCC)    HX SKIN CANCER  . Complication of anesthesia    more concerned about positioning of head & neck because of TMJ  . DDD (degenerative disc disease), cervical   . GERD (gastroesophageal reflux disease)    infrequently  . Headache(784.0)    migraines  . History of kidney stones   . History of skin cancer   . History of TMJ disorder   . HYPERCHOLESTEROLEMIA 04/22/2007  . HYPERLIPIDEMIA 07/29/2007  . Hyperplastic colon polyp   . NEPHROLITHIASIS, HX OF 07/29/2007  . Osteoarth NOS-Unspec 04/22/2007  . Shortness of breath   . Shoulder pain, acute    bilateral   Past Surgical History:  Procedure Laterality Date  . APPENDECTOMY    . JOINT REPLACEMENT    . KNEE ARTHROSCOPY     right x2  . SHOULDER SURGERY     right  . TOTAL KNEE ARTHROPLASTY Right 08/25/2013   Procedure: RIGHT TOTAL KNEE ARTHROPLASTY;  Surgeon: Gearlean Alf, MD;  Location: [facility];  Service: Orthopedics;  Laterality: Right;  . TOTAL SHOULDER ARTHROPLASTY Left 12/25/2013   DR SUPPLE   . TOTAL SHOULDER ARTHROPLASTY Left 12/25/2013   Procedure: LEFT  TOTAL SHOULDER ARTHROPLASTY;  Surgeon: Marin Shutter, MD;  Location: [place];  Service: Orthopedics;  Laterality: Left;  . TOTAL SHOULDER ARTHROPLASTY Right 06/18/2014   DR SUPPLE  . TOTAL SHOULDER ARTHROPLASTY Right 06/18/2014   Procedure: RIGHT TOTAL SHOULDER ARTHROPLASTY;  Surgeon: Marin Shutter, MD;  Location: [place];  Service: Orthopedics;  Laterality: Right;   Family History  Problem Relation Age of Onset  . COPD Father        family hx  . Emphysema Father   . Aneurysm Mother   . Stroke Mother   . Sudden death  Mother   . Cancer Mother   . Obesity Mother   . Cancer Maternal Grandfather        lung   Social History   Occupational History  . Retired    Social History Main Topics  . Smoking status: Never Smoker  . Smokeless tobacco: Never Used  . Alcohol use No  . Drug use: No  . Sexual activity: Yes    Partners: Female    Birth control/ protection: Implant   Tobacco Counseling Counseling given: Yes   Activities of Daily Living In your present state of health, do you have any difficulty performing the following activities: 07/12/2017  Hearing? N  Vision? N  Difficulty concentrating or making decisions? N  Walking or climbing stairs? N  Dressing or bathing? N  Doing errands, shopping? N  Preparing Food and eating ? N  Using the Toilet? N  In the past six months, have you accidently leaked urine? N  Do you have problems with loss of bowel control? N  Managing your Medications? N  Managing your Finances? N  Housekeeping or managing your Housekeeping? N  Some recent data might be hidden    Immunizations and Health Maintenance Immunization History  Administered Date(s) Administered  . Influenza Split 07/10/2012  . Influenza Whole 09/25/2005, 07/22/2009  . Influenza, High Dose Seasonal PF 06/30/2016, 06/12/2017  . Influenza,inj,Quad PF,6+ Mos 06/10/2013, 06/10/2014, 06/16/2015  . Influenza-Unspecified 07/10/2012, 06/10/2013, 06/10/2014, 06/16/2015  .  Pneumococcal Conjugate-13 06/02/2016  . Tdap 03/27/2011  . Zoster 06/02/2016    Patient Care Team: Marletta Lor, MD as PCP - General  [name], Ca[place] Dr. Jaynee Eagles, neuro    Indicate any recent Medical Services you may have received from other than [facility] providers in the past year (date may be approximate).    Assessment:   This is a routine wellness examination for [name] [name].   Hearing/Vision screen Hearing Screening Comments: Hearing no issues  Vision Screening Comments: Vision [facility] No eye issues at this time  Wears glasses for reading   Dietary issues and exercise activities discussed: Current Exercise Habits: Structured exercise class;Home exercise routine, Time (Minutes): > 60, Frequency (Times/Week): 7, Weekly Exercise (Minutes/Week): 0, Intensity: Moderate  Goals    . Weight (lb) < 169 lb (76.7 kg)          Will continue your weight loss efforts with [name]  Continue your exercise program       Depression Screen PHQ 2/9 Scores 07/12/2017 06/18/2017 08/12/2014  PHQ - 2 Score 0 4 0  PHQ- 9 Score - 8 -    Fall Risk Fall Risk  07/12/2017  Falls in the past year? No    Cognitive Function:   Ad8 score reviewed for issues:  Issues making decisions:  Less interest in hobbies / activities:  Repeats questions, stories (family complaining):  Trouble using ordinary gadgets (microwave, computer, phone):  Forgets the month or year:   Mismanaging finances:   Remembering appts:  Daily problems with thinking and/or memory: Ad8 score is=0          Screening Tests Health Maintenance  Topic Date Due  . Hepatitis C Screening  November 22, 1950  . COLONOSCOPY  09/27/2016  . PNA vac Low Risk Adult (2 of 2 - PPSV23) 06/02/2017  . TETANUS/TDAP  03/26/2021  . INFLUENZA VACCINE  Completed        Plan:      PCP Notes  Health Maintenance  Educated regarding the shingrix Took the psv  23 today Will have hep c drawn at the next blood  draw  Very excited about diet plan and working with [name]  Weight loss 10+ lbs. In 2 weeks  Excellent exercise program;   Abnormal Screens  none  Referrals  None but agrees to have his colonoscopy when he feels his health is better and weight is down. Deferred for now   Patient concerns; Continue to lose weight   Nurse Concerns; As noted   Next PCP apt 09/06/2017    I have personally reviewed and noted the following in the patient's chart:   . Medical and social history . Use of alcohol, tobacco or illicit drugs  . Current medications and supplements . Functional ability and status . Nutritional status . Physical activity . Advanced directives . List of other physicians . Hospitalizations, surgeries, and ER visits in previous 12 months . Vitals . Screenings to include cognitive, depression, and falls . Referrals and appointments  In addition, I have reviewed and discussed with patient certain preventive protocols, quality metrics, and best practice recommendations. A written personalized care plan for preventive services as well as general preventive health recommendations were provided to patient.     OMVEH,MCNOB, RN   07/12/2017   Results of subsequent Medicare wellness visit, reviewed and agree with findings.  Annual clinical exam as scheduled.  Nyoka Cowden

## 2017-07-16 ENCOUNTER — Ambulatory Visit (INDEPENDENT_AMBULATORY_CARE_PROVIDER_SITE_OTHER): Payer: Medicare HMO | Admitting: Dietician

## 2017-07-16 VITALS — Ht 69.0 in | Wt 217.0 lb

## 2017-07-16 DIAGNOSIS — E669 Obesity, unspecified: Secondary | ICD-10-CM | POA: Diagnosis not present

## 2017-07-16 DIAGNOSIS — R7303 Prediabetes: Secondary | ICD-10-CM

## 2017-07-16 DIAGNOSIS — Z6832 Body mass index (BMI) 32.0-32.9, adult: Secondary | ICD-10-CM | POA: Diagnosis not present

## 2017-07-16 NOTE — Progress Notes (Signed)
  Office: [phone]  /  Fax: [phone]     Jaramiah has a diagnosis of prediabetes based on his elevated HgA1c and was informed this puts him at greater risk of developing diabetes. He is here today for diabetes prevention nutrition counseling. Patient was educated about food nutrients ie protein, fats, simple and complex carbohydrates and how these affect insulin response. Focus on portion control,  avoiding simple carbohydrates and lower fat foods for ongoing wt loss efforts and glucose management. He continues to struggle with evening snacking. He is following our Category 3 meal plan plus 300 calories. He exercising daily 60+ minutes walking or cycling.   His meal plan was individualized for maximum benefit. Journaling his dinner and evening food intake was discussed in detail. He was given a goal of 550-800 calories and 50 grams of protein.  Also discussed at length the following behavioral modifications to help maximize  success  Ways to avoid  nighttime snacking, decreasing simple carbohydrates, avoiding temptations, planning for success, keeping a strict food journal.    Hooper has been instructed to work up to a goal of 150 minutes of combined cardio and strengthening exercise per week for weight loss and overall health benefits. Written information was provided and the following handouts were given: journaling instructions, protein content of foods.    Office: [phone]  /  Fax: [phone]  OBESITY BEHAVIORAL INTERVENTION VISIT  Today's visit was # 3 out of 22.  Starting weight: 230 Starting date: 06/18/17 Today's weight : Weight: 217 lb (98.4 kg)  Today's date: 07/16/2017 Total lbs lost to date: 13 lbs  (Patients must lose 7 lbs in the first 6 months to continue with counseling)   ASK: We discussed the diagnosis of obesity with [name] today and [name] agreed to give [name] permission to discuss obesity behavioral modification therapy today.  ASSESS: Krishiv has the  diagnosis of obesity and his BMI today is 32.03 Jammal is in the action stage of change   ADVISE: Lena was educated on the multiple health risks of obesity as well as the benefit of weight loss to improve his health. He was advised of the need for long term treatment and the importance of lifestyle modifications.  AGREE: Multiple dietary modification options and treatment options were discussed and  Christy agreed to keep a food journal with 550-800  calories and 50 grams protein  and follow the Category 3 plan for bkf and lunch and journal for his dinner meal.  We discussed the following Behavioral Modification Stratagies today: increasing lean protein intake, decreasing simple carbohydrates  and ways to avoid night time snacking

## 2017-07-23 ENCOUNTER — Ambulatory Visit (INDEPENDENT_AMBULATORY_CARE_PROVIDER_SITE_OTHER): Payer: Medicare HMO | Admitting: Neurology

## 2017-07-23 ENCOUNTER — Telehealth: Payer: Self-pay | Admitting: *Deleted

## 2017-07-23 ENCOUNTER — Encounter: Payer: Self-pay | Admitting: Neurology

## 2017-07-23 VITALS — BP 117/76 | HR 84 | Wt 222.2 lb

## 2017-07-23 DIAGNOSIS — G43709 Chronic migraine without aura, not intractable, without status migrainosus: Secondary | ICD-10-CM

## 2017-07-23 MED ORDER — GABAPENTIN (ONCE-DAILY) 300 MG PO TABS: 900.0000 mg | ORAL_TABLET | Freq: Every evening | ORAL | 11 refills | Status: DC

## 2017-07-23 NOTE — Progress Notes (Signed)
[id number]    Provider:  Dr Jaynee Eagles Referring Provider: Marletta Lor, MD Primary Care Physician:  Marletta Lor, MD  CC:  Chronic Migraines  Interval history 07/23/2017:  [name] [name] is a 66 y.o. male here as a referral from Dr. Burnice Logan for Migraines. They are improved on Amitriptyline, Gralise and Aimovig. The intensity and duration are improved as well as the frequency. Also helping him sleep. However making him more tired during the early part of the day. He has a "lot of pain" in other places around his body, pain in his right thumb and index finger, he has had multiple orthopaedic operations. They moved into a new house. He has back pain, knee pain and thumb pain. He carried a lot of boxes during the move which exacerbated his symptoms. He feels like his weight is not improving, discussed these medications have weight gain.  He saw [name] a month ago, Reviewed his migraine diary in detail, talked in length about his migraines which have decreased in frequency by 48% and 36.7% in frequency however his migraine severity has reduced by 50% as did it.   Interval history 04/26/2017: patient is following for nocturnal and morning headaches. Has been diagnosed with sleep apnea but refuses CPAP machine and despite discussions that this could be the cause of his headaches. These tried multiple medications as below, we've tried treatments for migraines as well as possibly hypnic headaches. We've tried the new CGRP monoclonal antibody Aimovig. He has had a migraine every day for the last 2 weeks. Happening at night about 1/2 the time and now more during the day associated with back pain. He is taking melatonin, amitriptyline, cymbalta and flexeril at night. Recommend seeing Dr. Rexene Alberts to discuss sleep study in detail which showed sleep apnea.   Interval history 01/04/2017:Patient following up for morning headaches. MRI brain was normal. Patient has OSA  diagnosed here at [facility] but he declined to be treated despite headaches waking him up at night. I have highly encouraged him to use a cpap as this may resolve his nocturnal and morning headaches and also warned of the sequelae of untreated sleep apnea such as increased risk of stroke. He takes tylenol PM every night, cymbalta, He is on Amitriptyline 75mg  at night. He has been to PT for his musculoskeletal neck pain.   Tried: Amitriptyline, Topamax, Cymbalta, Rizatriptan, Aimovig, Melatonin, Coffe before bed (possibly hypnic headaches),   Has not tried: Depakote, propranolol, Indomethacin,   JJH:ERDEY [name] a 66 y.o.malehere as a referral from Dr. Raynelle Chary migraines. PMHx migraines. He has had migraines/headachesfor 10 years. He travels a lot and has significant osteoarthritis. He has had shoulder replacements and back fusion of l4-l5. His skeletal issues are a migraine trigger. All his migraines are in the middle of the night, he wakes up with headaches 95% of the time. He has seen a lot of doctors. Sumatriptan helps with the migraines. He has been to many doctors. He saw Dr. Melton Alar for headaches. He has daily headaches. He was taking Sumatriptan daily but now only 9 times a month and no medication overuse. Headaches start in the neck, feels like he is going to explode, all over the head, so severe he can't even look at things, painful pressure all over the head, in his eyes behind his eyes, stiffness and tense, his head is pounding, nausea, extreme light and sound sensitivity. He snores, he doesn't wake up from snoring. No aura. He will also get migraines  during the day if he is not conscious of his posture. Migraines last 8 hours. He is terrified to go to bed at night because he wakes up with headaches. No vision changes, speech changes, new weakness or sensory changes. No other focal neurologic deficits, associated symptoms, inciting events or modifiable factors.  Tried: Topiramate,  Elavil, acetaminophen, imitrex   Reviewed notes, labs and imaging from outside physicians, which showed:  Reviewed primary care notes and notes from [facility] spine center. Patient has a history of migraines. Migraines have been refractory. These responded nicely to sumatriptan but his insurance company has switched to an alternate triptan and he is quite limited in his monthly supply. He is under considerable stress recently. He tried Topamax which she did not tolerate well and recently discontinued. He is previous s/p posterior lumbar interbody fusion at L4-L5 performed on 10-07-15 with noted degenerative changes at L3-L4 with collapse to the right and noted degenerative scoliosis. He has right low back pain and right hip pain that is constant and it hurts in any position. He denies any radicular pain or numbness or weakness. He has a progressive collapse of the disc space at L3-4 that is eccentric and now accentuating the lateral listhesis at L2-3. The MR shows obvious endplate inflammation as well so they will start with ESI efforts and see if this helps.If not, he will need an extension of his fusion with correciton of the deformity up to L2.Marland Kitchen  MRI LUMBAR SPINE WITHOUT CONTRAST, 08/16/2016 10:51 AM   INDICATION: LOW BACK PAIN, >6 WKS / RED FLAG(S) / RADICULOPATHY \ \ Z98.1 S/P lumbar fusion \ M54.5 Spine pain, lumbar   COMPARISON: Lumbar spine radiograph 07/18/2016 and CT myelogram 07/12/2015   TECHNIQUE: Multiplanar, multi-sequence surface-coil MR imaging of the lumbar spine was performed without contrast.   LEVELS IMAGED: Lower thoracic to upper sacral region.   FINDINGS:  Alignment: Similar grade 1 anterolisthesis of L4 and L5. Trace retrolisthesis of L2 on L3, similar to prior.Similar levoscoliotic curvature of the spine. Vertebrae: There are fatty endplate degenerative changes at L4-L5. Increased T2/STIR signal within the endplates at U7-M5 and the inferior endplate of L2,  compatible with edema/inflammation, likely degenerative in etiology. Redemonstrated postsurgical changes of posterior lumbar interbody fusion at L4-L5 with pedicle screw and rod internal fixation. Paraspinal soft tissues: Increased T2/STIR signal within the paraspinal musculature adjacent to the operative site, which is favored to reflect postsurgical changes. Conus: Normal in signal and position, terminating at the superior aspect of L2  T12-L1: No significant focal abnormality. L1-L2: No significant focal abnormality. L2-L3: Similar trace retrolisthesis of L2 on L3 with slight uncovering of the disc. There is a left eccentric disc bulge with disc desiccation and height loss. Facet hypertrophy with ligamentum flavum thickening and facet effusions bilaterally. There is mild bilateral foraminal narrowing L3-L4: There is marked narrowing of the right aspect of the disc space at L3-4 with surrounding high STIR, low T1 signal compatible with edema, likely related to progressive degenerative changes at this level. Broad-based disc bulge with facet hypertrophy and ligamentum flavum thickening. There is moderate right lateral recess narrowing. There is moderate right foraminal narrowing and mild left foraminal narrowing. L4-L5: Grade one anterolisthesis of L4 and L5, with slight uncovering of the disc. There is facet hypertrophy and ligamentum flavum thickening without canal narrowing. There is soft tissue extending into the foramen on the right at L4-L5.  L5-S1: Left eccentric disc bulge. There is moderate left foraminal narrowing. No significant canal narrowing.  Review of Systems: Patient complains of symptoms per HPI as well as the following symptoms:No CP, no SOB. Pertinent negatives per HPI. All others negative.    Social History   Social History  . Marital status: Married    Spouse name: N/A  . Number of children: 1  . Years of education: N/A   Occupational History  . Retired      Social History Main Topics  . Smoking status: Never Smoker  . Smokeless tobacco: Never Used  . Alcohol use No  . Drug use: No  . Sexual activity: Yes    Partners: Female    Birth control/ protection: Implant   Other Topics Concern  . Not on file   Social History Narrative   Lives at home w/ his wife   Right-handed   Caffeine: 2 cups of coffee each morning    Family History  Problem Relation Age of Onset  . COPD Father        family hx  . Emphysema Father   . Aneurysm Mother   . Stroke Mother   . Sudden death Mother   . Cancer Mother   . Obesity Mother   . Cancer Maternal Grandfather        lung    Past Medical History:  Diagnosis Date  . ALLERGIC RHINITIS 04/22/2007  . Cancer (HCC)    HX SKIN CANCER  . Complication of anesthesia    more concerned about positioning of head & neck because of TMJ  . DDD (degenerative disc disease), cervical   . GERD (gastroesophageal reflux disease)    infrequently  . Headache(784.0)    migraines  . History of kidney stones   . History of skin cancer   . History of TMJ disorder   . HYPERCHOLESTEROLEMIA 04/22/2007  . HYPERLIPIDEMIA 07/29/2007  . Hyperplastic colon polyp   . NEPHROLITHIASIS, HX OF 07/29/2007  . Osteoarth NOS-Unspec 04/22/2007  . Shortness of breath   . Shoulder pain, acute    bilateral    Past Surgical History:  Procedure Laterality Date  . APPENDECTOMY    . JOINT REPLACEMENT    . KNEE ARTHROSCOPY     right x2  . SHOULDER SURGERY     right  . TOTAL KNEE ARTHROPLASTY Right 08/25/2013   Procedure: RIGHT TOTAL KNEE ARTHROPLASTY;  Surgeon: Gearlean Alf, MD;  Location: [facility];  Service: Orthopedics;  Laterality: Right;  . TOTAL SHOULDER ARTHROPLASTY Left 12/25/2013   DR SUPPLE   . TOTAL SHOULDER ARTHROPLASTY Left 12/25/2013   Procedure: LEFT TOTAL SHOULDER ARTHROPLASTY;  Surgeon: Marin Shutter, MD;  Location: No Name;  Service: Orthopedics;  Laterality: Left;  . TOTAL SHOULDER ARTHROPLASTY Right 06/18/2014    DR SUPPLE  . TOTAL SHOULDER ARTHROPLASTY Right 06/18/2014   Procedure: RIGHT TOTAL SHOULDER ARTHROPLASTY;  Surgeon: Marin Shutter, MD;  Location: [facility];  Service: Orthopedics;  Laterality: Right;    Current Outpatient Prescriptions  Medication Sig Dispense Refill  . allopurinol (ZYLOPRIM) 100 MG tablet TAKE 1 TABLET EVERY DAY 90 tablet 3  . amitriptyline (ELAVIL) 100 MG tablet Take 1 tablet (100 mg total) by mouth at bedtime. 90 tablet 3  . amitriptyline (ELAVIL) 25 MG tablet Take 1 tablet (25 mg total) by mouth at bedtime as needed for sleep. 90 tablet 3  . aspirin 81 MG tablet Take 81 mg by mouth daily.    . Cholecalciferol (VITAMIN D PO) Take 1 tablet by mouth daily.    . DULoxetine (CYMBALTA)  60 MG capsule Take 1 capsule (60 mg total) by mouth daily. 30 capsule 12  . EPINEPHRINE 0.3 mg/0.3 mL IJ SOAJ injection INJECT 0.3 MLS INTO THE MUSCLE ONCE 2 mL 1  . Erenumab-aooe (AIMOVIG) 70 MG/ML SOAJ Inject 70 mg into the skin every 30 (thirty) days. 1 pen 11  . Multiple Vitamin (MULTIVITAMIN) tablet Take 1 tablet by mouth daily.    . Omega-3 Fatty Acids (FISH OIL) 1000 MG CAPS Take 1 capsule by mouth daily.    . pravastatin (PRAVACHOL) 40 MG tablet TAKE 1 TABLET EVERY DAY 90 tablet 3  . pravastatin (PRAVACHOL) 40 MG tablet TAKE 1 TABLET EVERY DAY 90 tablet 3  . Psyllium (METAMUCIL PO) Take 2 tablets by mouth 2 (two) times daily.    . Red Yeast Rice Extract (RED YEAST RICE PO) Take 1 tablet by mouth daily.    . rizatriptan (MAXALT) 10 MG tablet Take 1 tablet (10 mg total) by mouth as needed for migraine. May repeat in 2 hours if needed 30 tablet 4  . SUMAtriptan (IMITREX) 100 MG tablet TAKE 1 TABLET EVERY 2 HOURS AS NEEDED FOR MIGRAINE OR HEADACHE 9 tablet 5  . tiZANidine (ZANAFLEX) 4 MG capsule Take 4 mg by mouth 3 (three) times daily as needed.     . TURMERIC CURCUMIN PO Take 1,050 mg by mouth daily.    . valACYclovir (VALTREX) 500 MG tablet Take 500 mg by mouth daily.    . vitamin C  (ASCORBIC ACID) 500 MG tablet Take 500 mg by mouth daily.    Marland Kitchen zolpidem (AMBIEN) 10 MG tablet Take 1 tablet (10 mg total) by mouth at bedtime as needed for sleep. 30 tablet 2   No current facility-administered medications for this visit.     Allergies as of 07/23/2017 - Review Complete 07/23/2017  Allergen Reaction Noted  . Shrimp [shellfish allergy] Anaphylaxis and Rash 05/22/2012  . Codeine Rash and Other (See Comments)   . Contrast media [iodinated diagnostic agents] Rash 11/11/2015  . Diazepam Rash 06/26/2014  . Amoxicillin Other (See Comments)   . Erythromycin Other (See Comments)   . Betadine [povidone iodine] Rash 05/22/2012  . Celebrex [celecoxib] Rash 05/22/2012  . Meperidine and related Rash 05/22/2012  . Methocarbamol Rash 05/22/2012  . Oxycodone Rash 10/14/2015  . Penicillins Rash   . Plavix [clopidogrel] Rash 12/11/2013  . Rivaroxaban Rash 10/20/2013  . Vioxx [rofecoxib] Rash 05/22/2012    Vitals: BP 117/76   Pulse 84   Wt 222 lb 3.2 oz (100.8 kg)   BMI 32.81 kg/m  Last Weight:  Wt Readings from Last 1 Encounters:  07/23/17 222 lb 3.2 oz (100.8 kg)   Last Height:   Ht Readings from Last 1 Encounters:  07/16/17 5\' 9"  (1.753 m)   Physical exam: Exam: Gen: NAD, conversant, well nourised, obese, well groomed                     Eyes: Conjunctivae clear without exudates or hemorrhage  Neuro: Detailed Neurologic Exam  Speech:    Speech is normal; fluent and spontaneous with normal comprehension.  Cognition:    The patient is oriented to person, place, and time;  Cranial Nerves:    The pupils are equal, round, and reactive to light. Visual fields are full to finger confrontation. Extraocular movements are intact. Trigeminal sensation is intact and the muscles of mastication are normal. The face is symmetric. The palate elevates in the midline. Hearing intact. Voice is normal.  Shoulder shrug is normal. The tongue has normal motion without fasciculations.    Posture:    Posture is normal. normal erect    Strength:    Strength is V/V in the upper and lower limbs.      Sensation: intact to LT       Assessment/Plan:66 year old with severe morning headaches, now occurring daily with migrainous features.  Currently on 4 migraine preventatives: Cymbalta, Amitriptyline, Gralise and Aimovig. Has failed Topiramate (Tried: Amitriptyline, Topamax, Cymbalta, Rizatriptan, Aimovig, Melatonin, Coffe before bed (possibly hypnic headaches), :   Morning headaches daily: Sleep study showed sleep apnea but patient declines treatment and also denies having sleep apnea regardless of the sleep test results. I did let patient know OSA can cause nocturnal or morning headaches. I reviewed the sleep evaluation with patient in detail today, advise him that at home studies often times under estimate the amount of sleep apnea and that he should really go back and talk to her sleep team regarding his study. Patient will see Dr. Rexene Alberts and we scheduled an appointment. He is not pursuing cpap at this time, does not believe this is an issue for him despite OSA on sleep test.  Migraines: Tried and failed multiple medications for his migraines. Patient will continue Erenumab today. Discussed Botox.  Acute medication for migraines: Sumatriptan/rizatriptan. Discussed medication overuse and not to take acute medications more than 10 days a month for migraines.   MRI brain wo contrast: normal  Ob[facility]: [facility], has lost weight and continues with good results  He has been to [facility] and did not have a good experience  Gralise Lot [id number] 06/2018 Provided 6 samples  Sarina Ill, MD  [facility] [address] [place], [place] [address]  Phone [phone] Fax [phone]  A total of 35 minutes was spent face-to-face with this patient. Over half this time was spent on counseling patient on  the chronic migraine  diagnosis and different diagnostic and therapeutic options available.

## 2017-07-24 ENCOUNTER — Encounter: Payer: Self-pay | Admitting: Neurology

## 2017-07-24 NOTE — Telephone Encounter (Signed)
So we will have to make sure he gets the free samples, how does work with medicare?

## 2017-07-24 NOTE — Telephone Encounter (Signed)
Received fax from [place] [facility] stating that [place] has been denied. # [phone]

## 2017-07-25 ENCOUNTER — Encounter: Payer: Self-pay | Admitting: Neurology

## 2017-07-25 NOTE — Telephone Encounter (Signed)
Patient calling stating he spoke with [facility] and stated Aimovig was denied because paperwork was not filled out properly and no urgency was put on it. Please call patient and discuss.

## 2017-07-25 NOTE — Telephone Encounter (Signed)
Spoke to pt when in the office on the 07-23-17.  Had not received anything from the pharmacy about needing a PA.  Called pharmacy and PA is needed.  I did cover my meds initialing PA Key [id number].   PA  [id number] was denied.  We had not received letter stating why denied.  Pt emailed copy of his letter of denial.  States not on formulary. Asking if he has tried Divalproex tablet DR, Propranolol, venlafaxine, metoprolol tartrate.  From notes: he has tried amitriptyline, topamax, cymbalta, elavil, tylenol, imitrex, maxalt, gabapentin.   Do you want to try any of these other drugs on [employer]'s formulary?

## 2017-07-25 NOTE — Telephone Encounter (Signed)
Tell him ithas been denied and lets try Ajovy instead

## 2017-07-26 ENCOUNTER — Encounter: Payer: Self-pay | Admitting: *Deleted

## 2017-07-26 ENCOUNTER — Telehealth: Payer: Self-pay | Admitting: Neurology

## 2017-07-26 NOTE — Telephone Encounter (Signed)
I spoke to pt this am.  He stated that he spoke to [facility] and PA p/w did not relay urgency, as the pt has tried multiple medications (25) over the last 10 yrs and his migraines have become increasingly debilitating for him.  He was having a 48.6% increase of migraines from Jan 1 to July 31 and from taking the aimovig samples, Aug 1 to now, he has noted a  decrease of his migraines to 36%.  He is asking for a formulary exception.  So please place this in appeal letter. He states that he has taken depakote at some time and failed.

## 2017-07-26 NOTE — Telephone Encounter (Signed)
I emailed patient last evening and very professionally told him that we should not email anymore and that we should discuss issue verbally in the morning. This morning when coming in there were 2 more emails waiting for me from patient. Patient has emailed and called excessively, multiple times daily sometimes through epic and I have also received emails to my [place] email. Patient has chronic intractable headaches and has failed multiple medications. (Of note he has obstructive sleep apnea and has declined treatment despite my urging him of the potential sequelae of untreated sleep apnea including risk of stroke and intractable headaches.). I discussed that the Aimovig process has been frustrating and that I apologized if he did not feel that we represented the proper urgency of his headache condition to [facility] or filled out the paperwork fast enough or provided enough appeals for him. Our office is working tremendously hard and we have multiple patients that we are attempting to get approval for the new CGRP medications. The process been frustrating for everyone. I also explained to him that I was giving him other options when I discussed Ajovy, another CGRP medication, that we could try and I was not saying we would not continue with our appeal for Aimovig.  I discussed patient's behavior towards this office with his multiple emails and phone calls. We try to be as available as possible to all our patients, but there has to be mutual respect between doctors in patients with regard to communication. If we had to respond to 4-5 emails a day from every patient, we would never be able to fill out paperwork for medication appeals. I informed patient we have 24-48 hours to get back to him although we usually respond in the same day, and sending multiple emails or calling multiple times will not hasten that and only hinders the process for everyone. Patient is asked to be more respectful in regards to  communication with this office and if this is not acceptable to him or he feels we are not meeting his needs and has to send these many email then possibly we are not the right neurology practice and his care may be better facilitated with another doctor.   We will send further documentation to Aimovig, I will try to get him samples in the meantime. I did discuss with him we have no idea what insurance will do, or if we can ultimately get these medications approved or what his co-pay will be. I discussed this with him at the very first appointment when we discussed the new CGRP medications due to his multiple failures, these medications are new and it is unclear what will happen. He always has the option to pay out of pocket.

## 2017-07-26 NOTE — Telephone Encounter (Signed)
I spoke to pt this am, see other note. 07/23/17.  Will do appeal letter.

## 2017-07-30 ENCOUNTER — Encounter: Payer: Self-pay | Admitting: Neurology

## 2017-07-30 NOTE — Telephone Encounter (Signed)
I LMVM for pt to return call, about specific medications that Mcarthur Rossetti is asking that he has tried.

## 2017-07-31 ENCOUNTER — Ambulatory Visit (INDEPENDENT_AMBULATORY_CARE_PROVIDER_SITE_OTHER): Payer: Medicare HMO | Admitting: Family Medicine

## 2017-07-31 VITALS — BP 112/76 | HR 75 | Temp 98.2°F | Ht 69.0 in | Wt 213.0 lb

## 2017-07-31 DIAGNOSIS — E669 Obesity, unspecified: Secondary | ICD-10-CM | POA: Diagnosis not present

## 2017-07-31 DIAGNOSIS — G43809 Other migraine, not intractable, without status migrainosus: Secondary | ICD-10-CM

## 2017-07-31 DIAGNOSIS — Z6831 Body mass index (BMI) 31.0-31.9, adult: Secondary | ICD-10-CM

## 2017-07-31 NOTE — Progress Notes (Signed)
Office: [phone]  /  Fax: [phone]   HPI:   Chief Complaint: OBESITY Leron is here to discuss his progress with his obesity treatment plan. He is on the  keep a food journal with 550-800 calories and 50 grams of protein and follow the Category 3 plan and is following his eating plan approximately 100 % of the time. He states he is walking and rides recumbent bike for 60 minutes 7 times per week. Thang continues to do well with weight loss and he is journaling carefully for dinner and making very healthy choices. He would like to journal all his meals if possible. 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  His weight is 213 lb (96.6 kg) today and has had a weight loss of 4 pounds over a period of 4 weeks since his last visit. He has lost 17 lbs since starting treatment with [name].  Migraine Headaches Shahil is now on Aimovig in addition to his other migraine medicines and notes a 30% decrease in frequency and duration but is worried about the side effects of weight gain.  ALLERGIES: Allergies  Allergen Reactions  . Shrimp [Shellfish Allergy] Anaphylaxis and Rash  . Codeine Rash and Other (See Comments)    Blacked out  . Contrast Media [Iodinated Diagnostic Agents] Rash  . Diazepam Rash    Rash over whole body from neck down, and he lost his voice (airway compromise?)  . Amoxicillin Other (See Comments)    Unknown   . Depakote [Divalproex Sodium] Swelling    Per pts report (given by Dr. Inda Merlin).  Has throat swelling and rash.  . Erythromycin Other (See Comments)    unknown  . Betadine [Povidone Iodine] Rash  . Celebrex [Celecoxib] Rash  . Meperidine And Related Rash  . Methocarbamol Rash  . Oxycodone Rash  . Penicillins Rash  . Plavix [Clopidogrel] Rash  . Rivaroxaban Rash  . Vioxx [Rofecoxib] Rash    MEDICATIONS: Current Outpatient Medications on File Prior to Visit  Medication Sig Dispense Refill  .  allopurinol (ZYLOPRIM) 100 MG tablet TAKE 1 TABLET EVERY DAY 90 tablet 3  . amitriptyline (ELAVIL) 100 MG tablet Take 1 tablet (100 mg total) by mouth at bedtime. 90 tablet 3  . amitriptyline (ELAVIL) 25 MG tablet Take 1 tablet (25 mg total) by mouth at bedtime as needed for sleep. 90 tablet 3  . aspirin 81 MG tablet Take 81 mg by mouth daily.    . Cholecalciferol (VITAMIN D PO) Take 1 tablet by mouth daily.    . DULoxetine (CYMBALTA) 60 MG capsule Take 1 capsule (60 mg total) by mouth daily. 30 capsule 12  . EPINEPHRINE 0.3 mg/0.3 mL IJ SOAJ injection INJECT 0.3 MLS INTO THE MUSCLE ONCE 2 mL 1  . Erenumab-aooe (AIMOVIG) 70 MG/ML SOAJ Inject 70 mg into the skin every 30 (thirty) days. 1 pen 11  . Gabapentin, Once-Daily, (GRALISE) 300 MG TABS Take 900 mg by mouth every evening. 180 tablet 11  . Multiple Vitamin (MULTIVITAMIN) tablet Take 1 tablet by mouth daily.    . Omega-3 Fatty Acids (FISH OIL) 1000 MG CAPS Take 1 capsule by mouth daily.    . pravastatin (PRAVACHOL) 40 MG tablet TAKE 1 TABLET EVERY DAY 90 tablet 3  . pravastatin (PRAVACHOL) 40 MG tablet TAKE 1 TABLET EVERY DAY 90 tablet 3  . Psyllium (METAMUCIL PO) Take 2 tablets by mouth 2 (two) times daily.    Marland Kitchen  Red Yeast Rice Extract (RED YEAST RICE PO) Take 1 tablet by mouth daily.    . rizatriptan (MAXALT) 10 MG tablet Take 1 tablet (10 mg total) by mouth as needed for migraine. May repeat in 2 hours if needed 30 tablet 4  . SUMAtriptan (IMITREX) 100 MG tablet TAKE 1 TABLET EVERY 2 HOURS AS NEEDED FOR MIGRAINE OR HEADACHE 9 tablet 5  . tiZANidine (ZANAFLEX) 4 MG capsule Take 4 mg by mouth 3 (three) times daily as needed.     . TURMERIC CURCUMIN PO Take 1,050 mg by mouth daily.    . valACYclovir (VALTREX) 500 MG tablet Take 500 mg by mouth daily.    . vitamin C (ASCORBIC ACID) 500 MG tablet Take 500 mg by mouth daily.    Marland Kitchen zolpidem (AMBIEN) 10 MG tablet Take 1 tablet (10 mg total) by mouth at bedtime as needed for sleep. 30 tablet 2   No  current facility-administered medications on file prior to visit.     PAST MEDICAL HISTORY: Past Medical History:  Diagnosis Date  . ALLERGIC RHINITIS 04/22/2007  . Cancer (HCC)    HX SKIN CANCER  . Complication of anesthesia    more concerned about positioning of head & neck because of TMJ  . DDD (degenerative disc disease), cervical   . GERD (gastroesophageal reflux disease)    infrequently  . Headache(784.0)    migraines  . History of kidney stones   . History of skin cancer   . History of TMJ disorder   . HYPERCHOLESTEROLEMIA 04/22/2007  . HYPERLIPIDEMIA 07/29/2007  . Hyperplastic colon polyp   . NEPHROLITHIASIS, HX OF 07/29/2007  . Osteoarth NOS-Unspec 04/22/2007  . Shortness of breath   . Shoulder pain, acute    bilateral    PAST SURGICAL HISTORY: Past Surgical History:  Procedure Laterality Date  . APPENDECTOMY    . JOINT REPLACEMENT    . KNEE ARTHROSCOPY     right x2  . SHOULDER SURGERY     right  . TOTAL SHOULDER ARTHROPLASTY Left 12/25/2013   DR SUPPLE   . TOTAL SHOULDER ARTHROPLASTY Right 06/18/2014   DR SUPPLE    SOCIAL HISTORY: Social History   Tobacco Use  . Smoking status: Never Smoker  . Smokeless tobacco: Never Used  Substance Use Topics  . Alcohol use: No    Alcohol/week: 0.0 oz  . Drug use: No    FAMILY HISTORY: Family History  Problem Relation Age of Onset  . COPD Father        family hx  . Emphysema Father   . Aneurysm Mother   . Stroke Mother   . Sudden death Mother   . Cancer Mother   . Obesity Mother   . Cancer Maternal Grandfather        lung    ROS: Review of Systems  Constitutional: Positive for weight loss.  Neurological: Positive for headaches (migraines).    PHYSICAL EXAM: Blood pressure 112/76, pulse 75, temperature 98.2 F (36.8 C), temperature source Oral, height 5\' 9"  (1.753 m), weight 213 lb (96.6 kg), SpO2 96 %. Body mass index is 31.45 kg/m. Physical Exam  Constitutional: He is oriented to person, place,  and time. He appears well-developed and well-nourished.  Cardiovascular: Normal rate.  Pulmonary/Chest: Effort normal.  Musculoskeletal: Normal range of motion.  Neurological: He is oriented to person, place, and time.  Skin: Skin is warm and dry.  Psychiatric: He has a normal mood and affect. His behavior is normal.  Vitals reviewed.   RECENT LABS AND TESTS: BMET    Component Value Date/Time   NA 139 06/18/2017 1047   K 4.6 06/18/2017 1047   CL 102 06/18/2017 1047   CO2 23 06/18/2017 1047   GLUCOSE 96 06/18/2017 1047   GLUCOSE 94 07/31/2014 0920   GLUCOSE 94 08/20/2006 0830   BUN 21 06/18/2017 1047   CREATININE 1.11 06/18/2017 1047   CALCIUM 9.0 06/18/2017 1047   GFRNONAA 69 06/18/2017 1047   GFRAA 80 06/18/2017 1047   Lab Results  Component Value Date   HGBA1C 5.8 (H) 06/18/2017   Lab Results  Component Value Date   INSULIN 16.5 06/18/2017   CBC    Component Value Date/Time   WBC 5.5 06/18/2017 1047   WBC 5.7 07/31/2014 0920   RBC 4.60 06/18/2017 1047   RBC 4.53 07/31/2014 0920   HGB 15.7 06/18/2017 1047   HCT 45.0 06/18/2017 1047   PLT 313.0 07/31/2014 0920   MCV 98 (H) 06/18/2017 1047   MCH 34.1 (H) 06/18/2017 1047   MCH 31.8 06/10/2014 0955   MCHC 34.9 06/18/2017 1047   MCHC 33.4 07/31/2014 0920   RDW 14.3 06/18/2017 1047   LYMPHSABS 1.8 06/18/2017 1047   MONOABS 0.5 07/31/2014 0920   EOSABS 0.3 06/18/2017 1047   BASOSABS 0.1 06/18/2017 1047   Iron/TIBC/Ferritin/ %Sat No results found for: IRON, TIBC, FERRITIN, IRONPCTSAT Lipid Panel     Component Value Date/Time   CHOL 229 (H) 06/18/2017 1047   TRIG 179 (H) 06/18/2017 1047   TRIG 52 08/20/2006 0830   HDL 65 06/18/2017 1047   CHOLHDL 4 07/31/2014 0920   VLDL 24.4 07/31/2014 0920   LDLCALC 128 (H) 06/18/2017 1047   LDLDIRECT 135.0 07/16/2013 0937   Hepatic Function Panel     Component Value Date/Time   PROT 7.0 06/18/2017 1047   ALBUMIN 4.3 06/18/2017 1047   AST 35 06/18/2017 1047   ALT  39 06/18/2017 1047   ALKPHOS 49 06/18/2017 1047   BILITOT 0.3 06/18/2017 1047   BILIDIR 0.1 07/31/2014 0920      Component Value Date/Time   TSH 2.920 06/18/2017 1047   TSH 1.53 07/31/2014 0920   TSH 1.85 07/16/2013 0937    ASSESSMENT AND PLAN: Other migraine without status migrainosus, not intractable  Class 1 obesity with serious comorbidity and body mass index (BMI) of 31.0 to 31.9 in adult, unspecified obesity type  PLAN:  Migraine Headaches We discussed this in depth and agreed that the benefits he gets from these medicines outweigh any side effects related to weight as he continues to do well with weight loss.  We spent > than 50% of the 30 minute visit on the counseling as documented in the note.  Obesity Amardeep is currently in the action stage of change. As such, his goal is to continue with weight loss efforts He has agreed to change to keep a food journal with 1500-1800 calories and 100+ grams of protein daily Hyun has been instructed to work up to a goal of 150 minutes of combined cardio and strengthening exercise per week for weight loss and overall health benefits. We discussed the following Behavioral Modification Strategies today: holiday eating strategies, emotional eating strategies, ways to avoid boredom eating, keeping healthy foods in the home, and travel eating strategies.   Daire has agreed to follow up with our clinic in 2 weeks with our dietitian and follow up in 4 weeks with myself. He was informed of the importance of frequent  follow up visits to maximize his success with intensive lifestyle modifications for his multiple health conditions.  I, Trixie Dredge, am acting as transcriptionist for Dennard Nip, MD  I have reviewed the above documentation for accuracy and completeness, and I agree with the above. -Dennard Nip, MD      Today's visit was # 3 out of 22.  Starting weight: 230 lbs Starting date: 06/18/17 Today's weight : 213 lbs    Today's date: 07/31/2017 Total lbs lost to date: 17 (Patients must lose 7 lbs in the first 6 months to continue with counseling)   ASK: We discussed the diagnosis of obesity with [name] today and [name] agreed to give [name] permission to discuss obesity behavioral modification therapy today.  ASSESS: Ronit has the diagnosis of obesity and his BMI today is 31.44 Kalvyn is in the action stage of change   ADVISE: Korver was educated on the multiple health risks of obesity as well as the benefit of weight loss to improve his health. He was advised of the need for long term treatment and the importance of lifestyle modifications.  AGREE: Multiple dietary modification options and treatment options were discussed and  Baldomero agreed to keep a food journal with 1500-1800 calories and 100+ grams of protein daily We discussed the following Behavioral Modification Strategies today: holiday eating strategies, emotional eating strategies, ways to avoid boredom eating, keeping healthy foods in the home, and travel eating strategies.

## 2017-07-31 NOTE — Telephone Encounter (Signed)
I spoke to pt last evening after 1700.  He is aware of the [employer] denial again. He is very frustrated.  I asked if he has taken divalroex / depakote and he relayed he has taken and had a very bad reaction (throat swelling, rash) this given by Dr. Inda Merlin pcp.  I asked about propranolol/ metoprolol and he stated that he does not have Bp problems and does not want to take this as is for Bp and not specifically given for migraines (which aimovig is).  He is to call his insurance co again and will let us know.

## 2017-08-02 MED ORDER — ERENUMAB-AOOE 70 MG/ML ~~LOC~~ SOAJ: 1.0000 "pen " | SUBCUTANEOUS | 0 refills | Status: DC

## 2017-08-02 NOTE — Telephone Encounter (Signed)
Called patient and made him aware that Aimovig samples (2 month supply) are available for pickup. He will come by tomorrow.

## 2017-08-02 NOTE — Addendum Note (Signed)
Addended by: Gildardo Griffes on: 08/02/2017 04:07 PM   Modules accepted: Orders

## 2017-08-02 NOTE — Telephone Encounter (Signed)
2 Aimovig pen in 1 box- sample ready for pickup. Sample order placed in chart incl. Lot/exp/ndc.

## 2017-08-13 ENCOUNTER — Ambulatory Visit (INDEPENDENT_AMBULATORY_CARE_PROVIDER_SITE_OTHER): Payer: Medicare HMO | Admitting: Dietician

## 2017-08-13 ENCOUNTER — Encounter (INDEPENDENT_AMBULATORY_CARE_PROVIDER_SITE_OTHER): Payer: Self-pay | Admitting: Dietician

## 2017-08-13 VITALS — Ht 69.0 in | Wt 212.0 lb

## 2017-08-13 DIAGNOSIS — Z6831 Body mass index (BMI) 31.0-31.9, adult: Secondary | ICD-10-CM

## 2017-08-13 DIAGNOSIS — E669 Obesity, unspecified: Secondary | ICD-10-CM

## 2017-08-13 DIAGNOSIS — R7303 Prediabetes: Secondary | ICD-10-CM | POA: Diagnosis not present

## 2017-08-13 DIAGNOSIS — Z9189 Other specified personal risk factors, not elsewhere classified: Secondary | ICD-10-CM | POA: Diagnosis not present

## 2017-08-15 NOTE — Progress Notes (Signed)
  Office: [phone]  /  Fax: [phone]     [name] [name] has a diagnosis of prediabetes based on his elevated HgA1c and was informed this puts him at greater risk of developing diabetes. [name] [name] is here today for diabetes risk counseling which includes his obesity treatment plan.  His weight today is 212 lbs, a 1 lb weight loss since his last visit. He has had a weight loss of 18 lbs since beginning treatment with [name].  [name] [name] is journaling his food intake using My Fitness Pal application approximately 100% of the time. His nutrition goals are 1500-1800 calories and 100+ grams of protein. Over the past 2 weeks he has met his nutrition goals approx 90% of the time. He usually exercises daily for 60 minutes however he reports due to bad weather he did not exercise as much over the past 2 weeks. He states his hunger is well controlled however he often eats out of boredom at night. Strategies to minimize nighttime snacking were discussed in detail as well Healthy eating for the Thanksgiving holiday. Reviewed the importance of lean protein and decreased simple carbohydrates for weight loss and glucose management.  His meal plan was individualized for maximum benefit.  Also discussed at length the following behavioral modifications to help maximize  Success:  holiday eating strategies, celebration eating strategies, avoiding temptations.    [name] [name] has been instructed to work up to a goal of 150 minutes of combined cardio and strengthening exercise per week for weight loss and overall health benefits. Written information was provided and the following handouts were given: Healthy holiday eating tips.    Office: [phone]  /  Fax: [phone]  OBESITY BEHAVIORAL INTERVENTION VISIT  Today's visit was # 4 out of 22.  Starting weight: 230 Starting date: 06/18/17 Today's weight : Weight: 212 lb (96.2 kg)  Today's date: 08/13/17 Total lbs lost to date: 41 (Patients must lose 7 lbs in the first 6 months to  continue with counseling)   ASK: We discussed the diagnosis of obesity with [name] [name] today and [name] [name] agreed to give [name] permission to discuss obesity behavioral modification therapy today.  ASSESS: [name] [name] has the diagnosis of obesity and his BMI today is 31.29 [name] [name] is in the action stage of change   ADVISE: [name] [name] was educated on the multiple health risks of obesity as well as the benefit of weight loss to improve his health. He was advised of the need for long term treatment and the importance of lifestyle modifications.  AGREE: Multiple dietary modification options and treatment options were discussed and  [name] [name] agreed to keep a food journal with 1500-1800  calories and 100+ grams protein  We discussed the following Behavioral Modification Stratagies today: increasing lean protein intake, decreasing simple carbohydrates , holiday eating strategies  and ways to avoid night time snacking

## 2017-08-20 ENCOUNTER — Ambulatory Visit: Payer: Self-pay | Admitting: Neurology

## 2017-08-27 ENCOUNTER — Ambulatory Visit (INDEPENDENT_AMBULATORY_CARE_PROVIDER_SITE_OTHER): Payer: Medicare HMO | Admitting: Family Medicine

## 2017-08-27 VITALS — BP 121/77 | HR 72 | Temp 98.1°F | Ht 69.0 in | Wt 210.0 lb

## 2017-08-27 DIAGNOSIS — Z6831 Body mass index (BMI) 31.0-31.9, adult: Secondary | ICD-10-CM

## 2017-08-27 DIAGNOSIS — E669 Obesity, unspecified: Secondary | ICD-10-CM

## 2017-08-27 DIAGNOSIS — G43809 Other migraine, not intractable, without status migrainosus: Secondary | ICD-10-CM

## 2017-08-28 ENCOUNTER — Encounter: Payer: Self-pay | Admitting: Neurology

## 2017-08-28 ENCOUNTER — Encounter: Payer: Self-pay | Admitting: *Deleted

## 2017-08-30 NOTE — Progress Notes (Signed)
Office: [phone]  /  Fax: [phone]   HPI:   Chief Complaint: OBESITY [name] [name] is here to discuss his progress with his obesity treatment plan. He is on the  keep a food journal with 1500 to 1800 calories and 100+ grams of protein daily and is following his eating plan approximately 65 % of the time. He states he is walking 3 to 6 miles for 45 to 90 minutes 3 times per week. [name] [name] continues to do well with weight loss, even over Thanksgiving. He has struggled with ice cream addiction and has transferred this addiction to [employer], which our registered dietician has now forbidden as he was eating 10+ bars a day. [name] [name] hunger is well controlled. His weight is 210 lb (95.3 kg) today and has had a weight loss of 2 pounds over a period of 4 weeks since his last visit. He has lost 20 lbs since starting treatment with [name].  Migraine Headache [name] [name] states migraine headaches have improved recently with weight loss and with Aimovig, but he is fighting with insurance to get them to cover this medication.  ALLERGIES: Allergies  Allergen Reactions   Shrimp [Shellfish Allergy] Anaphylaxis and Rash   Codeine Rash and Other (See Comments)    Blacked out   Contrast Media [Iodinated Diagnostic Agents] Rash   Diazepam Rash    Rash over whole body from neck down, and he lost his voice (airway compromise?)   Amoxicillin Other (See Comments)    Unknown    Depakote [Divalproex Sodium] Swelling    Per pts report (given by Dr. Inda Merlin).  Has throat swelling and rash.   Erythromycin Other (See Comments)    unknown   Betadine [Povidone Iodine] Rash   Celebrex [Celecoxib] Rash   Meperidine And Related Rash   Methocarbamol Rash   Oxycodone Rash   Penicillins Rash   Plavix [Clopidogrel] Rash   Rivaroxaban Rash   Vioxx [Rofecoxib] Rash    MEDICATIONS: Current Outpatient Medications on File Prior to Visit  Medication Sig Dispense Refill   allopurinol (ZYLOPRIM) 100 MG tablet  TAKE 1 TABLET EVERY DAY 90 tablet 3   amitriptyline (ELAVIL) 100 MG tablet Take 1 tablet (100 mg total) by mouth at bedtime. 90 tablet 3   amitriptyline (ELAVIL) 25 MG tablet Take 1 tablet (25 mg total) by mouth at bedtime as needed for sleep. 90 tablet 3   aspirin 81 MG tablet Take 81 mg by mouth daily.     Cholecalciferol (VITAMIN D PO) Take 1 tablet by mouth daily.     DULoxetine (CYMBALTA) 60 MG capsule Take 1 capsule (60 mg total) by mouth daily. 30 capsule 12   EPINEPHRINE 0.3 mg/0.3 mL IJ SOAJ injection INJECT 0.3 MLS INTO THE MUSCLE ONCE 2 mL 1   Erenumab-aooe (AIMOVIG) 70 MG/ML SOAJ Inject 70 mg into the skin every 30 (thirty) days. 1 pen 11   Erenumab-aooe (AIMOVIG) 70 MG/ML SOAJ Inject 1 pen every 30 (thirty) days into the skin. 2 pen 0   Gabapentin, Once-Daily, (GRALISE) 300 MG TABS Take 900 mg by mouth every evening. 180 tablet 11   Multiple Vitamin (MULTIVITAMIN) tablet Take 1 tablet by mouth daily.     Omega-3 Fatty Acids (FISH OIL) 1000 MG CAPS Take 1 capsule by mouth daily.     pravastatin (PRAVACHOL) 40 MG tablet TAKE 1 TABLET EVERY DAY 90 tablet 3   pravastatin (PRAVACHOL) 40 MG tablet TAKE 1 TABLET EVERY DAY 90 tablet 3   Psyllium (METAMUCIL PO)  Take 2 tablets by mouth 2 (two) times daily.     Red Yeast Rice Extract (RED YEAST RICE PO) Take 1 tablet by mouth daily.     rizatriptan (MAXALT) 10 MG tablet Take 1 tablet (10 mg total) by mouth as needed for migraine. May repeat in 2 hours if needed 30 tablet 4   SUMAtriptan (IMITREX) 100 MG tablet TAKE 1 TABLET EVERY 2 HOURS AS NEEDED FOR MIGRAINE OR HEADACHE 9 tablet 5   tiZANidine (ZANAFLEX) 4 MG capsule Take 4 mg by mouth 3 (three) times daily as needed.      TURMERIC CURCUMIN PO Take 1,050 mg by mouth daily.     valACYclovir (VALTREX) 500 MG tablet Take 500 mg by mouth daily.     vitamin C (ASCORBIC ACID) 500 MG tablet Take 500 mg by mouth daily.     zolpidem (AMBIEN) 10 MG tablet Take 1 tablet (10 mg  total) by mouth at bedtime as needed for sleep. 30 tablet 2   No current facility-administered medications on file prior to visit.     PAST MEDICAL HISTORY: Past Medical History:  Diagnosis Date   ALLERGIC RHINITIS 04/22/2007   Cancer ([place])    HX SKIN CANCER   Complication of anesthesia    more concerned about positioning of head & neck because of TMJ   DDD (degenerative disc disease), cervical    GERD (gastroesophageal reflux disease)    infrequently   Headache(784.0)    migraines   History of kidney stones    History of skin cancer    History of TMJ disorder    HYPERCHOLESTEROLEMIA 04/22/2007   HYPERLIPIDEMIA 07/29/2007   Hyperplastic colon polyp    NEPHROLITHIASIS, HX OF 07/29/2007   Osteoarth NOS-Unspec 04/22/2007   Shortness of breath    Shoulder pain, acute    bilateral    PAST SURGICAL HISTORY: Past Surgical History:  Procedure Laterality Date   APPENDECTOMY     JOINT REPLACEMENT     KNEE ARTHROSCOPY     right x2   SHOULDER SURGERY     right   TOTAL KNEE ARTHROPLASTY Right 08/25/2013   Procedure: RIGHT TOTAL KNEE ARTHROPLASTY;  Surgeon: Gearlean Alf, MD;  Location: [facility];  Service: Orthopedics;  Laterality: Right;   TOTAL SHOULDER ARTHROPLASTY Left 12/25/2013   DR SUPPLE    TOTAL SHOULDER ARTHROPLASTY Left 12/25/2013   Procedure: LEFT TOTAL SHOULDER ARTHROPLASTY;  Surgeon: Marin Shutter, MD;  Location: [place];  Service: Orthopedics;  Laterality: Left;   TOTAL SHOULDER ARTHROPLASTY Right 06/18/2014   DR SUPPLE   TOTAL SHOULDER ARTHROPLASTY Right 06/18/2014   Procedure: RIGHT TOTAL SHOULDER ARTHROPLASTY;  Surgeon: Marin Shutter, MD;  Location: [place];  Service: Orthopedics;  Laterality: Right;    SOCIAL HISTORY: Social History   Tobacco Use   Smoking status: Never Smoker   Smokeless tobacco: Never Used  Substance Use Topics   Alcohol use: No    Alcohol/week: 0.0 oz   Drug use: No    FAMILY HISTORY: Family History  Problem  Relation Age of Onset   COPD Father        family hx   Emphysema Father    Aneurysm Mother    Stroke Mother    Sudden death Mother    Cancer Mother    Obesity Mother    Cancer Maternal Grandfather        lung    ROS: Review of Systems  Constitutional: Positive for weight loss.  Neurological:  Positive for headaches (migraine).    PHYSICAL EXAM: Blood pressure 121/77, pulse 72, temperature 98.1 F (36.7 C), temperature source Oral, height 5\' 9"  (1.753 m), weight 210 lb (95.3 kg), SpO2 96 %. Body mass index is 31.01 kg/m. Physical Exam  Constitutional: He is oriented to person, place, and time. He appears well-developed and well-nourished.  Cardiovascular: Normal rate.  Pulmonary/Chest: Effort normal.  Musculoskeletal: Normal range of motion.  Neurological: He is oriented to person, place, and time.  Skin: Skin is warm and dry.  Psychiatric: He has a normal mood and affect. His behavior is normal.  Vitals reviewed.   RECENT LABS AND TESTS: BMET    Component Value Date/Time   NA 139 06/18/2017 1047   K 4.6 06/18/2017 1047   CL 102 06/18/2017 1047   CO2 23 06/18/2017 1047   GLUCOSE 96 06/18/2017 1047   GLUCOSE 94 07/31/2014 0920   GLUCOSE 94 08/20/2006 0830   BUN 21 06/18/2017 1047   CREATININE 1.11 06/18/2017 1047   CALCIUM 9.0 06/18/2017 1047   GFRNONAA 69 06/18/2017 1047   GFRAA 80 06/18/2017 1047   Lab Results  Component Value Date   HGBA1C 5.8 (H) 06/18/2017   Lab Results  Component Value Date   INSULIN 16.5 06/18/2017   CBC    Component Value Date/Time   WBC 5.5 06/18/2017 1047   WBC 5.7 07/31/2014 0920   RBC 4.60 06/18/2017 1047   RBC 4.53 07/31/2014 0920   HGB 15.7 06/18/2017 1047   HCT 45.0 06/18/2017 1047   PLT 313.0 07/31/2014 0920   MCV 98 (H) 06/18/2017 1047   MCH 34.1 (H) 06/18/2017 1047   MCH 31.8 06/10/2014 0955   MCHC 34.9 06/18/2017 1047   MCHC 33.4 07/31/2014 0920   RDW 14.3 06/18/2017 1047   LYMPHSABS 1.8 06/18/2017  1047   MONOABS 0.5 07/31/2014 0920   EOSABS 0.3 06/18/2017 1047   BASOSABS 0.1 06/18/2017 1047   Iron/TIBC/Ferritin/ %Sat No results found for: IRON, TIBC, FERRITIN, IRONPCTSAT Lipid Panel     Component Value Date/Time   CHOL 229 (H) 06/18/2017 1047   TRIG 179 (H) 06/18/2017 1047   TRIG 52 08/20/2006 0830   HDL 65 06/18/2017 1047   CHOLHDL 4 07/31/2014 0920   VLDL 24.4 07/31/2014 0920   LDLCALC 128 (H) 06/18/2017 1047   LDLDIRECT 135.0 07/16/2013 0937   Hepatic Function Panel     Component Value Date/Time   PROT 7.0 06/18/2017 1047   ALBUMIN 4.3 06/18/2017 1047   AST 35 06/18/2017 1047   ALT 39 06/18/2017 1047   ALKPHOS 49 06/18/2017 1047   BILITOT 0.3 06/18/2017 1047   BILIDIR 0.1 07/31/2014 0920      Component Value Date/Time   TSH 2.920 06/18/2017 1047   TSH 1.53 07/31/2014 0920   TSH 1.85 07/16/2013 0937    ASSESSMENT AND PLAN: Other migraine without status migrainosus, not intractable  Class 1 obesity with serious comorbidity and body mass index (BMI) of 31.0 to 31.9 in adult, unspecified obesity type  PLAN:  Migraine Headache [name] [name] was encouraged to increase his H2O intake, to avoid dehydration, which can trigger migraine headaches. [name] [name] was encouraged to continue with weight loss efforts.  We spent > than 50% of the 15 minute visit on the counseling as documented in the note.  Obesity [name] [name] is currently in the action stage of change. As such, his goal is to continue with weight loss efforts He has agreed to keep a food journal with 1600 to 1800  calories and 100+ grams of protein daily [name] [name] has been instructed to work up to a goal of 150 minutes of combined cardio and strengthening exercise per week for weight loss and overall health benefits. We discussed the following Behavioral Modification Strategies today: increase H2O intake, holiday eating strategies   [name] [name] has agreed to follow up with our clinic in 4 weeks. He was informed of the importance of  frequent follow up visits to maximize his success with intensive lifestyle modifications for his multiple health conditions.  I, Doreene Nest, am acting as transcriptionist for Dennard Nip, MD  I have reviewed the above documentation for accuracy and completeness, and I agree with the above. -Dennard Nip, MD   OBESITY BEHAVIORAL INTERVENTION VISIT  Today's visit was # 4 out of 22.  Starting weight: 230 lbs Starting date: 06/18/17 Today's weight : 210 lbs Today's date: 08/27/2017 Total lbs lost to date: 20 (Patients must lose 7 lbs in the first 6 months to continue with counseling)   ASK: We discussed the diagnosis of obesity with [name] [name] today and [name] [name] agreed to give [name] permission to discuss obesity behavioral modification therapy today.  ASSESS: [name] [name] has the diagnosis of obesity and his BMI today is 33 [name] [name] is in the action stage of change   ADVISE: [name] [name] was educated on the multiple health risks of obesity as well as the benefit of weight loss to improve his health. He was advised of the need for long term treatment and the importance of lifestyle modifications.  AGREE: Multiple dietary modification options and treatment options were discussed and  [name] [name] agreed to keep a food journal with 1600 to 1800 calories and 100+ grams of protein daily We discussed the following Behavioral Modification Strategies today: increase H2O intake and holiday eating strategies

## 2017-09-06 ENCOUNTER — Encounter: Payer: Self-pay | Admitting: Internal Medicine

## 2017-09-06 ENCOUNTER — Ambulatory Visit (INDEPENDENT_AMBULATORY_CARE_PROVIDER_SITE_OTHER): Payer: Medicare HMO | Admitting: Internal Medicine

## 2017-09-06 ENCOUNTER — Encounter: Payer: Self-pay | Admitting: Gastroenterology

## 2017-09-06 VITALS — BP 118/68 | HR 70 | Temp 97.8°F | Ht 69.0 in | Wt 218.4 lb

## 2017-09-06 DIAGNOSIS — Z0000 Encounter for general adult medical examination without abnormal findings: Secondary | ICD-10-CM | POA: Diagnosis not present

## 2017-09-06 DIAGNOSIS — E7800 Pure hypercholesterolemia, unspecified: Secondary | ICD-10-CM

## 2017-09-06 DIAGNOSIS — M150 Primary generalized (osteo)arthritis: Secondary | ICD-10-CM | POA: Diagnosis not present

## 2017-09-06 DIAGNOSIS — G43019 Migraine without aura, intractable, without status migrainosus: Secondary | ICD-10-CM | POA: Diagnosis not present

## 2017-09-06 DIAGNOSIS — M159 Polyosteoarthritis, unspecified: Secondary | ICD-10-CM

## 2017-09-06 NOTE — Progress Notes (Signed)
Subjective:    Patient ID: [name] [name], male    DOB: [birth year], 66 y.o.   MRN: [id number]  HPI  66 year old patient who is seen today for a preventive health examination and subsequent Medicare wellness visit. He has a history of significant osteoarthritis.  He has had multiple joint replacement surgeries including the right knee and both shoulders.  He has had a prior laminectomy. He has a history of dyslipidemia and remains on statin therapy.  He has been followed by neurology due to severe intractable migraine headaches. He has been on multiple drugs in the past and finally has had a nice clinical response with  Errnumsb.  His severe migraine headache frequency has improved from 10-20 episodes per month to 2-4 episodes per month.  The patient is quite pleased with his clinical response but is having a difficult time obtaining insurance coverage.  His most recent appeal has been denied.  Past Medical History:  Diagnosis Date  . ALLERGIC RHINITIS 04/22/2007  . Cancer ([facility])    HX SKIN CANCER  . Complication of anesthesia    more concerned about positioning of head & neck because of TMJ  . DDD (degenerative disc disease), cervical   . GERD (gastroesophageal reflux disease)    infrequently  . Headache(784.0)    migraines  . History of kidney stones   . History of skin cancer   . History of TMJ disorder   . HYPERCHOLESTEROLEMIA 04/22/2007  . HYPERLIPIDEMIA 07/29/2007  . Hyperplastic colon polyp   . NEPHROLITHIASIS, HX OF 07/29/2007  . Osteoarth NOS-Unspec 04/22/2007  . Shortness of breath   . Shoulder pain, acute    bilateral     Social History   Socioeconomic History  . Marital status: Married    Spouse name: Not on file  . Number of children: 1  . Years of education: Not on file  . Highest education level: Not on file  Social Needs  . Financial resource strain: Not on file  . Food insecurity - worry: Not on file  . Food insecurity - inability: Not on file  .  Transportation needs - medical: Not on file  . Transportation needs - non-medical: Not on file  Occupational History  . Occupation: Retired  Tobacco Use  . Smoking status: Never Smoker  . Smokeless tobacco: Never Used  Substance and Sexual Activity  . Alcohol use: No    Alcohol/week: 0.0 oz  . Drug use: No  . Sexual activity: Yes    Partners: Female    Birth control/protection: Implant  Other Topics Concern  . Not on file  Social History Narrative   Lives at home w/ his wife   Right-handed   Caffeine: 2 cups of coffee each morning    Past Surgical History:  Procedure Laterality Date  . APPENDECTOMY    . JOINT REPLACEMENT    . KNEE ARTHROSCOPY     right x2  . SHOULDER SURGERY     right  . TOTAL KNEE ARTHROPLASTY Right 08/25/2013   Procedure: RIGHT TOTAL KNEE ARTHROPLASTY;  Surgeon: Gearlean Alf, MD;  Location: [facility];  Service: Orthopedics;  Laterality: Right;  . TOTAL SHOULDER ARTHROPLASTY Left 12/25/2013   DR SUPPLE   . TOTAL SHOULDER ARTHROPLASTY Left 12/25/2013   Procedure: LEFT TOTAL SHOULDER ARTHROPLASTY;  Surgeon: Marin Shutter, MD;  Location: [place];  Service: Orthopedics;  Laterality: Left;  . TOTAL SHOULDER ARTHROPLASTY Right 06/18/2014   DR SUPPLE  . TOTAL SHOULDER ARTHROPLASTY Right  06/18/2014   Procedure: RIGHT TOTAL SHOULDER ARTHROPLASTY;  Surgeon: Marin Shutter, MD;  Location: [place];  Service: Orthopedics;  Laterality: Right;    Family History  Problem Relation Age of Onset  . COPD Father        family hx  . Emphysema Father   . Aneurysm Mother   . Stroke Mother   . Sudden death Mother   . Cancer Mother   . Obesity Mother   . Cancer Maternal Grandfather        lung    Allergies  Allergen Reactions  . Shrimp [Shellfish Allergy] Anaphylaxis and Rash  . Codeine Rash and Other (See Comments)    Blacked out  . Contrast Media [Iodinated Diagnostic Agents] Rash  . Diazepam Rash    Rash over whole body from neck down, and he lost his voice (airway  compromise?)  . Amoxicillin Other (See Comments)    Unknown   . Depakote [Divalproex Sodium] Swelling    Per pts report (given by Dr. Inda Merlin).  Has throat swelling and rash.  . Erythromycin Other (See Comments)    unknown  . Betadine [Povidone Iodine] Rash  . Celebrex [Celecoxib] Rash  . Meperidine And Related Rash  . Methocarbamol Rash  . Oxycodone Rash  . Penicillins Rash  . Plavix [Clopidogrel] Rash  . Rivaroxaban Rash  . Vioxx [Rofecoxib] Rash    Current Outpatient Medications on File Prior to Visit  Medication Sig Dispense Refill  . allopurinol (ZYLOPRIM) 100 MG tablet TAKE 1 TABLET EVERY DAY 90 tablet 3  . amitriptyline (ELAVIL) 100 MG tablet Take 1 tablet (100 mg total) by mouth at bedtime. 90 tablet 3  . amitriptyline (ELAVIL) 25 MG tablet Take 1 tablet (25 mg total) by mouth at bedtime as needed for sleep. 90 tablet 3  . aspirin 81 MG tablet Take 81 mg by mouth daily.    . Cholecalciferol (VITAMIN D PO) Take 1 tablet by mouth daily.    . DULoxetine (CYMBALTA) 60 MG capsule Take 1 capsule (60 mg total) by mouth daily. 30 capsule 12  . EPINEPHRINE 0.3 mg/0.3 mL IJ SOAJ injection INJECT 0.3 MLS INTO THE MUSCLE ONCE 2 mL 1  . Erenumab-aooe (AIMOVIG) 70 MG/ML SOAJ Inject 70 mg into the skin every 30 (thirty) days. 1 pen 11  . Erenumab-aooe (AIMOVIG) 70 MG/ML SOAJ Inject 1 pen every 30 (thirty) days into the skin. 2 pen 0  . Gabapentin, Once-Daily, (GRALISE) 300 MG TABS Take 900 mg by mouth every evening. 180 tablet 11  . Multiple Vitamin (MULTIVITAMIN) tablet Take 1 tablet by mouth daily.    . Omega-3 Fatty Acids (FISH OIL) 1000 MG CAPS Take 1 capsule by mouth daily.    . pravastatin (PRAVACHOL) 40 MG tablet TAKE 1 TABLET EVERY DAY 90 tablet 3  . pravastatin (PRAVACHOL) 40 MG tablet TAKE 1 TABLET EVERY DAY 90 tablet 3  . Psyllium (METAMUCIL PO) Take 2 tablets by mouth 2 (two) times daily.    . Red Yeast Rice Extract (RED YEAST RICE PO) Take 1 tablet by mouth daily.    .  rizatriptan (MAXALT) 10 MG tablet Take 1 tablet (10 mg total) by mouth as needed for migraine. May repeat in 2 hours if needed 30 tablet 4  . SUMAtriptan (IMITREX) 100 MG tablet TAKE 1 TABLET EVERY 2 HOURS AS NEEDED FOR MIGRAINE OR HEADACHE 9 tablet 5  . tiZANidine (ZANAFLEX) 4 MG capsule Take 4 mg by mouth 3 (three) times daily as  needed.     . TURMERIC CURCUMIN PO Take 1,050 mg by mouth daily.    . valACYclovir (VALTREX) 500 MG tablet Take 500 mg by mouth daily.    . vitamin C (ASCORBIC ACID) 500 MG tablet Take 500 mg by mouth daily.    Marland Kitchen zolpidem (AMBIEN) 10 MG tablet Take 1 tablet (10 mg total) by mouth at bedtime as needed for sleep. 30 tablet 2   No current facility-administered medications on file prior to visit.     BP 118/68 (BP Location: Left Arm, Patient Position: Sitting, Cuff Size: Normal)   Pulse 70   Temp 97.8 F (36.6 C) (Oral)   Ht 5\' 9"  (1.753 m)   Wt 218 lb 6.4 oz (99.1 kg)   SpO2 94%   BMI 32.25 kg/m   Subsequent Medicare wellness visit  1. Risk factors, based on past  M,S,F history.  Cardiovascular risk factors include a history of dyslipidemia.  2.  Physical activities: Has been much more active recently following successful orthopedic surgeries.  Presently is having significant pain involving the right thumb area.  3.  Depression/mood: No history of major depression or mood disorder  4.  Hearing: No deficits  5.  ADL's: Independent  6.  Fall risk: Low  7.  Home safety: No problems identified  8.  Height weight, and visual acuity; height and weight stable no change in visual acuity  9.  Counseling:  Continue heart healthy diet.  Patient is presently participating in a bariatric clinic and attempt to lose weight.  To date he has been quite successful 10. Lab orders based on risk factors: Recent laboratory studies reviewed from bariatric clinic   11. Referral : Follow-up of a general orthopedic as well as nutrition clinic  12. Care plan: Continue  efforts at aggressive risk factor modification  13. Cognitive assessment: Alert and oriented with normal affect no cognitive dysfunction.  Needs follow-up colonoscopy  14. Screening: Patient provided with a written and personalized 5-10 year screening schedule in the AVS.    15. Provider List Update: Orthopedic surgery neurology bariatric clinic primary care ophthalmology    Review of Systems  Constitutional: Negative for appetite change, chills, fatigue and fever.  HENT: Negative for congestion, dental problem, ear pain, hearing loss, sore throat, tinnitus, trouble swallowing and voice change.   Eyes: Negative for pain, discharge and visual disturbance.  Respiratory: Negative for cough, chest tightness, wheezing and stridor.   Cardiovascular: Negative for chest pain, palpitations and leg swelling.  Gastrointestinal: Negative for abdominal distention, abdominal pain, blood in stool, constipation, diarrhea, nausea and vomiting.  Genitourinary: Negative for difficulty urinating, discharge, flank pain, genital sores, hematuria and urgency.  Musculoskeletal: Positive for arthralgias and back pain. Negative for gait problem, joint swelling, myalgias and neck stiffness.  Skin: Negative for rash.  Neurological: Positive for headaches. Negative for dizziness, syncope, speech difficulty, weakness and numbness.  Hematological: Negative for adenopathy. Does not bruise/bleed easily.  Psychiatric/Behavioral: Negative for behavioral problems and dysphoric mood. The patient is not nervous/anxious.        Objective:   Physical Exam  Constitutional: He appears well-developed and well-nourished.  HENT:  Head: Normocephalic and atraumatic.  Right Ear: External ear normal.  Left Ear: External ear normal.  Nose: Nose normal.  Mouth/Throat: Oropharynx is clear and moist.  Eyes: Conjunctivae and EOM are normal. Pupils are equal, round, and reactive to light. No scleral icterus.  Neck: Normal range of  motion. Neck supple. No JVD present. No thyromegaly  present.  Cardiovascular: Regular rhythm, normal heart sounds and intact distal pulses. Exam reveals no gallop and no friction rub.  No murmur heard. Pulmonary/Chest: Effort normal and breath sounds normal. He exhibits no tenderness.  Abdominal: Soft. Bowel sounds are normal. He exhibits no distension and no mass. There is no tenderness.  Genitourinary: Prostate normal and penis normal. Rectal exam shows guaiac negative stool.  Musculoskeletal: Normal range of motion. He exhibits no edema or tenderness.  Surgical scars right knee and both shoulders.  Status post laminectomy  Lymphadenopathy:    He has no cervical adenopathy.  Neurological: He is alert. He has normal reflexes. No cranial nerve deficit. Coordination normal.  Skin: Skin is warm and dry. No rash noted.  Psychiatric: He has a normal mood and affect. His behavior is normal.          Assessment & Plan:   Preventive health examination Subsequent Medicare wellness visit Dyslipidemia.  Lipid profile well controlled mild obesity.  Continue efforts at weight loss.  Follow-up bariatric clinic  Migraine headaches.  Presently stable.  Hopefully to maintain patient on present regimen.  Follow-up neurology  Follow-up colonoscopy Return here 1 year or as needed Orthopedic follow-up as necessary  Nyoka Cowden

## 2017-09-06 NOTE — Patient Instructions (Signed)
Neurology follow-up as scheduled  Schedule your colonoscopy to help detect colon cancer.  Orthopedic follow-up as necessary

## 2017-09-14 ENCOUNTER — Ambulatory Visit: Payer: Medicare HMO | Admitting: Family Medicine

## 2017-09-14 ENCOUNTER — Encounter: Payer: Self-pay | Admitting: Family Medicine

## 2017-09-14 ENCOUNTER — Encounter: Payer: Self-pay | Admitting: Internal Medicine

## 2017-09-14 ENCOUNTER — Telehealth: Payer: Self-pay | Admitting: Internal Medicine

## 2017-09-14 VITALS — BP 122/76 | HR 78 | Temp 99.1°F | Resp 16 | Ht 69.0 in | Wt 218.5 lb

## 2017-09-14 DIAGNOSIS — J309 Allergic rhinitis, unspecified: Secondary | ICD-10-CM | POA: Diagnosis not present

## 2017-09-14 DIAGNOSIS — J988 Other specified respiratory disorders: Secondary | ICD-10-CM | POA: Diagnosis not present

## 2017-09-14 MED ORDER — PREDNISONE 20 MG PO TABS: 40.0000 mg | ORAL_TABLET | Freq: Every day | ORAL | 0 refills | Status: AC

## 2017-09-14 MED ORDER — DOXYCYCLINE HYCLATE 100 MG PO TABS: 100.0000 mg | ORAL_TABLET | Freq: Two times a day (BID) | ORAL | 0 refills | Status: AC

## 2017-09-14 MED ORDER — BENZONATATE 100 MG PO CAPS: 200.0000 mg | ORAL_CAPSULE | Freq: Two times a day (BID) | ORAL | 0 refills | Status: AC | PRN

## 2017-09-14 NOTE — Progress Notes (Signed)
ACUTE VISIT   HPI:  Chief Complaint  Patient presents with  . Cough    causing migraine, started Sunday  . Nasal Congestion    [name] is a 66 y.o. male, who is here today complaining of 5 days of respiratory symptoms.  Productive "terrible deep" cough with "thick mucus" that causes headache and sore throat. He reports Hx of migraine and has headache "50% of the day" as his baseline. He denies hemoptysis.  He has not noted fever, chill, or myalgias. Moderate nasal congestion, rhinorrhea,and post nasal drainage.  Denies chest pain or dyspnea. "Wheezing all the time." No Hx of asthma or COPD.   No Hx of recent travel. No sick contact. No known insect bite. Hx of allergies, allergic rhinitis.   OTC medications for this problem: Initially he took Mucinex but noted rash a few days after,so discontinued. He took Benadryl a few times. Symptoms getting worse.   Review of Systems  Constitutional: Positive for fatigue. Negative for activity change, appetite change, chills and fever.  HENT: Positive for congestion, rhinorrhea, sinus pressure, sore throat and voice change. Negative for ear pain, mouth sores, nosebleeds and trouble swallowing.   Eyes: Negative for discharge, redness and itching.  Respiratory: Positive for cough. Negative for chest tightness, shortness of breath and wheezing.   Cardiovascular: Negative for leg swelling.  Gastrointestinal: Negative for abdominal pain, diarrhea, nausea and vomiting.  Musculoskeletal: Negative for back pain, joint swelling and neck pain.  Skin: Negative for rash.  Allergic/Immunologic: Positive for environmental allergies.  Neurological: Positive for headaches. Negative for syncope and weakness.  Hematological: Negative for adenopathy. Does not bruise/bleed easily.  Psychiatric/Behavioral: Negative for confusion. The patient is nervous/anxious.       Current Outpatient Medications on File Prior to Visit    Medication Sig Dispense Refill  . allopurinol (ZYLOPRIM) 100 MG tablet TAKE 1 TABLET EVERY DAY 90 tablet 3  . amitriptyline (ELAVIL) 100 MG tablet Take 1 tablet (100 mg total) by mouth at bedtime. 90 tablet 3  . amitriptyline (ELAVIL) 25 MG tablet Take 1 tablet (25 mg total) by mouth at bedtime as needed for sleep. 90 tablet 3  . aspirin 81 MG tablet Take 81 mg by mouth daily.    . Cholecalciferol (VITAMIN D PO) Take 1 tablet by mouth daily.    . DULoxetine (CYMBALTA) 60 MG capsule Take 1 capsule (60 mg total) by mouth daily. 30 capsule 12  . EPINEPHRINE 0.3 mg/0.3 mL IJ SOAJ injection INJECT 0.3 MLS INTO THE MUSCLE ONCE 2 mL 1  . Erenumab-aooe (AIMOVIG) 70 MG/ML SOAJ Inject 70 mg into the skin every 30 (thirty) days. 1 pen 11  . Gabapentin, Once-Daily, (GRALISE) 300 MG TABS Take 900 mg by mouth every evening. 180 tablet 11  . Multiple Vitamin (MULTIVITAMIN) tablet Take 1 tablet by mouth daily.    . Omega-3 Fatty Acids (FISH OIL) 1000 MG CAPS Take 1 capsule by mouth daily.    . pravastatin (PRAVACHOL) 40 MG tablet TAKE 1 TABLET EVERY DAY 90 tablet 3  . Psyllium (METAMUCIL PO) Take 2 tablets by mouth 2 (two) times daily.    . Red Yeast Rice Extract (RED YEAST RICE PO) Take 1 tablet by mouth daily.    . rizatriptan (MAXALT) 10 MG tablet Take 1 tablet (10 mg total) by mouth as needed for migraine. May repeat in 2 hours if needed 30 tablet 4  . SUMAtriptan (IMITREX) 100 MG tablet TAKE 1 TABLET EVERY  2 HOURS AS NEEDED FOR MIGRAINE OR HEADACHE 9 tablet 5  . tiZANidine (ZANAFLEX) 4 MG capsule Take 4 mg by mouth 3 (three) times daily as needed.     . TURMERIC CURCUMIN PO Take 1,050 mg by mouth daily.    . valACYclovir (VALTREX) 500 MG tablet Take 500 mg by mouth daily.    . vitamin C (ASCORBIC ACID) 500 MG tablet Take 500 mg by mouth daily.    Marland Kitchen zolpidem (AMBIEN) 10 MG tablet Take 1 tablet (10 mg total) by mouth at bedtime as needed for sleep. 30 tablet 2   No current facility-administered  medications on file prior to visit.      Past Medical History:  Diagnosis Date  . ALLERGIC RHINITIS 04/22/2007  . Cancer (HCC)    HX SKIN CANCER  . Complication of anesthesia    more concerned about positioning of head & neck because of TMJ  . DDD (degenerative disc disease), cervical   . GERD (gastroesophageal reflux disease)    infrequently  . Headache(784.0)    migraines  . History of kidney stones   . History of skin cancer   . History of TMJ disorder   . HYPERCHOLESTEROLEMIA 04/22/2007  . HYPERLIPIDEMIA 07/29/2007  . Hyperplastic colon polyp   . NEPHROLITHIASIS, HX OF 07/29/2007  . Osteoarth NOS-Unspec 04/22/2007  . Shortness of breath   . Shoulder pain, acute    bilateral   Allergies  Allergen Reactions  . Shrimp [Shellfish Allergy] Anaphylaxis and Rash  . Codeine Rash and Other (See Comments)    Blacked out  . Contrast Media [Iodinated Diagnostic Agents] Rash  . Diazepam Rash    Rash over whole body from neck down, and he lost his voice (airway compromise?)  . Amoxicillin Other (See Comments)    Unknown   . Depakote [Divalproex Sodium] Swelling    Per pts report (given by Dr. Inda Merlin).  Has throat swelling and rash.  . Erythromycin Other (See Comments)    unknown  . Betadine [Povidone Iodine] Rash  . Celebrex [Celecoxib] Rash  . Meperidine And Related Rash  . Methocarbamol Rash  . Oxycodone Rash  . Penicillins Rash  . Plavix [Clopidogrel] Rash  . Rivaroxaban Rash  . Vioxx [Rofecoxib] Rash    Social History   Socioeconomic History  . Marital status: Married    Spouse name: None  . Number of children: 1  . Years of education: None  . Highest education level: None  Social Needs  . Financial resource strain: None  . Food insecurity - worry: None  . Food insecurity - inability: None  . Transportation needs - medical: None  . Transportation needs - non-medical: None  Occupational History  . Occupation: Retired  Tobacco Use  . Smoking status: Never  Smoker  . Smokeless tobacco: Never Used  Substance and Sexual Activity  . Alcohol use: No    Alcohol/week: 0.0 oz  . Drug use: No  . Sexual activity: Yes    Partners: Female    Birth control/protection: Implant  Other Topics Concern  . None  Social History Narrative   Lives at home w/ his wife   Right-handed   Caffeine: 2 cups of coffee each morning    Vitals:   09/14/17 1428  BP: 122/76  Pulse: 78  Resp: 16  Temp: 99.1 F (37.3 C)  SpO2: 96%   Body mass index is 32.26 kg/m.   Physical Exam  Nursing note and vitals reviewed. Constitutional: He is oriented  to person, place, and time. He appears well-developed. He does not appear ill. No distress.  HENT:  Head: Normocephalic and atraumatic.  Right Ear: Tympanic membrane, external ear and ear canal normal.  Left Ear: Tympanic membrane, external ear and ear canal normal.  Nose: Rhinorrhea present. Right sinus exhibits no maxillary sinus tenderness and no frontal sinus tenderness. Left sinus exhibits no maxillary sinus tenderness and no frontal sinus tenderness.  Mouth/Throat: Oropharynx is clear and moist and mucous membranes are normal.  Cerumen excess left ear. Nasal voice. Post nasal drainage. Sinus transillumination normal.  Eyes: Conjunctivae are normal.  Cardiovascular: Normal rate and regular rhythm.  No murmur heard. Respiratory: Effort normal and breath sounds normal. No stridor. No respiratory distress.  Lymphadenopathy:       Head (right side): No submandibular adenopathy present.       Head (left side): No submandibular adenopathy present.    He has no cervical adenopathy.  Neurological: He is alert and oriented to person, place, and time. He has normal strength. Gait normal.  Skin: Skin is warm. No rash noted. No erythema.  Psychiatric: His mood appears anxious.  Fairly groomed, good eye contact.      ASSESSMENT AND PLAN:   Mr. [name] [name] was seen today for cough and nasal congestion.  Diagnoses and  all orders for this visit:  Respiratory tract infection  Most likely viral. Rx for abx sent to start in 3 days if not any better or if fever develops. Today lung auscultation negative for rales or wheezing. Explained that nasal congestion and cough can last a few more days and sometimes weeks.  Instructed about warning signs.  -     doxycycline (VIBRA-TABS) 100 MG tablet; Take 1 tablet (100 mg total) by mouth 2 (two) times daily for 7 days. With food -     benzonatate (TESSALON) 100 MG capsule; Take 2 capsules (200 mg total) by mouth 2 (two) times daily as needed for up to 10 days.  Allergic rhinitis, unspecified seasonality, unspecified trigger  Could be aggravating symptoms. Prednisone may help with acute rhinitis and sinus congestion. Side effects of Prednisone discussed. Nasal irrigations with saline as needed. F/U with PCP as needed.  -     predniSONE (DELTASONE) 20 MG tablet; Take 2 tablets (40 mg total) by mouth daily with breakfast for 5 days.      -[name] was advised to seek immediate medical attention if sudden worsening symptoms or to follow if they persist or if new concerns arise.       Nazyia Gaugh G. Martinique, MD  [facility]. [place] office.

## 2017-09-14 NOTE — Patient Instructions (Signed)
Acute visit:   URI, acute  Allergic rhinitis, unspecified seasonality, unspecified trigger - Plan: predniSONE (DELTASONE) 20 MG tablet  Respiratory tract infection - Plan: benzonatate (TESSALON) 100 MG capsule  viral infections are self-limited and we treat each symptom depending of severity.  Over the counter medications as decongestants and cold medications usually help, they need to be taken with caution if there is a history of high blood pressure or palpitations. Tylenol and/or Ibuprofen also helps with most symptoms (headache, muscle aching, fever,etc) Plenty of fluids. Honey helps with cough. Steam inhalations helps with runny nose, nasal congestion, and may prevent sinus infections. Cough and nasal congestion could last a few days and sometimes weeks. Please follow in not any better in 1-2 weeks or if symptoms get worse.   Start antibiotic just if not any better in 3-4 days of new onset of fever.  There are 2 forms of allergic rhinitis: . Seasonal (hay fever): Caused by an allergy to pollen and/or mold spores in the air. Pollen is the fine powder that comes from the stamen of flowering plants. It can be carried through the air and is easily inhaled. Symptoms are seasonal and usually occur in spring, late summer, and fall. Marland Kitchen Perennial: Caused by other allergens such as dust mites, pet hair or dander, or mold. Symptoms occur year-round.  Symptoms: Your symptoms can vary, depending on the severity of your allergies. Symptoms can include: Sneezing, coughing.itching (mostly eyes, nose, mouth, throat and skin),runny nose,stuffy nose.headache,pressure in the nose and cheeks,ear fullness and popping, sore throat.watery, red, or swollen eyes,dark circles under your eyes,trouble smelling, and sometimes hives.  Allergic rhinitis cannot be prevented. You can help your symptoms by avoiding the things that you are allergic, including: . Keeping windows closed. This is especially important  during high-pollen seasons. . Washing your hands after petting animals. . Using dust- and mite-proof bedding and mattress covers. . Wearing glasses outside to protect your eyes. . Showering before bed to wash off allergens from hair and skin. You can also avoid things that can make your symptoms worse, such as: . aerosol sprays . air pollution . cold temperatures . humidity . irritating fumes . tobacco smoke . wind . wood smoke.   Antihistamines help reduce the sneezing, runny nose, and itchiness of allergies. These come in pill form and as nasal sprays. Allegra,Zyrtec,or Claritin are some examples. Decongestants, such as pseudoephedrine and phenylephrine, help temporarily relieve the stuffy nose of allergies. Decongestants are found in many medicines and come as pills, nose sprays, and nose drops. They could increase heart rate and cause tachycardia and tremor. Nasal Afrin should not be used for more than 3 days because you can become dependent on them. This causes you to feel even more stopped-up when you try to quit using them.  Nasal sprays: steroids or antihistaminics. Over the counter intranasal sterids: Nasocort,Rhinocort,or Flonase.You won't notice their benefits for up to 2 weeks after starting them. Allergy shots or sublingual tablets when other treatment do not help.This is done by immunologists.   Please be sure medication list is accurate. If a new problem present, please set up appointment sooner than planned today.

## 2017-09-14 NOTE — Telephone Encounter (Signed)
Verified patient on the schedule today to see Dr. Martinique.

## 2017-09-14 NOTE — Telephone Encounter (Signed)
Agent has scheduled patient an appointment this afternoon for his symptoms- he did mention that he has had a reaction to the Musinex DM- he had been taking it since Tuesday and yesterday he started with a mild rash. He has started benadryl- and at 4 am this morning his hands/arms were bright red. He has continued the benadryl and pushing his fluids and he is slowly improving.He states he is 20% better. He states he has a large list of allergies and he is not in distress. He is to continue the benadryl dosing and pushing his fluids.He has an appointment this afternoon and if he has any distress he knows to call 911 or go to ED.

## 2017-09-25 HISTORY — PX: COLONOSCOPY: SHX174

## 2017-09-26 ENCOUNTER — Encounter (INDEPENDENT_AMBULATORY_CARE_PROVIDER_SITE_OTHER): Payer: Self-pay | Admitting: Family Medicine

## 2017-09-26 ENCOUNTER — Ambulatory Visit (INDEPENDENT_AMBULATORY_CARE_PROVIDER_SITE_OTHER): Payer: Medicare HMO | Admitting: Family Medicine

## 2017-09-26 VITALS — BP 119/75 | HR 71 | Temp 97.9°F | Ht 69.0 in | Wt 214.0 lb

## 2017-09-26 DIAGNOSIS — E669 Obesity, unspecified: Secondary | ICD-10-CM | POA: Diagnosis not present

## 2017-09-26 DIAGNOSIS — Z6831 Body mass index (BMI) 31.0-31.9, adult: Secondary | ICD-10-CM

## 2017-09-26 DIAGNOSIS — E7849 Other hyperlipidemia: Secondary | ICD-10-CM

## 2017-09-26 NOTE — Progress Notes (Signed)
Office: [phone]  /  Fax: [phone]   HPI:   Chief Complaint: OBESITY [name] [name] is here to discuss his progress with his obesity treatment plan. He is on the keep a food journal with 1600-1800 calories and 100+ grams of protein daily and is following his eating plan approximately 65 % of the time. He states he is cardio for 30 minutes 3 times per week. [name] [name] has tried to increase protein but also has had increase celebration eating. He was sick with upper respiratory infection for much of the time and antibiotics and prednisone.  His weight is 214 lb (97.1 kg) today and has gained 4 pounds since his last visit. He has lost 16 lbs since starting treatment with [name].  Hyperlipidemia [name] [name] has hyperlipidemia and has been attempting to diet control. He is on fish oil and pravastatin and trying to improve his cholesterol levels with intensive lifestyle modification including a low saturated fat diet, exercise and weight loss. He denies any chest pain, claudication or myalgias.  ALLERGIES: Allergies  Allergen Reactions  . Shrimp [Shellfish Allergy] Anaphylaxis and Rash  . Codeine Rash and Other (See Comments)    Blacked out  . Contrast Media [Iodinated Diagnostic Agents] Rash  . Diazepam Rash    Rash over whole body from neck down, and he lost his voice (airway compromise?)  . Amoxicillin Other (See Comments)    Unknown   . Depakote [Divalproex Sodium] Swelling    Per pts report (given by Dr. Inda Merlin).  Has throat swelling and rash.  . Erythromycin Other (See Comments)    unknown  . Betadine [Povidone Iodine] Rash  . Celebrex [Celecoxib] Rash  . Meperidine And Related Rash  . Methocarbamol Rash  . Oxycodone Rash  . Penicillins Rash  . Plavix [Clopidogrel] Rash  . Rivaroxaban Rash  . Vioxx [Rofecoxib] Rash    MEDICATIONS: Current Outpatient Medications on File Prior to Visit  Medication Sig Dispense Refill  . allopurinol (ZYLOPRIM) 100 MG tablet TAKE 1 TABLET EVERY DAY 90  tablet 3  . amitriptyline (ELAVIL) 100 MG tablet Take 1 tablet (100 mg total) by mouth at bedtime. 90 tablet 3  . amitriptyline (ELAVIL) 25 MG tablet Take 1 tablet (25 mg total) by mouth at bedtime as needed for sleep. 90 tablet 3  . aspirin 81 MG tablet Take 81 mg by mouth daily.    . Cholecalciferol (VITAMIN D PO) Take 1 tablet by mouth daily.    . DULoxetine (CYMBALTA) 60 MG capsule Take 1 capsule (60 mg total) by mouth daily. 30 capsule 12  . EPINEPHRINE 0.3 mg/0.3 mL IJ SOAJ injection INJECT 0.3 MLS INTO THE MUSCLE ONCE 2 mL 1  . Erenumab-aooe (AIMOVIG) 70 MG/ML SOAJ Inject 70 mg into the skin every 30 (thirty) days. 1 pen 11  . Gabapentin, Once-Daily, (GRALISE) 300 MG TABS Take 900 mg by mouth every evening. 180 tablet 11  . Multiple Vitamin (MULTIVITAMIN) tablet Take 1 tablet by mouth daily.    . Omega-3 Fatty Acids (FISH OIL) 1000 MG CAPS Take 1 capsule by mouth daily.    . pravastatin (PRAVACHOL) 40 MG tablet TAKE 1 TABLET EVERY DAY 90 tablet 3  . Psyllium (METAMUCIL PO) Take 2 tablets by mouth 2 (two) times daily.    . Red Yeast Rice Extract (RED YEAST RICE PO) Take 1 tablet by mouth daily.    . rizatriptan (MAXALT) 10 MG tablet Take 1 tablet (10 mg total) by mouth as needed for migraine. May repeat in  2 hours if needed 30 tablet 4  . SUMAtriptan (IMITREX) 100 MG tablet TAKE 1 TABLET EVERY 2 HOURS AS NEEDED FOR MIGRAINE OR HEADACHE 9 tablet 5  . tiZANidine (ZANAFLEX) 4 MG capsule Take 4 mg by mouth 3 (three) times daily as needed.     . TURMERIC CURCUMIN PO Take 1,050 mg by mouth daily.    . valACYclovir (VALTREX) 500 MG tablet Take 500 mg by mouth daily.    . vitamin C (ASCORBIC ACID) 500 MG tablet Take 500 mg by mouth daily.    Marland Kitchen zolpidem (AMBIEN) 10 MG tablet Take 1 tablet (10 mg total) by mouth at bedtime as needed for sleep. 30 tablet 2   No current facility-administered medications on file prior to visit.     PAST MEDICAL HISTORY: Past Medical History:  Diagnosis Date  .  ALLERGIC RHINITIS 04/22/2007  . Cancer (HCC)    HX SKIN CANCER  . Complication of anesthesia    more concerned about positioning of head & neck because of TMJ  . DDD (degenerative disc disease), cervical   . GERD (gastroesophageal reflux disease)    infrequently  . Headache(784.0)    migraines  . History of kidney stones   . History of skin cancer   . History of TMJ disorder   . HYPERCHOLESTEROLEMIA 04/22/2007  . HYPERLIPIDEMIA 07/29/2007  . Hyperplastic colon polyp   . NEPHROLITHIASIS, HX OF 07/29/2007  . Osteoarth NOS-Unspec 04/22/2007  . Shortness of breath   . Shoulder pain, acute    bilateral    PAST SURGICAL HISTORY: Past Surgical History:  Procedure Laterality Date  . APPENDECTOMY    . JOINT REPLACEMENT    . KNEE ARTHROSCOPY     right x2  . SHOULDER SURGERY     right  . TOTAL KNEE ARTHROPLASTY Right 08/25/2013   Procedure: RIGHT TOTAL KNEE ARTHROPLASTY;  Surgeon: Gearlean Alf, MD;  Location: [facility];  Service: Orthopedics;  Laterality: Right;  . TOTAL SHOULDER ARTHROPLASTY Left 12/25/2013   DR SUPPLE   . TOTAL SHOULDER ARTHROPLASTY Left 12/25/2013   Procedure: LEFT TOTAL SHOULDER ARTHROPLASTY;  Surgeon: Marin Shutter, MD;  Location: [facility];  Service: Orthopedics;  Laterality: Left;  . TOTAL SHOULDER ARTHROPLASTY Right 06/18/2014   DR SUPPLE  . TOTAL SHOULDER ARTHROPLASTY Right 06/18/2014   Procedure: RIGHT TOTAL SHOULDER ARTHROPLASTY;  Surgeon: Marin Shutter, MD;  Location: [facility];  Service: Orthopedics;  Laterality: Right;    SOCIAL HISTORY: Social History   Tobacco Use  . Smoking status: Never Smoker  . Smokeless tobacco: Never Used  Substance Use Topics  . Alcohol use: No    Alcohol/week: 0.0 oz  . Drug use: No    FAMILY HISTORY: Family History  Problem Relation Age of Onset  . COPD Father        family hx  . Emphysema Father   . Aneurysm Mother   . Stroke Mother   . Sudden death Mother   . Cancer Mother   . Obesity Mother   . Cancer Maternal  Grandfather        lung    ROS: Review of Systems  Constitutional: Negative for weight loss.  Cardiovascular: Negative for chest pain and claudication.  Musculoskeletal: Negative for myalgias.    PHYSICAL EXAM: Blood pressure 119/75, pulse 71, temperature 97.9 F (36.6 C), temperature source Oral, height 5\' 9"  (1.753 m), weight 214 lb (97.1 kg), SpO2 98 %. Body mass index is 31.6 kg/m. Physical Exam  Constitutional: He is oriented to person, place, and time. He appears well-developed and well-nourished.  Cardiovascular: Normal rate.  Pulmonary/Chest: Effort normal.  Musculoskeletal: Normal range of motion.  Neurological: He is oriented to person, place, and time.  Skin: Skin is warm and dry.  Psychiatric: He has a normal mood and affect. His behavior is normal.  Vitals reviewed.   RECENT LABS AND TESTS: BMET    Component Value Date/Time   NA 139 06/18/2017 1047   K 4.6 06/18/2017 1047   CL 102 06/18/2017 1047   CO2 23 06/18/2017 1047   GLUCOSE 96 06/18/2017 1047   GLUCOSE 94 07/31/2014 0920   GLUCOSE 94 08/20/2006 0830   BUN 21 06/18/2017 1047   CREATININE 1.11 06/18/2017 1047   CALCIUM 9.0 06/18/2017 1047   GFRNONAA 69 06/18/2017 1047   GFRAA 80 06/18/2017 1047   Lab Results  Component Value Date   HGBA1C 5.8 (H) 06/18/2017   Lab Results  Component Value Date   INSULIN 16.5 06/18/2017   CBC    Component Value Date/Time   WBC 5.5 06/18/2017 1047   WBC 5.7 07/31/2014 0920   RBC 4.60 06/18/2017 1047   RBC 4.53 07/31/2014 0920   HGB 15.7 06/18/2017 1047   HCT 45.0 06/18/2017 1047   PLT 313.0 07/31/2014 0920   MCV 98 (H) 06/18/2017 1047   MCH 34.1 (H) 06/18/2017 1047   MCH 31.8 06/10/2014 0955   MCHC 34.9 06/18/2017 1047   MCHC 33.4 07/31/2014 0920   RDW 14.3 06/18/2017 1047   LYMPHSABS 1.8 06/18/2017 1047   MONOABS 0.5 07/31/2014 0920   EOSABS 0.3 06/18/2017 1047   BASOSABS 0.1 06/18/2017 1047   Iron/TIBC/Ferritin/ %Sat No results found for:  IRON, TIBC, FERRITIN, IRONPCTSAT Lipid Panel     Component Value Date/Time   CHOL 229 (H) 06/18/2017 1047   TRIG 179 (H) 06/18/2017 1047   TRIG 52 08/20/2006 0830   HDL 65 06/18/2017 1047   CHOLHDL 4 07/31/2014 0920   VLDL 24.4 07/31/2014 0920   LDLCALC 128 (H) 06/18/2017 1047   LDLDIRECT 135.0 07/16/2013 0937   Hepatic Function Panel     Component Value Date/Time   PROT 7.0 06/18/2017 1047   ALBUMIN 4.3 06/18/2017 1047   AST 35 06/18/2017 1047   ALT 39 06/18/2017 1047   ALKPHOS 49 06/18/2017 1047   BILITOT 0.3 06/18/2017 1047   BILIDIR 0.1 07/31/2014 0920      Component Value Date/Time   TSH 2.920 06/18/2017 1047   TSH 1.53 07/31/2014 0920   TSH 1.85 07/16/2013 0937    ASSESSMENT AND PLAN: Class 1 obesity with serious comorbidity and body mass index (BMI) of 31.0 to 31.9 in adult, unspecified obesity type  Other hyperlipidemia  PLAN:  Hyperlipidemia [name] [name] was informed of the American Heart Association Guidelines emphasizing intensive lifestyle modifications as the first line treatment for hyperlipidemia. We discussed many lifestyle modifications today in depth, and Braheem will continue to work on decreasing fatty meats, butter and many fried foods. He will also increase vegetables and stick with lean protein in his diet and continue to work on diet, exercise, and weight loss efforts.  We spent > than 50% of the 15 minute visit on the counseling as documented in the note.  Obesity [name] [name] is currently in the action stage of change. As such, his goal is to continue with weight loss efforts He has agreed to keep a food journal with 1800 calories and 100+ grams of protein daily [name] [name] has been  instructed to work up to a goal of 150 minutes of combined cardio and strengthening exercise per week or wall pushups 20-50 per day for weight loss and overall health benefits. We discussed the following Behavioral Modification Strategies today: decreasing simple carbohydrates  and work  on meal planning and easy cooking plans We discussed obesity paradox and goal BMI range between 28-29 which is higher than his goal. [name] [name] agreed to work on increasing muscle mass to be at his healthiest weight.  [name] [name] has agreed to follow up with our clinic in 4 weeks. He was informed of the importance of frequent follow up visits to maximize his success with intensive lifestyle modifications for his multiple health conditions.   Today's visit was # 5 out of 22.  Starting weight: 230 lbs Starting date: 06/18/17 Today's weight : 214 lbs  Today's date: 09/26/2017 Total lbs lost to date: 16 (Patients must lose 7 lbs in the first 6 months to continue with counseling)   ASK: We discussed the diagnosis of obesity with [name] today and [name] [name] agreed to give [name] permission to discuss obesity behavioral modification therapy today.  ASSESS: Blayze has the diagnosis of obesity and his BMI today is 31.59 Satish is in the action stage of change   ADVISE: Chan was educated on the multiple health risks of obesity as well as the benefit of weight loss to improve his health. He was advised of the need for long term treatment and the importance of lifestyle modifications.  AGREE: Multiple dietary modification options and treatment options were discussed and  Elray agreed to keep a food journal with 1800 calories and 100+ grams of protein daily We discussed the following Behavioral Modification Strategies today: decreasing simple carbohydrates  and work on meal planning and easy cooking plans  I, Trixie Dredge, am acting as transcriptionist for Dennard Nip, MD  I have reviewed the above documentation for accuracy and completeness, and I agree with the above. -Dennard Nip, MD

## 2017-10-11 ENCOUNTER — Encounter: Payer: Self-pay | Admitting: *Deleted

## 2017-10-12 ENCOUNTER — Telehealth: Payer: Self-pay | Admitting: *Deleted

## 2017-10-12 MED ORDER — ERENUMAB-AOOE 70 MG/ML ~~LOC~~ SOAJ: 1.0000 | SUBCUTANEOUS | 0 refills | Status: DC

## 2017-10-12 NOTE — Telephone Encounter (Signed)
Patient came to office and picked up one box of 2 Aimovig injectors (70 mg / 1 mL). He verbalized appreciation. He also stated that he had been denied 3 times by his insurance. I assured that we would try to get more samples. He states the remaining alternative drugs needed to have been addressed: divalproex delayed release, propranolol, and metoprolol. He stated he had never tried those and didn't know what those were.

## 2017-10-12 NOTE — Addendum Note (Signed)
Addended by: Gildardo Griffes on: 10/12/2017 01:10 PM   Modules accepted: Orders

## 2017-10-17 DIAGNOSIS — Z23 Encounter for immunization: Secondary | ICD-10-CM | POA: Diagnosis not present

## 2017-10-17 DIAGNOSIS — D225 Melanocytic nevi of trunk: Secondary | ICD-10-CM | POA: Diagnosis not present

## 2017-10-17 DIAGNOSIS — D485 Neoplasm of uncertain behavior of skin: Secondary | ICD-10-CM | POA: Diagnosis not present

## 2017-10-17 DIAGNOSIS — C44319 Basal cell carcinoma of skin of other parts of face: Secondary | ICD-10-CM | POA: Diagnosis not present

## 2017-10-17 DIAGNOSIS — D2372 Other benign neoplasm of skin of left lower limb, including hip: Secondary | ICD-10-CM | POA: Diagnosis not present

## 2017-10-17 DIAGNOSIS — B009 Herpesviral infection, unspecified: Secondary | ICD-10-CM | POA: Diagnosis not present

## 2017-10-17 DIAGNOSIS — Z85828 Personal history of other malignant neoplasm of skin: Secondary | ICD-10-CM | POA: Diagnosis not present

## 2017-10-23 ENCOUNTER — Ambulatory Visit (AMBULATORY_SURGERY_CENTER): Payer: Self-pay | Admitting: *Deleted

## 2017-10-23 ENCOUNTER — Other Ambulatory Visit: Payer: Self-pay

## 2017-10-23 VITALS — Ht 69.0 in | Wt 218.0 lb

## 2017-10-23 DIAGNOSIS — Z1211 Encounter for screening for malignant neoplasm of colon: Secondary | ICD-10-CM

## 2017-10-23 MED ORDER — NA SULFATE-K SULFATE-MG SULF 17.5-3.13-1.6 GM/177ML PO SOLN: 1.0000 | Freq: Once | ORAL | 0 refills | Status: AC

## 2017-10-23 NOTE — Progress Notes (Signed)
No egg or soy allergy known to patient  No issues with past sedation with any surgeries  or procedures, no intubation problems  No diet pills per patient No home 02 use per patient  No blood thinners per patient  Pt denies issues with constipation  No A fib or A flutter  EMMI video sent to pt's e mail  

## 2017-10-24 ENCOUNTER — Encounter: Payer: Self-pay | Admitting: Gastroenterology

## 2017-10-24 ENCOUNTER — Encounter (INDEPENDENT_AMBULATORY_CARE_PROVIDER_SITE_OTHER): Payer: Self-pay | Admitting: Family Medicine

## 2017-10-24 ENCOUNTER — Ambulatory Visit (INDEPENDENT_AMBULATORY_CARE_PROVIDER_SITE_OTHER): Payer: Medicare HMO | Admitting: Family Medicine

## 2017-10-24 VITALS — BP 114/73 | HR 70 | Temp 98.2°F | Ht 69.0 in | Wt 213.0 lb

## 2017-10-24 DIAGNOSIS — Z6831 Body mass index (BMI) 31.0-31.9, adult: Secondary | ICD-10-CM | POA: Diagnosis not present

## 2017-10-24 DIAGNOSIS — E669 Obesity, unspecified: Secondary | ICD-10-CM

## 2017-10-24 DIAGNOSIS — E8881 Metabolic syndrome: Secondary | ICD-10-CM

## 2017-10-24 NOTE — Progress Notes (Signed)
Office: [phone]  /  Fax: [phone]   HPI:   Chief Complaint: OBESITY [name] [name] is here to discuss his progress with his obesity treatment plan. He is on the  keep a food journal with 1800 calories and 100+ grams of protein daily and is following his eating plan approximately 75 % of the time. He states he is walking 60 minutes 3 times per week. [name] [name] ate 100 % of his meal plan, but says he was sick over the past month. He has colonoscopy upcoming. [name] [name]'s maintenance calorie requirement is 2500. His weight is 213 lb (96.6 kg) today and has had a weight loss of 1 pound over a period of 4 weeks since his last visit. He has lost 17 lbs since starting treatment with [name].  Insulin Resistance [name] [name] has a diagnosis of insulin resistance based on his elevated fasting insulin level >5. His last insulin level was 16 in Sept 2018.  Although [name] [name]'s blood glucose readings are still under good control, insulin resistance puts him at greater risk of metabolic syndrome and diabetes. [name] [name] admits to having polyphagia of simple carbohydrates. He is not taking metformin currently and continues to work on diet and exercise to decrease risk of diabetes.  ALLERGIES: Allergies  Allergen Reactions  . Shrimp [Shellfish Allergy] Anaphylaxis and Rash  . Codeine Rash and Other (See Comments)    Blacked out  . Contrast Media [Iodinated Diagnostic Agents] Rash  . Diazepam Rash    Rash over whole body from neck down, and he lost his voice (airway compromise?)  . Amoxicillin Other (See Comments)    Unknown   . Benadryl [Diphenhydramine]     With dyes   . Depakote [Divalproex Sodium] Swelling    Per pts report (given by Dr. Inda Merlin).  Has throat swelling and rash.  . Erythromycin Other (See Comments)    unknown  . Flexeril [Cyclobenzaprine]   . Betadine [Povidone Iodine] Rash  . Celebrex [Celecoxib] Rash  . Meperidine And Related Rash  . Methocarbamol Rash  . Neosporin [Neomycin-Bacitracin Zn-Polymyx]  Rash  . Oxycodone Rash  . Penicillins Rash  . Plavix [Clopidogrel] Rash  . Rivaroxaban Rash  . Vioxx [Rofecoxib] Rash    MEDICATIONS: Current Outpatient Medications on File Prior to Visit  Medication Sig Dispense Refill  . allopurinol (ZYLOPRIM) 100 MG tablet TAKE 1 TABLET EVERY DAY 90 tablet 3  . amitriptyline (ELAVIL) 100 MG tablet Take 1 tablet (100 mg total) by mouth at bedtime. 90 tablet 3  . amitriptyline (ELAVIL) 25 MG tablet Take 1 tablet (25 mg total) by mouth at bedtime as needed for sleep. 90 tablet 3  . aspirin 81 MG tablet Take 81 mg by mouth daily.    . Cholecalciferol (VITAMIN D PO) Take 1 tablet by mouth daily.    . Coenzyme Q10 (CO Q 10 PO) Take by mouth daily.    . Cyanocobalamin (VITAMIN B 12 PO) Take by mouth.    . DULoxetine (CYMBALTA) 60 MG capsule Take 1 capsule (60 mg total) by mouth daily. 30 capsule 12  . EPINEPHRINE 0.3 mg/0.3 mL IJ SOAJ injection INJECT 0.3 MLS INTO THE MUSCLE ONCE 2 mL 1  . Erenumab-aooe (AIMOVIG) 70 MG/ML SOAJ Inject 70 mg into the skin every 30 (thirty) days. 1 pen 11  . Erenumab-aooe (AIMOVIG) 70 MG/ML SOAJ Inject 1 Syringe into the skin every 30 (thirty) days. 2 pen 0  . Gabapentin, Once-Daily, (GRALISE) 300 MG TABS Take 900 mg by mouth every evening. 180 tablet  11  . Multiple Vitamin (MULTIVITAMIN) tablet Take 1 tablet by mouth daily.    . Omega-3 Fatty Acids (FISH OIL) 1000 MG CAPS Take 1 capsule by mouth daily.    . pravastatin (PRAVACHOL) 40 MG tablet TAKE 1 TABLET EVERY DAY 90 tablet 3  . Psyllium (METAMUCIL PO) Take 2 tablets by mouth 2 (two) times daily.    . Red Yeast Rice Extract (RED YEAST RICE PO) Take 1 tablet by mouth daily.    . rizatriptan (MAXALT) 10 MG tablet Take 1 tablet (10 mg total) by mouth as needed for migraine. May repeat in 2 hours if needed 30 tablet 4  . SUMAtriptan (IMITREX) 100 MG tablet TAKE 1 TABLET EVERY 2 HOURS AS NEEDED FOR MIGRAINE OR HEADACHE 9 tablet 5  . tiZANidine (ZANAFLEX) 4 MG capsule Take 4 mg  by mouth 3 (three) times daily as needed.     . TURMERIC CURCUMIN PO Take 1,050 mg by mouth daily.    . valACYclovir (VALTREX) 500 MG tablet Take 500 mg by mouth daily.    . vitamin C (ASCORBIC ACID) 500 MG tablet Take 500 mg by mouth daily.    . vitamin E 1000 UNIT capsule Take 1,000 Units by mouth daily.    Marland Kitchen zolpidem (AMBIEN) 10 MG tablet Take 1 tablet (10 mg total) by mouth at bedtime as needed for sleep. 30 tablet 2   No current facility-administered medications on file prior to visit.     PAST MEDICAL HISTORY: Past Medical History:  Diagnosis Date  . ALLERGIC RHINITIS 04/22/2007  . Allergy   . Cancer (HCC)    HX SKIN CANCER  . Complication of anesthesia    more concerned about positioning of head & neck because of TMJ  . DDD (degenerative disc disease), cervical   . GERD (gastroesophageal reflux disease)    infrequently  . Headache(784.0)    migraines  . History of kidney stones   . History of skin cancer   . History of TMJ disorder   . HYPERCHOLESTEROLEMIA 04/22/2007  . HYPERLIPIDEMIA 07/29/2007  . Hyperplastic colon polyp   . NEPHROLITHIASIS, HX OF 07/29/2007  . Osteoarth NOS-Unspec 04/22/2007  . Shortness of breath   . Shoulder pain, acute    bilateral    PAST SURGICAL HISTORY: Past Surgical History:  Procedure Laterality Date  . APPENDECTOMY    . BACK SURGERY  10/07/2015  . COLONOSCOPY     10 yrs ago- 2 HPP   . JOINT REPLACEMENT    . KNEE ARTHROSCOPY     right x2  . SHOULDER SURGERY     right  . TOTAL KNEE ARTHROPLASTY Right 08/25/2013   Procedure: RIGHT TOTAL KNEE ARTHROPLASTY;  Surgeon: Gearlean Alf, MD;  Location: [facility];  Service: Orthopedics;  Laterality: Right;  . TOTAL SHOULDER ARTHROPLASTY Left 12/25/2013   DR SUPPLE   . TOTAL SHOULDER ARTHROPLASTY Left 12/25/2013   Procedure: LEFT TOTAL SHOULDER ARTHROPLASTY;  Surgeon: Marin Shutter, MD;  Location: [place];  Service: Orthopedics;  Laterality: Left;  . TOTAL SHOULDER ARTHROPLASTY Right 06/18/2014    DR SUPPLE  . TOTAL SHOULDER ARTHROPLASTY Right 06/18/2014   Procedure: RIGHT TOTAL SHOULDER ARTHROPLASTY;  Surgeon: Marin Shutter, MD;  Location: [place];  Service: Orthopedics;  Laterality: Right;  . WISDOM TOOTH EXTRACTION      SOCIAL HISTORY: Social History   Tobacco Use  . Smoking status: Never Smoker  . Smokeless tobacco: Never Used  Substance Use Topics  . Alcohol  use: No    Alcohol/week: 0.0 oz  . Drug use: No    FAMILY HISTORY: Family History  Problem Relation Age of Onset  . COPD Father        family hx  . Emphysema Father   . Aneurysm Mother   . Stroke Mother   . Sudden death Mother   . Cancer Mother   . Obesity Mother   . Breast cancer Mother   . Cancer Maternal Grandfather        lung  . Colon cancer Neg Hx   . Colon polyps Neg Hx     ROS: Review of Systems  Constitutional: Positive for weight loss.  Endo/Heme/Allergies:       Positive polyphagia    PHYSICAL EXAM: Blood pressure 114/73, pulse 70, temperature 98.2 F (36.8 C), temperature source Oral, height 5\' 9"  (1.753 m), weight 213 lb (96.6 kg), SpO2 97 %. Body mass index is 31.45 kg/m. Physical Exam  Constitutional: He is oriented to person, place, and time. He appears well-developed and well-nourished.  HENT:  Head: Normocephalic.  Eyes: EOM are normal.  Neck: Normal range of motion.  Cardiovascular: Normal rate.  Pulmonary/Chest: Effort normal.  Musculoskeletal: Normal range of motion.  Neurological: He is alert and oriented to person, place, and time.  Skin: Skin is warm and dry.  Psychiatric: He has a normal mood and affect. His behavior is normal.  Vitals reviewed.   RECENT LABS AND TESTS: BMET    Component Value Date/Time   NA 139 06/18/2017 1047   K 4.6 06/18/2017 1047   CL 102 06/18/2017 1047   CO2 23 06/18/2017 1047   GLUCOSE 96 06/18/2017 1047   GLUCOSE 94 07/31/2014 0920   GLUCOSE 94 08/20/2006 0830   BUN 21 06/18/2017 1047   CREATININE 1.11 06/18/2017 1047   CALCIUM  9.0 06/18/2017 1047   GFRNONAA 69 06/18/2017 1047   GFRAA 80 06/18/2017 1047   Lab Results  Component Value Date   HGBA1C 5.8 (H) 06/18/2017   Lab Results  Component Value Date   INSULIN 16.5 06/18/2017   CBC    Component Value Date/Time   WBC 5.5 06/18/2017 1047   WBC 5.7 07/31/2014 0920   RBC 4.60 06/18/2017 1047   RBC 4.53 07/31/2014 0920   HGB 15.7 06/18/2017 1047   HCT 45.0 06/18/2017 1047   PLT 313.0 07/31/2014 0920   MCV 98 (H) 06/18/2017 1047   MCH 34.1 (H) 06/18/2017 1047   MCH 31.8 06/10/2014 0955   MCHC 34.9 06/18/2017 1047   MCHC 33.4 07/31/2014 0920   RDW 14.3 06/18/2017 1047   LYMPHSABS 1.8 06/18/2017 1047   MONOABS 0.5 07/31/2014 0920   EOSABS 0.3 06/18/2017 1047   BASOSABS 0.1 06/18/2017 1047   Iron/TIBC/Ferritin/ %Sat No results found for: IRON, TIBC, FERRITIN, IRONPCTSAT Lipid Panel     Component Value Date/Time   CHOL 229 (H) 06/18/2017 1047   TRIG 179 (H) 06/18/2017 1047   TRIG 52 08/20/2006 0830   HDL 65 06/18/2017 1047   CHOLHDL 4 07/31/2014 0920   VLDL 24.4 07/31/2014 0920   LDLCALC 128 (H) 06/18/2017 1047   LDLDIRECT 135.0 07/16/2013 0937   Hepatic Function Panel     Component Value Date/Time   PROT 7.0 06/18/2017 1047   ALBUMIN 4.3 06/18/2017 1047   AST 35 06/18/2017 1047   ALT 39 06/18/2017 1047   ALKPHOS 49 06/18/2017 1047   BILITOT 0.3 06/18/2017 1047   BILIDIR 0.1 07/31/2014 0920  Component Value Date/Time   TSH 2.920 06/18/2017 1047   TSH 1.53 07/31/2014 0920   TSH 1.85 07/16/2013 9373    ASSESSMENT AND PLAN: Insulin resistance  Class 1 obesity with serious comorbidity and body mass index (BMI) of 31.0 to 31.9 in adult, unspecified obesity type  PLAN:  Insulin Resistance [name] [name] will continue to work on weight loss, exercise, and decreasing simple carbohydrates in his diet to help decrease the risk of diabetes. We discussed decreasing his intake of [place] bars. We dicussed metformin including benefits and risks.  He was informed that eating too many simple carbohydrates or too many calories at one sitting increases the likelihood of GI side effects. Prescription was not written today. [name] [name] to follow up with [name] as directed to monitor his progress.    Obesity [name] [name] is currently in the action stage of change. As such, his goal is to continue with weight loss efforts He has [name] to keep a food journal with 2000-2250 calories and 100+ grams of  protein daily [name] [name] has been instructed to work up to a goal of 150 minutes of combined cardio and strengthening exercise per week for weight loss and overall health benefits. We discussed the following Behavioral Modification Stratagies today: decreasing simple carbohydrates  and work on meal planning and easy cooking plans   [name] [name] has [name] to follow up with our clinic in 4 weeks. He was informed of the importance of frequent follow up visits to maximize his success with intensive lifestyle modifications for his multiple health conditions.   OBESITY BEHAVIORAL INTERVENTION VISIT  Today's visit was # 8 out of 22.  Starting weight: 230 lbs   Starting date: 06/18/17 Today's weight : 213 lbs Today's date: 10/24/2017 Total lbs lost to date: 17 (Patients must lose 7 lbs in the first 6 months to continue with counseling)   ASK: We discussed the diagnosis of obesity with [name] [name] today and [name] [name] [name] to give [name] permission to discuss obesity behavioral modification therapy today.  ASSESS: [name] [name] has the diagnosis of obesity and his BMI today is 31.45 [name] [name] is in the action stage of change   ADVISE: [name] [name] was educated on the multiple health risks of obesity as well as the benefit of weight loss to improve his health. He was advised of the need for long term treatment and the importance of lifestyle modifications.  AGREE: Multiple dietary modification options and treatment options were discussed and  [name] [name] [name] to the above obesity treatment  plan.  I, Renee Ramus, am acting as transcriptionist for Dennard Nip, MD  I have reviewed the above documentation for accuracy and completeness, and I agree with the above. -Dennard Nip, MD

## 2017-11-02 ENCOUNTER — Encounter: Payer: Self-pay | Admitting: Gastroenterology

## 2017-11-02 ENCOUNTER — Other Ambulatory Visit: Payer: Self-pay

## 2017-11-02 ENCOUNTER — Ambulatory Visit (AMBULATORY_SURGERY_CENTER): Payer: Medicare HMO | Admitting: Gastroenterology

## 2017-11-02 VITALS — BP 102/52 | HR 63 | Temp 98.7°F | Resp 14 | Ht 69.0 in | Wt 213.0 lb

## 2017-11-02 DIAGNOSIS — Z1212 Encounter for screening for malignant neoplasm of rectum: Secondary | ICD-10-CM | POA: Diagnosis not present

## 2017-11-02 DIAGNOSIS — Z1211 Encounter for screening for malignant neoplasm of colon: Secondary | ICD-10-CM | POA: Diagnosis present

## 2017-11-02 DIAGNOSIS — E785 Hyperlipidemia, unspecified: Secondary | ICD-10-CM | POA: Diagnosis not present

## 2017-11-02 DIAGNOSIS — K219 Gastro-esophageal reflux disease without esophagitis: Secondary | ICD-10-CM | POA: Diagnosis not present

## 2017-11-02 MED ORDER — SODIUM CHLORIDE 0.9 % IV SOLN: 500.0000 mL | Freq: Once | INTRAVENOUS | Status: DC

## 2017-11-02 NOTE — Progress Notes (Signed)
  [facility] Post-op Note  Patient: [name] [name]  Procedure(s) Performed: colonoscopy  Patient Location: [facility] - Recovery Area  Anesthesia Type: Deep Sedation/Propofol  Level of Consciousness: awake, oriented and patient cooperative  Airway and Oxygen Therapy: Patient Spontanous Breathing  Post-op Pain: none  Post-op Assessment:  Post-op Vital signs reviewed, Patient's Cardiovascular Status Stable, Respiratory Function Stable, Patent Airway, No signs of Nausea or vomiting and Pain level controlled  Post-op Vital Signs: Reviewed and stable  Complications: No apparent anesthesia complications  Kanin Lia E Doris Mcgilvery 9:19 AM

## 2017-11-02 NOTE — Patient Instructions (Signed)
Handout given :Hemorrhoids and Diverticulosis.   YOU HAD AN ENDOSCOPIC PROCEDURE TODAY AT [facility]:   Refer to the procedure report that was given to you for any specific questions about what was found during the examination.  If the procedure report does not answer your questions, please call your gastroenterologist to clarify.  If you requested that your care partner not be given the details of your procedure findings, then the procedure report has been included in a sealed envelope for you to review at your convenience later.  YOU SHOULD EXPECT: Some feelings of bloating in the abdomen. Passage of more gas than usual.  Walking can help get rid of the air that was put into your GI tract during the procedure and reduce the bloating. If you had a lower endoscopy (such as a colonoscopy or flexible sigmoidoscopy) you may notice spotting of blood in your stool or on the toilet paper. If you underwent a bowel prep for your procedure, you may not have a normal bowel movement for a few days.  Please Note:  You might notice some irritation and congestion in your nose or some drainage.  This is from the oxygen used during your procedure.  There is no need for concern and it should clear up in a day or so.  SYMPTOMS TO REPORT IMMEDIATELY:   Following lower endoscopy (colonoscopy or flexible sigmoidoscopy):  Excessive amounts of blood in the stool  Significant tenderness or worsening of abdominal pains  Swelling of the abdomen that is new, acute  Fever of 100F or higher   For urgent or emergent issues, a gastroenterologist can be reached at any hour by calling [phone].   DIET:  We do recommend a small meal at first, but then you may proceed to your regular diet.  Drink plenty of fluids but you should avoid alcoholic beverages for 24 hours.  ACTIVITY:  You should plan to take it easy for the rest of today and you should NOT DRIVE or use heavy machinery until tomorrow (because  of the sedation medicines used during the test).    FOLLOW UP: Our staff will call the number listed on your records the next business day following your procedure to check on you and address any questions or concerns that you may have regarding the information given to you following your procedure. If we do not reach you, we will leave a message.  However, if you are feeling well and you are not experiencing any problems, there is no need to return our call.  We will assume that you have returned to your regular daily activities without incident.  If any biopsies were taken you will be contacted by phone or by letter within the next 1-3 weeks.  Please call us at [phone] if you have not heard about the biopsies in 3 weeks.    SIGNATURES/CONFIDENTIALITY: You and/or your care partner have signed paperwork which will be entered into your electronic medical record.  These signatures attest to the fact that that the information above on your After Visit Summary has been reviewed and is understood.  Full responsibility of the confidentiality of this discharge information lies with you and/or your care-partner.

## 2017-11-02 NOTE — Op Note (Signed)
[facility] Patient Name: [name] [name] Procedure Date: 11/02/2017 8:44 AM MRN: [id number] Endoscopist: Ladene Artist , MD Age: 67 Referring MD:  Date of Birth: 1950-11-06 Gender: Male Account #: [id number] Procedure:                Colonoscopy Indications:              Screening for colorectal malignant neoplasm Medicines:                Monitored Anesthesia Care Procedure:                Pre-Anesthesia Assessment:                           - Prior to the procedure, a History and Physical                            was performed, and patient medications and                            allergies were reviewed. The patient's tolerance of                            previous anesthesia was also reviewed. The risks                            and benefits of the procedure and the sedation                            options and risks were discussed with the patient.                            All questions were answered, and informed consent                            was obtained. Prior Anticoagulants: The patient has                            taken no previous anticoagulant or antiplatelet                            agents. ASA Grade Assessment: II - A patient with                            mild systemic disease. After reviewing the risks                            and benefits, the patient was deemed in                            satisfactory condition to undergo the procedure.                           After obtaining informed consent, the colonoscope  was passed under direct vision. Throughout the                            procedure, the patient's blood pressure, pulse, and                            oxygen saturations were monitored continuously. The                            Colonoscope was introduced through the anus and                            advanced to the the cecum, identified by                            appendiceal orifice and  ileocecal valve. The                            ileocecal valve, appendiceal orifice, and rectum                            were photographed. The quality of the bowel                            preparation was adequate after extensive lavage and                            suctioning. The colonoscopy was performed without                            difficulty. The patient tolerated the procedure                            well. Scope In: 8:53:13 AM Scope Out: 9:11:48 AM Scope Withdrawal Time: 0 hours 13 minutes 56 seconds  Total Procedure Duration: 0 hours 18 minutes 35 seconds  Findings:                 The perianal and digital rectal examinations were                            normal.                           Internal hemorrhoids were found during                            retroflexion. The hemorrhoids were moderate and                            Grade I (internal hemorrhoids that do not prolapse).                           A few small-mouthed diverticula were found in the  left colon.                           The exam was otherwise without abnormality on                            direct and retroflexion views. Complications:            No immediate complications. Estimated blood loss:                            None. Estimated Blood Loss:     Estimated blood loss: none. Impression:               - Internal hemorrhoids.                           - Diverticulosis in the left colon.                           - The examination was otherwise normal on direct                            and retroflexion views.                           - No specimens collected. Recommendation:           - Repeat colonoscopy in 10 years for screening                            purposes with a more extensive bowel prep.                           - Patient has a contact number available for                            emergencies. The signs and symptoms of potential                             delayed complications were discussed with the                            patient. Return to normal activities tomorrow.                            Written discharge instructions were provided to the                            patient.                           - Resume previous diet.                           - Continue present medications. Ladene Artist, MD 11/02/2017 9:18:58 AM This report has been signed electronically.

## 2017-11-02 NOTE — Progress Notes (Signed)
Pt's states no medical or surgical changes since previsit or office visit. 

## 2017-11-03 ENCOUNTER — Encounter: Payer: Self-pay | Admitting: Neurology

## 2017-11-03 ENCOUNTER — Other Ambulatory Visit: Payer: Self-pay | Admitting: Neurology

## 2017-11-03 DIAGNOSIS — G43711 Chronic migraine without aura, intractable, with status migrainosus: Secondary | ICD-10-CM

## 2017-11-05 ENCOUNTER — Telehealth: Payer: Self-pay

## 2017-11-05 NOTE — Telephone Encounter (Signed)
  Follow up Call-  Call back number 11/02/2017  Post procedure Call Back phone  # [phone]  Permission to leave phone message Yes  Some recent data might be hidden     Patient questions:  Do you have a fever, pain , or abdominal swelling? No. Pain Score  0 *  Have you tolerated food without any problems? Yes.    Have you been able to return to your normal activities? Yes.    Do you have any questions about your discharge instructions: Diet   No. Medications  No. Follow up visit  No.  Do you have questions or concerns about your Care? No.  Actions: * If pain score is 4 or above: No action needed, pain <4.

## 2017-11-19 ENCOUNTER — Encounter (INDEPENDENT_AMBULATORY_CARE_PROVIDER_SITE_OTHER): Payer: Self-pay | Admitting: Family Medicine

## 2017-11-20 ENCOUNTER — Ambulatory Visit: Payer: Medicare HMO | Admitting: Neurology

## 2017-11-20 ENCOUNTER — Encounter: Payer: Self-pay | Admitting: Neurology

## 2017-11-20 VITALS — BP 123/80 | HR 67 | Ht 69.0 in | Wt 220.0 lb

## 2017-11-20 DIAGNOSIS — G43711 Chronic migraine without aura, intractable, with status migrainosus: Secondary | ICD-10-CM

## 2017-11-20 MED ORDER — ERENUMAB-AOOE 70 MG/ML ~~LOC~~ SOAJ: 70.0000 mg | SUBCUTANEOUS | 0 refills | Status: DC

## 2017-11-20 MED ORDER — GABAPENTIN (ONCE-DAILY) 300 MG PO TABS: 600.0000 mg | ORAL_TABLET | Freq: Every evening | ORAL | 0 refills | Status: DC

## 2017-11-20 NOTE — Progress Notes (Signed)
[facility]    Provider:  Dr Jaynee Eagles Referring Provider: Marletta Lor, MD Primary Care Physician:  Marletta Lor, MD  CC:  Chronic Migraines  Interval history 11/20/2017: Since starting Gralise and aimovig he is doing extremely well. In January had 2 migraines, in February had 4 days of migraines but otherwise none. The migraines are not as long and not as intense. Reduced intensity. We were having a lot of difficulty getting Aimovig approved, he spent hours on the phone. Gave him samples today. He is doing spectacularly on Aimovig. He takes Tizanidine and Amitriptyline at bedtime. He takes Gralise with food. He [facility], he lost 20 pounds. He has been walking, walking everyday. Continue one injection.   Interval history 07/23/2017:  DERAK SCHURMAN is a 67 y.o. male here as a referral from Dr. Burnice Logan for Migraines. They are improved on Amitriptyline, Gralise and Aimovig. The intensity and duration are improved as well as the frequency. Also helping him sleep. However making him more tired during the early part of the day. He has a "lot of pain" in other places around his body, pain in his right thumb and index finger, he has had multiple orthopaedic operations. They moved into a new house. He has back pain, knee pain and thumb pain. He carried a lot of boxes during the move which exacerbated his symptoms. He feels like his weight is not improving, discussed these medications have weight gain.  He saw [name] a month ago, Reviewed his migraine diary in detail, talked in length about his migraines which have decreased in frequency by 48% and 36.7% in frequency however his migraine severity has reduced by 50% as did it.   Interval history 04/26/2017: patient is following for nocturnal and morning headaches. Has been diagnosed with sleep apnea but refuses CPAP machine and despite discussions that this could be the  cause of his headaches. These tried multiple medications as below, we've tried treatments for migraines as well as possibly hypnic headaches. We've tried the new CGRP monoclonal antibody Aimovig. He has had a migraine every day for the last 2 weeks. Happening at night about 1/2 the time and now more during the day associated with back pain. He is taking melatonin, amitriptyline, cymbalta and flexeril at night. Recommend seeing Dr. Rexene Alberts to discuss sleep study in detail which showed sleep apnea.   Interval history 01/04/2017:Patient following up for morning headaches. MRI brain was normal. Patient has OSA diagnosed here at [facility] but he declined to be treated despite headaches waking him up at night. I have highly encouraged him to use a cpap as this may resolve his nocturnal and morning headaches and also warned of the sequelae of untreated sleep apnea such as increased risk of stroke. He takes tylenol PM every night, cymbalta, He is on Amitriptyline 75mg  at night. He has been to PT for his musculoskeletal neck pain.   Tried: Amitriptyline, Topamax, Cymbalta, Rizatriptan, Aimovig, Melatonin, Coffe before bed (possibly hypnic headaches),   Has not tried: Depakote, propranolol, Indomethacin,   EYC:XKGYJ [name] a 67 y.o.malehere as a referral from Dr. Raynelle Chary migraines. PMHx migraines. He has had migraines/headachesfor 10 years. He travels a lot and has significant osteoarthritis. He has had shoulder replacements and back fusion of l4-l5. His skeletal issues are a migraine trigger. All his migraines are in the middle of the night, he wakes up with headaches 95% of the time. He has seen a lot  of doctors. Sumatriptan helps with the migraines. He has been to many doctors. He saw Dr. Melton Alar for headaches. He has daily headaches. He was taking Sumatriptan daily but now only 9 times a month and no medication overuse. Headaches start in the neck, feels like he is going to explode, all over the  head, so severe he can't even look at things, painful pressure all over the head, in his eyes behind his eyes, stiffness and tense, his head is pounding, nausea, extreme light and sound sensitivity. He snores, he doesn't wake up from snoring. No aura. He will also get migraines during the day if he is not conscious of his posture. Migraines last 8 hours. He is terrified to go to bed at night because he wakes up with headaches. No vision changes, speech changes, new weakness or sensory changes. No other focal neurologic deficits, associated symptoms, inciting events or modifiable factors.  Tried: Topiramate, Elavil, acetaminophen, imitrex   Reviewed notes, labs and imaging from outside physicians, which showed:  Reviewed primary care notes and notes from [facility]. Patient has a history of migraines. Migraines have been refractory. These responded nicely to sumatriptan but his insurance company has switched to an alternate triptan and he is quite limited in his monthly supply. He is under considerable stress recently. He tried Topamax which she did not tolerate well and recently discontinued. He is previous s/p posterior lumbar interbody fusion at L4-L5 performed on 10-07-15 with noted degenerative changes at L3-L4 with collapse to the right and noted degenerative scoliosis. He has right low back pain and right hip pain that is constant and it hurts in any position. He denies any radicular pain or numbness or weakness. He has a progressive collapse of the disc space at L3-4 that is eccentric and now accentuating the lateral listhesis at L2-3. The MR shows obvious endplate inflammation as well so they will start with ESI efforts and see if this helps.If not, he will need an extension of his fusion with correciton of the deformity up to L2.Marland Kitchen  MRI LUMBAR SPINE WITHOUT CONTRAST, 08/16/2016 10:51 AM   INDICATION: LOW BACK PAIN, >6 WKS / RED FLAG(S) / RADICULOPATHY \ \ Z98.1 S/P lumbar fusion  \ M54.5 Spine pain, lumbar   COMPARISON: Lumbar spine radiograph 07/18/2016 and CT myelogram 07/12/2015   TECHNIQUE: Multiplanar, multi-sequence surface-coil MR imaging of the lumbar spine was performed without contrast.   LEVELS IMAGED: Lower thoracic to upper sacral region.   FINDINGS:  Alignment: Similar grade 1 anterolisthesis of L4 and L5. Trace retrolisthesis of L2 on L3, similar to prior.Similar levoscoliotic curvature of the spine. Vertebrae: There are fatty endplate degenerative changes at L4-L5. Increased T2/STIR signal within the endplates at P5-T6 and the inferior endplate of L2, compatible with edema/inflammation, likely degenerative in etiology. Redemonstrated postsurgical changes of posterior lumbar interbody fusion at L4-L5 with pedicle screw and rod internal fixation. Paraspinal soft tissues: Increased T2/STIR signal within the paraspinal musculature adjacent to the operative site, which is favored to reflect postsurgical changes. Conus: Normal in signal and position, terminating at the superior aspect of L2  T12-L1: No significant focal abnormality. L1-L2: No significant focal abnormality. L2-L3: Similar trace retrolisthesis of L2 on L3 with slight uncovering of the disc. There is a left eccentric disc bulge with disc desiccation and height loss. Facet hypertrophy with ligamentum flavum thickening and facet effusions bilaterally. There is mild bilateral foraminal narrowing L3-L4: There is marked narrowing of the right aspect of the disc space  at L3-4 with surrounding high STIR, low T1 signal compatible with edema, likely related to progressive degenerative changes at this level. Broad-based disc bulge with facet hypertrophy and ligamentum flavum thickening. There is moderate right lateral recess narrowing. There is moderate right foraminal narrowing and mild left foraminal narrowing. L4-L5: Grade one anterolisthesis of L4 and L5, with slight uncovering of the disc.  There is facet hypertrophy and ligamentum flavum thickening without canal narrowing. There is soft tissue extending into the foramen on the right at L4-L5.  L5-S1: Left eccentric disc bulge. There is moderate left foraminal narrowing. No significant canal narrowing.    Review of Systems: Patient complains of symptoms per HPI as well as the following symptoms:No CP, no SOB. Pertinent negatives per HPI. All others negative.    Social History   Socioeconomic History  . Marital status: Married    Spouse name: Not on file  . Number of children: 1  . Years of education: 16+  . Highest education level: Bachelor's degree (e.g., BA, AB, BS)  Social Needs  . Financial resource strain: Not on file  . Food insecurity - worry: Not on file  . Food insecurity - inability: Not on file  . Transportation needs - medical: Not on file  . Transportation needs - non-medical: Not on file  Occupational History  . Occupation: Retired  Tobacco Use  . Smoking status: Never Smoker  . Smokeless tobacco: Never Used  Substance and Sexual Activity  . Alcohol use: No    Alcohol/week: 0.0 oz  . Drug use: No  . Sexual activity: Yes    Partners: Female    Birth control/protection: Implant  Other Topics Concern  . Not on file  Social History Narrative   Lives at home w/ his wife   Right-handed   Caffeine: 2 cups of coffee each morning    Family History  Problem Relation Age of Onset  . COPD Father        family hx  . Emphysema Father   . Aneurysm Mother   . Stroke Mother   . Sudden death Mother   . Cancer Mother   . Obesity Mother   . Breast cancer Mother   . Cancer Maternal Grandfather        lung  . Colon cancer Neg Hx   . Colon polyps Neg Hx     Past Medical History:  Diagnosis Date  . ALLERGIC RHINITIS 04/22/2007  . Allergy   . Cancer (HCC)    HX SKIN CANCER  . Complication of anesthesia    more concerned about positioning of head & neck because of TMJ  . DDD (degenerative disc  disease), cervical   . GERD (gastroesophageal reflux disease)    infrequently  . Headache(784.0)    migraines  . History of kidney stones   . History of skin cancer   . History of TMJ disorder   . HYPERCHOLESTEROLEMIA 04/22/2007  . HYPERLIPIDEMIA 07/29/2007  . Hyperplastic colon polyp   . NEPHROLITHIASIS, HX OF 07/29/2007  . Osteoarth NOS-Unspec 04/22/2007  . Shortness of breath   . Shoulder pain, acute    bilateral    Past Surgical History:  Procedure Laterality Date  . APPENDECTOMY    . BACK SURGERY  10/07/2015  . COLONOSCOPY     10 yrs ago- 2 HPP   . JOINT REPLACEMENT    . KNEE ARTHROSCOPY     right x2  . SHOULDER SURGERY     right  . skin  cancer biopsy     L forehead  . TOTAL KNEE ARTHROPLASTY Right 08/25/2013   Procedure: RIGHT TOTAL KNEE ARTHROPLASTY;  Surgeon: Gearlean Alf, MD;  Location: [facility];  Service: Orthopedics;  Laterality: Right;  . TOTAL SHOULDER ARTHROPLASTY Left 12/25/2013   DR SUPPLE   . TOTAL SHOULDER ARTHROPLASTY Left 12/25/2013   Procedure: LEFT TOTAL SHOULDER ARTHROPLASTY;  Surgeon: Marin Shutter, MD;  Location: [place];  Service: Orthopedics;  Laterality: Left;  . TOTAL SHOULDER ARTHROPLASTY Right 06/18/2014   DR SUPPLE  . TOTAL SHOULDER ARTHROPLASTY Right 06/18/2014   Procedure: RIGHT TOTAL SHOULDER ARTHROPLASTY;  Surgeon: Marin Shutter, MD;  Location: [place];  Service: Orthopedics;  Laterality: Right;  . WISDOM TOOTH EXTRACTION      Current Outpatient Medications  Medication Sig Dispense Refill  . allopurinol (ZYLOPRIM) 100 MG tablet TAKE 1 TABLET EVERY DAY 90 tablet 3  . amitriptyline (ELAVIL) 100 MG tablet Take 1 tablet (100 mg total) by mouth at bedtime. 90 tablet 3  . aspirin 81 MG tablet Take 81 mg by mouth daily.    . Cholecalciferol (VITAMIN D PO) Take 1 tablet by mouth daily.    . Coenzyme Q10 (CO Q 10 PO) Take by mouth daily.    . Cyanocobalamin (VITAMIN B 12 PO) Take by mouth.    . DULoxetine (CYMBALTA) 60 MG capsule TAKE 1 CAPSULE BY  MOUTH DAILY 30 capsule 9  . EPINEPHRINE 0.3 mg/0.3 mL IJ SOAJ injection INJECT 0.3 MLS INTO THE MUSCLE ONCE 2 mL 1  . Erenumab-aooe (AIMOVIG) 70 MG/ML SOAJ Inject 1 Syringe into the skin every 30 (thirty) days. 2 pen 0  . Gabapentin, Once-Daily, (GRALISE) 300 MG TABS Take 900 mg by mouth every evening. (Patient taking differently: Take 600 mg by mouth every evening. ) 180 tablet 11  . Multiple Vitamin (MULTIVITAMIN) tablet Take 1 tablet by mouth daily.    . Omega-3 Fatty Acids (FISH OIL) 1000 MG CAPS Take 1 capsule by mouth daily.    . pravastatin (PRAVACHOL) 40 MG tablet TAKE 1 TABLET EVERY DAY 90 tablet 3  . Psyllium (METAMUCIL PO) Take 2 tablets by mouth 2 (two) times daily.    . Red Yeast Rice Extract (RED YEAST RICE PO) Take 1 tablet by mouth daily.    . rizatriptan (MAXALT) 10 MG tablet Take 1 tablet (10 mg total) by mouth as needed for migraine. May repeat in 2 hours if needed 30 tablet 4  . SUMAtriptan (IMITREX) 100 MG tablet TAKE 1 TABLET EVERY 2 HOURS AS NEEDED FOR MIGRAINE OR HEADACHE 9 tablet 5  . tiZANidine (ZANAFLEX) 4 MG capsule Take 4 mg by mouth 3 (three) times daily as needed.     . TURMERIC CURCUMIN PO Take 1,050 mg by mouth daily.    . valACYclovir (VALTREX) 500 MG tablet Take 500 mg by mouth daily.    . vitamin C (ASCORBIC ACID) 500 MG tablet Take 500 mg by mouth daily.    . vitamin E 1000 UNIT capsule Take 1,000 Units by mouth daily.     Current Facility-Administered Medications  Medication Dose Route Frequency Provider Last Rate Last Dose  . 0.9 %  sodium chloride infusion  500 mL Intravenous Once Ladene Artist, MD        Allergies as of 11/20/2017 - Review Complete 11/20/2017  Allergen Reaction Noted  . Shrimp [shellfish allergy] Anaphylaxis and Rash 05/22/2012  . Codeine Rash and Other (See Comments)   . Contrast media [iodinated  diagnostic agents] Rash 11/11/2015  . Diazepam Rash 06/26/2014  . Amoxicillin Other (See Comments)   . Benadryl [diphenhydramine]   10/23/2017  . Depakote [divalproex sodium] Swelling 07/30/2017  . Erythromycin Other (See Comments)   . Flexeril [cyclobenzaprine]  10/23/2017  . Betadine [povidone iodine] Rash 05/22/2012  . Celebrex [celecoxib] Rash 05/22/2012  . Meperidine and related Rash 05/22/2012  . Methocarbamol Rash 05/22/2012  . Neosporin [neomycin-bacitracin zn-polymyx] Rash 10/23/2017  . Oxycodone Rash 10/14/2015  . Penicillins Rash   . Plavix [clopidogrel] Rash 12/11/2013  . Rivaroxaban Rash 10/20/2013  . Vioxx [rofecoxib] Rash 05/22/2012    Vitals: BP 123/80 (BP Location: Right Arm, Patient Position: Sitting)   Pulse 67   Ht 5\' 9"  (1.753 m)   Wt 220 lb (99.8 kg)   BMI 32.49 kg/m  Last Weight:  Wt Readings from Last 1 Encounters:  11/20/17 220 lb (99.8 kg)   Last Height:   Ht Readings from Last 1 Encounters:  11/20/17 5\' 9"  (1.753 m)   Physical exam: Exam: Gen: NAD, conversant, well nourised, obese, well groomed                     Eyes: Conjunctivae clear without exudates or hemorrhage  Neuro: Detailed Neurologic Exam  Speech:    Speech is normal; fluent and spontaneous with normal comprehension.  Cognition:    The patient is oriented to person, place, and time;  Cranial Nerves:    The pupils are equal, round, and reactive to light. Visual fields are full to finger confrontation. Extraocular movements are intact. Trigeminal sensation is intact and the muscles of mastication are normal. The face is symmetric. The palate elevates in the midline. Hearing intact. Voice is normal. Shoulder shrug is normal. The tongue has normal motion without fasciculations.   Posture:    Posture is normal. normal erect    Strength:    Strength is V/V in the upper and lower limbs.      Sensation: intact to LT       Assessment/Plan:66 year old with severe morning headaches, now occurring daily with migrainous features. Significantly improved on Media, had long discussion with  patient  - [facility], has lost weight and continues with good results  Previous A/P:  Currently on 4 migraine preventatives: Cymbalta, Amitriptyline, Gralise and Aimovig. Has failed Topiramate (Tried: Amitriptyline, Topamax, Cymbalta, Rizatriptan, Aimovig, Melatonin, Coffe before bed (possibly hypnic headaches), :   Morning headaches daily: Sleep study showed sleep apnea but patient declines treatment and also denies having sleep apnea regardless of the sleep test results. I did let patient know OSA can cause nocturnal or morning headaches. I reviewed the sleep evaluation with patient in detail today, advise him that at home studies often times under estimate the amount of sleep apnea and that he should really go back and talk to her sleep team regarding his study. Patient will see Dr. Rexene Alberts and we scheduled an appointment. He is not pursuing cpap at this time, does not believe this is an issue for him despite OSA on sleep test.  Migraines: Tried and failed multiple medications for his migraines. Patient will continue Erenumab today. Discussed Botox. Improved. Discussed two injections, will stay on one injection monthly and consider in the future.   Acute medication for migraines: Sumatriptan/rizatriptan. Discussed medication overuse and not to take acute medications more than 10 days a month for migraines.   MRI brain wo contrast: normal  He has been to [facility] and did not have a good experience  Sarina Ill, MD  [facility] [address] [place], [place] [address]  Phone [phone] Fax [phone]  A total of 30 minutes was spent face-to-face with this patient. Over half this time was spent on counseling patient on the chronic migraine  diagnosis and different diagnostic and therapeutic options available.

## 2017-11-21 ENCOUNTER — Ambulatory Visit (INDEPENDENT_AMBULATORY_CARE_PROVIDER_SITE_OTHER): Payer: Medicare HMO | Admitting: Family Medicine

## 2017-11-21 VITALS — BP 114/77 | HR 63 | Temp 98.2°F | Ht 69.0 in | Wt 212.0 lb

## 2017-11-21 DIAGNOSIS — Z6831 Body mass index (BMI) 31.0-31.9, adult: Secondary | ICD-10-CM | POA: Diagnosis not present

## 2017-11-21 DIAGNOSIS — E669 Obesity, unspecified: Secondary | ICD-10-CM | POA: Diagnosis not present

## 2017-11-21 DIAGNOSIS — E7849 Other hyperlipidemia: Secondary | ICD-10-CM

## 2017-11-21 DIAGNOSIS — C44319 Basal cell carcinoma of skin of other parts of face: Secondary | ICD-10-CM | POA: Diagnosis not present

## 2017-11-21 DIAGNOSIS — Z9189 Other specified personal risk factors, not elsewhere classified: Secondary | ICD-10-CM

## 2017-11-21 DIAGNOSIS — E559 Vitamin D deficiency, unspecified: Secondary | ICD-10-CM

## 2017-11-21 DIAGNOSIS — R7303 Prediabetes: Secondary | ICD-10-CM

## 2017-11-21 NOTE — Progress Notes (Signed)
Office: [phone]  /  Fax: [phone]   HPI:   Chief Complaint: OBESITY [name] [name] is here to discuss his progress with his obesity treatment plan. He is on the Category 3 plan and is following his eating plan approximately 85 % of the time. He states he is walking and bike riding for 30 minutes 3-4 times per week. [name] [name] continues to do well with weight loss but is disappointed he didn't do better. He is keeping a food journal approximately 50% of the time and doing well meeting and exceeding his protein goal. He walks >10,000 steps daily on average.  His weight is 212 lb (96.2 kg) today and has had a weight loss of 2 pounds over a period of 4 weeks since his last visit. He has lost 18 lbs since starting treatment with [name].  Pre-Diabetes [name] [name] has a diagnosis of pre-diabetes based on his elevated Hgb A1c and was informed this puts him at greater risk of developing diabetes. He is attempting to control with diet, doing well with weight loss to decrease the risk of diabetes. He is due for labs. He denies nausea or hypoglycemia.  Vitamin D Deficiency [name] [name] has a diagnosis of vitamin D deficiency. His last level at goal on Vit D OTC, due for labs. He denies nausea, vomiting or muscle weakness.  Hyperlipidemia [name] [name] has hyperlipidemia, on pravastatin and attempting to improve his cholesterol levels with intensive lifestyle modification including a low saturated fat diet, exercise and weight loss. He denies any chest pain, claudication or myalgias.  ALLERGIES: Allergies  Allergen Reactions  . Shrimp [Shellfish Allergy] Anaphylaxis and Rash  . Codeine Rash and Other (See Comments)    Blacked out  . Contrast Media [Iodinated Diagnostic Agents] Rash  . Diazepam Rash    Rash over whole body from neck down, and he lost his voice (airway compromise?)  . Amoxicillin Other (See Comments)    Unknown   . Benadryl [Diphenhydramine]     With dyes   . Depakote [Divalproex Sodium] Swelling    Per pts  report (given by Dr. Inda Merlin).  Has throat swelling and rash.  . Erythromycin Other (See Comments)    unknown  . Flexeril [Cyclobenzaprine]   . Betadine [Povidone Iodine] Rash  . Celebrex [Celecoxib] Rash  . Meperidine And Related Rash  . Methocarbamol Rash  . Neosporin [Neomycin-Bacitracin Zn-Polymyx] Rash  . Oxycodone Rash  . Penicillins Rash  . Plavix [Clopidogrel] Rash  . Rivaroxaban Rash  . Vioxx [Rofecoxib] Rash    MEDICATIONS: Current Outpatient Medications on File Prior to Visit  Medication Sig Dispense Refill  . allopurinol (ZYLOPRIM) 100 MG tablet TAKE 1 TABLET EVERY DAY 90 tablet 3  . amitriptyline (ELAVIL) 100 MG tablet Take 1 tablet (100 mg total) by mouth at bedtime. 90 tablet 3  . aspirin 81 MG tablet Take 81 mg by mouth daily.    . Cholecalciferol (VITAMIN D PO) Take 1 tablet by mouth daily.    . Coenzyme Q10 (CO Q 10 PO) Take by mouth daily.    . Cyanocobalamin (VITAMIN B 12 PO) Take by mouth.    . DULoxetine (CYMBALTA) 60 MG capsule TAKE 1 CAPSULE BY MOUTH DAILY 30 capsule 9  . EPINEPHRINE 0.3 mg/0.3 mL IJ SOAJ injection INJECT 0.3 MLS INTO THE MUSCLE ONCE 2 mL 1  . Erenumab-aooe (AIMOVIG) 70 MG/ML SOAJ Inject 1 Syringe into the skin every 30 (thirty) days. 2 pen 0  . Erenumab-aooe (AIMOVIG) 70 MG/ML SOAJ Inject 70 mg into  the skin every 30 (thirty) days. 6 pen 0  . Gabapentin, Once-Daily, (GRALISE) 300 MG TABS Take 900 mg by mouth every evening. (Patient taking differently: Take 600 mg by mouth every evening. ) 180 tablet 11  . Gabapentin, Once-Daily, (GRALISE) 300 MG TABS Take 600 mg by mouth every evening. 57 tablet 0  . Multiple Vitamin (MULTIVITAMIN) tablet Take 1 tablet by mouth daily.    . Omega-3 Fatty Acids (FISH OIL) 1000 MG CAPS Take 1 capsule by mouth daily.    . pravastatin (PRAVACHOL) 40 MG tablet TAKE 1 TABLET EVERY DAY 90 tablet 3  . Psyllium (METAMUCIL PO) Take 2 tablets by mouth 2 (two) times daily.    . Red Yeast Rice Extract (RED YEAST RICE  PO) Take 1 tablet by mouth daily.    . rizatriptan (MAXALT) 10 MG tablet Take 1 tablet (10 mg total) by mouth as needed for migraine. May repeat in 2 hours if needed 30 tablet 4  . SUMAtriptan (IMITREX) 100 MG tablet TAKE 1 TABLET EVERY 2 HOURS AS NEEDED FOR MIGRAINE OR HEADACHE 9 tablet 5  . tiZANidine (ZANAFLEX) 4 MG capsule Take 4 mg by mouth 3 (three) times daily as needed.     . TURMERIC CURCUMIN PO Take 1,050 mg by mouth daily.    . valACYclovir (VALTREX) 500 MG tablet Take 500 mg by mouth daily.    . vitamin C (ASCORBIC ACID) 500 MG tablet Take 500 mg by mouth daily.    . vitamin E 1000 UNIT capsule Take 1,000 Units by mouth daily.     Current Facility-Administered Medications on File Prior to Visit  Medication Dose Route Frequency Provider Last Rate Last Dose  . 0.9 %  sodium chloride infusion  500 mL Intravenous Once Ladene Artist, MD        PAST MEDICAL HISTORY: Past Medical History:  Diagnosis Date  . ALLERGIC RHINITIS 04/22/2007  . Allergy   . Cancer (HCC)    HX SKIN CANCER  . Complication of anesthesia    more concerned about positioning of head & neck because of TMJ  . DDD (degenerative disc disease), cervical   . GERD (gastroesophageal reflux disease)    infrequently  . Headache(784.0)    migraines  . History of kidney stones   . History of skin cancer   . History of TMJ disorder   . HYPERCHOLESTEROLEMIA 04/22/2007  . HYPERLIPIDEMIA 07/29/2007  . Hyperplastic colon polyp   . NEPHROLITHIASIS, HX OF 07/29/2007  . Osteoarth NOS-Unspec 04/22/2007  . Shortness of breath   . Shoulder pain, acute    bilateral    PAST SURGICAL HISTORY: Past Surgical History:  Procedure Laterality Date  . APPENDECTOMY    . BACK SURGERY  10/07/2015  . COLONOSCOPY     10 yrs ago- 2 HPP   . JOINT REPLACEMENT    . KNEE ARTHROSCOPY     right x2  . SHOULDER SURGERY     right  . skin cancer biopsy     L forehead  . TOTAL KNEE ARTHROPLASTY Right 08/25/2013   Procedure: RIGHT TOTAL  KNEE ARTHROPLASTY;  Surgeon: Gearlean Alf, MD;  Location: [facility];  Service: Orthopedics;  Laterality: Right;  . TOTAL SHOULDER ARTHROPLASTY Left 12/25/2013   DR SUPPLE   . TOTAL SHOULDER ARTHROPLASTY Left 12/25/2013   Procedure: LEFT TOTAL SHOULDER ARTHROPLASTY;  Surgeon: Marin Shutter, MD;  Location: [place];  Service: Orthopedics;  Laterality: Left;  . TOTAL SHOULDER ARTHROPLASTY Right 06/18/2014  DR SUPPLE  . TOTAL SHOULDER ARTHROPLASTY Right 06/18/2014   Procedure: RIGHT TOTAL SHOULDER ARTHROPLASTY;  Surgeon: Marin Shutter, MD;  Location: [place];  Service: Orthopedics;  Laterality: Right;  . WISDOM TOOTH EXTRACTION      SOCIAL HISTORY: Social History   Tobacco Use  . Smoking status: Never Smoker  . Smokeless tobacco: Never Used  Substance Use Topics  . Alcohol use: No    Alcohol/week: 0.0 oz  . Drug use: No    FAMILY HISTORY: Family History  Problem Relation Age of Onset  . COPD Father        family hx  . Emphysema Father   . Aneurysm Mother   . Stroke Mother   . Sudden death Mother   . Cancer Mother   . Obesity Mother   . Breast cancer Mother   . Cancer Maternal Grandfather        lung  . Colon cancer Neg Hx   . Colon polyps Neg Hx     ROS: Review of Systems  Constitutional: Positive for weight loss.  Cardiovascular: Negative for chest pain and claudication.  Gastrointestinal: Negative for nausea and vomiting.  Musculoskeletal: Negative for myalgias.       Negative muscle weakness  Endo/Heme/Allergies:       Negative hypoglycemia    PHYSICAL EXAM: Blood pressure 114/77, pulse 63, temperature 98.2 F (36.8 C), height 5\' 9"  (1.753 m), weight 212 lb (96.2 kg), SpO2 96 %. Body mass index is 31.31 kg/m. Physical Exam  Constitutional: He is oriented to person, place, and time. He appears well-developed and well-nourished.  Cardiovascular: Normal rate.  Pulmonary/Chest: Effort normal.  Musculoskeletal: Normal range of motion.  Neurological: He is oriented  to person, place, and time.  Skin: Skin is warm and dry.  Psychiatric: He has a normal mood and affect. His behavior is normal.  Vitals reviewed.   RECENT LABS AND TESTS: BMET    Component Value Date/Time   NA 139 06/18/2017 1047   K 4.6 06/18/2017 1047   CL 102 06/18/2017 1047   CO2 23 06/18/2017 1047   GLUCOSE 96 06/18/2017 1047   GLUCOSE 94 07/31/2014 0920   GLUCOSE 94 08/20/2006 0830   BUN 21 06/18/2017 1047   CREATININE 1.11 06/18/2017 1047   CALCIUM 9.0 06/18/2017 1047   GFRNONAA 69 06/18/2017 1047   GFRAA 80 06/18/2017 1047   Lab Results  Component Value Date   HGBA1C 5.8 (H) 06/18/2017   Lab Results  Component Value Date   INSULIN 16.5 06/18/2017   CBC    Component Value Date/Time   WBC 5.5 06/18/2017 1047   WBC 5.7 07/31/2014 0920   RBC 4.60 06/18/2017 1047   RBC 4.53 07/31/2014 0920   HGB 15.7 06/18/2017 1047   HCT 45.0 06/18/2017 1047   PLT 313.0 07/31/2014 0920   MCV 98 (H) 06/18/2017 1047   MCH 34.1 (H) 06/18/2017 1047   MCH 31.8 06/10/2014 0955   MCHC 34.9 06/18/2017 1047   MCHC 33.4 07/31/2014 0920   RDW 14.3 06/18/2017 1047   LYMPHSABS 1.8 06/18/2017 1047   MONOABS 0.5 07/31/2014 0920   EOSABS 0.3 06/18/2017 1047   BASOSABS 0.1 06/18/2017 1047   Iron/TIBC/Ferritin/ %Sat No results found for: IRON, TIBC, FERRITIN, IRONPCTSAT Lipid Panel     Component Value Date/Time   CHOL 229 (H) 06/18/2017 1047   TRIG 179 (H) 06/18/2017 1047   TRIG 52 08/20/2006 0830   HDL 65 06/18/2017 1047   CHOLHDL 4 07/31/2014  0920   VLDL 24.4 07/31/2014 0920   LDLCALC 128 (H) 06/18/2017 1047   LDLDIRECT 135.0 07/16/2013 0937   Hepatic Function Panel     Component Value Date/Time   PROT 7.0 06/18/2017 1047   ALBUMIN 4.3 06/18/2017 1047   AST 35 06/18/2017 1047   ALT 39 06/18/2017 1047   ALKPHOS 49 06/18/2017 1047   BILITOT 0.3 06/18/2017 1047   BILIDIR 0.1 07/31/2014 0920      Component Value Date/Time   TSH 2.920 06/18/2017 1047   TSH 1.53  07/31/2014 0920   TSH 1.85 07/16/2013 0937  Results for SAMYAK, SACKMANN (MRN [id number]) as of 11/21/2017 11:59  Ref. Range 06/18/2017 10:47  Vitamin D, 25-Hydroxy Latest Ref Range: 30.0 - 100.0 ng/mL 71.2    ASSESSMENT AND PLAN: Prediabetes - Plan: Comprehensive metabolic panel, Hemoglobin A1c, Insulin, random  Vitamin D deficiency - Plan: VITAMIN D 25 Hydroxy (Vit-D Deficiency, Fractures)  Other hyperlipidemia - Plan: Lipid Panel With LDL/HDL Ratio  Class 1 obesity with serious comorbidity and body mass index (BMI) of 31.0 to 31.9 in adult, unspecified obesity type  PLAN:  Pre-Diabetes [name] [name] will continue to work on weight loss, exercise, and decreasing simple carbohydrates in his diet to help decrease the risk of diabetes. We dicussed metformin including benefits and risks. He was informed that eating too many simple carbohydrates or too many calories at one sitting increases the likelihood of GI side effects. [name] [name] declined metformin for now and a prescription was not written today. We will check labs and [name] [name] agrees to follow up with our clinic in 2 to 3 weeks as directed to monitor his progress.  Vitamin D Deficiency [name] [name] was informed that low vitamin D levels contributes to fatigue and are associated with obesity, breast, and colon cancer. [name] [name] will follow up for routine testing of vitamin D, at least 2-3 times per year. He was informed of the risk of over-replacement of vitamin D and agrees to not increase his dose unless he discusses this with [name] first. We will check labs and [name] [name] agrees to follow up with our clinic in 2 to 3 weeks.  Hyperlipidemia [name] [name] was informed of the American Heart Association Guidelines emphasizing intensive lifestyle modifications as the first line treatment for hyperlipidemia. We discussed many lifestyle modifications today in depth, and [name] [name] will continue to work on decreasing saturated fats such as fatty red meat, butter and many fried foods. He will  also increase vegetables and lean protein in his diet and continue to work on diet, exercise, and weight loss efforts. [name] [name] agrees to continue pravastatin and we will check labs and [name] [name] agrees to follow up with our clinic in 2 to 3 weeks.  Obesity [name] [name] is currently in the action stage of change. As such, his goal is to continue with weight loss efforts He has agreed to follow the Category 3 plan [name] [name] has been instructed to work up to a goal of 150 minutes of combined cardio and strengthening exercise per week for weight loss and overall health benefits. We discussed the following Behavioral Modification Strategies today: increasing fiber rich foods and increase H20 intake   [name] [name] has agreed to follow up with our clinic in 2 to 3 weeks. He was informed of the importance of frequent follow up visits to maximize his success with intensive lifestyle modifications for his multiple health conditions.   OBESITY BEHAVIORAL INTERVENTION VISIT  Today's visit was # 7 out of 22.  Starting weight: 230 lbs Starting date:  06/18/17 Today's weight : 212 lbs  Today's date: 11/21/2017 Total lbs lost to date: 18 (Patients must lose 7 lbs in the first 6 months to continue with counseling)   ASK: We discussed the diagnosis of obesity with [name] [name] today and [name] [name] agreed to give [name] permission to discuss obesity behavioral modification therapy today.  ASSESS: [name] [name] has the diagnosis of obesity and his BMI today is 31.29 [name] [name] is in the action stage of change   ADVISE: [name] [name] was educated on the multiple health risks of obesity as well as the benefit of weight loss to improve his health. He was advised of the need for long term treatment and the importance of lifestyle modifications.  AGREE: Multiple dietary modification options and treatment options were discussed and  [name] [name] agreed to the above obesity treatment plan.  I, Trixie Dredge, am acting as transcriptionist for Dennard Nip, MD  I  have reviewed the above documentation for accuracy and completeness, and I agree with the above. -Dennard Nip, MD

## 2017-11-22 LAB — COMPREHENSIVE METABOLIC PANEL
ALBUMIN: 4.3 g/dL (ref 3.6–4.8)
ALK PHOS: 46 IU/L (ref 39–117)
ALT: 40 IU/L (ref 0–44)
AST: 40 IU/L (ref 0–40)
Albumin/Globulin Ratio: 1.6 (ref 1.2–2.2)
BUN / CREAT RATIO: 22 (ref 10–24)
BUN: 25 mg/dL (ref 8–27)
Bilirubin Total: 0.5 mg/dL (ref 0.0–1.2)
CHLORIDE: 103 mmol/L (ref 96–106)
CO2: 21 mmol/L (ref 20–29)
CREATININE: 1.14 mg/dL (ref 0.76–1.27)
Calcium: 9.3 mg/dL (ref 8.6–10.2)
GFR calc Af Amer: 77 mL/min/{1.73_m2} (ref >59)
GFR calc non Af Amer: 67 mL/min/{1.73_m2} (ref >59)
GLUCOSE: 93 mg/dL (ref 65–99)
Globulin, Total: 2.7 g/dL (ref 1.5–4.5)
Potassium: 4.4 mmol/L (ref 3.5–5.2)
Sodium: 140 mmol/L (ref 134–144)
Total Protein: 7 g/dL (ref 6.0–8.5)

## 2017-11-22 LAB — LIPID PANEL WITH LDL/HDL RATIO
CHOLESTEROL TOTAL: 239 mg/dL — AB (ref 100–199)
HDL: 62 mg/dL (ref >39)
LDL Calculated: 149 mg/dL — ABNORMAL HIGH (ref 0–99)
LDL/HDL RATIO: 2.4 ratio (ref 0.0–3.6)
Triglycerides: 139 mg/dL (ref 0–149)
VLDL Cholesterol Cal: 28 mg/dL (ref 5–40)

## 2017-11-22 LAB — HEMOGLOBIN A1C
ESTIMATED AVERAGE GLUCOSE: 111 mg/dL
Hgb A1c MFr Bld: 5.5 % (ref 4.8–5.6)

## 2017-11-22 LAB — INSULIN, RANDOM: INSULIN: 8.6 u[IU]/mL (ref 2.6–24.9)

## 2017-11-22 LAB — VITAMIN D 25 HYDROXY (VIT D DEFICIENCY, FRACTURES): Vit D, 25-Hydroxy: 59.6 ng/mL (ref 30.0–100.0)

## 2017-12-05 ENCOUNTER — Ambulatory Visit (INDEPENDENT_AMBULATORY_CARE_PROVIDER_SITE_OTHER): Payer: Medicare HMO | Admitting: Family Medicine

## 2017-12-05 VITALS — BP 111/74 | HR 81 | Temp 98.2°F | Ht 69.0 in | Wt 212.0 lb

## 2017-12-05 DIAGNOSIS — E669 Obesity, unspecified: Secondary | ICD-10-CM

## 2017-12-05 DIAGNOSIS — G43809 Other migraine, not intractable, without status migrainosus: Secondary | ICD-10-CM | POA: Diagnosis not present

## 2017-12-05 DIAGNOSIS — Z6831 Body mass index (BMI) 31.0-31.9, adult: Secondary | ICD-10-CM | POA: Diagnosis not present

## 2017-12-05 NOTE — Progress Notes (Signed)
Office: [phone]  /  Fax: [phone]   HPI:   Chief Complaint: OBESITY [name] [name] is here to discuss his progress with his obesity treatment plan. He is on the Category 3 plan and is following his eating plan approximately 20 % of the time. He states he is riding bicycle, cardio, and walking for 60 minutes 1-2 times per week. [name] [name] did well with maintaining weight while not being able to exercise recently. He is very frustrated with himself as he has a perfectionist personality and feels he has failed the last 2 weeks.  His weight is 212 lb (96.2 kg) today and has not lost weight since his last visit. He has lost 18 lbs since starting treatment with [name].  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 He is followed by Dr. Jaynee Eagles and was given samples of Aimovig as his insurance refuses to pay for this. He is very grateful for his samples.  ALLERGIES: Allergies  Allergen Reactions  . Shrimp [Shellfish Allergy] Anaphylaxis and Rash  . Codeine Rash and Other (See Comments)    Blacked out  . Contrast Media [Iodinated Diagnostic Agents] Rash  . Diazepam Rash    Rash over whole body from neck down, and he lost his voice (airway compromise?)  . Amoxicillin Other (See Comments)    Unknown   . Benadryl [Diphenhydramine]     With dyes   . Depakote [Divalproex Sodium] Swelling    Per pts report (given by Dr. Inda Merlin).  Has throat swelling and rash.  . Erythromycin Other (See Comments)    unknown  . Flexeril [Cyclobenzaprine]   . Betadine [Povidone Iodine] Rash  . Celebrex [Celecoxib] Rash  . Meperidine And Related Rash  . Methocarbamol Rash  . Neosporin [Neomycin-Bacitracin Zn-Polymyx] Rash  . Oxycodone Rash  . Penicillins Rash  . Plavix [Clopidogrel] Rash  . Rivaroxaban Rash  . Vioxx [Rofecoxib] Rash    MEDICATIONS: Current Outpatient Medications on File Prior to Visit  Medication Sig Dispense Refill  . allopurinol (ZYLOPRIM)  100 MG tablet TAKE 1 TABLET EVERY DAY 90 tablet 3  . amitriptyline (ELAVIL) 100 MG tablet Take 1 tablet (100 mg total) by mouth at bedtime. 90 tablet 3  . aspirin 81 MG tablet Take 81 mg by mouth daily.    . Cholecalciferol (VITAMIN D PO) Take 1 tablet by mouth daily.    . Coenzyme Q10 (CO Q 10 PO) Take by mouth daily.    . Cyanocobalamin (VITAMIN B 12 PO) Take by mouth.    . DULoxetine (CYMBALTA) 60 MG capsule TAKE 1 CAPSULE BY MOUTH DAILY 30 capsule 9  . EPINEPHRINE 0.3 mg/0.3 mL IJ SOAJ injection INJECT 0.3 MLS INTO THE MUSCLE ONCE 2 mL 1  . Erenumab-aooe (AIMOVIG) 70 MG/ML SOAJ Inject 1 Syringe into the skin every 30 (thirty) days. 2 pen 0  . Erenumab-aooe (AIMOVIG) 70 MG/ML SOAJ Inject 70 mg into the skin every 30 (thirty) days. 6 pen 0  . Gabapentin, Once-Daily, (GRALISE) 300 MG TABS Take 900 mg by mouth every evening. (Patient taking differently: Take 600 mg by mouth every evening. ) 180 tablet 11  . Gabapentin, Once-Daily, (GRALISE) 300 MG TABS Take 600 mg by mouth every evening. 57 tablet 0  . Multiple Vitamin (MULTIVITAMIN) tablet Take 1 tablet by mouth daily.    . Omega-3 Fatty Acids (FISH OIL) 1000 MG CAPS Take 1 capsule by mouth daily.    . pravastatin (PRAVACHOL) 40 MG tablet TAKE  1 TABLET EVERY DAY 90 tablet 3  . Psyllium (METAMUCIL PO) Take 2 tablets by mouth 2 (two) times daily.    . Red Yeast Rice Extract (RED YEAST RICE PO) Take 1 tablet by mouth daily.    . rizatriptan (MAXALT) 10 MG tablet Take 1 tablet (10 mg total) by mouth as needed for migraine. May repeat in 2 hours if needed 30 tablet 4  . SUMAtriptan (IMITREX) 100 MG tablet TAKE 1 TABLET EVERY 2 HOURS AS NEEDED FOR MIGRAINE OR HEADACHE 9 tablet 5  . tiZANidine (ZANAFLEX) 4 MG capsule Take 4 mg by mouth 3 (three) times daily as needed.     . TURMERIC CURCUMIN PO Take 1,050 mg by mouth daily.    . valACYclovir (VALTREX) 500 MG tablet Take 500 mg by mouth daily.    . vitamin C (ASCORBIC ACID) 500 MG tablet Take 500 mg  by mouth daily.    . vitamin E 1000 UNIT capsule Take 1,000 Units by mouth daily.     Current Facility-Administered Medications on File Prior to Visit  Medication Dose Route Frequency Provider Last Rate Last Dose  . 0.9 %  sodium chloride infusion  500 mL Intravenous Once Ladene Artist, MD        PAST MEDICAL HISTORY: Past Medical History:  Diagnosis Date  . ALLERGIC RHINITIS 04/22/2007  . Allergy   . Cancer (HCC)    HX SKIN CANCER  . Complication of anesthesia    more concerned about positioning of head & neck because of TMJ  . DDD (degenerative disc disease), cervical   . GERD (gastroesophageal reflux disease)    infrequently  . Headache(784.0)    migraines  . History of kidney stones   . History of skin cancer   . History of TMJ disorder   . HYPERCHOLESTEROLEMIA 04/22/2007  . HYPERLIPIDEMIA 07/29/2007  . Hyperplastic colon polyp   . NEPHROLITHIASIS, HX OF 07/29/2007  . Osteoarth NOS-Unspec 04/22/2007  . Shortness of breath   . Shoulder pain, acute    bilateral    PAST SURGICAL HISTORY: Past Surgical History:  Procedure Laterality Date  . APPENDECTOMY    . BACK SURGERY  10/07/2015  . COLONOSCOPY     10 yrs ago- 2 HPP   . JOINT REPLACEMENT    . KNEE ARTHROSCOPY     right x2  . SHOULDER SURGERY     right  . skin cancer biopsy     L forehead  . TOTAL KNEE ARTHROPLASTY Right 08/25/2013   Procedure: RIGHT TOTAL KNEE ARTHROPLASTY;  Surgeon: Gearlean Alf, MD;  Location: [facility];  Service: Orthopedics;  Laterality: Right;  . TOTAL SHOULDER ARTHROPLASTY Left 12/25/2013   DR SUPPLE   . TOTAL SHOULDER ARTHROPLASTY Left 12/25/2013   Procedure: LEFT TOTAL SHOULDER ARTHROPLASTY;  Surgeon: Marin Shutter, MD;  Location: [place];  Service: Orthopedics;  Laterality: Left;  . TOTAL SHOULDER ARTHROPLASTY Right 06/18/2014   DR SUPPLE  . TOTAL SHOULDER ARTHROPLASTY Right 06/18/2014   Procedure: RIGHT TOTAL SHOULDER ARTHROPLASTY;  Surgeon: Marin Shutter, MD;  Location: [place];   Service: Orthopedics;  Laterality: Right;  . WISDOM TOOTH EXTRACTION      SOCIAL HISTORY: Social History   Tobacco Use  . Smoking status: Never Smoker  . Smokeless tobacco: Never Used  Substance Use Topics  . Alcohol use: No    Alcohol/week: 0.0 oz  . Drug use: No    FAMILY HISTORY: Family History  Problem Relation Age of  Onset  . COPD Father        family hx  . Emphysema Father   . Aneurysm Mother   . Stroke Mother   . Sudden death Mother   . Cancer Mother   . Obesity Mother   . Breast cancer Mother   . Cancer Maternal Grandfather        lung  . Colon cancer Neg Hx   . Colon polyps Neg Hx     ROS: Review of Systems  Constitutional: Negative for weight loss.  Neurological: Positive for headaches.    PHYSICAL EXAM: Blood pressure 111/74, pulse 81, temperature 98.2 F (36.8 C), temperature source Oral, height 5\' 9"  (1.753 m), weight 212 lb (96.2 kg), SpO2 97 %. Body mass index is 31.31 kg/m. Physical Exam  Constitutional: He is oriented to person, place, and time. He appears well-developed and well-nourished.  Cardiovascular: Normal rate.  Pulmonary/Chest: Effort normal.  Musculoskeletal: Normal range of motion.  Neurological: He is oriented to person, place, and time.  Skin: Skin is warm and dry.  Psychiatric: He has a normal mood and affect. His behavior is normal.  Vitals reviewed.   RECENT LABS AND TESTS: BMET    Component Value Date/Time   NA 140 11/21/2017 0819   K 4.4 11/21/2017 0819   CL 103 11/21/2017 0819   CO2 21 11/21/2017 0819   GLUCOSE 93 11/21/2017 0819   GLUCOSE 94 07/31/2014 0920   GLUCOSE 94 08/20/2006 0830   BUN 25 11/21/2017 0819   CREATININE 1.14 11/21/2017 0819   CALCIUM 9.3 11/21/2017 0819   GFRNONAA 67 11/21/2017 0819   GFRAA 77 11/21/2017 0819   Lab Results  Component Value Date   HGBA1C 5.5 11/21/2017   HGBA1C 5.8 (H) 06/18/2017   Lab Results  Component Value Date   INSULIN 8.6 11/21/2017   INSULIN 16.5  06/18/2017   CBC    Component Value Date/Time   WBC 5.5 06/18/2017 1047   WBC 5.7 07/31/2014 0920   RBC 4.60 06/18/2017 1047   RBC 4.53 07/31/2014 0920   HGB 15.7 06/18/2017 1047   HCT 45.0 06/18/2017 1047   PLT 313.0 07/31/2014 0920   MCV 98 (H) 06/18/2017 1047   MCH 34.1 (H) 06/18/2017 1047   MCH 31.8 06/10/2014 0955   MCHC 34.9 06/18/2017 1047   MCHC 33.4 07/31/2014 0920   RDW 14.3 06/18/2017 1047   LYMPHSABS 1.8 06/18/2017 1047   MONOABS 0.5 07/31/2014 0920   EOSABS 0.3 06/18/2017 1047   BASOSABS 0.1 06/18/2017 1047   Iron/TIBC/Ferritin/ %Sat No results found for: IRON, TIBC, FERRITIN, IRONPCTSAT Lipid Panel     Component Value Date/Time   CHOL 239 (H) 11/21/2017 0819   TRIG 139 11/21/2017 0819   TRIG 52 08/20/2006 0830   HDL 62 11/21/2017 0819   CHOLHDL 4 07/31/2014 0920   VLDL 24.4 07/31/2014 0920   LDLCALC 149 (H) 11/21/2017 0819   LDLDIRECT 135.0 07/16/2013 0937   Hepatic Function Panel     Component Value Date/Time   PROT 7.0 11/21/2017 0819   ALBUMIN 4.3 11/21/2017 0819   AST 40 11/21/2017 0819   ALT 40 11/21/2017 0819   ALKPHOS 46 11/21/2017 0819   BILITOT 0.5 11/21/2017 0819   BILIDIR 0.1 07/31/2014 0920      Component Value Date/Time   TSH 2.920 06/18/2017 1047   TSH 1.53 07/31/2014 0920   TSH 1.85 07/16/2013 0937    ASSESSMENT AND PLAN: Other migraine without status migrainosus, not intractable  Class 1  obesity with serious comorbidity and body mass index (BMI) of 31.0 to 31.9 in adult, unspecified obesity type  PLAN:  Migraine Headaches [name] [name] was encouraged to increase H20 and continue his Aimovig as prescribed. We will continue to monitor and encourage exercise when symptoms ease. [name] [name] agrees to follow up with our clinic in 3 to 4 weeks.  We spent > than 50% of the 30 minute visit on the counseling as documented in the note.  Obesity [name] [name] is currently in the action stage of change. As such, his goal is to continue with weight loss  efforts He has agreed to follow the Category 3 plan [name] [name] has been instructed to work up to a goal of 150 minutes of combined cardio and strengthening exercise per week for weight loss and overall health benefits. We discussed the following Behavioral Modification Strategies today: increasing lean protein intake and emotional eating strategies   [name] [name] has agreed to follow up with our clinic in 3 to 4 weeks. He was informed of the importance of frequent follow up visits to maximize his success with intensive lifestyle modifications for his multiple health conditions.   OBESITY BEHAVIORAL INTERVENTION VISIT  Today's visit was # 8 out of 22.  Starting weight: 230 lbs Starting date: 06/18/17 Today's weight : 212 lbs  Today's date: 12/05/2017 Total lbs lost to date: 9 (Patients must lose 7 lbs in the first 6 months to continue with counseling)   ASK: We discussed the diagnosis of obesity with [name] today and [name] agreed to give [name] permission to discuss obesity behavioral modification therapy today.  ASSESS: Behr has the diagnosis of obesity and his BMI today is 31.29 [name] [name] is in the action stage of change   ADVISE: [name] [name] was educated on the multiple health risks of obesity as well as the benefit of weight loss to improve his health. He was advised of the need for long term treatment and the importance of lifestyle modifications.  AGREE: Multiple dietary modification options and treatment options were discussed and  [name] [name] agreed to the above obesity treatment plan.  I, Trixie Dredge, am acting as transcriptionist for Dennard Nip, MD  I have reviewed the above documentation for accuracy and completeness, and I agree with the above. -Dennard Nip, MD

## 2017-12-24 DIAGNOSIS — M79644 Pain in right finger(s): Secondary | ICD-10-CM | POA: Diagnosis not present

## 2017-12-26 ENCOUNTER — Ambulatory Visit (INDEPENDENT_AMBULATORY_CARE_PROVIDER_SITE_OTHER): Payer: Medicare HMO | Admitting: Family Medicine

## 2017-12-26 VITALS — BP 112/74 | HR 99 | Temp 97.9°F | Ht 69.0 in | Wt 209.0 lb

## 2017-12-26 DIAGNOSIS — Z6831 Body mass index (BMI) 31.0-31.9, adult: Secondary | ICD-10-CM | POA: Diagnosis not present

## 2017-12-26 DIAGNOSIS — E669 Obesity, unspecified: Secondary | ICD-10-CM

## 2017-12-26 DIAGNOSIS — R7303 Prediabetes: Secondary | ICD-10-CM | POA: Diagnosis not present

## 2017-12-26 NOTE — Progress Notes (Signed)
Office: [phone]  /  Fax: [phone]   HPI:   Chief Complaint: OBESITY [name] [name] is here to discuss his progress with his obesity treatment plan. He is on the Category 3 plan and is following his eating plan approximately 70 % of the time. He states he is walking for 15 to 20 minutes 4 to 5 times per week. [name] [name] continues to do well with weight loss, even though he has struggled with more pain issues (back and migraines). He is journaling his calories, macronutrients, daily steps and miles. His total sugar intake is above goal.  His weight is 209 lb (94.8 kg) today and has had a weight loss of 3 pounds over a period of 3 weeks since his last visit. He has lost 21 lbs since starting treatment with [name].  Pre-Diabetes [name] [name] has a diagnosis of pre-diabetes based on his elevated Hgb A1c and was informed this puts him at greater risk of developing diabetes. He is doing well with weight loss, but his total sugar intake is higher than recommended (150 to 200 grams daily). He is exercising well , but has questions about why he struggles to lose abdominal fat. He is not taking metformin currently and continues to work on diet and exercise to decrease risk of diabetes. He denies nausea or hypoglycemia.  ALLERGIES: Allergies  Allergen Reactions  . Shrimp [Shellfish Allergy] Anaphylaxis and Rash  . Codeine Rash and Other (See Comments)    Blacked out  . Contrast Media [Iodinated Diagnostic Agents] Rash  . Diazepam Rash    Rash over whole body from neck down, and he lost his voice (airway compromise?)  . Amoxicillin Other (See Comments)    Unknown   . Benadryl [Diphenhydramine]     With dyes   . Depakote [Divalproex Sodium] Swelling    Per pts report (given by Dr. Inda Merlin).  Has throat swelling and rash.  . Erythromycin Other (See Comments)    unknown  . Flexeril [Cyclobenzaprine]   . Betadine [Povidone Iodine] Rash  . Celebrex [Celecoxib] Rash  . Meperidine And Related Rash  .  Methocarbamol Rash  . Neosporin [Neomycin-Bacitracin Zn-Polymyx] Rash  . Oxycodone Rash  . Penicillins Rash  . Plavix [Clopidogrel] Rash  . Rivaroxaban Rash  . Vioxx [Rofecoxib] Rash    MEDICATIONS: Current Outpatient Medications on File Prior to Visit  Medication Sig Dispense Refill  . allopurinol (ZYLOPRIM) 100 MG tablet TAKE 1 TABLET EVERY DAY 90 tablet 3  . amitriptyline (ELAVIL) 100 MG tablet Take 1 tablet (100 mg total) by mouth at bedtime. 90 tablet 3  . aspirin 81 MG tablet Take 81 mg by mouth daily.    . Cholecalciferol (VITAMIN D PO) Take 1 tablet by mouth daily.    . Coenzyme Q10 (CO Q 10 PO) Take by mouth daily.    . Cyanocobalamin (VITAMIN B 12 PO) Take by mouth.    . DULoxetine (CYMBALTA) 60 MG capsule TAKE 1 CAPSULE BY MOUTH DAILY 30 capsule 9  . EPINEPHRINE 0.3 mg/0.3 mL IJ SOAJ injection INJECT 0.3 MLS INTO THE MUSCLE ONCE 2 mL 1  . Erenumab-aooe (AIMOVIG) 70 MG/ML SOAJ Inject 1 Syringe into the skin every 30 (thirty) days. 2 pen 0  . Erenumab-aooe (AIMOVIG) 70 MG/ML SOAJ Inject 70 mg into the skin every 30 (thirty) days. 6 pen 0  . Gabapentin, Once-Daily, (GRALISE) 300 MG TABS Take 900 mg by mouth every evening. (Patient taking differently: Take 600 mg by mouth every evening. ) 180 tablet 11  .  Gabapentin, Once-Daily, (GRALISE) 300 MG TABS Take 600 mg by mouth every evening. 57 tablet 0  . Multiple Vitamin (MULTIVITAMIN) tablet Take 1 tablet by mouth daily.    . naproxen (NAPROSYN) 500 MG tablet Take 500 mg by mouth 2 (two) times daily with a meal.    . Omega-3 Fatty Acids (FISH OIL) 1000 MG CAPS Take 1 capsule by mouth daily.    . pravastatin (PRAVACHOL) 40 MG tablet TAKE 1 TABLET EVERY DAY 90 tablet 3  . Psyllium (METAMUCIL PO) Take 2 tablets by mouth 2 (two) times daily.    . Red Yeast Rice Extract (RED YEAST RICE PO) Take 1 tablet by mouth daily.    . rizatriptan (MAXALT) 10 MG tablet Take 1 tablet (10 mg total) by mouth as needed for migraine. May repeat in 2  hours if needed 30 tablet 4  . SUMAtriptan (IMITREX) 100 MG tablet TAKE 1 TABLET EVERY 2 HOURS AS NEEDED FOR MIGRAINE OR HEADACHE 9 tablet 5  . tiZANidine (ZANAFLEX) 4 MG capsule Take 4 mg by mouth 3 (three) times daily as needed.     . TURMERIC CURCUMIN PO Take 1,050 mg by mouth daily.    . valACYclovir (VALTREX) 500 MG tablet Take 500 mg by mouth daily.    . vitamin C (ASCORBIC ACID) 500 MG tablet Take 500 mg by mouth daily.    . vitamin E 1000 UNIT capsule Take 1,000 Units by mouth daily.     Current Facility-Administered Medications on File Prior to Visit  Medication Dose Route Frequency Provider Last Rate Last Dose  . 0.9 %  sodium chloride infusion  500 mL Intravenous Once Ladene Artist, MD        PAST MEDICAL HISTORY: Past Medical History:  Diagnosis Date  . ALLERGIC RHINITIS 04/22/2007  . Allergy   . Cancer (HCC)    HX SKIN CANCER  . Complication of anesthesia    more concerned about positioning of head & neck because of TMJ  . DDD (degenerative disc disease), cervical   . GERD (gastroesophageal reflux disease)    infrequently  . Headache(784.0)    migraines  . History of kidney stones   . History of skin cancer   . History of TMJ disorder   . HYPERCHOLESTEROLEMIA 04/22/2007  . HYPERLIPIDEMIA 07/29/2007  . Hyperplastic colon polyp   . NEPHROLITHIASIS, HX OF 07/29/2007  . Osteoarth NOS-Unspec 04/22/2007  . Shortness of breath   . Shoulder pain, acute    bilateral    PAST SURGICAL HISTORY: Past Surgical History:  Procedure Laterality Date  . APPENDECTOMY    . BACK SURGERY  10/07/2015  . COLONOSCOPY     10 yrs ago- 2 HPP   . JOINT REPLACEMENT    . KNEE ARTHROSCOPY     right x2  . SHOULDER SURGERY     right  . skin cancer biopsy     L forehead  . TOTAL KNEE ARTHROPLASTY Right 08/25/2013   Procedure: RIGHT TOTAL KNEE ARTHROPLASTY;  Surgeon: Gearlean Alf, MD;  Location: [facility];  Service: Orthopedics;  Laterality: Right;  . TOTAL SHOULDER ARTHROPLASTY Left  12/25/2013   DR SUPPLE   . TOTAL SHOULDER ARTHROPLASTY Left 12/25/2013   Procedure: LEFT TOTAL SHOULDER ARTHROPLASTY;  Surgeon: Marin Shutter, MD;  Location: [place];  Service: Orthopedics;  Laterality: Left;  . TOTAL SHOULDER ARTHROPLASTY Right 06/18/2014   DR SUPPLE  . TOTAL SHOULDER ARTHROPLASTY Right 06/18/2014   Procedure: RIGHT TOTAL SHOULDER ARTHROPLASTY;  Surgeon: Marin Shutter, MD;  Location: [place];  Service: Orthopedics;  Laterality: Right;  . WISDOM TOOTH EXTRACTION      SOCIAL HISTORY: Social History   Tobacco Use  . Smoking status: Never Smoker  . Smokeless tobacco: Never Used  Substance Use Topics  . Alcohol use: No    Alcohol/week: 0.0 oz  . Drug use: No    FAMILY HISTORY: Family History  Problem Relation Age of Onset  . COPD Father        family hx  . Emphysema Father   . Aneurysm Mother   . Stroke Mother   . Sudden death Mother   . Cancer Mother   . Obesity Mother   . Breast cancer Mother   . Cancer Maternal Grandfather        lung  . Colon cancer Neg Hx   . Colon polyps Neg Hx     ROS: Review of Systems  Constitutional: Positive for weight loss.  Gastrointestinal: Negative for nausea.  Musculoskeletal: Positive for back pain.  Neurological: Positive for headaches.  Endo/Heme/Allergies:       Negative for hypoglycemia    PHYSICAL EXAM: Blood pressure 112/74, pulse 99, temperature 97.9 F (36.6 C), temperature source Oral, height 5\' 9"  (1.753 m), weight 209 lb (94.8 kg), SpO2 95 %. Body mass index is 30.86 kg/m. Physical Exam  Constitutional: He is oriented to person, place, and time. He appears well-developed and well-nourished.  Cardiovascular: Normal rate.  Pulmonary/Chest: Effort normal.  Musculoskeletal: Normal range of motion.  Neurological: He is oriented to person, place, and time.  Skin: Skin is warm and dry.  Psychiatric: He has a normal mood and affect. His behavior is normal.  Vitals reviewed.   RECENT LABS AND TESTS: BMET     Component Value Date/Time   NA 140 11/21/2017 0819   K 4.4 11/21/2017 0819   CL 103 11/21/2017 0819   CO2 21 11/21/2017 0819   GLUCOSE 93 11/21/2017 0819   GLUCOSE 94 07/31/2014 0920   GLUCOSE 94 08/20/2006 0830   BUN 25 11/21/2017 0819   CREATININE 1.14 11/21/2017 0819   CALCIUM 9.3 11/21/2017 0819   GFRNONAA 67 11/21/2017 0819   GFRAA 77 11/21/2017 0819   Lab Results  Component Value Date   HGBA1C 5.5 11/21/2017   HGBA1C 5.8 (H) 06/18/2017   Lab Results  Component Value Date   INSULIN 8.6 11/21/2017   INSULIN 16.5 06/18/2017   CBC    Component Value Date/Time   WBC 5.5 06/18/2017 1047   WBC 5.7 07/31/2014 0920   RBC 4.60 06/18/2017 1047   RBC 4.53 07/31/2014 0920   HGB 15.7 06/18/2017 1047   HCT 45.0 06/18/2017 1047   PLT 313.0 07/31/2014 0920   MCV 98 (H) 06/18/2017 1047   MCH 34.1 (H) 06/18/2017 1047   MCH 31.8 06/10/2014 0955   MCHC 34.9 06/18/2017 1047   MCHC 33.4 07/31/2014 0920   RDW 14.3 06/18/2017 1047   LYMPHSABS 1.8 06/18/2017 1047   MONOABS 0.5 07/31/2014 0920   EOSABS 0.3 06/18/2017 1047   BASOSABS 0.1 06/18/2017 1047   Iron/TIBC/Ferritin/ %Sat No results found for: IRON, TIBC, FERRITIN, IRONPCTSAT Lipid Panel     Component Value Date/Time   CHOL 239 (H) 11/21/2017 0819   TRIG 139 11/21/2017 0819   TRIG 52 08/20/2006 0830   HDL 62 11/21/2017 0819   CHOLHDL 4 07/31/2014 0920   VLDL 24.4 07/31/2014 0920   LDLCALC 149 (H) 11/21/2017 0819   LDLDIRECT  135.0 07/16/2013 0937   Hepatic Function Panel     Component Value Date/Time   PROT 7.0 11/21/2017 0819   ALBUMIN 4.3 11/21/2017 0819   AST 40 11/21/2017 0819   ALT 40 11/21/2017 0819   ALKPHOS 46 11/21/2017 0819   BILITOT 0.5 11/21/2017 0819   BILIDIR 0.1 07/31/2014 0920      Component Value Date/Time   TSH 2.920 06/18/2017 1047   TSH 1.53 07/31/2014 0920   TSH 1.85 07/16/2013 0937   Results for BYFORD, S[employer]S (MRN [id number]) as of 12/26/2017 12:04  Ref. Range 11/21/2017 08:19    Vitamin D, 25-Hydroxy Latest Ref Range: 30.0 - 100.0 ng/mL 59.6   ASSESSMENT AND PLAN: Prediabetes  Class 1 obesity with serious comorbidity and body mass index (BMI) of 31.0 to 31.9 in adult, unspecified obesity type  PLAN:  Pre-Diabetes Leelynd will continue to work on weight loss, exercise, and decreasing simple carbohydrates in his diet to help decrease the risk of diabetes. He was informed that eating too many simple carbohydrates or too many calories at one sitting increases the likelihood of GI side effects. Ladon was advised his visceral fat Is 16 (should be below 10) and decreasing simple carbohydrates and sugar would help. The goal is to eat less than 150 grams of sugar daily. Savan agreed to follow up with [name] as directed to monitor his progress.  We spent > than 50% of the 30 minute visit on the counseling as documented in the note.  Obesity Jie is currently in the action stage of change. As such, his goal is to continue with weight loss efforts He has agreed to follow the Category 3 plan Jarrid has been instructed to work up to a goal of 150 minutes of combined cardio and strengthening exercise per week for weight loss and overall health benefits. We discussed the following Behavioral Modification Strategies today: keeping healthy foods in the home, increasing lean protein intake and decreasing simple carbohydrates   Sarthak has agreed to follow up with our clinic in 3 to 4 weeks. He was informed of the importance of frequent follow up visits to maximize his success with intensive lifestyle modifications for his multiple health conditions.   OBESITY BEHAVIORAL INTERVENTION VISIT  Today's visit was # 9 out of 22.  Starting weight: 230 lbs Starting date: 06/18/17 Today's weight : 209 lbs Today's date: 12/26/2017 Total lbs lost to date: 21 (Patients must lose 7 lbs in the first 6 months to continue with counseling)   ASK: We discussed the diagnosis of obesity with [name] today and [name] agreed to give [name] permission to discuss obesity behavioral modification therapy today.  ASSESS: Paulette has the diagnosis of obesity and his BMI today is 30.85 Jeyson is in the action stage of change   ADVISE: Lorance was educated on the multiple health risks of obesity as well as the benefit of weight loss to improve his health. He was advised of the need for long term treatment and the importance of lifestyle modifications.  AGREE: Multiple dietary modification options and treatment options were discussed and  Yoel agreed to the above obesity treatment plan.  I, Doreene Nest, am acting as transcriptionist for Dennard Nip, MD  I have reviewed the above documentation for accuracy and completeness, and I agree with the above. -Dennard Nip, MD

## 2017-12-29 ENCOUNTER — Other Ambulatory Visit: Payer: Self-pay | Admitting: Internal Medicine

## 2017-12-31 ENCOUNTER — Encounter: Payer: Self-pay | Admitting: Internal Medicine

## 2018-01-02 ENCOUNTER — Encounter: Payer: Self-pay | Admitting: Internal Medicine

## 2018-01-03 NOTE — Telephone Encounter (Signed)
Spoke to patient not sure what happened with the mychart message I responded. I informed that Dr.Kwiatkowski is booked until the 24th. I asked if pt would like to see another provider he stated that he would call back next week to see if Dr.kwiatkowski can see him.

## 2018-01-04 ENCOUNTER — Other Ambulatory Visit: Payer: Self-pay | Admitting: Neurology

## 2018-01-10 ENCOUNTER — Encounter: Payer: Self-pay | Admitting: Internal Medicine

## 2018-01-10 ENCOUNTER — Ambulatory Visit (INDEPENDENT_AMBULATORY_CARE_PROVIDER_SITE_OTHER): Payer: Medicare HMO | Admitting: Internal Medicine

## 2018-01-10 VITALS — BP 110/76 | HR 92 | Temp 98.6°F | Wt 220.0 lb

## 2018-01-10 DIAGNOSIS — G43109 Migraine with aura, not intractable, without status migrainosus: Secondary | ICD-10-CM | POA: Diagnosis not present

## 2018-01-10 DIAGNOSIS — H6123 Impacted cerumen, bilateral: Secondary | ICD-10-CM | POA: Diagnosis not present

## 2018-01-10 DIAGNOSIS — H9313 Tinnitus, bilateral: Secondary | ICD-10-CM

## 2018-01-10 NOTE — Patient Instructions (Signed)
Tinnitus Tinnitus refers to hearing a sound when there is no actual source for that sound. This is often described as ringing in the ears. However, people with this condition may hear a variety of noises. A person may hear the sound in one ear or in both ears. The sounds of tinnitus can be soft, loud, or somewhere in between. Tinnitus can last for a few seconds or can be constant for days. It may go away without treatment and come back at various times. When tinnitus is constant or happens often, it can lead to other problems, such as trouble sleeping and trouble concentrating. Almost everyone experiences tinnitus at some point. Tinnitus that is long-lasting (chronic) or comes back often is a problem that may require medical attention. What are the causes? The cause of tinnitus is often not known. In some cases, it can result from other problems or conditions, including:  Exposure to loud noises from machinery, music, or other sources.  Hearing loss.  Ear or sinus infections.  Earwax buildup.  A foreign object in the ear.  Use of certain medicines.  Use of alcohol and caffeine.  High blood pressure.  Heart diseases.  Anemia.  Allergies.  Meniere disease.  Thyroid problems.  Tumors.  An enlarged part of a weakened blood vessel (aneurysm).  What are the signs or symptoms? The main symptom of tinnitus is hearing a sound when there is no source for that sound. It may sound like:  Buzzing.  Roaring.  Ringing.  Blowing air, similar to the sound heard when you listen to a seashell.  Hissing.  Whistling.  Sizzling.  Humming.  Running water.  A sustained musical note.  How is this diagnosed? Tinnitus is diagnosed based on your symptoms. Your health care provider will do a physical exam. A comprehensive hearing exam (audiologic exam) will be done if your tinnitus:  Affects only one ear (unilateral).  Causes hearing difficulties.  Lasts 6 months or  longer.  You may also need to see a health care provider who specializes in hearing disorders (audiologist). You may be asked to complete a questionnaire to determine the severity of your tinnitus. Tests may be done to help determine the cause and to rule out other conditions. These can include:  Imaging studies of your head and brain, such as: ? A CT scan. ? An MRI.  An imaging study of your blood vessels (angiogram).  How is this treated? Treating an underlying medical condition can sometimes make tinnitus go away. If your tinnitus continues, other treatments may include:  Medicines, such as certain antidepressants or sleeping aids.  Sound generators to mask the tinnitus. These include: ? Tabletop sound machines that play relaxing sounds to help you fall asleep. ? Wearable devices that fit in your ear and play sounds or music. ? A small device that uses headphones to deliver a signal embedded in music (acoustic neural stimulation). In time, this may change the pathways of your brain and make you less sensitive to tinnitus. This device is used for very severe cases when no other treatment is working.  Therapy and counseling to help you manage the stress of living with tinnitus.  Using hearing aids or cochlear implants, if your tinnitus is related to hearing loss.  Follow these instructions at home:  When possible, avoid being in loud places and being exposed to loud sounds.  Wear hearing protection, such as earplugs, when you are exposed to loud noises.  Do not take stimulants, such as nicotine,   alcohol, or caffeine.  Practice techniques for reducing stress, such as meditation, yoga, or deep breathing.  Use a white noise machine, a humidifier, or other devices to mask the sound of tinnitus.  Sleep with your head slightly raised. This may reduce the impact of tinnitus.  Try to get plenty of rest each night. Contact a health care provider if:  You have tinnitus in just one  ear.  Your tinnitus continues for 3 weeks or longer without stopping.  Home care measures are not helping.  You have tinnitus after a head injury.  You have tinnitus along with any of the following: ? Dizziness. ? Loss of balance. ? Nausea and vomiting. This information is not intended to replace advice given to you by your health care provider. Make sure you discuss any questions you have with your health care provider. Document Released: 09/11/2005 Document Revised: 05/14/2016 Document Reviewed: 02/11/2014 Elsevier Interactive Patient Education  2018 [place], Adult The ears produce a substance called earwax that helps keep bacteria out of the ear and protects the skin in the ear canal. Occasionally, earwax can build up in the ear and cause discomfort or hearing loss. What increases the risk? This condition is more likely to develop in people who:  Are male.  Are elderly.  Naturally produce more earwax.  Clean their ears often with cotton swabs.  Use earplugs often.  Use in-ear headphones often.  Wear hearing aids.  Have narrow ear canals.  Have earwax that is overly thick or sticky.  Have eczema.  Are dehydrated.  Have excess hair in the ear canal.  What are the signs or symptoms? Symptoms of this condition include:  Reduced or muffled hearing.  A feeling of fullness in the ear or feeling that the ear is plugged.  Fluid coming from the ear.  Ear pain.  Ear itch.  Ringing in the ear.  Coughing.  An obvious piece of earwax that can be seen inside the ear canal.  How is this diagnosed? This condition may be diagnosed based on:  Your symptoms.  Your medical history.  An ear exam. During the exam, your health care provider will look into your ear with an instrument called an otoscope.  You may have tests, including a hearing test. How is this treated? This condition may be treated by:  Using ear drops to soften the  earwax.  Having the earwax removed by a health care provider. The health care provider may: ? Flush the ear with water. ? Use an instrument that has a loop on the end (curette). ? Use a suction device.  Surgery to remove the wax buildup. This may be done in severe cases.  Follow these instructions at home:  Take over-the-counter and prescription medicines only as told by your health care provider.  Do not put any objects, including cotton swabs, into your ear. You can clean the opening of your ear canal with a washcloth or facial tissue.  Follow instructions from your health care provider about cleaning your ears. Do not over-clean your ears.  Drink enough fluid to keep your urine clear or pale yellow. This will help to thin the earwax.  Keep all follow-up visits as told by your health care provider. If earwax builds up in your ears often or if you use hearing aids, consider seeing your health care provider for routine, preventive ear cleanings. Ask your health care provider how often you should schedule your cleanings.  If you have  hearing aids, clean them according to instructions from the manufacturer and your health care provider. Contact a health care provider if:  You have ear pain.  You develop a fever.  You have blood, pus, or other fluid coming from your ear.  You have hearing loss.  You have ringing in your ears that does not go away.  Your symptoms do not improve with treatment.  You feel like the room is spinning (vertigo). Summary  Earwax can build up in the ear and cause discomfort or hearing loss.  The most common symptoms of this condition include reduced or muffled hearing and a feeling of fullness in the ear or feeling that the ear is plugged.  This condition may be diagnosed based on your symptoms, your medical history, and an ear exam.  This condition may be treated by using ear drops to soften the earwax or by having the earwax removed by a health  care provider.  Do not put any objects, including cotton swabs, into your ear. You can clean the opening of your ear canal with a washcloth or facial tissue. This information is not intended to replace advice given to you by your health care provider. Make sure you discuss any questions you have with your health care provider. Document Released: 10/19/2004 Document Revised: 11/22/2016 Document Reviewed: 11/22/2016 Elsevier Interactive Patient Education  [employer].

## 2018-01-10 NOTE — Progress Notes (Signed)
Subjective:    Patient ID: [name] [name], male    DOB: 1951-05-21, 67 y.o.   MRN: [id number]  HPI 67 year old patient who was seen today with a chief complaint of tinnitus.  This has been bilateral and present for about 6 weeks.  He had a very brief episode of dizziness consistent with mild vertigo about 2 weeks ago that has not reoccurred.  Denies any focal neurological symptoms Denies any hearing loss. He does have a history of chronic and severe migraines.  Evaluation included a brain MRI in January 2018 that was unremarkable  Past Medical History:  Diagnosis Date  . ALLERGIC RHINITIS 04/22/2007  . Allergy   . Cancer (HCC)    HX SKIN CANCER  . Complication of anesthesia    more concerned about positioning of head & neck because of TMJ  . DDD (degenerative disc disease), cervical   . GERD (gastroesophageal reflux disease)    infrequently  . Headache(784.0)    migraines  . History of kidney stones   . History of skin cancer   . History of TMJ disorder   . HYPERCHOLESTEROLEMIA 04/22/2007  . HYPERLIPIDEMIA 07/29/2007  . Hyperplastic colon polyp   . NEPHROLITHIASIS, HX OF 07/29/2007  . Osteoarth NOS-Unspec 04/22/2007  . Shortness of breath   . Shoulder pain, acute    bilateral     Social History   Socioeconomic History  . Marital status: Married    Spouse name: Not on file  . Number of children: 1  . Years of education: 16+  . Highest education level: Bachelor's degree (e.g., BA, AB, BS)  Occupational History  . Occupation: Retired  Scientific laboratory technician  . Financial resource strain: Not on file  . Food insecurity:    Worry: Not on file    Inability: Not on file  . Transportation needs:    Medical: Not on file    Non-medical: Not on file  Tobacco Use  . Smoking status: Never Smoker  . Smokeless tobacco: Never Used  Substance and Sexual Activity  . Alcohol use: No    Alcohol/week: 0.0 oz  . Drug use: No  . Sexual activity: Yes    Partners: Female    Birth  control/protection: Implant  Lifestyle  . Physical activity:    Days per week: Not on file    Minutes per session: Not on file  . Stress: Not on file  Relationships  . Social connections:    Talks on phone: Not on file    Gets together: Not on file    Attends religious service: Not on file    Active member of club or organization: Not on file    Attends meetings of clubs or organizations: Not on file    Relationship status: Not on file  . Intimate partner violence:    Fear of current or ex partner: Not on file    Emotionally abused: Not on file    Physically abused: Not on file    Forced sexual activity: Not on file  Other Topics Concern  . Not on file  Social History Narrative   Lives at home w/ his wife   Right-handed   Caffeine: 2 cups of coffee each morning    Past Surgical History:  Procedure Laterality Date  . APPENDECTOMY    . BACK SURGERY  10/07/2015  . COLONOSCOPY     10 yrs ago- 2 HPP   . JOINT REPLACEMENT    . KNEE ARTHROSCOPY  right x2  . SHOULDER SURGERY     right  . skin cancer biopsy     L forehead  . TOTAL KNEE ARTHROPLASTY Right 08/25/2013   Procedure: RIGHT TOTAL KNEE ARTHROPLASTY;  Surgeon: Gearlean Alf, MD;  Location: [facility];  Service: Orthopedics;  Laterality: Right;  . TOTAL SHOULDER ARTHROPLASTY Left 12/25/2013   DR SUPPLE   . TOTAL SHOULDER ARTHROPLASTY Left 12/25/2013   Procedure: LEFT TOTAL SHOULDER ARTHROPLASTY;  Surgeon: Marin Shutter, MD;  Location: [facility];  Service: Orthopedics;  Laterality: Left;  . TOTAL SHOULDER ARTHROPLASTY Right 06/18/2014   DR SUPPLE  . TOTAL SHOULDER ARTHROPLASTY Right 06/18/2014   Procedure: RIGHT TOTAL SHOULDER ARTHROPLASTY;  Surgeon: Marin Shutter, MD;  Location: [facility];  Service: Orthopedics;  Laterality: Right;  . WISDOM TOOTH EXTRACTION      Family History  Problem Relation Age of Onset  . COPD Father        family hx  . Emphysema Father   . Aneurysm Mother   . Stroke Mother   . Sudden death  Mother   . Cancer Mother   . Obesity Mother   . Breast cancer Mother   . Cancer Maternal Grandfather        lung  . Colon cancer Neg Hx   . Colon polyps Neg Hx     Allergies  Allergen Reactions  . Shrimp [Shellfish Allergy] Anaphylaxis and Rash  . Codeine Rash and Other (See Comments)    Blacked out  . Contrast Media [Iodinated Diagnostic Agents] Rash  . Diazepam Rash    Rash over whole body from neck down, and he lost his voice (airway compromise?)  . Amoxicillin Other (See Comments)    Unknown   . Benadryl [Diphenhydramine]     With dyes   . Depakote [Divalproex Sodium] Swelling    Per pts report (given by Dr. Inda Merlin).  Has throat swelling and rash.  . Erythromycin Other (See Comments)    unknown  . Flexeril [Cyclobenzaprine]   . Betadine [Povidone Iodine] Rash  . Celebrex [Celecoxib] Rash  . Meperidine And Related Rash  . Methocarbamol Rash  . Neosporin [Neomycin-Bacitracin Zn-Polymyx] Rash  . Oxycodone Rash  . Penicillins Rash  . Plavix [Clopidogrel] Rash  . Rivaroxaban Rash  . Vioxx [Rofecoxib] Rash    Current Outpatient Medications on File Prior to Visit  Medication Sig Dispense Refill  . allopurinol (ZYLOPRIM) 100 MG tablet TAKE 1 TABLET EVERY DAY 90 tablet 3  . amitriptyline (ELAVIL) 100 MG tablet Take 1 tablet (100 mg total) by mouth at bedtime. 90 tablet 3  . aspirin 81 MG tablet Take 81 mg by mouth daily.    . Cholecalciferol (VITAMIN D PO) Take 1 tablet by mouth daily.    . Coenzyme Q10 (CO Q 10 PO) Take by mouth daily.    . Cyanocobalamin (VITAMIN B 12 PO) Take by mouth.    . DULoxetine (CYMBALTA) 60 MG capsule TAKE 1 CAPSULE BY MOUTH DAILY 30 capsule 9  . EPINEPHRINE 0.3 mg/0.3 mL IJ SOAJ injection INJECT 0.3 MLS INTO THE MUSCLE ONCE 2 mL 1  . Erenumab-aooe (AIMOVIG) 70 MG/ML SOAJ Inject 1 Syringe into the skin every 30 (thirty) days. 2 pen 0  . Erenumab-aooe (AIMOVIG) 70 MG/ML SOAJ Inject 70 mg into the skin every 30 (thirty) days. 6 pen 0  .  Gabapentin, Once-Daily, (GRALISE) 300 MG TABS Take 900 mg by mouth every evening. (Patient taking differently: Take 600 mg by mouth  every evening. ) 180 tablet 11  . Gabapentin, Once-Daily, (GRALISE) 300 MG TABS Take 600 mg by mouth every evening. 57 tablet 0  . Multiple Vitamin (MULTIVITAMIN) tablet Take 1 tablet by mouth daily.    . naproxen (NAPROSYN) 500 MG tablet Take 500 mg by mouth 2 (two) times daily with a meal.    . Omega-3 Fatty Acids (FISH OIL) 1000 MG CAPS Take 1 capsule by mouth daily.    . pravastatin (PRAVACHOL) 40 MG tablet TAKE 1 TABLET EVERY DAY 90 tablet 3  . Psyllium (METAMUCIL PO) Take 2 tablets by mouth 2 (two) times daily.    . Red Yeast Rice Extract (RED YEAST RICE PO) Take 1 tablet by mouth daily.    . rizatriptan (MAXALT) 10 MG tablet Take 1 tablet (10 mg total) by mouth as needed for migraine. May repeat in 2 hours if needed 30 tablet 4  . SUMAtriptan (IMITREX) 100 MG tablet TAKE 1 TABLET EVERY 2 HOURS AS NEEDED FOR MIGRAINE OR HEADACHE 9 tablet 5  . tiZANidine (ZANAFLEX) 4 MG capsule Take 4 mg by mouth 3 (three) times daily as needed.     . TURMERIC CURCUMIN PO Take 1,050 mg by mouth daily.    . valACYclovir (VALTREX) 500 MG tablet Take 500 mg by mouth daily.    . vitamin C (ASCORBIC ACID) 500 MG tablet Take 500 mg by mouth daily.    . vitamin E 1000 UNIT capsule Take 1,000 Units by mouth daily.     Current Facility-Administered Medications on File Prior to Visit  Medication Dose Route Frequency Provider Last Rate Last Dose  . 0.9 %  sodium chloride infusion  500 mL Intravenous Once Ladene Artist, MD        BP 110/76 (BP Location: Right Arm, Patient Position: Sitting, Cuff Size: Large)   Pulse 92   Temp 98.6 F (37 C) (Oral)   Wt 220 lb (99.8 kg)   SpO2 94%   BMI 32.49 kg/m      Review of Systems  Constitutional: Negative for appetite change, chills, fatigue and fever.  HENT: Positive for tinnitus. Negative for congestion, dental problem, ear pain,  hearing loss, sore throat, trouble swallowing and voice change.   Eyes: Negative for pain, discharge and visual disturbance.  Respiratory: Negative for cough, chest tightness, wheezing and stridor.   Cardiovascular: Negative for chest pain, palpitations and leg swelling.  Gastrointestinal: Negative for abdominal distention, abdominal pain, blood in stool, constipation, diarrhea, nausea and vomiting.  Genitourinary: Negative for difficulty urinating, discharge, flank pain, genital sores, hematuria and urgency.  Musculoskeletal: Negative for arthralgias, back pain, gait problem, joint swelling, myalgias and neck stiffness.  Skin: Negative for rash.  Neurological: Positive for headaches. Negative for dizziness, syncope, speech difficulty, weakness and numbness.  Hematological: Negative for adenopathy. Does not bruise/bleed easily.  Psychiatric/Behavioral: Negative for behavioral problems and dysphoric mood. The patient is not nervous/anxious.        Objective:   Physical Exam  Constitutional: He is oriented to person, place, and time. He appears well-developed.  HENT:  Head: Normocephalic.  Right Ear: External ear normal.  Left Ear: External ear normal.  Mouth/Throat: Oropharynx is clear and moist.  The left canal was  completely occluded.  Wax also noted in the right canal; Weber did not lateralize  Eyes: Conjunctivae and EOM are normal.  Neck: Normal range of motion.  Cardiovascular: Normal rate and normal heart sounds.  Pulmonary/Chest: Breath sounds normal.  Abdominal: Bowel sounds are  normal.  Musculoskeletal: Normal range of motion. He exhibits no edema or tenderness.  Neurological: He is alert and oriented to person, place, and time. No cranial nerve deficit.  Normal gait.  Normal finger-to-nose testing  Psychiatric: He has a normal mood and affect. His behavior is normal.          Assessment & Plan:  Tinnitus History of unremarkable brain MRI January 2018  Chronic  migraines  Cerumen impactions. Procedure note.  Both canals irrigated until clear  Osteoarthritis  Options discussed.  Patient wishes to observe at the present time and see how he does after ear irrigation.  Patient information dispensed he report any new or worsening symptoms  Nyoka Cowden

## 2018-01-23 ENCOUNTER — Ambulatory Visit (INDEPENDENT_AMBULATORY_CARE_PROVIDER_SITE_OTHER): Payer: Medicare HMO | Admitting: Family Medicine

## 2018-01-23 ENCOUNTER — Encounter (INDEPENDENT_AMBULATORY_CARE_PROVIDER_SITE_OTHER): Payer: Self-pay | Admitting: Family Medicine

## 2018-01-23 VITALS — BP 117/78 | HR 98 | Temp 98.2°F | Ht 69.0 in | Wt 204.0 lb

## 2018-01-23 DIAGNOSIS — Z6830 Body mass index (BMI) 30.0-30.9, adult: Secondary | ICD-10-CM | POA: Diagnosis not present

## 2018-01-23 DIAGNOSIS — R7303 Prediabetes: Secondary | ICD-10-CM

## 2018-01-23 DIAGNOSIS — E669 Obesity, unspecified: Secondary | ICD-10-CM

## 2018-01-23 NOTE — Progress Notes (Signed)
Office: [phone]  /  Fax: [phone]   HPI:   Chief Complaint: OBESITY [name] [name] is here to discuss his progress with his obesity treatment plan. He is on the Category 3 plan and is following his eating plan approximately 75 % of the time. He states he is walking for 90 minutes 7 times per week. [name] [name] continues to do well with weight loss. He is tracking all his micro nutrients and doing well meeting his protein goals. Hunger is controlled. His weight is 204 lb (92.5 kg) today and has had a weight loss of 5 pounds over a period of 4 weeks since his last visit. He has lost 26 lbs since starting treatment with [name].  Pre-Diabetes [name] [name] has a diagnosis of pre-diabetes based on his elevated Hgb A1c and was informed this puts him at greater risk of developing diabetes. He continues to work on diet, he is doing well with weight loss but his daily sugar intake is frequently>150 grams of sugar per day. He has significantly increased walking approximately 15,000 to 20,000 steps daily which helps greatly. He is taking metformin currently and denies nausea or hypoglycemia.  ALLERGIES: Allergies  Allergen Reactions  . Shrimp [Shellfish Allergy] Anaphylaxis and Rash  . Codeine Rash and Other (See Comments)    Blacked out  . Contrast Media [Iodinated Diagnostic Agents] Rash  . Diazepam Rash    Rash over whole body from neck down, and he lost his voice (airway compromise?)  . Amoxicillin Other (See Comments)    Unknown   . Benadryl [Diphenhydramine]     With dyes   . Depakote [Divalproex Sodium] Swelling    Per pts report (given by Dr. Inda Merlin).  Has throat swelling and rash.  . Erythromycin Other (See Comments)    unknown  . Flexeril [Cyclobenzaprine]   . Betadine [Povidone Iodine] Rash  . Celebrex [Celecoxib] Rash  . Meperidine And Related Rash  . Methocarbamol Rash  . Neosporin [Neomycin-Bacitracin Zn-Polymyx] Rash  . Oxycodone Rash  . Penicillins Rash  . Plavix [Clopidogrel] Rash    . Rivaroxaban Rash  . Vioxx [Rofecoxib] Rash    MEDICATIONS: Current Outpatient Medications on File Prior to Visit  Medication Sig Dispense Refill  . allopurinol (ZYLOPRIM) 100 MG tablet TAKE 1 TABLET EVERY DAY 90 tablet 3  . amitriptyline (ELAVIL) 100 MG tablet Take 1 tablet (100 mg total) by mouth at bedtime. 90 tablet 3  . aspirin 81 MG tablet Take 81 mg by mouth daily.    . Cholecalciferol (VITAMIN D PO) Take 1 tablet by mouth daily.    . Coenzyme Q10 (CO Q 10 PO) Take by mouth daily.    . Cyanocobalamin (VITAMIN B 12 PO) Take by mouth.    . DULoxetine (CYMBALTA) 60 MG capsule TAKE 1 CAPSULE BY MOUTH DAILY 30 capsule 9  . EPINEPHRINE 0.3 mg/0.3 mL IJ SOAJ injection INJECT 0.3 MLS INTO THE MUSCLE ONCE 2 mL 1  . Erenumab-aooe (AIMOVIG) 70 MG/ML SOAJ Inject 1 Syringe into the skin every 30 (thirty) days. 2 pen 0  . Erenumab-aooe (AIMOVIG) 70 MG/ML SOAJ Inject 70 mg into the skin every 30 (thirty) days. 6 pen 0  . Gabapentin, Once-Daily, (GRALISE) 300 MG TABS Take 900 mg by mouth every evening. (Patient taking differently: Take 600 mg by mouth every evening. ) 180 tablet 11  . Gabapentin, Once-Daily, (GRALISE) 300 MG TABS Take 600 mg by mouth every evening. 57 tablet 0  . Multiple Vitamin (MULTIVITAMIN) tablet Take 1 tablet by  mouth daily.    . naproxen (NAPROSYN) 500 MG tablet Take 500 mg by mouth 2 (two) times daily with a meal.    . Omega-3 Fatty Acids (FISH OIL) 1000 MG CAPS Take 1 capsule by mouth daily.    . pravastatin (PRAVACHOL) 40 MG tablet TAKE 1 TABLET EVERY DAY 90 tablet 3  . Psyllium (METAMUCIL PO) Take 2 tablets by mouth 2 (two) times daily.    . Red Yeast Rice Extract (RED YEAST RICE PO) Take 1 tablet by mouth daily.    . rizatriptan (MAXALT) 10 MG tablet Take 1 tablet (10 mg total) by mouth as needed for migraine. May repeat in 2 hours if needed 30 tablet 4  . SUMAtriptan (IMITREX) 100 MG tablet TAKE 1 TABLET EVERY 2 HOURS AS NEEDED FOR MIGRAINE OR HEADACHE 9 tablet 5  .  tiZANidine (ZANAFLEX) 4 MG capsule Take 4 mg by mouth 3 (three) times daily as needed.     . TURMERIC CURCUMIN PO Take 1,050 mg by mouth daily.    . valACYclovir (VALTREX) 500 MG tablet Take 500 mg by mouth daily.    . vitamin C (ASCORBIC ACID) 500 MG tablet Take 500 mg by mouth daily.    . vitamin E 1000 UNIT capsule Take 1,000 Units by mouth daily.     Current Facility-Administered Medications on File Prior to Visit  Medication Dose Route Frequency Provider Last Rate Last Dose  . 0.9 %  sodium chloride infusion  500 mL Intravenous Once Ladene Artist, MD        PAST MEDICAL HISTORY: Past Medical History:  Diagnosis Date  . ALLERGIC RHINITIS 04/22/2007  . Allergy   . Cancer (HCC)    HX SKIN CANCER  . Complication of anesthesia    more concerned about positioning of head & neck because of TMJ  . DDD (degenerative disc disease), cervical   . GERD (gastroesophageal reflux disease)    infrequently  . Headache(784.0)    migraines  . History of kidney stones   . History of skin cancer   . History of TMJ disorder   . HYPERCHOLESTEROLEMIA 04/22/2007  . HYPERLIPIDEMIA 07/29/2007  . Hyperplastic colon polyp   . NEPHROLITHIASIS, HX OF 07/29/2007  . Osteoarth NOS-Unspec 04/22/2007  . Shortness of breath   . Shoulder pain, acute    bilateral    PAST SURGICAL HISTORY: Past Surgical History:  Procedure Laterality Date  . APPENDECTOMY    . BACK SURGERY  10/07/2015  . COLONOSCOPY     10 yrs ago- 2 HPP   . JOINT REPLACEMENT    . KNEE ARTHROSCOPY     right x2  . SHOULDER SURGERY     right  . skin cancer biopsy     L forehead  . TOTAL KNEE ARTHROPLASTY Right 08/25/2013   Procedure: RIGHT TOTAL KNEE ARTHROPLASTY;  Surgeon: Gearlean Alf, MD;  Location: [facility];  Service: Orthopedics;  Laterality: Right;  . TOTAL SHOULDER ARTHROPLASTY Left 12/25/2013   DR SUPPLE   . TOTAL SHOULDER ARTHROPLASTY Left 12/25/2013   Procedure: LEFT TOTAL SHOULDER ARTHROPLASTY;  Surgeon: Marin Shutter, MD;   Location: [place] [facility];  Service: Orthopedics;  Laterality: Left;  . TOTAL SHOULDER ARTHROPLASTY Right 06/18/2014   DR SUPPLE  . TOTAL SHOULDER ARTHROPLASTY Right 06/18/2014   Procedure: RIGHT TOTAL SHOULDER ARTHROPLASTY;  Surgeon: Marin Shutter, MD;  Location: [facility];  Service: Orthopedics;  Laterality: Right;  . WISDOM TOOTH EXTRACTION      SOCIAL  HISTORY: Social History   Tobacco Use  . Smoking status: Never Smoker  . Smokeless tobacco: Never Used  Substance Use Topics  . Alcohol use: No    Alcohol/week: 0.0 oz  . Drug use: No    FAMILY HISTORY: Family History  Problem Relation Age of Onset  . COPD Father        family hx  . Emphysema Father   . Aneurysm Mother   . Stroke Mother   . Sudden death Mother   . Cancer Mother   . Obesity Mother   . Breast cancer Mother   . Cancer Maternal Grandfather        lung  . Colon cancer Neg Hx   . Colon polyps Neg Hx     ROS: Review of Systems  Constitutional: Positive for weight loss.  Gastrointestinal: Negative for nausea.  Endo/Heme/Allergies:       Negative hypoglycemia    PHYSICAL EXAM: Blood pressure 117/78, pulse 98, temperature 98.2 F (36.8 C), temperature source Oral, height 5\' 9"  (1.753 m), weight 204 lb (92.5 kg), SpO2 97 %. Body mass index is 30.13 kg/m. Physical Exam  Constitutional: He is oriented to person, place, and time. He appears well-developed and well-nourished.  Cardiovascular: Normal rate.  Pulmonary/Chest: Effort normal.  Musculoskeletal: Normal range of motion.  Neurological: He is oriented to person, place, and time.  Skin: Skin is warm and dry.  Psychiatric: He has a normal mood and affect. His behavior is normal.  Vitals reviewed.   RECENT LABS AND TESTS: BMET    Component Value Date/Time   NA 140 11/21/2017 0819   K 4.4 11/21/2017 0819   CL 103 11/21/2017 0819   CO2 21 11/21/2017 0819   GLUCOSE 93 11/21/2017 0819   GLUCOSE 94 07/31/2014 0920   GLUCOSE 94 08/20/2006 0830   BUN 25  11/21/2017 0819   CREATININE 1.14 11/21/2017 0819   CALCIUM 9.3 11/21/2017 0819   GFRNONAA 67 11/21/2017 0819   GFRAA 77 11/21/2017 0819   Lab Results  Component Value Date   HGBA1C 5.5 11/21/2017   HGBA1C 5.8 (H) 06/18/2017   Lab Results  Component Value Date   INSULIN 8.6 11/21/2017   INSULIN 16.5 06/18/2017   CBC    Component Value Date/Time   WBC 5.5 06/18/2017 1047   WBC 5.7 07/31/2014 0920   RBC 4.60 06/18/2017 1047   RBC 4.53 07/31/2014 0920   HGB 15.7 06/18/2017 1047   HCT 45.0 06/18/2017 1047   PLT 313.0 07/31/2014 0920   MCV 98 (H) 06/18/2017 1047   MCH 34.1 (H) 06/18/2017 1047   MCH 31.8 06/10/2014 0955   MCHC 34.9 06/18/2017 1047   MCHC 33.4 07/31/2014 0920   RDW 14.3 06/18/2017 1047   LYMPHSABS 1.8 06/18/2017 1047   MONOABS 0.5 07/31/2014 0920   EOSABS 0.3 06/18/2017 1047   BASOSABS 0.1 06/18/2017 1047   Iron/TIBC/Ferritin/ %Sat No results found for: IRON, TIBC, FERRITIN, IRONPCTSAT Lipid Panel     Component Value Date/Time   CHOL 239 (H) 11/21/2017 0819   TRIG 139 11/21/2017 0819   TRIG 52 08/20/2006 0830   HDL 62 11/21/2017 0819   CHOLHDL 4 07/31/2014 0920   VLDL 24.4 07/31/2014 0920   LDLCALC 149 (H) 11/21/2017 0819   LDLDIRECT 135.0 07/16/2013 0937   Hepatic Function Panel     Component Value Date/Time   PROT 7.0 11/21/2017 0819   ALBUMIN 4.3 11/21/2017 0819   AST 40 11/21/2017 0819   ALT 40 11/21/2017  [place] 11/21/2017 0819   BILITOT 0.5 11/21/2017 0819   BILIDIR 0.1 07/31/2014 0920      Component Value Date/Time   TSH 2.920 06/18/2017 1047   TSH 1.53 07/31/2014 0920   TSH 1.85 07/16/2013 0937    ASSESSMENT AND PLAN: Prediabetes  Class 1 obesity with serious comorbidity and body mass index (BMI) of 30.0 to 30.9 in adult, unspecified obesity type  PLAN:  Pre-Diabetes [name] [name] will continue to work on weight loss, diet, exercise, and work to decrease daily sugar intake to below 150, and decrease simple carbohydrates  in his diet to help decrease the risk of diabetes. We dicussed metformin including benefits and risks. He was informed that eating too many simple carbohydrates or too many calories at one sitting increases the likelihood of GI side effects. Chosen declined metformin for now and a prescription was not written today. [name] [name] agrees to follow up with our clinic in 4 weeks as directed to monitor his progress.  We spent > than 50% of the 30 minute visit on the counseling as documented in the note.  Obesity [name] [name] is currently in the action stage of change. As such, his goal is to continue with weight loss efforts He has agreed to keep a food journal with 2000-2500 calories and 100+ grams of protein daily [name] [name] has been instructed to work up to a goal of 150 minutes of combined cardio and strengthening exercise per week for weight loss and overall health benefits. We discussed the following Behavioral Modification Strategies today: increasing lean protein intake, decreasing simple carbohydrates  and work on meal planning and easy cooking plans   [name] [name] has agreed to follow up with our clinic in 4 weeks. He was informed of the importance of frequent follow up visits to maximize his success with intensive lifestyle modifications for his multiple health conditions.   OBESITY BEHAVIORAL INTERVENTION VISIT  Today's visit was # 10 out of 22.  Starting weight: 230 lbs Starting date: 06/18/17 Today's weight : 204 lbs Today's date: 01/23/2018 Total lbs lost to date: 16 (Patients must lose 7 lbs in the first 6 months to continue with counseling)   ASK: We discussed the diagnosis of obesity with [name] [name] today and [name] [name] agreed to give [name] permission to discuss obesity behavioral modification therapy today.  ASSESS: [name] [name] has the diagnosis of obesity and his BMI today is 30.11 [name] [name] is in the action stage of change   ADVISE: [name] [name] was educated on the multiple health risks of obesity as well as the  benefit of weight loss to improve his health. He was advised of the need for long term treatment and the importance of lifestyle modifications.  AGREE: Multiple dietary modification options and treatment options were discussed and  [name] [name] agreed to the above obesity treatment plan.  I, Trixie Dredge, am acting as transcriptionist for Dennard Nip, MD  I have reviewed the above documentation for accuracy and completeness, and I agree with the above. -Dennard Nip, MD

## 2018-02-20 ENCOUNTER — Ambulatory Visit (INDEPENDENT_AMBULATORY_CARE_PROVIDER_SITE_OTHER): Payer: Medicare HMO | Admitting: Family Medicine

## 2018-02-20 VITALS — BP 101/69 | HR 98 | Temp 98.2°F | Ht 69.0 in | Wt 204.0 lb

## 2018-02-20 DIAGNOSIS — Z6830 Body mass index (BMI) 30.0-30.9, adult: Secondary | ICD-10-CM | POA: Diagnosis not present

## 2018-02-20 DIAGNOSIS — E669 Obesity, unspecified: Secondary | ICD-10-CM

## 2018-02-20 DIAGNOSIS — E7849 Other hyperlipidemia: Secondary | ICD-10-CM | POA: Diagnosis not present

## 2018-02-20 NOTE — Progress Notes (Signed)
Office: [phone]  /  Fax: [phone]   HPI:   Chief Complaint: OBESITY Brain is here to discuss his progress with his obesity treatment plan. He is on the  keep a food journal with 2000-2500 calories and 100+ grams of protein  and is following his eating plan approximately 45 % of the time. He states he is walking 5 miles 7 times per week. [name] [name] has done well maintaining his weight but is disappointed he didn't do better. His average daily kcal=3,000 and protein intake well over 100 grams daily but his steps are approximately 20,000 daily. He did have extra temptations with sugar and ice cream. His weight is 204 lb (92.5 kg) today and has not lost weight since his last visit. He has lost 26 lbs since starting treatment with [name].  Hyperlipidemia Randal has hyperlipidemia and he is doing well decreasing his cholesterol in diet and has significantly increased exercise. He is stable on statin. He denies any chest pain, claudication or myalgias.  ALLERGIES: Allergies  Allergen Reactions  . Shrimp [Shellfish Allergy] Anaphylaxis and Rash  . Codeine Rash and Other (See Comments)    Blacked out  . Contrast Media [Iodinated Diagnostic Agents] Rash  . Diazepam Rash    Rash over whole body from neck down, and he lost his voice (airway compromise?)  . Amoxicillin Other (See Comments)    Unknown   . Benadryl [Diphenhydramine]     With dyes   . Depakote [Divalproex Sodium] Swelling    Per pts report (given by Dr. Inda Merlin).  Has throat swelling and rash.  . Erythromycin Other (See Comments)    unknown  . Flexeril [Cyclobenzaprine]   . Betadine [Povidone Iodine] Rash  . Celebrex [Celecoxib] Rash  . Meperidine And Related Rash  . Methocarbamol Rash  . Neosporin [Neomycin-Bacitracin Zn-Polymyx] Rash  . Oxycodone Rash  . Penicillins Rash  . Plavix [Clopidogrel] Rash  . Rivaroxaban Rash  . Vioxx [Rofecoxib] Rash    MEDICATIONS: Current Outpatient Medications on File Prior to Visit    Medication Sig Dispense Refill  . allopurinol (ZYLOPRIM) 100 MG tablet TAKE 1 TABLET EVERY DAY 90 tablet 3  . amitriptyline (ELAVIL) 100 MG tablet Take 1 tablet (100 mg total) by mouth at bedtime. 90 tablet 3  . aspirin 81 MG tablet Take 81 mg by mouth daily.    . Cholecalciferol (VITAMIN D PO) Take 1 tablet by mouth daily.    . Coenzyme Q10 (CO Q 10 PO) Take by mouth daily.    . Cyanocobalamin (VITAMIN B 12 PO) Take by mouth.    . DULoxetine (CYMBALTA) 60 MG capsule TAKE 1 CAPSULE BY MOUTH DAILY 30 capsule 9  . EPINEPHRINE 0.3 mg/0.3 mL IJ SOAJ injection INJECT 0.3 MLS INTO THE MUSCLE ONCE 2 mL 1  . Erenumab-aooe (AIMOVIG) 70 MG/ML SOAJ Inject 1 Syringe into the skin every 30 (thirty) days. 2 pen 0  . Erenumab-aooe (AIMOVIG) 70 MG/ML SOAJ Inject 70 mg into the skin every 30 (thirty) days. 6 pen 0  . Gabapentin, Once-Daily, (GRALISE) 300 MG TABS Take 900 mg by mouth every evening. (Patient taking differently: Take 600 mg by mouth every evening. ) 180 tablet 11  . Gabapentin, Once-Daily, (GRALISE) 300 MG TABS Take 600 mg by mouth every evening. 57 tablet 0  . Multiple Vitamin (MULTIVITAMIN) tablet Take 1 tablet by mouth daily.    . naproxen (NAPROSYN) 500 MG tablet Take 500 mg by mouth 2 (two) times daily with a meal.    .  Omega-3 Fatty Acids (FISH OIL) 1000 MG CAPS Take 1 capsule by mouth daily.    . pravastatin (PRAVACHOL) 40 MG tablet TAKE 1 TABLET EVERY DAY 90 tablet 3  . Psyllium (METAMUCIL PO) Take 2 tablets by mouth 2 (two) times daily.    . Red Yeast Rice Extract (RED YEAST RICE PO) Take 1 tablet by mouth daily.    . rizatriptan (MAXALT) 10 MG tablet Take 1 tablet (10 mg total) by mouth as needed for migraine. May repeat in 2 hours if needed 30 tablet 4  . SUMAtriptan (IMITREX) 100 MG tablet TAKE 1 TABLET EVERY 2 HOURS AS NEEDED FOR MIGRAINE OR HEADACHE 9 tablet 5  . tiZANidine (ZANAFLEX) 4 MG capsule Take 4 mg by mouth 3 (three) times daily as needed.     . TURMERIC CURCUMIN PO Take  1,050 mg by mouth daily.    . valACYclovir (VALTREX) 500 MG tablet Take 500 mg by mouth daily.    . vitamin C (ASCORBIC ACID) 500 MG tablet Take 500 mg by mouth daily.    . vitamin E 1000 UNIT capsule Take 1,000 Units by mouth daily.     Current Facility-Administered Medications on File Prior to Visit  Medication Dose Route Frequency Provider Last Rate Last Dose  . 0.9 %  sodium chloride infusion  500 mL Intravenous Once Ladene Artist, MD        PAST MEDICAL HISTORY: Past Medical History:  Diagnosis Date  . ALLERGIC RHINITIS 04/22/2007  . Allergy   . Cancer (HCC)    HX SKIN CANCER  . Complication of anesthesia    more concerned about positioning of head & neck because of TMJ  . DDD (degenerative disc disease), cervical   . GERD (gastroesophageal reflux disease)    infrequently  . Headache(784.0)    migraines  . History of kidney stones   . History of skin cancer   . History of TMJ disorder   . HYPERCHOLESTEROLEMIA 04/22/2007  . HYPERLIPIDEMIA 07/29/2007  . Hyperplastic colon polyp   . NEPHROLITHIASIS, HX OF 07/29/2007  . Osteoarth NOS-Unspec 04/22/2007  . Shortness of breath   . Shoulder pain, acute    bilateral    PAST SURGICAL HISTORY: Past Surgical History:  Procedure Laterality Date  . APPENDECTOMY    . BACK SURGERY  10/07/2015  . COLONOSCOPY     10 yrs ago- 2 HPP   . JOINT REPLACEMENT    . KNEE ARTHROSCOPY     right x2  . SHOULDER SURGERY     right  . skin cancer biopsy     L forehead  . TOTAL KNEE ARTHROPLASTY Right 08/25/2013   Procedure: RIGHT TOTAL KNEE ARTHROPLASTY;  Surgeon: Gearlean Alf, MD;  Location: [facility];  Service: Orthopedics;  Laterality: Right;  . TOTAL SHOULDER ARTHROPLASTY Left 12/25/2013   DR SUPPLE   . TOTAL SHOULDER ARTHROPLASTY Left 12/25/2013   Procedure: LEFT TOTAL SHOULDER ARTHROPLASTY;  Surgeon: Marin Shutter, MD;  Location: [facility];  Service: Orthopedics;  Laterality: Left;  . TOTAL SHOULDER ARTHROPLASTY Right 06/18/2014   DR SUPPLE   . TOTAL SHOULDER ARTHROPLASTY Right 06/18/2014   Procedure: RIGHT TOTAL SHOULDER ARTHROPLASTY;  Surgeon: Marin Shutter, MD;  Location: [facility];  Service: Orthopedics;  Laterality: Right;  . WISDOM TOOTH EXTRACTION      SOCIAL HISTORY: Social History   Tobacco Use  . Smoking status: Never Smoker  . Smokeless tobacco: Never Used  Substance Use Topics  . Alcohol use:  No    Alcohol/week: 0.0 oz  . Drug use: No    FAMILY HISTORY: Family History  Problem Relation Age of Onset  . COPD Father        family hx  . Emphysema Father   . Aneurysm Mother   . Stroke Mother   . Sudden death Mother   . Cancer Mother   . Obesity Mother   . Breast cancer Mother   . Cancer Maternal Grandfather        lung  . Colon cancer Neg Hx   . Colon polyps Neg Hx     ROS: Review of Systems  Constitutional: Negative for weight loss.  Cardiovascular: Negative for chest pain and claudication.  Musculoskeletal: Negative for myalgias.    PHYSICAL EXAM: Blood pressure 101/69, pulse 98, temperature 98.2 F (36.8 C), temperature source Oral, height 5\' 9"  (1.753 m), weight 204 lb (92.5 kg), SpO2 92 %. Body mass index is 30.13 kg/m. Physical Exam  Constitutional: He is oriented to person, place, and time. He appears well-developed and well-nourished.  Cardiovascular: Normal rate.  Pulmonary/Chest: Effort normal.  Musculoskeletal: Normal range of motion.  Neurological: He is oriented to person, place, and time.  Skin: Skin is warm and dry.  Psychiatric: He has a normal mood and affect. His behavior is normal.  Vitals reviewed.   RECENT LABS AND TESTS: BMET    Component Value Date/Time   NA 140 11/21/2017 0819   K 4.4 11/21/2017 0819   CL 103 11/21/2017 0819   CO2 21 11/21/2017 0819   GLUCOSE 93 11/21/2017 0819   GLUCOSE 94 07/31/2014 0920   GLUCOSE 94 08/20/2006 0830   BUN 25 11/21/2017 0819   CREATININE 1.14 11/21/2017 0819   CALCIUM 9.3 11/21/2017 0819   GFRNONAA 67 11/21/2017 0819    GFRAA 77 11/21/2017 0819   Lab Results  Component Value Date   HGBA1C 5.5 11/21/2017   HGBA1C 5.8 (H) 06/18/2017   Lab Results  Component Value Date   INSULIN 8.6 11/21/2017   INSULIN 16.5 06/18/2017   CBC    Component Value Date/Time   WBC 5.5 06/18/2017 1047   WBC 5.7 07/31/2014 0920   RBC 4.60 06/18/2017 1047   RBC 4.53 07/31/2014 0920   HGB 15.7 06/18/2017 1047   HCT 45.0 06/18/2017 1047   PLT 313.0 07/31/2014 0920   MCV 98 (H) 06/18/2017 1047   MCH 34.1 (H) 06/18/2017 1047   MCH 31.8 06/10/2014 0955   MCHC 34.9 06/18/2017 1047   MCHC 33.4 07/31/2014 0920   RDW 14.3 06/18/2017 1047   LYMPHSABS 1.8 06/18/2017 1047   MONOABS 0.5 07/31/2014 0920   EOSABS 0.3 06/18/2017 1047   BASOSABS 0.1 06/18/2017 1047   Iron/TIBC/Ferritin/ %Sat No results found for: IRON, TIBC, FERRITIN, IRONPCTSAT Lipid Panel     Component Value Date/Time   CHOL 239 (H) 11/21/2017 0819   TRIG 139 11/21/2017 0819   TRIG 52 08/20/2006 0830   HDL 62 11/21/2017 0819   CHOLHDL 4 07/31/2014 0920   VLDL 24.4 07/31/2014 0920   LDLCALC 149 (H) 11/21/2017 0819   LDLDIRECT 135.0 07/16/2013 0937   Hepatic Function Panel     Component Value Date/Time   PROT 7.0 11/21/2017 0819   ALBUMIN 4.3 11/21/2017 0819   AST 40 11/21/2017 0819   ALT 40 11/21/2017 0819   ALKPHOS 46 11/21/2017 0819   BILITOT 0.5 11/21/2017 0819   BILIDIR 0.1 07/31/2014 0920      Component Value Date/Time  TSH 2.920 06/18/2017 1047   TSH 1.53 07/31/2014 0920   TSH 1.85 07/16/2013 0937    ASSESSMENT AND PLAN: Other hyperlipidemia  Class 1 obesity with serious comorbidity and body mass index (BMI) of 30.0 to 30.9 in adult, unspecified obesity type  PLAN:  Hyperlipidemia Harper was informed of the American Heart Association Guidelines emphasizing intensive lifestyle modifications as the first line treatment for hyperlipidemia. We discussed many lifestyle modifications today in depth, and Colten will continue to work on  decreasing saturated fats such as fatty red meat, butter and many fried foods. He will continue also increase vegetables and lean protein in his diet and continue to work on diet, exercise, and weight loss efforts. We will recheck labs in 1 month and Odie agrees to follow up with our clinic in 4 weeks.   We spent > than 50% of the 30 minute visit on the counseling as documented in the note.  Obesity Shayde is currently in the action stage of change. As such, his goal is to continue with weight loss efforts He has agreed to keep a food journal with 2500 calories and 150+ grams of protein daily Zymire has been instructed to work up to a goal of 150 minutes of combined cardio and strengthening exercise per week for weight loss and overall health benefits. We discussed the following Behavioral Modification Strategies today: increasing lean protein intake, decreasing simple carbohydrates  and work on meal planning and easy cooking plans   Zamar has agreed to follow up with our clinic in 4 weeks. He was informed of the importance of frequent follow up visits to maximize his success with intensive lifestyle modifications for his multiple health conditions.   OBESITY BEHAVIORAL INTERVENTION VISIT  Today's visit was # 11 out of 22.  Starting weight: 230 lbs Starting date: 06/18/17 Today's weight : 204 lbs Today's date: 02/20/2018 Total lbs lost to date: 30 (Patients must lose 7 lbs in the first 6 months to continue with counseling)   ASK: We discussed the diagnosis of obesity with [name] today and [name] agreed to give [name] permission to discuss obesity behavioral modification therapy today.  ASSESS: Ethon has the diagnosis of obesity and his BMI today is 30.11 Goro is in the action stage of change   ADVISE: Crews was educated on the multiple health risks of obesity as well as the benefit of weight loss to improve his health. He was advised of the need for long term treatment and the  importance of lifestyle modifications.  AGREE: Multiple dietary modification options and treatment options were discussed and  Sirius agreed to the above obesity treatment plan.  I, Trixie Dredge, am acting as transcriptionist for Dennard Nip, MD  I have reviewed the above documentation for accuracy and completeness, and I agree with the above. -Dennard Nip, MD

## 2018-03-19 ENCOUNTER — Ambulatory Visit (INDEPENDENT_AMBULATORY_CARE_PROVIDER_SITE_OTHER): Payer: Medicare HMO | Admitting: Family Medicine

## 2018-03-20 ENCOUNTER — Ambulatory Visit: Payer: Medicare HMO | Admitting: Neurology

## 2018-03-20 ENCOUNTER — Ambulatory Visit (INDEPENDENT_AMBULATORY_CARE_PROVIDER_SITE_OTHER): Payer: Medicare HMO | Admitting: Family Medicine

## 2018-03-20 ENCOUNTER — Encounter: Payer: Self-pay | Admitting: Neurology

## 2018-03-20 ENCOUNTER — Telehealth: Payer: Self-pay | Admitting: *Deleted

## 2018-03-20 VITALS — BP 134/83 | HR 71 | Ht 69.0 in | Wt 222.0 lb

## 2018-03-20 DIAGNOSIS — H9319 Tinnitus, unspecified ear: Secondary | ICD-10-CM | POA: Insufficient documentation

## 2018-03-20 DIAGNOSIS — E669 Obesity, unspecified: Secondary | ICD-10-CM | POA: Diagnosis not present

## 2018-03-20 DIAGNOSIS — G43711 Chronic migraine without aura, intractable, with status migrainosus: Secondary | ICD-10-CM

## 2018-03-20 DIAGNOSIS — H9313 Tinnitus, bilateral: Secondary | ICD-10-CM

## 2018-03-20 MED ORDER — AMITRIPTYLINE HCL 25 MG PO TABS: ORAL_TABLET | ORAL | 3 refills | Status: DC

## 2018-03-20 MED ORDER — TOPIRAMATE ER 25 MG PO CAP24: 100.0000 mg | ORAL_CAPSULE | Freq: Every day | ORAL | 0 refills | Status: DC

## 2018-03-20 MED ORDER — AMITRIPTYLINE HCL 25 MG PO TABS: ORAL_TABLET | ORAL | 6 refills | Status: DC

## 2018-03-20 MED ORDER — ERENUMAB-AOOE 70 MG/ML ~~LOC~~ SOAJ: 70.0000 mg | SUBCUTANEOUS | 0 refills | Status: DC

## 2018-03-20 NOTE — Progress Notes (Signed)
[facility]    Provider:  Dr Jaynee Eagles Referring Provider: Marletta Lor, MD Primary Care Physician:  Marletta Lor, MD  CC:  Chronic Migraines  Interval history 03/20/2018: Here for follow up of migraines on Aimovig, Gralise, elavil, cymbalta, triptans, tizanidine. He was doing exceptionally well but he had a colonoscopy that worsened his back pain  And also worsened his migraines. He declines botox for migraine, have discussed with him a few times. He does not want botox. He is having difficulty with weight loss despite physical activity and a good diet. Advised TCAs can cause this as can gabapentin. Will try to decrease his amitriptyline. Then start Topiramate ER and decrease neurontin.  Reviewed headache diary: Total headache days a month:  January 2 February 16 March 17 Apil 5 May 5 June 9  Tried: Amitriptyline, Topamax, Cymbalta, Rizatriptan, Aimovig, Melatonin, Coffe before bed (possibly hypnic headaches), Depakote (swelling allergy), Gralise, Tizanidine, Naproxen, flexeril, methocarbamol,   Interval history 11/20/2017: Since starting Gralise and aimovig he is doing extremely well. In January had 2 migraines, in February had 4 days of migraines but otherwise none. The migraines are not as long and not as intense. Reduced intensity. We were having a lot of difficulty getting Aimovig approved, he spent hours on the phone. Gave him samples today. He is doing spectacularly on Aimovig. He takes Tizanidine and Amitriptyline at bedtime. He takes Gralise with food. He continues to [facility], he lost 20 pounds. He has been walking, walking everyday. Continue one injection.   Interval history 07/23/2017:  SHARON STAPEL is a 67 y.o. male here as a referral from Dr. Burnice Logan for Migraines. They are improved on Amitriptyline, Gralise and Aimovig. The intensity and duration are improved as well as the frequency. Also helping him  sleep. However making him more tired during the early part of the day. He has a "lot of pain" in other places around his body, pain in his right thumb and index finger, he has had multiple orthopaedic operations. They moved into a new house. He has back pain, knee pain and thumb pain. He carried a lot of boxes during the move which exacerbated his symptoms. He feels like his weight is not improving, discussed these medications have weight gain.  He saw [name] a month ago, Reviewed his migraine diary in detail, talked in length about his migraines which have decreased in frequency by 48% and 36.7% in frequency however his migraine severity has reduced by 50% as did it.   Interval history 04/26/2017: patient is following for nocturnal and morning headaches. Has been diagnosed with sleep apnea but refuses CPAP machine and despite discussions that this could be the cause of his headaches. These tried multiple medications as below, we've tried treatments for migraines as well as possibly hypnic headaches. We've tried the new CGRP monoclonal antibody Aimovig. He has had a migraine every day for the last 2 weeks. Happening at night about 1/2 the time and now more during the day associated with back pain. He is taking melatonin, amitriptyline, cymbalta and flexeril at night. Recommend seeing Dr. Rexene Alberts to discuss sleep study in detail which showed sleep apnea.   Interval history 01/04/2017:Patient following up for morning headaches. MRI brain was normal. Patient has OSA diagnosed here at [facility] but he declined to be treated despite headaches waking him up at night. I have highly encouraged him to use a cpap as this may resolve his nocturnal and morning headaches  and also warned of the sequelae of untreated sleep apnea such as increased risk of stroke. He takes tylenol PM every night, cymbalta, He is on Amitriptyline 75mg  at night. He has been to PT for his musculoskeletal neck pain.   Tried: Amitriptyline,  Topamax, Cymbalta, Rizatriptan, Aimovig, Melatonin, Coffe before bed (possibly hypnic headaches), Depakote (swelling allergy), Gralise, Tizanidine  Has not tried: Depakote, propranolol, Indomethacin,   GYJ:EHUDJ [name] a 67 y.o.malehere as a referral from Dr. Raynelle Chary migraines. PMHx migraines. He has had migraines/headachesfor 10 years. He travels a lot and has significant osteoarthritis. He has had shoulder replacements and back fusion of l4-l5. His skeletal issues are a migraine trigger. All his migraines are in the middle of the night, he wakes up with headaches 95% of the time. He has seen a lot of doctors. Sumatriptan helps with the migraines. He has been to many doctors. He saw Dr. Melton Alar for headaches. He has daily headaches. He was taking Sumatriptan daily but now only 9 times a month and no medication overuse. Headaches start in the neck, feels like he is going to explode, all over the head, so severe he can't even look at things, painful pressure all over the head, in his eyes behind his eyes, stiffness and tense, his head is pounding, nausea, extreme light and sound sensitivity. He snores, he doesn't wake up from snoring. No aura. He will also get migraines during the day if he is not conscious of his posture. Migraines last 8 hours. He is terrified to go to bed at night because he wakes up with headaches. No vision changes, speech changes, new weakness or sensory changes. No other focal neurologic deficits, associated symptoms, inciting events or modifiable factors.  Tried: Topiramate, Elavil, acetaminophen, imitrex   Reviewed notes, labs and imaging from outside physicians, which showed:  Reviewed primary care notes and notes from [facility]. Patient has a history of migraines. Migraines have been refractory. These responded nicely to sumatriptan but his insurance company has switched to an alternate triptan and he is quite limited in his monthly  supply. He is under considerable stress recently. He tried Topamax which she did not tolerate well and recently discontinued. He is previous s/p posterior lumbar interbody fusion at L4-L5 performed on 10-07-15 with noted degenerative changes at L3-L4 with collapse to the right and noted degenerative scoliosis. He has right low back pain and right hip pain that is constant and it hurts in any position. He denies any radicular pain or numbness or weakness. He has a progressive collapse of the disc space at L3-4 that is eccentric and now accentuating the lateral listhesis at L2-3. The MR shows obvious endplate inflammation as well so they will start with ESI efforts and see if this helps.If not, he will need an extension of his fusion with correciton of the deformity up to L2.Marland Kitchen  MRI LUMBAR SPINE WITHOUT CONTRAST, 08/16/2016 10:51 AM   INDICATION: LOW BACK PAIN, >6 WKS / RED FLAG(S) / RADICULOPATHY \ \ Z98.1 S/P lumbar fusion \ M54.5 Spine pain, lumbar   COMPARISON: Lumbar spine radiograph 07/18/2016 and CT myelogram 07/12/2015   TECHNIQUE: Multiplanar, multi-sequence surface-coil MR imaging of the lumbar spine was performed without contrast.   LEVELS IMAGED: Lower thoracic to upper sacral region.   FINDINGS:  Alignment: Similar grade 1 anterolisthesis of L4 and L5. Trace retrolisthesis of L2 on L3, similar to prior.Similar levoscoliotic curvature of the spine. Vertebrae: There are fatty endplate degenerative changes at L4-L5. Increased  T2/STIR signal within the endplates at S0-Y3 and the inferior endplate of L2, compatible with edema/inflammation, likely degenerative in etiology. Redemonstrated postsurgical changes of posterior lumbar interbody fusion at L4-L5 with pedicle screw and rod internal fixation. Paraspinal soft tissues: Increased T2/STIR signal within the paraspinal musculature adjacent to the operative site, which is favored to reflect postsurgical changes. Conus: Normal in signal and  position, terminating at the superior aspect of L2  T12-L1: No significant focal abnormality. L1-L2: No significant focal abnormality. L2-L3: Similar trace retrolisthesis of L2 on L3 with slight uncovering of the disc. There is a left eccentric disc bulge with disc desiccation and height loss. Facet hypertrophy with ligamentum flavum thickening and facet effusions bilaterally. There is mild bilateral foraminal narrowing L3-L4: There is marked narrowing of the right aspect of the disc space at L3-4 with surrounding high STIR, low T1 signal compatible with edema, likely related to progressive degenerative changes at this level. Broad-based disc bulge with facet hypertrophy and ligamentum flavum thickening. There is moderate right lateral recess narrowing. There is moderate right foraminal narrowing and mild left foraminal narrowing. L4-L5: Grade one anterolisthesis of L4 and L5, with slight uncovering of the disc. There is facet hypertrophy and ligamentum flavum thickening without canal narrowing. There is soft tissue extending into the foramen on the right at L4-L5.  L5-S1: Left eccentric disc bulge. There is moderate left foraminal narrowing. No significant canal narrowing.    Review of Systems: Patient complains of symptoms per HPI as well as the following symptoms:No CP, no SOB. Pertinent negatives per HPI. All others negative.    Social History   Socioeconomic History  . Marital status: Married    Spouse name: Not on file  . Number of children: 1  . Years of education: 16+  . Highest education level: Bachelor's degree (e.g., BA, AB, BS)  Occupational History  . Occupation: Retired  Scientific laboratory technician  . Financial resource strain: Not on file  . Food insecurity:    Worry: Not on file    Inability: Not on file  . Transportation needs:    Medical: Not on file    Non-medical: Not on file  Tobacco Use  . Smoking status: Never Smoker  . Smokeless tobacco: Never Used  Substance  and Sexual Activity  . Alcohol use: No    Alcohol/week: 0.0 oz  . Drug use: No  . Sexual activity: Yes    Partners: Female    Birth control/protection: Implant  Lifestyle  . Physical activity:    Days per week: Not on file    Minutes per session: Not on file  . Stress: Not on file  Relationships  . Social connections:    Talks on phone: Not on file    Gets together: Not on file    Attends religious service: Not on file    Active member of club or organization: Not on file    Attends meetings of clubs or organizations: Not on file    Relationship status: Not on file  . Intimate partner violence:    Fear of current or ex partner: Not on file    Emotionally abused: Not on file    Physically abused: Not on file    Forced sexual activity: Not on file  Other Topics Concern  . Not on file  Social History Narrative   Lives at home w/ his wife   Right-handed   Caffeine: 2 cups of coffee each morning    Family History  Problem Relation  Age of Onset  . COPD Father        family hx  . Emphysema Father   . Aneurysm Mother   . Stroke Mother   . Sudden death Mother   . Cancer Mother   . Obesity Mother   . Breast cancer Mother   . Cancer Maternal Grandfather        lung  . Colon cancer Neg Hx   . Colon polyps Neg Hx     Past Medical History:  Diagnosis Date  . ALLERGIC RHINITIS 04/22/2007  . Allergy   . Cancer (HCC)    HX SKIN CANCER  . Complication of anesthesia    more concerned about positioning of head & neck because of TMJ  . DDD (degenerative disc disease), cervical   . GERD (gastroesophageal reflux disease)    infrequently  . Headache(784.0)    migraines  . History of kidney stones   . History of skin cancer   . History of TMJ disorder   . HYPERCHOLESTEROLEMIA 04/22/2007  . HYPERLIPIDEMIA 07/29/2007  . Hyperplastic colon polyp   . NEPHROLITHIASIS, HX OF 07/29/2007  . Osteoarth NOS-Unspec 04/22/2007  . Shortness of breath   . Shoulder pain, acute    bilateral     Past Surgical History:  Procedure Laterality Date  . APPENDECTOMY    . BACK SURGERY  10/07/2015  . COLONOSCOPY     10 yrs ago- 2 HPP   . JOINT REPLACEMENT    . KNEE ARTHROSCOPY     right x2  . SHOULDER SURGERY     right  . skin cancer biopsy     L forehead  . TOTAL KNEE ARTHROPLASTY Right 08/25/2013   Procedure: RIGHT TOTAL KNEE ARTHROPLASTY;  Surgeon: Gearlean Alf, MD;  Location: [facility];  Service: Orthopedics;  Laterality: Right;  . TOTAL SHOULDER ARTHROPLASTY Left 12/25/2013   DR SUPPLE   . TOTAL SHOULDER ARTHROPLASTY Left 12/25/2013   Procedure: LEFT TOTAL SHOULDER ARTHROPLASTY;  Surgeon: Marin Shutter, MD;  Location: [facility];  Service: Orthopedics;  Laterality: Left;  . TOTAL SHOULDER ARTHROPLASTY Right 06/18/2014   DR SUPPLE  . TOTAL SHOULDER ARTHROPLASTY Right 06/18/2014   Procedure: RIGHT TOTAL SHOULDER ARTHROPLASTY;  Surgeon: Marin Shutter, MD;  Location: [facility];  Service: Orthopedics;  Laterality: Right;  . WISDOM TOOTH EXTRACTION      Current Outpatient Medications  Medication Sig Dispense Refill  . allopurinol (ZYLOPRIM) 100 MG tablet TAKE 1 TABLET EVERY DAY 90 tablet 3  . amitriptyline (ELAVIL) 100 MG tablet Take 1 tablet (100 mg total) by mouth at bedtime. 90 tablet 3  . aspirin 81 MG tablet Take 81 mg by mouth daily.    . Cholecalciferol (VITAMIN D PO) Take 1 tablet by mouth daily.    . Coenzyme Q10 (CO Q 10 PO) Take by mouth daily.    . Cyanocobalamin (VITAMIN B 12 PO) Take by mouth.    . DULoxetine (CYMBALTA) 60 MG capsule TAKE 1 CAPSULE BY MOUTH DAILY 30 capsule 9  . EPINEPHRINE 0.3 mg/0.3 mL IJ SOAJ injection INJECT 0.3 MLS INTO THE MUSCLE ONCE 2 mL 1  . Erenumab-aooe (AIMOVIG) 70 MG/ML SOAJ Inject 1 Syringe into the skin every 30 (thirty) days. 2 pen 0  . Erenumab-aooe (AIMOVIG) 70 MG/ML SOAJ Inject 70 mg into the skin every 30 (thirty) days. 6 pen 0  . Gabapentin, Once-Daily, (GRALISE) 300 MG TABS Take 900 mg by mouth every evening. (Patient taking  differently: Take 600  mg by mouth every evening. ) 180 tablet 11  . Gabapentin, Once-Daily, (GRALISE) 300 MG TABS Take 600 mg by mouth every evening. 57 tablet 0  . Multiple Vitamin (MULTIVITAMIN) tablet Take 1 tablet by mouth daily.    . naproxen (NAPROSYN) 500 MG tablet Take 500 mg by mouth 2 (two) times daily with a meal.    . Omega-3 Fatty Acids (FISH OIL) 1000 MG CAPS Take 1 capsule by mouth daily.    . pravastatin (PRAVACHOL) 40 MG tablet TAKE 1 TABLET EVERY DAY 90 tablet 3  . Psyllium (METAMUCIL PO) Take 2 tablets by mouth 2 (two) times daily.    . Red Yeast Rice Extract (RED YEAST RICE PO) Take 1 tablet by mouth daily.    . rizatriptan (MAXALT) 10 MG tablet Take 1 tablet (10 mg total) by mouth as needed for migraine. May repeat in 2 hours if needed 30 tablet 4  . SUMAtriptan (IMITREX) 100 MG tablet TAKE 1 TABLET EVERY 2 HOURS AS NEEDED FOR MIGRAINE OR HEADACHE 9 tablet 5  . tiZANidine (ZANAFLEX) 4 MG capsule Take 4 mg by mouth 3 (three) times daily as needed.     . TURMERIC CURCUMIN PO Take 1,050 mg by mouth daily.    . valACYclovir (VALTREX) 500 MG tablet Take 500 mg by mouth daily.    . vitamin C (ASCORBIC ACID) 500 MG tablet Take 500 mg by mouth daily.    . vitamin E 1000 UNIT capsule Take 1,000 Units by mouth daily.     Current Facility-Administered Medications  Medication Dose Route Frequency Provider Last Rate Last Dose  . 0.9 %  sodium chloride infusion  500 mL Intravenous Once Ladene Artist, MD        Allergies as of 03/20/2018 - Review Complete 02/20/2018  Allergen Reaction Noted  . Shrimp [shellfish allergy] Anaphylaxis and Rash 05/22/2012  . Codeine Rash and Other (See Comments)   . Contrast media [iodinated diagnostic agents] Rash 11/11/2015  . Diazepam Rash 06/26/2014  . Amoxicillin Other (See Comments)   . Benadryl [diphenhydramine]  10/23/2017  . Depakote [divalproex sodium] Swelling 07/30/2017  . Erythromycin Other (See Comments)   . Flexeril  [cyclobenzaprine]  10/23/2017  . Betadine [povidone iodine] Rash 05/22/2012  . Celebrex [celecoxib] Rash 05/22/2012  . Meperidine and related Rash 05/22/2012  . Methocarbamol Rash 05/22/2012  . Neosporin [neomycin-bacitracin zn-polymyx] Rash 10/23/2017  . Oxycodone Rash 10/14/2015  . Penicillins Rash   . Plavix [clopidogrel] Rash 12/11/2013  . Rivaroxaban Rash 10/20/2013  . Vioxx [rofecoxib] Rash 05/22/2012    Vitals: There were no vitals taken for this visit. Last Weight:  Wt Readings from Last 1 Encounters:  02/20/18 204 lb (92.5 kg)   Last Height:   Ht Readings from Last 1 Encounters:  02/20/18 5\' 9"  (1.753 m)   Physical exam: Exam: Gen: NAD, conversant, well nourised, obese, well groomed                     CV: RRR, no MRG. No Carotid Bruits. No peripheral edema, warm, nontender Eyes: Conjunctivae clear without exudates or hemorrhage  Neuro: Detailed Neurologic Exam  Speech:    Speech is normal; fluent and spontaneous with normal comprehension.  Cognition:    The patient is oriented to person, place, and time;     recent and remote memory intact;     language fluent;     normal attention, concentration,     fund of knowledge Cranial Nerves:  The pupils are equal, round, and reactive to light. The fundi are normal and spontaneous venous pulsations are present. Visual fields are full to finger confrontation. Extraocular movements are intact. Trigeminal sensation is intact and the muscles of mastication are normal. The face is symmetric. The palate elevates in the midline. Hearing intact. Voice is normal. Shoulder shrug is normal. The tongue has normal motion without fasciculations.   Coordination:    Normal finger to nose and heel to shin. Normal rapid alternating movements.   Gait:    Heel-toe and tandem gait are normal.   Motor Observation:    No asymmetry, no atrophy, and no involuntary movements noted. Tone:    Normal muscle tone.    Posture:    Posture  is normal. normal erect    Strength:    Strength is V/V in the upper and lower limbs.      Sensation: intact to LT     Reflex Exam:  DTR's:    Deep tendon reflexes in the upper and lower extremities are normal bilaterally.   Toes:    The toes are downgoing bilaterally.   Clonus:    Clonus is absent.        Assessment/Plan:68 year old with chronic migraines. Significantly improved on Aimovig but worsened the last few months in the setting of back pain and stress.  - Continue Aimovig, had long discussion with patient  - [facility] He is struggling. His TCA and gabapentin may have something to do with his struggles, they can cause weight gain. Will try and transition to Trokendi. Decrease Amitriptyline.  Previous A/P:  Currently on 4 migraine preventatives: Cymbalta, Amitriptyline, Gralise and Aimovig. Has failed Topiramate (Tried: Amitriptyline, Topamax, Cymbalta, Rizatriptan, Aimovig, Melatonin, Coffe before bed (possibly hypnic headaches), : Tried: Amitriptyline, Topamax, Cymbalta, Rizatriptan, Aimovig, Melatonin, Coffe before bed (possibly hypnic headaches), Depakote (swelling allergy), Gralise, Tizanidine  Declines botox for migraines  Acute medication for migraines: Continue Sumatriptan/rizatriptan. Discussed medication overuse and not to take acute medications more than 10 days a month for migraines.   MRI brain wo contrast: normal  He has been to [facility] and did not have a good experience, declines further PT  New problem: Tinnitus. May have coincided with taking Naproxen, asked him to stop Naproxen.   ENT: Dr. Ernesto Rutherford for Tinnitus  Difficulty with weight: Elavil and Gabapentin(Gralise) can cause weight gain. Will try and transition to Trokendi.  Sarina Ill, MD  [facility] [address] [place], [place] [address]  Phone [phone] Fax [phone]  A total of 40 minutes  was spent face-to-face with this patient. Over half this time was spent on counseling patient on the chronic migraine, tinnitus, obesity  diagnosis and different diagnostic and therapeutic options available.

## 2018-03-20 NOTE — Telephone Encounter (Signed)
Pt has migraine today while in office. Dr. Jaynee Eagles spoke with infusion and they will give pt a migraine cocktail today.   Ordered: Depacon 1 gram IV x 1 (per Dr. Jaynee Eagles pt had this last time) Toradol 30 mg IV x 1  Solumedrol 250 mg IV x 1   Infusion has orders.

## 2018-03-20 NOTE — Patient Instructions (Addendum)
Decrease the Amitriptyline as discussed When on a stable dose of Amitriptyline initiate Topiramate: 25mg  x 1 week 50 mg x 1 week 75mg  x 1 week 100mg  x 1week  Stay in touch as we titrate if yo have side effects hold dose and do not increase further   Topiramate extended-release capsules What is this medicine? TOPIRAMATE (toe PYRE a mate) is used to treat seizures in adults or children with epilepsy. It is also used for the prevention of migraine headaches. This medicine may be used for other purposes; ask your health care provider or pharmacist if you have questions. COMMON BRAND NAME(S): Trokendi XR What should I tell my health care provider before I take this medicine? They need to know if you have any of these conditions: -cirrhosis of the liver or liver disease -diarrhea -glaucoma -kidney stones or kidney disease -lung disease like asthma, obstructive pulmonary disease, emphysema -metabolic acidosis -on a ketogenic diet -scheduled for surgery or a procedure -suicidal thoughts, plans, or attempt; a previous suicide attempt by you or a family member -an unusual or allergic reaction to topiramate, other medicines, foods, dyes, or preservatives -pregnant or trying to get pregnant -breast-feeding How should I use this medicine? Take this medicine by mouth with a glass of water. Follow the directions on the prescription label. Trokendi XR capsules must be swallowed whole. Do not sprinkle on food, break, crush, dissolve, or chew. Qudexy XR capsules may be swallowed whole or opened and sprinkled on a small amount of soft food. This mixture must be swallowed immediately. Do not chew or store mixture for later use. You may take this medicine with meals. Take your medicine at regular intervals. Do not take it more often than directed. Talk to your pediatrician regarding the use of this medicine in children. Special care may be needed. While Trokendi XR may be prescribed for children as young  as 6 years and Qudexy XR may be prescribed for children as young as 2 years for selected conditions, precautions do apply. Overdosage: If you think you have taken too much of this medicine contact a poison control center or emergency room at once. NOTE: This medicine is only for you. Do not share this medicine with others. What if I miss a dose? If you miss a dose, take it as soon as you can. If it is almost time for your next dose, take only that dose. Do not take double or extra doses. What may interact with this medicine? Do not take this medicine with any of the following medications: -probenecid This medicine may also interact with the following medications: -acetazolamide -alcohol -amitriptyline -birth control pills -digoxin -hydrochlorothiazide -lithium -medicines for pain, sleep, or muscle relaxation -metformin -methazolamide -other seizure or epilepsy medicines -pioglitazone -risperidone This list may not describe all possible interactions. Give your health care provider a list of all the medicines, herbs, non-prescription drugs, or dietary supplements you use. Also tell them if you smoke, drink alcohol, or use illegal drugs. Some items may interact with your medicine. What should I watch for while using this medicine? Visit your doctor or health care professional for regular checks on your progress. Do not stop taking this medicine suddenly. This increases the risk of seizures if you are using this medicine to control epilepsy. Wear a medical identification bracelet or chain to say you have epilepsy or seizures, and carry a card that lists all your medicines. This medicine can decrease sweating and increase your body temperature. Watch for signs of deceased sweating  or fever, especially in children. Avoid extreme heat, hot baths, and saunas. Be careful about exercising, especially in hot weather. Contact your health care provider right away if you notice a fever or decrease in  sweating. You should drink plenty of fluids while taking this medicine. If you have had kidney stones in the past, this will help to reduce your chances of forming kidney stones. If you have stomach pain, with nausea or vomiting and yellowing of your eyes or skin, call your doctor immediately. You may get drowsy, dizzy, or have blurred vision. Do not drive, use machinery, or do anything that needs mental alertness until you know how this medicine affects you. To reduce dizziness, do not sit or stand up quickly, especially if you are an older patient. Alcohol can increase drowsiness and dizziness. Avoid alcoholic drinks. Do not drink alcohol for 6 hours before or 6 hours after taking Trokendi XR. If you notice blurred vision, eye pain, or other eye problems, seek medical attention at once for an eye exam. The use of this medicine may increase the chance of suicidal thoughts or actions. Pay special attention to how you are responding while on this medicine. Any worsening of mood, or thoughts of suicide or dying should be reported to your health care professional right away. This medicine may increase the chance of developing metabolic acidosis. If left untreated, this can cause kidney stones, bone disease, or slowed growth in children. Symptoms include breathing fast, fatigue, loss of appetite, irregular heartbeat, or loss of consciousness. Call your doctor immediately if you experience any of these side effects. Also, tell your doctor about any surgery you plan on having while taking this medicine since this may increase your risk for metabolic acidosis. Birth control pills may not work properly while you are taking this medicine. Talk to your doctor about using an extra method of birth control. Women who become pregnant while using this medicine may enroll in the [place] Pregnancy Registry by calling [phone]. This registry collects information about the safety of  antiepileptic drug use during pregnancy. What side effects may I notice from receiving this medicine? Side effects that you should report to your doctor or health care professional as soon as possible: -allergic reactions like skin rash, itching or hives, swelling of the face, lips, or tongue -decreased sweating and/or rise in body temperature -depression -difficulty breathing, fast or irregular breathing patterns -difficulty speaking -difficulty walking or controlling muscle movements -hearing impairment -redness, blistering, peeling or loosening of the skin, including inside the mouth -tingling, pain or numbness in the hands or feet -unusually weak or tired -worsening of mood, thoughts or actions of suicide or dying Side effects that usually do not require medical attention (report to your doctor or health care professional if they continue or are bothersome): -altered taste -back pain, joint or muscle aches and pains -diarrhea, or constipation -headache -loss of appetite -nausea -stomach upset, indigestion -tremors This list may not describe all possible side effects. Call your doctor for medical advice about side effects. You may report side effects to FDA at 1-800-FDA-1088. Where should I keep my medicine? Keep out of the reach of children. Store at room temperature between 15 and 30 degrees C (59 and 86 degrees F) in a tightly closed container. Protect from moisture. Throw away any unused medicine after the expiration date. NOTE: This sheet is a summary. It may not cover all possible information. If you have questions about this medicine, talk to your  doctor, pharmacist, or health care provider.  2018 Elsevier/Gold Standard (2015-12-31 12:33:11)

## 2018-04-16 ENCOUNTER — Ambulatory Visit (INDEPENDENT_AMBULATORY_CARE_PROVIDER_SITE_OTHER): Payer: Medicare HMO | Admitting: Family Medicine

## 2018-04-19 ENCOUNTER — Other Ambulatory Visit: Payer: Self-pay | Admitting: Neurology

## 2018-04-19 DIAGNOSIS — M25572 Pain in left ankle and joints of left foot: Secondary | ICD-10-CM | POA: Diagnosis not present

## 2018-04-24 ENCOUNTER — Encounter: Payer: Self-pay | Admitting: Neurology

## 2018-04-24 ENCOUNTER — Encounter (INDEPENDENT_AMBULATORY_CARE_PROVIDER_SITE_OTHER): Payer: Self-pay | Admitting: Family Medicine

## 2018-05-01 DIAGNOSIS — M79644 Pain in right finger(s): Secondary | ICD-10-CM | POA: Diagnosis not present

## 2018-05-01 DIAGNOSIS — M25572 Pain in left ankle and joints of left foot: Secondary | ICD-10-CM | POA: Diagnosis not present

## 2018-05-02 ENCOUNTER — Encounter: Payer: Self-pay | Admitting: *Deleted

## 2018-05-07 ENCOUNTER — Other Ambulatory Visit: Payer: Self-pay | Admitting: Internal Medicine

## 2018-05-22 ENCOUNTER — Ambulatory Visit (INDEPENDENT_AMBULATORY_CARE_PROVIDER_SITE_OTHER): Payer: Medicare HMO | Admitting: Family Medicine

## 2018-05-22 VITALS — BP 113/77 | HR 95 | Temp 98.2°F | Ht 69.0 in | Wt 219.0 lb

## 2018-05-22 DIAGNOSIS — Z6832 Body mass index (BMI) 32.0-32.9, adult: Secondary | ICD-10-CM

## 2018-05-22 DIAGNOSIS — R7303 Prediabetes: Secondary | ICD-10-CM | POA: Diagnosis not present

## 2018-05-22 DIAGNOSIS — E669 Obesity, unspecified: Secondary | ICD-10-CM | POA: Diagnosis not present

## 2018-05-23 NOTE — Progress Notes (Signed)
Office: [phone]  /  Fax: [phone]   HPI:   Chief Complaint: OBESITY Reason is here to discuss his progress with his obesity treatment plan. He is on the keep a food journal with 2500 calories and 150+ grams of protein daily and is following his eating plan approximately 20 % of the time. He states he is walking 10,000 steps a day.. Samar's last visit was three months ago. He has struggled more with increased stress and health issues. His walking decreased with his recent foot injury, but he has started back to increasing exercise. He is a perfectionist and is disappointed that he didn't do better with his weight gain. Haim is ready to get back on track. His weight is 219 lb (99.3 kg) today and has not lost weight since his last visit. He has lost 11 lbs since starting treatment with [name].  Pre-Diabetes Harjot has a diagnosis of prediabetes based on his elevated Hgb A1c and was informed this puts him at greater risk of developing diabetes. Amahri is off track with diet and exercise and his A1c has likely increased. He is due for labs, but he requests to wait a month to get back on track before his labs are done. He is not taking metformin currently and continues to work on diet and exercise to decrease risk of diabetes. He denies nausea or hypoglycemia.  ALLERGIES: Allergies  Allergen Reactions   Shrimp [Shellfish Allergy] Anaphylaxis and Rash   Codeine Rash and Other (See Comments)    Blacked out   Contrast Media [Iodinated Diagnostic Agents] Rash   Diazepam Rash    Rash over whole body from neck down, and he lost his voice (airway compromise?)   Amoxicillin Other (See Comments)    Unknown    Benadryl [Diphenhydramine]     With dyes    Depakote [Divalproex Sodium] Swelling    Per pts report (given by Dr. Inda Merlin).  Has throat swelling and rash.   Erythromycin Other (See Comments)    unknown   Flexeril [Cyclobenzaprine]    Betadine [Povidone Iodine] Rash    Celebrex [Celecoxib] Rash   Meperidine And Related Rash   Methocarbamol Rash   Neosporin [Neomycin-Bacitracin Zn-Polymyx] Rash   Oxycodone Rash   Penicillins Rash   Plavix [Clopidogrel] Rash   Rivaroxaban Rash   Vioxx [Rofecoxib] Rash    MEDICATIONS: Current Outpatient Medications on File Prior to Visit  Medication Sig Dispense Refill   allopurinol (ZYLOPRIM) 100 MG tablet TAKE 1 TABLET EVERY DAY 90 tablet 3   amitriptyline (ELAVIL) 25 MG tablet Week one: 75mg  at bedtime Week two: 50mg  at bedtime Week three 25mg  at bedtime 90 tablet 6   aspirin 81 MG tablet Take 81 mg by mouth daily.     Cholecalciferol (VITAMIN D PO) Take 1 tablet by mouth daily.     Coenzyme Q10 (CO Q 10 PO) Take by mouth daily.     Cyanocobalamin (VITAMIN B 12 PO) Take by mouth.     DULoxetine (CYMBALTA) 60 MG capsule TAKE 1 CAPSULE BY MOUTH DAILY 30 capsule 9   EPINEPHRINE 0.3 mg/0.3 mL IJ SOAJ injection INJECT 0.3 MLS INTO THE MUSCLE ONCE 2 mL 1   Erenumab-aooe (AIMOVIG) 70 MG/ML SOAJ Inject 1 Syringe into the skin every 30 (thirty) days. (Patient taking differently: Inject 2 Syringes into the skin every 30 (thirty) days. ) 2 pen 0   Erenumab-aooe (AIMOVIG) 70 MG/ML SOAJ Inject 70 mg into the skin every 30 (thirty) days. 2 pen  0   Gabapentin, Once-Daily, (GRALISE) 300 MG TABS Take 900 mg by mouth every evening. (Patient taking differently: Take 600 mg by mouth every evening. ) 180 tablet 11   Multiple Vitamin (MULTIVITAMIN) tablet Take 1 tablet by mouth daily.     naproxen (NAPROSYN) 500 MG tablet Take 500 mg by mouth 2 (two) times daily with a meal.     Omega-3 Fatty Acids (FISH OIL) 1000 MG CAPS Take 1 capsule by mouth daily.     pravastatin (PRAVACHOL) 40 MG tablet TAKE 1 TABLET EVERY DAY 90 tablet 3   Psyllium (METAMUCIL PO) Take 2 tablets by mouth 2 (two) times daily.     Red Yeast Rice Extract (RED YEAST RICE PO) Take 1 tablet by mouth daily.     rizatriptan (MAXALT) 10 MG tablet  Take 1 tablet (10 mg total) by mouth as needed for migraine. May repeat in 2 hours if needed 30 tablet 4   SUMAtriptan (IMITREX) 100 MG tablet Take 1 tablet by mouth once as needed for migraine. May repeat in 2 hours if needed. Max 2 per 24 hours. No early refills. 9 tablet 11   tiZANidine (ZANAFLEX) 4 MG capsule Take 4 mg by mouth 3 (three) times daily as needed.      TURMERIC CURCUMIN PO Take 1,050 mg by mouth daily.     valACYclovir (VALTREX) 500 MG tablet Take 500 mg by mouth daily.     vitamin C (ASCORBIC ACID) 500 MG tablet Take 500 mg by mouth daily.     vitamin E 1000 UNIT capsule Take 1,000 Units by mouth daily.     Current Facility-Administered Medications on File Prior to Visit  Medication Dose Route Frequency Provider Last Rate Last Dose   0.9 %  sodium chloride infusion  500 mL Intravenous Once Ladene Artist, MD        PAST MEDICAL HISTORY: Past Medical History:  Diagnosis Date   ALLERGIC RHINITIS 04/22/2007   Allergy    Cancer ([place])    HX SKIN CANCER   Complication of anesthesia    more concerned about positioning of head & neck because of TMJ   DDD (degenerative disc disease), cervical    GERD (gastroesophageal reflux disease)    infrequently   Headache(784.0)    migraines   History of kidney stones    History of skin cancer    History of TMJ disorder    HYPERCHOLESTEROLEMIA 04/22/2007   HYPERLIPIDEMIA 07/29/2007   Hyperplastic colon polyp    NEPHROLITHIASIS, HX OF 07/29/2007   Osteoarth NOS-Unspec 04/22/2007   Shortness of breath    Shoulder pain, acute    bilateral    PAST SURGICAL HISTORY: Past Surgical History:  Procedure Laterality Date   APPENDECTOMY     BACK SURGERY  10/07/2015   COLONOSCOPY     10 yrs ago- 2 HPP    COLONOSCOPY  2019   JOINT REPLACEMENT     KNEE ARTHROSCOPY     right x2   SHOULDER SURGERY     right   skin cancer biopsy     L forehead   TOTAL KNEE ARTHROPLASTY Right 08/25/2013   Procedure:  RIGHT TOTAL KNEE ARTHROPLASTY;  Surgeon: Gearlean Alf, MD;  Location: [facility];  Service: Orthopedics;  Laterality: Right;   TOTAL SHOULDER ARTHROPLASTY Left 12/25/2013   DR SUPPLE    TOTAL SHOULDER ARTHROPLASTY Left 12/25/2013   Procedure: LEFT TOTAL SHOULDER ARTHROPLASTY;  Surgeon: Marin Shutter, MD;  Location: [facility];  Service: Orthopedics;  Laterality: Left;   TOTAL SHOULDER ARTHROPLASTY Right 06/18/2014   DR SUPPLE   TOTAL SHOULDER ARTHROPLASTY Right 06/18/2014   Procedure: RIGHT TOTAL SHOULDER ARTHROPLASTY;  Surgeon: Marin Shutter, MD;  Location: [facility];  Service: Orthopedics;  Laterality: Right;   WISDOM TOOTH EXTRACTION      SOCIAL HISTORY: Social History   Tobacco Use   Smoking status: Never Smoker   Smokeless tobacco: Never Used  Substance Use Topics   Alcohol use: No    Alcohol/week: 0.0 standard drinks   Drug use: No    FAMILY HISTORY: Family History  Problem Relation Age of Onset   COPD Father        family hx   Emphysema Father    Aneurysm Mother    Stroke Mother    Sudden death Mother    Cancer Mother    Obesity Mother    Breast cancer Mother    Cancer Maternal Grandfather        lung   Colon cancer Neg Hx    Colon polyps Neg Hx     ROS: Review of Systems  Constitutional: Negative for weight loss.  Gastrointestinal: Negative for nausea.  Endo/Heme/Allergies:       Negative for hypoglycemia    PHYSICAL EXAM: Blood pressure 113/77, pulse 95, temperature 98.2 F (36.8 C), temperature source Oral, height 5\' 9"  (1.753 m), weight 219 lb (99.3 kg), SpO2 93 %. Body mass index is 32.34 kg/m. Physical Exam  Constitutional: He is oriented to person, place, and time. He appears well-developed and well-nourished.  Cardiovascular: Normal rate.  Pulmonary/Chest: Effort normal.  Musculoskeletal: Normal range of motion.  Neurological: He is oriented to person, place, and time.  Skin: Skin is warm and dry.  Psychiatric: He has a normal mood  and affect. His behavior is normal.  Vitals reviewed.   RECENT LABS AND TESTS: BMET    Component Value Date/Time   NA 140 11/21/2017 0819   K 4.4 11/21/2017 0819   CL 103 11/21/2017 0819   CO2 21 11/21/2017 0819   GLUCOSE 93 11/21/2017 0819   GLUCOSE 94 07/31/2014 0920   GLUCOSE 94 08/20/2006 0830   BUN 25 11/21/2017 0819   CREATININE 1.14 11/21/2017 0819   CALCIUM 9.3 11/21/2017 0819   GFRNONAA 67 11/21/2017 0819   GFRAA 77 11/21/2017 0819   Lab Results  Component Value Date   HGBA1C 5.5 11/21/2017   HGBA1C 5.8 (H) 06/18/2017   Lab Results  Component Value Date   INSULIN 8.6 11/21/2017   INSULIN 16.5 06/18/2017   CBC    Component Value Date/Time   WBC 5.5 06/18/2017 1047   WBC 5.7 07/31/2014 0920   RBC 4.60 06/18/2017 1047   RBC 4.53 07/31/2014 0920   HGB 15.7 06/18/2017 1047   HCT 45.0 06/18/2017 1047   PLT 313.0 07/31/2014 0920   MCV 98 (H) 06/18/2017 1047   MCH 34.1 (H) 06/18/2017 1047   MCH 31.8 06/10/2014 0955   MCHC 34.9 06/18/2017 1047   MCHC 33.4 07/31/2014 0920   RDW 14.3 06/18/2017 1047   LYMPHSABS 1.8 06/18/2017 1047   MONOABS 0.5 07/31/2014 0920   EOSABS 0.3 06/18/2017 1047   BASOSABS 0.1 06/18/2017 1047   Iron/TIBC/Ferritin/ %Sat No results found for: IRON, TIBC, FERRITIN, IRONPCTSAT Lipid Panel     Component Value Date/Time   CHOL 239 (H) 11/21/2017 0819   TRIG 139 11/21/2017 0819   TRIG 52 08/20/2006 0830   HDL 62 11/21/2017 0819  CHOLHDL 4 07/31/2014 0920   VLDL 24.4 07/31/2014 0920   LDLCALC 149 (H) 11/21/2017 0819   LDLDIRECT 135.0 07/16/2013 0937   Hepatic Function Panel     Component Value Date/Time   PROT 7.0 11/21/2017 0819   ALBUMIN 4.3 11/21/2017 0819   AST 40 11/21/2017 0819   ALT 40 11/21/2017 0819   ALKPHOS 46 11/21/2017 0819   BILITOT 0.5 11/21/2017 0819   BILIDIR 0.1 07/31/2014 0920      Component Value Date/Time   TSH 2.920 06/18/2017 1047   TSH 1.53 07/31/2014 0920   TSH 1.85 07/16/2013 0937   Results  for Patient, Jesus S "DAVE" (MRN [id number]) as of 05/23/2018 10:01  Ref. Range 11/21/2017 08:19  Vitamin D, 25-Hydroxy Latest Ref Range: 30.0 - 100.0 ng/mL 59.6   ASSESSMENT AND PLAN: Prediabetes  Class 1 obesity with serious comorbidity and body mass index (BMI) of 32.0 to 32.9 in adult, unspecified obesity type  PLAN:  Pre-Diabetes Shiquan will continue to work on weight loss, exercise, and decreasing simple carbohydrates in his diet to help decrease the risk of diabetes. He was informed that eating too many simple carbohydrates or too many calories at one sitting increases the likelihood of GI side effects. We will recheck labs in 1 month and Armas agreed to follow up with [name] as directed to monitor his progress.  I spent > than 50% of the 25 minute visit on counseling as documented in the note.  Obesity Ahmar is currently in the action stage of change. As such, his goal is to get back to weightloss efforts  He has agreed to keep a food journal with 2500 calories and 150 grams of protein daily Marq has been instructed to work up to a goal of 150 minutes of combined cardio and strengthening exercise per week for weight loss and overall health benefits. We discussed the following Behavioral Modification Strategies today: keep a strict food journal, increasing lean protein intake, decreasing simple carbohydrates  and work on meal planning and easy cooking plans  Elizandro was reassured that we would help him lose weight again and he was counseled against all or nothing thinking.  Taiquan has agreed to follow up with our clinic in 3 weeks. He was informed of the importance of frequent follow up visits to maximize his success with intensive lifestyle modifications for his multiple health conditions.   OBESITY BEHAVIORAL INTERVENTION VISIT  Today's visit was # 12   Starting weight: 230 lbs Starting date: 06/18/17 Today's weight : 219 lbs  Today's date: 05/22/2018 Total lbs lost to date:  50   ASK: We discussed the diagnosis of obesity with [name] today and [name] agreed to give [name] permission to discuss obesity behavioral modification therapy today.  ASSESS: Rahkim has the diagnosis of obesity and his BMI today is 32.33 Maleak is in the action stage of change   ADVISE: [name] was educated on the multiple health risks of obesity as well as the benefit of weight loss to improve his health. He was advised of the need for long term treatment and the importance of lifestyle modifications to improve his current health and to decrease his risk of future health problems.  AGREE: Multiple dietary modification options and treatment options were discussed and  Errin agreed to follow the recommendations documented in the above note.  ARRANGE: Nollie was educated on the importance of frequent visits to treat obesity as outlined per CMS and USPSTF guidelines and agreed to schedule his next  follow up appointment today.  I, Doreene Nest, am acting as transcriptionist for Dennard Nip, MD I have reviewed the above documentation for accuracy and completeness, and I agree with the above. -Dennard Nip, MD

## 2018-05-29 ENCOUNTER — Telehealth: Payer: Self-pay | Admitting: Neurology

## 2018-05-29 MED ORDER — GABAPENTIN (ONCE-DAILY) 300 MG PO TABS: 300.0000 mg | ORAL_TABLET | Freq: Two times a day (BID) | ORAL | 11 refills | Status: DC

## 2018-05-29 NOTE — Telephone Encounter (Signed)
Pt requesting refills for Gabapentin, Once-Daily, (GRALISE) 300 MG TABS sent to [facility]

## 2018-05-29 NOTE — Telephone Encounter (Signed)
D/w Dr. Jaynee Eagles. Order placed for Gralise 300 mg BID per pt last report in email that he takes 1 tablet BID.

## 2018-05-30 MED ORDER — GABAPENTIN (ONCE-DAILY) 300 MG PO TABS: 300.0000 mg | ORAL_TABLET | Freq: Two times a day (BID) | ORAL | 11 refills | Status: DC

## 2018-05-30 NOTE — Telephone Encounter (Signed)
Received PA request from [facility]. [facility] has a program for potentially free or $20 gralise. Spoke with pt who preferred Gralise over gabapentin generic. Informed him of the program @ [facility]. He verbalized appreciation and will watch for their call.   Gralise refilled and sent to [facility]. Faxed response to [facility] to cancel the gralise at their location.

## 2018-05-30 NOTE — Addendum Note (Signed)
Addended by: Gildardo Griffes on: 05/30/2018 06:21 PM   Modules accepted: Orders

## 2018-06-04 MED ORDER — GABAPENTIN (ONCE-DAILY) 300 MG PO TABS: 300.0000 mg | ORAL_TABLET | Freq: Two times a day (BID) | ORAL | 0 refills | Status: DC

## 2018-06-04 NOTE — Telephone Encounter (Signed)
Pt's wife came and picked up the Gralise. Spoke with pt and he gave permission and provided her name Wilkes Potvin DOB [birth year] which was confirmed with her. She verbalized appreciation.

## 2018-06-04 NOTE — Addendum Note (Signed)
Addended by: Gildardo Griffes on: 06/04/2018 02:33 PM   Modules accepted: Orders

## 2018-06-04 NOTE — Telephone Encounter (Addendum)
Pharmacy is to be working on [place]. In the meantime Dr. Jaynee Eagles will provide some samples of Gralise 300 mg tablets to the patient. Called pt and informed him of this. He was very appreciative and will plan to come by the office tomorrow to pick up the medication.   Sample order placed and then printed for pt instructions.

## 2018-06-10 ENCOUNTER — Encounter: Payer: Medicare HMO | Admitting: Family Medicine

## 2018-06-12 ENCOUNTER — Encounter (INDEPENDENT_AMBULATORY_CARE_PROVIDER_SITE_OTHER): Payer: Self-pay | Admitting: Family Medicine

## 2018-06-12 ENCOUNTER — Encounter: Payer: Medicare HMO | Admitting: Family Medicine

## 2018-06-13 ENCOUNTER — Ambulatory Visit (INDEPENDENT_AMBULATORY_CARE_PROVIDER_SITE_OTHER): Payer: Medicare HMO | Admitting: Family Medicine

## 2018-06-13 VITALS — BP 104/71 | HR 92 | Temp 98.2°F | Ht 69.0 in | Wt 212.0 lb

## 2018-06-13 DIAGNOSIS — Z6831 Body mass index (BMI) 31.0-31.9, adult: Secondary | ICD-10-CM | POA: Diagnosis not present

## 2018-06-13 DIAGNOSIS — E669 Obesity, unspecified: Secondary | ICD-10-CM | POA: Diagnosis not present

## 2018-06-13 DIAGNOSIS — R7303 Prediabetes: Secondary | ICD-10-CM

## 2018-06-17 NOTE — Progress Notes (Signed)
Office: [phone]  /  Fax: [phone]   HPI:   Chief Complaint: OBESITY [name] [name] is here to discuss his progress with his obesity treatment plan. He is on the keep a food journal with 2500 calories and 150 grams of protein daily and is following his eating plan approximately 50-65 % of the time. He states he is walking 4,000-22,000 steps daily. [name] [name] has gotten back on track with journaling and exercise. He is doing well with decreasing sugar and increasing protein and vegetables. He is still struggling with his perfectionist behavior which borders on OCD but declines therapy or further evaluation.  His weight is 212 lb (96.2 kg) today and has had a weight loss of 7 pounds over a period of 3 weeks since his last visit. He has lost 18 lbs since starting treatment with [name].  Pre-Diabetes [name] [name] has a diagnosis of pre-diabetes based on his elevated Hgb A1c and was informed this puts him at greater risk of developing diabetes. He is back on track with diet and has decreased his sugar intake after a 3 month long ice cream binge. He is not taking metformin currently and continues to work on diet and exercise to decrease risk of diabetes. He denies nausea or hypoglycemia.  ALLERGIES: Allergies  Allergen Reactions  . Shrimp [Shellfish Allergy] Anaphylaxis and Rash  . Codeine Rash and Other (See Comments)    Blacked out  . Contrast Media [Iodinated Diagnostic Agents] Rash  . Diazepam Rash    Rash over whole body from neck down, and he lost his voice (airway compromise?)  . Amoxicillin Other (See Comments)    Unknown   . Benadryl [Diphenhydramine]     With dyes   . Depakote [Divalproex Sodium] Swelling    Per pts report (given by Dr. Inda Merlin).  Has throat swelling and rash.  . Erythromycin Other (See Comments)    unknown  . Flexeril [Cyclobenzaprine]   . Betadine [Povidone Iodine] Rash  . Celebrex [Celecoxib] Rash  . Meperidine And Related Rash  . Methocarbamol Rash  . Neosporin  [Neomycin-Bacitracin Zn-Polymyx] Rash  . Oxycodone Rash  . Penicillins Rash  . Plavix [Clopidogrel] Rash  . Rivaroxaban Rash  . Vioxx [Rofecoxib] Rash    MEDICATIONS: Current Outpatient Medications on File Prior to Visit  Medication Sig Dispense Refill  . allopurinol (ZYLOPRIM) 100 MG tablet TAKE 1 TABLET EVERY DAY 90 tablet 3  . amitriptyline (ELAVIL) 25 MG tablet Week one: 75mg  at bedtime Week two: 50mg  at bedtime Week three 25mg  at bedtime 90 tablet 6  . aspirin 81 MG tablet Take 81 mg by mouth daily.    . Cholecalciferol (VITAMIN D PO) Take 1 tablet by mouth daily.    . Coenzyme Q10 (CO Q 10 PO) Take by mouth daily.    . Cyanocobalamin (VITAMIN B 12 PO) Take by mouth.    . DULoxetine (CYMBALTA) 60 MG capsule TAKE 1 CAPSULE BY MOUTH DAILY 30 capsule 9  . EPINEPHRINE 0.3 mg/0.3 mL IJ SOAJ injection INJECT 0.3 MLS INTO THE MUSCLE ONCE 2 mL 1  . Erenumab-aooe (AIMOVIG) 70 MG/ML SOAJ Inject 1 Syringe into the skin every 30 (thirty) days. (Patient taking differently: Inject 2 Syringes into the skin every 30 (thirty) days. ) 2 pen 0  . Erenumab-aooe (AIMOVIG) 70 MG/ML SOAJ Inject 70 mg into the skin every 30 (thirty) days. 2 pen 0  . Gabapentin, Once-Daily, (GRALISE) 300 MG TABS Take 300 mg by mouth 2 (two) times daily. 60 tablet 11  .  Gabapentin, Once-Daily, (GRALISE) 300 MG TABS Take 300 mg by mouth 2 (two) times daily. 114 tablet 0  . Multiple Vitamin (MULTIVITAMIN) tablet Take 1 tablet by mouth daily.    . naproxen (NAPROSYN) 500 MG tablet Take 500 mg by mouth 2 (two) times daily with a meal.    . Omega-3 Fatty Acids (FISH OIL) 1000 MG CAPS Take 1 capsule by mouth daily.    . pravastatin (PRAVACHOL) 40 MG tablet TAKE 1 TABLET EVERY DAY 90 tablet 3  . Psyllium (METAMUCIL PO) Take 2 tablets by mouth 2 (two) times daily.    . Red Yeast Rice Extract (RED YEAST RICE PO) Take 1 tablet by mouth daily.    . rizatriptan (MAXALT) 10 MG tablet Take 1 tablet (10 mg total) by mouth as needed for  migraine. May repeat in 2 hours if needed 30 tablet 4  . SUMAtriptan (IMITREX) 100 MG tablet Take 1 tablet by mouth once as needed for migraine. May repeat in 2 hours if needed. Max 2 per 24 hours. No early refills. 9 tablet 11  . tiZANidine (ZANAFLEX) 4 MG capsule Take 4 mg by mouth 3 (three) times daily as needed.     . TURMERIC CURCUMIN PO Take 1,050 mg by mouth daily.    . valACYclovir (VALTREX) 500 MG tablet Take 500 mg by mouth daily.    . vitamin C (ASCORBIC ACID) 500 MG tablet Take 500 mg by mouth daily.    . vitamin E 1000 UNIT capsule Take 1,000 Units by mouth daily.     Current Facility-Administered Medications on File Prior to Visit  Medication Dose Route Frequency Provider Last Rate Last Dose  . 0.9 %  sodium chloride infusion  500 mL Intravenous Once Ladene Artist, MD        PAST MEDICAL HISTORY: Past Medical History:  Diagnosis Date  . ALLERGIC RHINITIS 04/22/2007  . Allergy   . Cancer (HCC)    HX SKIN CANCER  . Complication of anesthesia    more concerned about positioning of head & neck because of TMJ  . DDD (degenerative disc disease), cervical   . GERD (gastroesophageal reflux disease)    infrequently  . Headache(784.0)    migraines  . History of kidney stones   . History of skin cancer   . History of TMJ disorder   . HYPERCHOLESTEROLEMIA 04/22/2007  . HYPERLIPIDEMIA 07/29/2007  . Hyperplastic colon polyp   . NEPHROLITHIASIS, HX OF 07/29/2007  . Osteoarth NOS-Unspec 04/22/2007  . Shortness of breath   . Shoulder pain, acute    bilateral    PAST SURGICAL HISTORY: Past Surgical History:  Procedure Laterality Date  . APPENDECTOMY    . BACK SURGERY  10/07/2015  . COLONOSCOPY     10 yrs ago- 2 HPP   . COLONOSCOPY  2019  . JOINT REPLACEMENT    . KNEE ARTHROSCOPY     right x2  . SHOULDER SURGERY     right  . skin cancer biopsy     L forehead  . TOTAL KNEE ARTHROPLASTY Right 08/25/2013   Procedure: RIGHT TOTAL KNEE ARTHROPLASTY;  Surgeon: Gearlean Alf, MD;  Location: [facility];  Service: Orthopedics;  Laterality: Right;  . TOTAL SHOULDER ARTHROPLASTY Left 12/25/2013   DR SUPPLE   . TOTAL SHOULDER ARTHROPLASTY Left 12/25/2013   Procedure: LEFT TOTAL SHOULDER ARTHROPLASTY;  Surgeon: Marin Shutter, MD;  Location: [place];  Service: Orthopedics;  Laterality: Left;  . TOTAL SHOULDER ARTHROPLASTY Right  06/18/2014   DR SUPPLE  . TOTAL SHOULDER ARTHROPLASTY Right 06/18/2014   Procedure: RIGHT TOTAL SHOULDER ARTHROPLASTY;  Surgeon: Marin Shutter, MD;  Location: [place];  Service: Orthopedics;  Laterality: Right;  . WISDOM TOOTH EXTRACTION      SOCIAL HISTORY: Social History   Tobacco Use  . Smoking status: Never Smoker  . Smokeless tobacco: Never Used  Substance Use Topics  . Alcohol use: No    Alcohol/week: 0.0 standard drinks  . Drug use: No    FAMILY HISTORY: Family History  Problem Relation Age of Onset  . COPD Father        family hx  . Emphysema Father   . Aneurysm Mother   . Stroke Mother   . Sudden death Mother   . Cancer Mother   . Obesity Mother   . Breast cancer Mother   . Cancer Maternal Grandfather        lung  . Colon cancer Neg Hx   . Colon polyps Neg Hx     ROS: Review of Systems  Constitutional: Positive for weight loss.  Gastrointestinal: Negative for nausea.  Endo/Heme/Allergies:       Negative hypoglycemia    PHYSICAL EXAM: Blood pressure 104/71, pulse 92, temperature 98.2 F (36.8 C), temperature source Oral, height 5\' 9"  (1.753 m), weight 212 lb (96.2 kg), SpO2 95 %. Body mass index is 31.31 kg/m. Physical Exam  Constitutional: He is oriented to person, place, and time. He appears well-developed and well-nourished.  Cardiovascular: Normal rate.  Pulmonary/Chest: Effort normal.  Musculoskeletal: Normal range of motion.  Neurological: He is oriented to person, place, and time.  Skin: Skin is warm and dry.  Psychiatric: He has a normal mood and affect. His behavior is normal.  Vitals  reviewed.   RECENT LABS AND TESTS: BMET    Component Value Date/Time   NA 140 11/21/2017 0819   K 4.4 11/21/2017 0819   CL 103 11/21/2017 0819   CO2 21 11/21/2017 0819   GLUCOSE 93 11/21/2017 0819   GLUCOSE 94 07/31/2014 0920   GLUCOSE 94 08/20/2006 0830   BUN 25 11/21/2017 0819   CREATININE 1.14 11/21/2017 0819   CALCIUM 9.3 11/21/2017 0819   GFRNONAA 67 11/21/2017 0819   GFRAA 77 11/21/2017 0819   Lab Results  Component Value Date   HGBA1C 5.5 11/21/2017   HGBA1C 5.8 (H) 06/18/2017   Lab Results  Component Value Date   INSULIN 8.6 11/21/2017   INSULIN 16.5 06/18/2017   CBC    Component Value Date/Time   WBC 5.5 06/18/2017 1047   WBC 5.7 07/31/2014 0920   RBC 4.60 06/18/2017 1047   RBC 4.53 07/31/2014 0920   HGB 15.7 06/18/2017 1047   HCT 45.0 06/18/2017 1047   PLT 313.0 07/31/2014 0920   MCV 98 (H) 06/18/2017 1047   MCH 34.1 (H) 06/18/2017 1047   MCH 31.8 06/10/2014 0955   MCHC 34.9 06/18/2017 1047   MCHC 33.4 07/31/2014 0920   RDW 14.3 06/18/2017 1047   LYMPHSABS 1.8 06/18/2017 1047   MONOABS 0.5 07/31/2014 0920   EOSABS 0.3 06/18/2017 1047   BASOSABS 0.1 06/18/2017 1047   Iron/TIBC/Ferritin/ %Sat No results found for: IRON, TIBC, FERRITIN, IRONPCTSAT Lipid Panel     Component Value Date/Time   CHOL 239 (H) 11/21/2017 0819   TRIG 139 11/21/2017 0819   TRIG 52 08/20/2006 0830   HDL 62 11/21/2017 0819   CHOLHDL 4 07/31/2014 0920   VLDL 24.4 07/31/2014 0920  LDLCALC 149 (H) 11/21/2017 0819   LDLDIRECT 135.0 07/16/2013 0937   Hepatic Function Panel     Component Value Date/Time   PROT 7.0 11/21/2017 0819   ALBUMIN 4.3 11/21/2017 0819   AST 40 11/21/2017 0819   ALT 40 11/21/2017 0819   ALKPHOS 46 11/21/2017 0819   BILITOT 0.5 11/21/2017 0819   BILIDIR 0.1 07/31/2014 0920      Component Value Date/Time   TSH 2.920 06/18/2017 1047   TSH 1.53 07/31/2014 0920   TSH 1.85 07/16/2013 0937    ASSESSMENT AND PLAN: Prediabetes  Class 1  obesity with serious comorbidity and body mass index (BMI) of 31.0 to 31.9 in adult, unspecified obesity type  PLAN:  Pre-Diabetes [name] [name] will continue to work on weight loss, diet, exercise, and decreasing simple carbohydrates in his diet to help decrease the risk of diabetes. We dicussed metformin including benefits and risks. He was informed that eating too many simple carbohydrates or too many calories at one sitting increases the likelihood of GI side effects. [name] [name] declined metformin for now and a prescription was not written today. [name] [name] agrees to follow up with our clinic in 5 to 6 weeks as directed to monitor his progress and we will recheck labs at that time.  I spent > than 50% of the 25 minute visit on counseling as documented in the note.  Obesity [name] [name] is currently in the action stage of change. As such, his goal is to continue with weight loss efforts He has agreed to keep a food journal with 2500-2800 calories and 150+ grams of protein daily [name] [name] has been instructed to work up to a goal of 150 minutes of combined cardio and strengthening exercise per week for weight loss and overall health benefits. We discussed the following Behavioral Modification Strategies today: increasing lean protein intake, decreasing simple carbohydrates, and keep a strict food journal    [name] [name] has agreed to follow up with our clinic in 5 to 6 weeks. He was informed of the importance of frequent follow up visits to maximize his success with intensive lifestyle modifications for his multiple health conditions.   OBESITY BEHAVIORAL INTERVENTION VISIT  Today's visit was # 13   Starting weight: 230 lbs Starting date: 06/18/17 Today's weight : 212 lbs  Today's date: 06/13/2018 Total lbs lost to date: 63    ASK: We discussed the diagnosis of obesity with [name] [name] today and [name] [name] agreed to give [name] permission to discuss obesity behavioral modification therapy today.  ASSESS: [name] [name] has the  diagnosis of obesity and his BMI today is 31.29 [name] [name] is in the action stage of change   ADVISE: [name] [name] was educated on the multiple health risks of obesity as well as the benefit of weight loss to improve his health. He was advised of the need for long term treatment and the importance of lifestyle modifications to improve his current health and to decrease his risk of future health problems.  AGREE: Multiple dietary modification options and treatment options were discussed and  [name] [name] agreed to follow the recommendations documented in the above note.  ARRANGE: Alessander was educated on the importance of frequent visits to treat obesity as outlined per CMS and USPSTF guidelines and agreed to schedule his next follow up appointment today.  I, Trixie Dredge, am acting as transcriptionist for Dennard Nip, MD  I have reviewed the above documentation for accuracy and completeness, and I agree with the above. -Dennard Nip, MD

## 2018-06-20 ENCOUNTER — Encounter: Payer: Self-pay | Admitting: Family Medicine

## 2018-06-20 ENCOUNTER — Ambulatory Visit (INDEPENDENT_AMBULATORY_CARE_PROVIDER_SITE_OTHER): Payer: Medicare HMO | Admitting: Family Medicine

## 2018-06-20 VITALS — BP 100/70 | HR 66 | Temp 98.4°F | Wt 217.0 lb

## 2018-06-20 DIAGNOSIS — E6609 Other obesity due to excess calories: Secondary | ICD-10-CM | POA: Diagnosis not present

## 2018-06-20 DIAGNOSIS — G43109 Migraine with aura, not intractable, without status migrainosus: Secondary | ICD-10-CM

## 2018-06-20 DIAGNOSIS — Z6830 Body mass index (BMI) 30.0-30.9, adult: Secondary | ICD-10-CM | POA: Diagnosis not present

## 2018-06-20 DIAGNOSIS — M159 Polyosteoarthritis, unspecified: Secondary | ICD-10-CM | POA: Diagnosis not present

## 2018-06-20 DIAGNOSIS — Z23 Encounter for immunization: Secondary | ICD-10-CM

## 2018-06-20 NOTE — Progress Notes (Signed)
Subjective:    Patient ID: [name] [name], male    DOB: 1951-06-17, 67 y.o.   MRN: [id number]  No chief complaint on file.   HPI Patient was seen today for f/u on chronic issues and TOC, previously seen by Dr. Inda Merlin.  Pt walks upwards of 10 miles/day.  He keeps extensive records/spread sheets, which he brings to clinic.  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  Pt has a h/o OA which at times is aggrevated when he pushes himself.  Pt has a R TKR, b/l shoulder replacement-[name] in L>R, spinal fusion.  pt gained a significant amt of wt d/t his surgeries.  Pt is followed by Dr. Trixie Rude for weight management.     Pt followed by Dr. Jaynee Eagles for Migraines.  Pt on amiovig (samples) since August.  Notes improvement in HA frequency.  Has chart detailing # of HAs.  Pt also taking gabapentin 300 mg BID and amitriptyline 100 mg daily.  Pt notes gabapentin capsules are cheaper than the tabs.  Past Medical History:  Diagnosis Date  . ALLERGIC RHINITIS 04/22/2007  . Allergy   . Cancer (HCC)    HX SKIN CANCER  . Complication of anesthesia    more concerned about positioning of head & neck because of TMJ  . DDD (degenerative disc disease), cervical   . GERD (gastroesophageal reflux disease)    infrequently  . Headache(784.0)    migraines  . History of kidney stones   . History of skin cancer   . History of TMJ disorder   . HYPERCHOLESTEROLEMIA 04/22/2007  . HYPERLIPIDEMIA 07/29/2007  . Hyperplastic colon polyp   . NEPHROLITHIASIS, HX OF 07/29/2007  . Osteoarth NOS-Unspec 04/22/2007  . Shortness of breath   . Shoulder pain, acute    bilateral    Allergies  Allergen Reactions  . Shrimp [Shellfish Allergy] Anaphylaxis and Rash  . Codeine Rash and Other (See Comments)    Blacked out  . Contrast Media [Iodinated Diagnostic Agents] Rash  . Diazepam Rash    Rash over whole body from neck down, and he lost his voice (airway compromise?)  .  Amoxicillin Other (See Comments)    Unknown   . Benadryl [Diphenhydramine]     With dyes   . Depakote [Divalproex Sodium] Swelling    Per pts report (given by Dr. Inda Merlin).  Has throat swelling and rash.  . Erythromycin Other (See Comments)    unknown  . Flexeril [Cyclobenzaprine]   . Betadine [Povidone Iodine] Rash  . Celebrex [Celecoxib] Rash  . Meperidine And Related Rash  . Methocarbamol Rash  . Neosporin [Neomycin-Bacitracin Zn-Polymyx] Rash  . Oxycodone Rash  . Penicillins Rash  . Plavix [Clopidogrel] Rash  . Rivaroxaban Rash  . Vioxx [Rofecoxib] Rash    ROS General: Denies fever, chills, night sweats, changes in appetite  +wt gain s/p surgeries HEENT: Denies ear pain, changes in vision, rhinorrhea, sore throat  +h/o frequent migraines CV: Denies CP, palpitations, SOB, orthopnea Pulm: Denies SOB, cough, wheezing GI: Denies abdominal pain, nausea, vomiting, diarrhea, constipation GU: Denies dysuria, hematuria, frequency, vaginal discharge Msk: Denies muscle cramps, joint pains +joint pain 2/2 OA. Neuro: Denies weakness, numbness, tingling Skin: Denies rashes, bruising Psych: Denies depression, anxiety, hallucinations     Objective:    Blood pressure 100/70, pulse 66, temperature 98.4 F (36.9 C), temperature source Oral, weight 217 lb (98.4 kg), SpO2 97 %.   Gen. Pleasant, well-nourished, in no  distress, normal affect   HEENT: [place]/AT, face symmetric, no scleral icterus, PERRLA, nares patent without drainage Lungs: no accessory muscle use, CTAB, no wheezes or rales Cardiovascular: RRR, no m/r/g, no peripheral edema Neuro:  A&Ox3, CN II-XII intact, normal gait  Wt Readings from Last 3 Encounters:  06/20/18 217 lb (98.4 kg)  06/13/18 212 lb (96.2 kg)  05/22/18 219 lb (99.3 kg)    Lab Results  Component Value Date   WBC 5.5 06/18/2017   HGB 15.7 06/18/2017   HCT 45.0 06/18/2017   PLT 313.0 07/31/2014   GLUCOSE 93 11/21/2017   CHOL 239 (H) 11/21/2017    TRIG 139 11/21/2017   HDL 62 11/21/2017   LDLDIRECT 135.0 07/16/2013   LDLCALC 149 (H) 11/21/2017   ALT 40 11/21/2017   AST 40 11/21/2017   NA 140 11/21/2017   K 4.4 11/21/2017   CL 103 11/21/2017   CREATININE 1.14 11/21/2017   BUN 25 11/21/2017   CO2 21 11/21/2017   TSH 2.920 06/18/2017   PSA 3.54 07/31/2014   INR 0.98 06/10/2014   HGBA1C 5.5 11/21/2017    Assessment/Plan:  Migraine with aura and without status migrainosus, not intractable  -continue Aimovig, gabapentin, amitriptyline -continue f/u with Neurology - Plan: gabapentin (NEURONTIN) 300 MG capsule  Osteoarthritis of multiple joints, unspecified osteoarthritis type -continue walking, though advised to show som restraint to avoid injury. -continue f/u with Ortho  Class 1 obesity due to excess calories with serious comorbidity and body mass index (BMI) of 30.0 to 30.9 in adult -discussed the importance of restraint.  Pt advised not to push himself when working out.  If starts to have joint pain etc, pt advised to stop exercising for the day. -continue f/u with [name]  Need for influenza vaccination  - Plan: Flu vaccine HIGH DOSE PF   F/u in Nov for CPE.  More than 50% of over 20 minutes spent in total in caring for this patient was spent face-to-face with the patient, counseling and/or coordinating care.   Grier Mitts, MD

## 2018-06-23 ENCOUNTER — Encounter: Payer: Self-pay | Admitting: Family Medicine

## 2018-06-23 MED ORDER — GABAPENTIN 300 MG PO CAPS: 300.0000 mg | ORAL_CAPSULE | Freq: Two times a day (BID) | ORAL | 3 refills | Status: DC

## 2018-06-24 ENCOUNTER — Encounter: Payer: Self-pay | Admitting: Neurology

## 2018-06-24 ENCOUNTER — Ambulatory Visit: Payer: Medicare HMO | Admitting: Neurology

## 2018-06-24 VITALS — BP 124/78 | HR 65 | Ht 69.0 in | Wt 222.0 lb

## 2018-06-24 DIAGNOSIS — H9313 Tinnitus, bilateral: Secondary | ICD-10-CM

## 2018-06-24 DIAGNOSIS — G43711 Chronic migraine without aura, intractable, with status migrainosus: Secondary | ICD-10-CM

## 2018-06-24 DIAGNOSIS — G43109 Migraine with aura, not intractable, without status migrainosus: Secondary | ICD-10-CM | POA: Diagnosis not present

## 2018-06-24 MED ORDER — GABAPENTIN 300 MG PO CAPS: 300.0000 mg | ORAL_CAPSULE | Freq: Two times a day (BID) | ORAL | 4 refills | Status: DC

## 2018-06-24 MED ORDER — DIVALPROEX SODIUM 250 MG PO DR TAB: 250.0000 mg | DELAYED_RELEASE_TABLET | Freq: Two times a day (BID) | ORAL | 6 refills | Status: DC

## 2018-06-24 MED ORDER — TIZANIDINE HCL 4 MG PO CAPS: 4.0000 mg | ORAL_CAPSULE | Freq: Three times a day (TID) | ORAL | 4 refills | Status: DC | PRN

## 2018-06-24 MED ORDER — SUMATRIPTAN SUCCINATE 100 MG PO TABS: ORAL_TABLET | ORAL | 4 refills | Status: DC

## 2018-06-24 MED ORDER — DULOXETINE HCL 60 MG PO CPEP: 60.0000 mg | ORAL_CAPSULE | Freq: Every day | ORAL | 4 refills | Status: DC

## 2018-06-24 MED ORDER — AMITRIPTYLINE HCL 100 MG PO TABS: 100.0000 mg | ORAL_TABLET | Freq: Every day | ORAL | 4 refills | Status: DC

## 2018-06-24 NOTE — Progress Notes (Signed)
[name]    Provider:  Dr [name] Referring Provider: Marletta Lor, MD Primary Care Physician:  [name] Ruddy, MD  CC:  Chronic Migraines  Interval history 06/24/2018: he tried Topiramate. He had a terrible July and associated with his back pain. He had a great August because his back felt better. Only 2 minor migraines in August. We tried Topiramate and could not tolerate, tried coming off Amitriptyline and had worse headaches. He takes capsules Gabapentin generic twice daily.  He sees no difference between aimovig 140mg  qmonthly vs 70mg .   Interval history 03/20/2018: Here for follow up of migraines on Aimovig, Gralise, elavil, cymbalta, triptans, tizanidine. He was doing exceptionally well but he had a colonoscopy that worsened his back pain  And also worsened his migraines. He declines botox for migraine, have discussed with him a few times. He does not want botox. He is having difficulty with weight loss despite physical activity and a good diet. Advised TCAs can cause this as can gabapentin. Will try to decrease his amitriptyline. Then start Topiramate ER and decrease neurontin.  Reviewed headache diary: Total headache days a month:  January 2 February 16 March 17 Apil 5 May 5 June 9  Tried: Amitriptyline, Topamax, Cymbalta, Rizatriptan, Aimovig, Melatonin, Coffe before bed (possibly hypnic headaches), Depakote (swelling allergy), Gralise, Tizanidine, Naproxen, flexeril, methocarbamol,   Interval history 11/20/2017: Since starting Gralise and aimovig he is doing extremely well. In January had 2 migraines, in February had 4 days of migraines but otherwise none. The migraines are not as long and not as intense. Reduced intensity. We were having a lot of difficulty getting Aimovig approved, he spent hours on the phone. Gave him samples today. He is doing spectacularly on Aimovig. He takes Tizanidine and Amitriptyline at bedtime. He takes Gralise with food. He  continues to [facility], he lost 20 pounds. He has been walking, walking everyday. Continue one injection.   Interval history 07/23/2017:  [name] [name] is a 67 y.o. male here as a referral from Dr. Burnice [name] for Migraines. They are improved on Amitriptyline, Gralise and Aimovig. The intensity and duration are improved as well as the frequency. Also helping him sleep. However making him more tired during the early part of the day. He has a "lot of pain" in other places around his body, pain in his right thumb and index finger, he has had multiple orthopaedic operations. They moved into a new house. He has back pain, knee pain and thumb pain. He carried a lot of boxes during the move which exacerbated his symptoms. He feels like his weight is not improving, discussed these medications have weight gain.  He saw Dr. Leafy [name] a month ago, Reviewed his migraine diary in detail, talked in length about his migraines which have decreased in frequency by 48% and 36.7% in frequency however his migraine severity has reduced by 50% as did it.   Interval history 04/26/2017: patient is following for nocturnal and morning headaches. Has been diagnosed with sleep apnea but refuses CPAP machine and despite discussions that this could be the cause of his headaches. These tried multiple medications as below, we've tried treatments for migraines as well as possibly hypnic headaches. We've tried the new CGRP monoclonal antibody Aimovig. He has had a migraine every day for the last 2 weeks. Happening at night about 1/2 the time and now more during the day associated with back pain. He is taking melatonin, amitriptyline, cymbalta and flexeril  at night. Recommend seeing Dr. Rexene [name] to discuss sleep study in detail which showed sleep apnea.   Interval history 01/04/2017:Patient following up for morning headaches. MRI brain was normal. Patient has OSA diagnosed here at [facility] but he declined to be treated  despite headaches waking him up at night. I have highly encouraged him to use a cpap as this may resolve his nocturnal and morning headaches and also warned of the sequelae of untreated sleep apnea such as increased risk of stroke. He takes tylenol PM every night, cymbalta, He is on Amitriptyline 75mg  at night. He has been to PT for his musculoskeletal neck pain.   Tried: Amitriptyline, Topamax, Cymbalta, Rizatriptan, Aimovig, Melatonin, Coffe before bed (possibly hypnic headaches), Depakote (swelling allergy), Gralise, Tizanidine  Has not tried: Depakote, propranolol, Indomethacin,   INO:[name] [name] a 67 y.o.malehere as a referral from Dr. Raynelle [name] migraines. PMHx migraines. He has had migraines/headachesfor 10 years. He travels a lot and has significant osteoarthritis. He has had shoulder replacements and back fusion of l4-l5. His skeletal issues are a migraine trigger. All his migraines are in the middle of the night, he wakes up with headaches 95% of the time. He has seen a lot of doctors. Sumatriptan helps with the migraines. He has been to many doctors. He saw [name] for headaches. He has daily headaches. He was taking Sumatriptan daily but now only 9 times a month and no medication overuse. Headaches start in the neck, feels like he is going to explode, all over the head, so severe he can't even look at things, painful pressure all over the head, in his eyes behind his eyes, stiffness and tense, his head is pounding, nausea, extreme light and sound sensitivity. He snores, he doesn't wake up from snoring. No aura. He will also get migraines during the day if he is not conscious of his posture. Migraines last 8 hours. He is terrified to go to bed at night because he wakes up with headaches. No vision changes, speech changes, new weakness or sensory changes. No other focal neurologic deficits, associated symptoms, inciting events or modifiable factors.  Tried: Topiramate,  Elavil, acetaminophen, imitrex   Reviewed notes, labs and imaging from outside physicians, which showed:  Reviewed primary care notes and notes from [facility]. Patient has a history of migraines. Migraines have been refractory. These responded nicely to sumatriptan but his insurance company has switched to an alternate triptan and he is quite limited in his monthly supply. He is under considerable stress recently. He tried Topamax which she did not tolerate well and recently discontinued. He is previous s/p posterior lumbar interbody fusion at L4-L5 performed on 10-07-15 with noted degenerative changes at L3-L4 with collapse to the right and noted degenerative scoliosis. He has right low back pain and right hip pain that is constant and it hurts in any position. He denies any radicular pain or numbness or weakness. He has a progressive collapse of the disc space at L3-4 that is eccentric and now accentuating the lateral listhesis at L2-3. The MR shows obvious endplate inflammation as well so they will start with ESI efforts and see if this helps.If not, he will need an extension of his fusion with correciton of the deformity up to L2.Marland Kitchen  MRI LUMBAR SPINE WITHOUT CONTRAST, 08/16/2016 10:51 AM   INDICATION: LOW BACK PAIN, >6 WKS / RED FLAG(S) / RADICULOPATHY \ \ Z98.1 S/P lumbar fusion \ M54.5 Spine pain, lumbar   COMPARISON: Lumbar spine radiograph  07/18/2016 and CT myelogram 07/12/2015   TECHNIQUE: Multiplanar, multi-sequence surface-coil MR imaging of the lumbar spine was performed without contrast.   LEVELS IMAGED: Lower thoracic to upper sacral region.   FINDINGS:  Alignment: Similar grade 1 anterolisthesis of L4 and L5. Trace retrolisthesis of L2 on L3, similar to prior.Similar levoscoliotic curvature of the spine. Vertebrae: There are fatty endplate degenerative changes at L4-L5. Increased T2/STIR signal within the endplates at I0-X7 and the inferior endplate of L2,  compatible with edema/inflammation, likely degenerative in etiology. Redemonstrated postsurgical changes of posterior lumbar interbody fusion at L4-L5 with pedicle screw and rod internal fixation. Paraspinal soft tissues: Increased T2/STIR signal within the paraspinal musculature adjacent to the operative site, which is favored to reflect postsurgical changes. Conus: Normal in signal and position, terminating at the superior aspect of L2  T12-L1: No significant focal abnormality. L1-L2: No significant focal abnormality. L2-L3: Similar trace retrolisthesis of L2 on L3 with slight uncovering of the disc. There is a left eccentric disc bulge with disc desiccation and height loss. Facet hypertrophy with ligamentum flavum thickening and facet effusions bilaterally. There is mild bilateral foraminal narrowing L3-L4: There is marked narrowing of the right aspect of the disc space at L3-4 with surrounding high STIR, low T1 signal compatible with edema, likely related to progressive degenerative changes at this level. Broad-based disc bulge with facet hypertrophy and ligamentum flavum thickening. There is moderate right lateral recess narrowing. There is moderate right foraminal narrowing and mild left foraminal narrowing. L4-L5: Grade one anterolisthesis of L4 and L5, with slight uncovering of the disc. There is facet hypertrophy and ligamentum flavum thickening without canal narrowing. There is soft tissue extending into the foramen on the right at L4-L5.  L5-S1: Left eccentric disc bulge. There is moderate left foraminal narrowing. No significant canal narrowing.    Review of Systems: Patient complains of symptoms per HPI as well as the following symptoms:No CP, no SOB. Pertinent negatives per HPI. All others negative.    Social History   Socioeconomic History  . Marital status: Married    Spouse name: Not on file  . Number of children: 1  . Years of education: 16+  . Highest education  level: Bachelor's degree (e.g., BA, AB, BS)  Occupational History  . Occupation: Retired  Scientific laboratory technician  . Financial resource strain: Not on file  . Food insecurity:    Worry: Not on file    Inability: Not on file  . Transportation needs:    Medical: Not on file    Non-medical: Not on file  Tobacco Use  . Smoking status: Never Smoker  . Smokeless tobacco: Never Used  Substance and Sexual Activity  . Alcohol use: No    Alcohol/week: 0.0 standard drinks  . Drug use: No  . Sexual activity: Yes    Partners: Female    Birth control/protection: None  Lifestyle  . Physical activity:    Days per week: Not on file    Minutes per session: Not on file  . Stress: Not on file  Relationships  . Social connections:    Talks on phone: Not on file    Gets together: Not on file    Attends religious service: Not on file    Active member of club or organization: Not on file    Attends meetings of clubs or organizations: Not on file    Relationship status: Not on file  . Intimate partner violence:    Fear of current or ex partner:  Not on file    Emotionally abused: Not on file    Physically abused: Not on file    Forced sexual activity: Not on file  Other Topics Concern  . Not on file  Social History Narrative   Lives at home w/ his wife   Right-handed   Caffeine: 2 cups of coffee each morning    Family History  Problem Relation Age of Onset  . COPD Father        family hx  . Emphysema Father   . Aneurysm Mother   . Stroke Mother   . Sudden death Mother   . Cancer Mother   . Obesity Mother   . Breast cancer Mother   . Cancer Maternal Grandfather        lung  . Colon cancer Neg Hx   . Colon polyps Neg Hx     Past Medical History:  Diagnosis Date  . ALLERGIC RHINITIS 04/22/2007  . Allergy   . Cancer (HCC)    HX SKIN CANCER  . Complication of anesthesia    more concerned about positioning of head & neck because of TMJ  . DDD (degenerative disc disease), cervical   .  GERD (gastroesophageal reflux disease)    infrequently  . Headache(784.0)    migraines  . History of kidney stones   . History of skin cancer   . History of TMJ disorder   . HYPERCHOLESTEROLEMIA 04/22/2007  . HYPERLIPIDEMIA 07/29/2007  . Hyperplastic colon polyp   . NEPHROLITHIASIS, HX OF 07/29/2007  . Osteoarth NOS-Unspec 04/22/2007  . Shortness of breath   . Shoulder pain, acute    bilateral    Past Surgical History:  Procedure Laterality Date  . APPENDECTOMY    . BACK SURGERY  10/07/2015  . COLONOSCOPY     10 yrs ago- 2 HPP   . COLONOSCOPY  2019  . JOINT REPLACEMENT    . KNEE ARTHROSCOPY     right x2  . SHOULDER SURGERY     right  . skin cancer biopsy     L forehead  . TOTAL KNEE ARTHROPLASTY Right 08/25/2013   Procedure: RIGHT TOTAL KNEE ARTHROPLASTY;  Surgeon: Gearlean Alf, MD;  Location: [facility];  Service: Orthopedics;  Laterality: Right;  . TOTAL SHOULDER ARTHROPLASTY Left 12/25/2013   DR SUPPLE   . TOTAL SHOULDER ARTHROPLASTY Left 12/25/2013   Procedure: LEFT TOTAL SHOULDER ARTHROPLASTY;  Surgeon: Marin Shutter, MD;  Location: [place];  Service: Orthopedics;  Laterality: Left;  . TOTAL SHOULDER ARTHROPLASTY Right 06/18/2014   DR SUPPLE  . TOTAL SHOULDER ARTHROPLASTY Right 06/18/2014   Procedure: RIGHT TOTAL SHOULDER ARTHROPLASTY;  Surgeon: Marin Shutter, MD;  Location: [place];  Service: Orthopedics;  Laterality: Right;  . WISDOM TOOTH EXTRACTION      Current Outpatient Medications  Medication Sig Dispense Refill  . allopurinol (ZYLOPRIM) 100 MG tablet TAKE 1 TABLET EVERY DAY 90 tablet 3  . amitriptyline (ELAVIL) 100 MG tablet Take 1 tablet (100 mg total) by mouth at bedtime. 90 tablet 4  . aspirin 81 MG tablet Take 81 mg by mouth daily.    . Cholecalciferol (VITAMIN D PO) Take 1 tablet by mouth daily.    . Coenzyme Q10 (CO Q 10 PO) Take by mouth daily.    . Cyanocobalamin (VITAMIN B 12 PO) Take by mouth.    . DULoxetine (CYMBALTA) 60 MG capsule Take 1 capsule (60  mg total) by mouth daily. 90 capsule 4  .  EPINEPHRINE 0.3 mg/0.3 mL IJ SOAJ injection INJECT 0.3 MLS INTO THE MUSCLE ONCE 2 mL 1  . Erenumab-aooe (AIMOVIG) 70 MG/ML SOAJ Inject 70 mg into the skin every 30 (thirty) days. 2 pen 0  . gabapentin (NEURONTIN) 300 MG capsule Take 1 capsule (300 mg total) by mouth 2 (two) times daily. 180 capsule 4  . Gabapentin, Once-Daily, (GRALISE) 300 MG TABS Take 300 mg by mouth 2 (two) times daily. 60 tablet 11  . Multiple Vitamin (MULTIVITAMIN) tablet Take 1 tablet by mouth daily.    . naproxen (NAPROSYN) 500 MG tablet Take 500 mg by mouth 2 (two) times daily with a meal.    . Omega-3 Fatty Acids (FISH OIL) 1000 MG CAPS Take 1 capsule by mouth daily.    . pravastatin (PRAVACHOL) 40 MG tablet TAKE 1 TABLET EVERY DAY 90 tablet 3  . Psyllium (METAMUCIL PO) Take 2 tablets by mouth 2 (two) times daily.    . Red Yeast Rice Extract (RED YEAST RICE PO) Take 1 tablet by mouth daily.    . rizatriptan (MAXALT) 10 MG tablet Take 1 tablet (10 mg total) by mouth as needed for migraine. May repeat in 2 hours if needed 30 tablet 4  . SUMAtriptan (IMITREX) 100 MG tablet Take 1 tablet by mouth once as needed for migraine. May repeat in 2 hours if needed. Max 2 per 24 hours. 27 tablet 4  . tiZANidine (ZANAFLEX) 4 MG capsule Take 1 capsule (4 mg total) by mouth 3 (three) times daily as needed. 270 capsule 4  . TURMERIC CURCUMIN PO Take 1,050 mg by mouth daily.    . valACYclovir (VALTREX) 500 MG tablet Take 500 mg by mouth daily.    . vitamin C (ASCORBIC ACID) 500 MG tablet Take 500 mg by mouth daily.    . vitamin E 1000 UNIT capsule Take 1,000 Units by mouth daily.    . divalproex (DEPAKOTE) 250 MG DR tablet Take 1 tablet (250 mg total) by mouth 2 (two) times daily. 180 tablet 6   Current Facility-Administered Medications  Medication Dose Route Frequency Provider Last Rate Last Dose  . 0.9 %  sodium chloride infusion  500 mL Intravenous Once Ladene Artist, MD         Allergies as of 06/24/2018 - Review Complete 06/24/2018  Allergen Reaction Noted  . Shrimp [shellfish allergy] Anaphylaxis and Rash 05/22/2012  . Codeine Rash and Other (See Comments)   . Contrast media [iodinated diagnostic agents] Rash 11/11/2015  . Diazepam Rash 06/26/2014  . Amoxicillin Other (See Comments)   . Benadryl [diphenhydramine]  10/23/2017  . Depakote [divalproex sodium] Swelling 07/30/2017  . Erythromycin Other (See Comments)   . Flexeril [cyclobenzaprine]  10/23/2017  . Betadine [povidone iodine] Rash 05/22/2012  . Celebrex [celecoxib] Rash 05/22/2012  . Meperidine and related Rash 05/22/2012  . Methocarbamol Rash 05/22/2012  . Neosporin [neomycin-bacitracin zn-polymyx] Rash 10/23/2017  . Oxycodone Rash 10/14/2015  . Penicillins Rash   . Plavix [clopidogrel] Rash 12/11/2013  . Rivaroxaban Rash 10/20/2013  . Vioxx [rofecoxib] Rash 05/22/2012    Vitals: BP 124/78 (BP Location: Right Arm, Patient Position: Sitting)   Pulse 65   Ht 5\' 9"  (1.753 m)   Wt 222 lb (100.7 kg)   BMI 32.78 kg/m  Last Weight:  Wt Readings from Last 1 Encounters:  06/24/18 222 lb (100.7 kg)   Last Height:   Ht Readings from Last 1 Encounters:  06/24/18 5\' 9"  (1.753 m)   Physical exam: Exam:  Gen: NAD, conversant, well nourised, obese, well groomed                     CV: RRR, no MRG. No Carotid Bruits. No peripheral edema, warm, nontender Eyes: Conjunctivae clear without exudates or hemorrhage  Neuro: Detailed Neurologic Exam  Speech:    Speech is normal; fluent and spontaneous with normal comprehension.  Cognition:    The patient is oriented to person, place, and time;     recent and remote memory intact;     language fluent;     normal attention, concentration,     fund of knowledge Cranial Nerves:    The pupils are equal, round, and reactive to light. The fundi are normal and spontaneous venous pulsations are present. Visual fields are full to finger confrontation.  Extraocular movements are intact. Trigeminal sensation is intact and the muscles of mastication are normal. The face is symmetric. The palate elevates in the midline. Hearing intact. Voice is normal. Shoulder shrug is normal. The tongue has normal motion without fasciculations.   Coordination:    Normal finger to nose and heel to shin. Normal rapid alternating movements.   Gait:    Heel-toe and tandem gait are normal.   Motor Observation:    No asymmetry, no atrophy, and no involuntary movements noted. Tone:    Normal muscle tone.    Posture:    Posture is normal. normal erect    Strength:    Strength is V/V in the upper and lower limbs.      Sensation: intact to LT     Reflex Exam:  DTR's:    Deep tendon reflexes in the upper and lower extremities are normal bilaterally.   Toes:    The toes are downgoing bilaterally.   Clonus:    Clonus is absent.        Assessment/Plan:67 year old with chronic migraines. Significantly improved on Aimovig but worsened the last few months in the setting of back pain and stress.  - Continue Aimovig, had long discussion with patient, 70mg  (no diff with 140) - [facility] He is struggling. His TCA and gabapentin may have something to do with his struggles, we tried to transition to [place] but did not tolerate continue capsules gabapentin IR, Aimovig 70, Amitriptyline 100, Duloxetine 60.   - Try Depakote for migraine prevention  Previous A/P:  Currently on 4 migraine preventatives: Cymbalta, Amitriptyline, Gralise and Aimovig. Has failed Topiramate (Tried: Amitriptyline, Topamax, Cymbalta, Rizatriptan, Aimovig, Melatonin, Coffe before bed (possibly hypnic headaches), : Tried: Amitriptyline, Topamax, Cymbalta, Rizatriptan, Aimovig, Melatonin, Coffe before bed (possibly hypnic headaches), Depakote (swelling allergy), Gralise, Tizanidine  Declines botox for migraines  Acute medication for migraines:  Continue Sumatriptan/rizatriptan. Discussed medication overuse and not to take acute medications more than 10 days a month for migraines.   MRI brain wo contrast: normal  He has been to [facility] and did not have a good experience, declines further PT  New problem: Tinnitus. May have coincided with taking Naproxen, asked him to stop Naproxen.   [facility] for [facility]  Orders Placed This Encounter  Procedures  . Ambulatory referral to ENT  . Ambulatory referral to Audiology     Meds ordered this encounter  Medications  . SUMAtriptan (IMITREX) 100 MG tablet    Sig: Take 1 tablet by mouth once as needed for migraine. May repeat in 2 hours if needed. Max 2 per 24 hours.  Dispense:  27 tablet    Refill:  4  . gabapentin (NEURONTIN) 300 MG capsule    Sig: Take 1 capsule (300 mg total) by mouth 2 (two) times daily.    Dispense:  180 capsule    Refill:  4  . tiZANidine (ZANAFLEX) 4 MG capsule    Sig: Take 1 capsule (4 mg total) by mouth 3 (three) times daily as needed.    Dispense:  270 capsule    Refill:  4  . amitriptyline (ELAVIL) 100 MG tablet    Sig: Take 1 tablet (100 mg total) by mouth at bedtime.    Dispense:  90 tablet    Refill:  4  . divalproex (DEPAKOTE) 250 MG DR tablet    Sig: Take 1 tablet (250 mg total) by mouth 2 (two) times daily.    Dispense:  180 tablet    Refill:  6  . DULoxetine (CYMBALTA) 60 MG capsule    Sig: Take 1 capsule (60 mg total) by mouth daily.    Dispense:  90 capsule    Refill:  4    Please consider 90 day supplies to promote better adherence     Sarina Ill, MD  [facility] [address] [place], [place] [address]  Phone [phone] Fax [phone]  A total of 40 minutes was spent face-to-face with this patient. Over half this time was spent on counseling patient on the  1. Chronic migraine without aura, with intractable migraine, so stated, with status migrainosus   2.  Migraine with aura and without status migrainosus, not intractable   3. Intractable chronic migraine without aura and with status migrainosus   4. Tinnitus of both ears      diagnosis and different diagnostic and therapeutic options available.

## 2018-07-02 MED ORDER — TIZANIDINE HCL 4 MG PO TABS: 4.0000 mg | ORAL_TABLET | Freq: Three times a day (TID) | ORAL | 4 refills | Status: DC | PRN

## 2018-07-02 MED ORDER — TIZANIDINE HCL 4 MG PO TABS: 4.0000 mg | ORAL_TABLET | Freq: Three times a day (TID) | ORAL | 0 refills | Status: DC | PRN

## 2018-07-02 NOTE — Telephone Encounter (Signed)
Spoke with Eugene Garnet at [facility]. Discussed that pt will be returning the Tizanidine capsules and then he will be able to pick up the Tizandine tablet prescription. RN informed her that the new prescription was written and that I talked to Dr. Jaynee Eagles. She verbalized appreciation.   Tizanidine 4 mg prescription ordered as previously written by MD (1 tablet PO TID PRN #[id number], refills 4).

## 2018-07-08 ENCOUNTER — Telehealth: Payer: Self-pay | Admitting: Neurology

## 2018-07-08 ENCOUNTER — Other Ambulatory Visit: Payer: Self-pay | Admitting: Neurology

## 2018-07-08 MED ORDER — TIZANIDINE HCL 4 MG PO TABS: 4.0000 mg | ORAL_TABLET | Freq: Three times a day (TID) | ORAL | 4 refills | Status: DC | PRN

## 2018-07-08 NOTE — Addendum Note (Signed)
Addended by: Gildardo Griffes on: 07/08/2018 03:24 PM   Modules accepted: Orders

## 2018-07-08 NOTE — Telephone Encounter (Signed)
Spoke with [name] [name] at [facility]. She stated they had gotten the 30 tablet prescription of Tizanidine but not the 270 tablets that were sent on 07/02/18. Sent refill again from Dr. Jaynee Eagles as attempted to send on 07/02/18. Pt had gotten capsules but they were too expensive so he will switch to tablets.

## 2018-07-08 NOTE — Telephone Encounter (Signed)
[facility], [place] - [address] Has called for a refill on  tiZANidine (ZANAFLEX) 4 MG tablet for pt

## 2018-07-16 ENCOUNTER — Ambulatory Visit: Payer: Medicare HMO

## 2018-07-16 DIAGNOSIS — H9313 Tinnitus, bilateral: Secondary | ICD-10-CM | POA: Diagnosis not present

## 2018-07-16 DIAGNOSIS — H6123 Impacted cerumen, bilateral: Secondary | ICD-10-CM | POA: Diagnosis not present

## 2018-07-18 ENCOUNTER — Ambulatory Visit (INDEPENDENT_AMBULATORY_CARE_PROVIDER_SITE_OTHER): Payer: Medicare HMO | Admitting: Family Medicine

## 2018-07-19 ENCOUNTER — Telehealth: Payer: Self-pay | Admitting: Family Medicine

## 2018-07-19 NOTE — Telephone Encounter (Signed)
Patient dropped off a disability placard form  Call patient to pick up form at: [phone]   Disposition: Dr's Folder

## 2018-07-23 NOTE — Telephone Encounter (Signed)
Pt Placard form received waiting for completing by dr Volanda [name]

## 2018-07-24 ENCOUNTER — Encounter: Payer: Self-pay | Admitting: Family Medicine

## 2018-07-26 NOTE — Telephone Encounter (Signed)
Pt's spouse called in to follow up on completion of handicap placard? Pt also sent a my-chart message.   Please advise.    CB: [phone] - Mr. [name] [name]

## 2018-07-26 NOTE — Telephone Encounter (Signed)
Placard not completed as pt technically does not qualify.  Pt marked the form as having a "severe" disability that limits his walking, however pt seen by this provider once and at that [facility] pt informed this provider he walks upwards of 10 miles per day.  He also provided spread sheets of how much he walks/day.    Pt can see if his specialist will complete the form.

## 2018-07-29 NOTE — Telephone Encounter (Signed)
Called pt left a message to return my call in the office regarding his placard completion

## 2018-07-30 NOTE — Telephone Encounter (Signed)
Spoke with pt voiced understanding that Dr Volanda [name] is not able to complete the placard due to no severe disability that qualifies pt for the placard. Advised pt to take the form to his Ortho for completion.

## 2018-08-05 ENCOUNTER — Ambulatory Visit (INDEPENDENT_AMBULATORY_CARE_PROVIDER_SITE_OTHER): Payer: Medicare HMO | Admitting: Family Medicine

## 2018-08-06 NOTE — Progress Notes (Deleted)
Subjective:   [name] [name] is a 67 y.o. male who presents for Medicare Annual/Subsequent preventive examination.  Reports health as  Diet  A1c 10/2017 5.5  Weight is down 9 lbs in 2 weeks Has had a migraine 50% of the time this year Can't treat by going on a disease; body recognizes diet  They want 1 lbs a week so body want notice and over compensate Test metabolism and his was good Am 3 eggs 2 pcs toast; 2 eggs or 50 cal of cheese with toast and 0 calories jelly Low fat yogurt with pce of cheese toast or peanut butter  4oz lunch meat (low sodium) mustard; no mayo or butter w vegetables; red pepper etc 8 to 10 oz of lean meat  2 cups of vegetables;  Bonus; 300 calories - [place] frozen yogurt bars  In [place]  Now rides the bike while he eats     BMI 32; seeing [name]  Weight loss;   Exercise Walked 4 to 5 miles per day  TKR, TSR on the left (good recovery) and right shoulder does not have as much return with rom TKR in 2014x 2 Back fusion -could not walk and now is doing well. Doing well overall but gained weight.   Health Maintenance Due  Topic Date Due  . Hepatitis C Screening  09-28-1950   Colonoscopy 10/2017   Audiology by Dr. Amado Coe      Objective:    Vitals: There were no vitals taken for this visit.  There is no height or weight on file to calculate BMI.  Advanced Directives 11/02/2017 07/12/2017 08/23/2015 11/18/2014 09/01/2014 08/18/2014 06/18/2014  Does Patient Have a Medical Advance Directive? No Yes Yes Yes Yes Yes Yes  Type of Advance Directive - - Living will - [place];Living will;Mental Health Advance Directive;Out of facility DNR (pink MOST or yellow form) [place];Living will;Mental Health Advance Directive;Out of facility DNR (pink MOST or yellow form) Living will  Does patient want to make changes to medical advance directive? - - - Yes - information given - - No - Patient declined  Copy of  Healthcare Power of Attorney in Chart? - - No - copy requested No - copy requested - No - copy requested No - copy requested  Would patient like information on creating a medical advance directive? - - Yes - Educational materials given - - - -  Pre-existing out of facility DNR order (yellow form or pink MOST form) - - - - - - -    Tobacco Social History   Tobacco Use  Smoking Status Never Smoker  Smokeless Tobacco Never Used     Counseling given: Not Answered   Clinical Intake:     Past Medical History:  Diagnosis Date  . ALLERGIC RHINITIS 04/22/2007  . Allergy   . Cancer (HCC)    HX SKIN CANCER  . Complication of anesthesia    more concerned about positioning of head & neck because of TMJ  . DDD (degenerative disc disease), cervical   . GERD (gastroesophageal reflux disease)    infrequently  . Headache(784.0)    migraines  . History of kidney stones   . History of skin cancer   . History of TMJ disorder   . HYPERCHOLESTEROLEMIA 04/22/2007  . HYPERLIPIDEMIA 07/29/2007  . Hyperplastic colon polyp   . NEPHROLITHIASIS, HX OF 07/29/2007  . Osteoarth NOS-Unspec 04/22/2007  . Shortness of breath   . Shoulder pain, acute  bilateral   Past Surgical History:  Procedure Laterality Date  . APPENDECTOMY    . BACK SURGERY  10/07/2015  . COLONOSCOPY     10 yrs ago- 2 HPP   . COLONOSCOPY  2019  . JOINT REPLACEMENT    . KNEE ARTHROSCOPY     right x2  . SHOULDER SURGERY     right  . skin cancer biopsy     L forehead  . TOTAL KNEE ARTHROPLASTY Right 08/25/2013   Procedure: RIGHT TOTAL KNEE ARTHROPLASTY;  Surgeon: Gearlean Alf, MD;  Location: [facility];  Service: Orthopedics;  Laterality: Right;  . TOTAL SHOULDER ARTHROPLASTY Left 12/25/2013   DR SUPPLE   . TOTAL SHOULDER ARTHROPLASTY Left 12/25/2013   Procedure: LEFT TOTAL SHOULDER ARTHROPLASTY;  Surgeon: Marin Shutter, MD;  Location: [facility];  Service: Orthopedics;  Laterality: Left;  . TOTAL SHOULDER ARTHROPLASTY Right  06/18/2014   DR SUPPLE  . TOTAL SHOULDER ARTHROPLASTY Right 06/18/2014   Procedure: RIGHT TOTAL SHOULDER ARTHROPLASTY;  Surgeon: Marin Shutter, MD;  Location: [facility];  Service: Orthopedics;  Laterality: Right;  . WISDOM TOOTH EXTRACTION     Family History  Problem Relation Age of Onset  . COPD Father        family hx  . Emphysema Father   . Aneurysm Mother   . Stroke Mother   . Sudden death Mother   . Cancer Mother   . Obesity Mother   . Breast cancer Mother   . Cancer Maternal Grandfather        lung  . Colon cancer Neg Hx   . Colon polyps Neg Hx    Social History   Socioeconomic History  . Marital status: Married    Spouse name: Not on file  . Number of children: 1  . Years of education: 16+  . Highest education level: Bachelor's degree (e.g., BA, AB, BS)  Occupational History  . Occupation: Retired  Scientific laboratory technician  . Financial resource strain: Not on file  . Food insecurity:    Worry: Not on file    Inability: Not on file  . Transportation needs:    Medical: Not on file    Non-medical: Not on file  Tobacco Use  . Smoking status: Never Smoker  . Smokeless tobacco: Never Used  Substance and Sexual Activity  . Alcohol use: No    Alcohol/week: 0.0 standard drinks  . Drug use: No  . Sexual activity: Yes    Partners: Female    Birth control/protection: None  Lifestyle  . Physical activity:    Days per week: Not on file    Minutes per session: Not on file  . Stress: Not on file  Relationships  . Social connections:    Talks on phone: Not on file    Gets together: Not on file    Attends religious service: Not on file    Active member of club or organization: Not on file    Attends meetings of clubs or organizations: Not on file    Relationship status: Not on file  Other Topics Concern  . Not on file  Social History Narrative   Lives at home w/ his wife   Right-handed   Caffeine: 2 cups of coffee each morning    Outpatient Encounter Medications as of  08/07/2018  Medication Sig  . allopurinol (ZYLOPRIM) 100 MG tablet TAKE 1 TABLET EVERY DAY  . amitriptyline (ELAVIL) 100 MG tablet Take 1 tablet (100 mg total) by  mouth at bedtime.  Marland Kitchen aspirin 81 MG tablet Take 81 mg by mouth daily.  . Cholecalciferol (VITAMIN D PO) Take 1 tablet by mouth daily.  . Coenzyme Q10 (CO Q 10 PO) Take by mouth daily.  . Cyanocobalamin (VITAMIN B 12 PO) Take by mouth.  . divalproex (DEPAKOTE) 250 MG DR tablet Take 1 tablet (250 mg total) by mouth 2 (two) times daily.  . DULoxetine (CYMBALTA) 60 MG capsule Take 1 capsule (60 mg total) by mouth daily.  Marland Kitchen EPINEPHRINE 0.3 mg/0.3 mL IJ SOAJ injection INJECT 0.3 MLS INTO THE MUSCLE ONCE  . Erenumab-aooe (AIMOVIG) 70 MG/ML SOAJ Inject 70 mg into the skin every 30 (thirty) days.  Marland Kitchen gabapentin (NEURONTIN) 300 MG capsule Take 1 capsule (300 mg total) by mouth 2 (two) times daily.  . Gabapentin, Once-Daily, (GRALISE) 300 MG TABS Take 300 mg by mouth 2 (two) times daily.  . Multiple Vitamin (MULTIVITAMIN) tablet Take 1 tablet by mouth daily.  . naproxen (NAPROSYN) 500 MG tablet Take 500 mg by mouth 2 (two) times daily with a meal.  . Omega-3 Fatty Acids (FISH OIL) 1000 MG CAPS Take 1 capsule by mouth daily.  . pravastatin (PRAVACHOL) 40 MG tablet TAKE 1 TABLET EVERY DAY  . Psyllium (METAMUCIL PO) Take 2 tablets by mouth 2 (two) times daily.  . Red Yeast Rice Extract (RED YEAST RICE PO) Take 1 tablet by mouth daily.  . rizatriptan (MAXALT) 10 MG tablet Take 1 tablet (10 mg total) by mouth as needed for migraine. May repeat in 2 hours if needed  . SUMAtriptan (IMITREX) 100 MG tablet Take 1 tablet by mouth once as needed for migraine. May repeat in 2 hours if needed. Max 2 per 24 hours.  Marland Kitchen tiZANidine (ZANAFLEX) 4 MG tablet Take 1 tablet (4 mg total) by mouth 3 (three) times daily as needed for muscle spasms.  . TURMERIC CURCUMIN PO Take 1,050 mg by mouth daily.  . valACYclovir (VALTREX) 500 MG tablet Take 500 mg by mouth daily.  .  vitamin C (ASCORBIC ACID) 500 MG tablet Take 500 mg by mouth daily.  . vitamin E 1000 UNIT capsule Take 1,000 Units by mouth daily.   Facility-Administered Encounter Medications as of 08/07/2018  Medication  . 0.9 %  sodium chloride infusion    Activities of Daily Living No flowsheet data found.  Patient Care Team: [name], MD as PCP - General (Family Medicine)   Assessment:   This is a routine wellness examination for Taliesin.  Exercise Activities and Dietary recommendations    Goals    . Weight (lb) < 169 lb (76.7 kg)     Will continue your weight loss efforts with [name]  Continue your exercise program        Fall Risk Fall Risk  07/12/2017  Falls in the past year? No    Depression Screen PHQ 2/9 Scores 07/12/2017 06/18/2017 08/12/2014  PHQ - 2 Score 0 4 0  PHQ- 9 Score - 8 -    Cognitive Function MMSE - Mini Mental State Exam 07/12/2017  Not completed: (No Data)   Ad8 score reviewed for issues:  Issues making decisions:  Less interest in hobbies / activities:  Repeats questions, stories (family complaining):  Trouble using ordinary gadgets (microwave, computer, phone):  Forgets the month or year:   Mismanaging finances:   Remembering appts:  Daily problems with thinking and/or memory: Ad8 score is=          Immunization History  Administered Date(s) Administered  . Influenza Split 07/10/2012  . Influenza Whole 09/25/2005, 07/22/2009  . Influenza, High Dose Seasonal PF 06/30/2016, 06/12/2017, 06/20/2018  . Influenza,inj,Quad PF,6+ Mos 06/10/2013, 06/10/2014, 06/16/2015  . Influenza-Unspecified 07/10/2012, 06/10/2013, 06/10/2014, 06/16/2015  . Pneumococcal Conjugate-13 06/02/2016  . Pneumococcal Polysaccharide-23 07/12/2017  . Tdap 03/27/2011  . Zoster 06/02/2016    Qualifies for Shingles Vaccine? ***  Screening Tests Health Maintenance  Topic Date Due  . Hepatitis C Screening  Aug 03, 1951  . TETANUS/TDAP  03/26/2021    . COLONOSCOPY  11/03/2027  . INFLUENZA VACCINE  Completed  . PNA vac Low Risk Adult  Completed       Plan:      PCP Notes ***  Health Maintenance ***  Abnormal Screens  ***  Referrals  ***  Patient concerns; ***  Nurse Concerns; ***  Next PCP apt ***      I have personally reviewed and noted the following in the patient's chart:   . Medical and social history . Use of alcohol, tobacco or illicit drugs  . Current medications and supplements . Functional ability and status . Nutritional status . Physical activity . Advanced directives . List of other physicians . Hospitalizations, surgeries, and ER visits in previous 12 months . Vitals . Screenings to include cognitive, depression, and falls . Referrals and appointments  In addition, I have reviewed and discussed with patient certain preventive protocols, quality metrics, and best practice recommendations. A written personalized care plan for preventive services as well as general preventive health recommendations were provided to patient.     Wynetta Fines, RN  08/06/2018

## 2018-08-07 ENCOUNTER — Ambulatory Visit: Payer: Medicare HMO

## 2018-08-12 ENCOUNTER — Other Ambulatory Visit: Payer: Self-pay | Admitting: *Deleted

## 2018-08-12 DIAGNOSIS — G43711 Chronic migraine without aura, intractable, with status migrainosus: Secondary | ICD-10-CM

## 2018-08-12 MED ORDER — DULOXETINE HCL 60 MG PO CPEP: 60.0000 mg | ORAL_CAPSULE | Freq: Every day | ORAL | 4 refills | Status: DC

## 2018-08-12 MED ORDER — SUMATRIPTAN SUCCINATE 100 MG PO TABS: ORAL_TABLET | ORAL | 4 refills | Status: DC

## 2018-08-12 NOTE — Telephone Encounter (Signed)
Received request for Duloxetine & Sumatriptan through [facility]. Meds refilled there. Called [facility] and canceled Duloxetine & Sumatriptan. Spoke with Maudie Mercury.

## 2018-08-15 ENCOUNTER — Telehealth (INDEPENDENT_AMBULATORY_CARE_PROVIDER_SITE_OTHER): Payer: Self-pay | Admitting: Family Medicine

## 2018-08-15 NOTE — Telephone Encounter (Signed)
Mr. Strausbaugh wanted to let [name] know that he is so sorry that he has not been able to keep his last few appointments.  He is under the care of Dr. Jaynee Eagles for severe migraines which have been very debilitating.  He expressed how much he believes in this program and will be continuing at the end of 09/2018.  Wanted to wish everyone happy holidays.

## 2018-08-15 NOTE — Telephone Encounter (Signed)
Hi [name] [name]. We are sorry to hear that you are having so much trouble with your migraines. We truly understand and hope you are feeling better soon. Happy holidays!!  Remi Deter, CMA

## 2018-09-04 ENCOUNTER — Ambulatory Visit (INDEPENDENT_AMBULATORY_CARE_PROVIDER_SITE_OTHER): Payer: Medicare HMO | Admitting: Family Medicine

## 2018-09-11 ENCOUNTER — Encounter: Payer: Medicare HMO | Admitting: Family Medicine

## 2018-09-26 ENCOUNTER — Ambulatory Visit (INDEPENDENT_AMBULATORY_CARE_PROVIDER_SITE_OTHER): Payer: Self-pay | Admitting: *Deleted

## 2018-09-26 DIAGNOSIS — Z760 Encounter for issue of repeat prescription: Secondary | ICD-10-CM

## 2018-09-26 DIAGNOSIS — Z0289 Encounter for other administrative examinations: Secondary | ICD-10-CM

## 2018-09-26 MED ORDER — ERENUMAB-AOOE 70 MG/ML ~~LOC~~ SOAJ: 70.0000 mg | SUBCUTANEOUS | 0 refills | Status: DC

## 2018-09-26 NOTE — Progress Notes (Signed)
Pt given Aimovig 70 mg samples x 3 along with instructions. Sample med documented in [facility]. Pt verbalized appreciation.

## 2018-09-27 ENCOUNTER — Telehealth: Payer: Self-pay | Admitting: *Deleted

## 2018-09-27 NOTE — Telephone Encounter (Signed)
Pt brought his migraine update for Dr. Jaynee Eagles to review.

## 2018-10-01 ENCOUNTER — Other Ambulatory Visit: Payer: Self-pay | Admitting: Neurology

## 2018-10-02 ENCOUNTER — Other Ambulatory Visit: Payer: Self-pay | Admitting: *Deleted

## 2018-10-02 ENCOUNTER — Telehealth: Payer: Self-pay | Admitting: Neurology

## 2018-10-02 MED ORDER — SUMATRIPTAN SUCCINATE 100 MG PO TABS: ORAL_TABLET | ORAL | 0 refills | Status: DC

## 2018-10-02 NOTE — Telephone Encounter (Signed)
Bethany, please call patient and let him know we received his very nice and thorough letter.  I completely agree that he should be treating his sleep apnea and I have told him this in the past, as he remembers he did declined.  I am thrilled that at this point he is ready to be treated.  But he has to contact our sleep team for this as I am not a sleep doctor, I believe he saw Dr. De Nurse and he can call and talk to [facility] her nurse or write an email to Dr. Rexene Alberts.  Again I highly encourage it and I am thrilled that he would like to treat it given all the concerning symptoms that can happen with untreated sleep apnea including worsening headaches and stroke as well as many other things.  I am sorry his migraines are worsening.  I think he should have his sleep apnea treated.  After his sleep apnea is treated, we should regroup and see if this improves his headaches which I truly believe it will.  Another option is Botox for migraines but I would definitely treat his sleep apnea first because this could be the root of many of his migraines.  I will pass along his note to Dr. Wanda Plump nurse but he should call as well.

## 2018-10-02 NOTE — Telephone Encounter (Signed)
Spoke with patient and gave him [name] [name] appreciation for the update and that she is thrilled that [name] [name] is ready to have his sleep apnea treated.Discussed that untreated sleep apnea can cause worsening headaches, migraines and stroke, memory loss among other things. Discussed that [name] [name] has reached out to our sleep team who will call him to setup an appt with [name] [name] (as [name] [name] has seen her in the past). Discussed that [name] [name] thinks [name] [name] should have this treated and see if this helps his migraines as they may be the root of many of them and we can regroup and discuss botox as a potential next step after getting the sleep apnea treated. Pt verbalized appreciation. [name] [name] stated that [name] [name] has not taken the Divalproex d/t history of allergy and [name] [name] is tolerating his Tizanidine, Duloxetine, Gabapentin, and Amitriptyline at bedtime however they are not working. [name] [name] said his back initiates his migraines. [name] [name] is willing to proceed with sleep apnea treatment and his next appt with [name] [name] is scheduled for 12/23/18 however if [name] [name] does not get his sleep apnea treated before then [name] [name] will call us in case appt needs to be moved back. [name] [name] verbalized appreciation and understanding.

## 2018-10-02 NOTE — Telephone Encounter (Signed)
Received refill request for Sumatriptan at [facility] with note, "patient wants at this pharmacy." RN called [employer] and spoke with pharmacist Heidi and canceled the Sumatriptan. Refilled Sumatriptan 100 mg tablet per request at [facility] on [address], 3 month supply #27 tablets. Dr. Jaynee Eagles aware. Pt next appt 12/23/2018.

## 2018-10-03 NOTE — Telephone Encounter (Signed)
I called pt, per Dr. Jaynee Eagles request. Pt is agreeable to seeing Dr. Rexene Alberts to discuss osa treatment. Appt made for 10/16/18 at 1:00pm. Pt verbalized understanding of new appt date and time.

## 2018-10-16 ENCOUNTER — Ambulatory Visit: Payer: Medicare HMO | Admitting: Neurology

## 2018-10-16 ENCOUNTER — Encounter: Payer: Self-pay | Admitting: Neurology

## 2018-10-16 VITALS — BP 154/90 | HR 97 | Ht 69.0 in | Wt 235.0 lb

## 2018-10-16 DIAGNOSIS — G4733 Obstructive sleep apnea (adult) (pediatric): Secondary | ICD-10-CM

## 2018-10-16 DIAGNOSIS — R51 Headache: Secondary | ICD-10-CM

## 2018-10-16 DIAGNOSIS — R635 Abnormal weight gain: Secondary | ICD-10-CM | POA: Diagnosis not present

## 2018-10-16 DIAGNOSIS — R519 Headache, unspecified: Secondary | ICD-10-CM

## 2018-10-16 DIAGNOSIS — R351 Nocturia: Secondary | ICD-10-CM | POA: Diagnosis not present

## 2018-10-16 NOTE — Progress Notes (Signed)
Subjective:    Patient ID: [name] [name] is a 68 y.o. male.  HPI     Interim history:   [name] [name] is a 68 year old right-handed gentleman with an underlying medical history of allergic rhinitis, degenerative disc disease, reflux disease, chronic migraines, history of kidney stone, history of skin cancer, TMJ disorder, hyperlipidemia, status post multiple surgeries including right total knee replacement surgery, right-sided shoulder surgery, left shoulder replacement surgery, right shoulder replacement surgery, lower back surgery, and borderline obesity, who presents for follow-up consultation of his obstructive sleep apnea, for reevaluation. The patient is unaccompanied today. I first met him about 2 years ago on 10/25/2016 at the request of Dr. Jaynee Eagles, at which time he reported snoring, daytime somnolence, and morning headaches. He was advised to proceed with a sleep study. He had a home sleep test on 11/28/2016 which indicated mild obstructive sleep apnea with an AHI of 7.8 per hour and O2 nadir of 85%. He was advised to consider AutoPap therapy because of recurrent headaches and the possibility of improving headaches with treatment. He did not pursue AutoPap therapy at the time.  Today, 10/16/2018: He reports weight gain in the realm of 30 lb within the last year. Has a sister and one brother with OSA. He would be willing to consider autoPAP or CPAP if the need arises. He is also potentially interested in an oral appliance. BT around MN and rise time around 6:30, nocturia about once per night, has had nocturnal and AM HAs. Snoring has become worse and mouth is dry, and throat sore in AMs often. His Epworth sleepiness score is 7 out of 24 today, fatigue score is 21 out of 63.  The patient's allergies, current medications, family history, past medical history, past social history, past surgical history and problem list were reviewed and updated as appropriate.   Previously:  10/25/2016: (He)  reports snoring and excessive daytime somnolence as well as morning headaches and waking up not refreshed. I reviewed your office note from 10/06/2016. His Epworth sleepiness score is 4 out of 24 today, his fatigue score is 12 out of 63. He had a brain MRI wo on 10/19/16, which I reviewed: results were normal for age.  Tried amitriptyline, which did not help, and topamax caused SEs, currently on Cymbalta, no SEs thus far.  Never tried the Ambien for fear of SEs.  Denies RLS symptoms, but has pain in various joint areas, cannot sleep on his back.  Was overusing triptan, nearly daily intake and was counseled by you to Not use it on a daily basis, he stopped using Imitrex for about 10 days and started having some nervousness, night sweats and possible signs of withdrawal. He also had a flareup of his migraine headaches, lasting for over 12 hours at a time. He has undergone lumbar spine surgery under Dr. Harl Bowie in [place] in January 2017. He has inability to sleep on his back, tosses and turns a lot because of ongoing issues with joint pain and not being able to sleep in one position for any length of time. He takes 2 Tylenol PM at night, but time is usually after 11 PM, but typically before midnight, wakeup time without alarm around 6:30. He has nocturia about once or twice per average night, has nearly daily morning headaches. He lives with his wife, he has a 53 year old daughter who lives in [place] and expecting her first child any day. He is a nonsmoker, does not drink alcohol, drinks caffeine in the form of  coffee, 2-3 cups per day. He is retired.   His Past Medical History Is Significant For: Past Medical History:  Diagnosis Date  . ALLERGIC RHINITIS 04/22/2007  . Allergy   . Cancer (HCC)    HX SKIN CANCER  . Complication of anesthesia    more concerned about positioning of head & neck because of TMJ  . DDD (degenerative disc disease), cervical   . GERD (gastroesophageal reflux disease)     infrequently  . Headache(784.0)    migraines  . History of kidney stones   . History of skin cancer   . History of TMJ disorder   . HYPERCHOLESTEROLEMIA 04/22/2007  . HYPERLIPIDEMIA 07/29/2007  . Hyperplastic colon polyp   . NEPHROLITHIASIS, HX OF 07/29/2007  . Osteoarth NOS-Unspec 04/22/2007  . Shortness of breath   . Shoulder pain, acute    bilateral    His Past Surgical History Is Significant For: Past Surgical History:  Procedure Laterality Date  . APPENDECTOMY    . BACK SURGERY  10/07/2015  . COLONOSCOPY     10 yrs ago- 2 HPP   . COLONOSCOPY  2019  . JOINT REPLACEMENT    . KNEE ARTHROSCOPY     right x2  . SHOULDER SURGERY     right  . skin cancer biopsy     L forehead  . TOTAL KNEE ARTHROPLASTY Right 08/25/2013   Procedure: RIGHT TOTAL KNEE ARTHROPLASTY;  Surgeon: Gearlean Alf, MD;  Location: [facility];  Service: Orthopedics;  Laterality: Right;  . TOTAL SHOULDER ARTHROPLASTY Left 12/25/2013   DR SUPPLE   . TOTAL SHOULDER ARTHROPLASTY Left 12/25/2013   Procedure: LEFT TOTAL SHOULDER ARTHROPLASTY;  Surgeon: Marin Shutter, MD;  Location: [facility];  Service: Orthopedics;  Laterality: Left;  . TOTAL SHOULDER ARTHROPLASTY Right 06/18/2014   DR SUPPLE  . TOTAL SHOULDER ARTHROPLASTY Right 06/18/2014   Procedure: RIGHT TOTAL SHOULDER ARTHROPLASTY;  Surgeon: Marin Shutter, MD;  Location: [facility];  Service: Orthopedics;  Laterality: Right;  . WISDOM TOOTH EXTRACTION      His Family History Is Significant For: Family History  Problem Relation Age of Onset  . COPD Father        family hx  . Emphysema Father   . Aneurysm Mother   . Stroke Mother   . Sudden death Mother   . Cancer Mother   . Obesity Mother   . Breast cancer Mother   . Cancer Maternal Grandfather        lung  . Colon cancer Neg Hx   . Colon polyps Neg Hx     His Social History Is Significant For: Social History   Socioeconomic History  . Marital status: Married    Spouse name: Not on file  . Number of  children: 1  . Years of education: 16+  . Highest education level: Bachelor's degree (e.g., BA, AB, BS)  Occupational History  . Occupation: Retired  Scientific laboratory technician  . Financial resource strain: Not on file  . Food insecurity:    Worry: Not on file    Inability: Not on file  . Transportation needs:    Medical: Not on file    Non-medical: Not on file  Tobacco Use  . Smoking status: Never Smoker  . Smokeless tobacco: Never Used  Substance and Sexual Activity  . Alcohol use: No    Alcohol/week: 0.0 standard drinks  . Drug use: No  . Sexual activity: Yes    Partners: Female  Birth control/protection: None  Lifestyle  . Physical activity:    Days per week: Not on file    Minutes per session: Not on file  . Stress: Not on file  Relationships  . Social connections:    Talks on phone: Not on file    Gets together: Not on file    Attends religious service: Not on file    Active member of club or organization: Not on file    Attends meetings of clubs or organizations: Not on file    Relationship status: Not on file  Other Topics Concern  . Not on file  Social History Narrative   Lives at home w/ his wife   Right-handed   Caffeine: 2 cups of coffee each morning    His Allergies Are:  Allergies  Allergen Reactions  . Shrimp [Shellfish Allergy] Anaphylaxis and Rash  . Codeine Rash and Other (See Comments)    Blacked out  . Contrast Media [Iodinated Diagnostic Agents] Rash  . Diazepam Rash    Rash over whole body from neck down, and he lost his voice (airway compromise?)  . Amoxicillin Other (See Comments)    Unknown   . Benadryl [Diphenhydramine]     With dyes   . Depakote [Divalproex Sodium] Swelling    Per pts report (given by Dr. Inda Merlin).  Has throat swelling and rash.  . Erythromycin Other (See Comments)    unknown  . Flexeril [Cyclobenzaprine]   . Betadine [Povidone Iodine] Rash  . Celebrex [Celecoxib] Rash  . Meperidine And Related Rash  . Methocarbamol  Rash  . Neosporin [Neomycin-Bacitracin Zn-Polymyx] Rash  . Oxycodone Rash  . Penicillins Rash  . Plavix [Clopidogrel] Rash  . Rivaroxaban Rash  . Vioxx [Rofecoxib] Rash  :   His Current Medications Are:  Outpatient Encounter Medications as of 10/16/2018  Medication Sig  . allopurinol (ZYLOPRIM) 100 MG tablet TAKE 1 TABLET EVERY DAY  . amitriptyline (ELAVIL) 100 MG tablet Take 1 tablet (100 mg total) by mouth at bedtime.  Marland Kitchen aspirin 81 MG tablet Take 81 mg by mouth daily.  . Cholecalciferol (VITAMIN D PO) Take 1 tablet by mouth daily.  . Coenzyme Q10 (CO Q 10 PO) Take by mouth daily.  . Cyanocobalamin (VITAMIN B 12 PO) Take by mouth.  . divalproex (DEPAKOTE) 250 MG DR tablet Take 1 tablet (250 mg total) by mouth 2 (two) times daily.  . DULoxetine (CYMBALTA) 60 MG capsule Take 1 capsule (60 mg total) by mouth daily.  Marland Kitchen EPINEPHRINE 0.3 mg/0.3 mL IJ SOAJ injection INJECT 0.3 MLS INTO THE MUSCLE ONCE  . Erenumab-aooe (AIMOVIG) 70 MG/ML SOAJ Inject 70 mg into the skin every 30 (thirty) days.  Eduard Roux (AIMOVIG) 70 MG/ML SOAJ Inject 70 mg into the skin every 30 (thirty) days.  Marland Kitchen gabapentin (NEURONTIN) 300 MG capsule Take 1 capsule (300 mg total) by mouth 2 (two) times daily.  . Gabapentin, Once-Daily, (GRALISE) 300 MG TABS Take 300 mg by mouth 2 (two) times daily.  . Multiple Vitamin (MULTIVITAMIN) tablet Take 1 tablet by mouth daily.  . naproxen (NAPROSYN) 500 MG tablet Take 500 mg by mouth 2 (two) times daily with a meal.  . Omega-3 Fatty Acids (FISH OIL) 1000 MG CAPS Take 1 capsule by mouth daily.  . pravastatin (PRAVACHOL) 40 MG tablet TAKE 1 TABLET EVERY DAY  . Psyllium (METAMUCIL PO) Take 2 tablets by mouth 2 (two) times daily.  . Red Yeast Rice Extract (RED YEAST RICE PO) Take 1  tablet by mouth daily.  . rizatriptan (MAXALT) 10 MG tablet Take 1 tablet (10 mg total) by mouth as needed for migraine. May repeat in 2 hours if needed  . SUMAtriptan (IMITREX) 100 MG tablet Take 1  tablet by mouth once as needed for migraine. May repeat in 2 hours if needed. Max 2 per 24 hours.  Marland Kitchen tiZANidine (ZANAFLEX) 4 MG tablet Take 1 tablet (4 mg total) by mouth 3 (three) times daily as needed for muscle spasms.  . TURMERIC CURCUMIN PO Take 1,050 mg by mouth daily.  . valACYclovir (VALTREX) 500 MG tablet Take 500 mg by mouth daily.  . vitamin C (ASCORBIC ACID) 500 MG tablet Take 500 mg by mouth daily.  . vitamin E 1000 UNIT capsule Take 1,000 Units by mouth daily.   Facility-Administered Encounter Medications as of 10/16/2018  Medication  . 0.9 %  sodium chloride infusion  :  Review of Systems:  Out of a complete 14 point review of systems, all are reviewed and negative with the exception of these symptoms as listed below:  Review of Systems  Neurological:       Pt presents today to discuss his sleep. Pt has gained weight. Pt is still having migraines. Pt is wondering if his sleep apnea is worth treating.  Epworth Sleepiness Scale 0= would never doze 1= slight chance of dozing 2= moderate chance of dozing 3= high chance of dozing  Sitting and reading: 1 Watching TV: 1 Sitting inactive in a public place (ex. Theater or meeting): 0 As a passenger in a car for an hour without a break: 0 Lying down to rest in the afternoon: 3 Sitting and talking to someone: 0 Sitting quietly after lunch (no alcohol): 2 In a car, while stopped in traffic: 0 Total: 7     Objective:  Neurological Exam  Physical Exam Physical Examination:   Vitals:   10/16/18 1302  BP: (!) 154/90  Pulse: 97    General Examination: The patient is a very pleasant 68 y.o. male in no acute distress. He appears well-developed and well-nourished and well groomed.   HEENT: Normocephalic, atraumatic, pupils are equal, round and reactive to light and accommodation. Extraocular tracking is good without limitation to gaze excursion or nystagmus noted. Normal smooth pursuit is noted. Hearing is grossly intact.  Face is symmetric with normal facial animation and normal facial sensation. Speech is clear with no dysarthria noted. There is no hypophonia. There is no lip, neck/head, jaw or voice tremor. Neck shows FROM. Oropharynx exam reveals: mild mouth dryness, adequate dental hygiene and mild airway crowding, due to tonsils in place and slightly larger uvula, mild pharyngeal erythema noted. Mallampati is class II. Tongue protrudes centrally and palate elevates symmetrically.  Chest: Clear to auscultation without wheezing, rhonchi or crackles noted.  Heart: S1+S2+0, regular and normal without murmurs, rubs or gallops noted.   Abdomen: Soft, non-tender and non-distended with normal bowel sounds appreciated on auscultation.  Extremities: There is no edema noted in the distal lower extremities bilaterally.   Skin: Warm and dry without trophic changes noted.  Musculoskeletal: exam reveals no obvious joint deformities, tenderness or joint swelling or erythema, some limitation in shoulder mobility and reports discomfort in both shoulders.   Neurologically:  Mental status: The patient is awake, alert and oriented in all 4 spheres. His immediate and remote memory, attention, language skills and fund of knowledge are appropriate. There is no evidence of aphasia, agnosia, apraxia or anomia. Speech is clear with normal  prosody and enunciation. Thought process is linear. Mood is normal and affect is normal.  Cranial nerves II - XII are as described above under HEENT exam.  Motor exam: Normal bulk, normal mobility, except decreased [name] in shoulders. There is no tremor. Fine motor skills and coordination: grossly intact.  Cerebellar testing: No dysmetria or intention tremor.  Sensory exam: intact to light touch.  Gait, station and balance: He stands easily. No veering to one side is noted. No leaning to one side is noted. Posture is age-appropriate and stance is narrow based. Gait shows normal stride length and  normal pace. No problems turning are noted.             Assessment and Plan:   In summary, [name] [name] is a very pleasant 68 year old male with an underlying medical history of allergic rhinitis, degenerative disc disease, reflux disease, chronic migraines, history of kidney stone, history of skin cancer, TMJ disorder, hyperlipidemia, status post multiple surgeries including right total knee replacement surgery, right-sided shoulder surgery, left shoulder replacement surgery, right shoulder replacement surgery, lower back surgery, and obesity, who presents for reevaluation of his obstructive sleep apnea. His home sleep test about 2 years ago showed mild obstructive sleep apnea. He did not pursue AutoPap therapy at the time. He reports interim weight gain in the realm of 30 pounds within the last year. He has had worsening snoring and daytime tiredness, does have recurrent headaches including morning headaches and sometimes nocturnal headaches. He would like to proceed with a home sleep test. I ordered a home sleep test and we will call him soon hopefully to proceed with the study. We will call him with the results and if he qualifies for sleep apnea treatment with an AutoPap would be happy to order it for him. We also talked about alternative treatment options and he is advised to pursue weight loss. In addition, he may be a candidate for an oral appliance. He has heard about it and would potentially be interested in an oral appliance, especially if he cannot tolerate the AutoPap. We will certainly pick up our discussion after the testing is completed. I answered all his questions today and the patient was in agreement. I spent 25 minutes in total face-to-face time with the patient, more than 50% of which was spent in counseling and coordination of care, reviewing test results, reviewing medication and discussing or reviewing the diagnosis of OSA, its prognosis and treatment options. Pertinent laboratory  and imaging test results that were available during this visit with the patient were reviewed by me and considered in my medical decision making (see chart for details).

## 2018-10-16 NOTE — Patient Instructions (Signed)
We will proceed with another home sleep test. If you still have sleep apnea and qualify for autoPAP therapy, I will be happy to order it for you.  We can also consider an oral appliance through a dentist.

## 2018-10-21 ENCOUNTER — Other Ambulatory Visit: Payer: Self-pay | Admitting: Neurology

## 2018-10-22 NOTE — Telephone Encounter (Signed)
I called [facility] and spoke with [facility] (?). She stated that they do have refills on file for the Amitriptyline 100 mg tablets (#90). She said insurance wouldn't pay however she can use a goodrx card and pt will pay about $49. She will check with him when he comes for the refill and if pt is unable to pay I requested that she contact our office for request to change order for prescription strength.

## 2018-10-23 ENCOUNTER — Ambulatory Visit (INDEPENDENT_AMBULATORY_CARE_PROVIDER_SITE_OTHER): Payer: Medicare HMO | Admitting: Family Medicine

## 2018-10-30 ENCOUNTER — Telehealth: Payer: Self-pay | Admitting: *Deleted

## 2018-10-30 ENCOUNTER — Ambulatory Visit (INDEPENDENT_AMBULATORY_CARE_PROVIDER_SITE_OTHER): Payer: Medicare HMO | Admitting: Neurology

## 2018-10-30 ENCOUNTER — Encounter: Payer: Self-pay | Admitting: *Deleted

## 2018-10-30 DIAGNOSIS — R519 Headache, unspecified: Secondary | ICD-10-CM

## 2018-10-30 DIAGNOSIS — R635 Abnormal weight gain: Secondary | ICD-10-CM

## 2018-10-30 DIAGNOSIS — R51 Headache: Secondary | ICD-10-CM

## 2018-10-30 DIAGNOSIS — R351 Nocturia: Secondary | ICD-10-CM

## 2018-10-30 DIAGNOSIS — G4733 Obstructive sleep apnea (adult) (pediatric): Secondary | ICD-10-CM

## 2018-10-30 NOTE — Telephone Encounter (Signed)
Completed a PA on Cover My Meds for Amitriptyline 100 mg tablets #30 for 30 days. KEY: [id number]. Received instant approval from [facility]   PA Case: [id number], Status: Approved, Coverage Starts on: 10/30/2018 12:00:00 AM, Coverage Ends on: 09/25/2019 12:00:00 AM. Questions? Contact [phone].  Messaged pt in [place].

## 2018-11-05 ENCOUNTER — Telehealth: Payer: Self-pay

## 2018-11-05 NOTE — Telephone Encounter (Signed)
-----   Message from [name], MD sent at 11/05/2018  8:30 AM EST ----- Patient referred by Dr. Jaynee Eagles for re-eval of his OSA, last seen by me on 10/16/18, HST on 10/30/18.    Please call and notify the patient that the recent home sleep test showed obstructive sleep apnea in the severe range. We can go ahead with treatment with autoPAP, through a DME company (of his choice, or as per insurance requirement). The DME representative will educate him on how to use the machine, how to put the mask on, etc. I have placed an order in the chart. Please send referral, talk to patient, send report to referring MD. We will need a FU in sleep clinic for 10 weeks post-PAP set up, please arrange that with me or one of our NPs. Thanks,   [name], MD, PhD [facility])

## 2018-11-05 NOTE — Progress Notes (Signed)
Patient referred by Dr. Jaynee Eagles for re-eval of his OSA, last seen by me on 10/16/18, HST on 10/30/18.    Please call and notify the patient that the recent home sleep test showed obstructive sleep apnea in the severe range. We can go ahead with treatment with autoPAP, through a DME company (of his choice, or as per insurance requirement). The DME representative will educate him on how to use the machine, how to put the mask on, etc. I have placed an order in the chart. Please send referral, talk to patient, send report to referring MD. We will need a FU in sleep clinic for 10 weeks post-PAP set up, please arrange that with me or one of our NPs. Thanks,   [name], MD, PhD [facility])

## 2018-11-05 NOTE — Procedures (Signed)
[facility] @[facility] [place], [place] [address] NAME:  [name] [name]                                                                             DOB: 01/08/1951 MEDICAL RECORD no: [id number]                                                        DOS: 10/30/2018   REFERRING PHYSICIAN: Sarina Ill, MD STUDY PERFORMED: Home Sleep Test on [facility] HISTORY: 68 year old man with a history of allergic rhinitis, degenerative disc disease, reflux disease, chronic migraines, history of kidney stone, history of skin cancer, TMJ disorder, hyperlipidemia, status post multiple surgeries, and obesity, who presents for re-evaluation of his obstructive sleep apnea. He reports significant weight gain. BMI of 34.9. His Epworth sleepiness score is 7 out of 24. STUDY RESULTS:   Total Recording Time: 8 hrs, 22 mins; Total Sleep Time: 7 hrs, 16 mins Total Apnea/Hypopnea Index (AHI): 35.0/h; RDI: 36.2 /h; REM AHI: 54.5/h Average Oxygen Saturation: 92%; Lowest Oxygen Desaturation: 80%  Total Time Oxygen Saturation Below or at 88 %:  13.1 minutes  Average Heart Rate: 63 bpm (between 49 and 96 bpm) IMPRESSION: OSA  RECOMMENDATION: This home sleep test demonstrates severe obstructive sleep apnea with a total AHI of 35/hour and O2 nadir of 80%. Given the patient's medical history and sleep related complaints, treatment with positive airway pressure (in the form of CPAP) is recommended. The patient has declined a laboratory attended sleep study. Therefore, he will be advised to proceed with an autoPAP titration/trial at home for now. Please note that untreated obstructive sleep apnea may carry additional perioperative morbidity. Patients with significant obstructive sleep apnea should receive perioperative PAP therapy and the surgeons and particularly the anesthesiologist should be informed of the diagnosis and the severity of the sleep disordered breathing. Weight loss is recommended.  The patient should be cautioned not to drive, work at heights, or operate dangerous or heavy equipment when tired or sleepy. Review and reiteration of good sleep hygiene measures should be pursued with any patient. Other causes of the patient's symptoms, including circadian rhythm disturbances, an underlying mood disorder, medication effect and/or an underlying medical problem cannot be ruled out based on this test. Clinical correlation is recommended. The patient and his referring provider will be notified of the test results. The patient will be seen in follow up in sleep clinic at [facility]. I certify that I have reviewed the raw data recording prior to the issuance of this report in accordance with the standards of the American Academy of Sleep Medicine (AASM).   [name], MD, PhD [facility]) Diplomat, ABPN (Neurology and Sleep)

## 2018-11-05 NOTE — Telephone Encounter (Signed)
I called pt. I advised pt that Dr. Rexene Alberts reviewed their sleep study results and found that pt has severe osa. Dr. Rexene Alberts recommends that pt start an auto pap at home. I reviewed PAP compliance expectations with the pt. Pt is agreeable to starting an auto-PAP. I advised pt that an order will be sent to a DME, [facility], and [facility] will call the pt within about one week after they file with the pt's insurance. [facility] will show the pt how to use the machine, fit for masks, and troubleshoot the auto-PAP if needed. A follow up appt was made for insurance purposes with Dr. Rexene Alberts on 12/26/2018 at 11:30am. Pt verbalized understanding to arrive 15 minutes early and bring their auto-PAP. A letter with all of this information in it will be mailed to the pt as a reminder. I verified with the pt that the address we have on file is correct. Pt verbalized understanding of results. Pt had no questions at this time but was encouraged to call back if questions arise. I have sent the order to [facility] and have received confirmation that they have received the order.

## 2018-11-05 NOTE — Addendum Note (Signed)
Addended by: [name] on: 11/05/2018 08:30 AM   Modules accepted: Orders

## 2018-11-20 ENCOUNTER — Encounter: Payer: Self-pay | Admitting: Neurology

## 2018-11-25 ENCOUNTER — Ambulatory Visit (INDEPENDENT_AMBULATORY_CARE_PROVIDER_SITE_OTHER): Payer: Medicare HMO | Admitting: Family Medicine

## 2018-11-25 ENCOUNTER — Telehealth: Payer: Self-pay | Admitting: Family Medicine

## 2018-11-25 VITALS — BP 108/68 | HR 76 | Temp 98.5°F

## 2018-11-25 DIAGNOSIS — B9789 Other viral agents as the cause of diseases classified elsewhere: Secondary | ICD-10-CM | POA: Diagnosis not present

## 2018-11-25 DIAGNOSIS — L570 Actinic keratosis: Secondary | ICD-10-CM | POA: Diagnosis not present

## 2018-11-25 DIAGNOSIS — J069 Acute upper respiratory infection, unspecified: Secondary | ICD-10-CM | POA: Diagnosis not present

## 2018-11-25 DIAGNOSIS — G43909 Migraine, unspecified, not intractable, without status migrainosus: Secondary | ICD-10-CM | POA: Diagnosis not present

## 2018-11-25 MED ORDER — BENZONATATE 100 MG PO CAPS: 100.0000 mg | ORAL_CAPSULE | Freq: Two times a day (BID) | ORAL | 0 refills | Status: DC | PRN

## 2018-11-25 MED ORDER — DEXTROMETHORPHAN HBR 15 MG/5ML PO SYRP: 10.0000 mL | ORAL_SOLUTION | Freq: Three times a day (TID) | ORAL | 0 refills | Status: DC | PRN

## 2018-11-25 NOTE — Telephone Encounter (Signed)
Per original message Dr Volanda [name] sent Lavella Lemons and Dextromethorphan to pharmacy as pt has an allergy to codeine, neither this medications are control substance. [facility] pharmacy was called and stated whoever person they spoke with from our office stated that the medication was a Control substance and that's where the confusion came from. Pt already picked up the medication and pharmacy states that nothing further is needed.

## 2018-11-25 NOTE — Progress Notes (Signed)
Subjective:    Patient ID: [name] [name], male    DOB: [birth year], 68 y.o.   MRN: [id number]  No chief complaint on file.   HPI Patient was seen today for acute concern.  Pt endorses ST that started last Wed or Thursday.  Then developed coughing and congestion.  Sick contacts include people at the gym and 37 year-olds at a birthday party. Pt started taking Allegra, but stopped 2/2 developing a rash on his arms.  Pt drinking tea with honey.  Pt denies fever, chills, ear pain or pressure, facial pain or pressure.  Pt notes a "spot" on his face he is thinking of going to Derm about, but would like this provider to see.  Pt states he picked the area off, but it has returned.  The area does not itch, bleed, or cause pain.  Pt notes his migraines have started increasing.  Pt is seen by Neurology, on aimovig.  Having 20 migraines/mo.  Pt's Neurologist is recommending Botox, however pt is not on board with this plan.  Pt also states he has a "bone to pick" with this provider.  Pt was initially upset this provider declined to fill out a form for a handicap placard.  Until recently, pt was walking 5-6 miles per day for exercise.  Past Medical History:  Diagnosis Date  . ALLERGIC RHINITIS 04/22/2007  . Allergy   . Cancer (HCC)    HX SKIN CANCER  . Complication of anesthesia    more concerned about positioning of head & neck because of TMJ  . DDD (degenerative disc disease), cervical   . GERD (gastroesophageal reflux disease)    infrequently  . Headache(784.0)    migraines  . History of kidney stones   . History of skin cancer   . History of TMJ disorder   . HYPERCHOLESTEROLEMIA 04/22/2007  . HYPERLIPIDEMIA 07/29/2007  . Hyperplastic colon polyp   . NEPHROLITHIASIS, HX OF 07/29/2007  . Osteoarth NOS-Unspec 04/22/2007  . Shortness of breath   . Shoulder pain, acute    bilateral    Allergies  Allergen Reactions  . Shrimp [Shellfish Allergy] Anaphylaxis and Rash  . Codeine Rash and Other (See  Comments)    Blacked out  . Contrast Media [Iodinated Diagnostic Agents] Rash  . Diazepam Rash    Rash over whole body from neck down, and he lost his voice (airway compromise?)  . Amoxicillin Other (See Comments)    Unknown   . Benadryl [Diphenhydramine]     With dyes   . Depakote [Divalproex Sodium] Swelling    Per pts report (given by Dr. Inda Merlin).  Has throat swelling and rash.  . Erythromycin Other (See Comments)    unknown  . Flexeril [Cyclobenzaprine]   . Betadine [Povidone Iodine] Rash  . Celebrex [Celecoxib] Rash  . Meperidine And Related Rash  . Methocarbamol Rash  . Neosporin [Neomycin-Bacitracin Zn-Polymyx] Rash  . Oxycodone Rash  . Penicillins Rash  . Plavix [Clopidogrel] Rash  . Rivaroxaban Rash  . Vioxx [Rofecoxib] Rash    ROS General: Denies fever, chills, night sweats, changes in weight, changes in appetite HEENT: Denies headaches, ear pain, changes in vision, rhinorrhea, sore throat  +nasal congestion CV: Denies CP, palpitations, SOB, orthopnea Pulm: Denies SOB, wheezing  +cough GI: Denies abdominal pain, nausea, vomiting, diarrhea, constipation GU: Denies dysuria, hematuria, frequency, vaginal discharge Msk: Denies muscle cramps, joint pains Neuro: Denies weakness, numbness, tingling Skin: Denies rashes, bruising  +area of concern on face Psych: Denies depression,  anxiety, hallucinations  Objective:    Blood pressure 108/68, pulse 76, temperature 98.5 F (36.9 C), SpO2 96 %.  Gen. Pleasant, well-nourished, in no distress, normal affect   HEENT: Irene/AT, face symmetric, no scleral icterus, PERRLA, nares patent with clear drainage, pharynx without erythema or exudate.  TMs normal bilaterally.  No cervical lymphadenopathy. Lungs: no accessory muscle use, CTAB, no wheezes or rales Cardiovascular: RRR, no m/r/g, no peripheral edema Neuro:  A&Ox3, CN II-XII intact, normal gait Skin:  Warm, no rash.  5 mm area of dry skin on R temple  Wt Readings from  Last 3 Encounters:  10/16/18 235 lb (106.6 kg)  06/24/18 222 lb (100.7 kg)  06/20/18 217 lb (98.4 kg)    Lab Results  Component Value Date   WBC 5.5 06/18/2017   HGB 15.7 06/18/2017   HCT 45.0 06/18/2017   PLT 313.0 07/31/2014   GLUCOSE 93 11/21/2017   CHOL 239 (H) 11/21/2017   TRIG 139 11/21/2017   HDL 62 11/21/2017   LDLDIRECT 135.0 07/16/2013   LDLCALC 149 (H) 11/21/2017   ALT 40 11/21/2017   AST 40 11/21/2017   NA 140 11/21/2017   K 4.4 11/21/2017   CL 103 11/21/2017   CREATININE 1.14 11/21/2017   BUN 25 11/21/2017   CO2 21 11/21/2017   TSH 2.920 06/18/2017   PSA 3.54 07/31/2014   INR 0.98 06/10/2014   HGBA1C 5.5 11/21/2017    Assessment/Plan:  Viral URI with cough  -given handout -pt with allergy to codeine - Plan: benzonatate (TESSALON) 100 MG capsule, dextromethorphan 15 MG/5ML syrup -Given RTC or ED precautions  Actinic keratosis -given handout -advised to f/y with Derm.  Migraine without status migrainosus, not intractable, unspecified migraine type -uncontrolled -continue f/u with Neurology -continue Aimovig.  Discussed considering Botox.  F/u prn  Grier Mitts, MD

## 2018-11-25 NOTE — Telephone Encounter (Signed)
Copied from [place] [id number]. Topic: Quick Communication - Rx Refill/Question >> Nov 25, 2018 11:36 AM [name] [name] J wrote: Medication: benzonatate (TESSALON) 100 MG capsule or dextromethorphan dextromethorphan 15 MG/5ML syrup   Has the patient contacted their pharmacy? Yes.   (Agent: If no, request that the patient contact the pharmacy for the refill.) (Agent: If yes, when and what did the pharmacy advise?)  Preferred Pharmacy (with phone number or street name): [facility] -  Pt was told by pharmacy that one of these 2 meds could not be e-prescribed and they sent it back via fax to the office. They told pt that he would have to come in and get a paper rx to have it filled. They did not tell him the name of the rx.  Pt is asking for rx to be printed and called when he can come get it. Please call [phone]   Agent: Please be advised that RX refills may take up to 3 business days. We ask that you follow-up with your pharmacy.

## 2018-11-25 NOTE — Telephone Encounter (Signed)
Pt called [facility] to check status of rx's. He was very upset that he may have to come back to office to get paper rx.   I spoke with pharmacist at [facility]. They advised because electronic rx did not have disclaimer at bottom stating we are allowed to send controlled electronically they would not accept the rx for dextromethorphan since it is a C2 med.  Please choose another medication or print paper rx for pt to pick up. Please contact pt once complete. Thanks!

## 2018-11-25 NOTE — Patient Instructions (Signed)
Upper Respiratory Infection, Adult An upper respiratory infection (URI) is a common viral infection of the nose, throat, and upper air passages that lead to the lungs. The most common type of URI is the common cold. URIs usually get better on their own, without medical treatment. What are the causes? A URI is caused by a virus. You may catch a virus by:  Breathing in droplets from an infected person's cough or sneeze.  Touching something that has been exposed to the virus (contaminated) and then touching your mouth, nose, or eyes. What increases the risk? You are more likely to get a URI if:  You are very young or very old.  It is autumn or winter.  You have close contact with others, such as at a daycare, school, or health care facility.  You smoke.  You have long-term (chronic) heart or lung disease.  You have a weakened disease-fighting (immune) system.  You have nasal allergies or asthma.  You are experiencing a lot of stress.  You work in an area that has poor air circulation.  You have poor nutrition. What are the signs or symptoms? A URI usually involves some of the following symptoms:  Runny or stuffy (congested) nose.  Sneezing.  Cough.  Sore throat.  Headache.  Fatigue.  Fever.  Loss of appetite.  Pain in your forehead, behind your eyes, and over your cheekbones (sinus pain).  Muscle aches.  Redness or irritation of the eyes.  Pressure in the ears or face. How is this diagnosed? This condition may be diagnosed based on your medical history and symptoms, and a physical exam. Your health care provider may use a cotton swab to take a mucus sample from your nose (nasal swab). This sample can be tested to determine what virus is causing the illness. How is this treated? URIs usually get better on their own within 7-10 days. You can take steps at home to relieve your symptoms. Medicines cannot cure URIs, but your health care provider may recommend  certain medicines to help relieve symptoms, such as:  Over-the-counter cold medicines.  Cough suppressants. Coughing is a type of defense against infection that helps to clear the respiratory system, so take these medicines only as recommended by your health care provider.  Fever-reducing medicines. Follow these instructions at home: Activity  Rest as needed.  If you have a fever, stay home from work or school until your fever is gone or until your health care provider says you are no longer contagious. Your health care provider may have you wear a face mask to prevent your infection from spreading. Relieving symptoms  Gargle with a salt-water mixture 3-4 times a day or as needed. To make a salt-water mixture, completely dissolve -1 tsp of salt in 1 cup of warm water.  Use a cool-mist humidifier to add moisture to the air. This can help you breathe more easily. Eating and drinking   Drink enough fluid to keep your urine pale yellow.  Eat soups and other clear broths. General instructions   Take over-the-counter and prescription medicines only as told by your health care provider. These include cold medicines, fever reducers, and cough suppressants.  Do not use any products that contain nicotine or tobacco, such as cigarettes and e-cigarettes. If you need help quitting, ask your health care provider.  Stay away from secondhand smoke.  Stay up to date on all immunizations, including the yearly (annual) flu vaccine.  Keep all follow-up visits as told by your health   care provider. This is important. How to prevent the spread of infection to others   URIs can be passed from person to person (are contagious). To prevent the infection from spreading: ? Wash your hands often with soap and water. If soap and water are not available, use hand sanitizer. ? Avoid touching your mouth, face, eyes, or nose. ? Cough or sneeze into a tissue or your sleeve or elbow instead of into your hand  or into the air. Contact a health care provider if:  You are getting worse instead of better.  You have a fever or chills.  Your mucus is brown or red.  You have yellow or brown discharge coming from your nose.  You have pain in your face, especially when you bend forward.  You have swollen neck glands.  You have pain while swallowing.  You have white areas in the back of your throat. Get help right away if:  You have shortness of breath that gets worse.  You have severe or persistent: ? Headache. ? Ear pain. ? Sinus pain. ? Chest pain.  You have chronic lung disease along with any of the following: ? Wheezing. ? Prolonged cough. ? Coughing up blood. ? A change in your usual mucus.  You have a stiff neck.  You have changes in your: ? Vision. ? Hearing. ? Thinking. ? Mood. Summary  An upper respiratory infection (URI) is a common infection of the nose, throat, and upper air passages that lead to the lungs.  A URI is caused by a virus.  URIs usually get better on their own within 7-10 days.  Medicines cannot cure URIs, but your health care provider may recommend certain medicines to help relieve symptoms. This information is not intended to replace advice given to you by your health care provider. Make sure you discuss any questions you have with your health care provider. Document Released: 03/07/2001 Document Revised: 04/27/2017 Document Reviewed: 04/27/2017 Elsevier Interactive Patient Education  2019 [employer]  Actinic Keratosis An actinic keratosis is a precancerous growth on the skin. If there is more than one growth, the condition is called actinic keratoses. Actinic keratoses appear most often on areas of skin that get a lot of sun exposure, including the scalp, face, ears, lips, upper back, forearms, and the backs of the hands. If left untreated, these growths may develop into a skin cancer called squamous cell carcinoma. It is important to have all  these growths checked by a health care provider to determine the best treatment approach. What are the causes? Actinic keratoses are caused by getting too much ultraviolet (UV) radiation from the sun or other UV light sources. What increases the risk? You are more likely to develop this condition if you:  Have light-colored skin and blue eyes.  Have blond or red hair.  Spend a lot of time in the sun.  Do not protect your skin from the sun when outdoors.  Are an older person. The risk of developing an actinic keratosis increases with age. What are the signs or symptoms? Actinic keratoses feel like scaly, rough spots of skin. Symptoms of this condition include growths that may:  Be as small as a pinhead or as big as a quarter.  Itch, hurt, or feel sensitive.  Be skin-colored, light tan, dark tan, pink, or a combination of any of these colors. In most cases, the growths become red.  Have a small piece of pink or gray skin (skin tag) growing from them.  It may be easier to notice actinic keratoses by feeling them, rather than seeing them. Sometimes, actinic keratoses disappear, but many reappear a few days to a few weeks later. How is this diagnosed? This condition is usually diagnosed with a physical exam.  A tissue sample may be removed from the actinic keratosis and examined under a microscope (biopsy). How is this treated? If needed, this condition may be treated by:  Scraping off the actinic keratosis (curettage).  Freezing the actinic keratosis with liquid nitrogen (cryosurgery). This causes the growth to eventually fall off the skin.  Applying medicated creams or gels to destroy the cells in the growth.  Applying chemicals to the actinic keratosis to make the outer layers of skin peel off (chemical peel).  Using photodynamic therapy. In this procedure, medicated cream is applied to the actinic keratosis. This cream increases your skin's sensitivity to light. Then, a strong  light is aimed at the actinic keratosis to destroy cells in the growth. Follow these instructions at home: Skin care  Apply cool, wet cloths (cool compresses) to the affected areas.  Do not scratch your skin.  Check your skin regularly for any growths, especially growths that: ? Start to itch or bleed. ? Change in size, shape, or color. Caring for the treated area  Keep the treated area clean and dry as told by your health care provider.  Do not apply any medicine, cream, or lotion to the treated area unless your health care provider tells you to do that.  Do not pick at blisters or try to break them open. This can cause infection and scarring.  If you have red or irritated skin after treatment, follow instructions from your health care provider about how to take care of the treated area. Make sure you: ? Wash your hands with soap and water before you change your bandage (dressing). If soap and water are not available, use hand sanitizer. ? Change your dressing as told by your health care provider.  If you have red or irritated skin after treatment, check your treated area every day for signs of infection. Check for: ? Redness, swelling, or pain. ? Fluid or blood. ? Warmth. ? Pus or a bad smell. General instructions  Take or apply over-the-counter and prescription medicines only as told by your health care provider.  Return to your normal activities as told by your health care provider. Ask your health care provider what activities are safe for you.  Have a skin exam done every year by a health care provider who is a skin specialist (dermatologist).  Keep all follow-up visits as told by your health care provider. This is important. Lifestyle  Do not use any products that contain nicotine or tobacco, such as cigarettes and e-cigarettes. If you need help quitting, ask your health care provider.  Take steps to protect your skin from the sun. ? Try to avoid the sun between 10:00  a.m. and 4:00 p.m. This is when the UV light is the strongest. ? Use a sunscreen or sunblock with SPF 30 (sun protection factor 30) or greater. ? Apply sunscreen before you are exposed to sunlight and reapply as often as directed by the instructions on the sunscreen container. ? Always wear sunglasses that have UV protection, and always wear a hat and clothing to protect your skin from sunlight. ? When possible, avoid medicines that increase your sensitivity to sunlight. ? Do not use tanning beds or other indoor tanning devices. Contact a health care  provider if:  You notice any changes or new growths on your skin.  You have swelling, pain, or more redness around your treated area.  You have fluid or blood coming from your treated area.  Your treated area feels warm to the touch.  You have pus or a bad smell coming from your treated area.  You have a fever.  You have a blister that becomes large and painful. Summary  An actinic keratosis is a precancerous growth on the skin. If there is more than one growth, the condition is called actinic keratoses. In some cases, if left untreated, these growths can develop into skin cancer.  Check your skin regularly for any growths, especially growths that start to itch or bleed, or change in size, shape, or color.  Take steps to protect your skin from the sun.  Contact a health care provider if you notice any changes or new growths on your skin.  Keep all follow-up visits as told by your health care provider. This is important. This information is not intended to replace advice given to you by your health care provider. Make sure you discuss any questions you have with your health care provider. Document Released: 12/08/2008 Document Revised: 01/22/2018 Document Reviewed: 01/22/2018 Elsevier Interactive Patient Education  2019 [employer].

## 2018-11-25 NOTE — Telephone Encounter (Signed)
IT ticket placed to double check information being sent over in rx's. Per Crystal in IT she spoke with [facility] and they advised the rx they received was for Dextromethorphan-Amphetamine and that's why it was coming up as C2 and without disclaimer they could not fill, not sure where this rx came from. She advised pharmacy this is not what was ordered and sent over. They confirmed they did have rx for Dextromethorphan only, they will fill this and contact pt to let him know.   Spoke with pt and advised. He is going to get his rx's today. Nothing further needed.

## 2018-11-26 ENCOUNTER — Encounter: Payer: Self-pay | Admitting: Family Medicine

## 2018-11-26 ENCOUNTER — Ambulatory Visit (INDEPENDENT_AMBULATORY_CARE_PROVIDER_SITE_OTHER): Payer: Medicare HMO | Admitting: Family Medicine

## 2018-11-26 ENCOUNTER — Encounter (INDEPENDENT_AMBULATORY_CARE_PROVIDER_SITE_OTHER): Payer: Self-pay | Admitting: Family Medicine

## 2018-11-26 VITALS — BP 134/80 | HR 94 | Ht 69.0 in | Wt 230.0 lb

## 2018-11-26 DIAGNOSIS — L570 Actinic keratosis: Secondary | ICD-10-CM | POA: Diagnosis not present

## 2018-11-26 DIAGNOSIS — E669 Obesity, unspecified: Secondary | ICD-10-CM

## 2018-11-26 DIAGNOSIS — Z6834 Body mass index (BMI) 34.0-34.9, adult: Secondary | ICD-10-CM | POA: Diagnosis not present

## 2018-11-26 DIAGNOSIS — R632 Polyphagia: Secondary | ICD-10-CM | POA: Diagnosis not present

## 2018-11-26 NOTE — Progress Notes (Signed)
Office: [phone]  /  Fax: [phone]   HPI:   Chief Complaint: OBESITY [name] [name] is here to discuss his progress with his obesity treatment plan. He is on the keep a food journal with 2500-2800 calories and 150+ grams of protein daily and is following his eating plan approximately 25 % of the time. He states he is exercising 0 minutes 0 times per week. [name] [name]'s last visit was 6 months ago, and he has not been working on weight loss. He has gained back everything he lost with [name]. He states he is ready to get back on track. He has a history of binge eating ice cream.  His weight is 230 lb (104.3 kg) today and has gained 18 pounds since his last visit. He has lost 0 lbs since starting treatment with [name].  Binge Eating [name] [name] notes episodes of binge eating almost daily. He has a history of eating 1/2 gallon of ice cream at a time. He has a perfectionist personality and is often swinging from very resistive eating to overindulging.  ASSESSMENT AND PLAN:  Binge eating  Class 1 obesity with serious comorbidity and body mass index (BMI) of 34.0 to 34.9 in adult, unspecified obesity type  PLAN:  Binge Eating We will refer to Dr. Mallie Mussel, our bariatric psychologist to evaluate. [name] [name] agrees to follow up with our clinic in 2 weeks with Dr. Mallie Mussel and in 4 weeks with myself.  I spent > than 50% of the 25 minute visit on counseling as documented in the note.  Obesity [name] [name] is currently in the action stage of change. As such, his goal is to get back to weightloss efforts  He has agreed to keep a food journal with 2200-2700 calories and 150 grams of protein daily with sugar below 100 grams. [name] [name] has been instructed to work up to a goal of 150 minutes of combined cardio and strengthening exercise per week for weight loss and overall health benefits. We discussed the following Behavioral Modification Strategies today: increasing lean protein intake, decreasing simple carbohydrates  and work on meal  planning and easy cooking plans   [name] [name] has agreed to follow up with our clinic in 2 weeks with Dr. Mallie Mussel and in 4 weeks with myself. He was informed of the importance of frequent follow up visits to maximize his success with intensive lifestyle modifications for his multiple health conditions.  ALLERGIES: Allergies  Allergen Reactions  . Shrimp [Shellfish Allergy] Anaphylaxis and Rash  . Codeine Rash and Other (See Comments)    Blacked out  . Contrast Media [Iodinated Diagnostic Agents] Rash  . Diazepam Rash    Rash over whole body from neck down, and he lost his voice (airway compromise?)  . Amoxicillin Other (See Comments)    Unknown   . Benadryl [Diphenhydramine]     With dyes   . Depakote [Divalproex Sodium] Swelling    Per pts report (given by Dr. Inda Merlin).  Has throat swelling and rash.  . Erythromycin Other (See Comments)    unknown  . Flexeril [Cyclobenzaprine]   . Betadine [Povidone Iodine] Rash  . Celebrex [Celecoxib] Rash  . Meperidine And Related Rash  . Methocarbamol Rash  . Neosporin [Neomycin-Bacitracin Zn-Polymyx] Rash  . Oxycodone Rash  . Penicillins Rash  . Plavix [Clopidogrel] Rash  . Rivaroxaban Rash  . Vioxx [Rofecoxib] Rash    MEDICATIONS: Current Outpatient Medications on File Prior to Visit  Medication Sig Dispense Refill  . allopurinol (ZYLOPRIM) 100 MG tablet TAKE 1 TABLET EVERY  DAY 90 tablet 3  . amitriptyline (ELAVIL) 100 MG tablet Take 1 tablet (100 mg total) by mouth at bedtime. 90 tablet 4  . aspirin 81 MG tablet Take 81 mg by mouth daily.    . benzonatate (TESSALON) 100 MG capsule Take 1 capsule (100 mg total) by mouth 2 (two) times daily as needed for cough. 20 capsule 0  . Cholecalciferol (VITAMIN D PO) Take 1 tablet by mouth daily.    . Coenzyme Q10 (CO Q 10 PO) Take by mouth daily.    . Cyanocobalamin (VITAMIN B 12 PO) Take by mouth.    . dextromethorphan 15 MG/5ML syrup Take 10 mLs (30 mg total) by mouth 3 (three) times daily as  needed for cough. 120 mL 0  . divalproex (DEPAKOTE) 250 MG DR tablet Take 1 tablet (250 mg total) by mouth 2 (two) times daily. 180 tablet 6  . DULoxetine (CYMBALTA) 60 MG capsule Take 1 capsule (60 mg total) by mouth daily. 90 capsule 4  . EPINEPHRINE 0.3 mg/0.3 mL IJ SOAJ injection INJECT 0.3 MLS INTO THE MUSCLE ONCE 2 mL 1  . Erenumab-aooe (AIMOVIG) 70 MG/ML SOAJ Inject 70 mg into the skin every 30 (thirty) days. 2 pen 0  . Erenumab-aooe (AIMOVIG) 70 MG/ML SOAJ Inject 70 mg into the skin every 30 (thirty) days. 3 pen 0  . gabapentin (NEURONTIN) 300 MG capsule Take 1 capsule (300 mg total) by mouth 2 (two) times daily. 180 capsule 4  . Gabapentin, Once-Daily, (GRALISE) 300 MG TABS Take 300 mg by mouth 2 (two) times daily. 60 tablet 11  . Multiple Vitamin (MULTIVITAMIN) tablet Take 1 tablet by mouth daily.    . naproxen (NAPROSYN) 500 MG tablet Take 500 mg by mouth 2 (two) times daily with a meal.    . Omega-3 Fatty Acids (FISH OIL) 1000 MG CAPS Take 1 capsule by mouth daily.    . pravastatin (PRAVACHOL) 40 MG tablet TAKE 1 TABLET EVERY DAY 90 tablet 3  . Psyllium (METAMUCIL PO) Take 2 tablets by mouth 2 (two) times daily.    . Red Yeast Rice Extract (RED YEAST RICE PO) Take 1 tablet by mouth daily.    . rizatriptan (MAXALT) 10 MG tablet Take 1 tablet (10 mg total) by mouth as needed for migraine. May repeat in 2 hours if needed 30 tablet 4  . SUMAtriptan (IMITREX) 100 MG tablet Take 1 tablet by mouth once as needed for migraine. May repeat in 2 hours if needed. Max 2 per 24 hours. 27 tablet 0  . tiZANidine (ZANAFLEX) 4 MG tablet Take 1 tablet (4 mg total) by mouth 3 (three) times daily as needed for muscle spasms. 270 tablet 4  . TURMERIC CURCUMIN PO Take 1,050 mg by mouth daily.    . valACYclovir (VALTREX) 500 MG tablet Take 500 mg by mouth daily.    . vitamin C (ASCORBIC ACID) 500 MG tablet Take 500 mg by mouth daily.    . vitamin E 1000 UNIT capsule Take 1,000 Units by mouth daily.      Current Facility-Administered Medications on File Prior to Visit  Medication Dose Route Frequency Provider Last Rate Last Dose  . 0.9 %  sodium chloride infusion  500 mL Intravenous Once Ladene Artist, MD        PAST MEDICAL HISTORY: Past Medical History:  Diagnosis Date  . ALLERGIC RHINITIS 04/22/2007  . Allergy   . Cancer (HCC)    HX SKIN CANCER  . Complication of  anesthesia    more concerned about positioning of head & neck because of TMJ  . DDD (degenerative disc disease), cervical   . GERD (gastroesophageal reflux disease)    infrequently  . Headache(784.0)    migraines  . History of kidney stones   . History of skin cancer   . History of TMJ disorder   . HYPERCHOLESTEROLEMIA 04/22/2007  . HYPERLIPIDEMIA 07/29/2007  . Hyperplastic colon polyp   . NEPHROLITHIASIS, HX OF 07/29/2007  . Osteoarth NOS-Unspec 04/22/2007  . Shortness of breath   . Shoulder pain, acute    bilateral    PAST SURGICAL HISTORY: Past Surgical History:  Procedure Laterality Date  . APPENDECTOMY    . BACK SURGERY  10/07/2015  . COLONOSCOPY     10 yrs ago- 2 HPP   . COLONOSCOPY  2019  . JOINT REPLACEMENT    . KNEE ARTHROSCOPY     right x2  . SHOULDER SURGERY     right  . skin cancer biopsy     L forehead  . TOTAL KNEE ARTHROPLASTY Right 08/25/2013   Procedure: RIGHT TOTAL KNEE ARTHROPLASTY;  Surgeon: Gearlean Alf, MD;  Location: [facility];  Service: Orthopedics;  Laterality: Right;  . TOTAL SHOULDER ARTHROPLASTY Left 12/25/2013   DR SUPPLE   . TOTAL SHOULDER ARTHROPLASTY Left 12/25/2013   Procedure: LEFT TOTAL SHOULDER ARTHROPLASTY;  Surgeon: Marin Shutter, MD;  Location: [facility];  Service: Orthopedics;  Laterality: Left;  . TOTAL SHOULDER ARTHROPLASTY Right 06/18/2014   DR SUPPLE  . TOTAL SHOULDER ARTHROPLASTY Right 06/18/2014   Procedure: RIGHT TOTAL SHOULDER ARTHROPLASTY;  Surgeon: Marin Shutter, MD;  Location: [facility];  Service: Orthopedics;  Laterality: Right;  . WISDOM TOOTH EXTRACTION       SOCIAL HISTORY: Social History   Tobacco Use  . Smoking status: Never Smoker  . Smokeless tobacco: Never Used  Substance Use Topics  . Alcohol use: No    Alcohol/week: 0.0 standard drinks  . Drug use: No    FAMILY HISTORY: Family History  Problem Relation Age of Onset  . COPD Father        family hx  . Emphysema Father   . Aneurysm Mother   . Stroke Mother   . Sudden death Mother   . Cancer Mother   . Obesity Mother   . Breast cancer Mother   . Cancer Maternal Grandfather        lung  . Colon cancer Neg Hx   . Colon polyps Neg Hx     ROS: Review of Systems  Constitutional: Negative for weight loss.    PHYSICAL EXAM: Blood pressure 134/80, pulse 94, height 5\' 9"  (1.753 m), weight 230 lb (104.3 kg), SpO2 95 %. Body mass index is 33.97 kg/m. Physical Exam Vitals signs reviewed.  Constitutional:      Appearance: Normal appearance. He is obese.  Cardiovascular:     Rate and Rhythm: Normal rate.     Pulses: Normal pulses.  Pulmonary:     Effort: Pulmonary effort is normal.     Breath sounds: Normal breath sounds.  Musculoskeletal: Normal range of motion.  Skin:    General: Skin is warm and dry.  Neurological:     Mental Status: He is alert and oriented to person, place, and time.  Psychiatric:        Mood and Affect: Mood normal.        Behavior: Behavior normal.     RECENT LABS AND TESTS: BMET  Component Value Date/Time   NA 140 11/21/2017 0819   K 4.4 11/21/2017 0819   CL 103 11/21/2017 0819   CO2 21 11/21/2017 0819   GLUCOSE 93 11/21/2017 0819   GLUCOSE 94 07/31/2014 0920   GLUCOSE 94 08/20/2006 0830   BUN 25 11/21/2017 0819   CREATININE 1.14 11/21/2017 0819   CALCIUM 9.3 11/21/2017 0819   GFRNONAA 67 11/21/2017 0819   GFRAA 77 11/21/2017 0819   Lab Results  Component Value Date   HGBA1C 5.5 11/21/2017   HGBA1C 5.8 (H) 06/18/2017   Lab Results  Component Value Date   INSULIN 8.6 11/21/2017   INSULIN 16.5 06/18/2017   CBC     Component Value Date/Time   WBC 5.5 06/18/2017 1047   WBC 5.7 07/31/2014 0920   RBC 4.60 06/18/2017 1047   RBC 4.53 07/31/2014 0920   HGB 15.7 06/18/2017 1047   HCT 45.0 06/18/2017 1047   PLT 313.0 07/31/2014 0920   MCV 98 (H) 06/18/2017 1047   MCH 34.1 (H) 06/18/2017 1047   MCH 31.8 06/10/2014 0955   MCHC 34.9 06/18/2017 1047   MCHC 33.4 07/31/2014 0920   RDW 14.3 06/18/2017 1047   LYMPHSABS 1.8 06/18/2017 1047   MONOABS 0.5 07/31/2014 0920   EOSABS 0.3 06/18/2017 1047   BASOSABS 0.1 06/18/2017 1047   Iron/TIBC/Ferritin/ %Sat No results found for: IRON, TIBC, FERRITIN, IRONPCTSAT Lipid Panel     Component Value Date/Time   CHOL 239 (H) 11/21/2017 0819   TRIG 139 11/21/2017 0819   TRIG 52 08/20/2006 0830   HDL 62 11/21/2017 0819   CHOLHDL 4 07/31/2014 0920   VLDL 24.4 07/31/2014 0920   LDLCALC 149 (H) 11/21/2017 0819   LDLDIRECT 135.0 07/16/2013 0937   Hepatic Function Panel     Component Value Date/Time   PROT 7.0 11/21/2017 0819   ALBUMIN 4.3 11/21/2017 0819   AST 40 11/21/2017 0819   ALT 40 11/21/2017 0819   ALKPHOS 46 11/21/2017 0819   BILITOT 0.5 11/21/2017 0819   BILIDIR 0.1 07/31/2014 0920      Component Value Date/Time   TSH 2.920 06/18/2017 1047   TSH 1.53 07/31/2014 0920   TSH 1.85 07/16/2013 0937      OBESITY BEHAVIORAL INTERVENTION VISIT  Today's visit was # 14   Starting weight: 230 lbs Starting date: 06/18/17 Today's weight : 230 lbs Today's date: 11/26/2018 Total lbs lost to date: 0    11/26/2018  Height 5\' 9"  (1.753 m)  Weight 230 lb (104.3 kg)  BMI (Calculated) 33.95  BLOOD PRESSURE - SYSTOLIC 956  BLOOD PRESSURE - DIASTOLIC 80   Body Fat % 38.7 %  Total Body Water (lbs) 106.8 lbs     ASK: We discussed the diagnosis of obesity with [name] [name] today and [name] [name] agreed to give [name] permission to discuss obesity behavioral modification therapy today.  ASSESS: [name] [name] has the diagnosis of obesity and his BMI today is 33.95 [name] [name]  is in the action stage of change   ADVISE: [name] [name] was educated on the multiple health risks of obesity as well as the benefit of weight loss to improve his health. He was advised of the need for long term treatment and the importance of lifestyle modifications to improve his current health and to decrease his risk of future health problems.  AGREE: Multiple dietary modification options and treatment options were discussed and  [name] [name] agreed to follow the recommendations documented in the above note.  ARRANGE: [name] [name] was educated on the  importance of frequent visits to treat obesity as outlined per CMS and USPSTF guidelines and agreed to schedule his next follow up appointment today.  I, Trixie Dredge, am acting as transcriptionist for Dennard Nip, MD  I have reviewed the above documentation for accuracy and completeness, and I agree with the above. -Dennard Nip, MD

## 2018-11-27 DIAGNOSIS — M79644 Pain in right finger(s): Secondary | ICD-10-CM | POA: Diagnosis not present

## 2018-12-02 ENCOUNTER — Encounter: Payer: Self-pay | Admitting: Neurology

## 2018-12-02 ENCOUNTER — Ambulatory Visit: Payer: Medicare HMO | Admitting: Neurology

## 2018-12-02 VITALS — BP 131/86 | HR 90 | Ht 69.0 in | Wt 230.0 lb

## 2018-12-02 DIAGNOSIS — H9313 Tinnitus, bilateral: Secondary | ICD-10-CM

## 2018-12-02 DIAGNOSIS — G4733 Obstructive sleep apnea (adult) (pediatric): Secondary | ICD-10-CM

## 2018-12-02 DIAGNOSIS — G43711 Chronic migraine without aura, intractable, with status migrainosus: Secondary | ICD-10-CM

## 2018-12-02 MED ORDER — ERENUMAB-AOOE 140 MG/ML ~~LOC~~ SOAJ: 140.0000 mg | SUBCUTANEOUS | 0 refills | Status: DC

## 2018-12-02 NOTE — Patient Instructions (Signed)
Continue current medications   Migraine Headache A migraine headache is an intense, throbbing pain on one side or both sides of the head. Migraines may also cause other symptoms, such as nausea, vomiting, and sensitivity to light and noise. What are the causes? Doing or taking certain things may also trigger migraines, such as:  Alcohol.  Smoking.  Medicines, such as: ? Medicine used to treat chest pain (nitroglycerine). ? Birth control pills. ? Estrogen pills. ? Certain blood pressure medicines.  Aged cheeses, chocolate, or caffeine.  Foods or drinks that contain nitrates, glutamate, aspartame, or tyramine.  Physical activity. Other things that may trigger a migraine include:  Menstruation.  Pregnancy.  Hunger.  Stress, lack of sleep, too much sleep, or fatigue.  Weather changes. What increases the risk? The following factors may make you more likely to experience migraine headaches:  Age. Risk increases with age.  Family history of migraine headaches.  Being Caucasian.  Depression and anxiety.  Obesity.  Being a woman.  Having a hole in the heart (patent foramen ovale) or other heart problems. What are the signs or symptoms? The main symptom of this condition is pulsating or throbbing pain. Pain may:  Happen in any area of the head, such as on one side or both sides.  Interfere with daily activities.  Get worse with physical activity.  Get worse with exposure to bright lights or loud noises. Other symptoms may include:  Nausea.  Vomiting.  Dizziness.  General sensitivity to bright lights, loud noises, or smells. Before you get a migraine, you may get warning signs that a migraine is developing (aura). An aura may include:  Seeing flashing lights or having blind spots.  Seeing bright spots, halos, or zigzag lines.  Having tunnel vision or blurred vision.  Having numbness or a tingling feeling.  Having trouble talking.  Having muscle  weakness. How is this diagnosed? A migraine headache can be diagnosed based on:  Your symptoms.  A physical exam.  Tests, such as CT scan or MRI of the head. These imaging tests can help rule out other causes of headaches.  Taking fluid from the spine (lumbar puncture) and analyzing it (cerebrospinal fluid analysis, or CSF analysis). How is this treated? A migraine headache is usually treated with medicines that:  Relieve pain.  Relieve nausea.  Prevent migraines from coming back. Treatment may also include:  Acupuncture.  Lifestyle changes like avoiding foods that trigger migraines. Follow these instructions at home: Medicines  Take over-the-counter and prescription medicines only as told by your health care provider.  Do not drive or use heavy machinery while taking prescription pain medicine.  To prevent or treat constipation while you are taking prescription pain medicine, your health care provider may recommend that you: ? Drink enough fluid to keep your urine clear or pale yellow. ? Take over-the-counter or prescription medicines. ? Eat foods that are high in fiber, such as fresh fruits and vegetables, whole grains, and beans. ? Limit foods that are high in fat and processed sugars, such as fried and sweet foods. Lifestyle  Avoid alcohol use.  Do not use any products that contain nicotine or tobacco, such as cigarettes and e-cigarettes. If you need help quitting, ask your health care provider.  Get at least 8 hours of sleep every night.  Limit your stress. General instructions      Keep a journal to find out what may trigger your migraine headaches. For example, write down: ? What you eat and drink. ?  How much sleep you get. ? Any change to your diet or medicines.  If you have a migraine: ? Avoid things that make your symptoms worse, such as bright lights. ? It may help to lie down in a dark, quiet room. ? Do not drive or use heavy machinery. ? Ask  your health care provider what activities are safe for you while you are experiencing symptoms.  Keep all follow-up visits as told by your health care provider. This is important. Contact a health care provider if:  You develop symptoms that are different or more severe than your usual migraine symptoms. Get help right away if:  Your migraine becomes severe.  You have a fever.  You have a stiff neck.  You have vision loss.  Your muscles feel weak or like you cannot control them.  You start to lose your balance often.  You develop trouble walking.  You faint. This information is not intended to replace advice given to you by your health care provider. Make sure you discuss any questions you have with your health care provider. Document Released: 09/11/2005 Document Revised: 03/31/2016 Document Reviewed: 02/28/2016 Elsevier Interactive Patient Education  2019 [employer]  Sleep Apnea Sleep apnea is a condition in which breathing pauses or becomes shallow during sleep. Episodes of sleep apnea usually last 10 seconds or longer, and they may occur as many as 20 times an hour. Sleep apnea disrupts your sleep and keeps your body from getting the rest that it needs. This condition can increase your risk of certain health problems, including:  Heart attack.  Stroke.  Obesity.  Diabetes.  Heart failure.  Irregular heartbeat. There are three kinds of sleep apnea:  Obstructive sleep apnea. This kind is caused by a blocked or collapsed airway.  Central sleep apnea. This kind happens when the part of the brain that controls breathing does not send the correct signals to the muscles that control breathing.  Mixed sleep apnea. This is a combination of obstructive and central sleep apnea. What are the causes? The most common cause of this condition is a collapsed or blocked airway. An airway can collapse or become blocked if:  Your throat muscles are abnormally relaxed.  Your  tongue and tonsils are larger than normal.  You are overweight.  Your airway is smaller than normal. What increases the risk? This condition is more likely to develop in people who:  Are overweight.  Smoke.  Have a smaller than normal airway.  Are elderly.  Are male.  Drink alcohol.  Take sedatives or tranquilizers.  Have a family history of sleep apnea. What are the signs or symptoms? Symptoms of this condition include:  Trouble staying asleep.  Daytime sleepiness and tiredness.  Irritability.  Loud snoring.  Morning headaches.  Trouble concentrating.  Forgetfulness.  Decreased interest in sex.  Unexplained sleepiness.  Mood swings.  Personality changes.  Feelings of depression.  Waking up often during the night to urinate.  Dry mouth.  Sore throat. How is this diagnosed? This condition may be diagnosed with:  A medical history.  A physical exam.  A series of tests that are done while you are sleeping (sleep study). These tests are usually done in a sleep lab, but they may also be done at home. How is this treated? Treatment for this condition aims to restore normal breathing and to ease symptoms during sleep. It may involve managing health issues that can affect breathing, such as high blood pressure or obesity. Treatment  may include:  Sleeping on your side.  Using a decongestant if you have nasal congestion.  Avoiding the use of depressants, including alcohol, sedatives, and narcotics.  Losing weight if you are overweight.  Making changes to your diet.  Quitting smoking.  Using a device to open your airway while you sleep, such as: ? An oral appliance. This is a custom-made mouthpiece that shifts your lower jaw forward. ? A continuous positive airway pressure (CPAP) device. This device delivers oxygen to your airway through a mask. ? A nasal expiratory positive airway pressure (EPAP) device. This device has valves that you put into  each nostril. ? A bi-level positive airway pressure (BPAP) device. This device delivers oxygen to your airway through a mask.  Surgery if other treatments do not work. During surgery, excess tissue is removed to create a wider airway. It is important to get treatment for sleep apnea. Without treatment, this condition can lead to:  High blood pressure.  Coronary artery disease.  (Men) An inability to achieve or maintain an erection (impotence).  Reduced thinking abilities. Follow these instructions at home:  Make any lifestyle changes that your health care provider recommends.  Eat a healthy, well-balanced diet.  Take over-the-counter and prescription medicines only as told by your health care provider.  Avoid using depressants, including alcohol, sedatives, and narcotics.  Take steps to lose weight if you are overweight.  If you were given a device to open your airway while you sleep, use it only as told by your health care provider.  Do not use any tobacco products, such as cigarettes, chewing tobacco, and e-cigarettes. If you need help quitting, ask your health care provider.  Keep all follow-up visits as told by your health care provider. This is important. Contact a health care provider if:  The device that you received to open your airway during sleep is uncomfortable or does not seem to be working.  Your symptoms do not improve.  Your symptoms get worse. Get help right away if:  You develop chest pain.  You develop shortness of breath.  You develop discomfort in your back, arms, or stomach.  You have trouble speaking.  You have weakness on one side of your body.  You have drooping in your face. These symptoms may represent a serious problem that is an emergency. Do not wait to see if the symptoms will go away. Get medical help right away. Call your local emergency services (911 in the [place] Do not drive yourself to the hospital. This information is not  intended to replace advice given to you by your health care provider. Make sure you discuss any questions you have with your health care provider. Document Released: 09/01/2002 Document Revised: 04/09/2017 Document Reviewed: 06/21/2015 Elsevier Interactive Patient Education  2019 [employer].

## 2018-12-02 NOTE — Progress Notes (Signed)
[facility]    Provider:  Dr Jaynee Eagles Referring Provider: [name], MD Primary Care Physician:  [name], MD  CC:  Chronic Migraines  Interval history 12/02/2018: He is now using the cpap. He was snoring and very tired. He is still on Tizanidine, cymbalta, gabapentin and amitriptyline. He is improved on cpap, sleeping better and less migraines. He is still on the [place]. He is doing better. Been using the cpap for 2 weeks. He is still having back pain which can cause the migraine sin the afternoon He went to the [facility], they didn't know why he has ringing in the ears. He stopped Naproxyn to see if maybe that was causing his symptoms. Gained 30 pounds - everything he lost he gained back. He went to Dr Leafy Ro and is trying to lose weight   Interval history 06/24/2018: he tried Topiramate. He had a terrible July and associated with his back pain. He had a great August because his back felt better. Only 2 minor migraines in August. We tried Topiramate and could not tolerate, tried coming off Amitriptyline and had worse headaches. He takes capsules Gabapentin generic twice daily.  He sees no difference between aimovig 140mg  qmonthly vs 70mg .   Interval history 03/20/2018: Here for follow up of migraines on Aimovig, Gralise, elavil, cymbalta, triptans, tizanidine. He was doing exceptionally well but he had a colonoscopy that worsened his back pain  And also worsened his migraines. He declines botox for migraine, have discussed with him a few times. He does not want botox. He is having difficulty with weight loss despite physical activity and a good diet. Advised TCAs can cause this as can gabapentin. Will try to decrease his amitriptyline. Then start Topiramate ER and decrease neurontin.  Reviewed headache diary: Total headache days a month:  January 2 February 16 March 17 Apil 5 May 5 June 9  Tried: Amitriptyline, Topamax, Cymbalta, Rizatriptan,  Aimovig, Melatonin, Coffe before bed (possibly hypnic headaches), Depakote (swelling allergy), Gralise, Tizanidine, Naproxen, flexeril, methocarbamol,   Interval history 11/20/2017: Since starting Gralise and aimovig he is doing extremely well. In January had 2 migraines, in February had 4 days of migraines but otherwise none. The migraines are not as long and not as intense. Reduced intensity. We were having a lot of difficulty getting Aimovig approved, he spent hours on the phone. Gave him samples today. He is doing spectacularly on Aimovig. He takes Tizanidine and Amitriptyline at bedtime. He takes Gralise with food. He [facility], he lost 20 pounds. He has been walking, walking everyday. Continue one injection.   Interval history 07/23/2017:  MAZE CORNIEL is a 68 y.o. male here as a referral from Dr. Volanda [name] for Migraines. They are improved on Amitriptyline, Gralise and Aimovig. The intensity and duration are improved as well as the frequency. Also helping him sleep. However making him more tired during the early part of the day. He has a "lot of pain" in other places around his body, pain in his right thumb and index finger, he has had multiple orthopaedic operations. They moved into a new house. He has back pain, knee pain and thumb pain. He carried a lot of boxes during the move which exacerbated his symptoms. He feels like his weight is not improving, discussed these medications have weight gain.  He saw [name] a month ago, Reviewed his migraine diary in detail, talked in length about his migraines which  have decreased in frequency by 48% and 36.7% in frequency however his migraine severity has reduced by 50% as did it.   Interval history 04/26/2017: patient is following for nocturnal and morning headaches. Has been diagnosed with sleep apnea but refuses CPAP machine and despite discussions that this could be the cause of his headaches. These tried  multiple medications as below, we've tried treatments for migraines as well as possibly hypnic headaches. We've tried the new CGRP monoclonal antibody Aimovig. He has had a migraine every day for the last 2 weeks. Happening at night about 1/2 the time and now more during the day associated with back pain. He is taking melatonin, amitriptyline, cymbalta and flexeril at night. Recommend seeing Dr. Rexene Alberts to discuss sleep study in detail which showed sleep apnea.   Interval history 01/04/2017:Patient following up for morning headaches. MRI brain was normal. Patient has OSA diagnosed here at [facility] but he declined to be treated despite headaches waking him up at night. I have highly encouraged him to use a cpap as this may resolve his nocturnal and morning headaches and also warned of the sequelae of untreated sleep apnea such as increased risk of stroke. He takes tylenol PM every night, cymbalta, He is on Amitriptyline 75mg  at night. He has been to PT for his musculoskeletal neck pain.   Tried: Amitriptyline, Topamax, Cymbalta, Rizatriptan, Aimovig, Melatonin, Coffe before bed (possibly hypnic headaches), Depakote (swelling allergy), Gralise, Tizanidine  Has not tried: Depakote, propranolol, Indomethacin,   DVV:OHYWV [name] a 68 y.o.malehere as a referral from Dr. Raynelle Chary migraines. PMHx migraines. He has had migraines/headachesfor 10 years. He travels a lot and has significant osteoarthritis. He has had shoulder replacements and back fusion of l4-l5. His skeletal issues are a migraine trigger. All his migraines are in the middle of the night, he wakes up with headaches 95% of the time. He has seen a lot of doctors. Sumatriptan helps with the migraines. He has been to many doctors. He saw Dr. Melton Alar for headaches. He has daily headaches. He was taking Sumatriptan daily but now only 9 times a month and no medication overuse. Headaches start in the neck, feels like he is going to explode, all  over the head, so severe he can't even look at things, painful pressure all over the head, in his eyes behind his eyes, stiffness and tense, his head is pounding, nausea, extreme light and sound sensitivity. He snores, he doesn't wake up from snoring. No aura. He will also get migraines during the day if he is not conscious of his posture. Migraines last 8 hours. He is terrified to go to bed at night because he wakes up with headaches. No vision changes, speech changes, new weakness or sensory changes. No other focal neurologic deficits, associated symptoms, inciting events or modifiable factors.  Tried: Topiramate, Elavil, acetaminophen, imitrex   Reviewed notes, labs and imaging from outside physicians, which showed:  Reviewed primary care notes and notes from [facility] spine center. Patient has a history of migraines. Migraines have been refractory. These responded nicely to sumatriptan but his insurance company has switched to an alternate triptan and he is quite limited in his monthly supply. He is under considerable stress recently. He tried Topamax which she did not tolerate well and recently discontinued. He is previous s/p posterior lumbar interbody fusion at L4-L5 performed on 10-07-15 with noted degenerative changes at L3-L4 with collapse to the right and noted degenerative scoliosis. He has right low back pain and right  hip pain that is constant and it hurts in any position. He denies any radicular pain or numbness or weakness. He has a progressive collapse of the disc space at L3-4 that is eccentric and now accentuating the lateral listhesis at L2-3. The MR shows obvious endplate inflammation as well so they will start with ESI efforts and see if this helps.If not, he will need an extension of his fusion with correciton of the deformity up to L2.Marland Kitchen  MRI LUMBAR SPINE WITHOUT CONTRAST, 08/16/2016 10:51 AM   INDICATION: LOW BACK PAIN, >6 WKS / RED FLAG(S) / RADICULOPATHY \ \ Z98.1 S/P  lumbar fusion \ M54.5 Spine pain, lumbar   COMPARISON: Lumbar spine radiograph 07/18/2016 and CT myelogram 07/12/2015   TECHNIQUE: Multiplanar, multi-sequence surface-coil MR imaging of the lumbar spine was performed without contrast.   LEVELS IMAGED: Lower thoracic to upper sacral region.   FINDINGS:  Alignment: Similar grade 1 anterolisthesis of L4 and L5. Trace retrolisthesis of L2 on L3, similar to prior.Similar levoscoliotic curvature of the spine. Vertebrae: There are fatty endplate degenerative changes at L4-L5. Increased T2/STIR signal within the endplates at Q3-F3 and the inferior endplate of L2, compatible with edema/inflammation, likely degenerative in etiology. Redemonstrated postsurgical changes of posterior lumbar interbody fusion at L4-L5 with pedicle screw and rod internal fixation. Paraspinal soft tissues: Increased T2/STIR signal within the paraspinal musculature adjacent to the operative site, which is favored to reflect postsurgical changes. Conus: Normal in signal and position, terminating at the superior aspect of L2  T12-L1: No significant focal abnormality. L1-L2: No significant focal abnormality. L2-L3: Similar trace retrolisthesis of L2 on L3 with slight uncovering of the disc. There is a left eccentric disc bulge with disc desiccation and height loss. Facet hypertrophy with ligamentum flavum thickening and facet effusions bilaterally. There is mild bilateral foraminal narrowing L3-L4: There is marked narrowing of the right aspect of the disc space at L3-4 with surrounding high STIR, low T1 signal compatible with edema, likely related to progressive degenerative changes at this level. Broad-based disc bulge with facet hypertrophy and ligamentum flavum thickening. There is moderate right lateral recess narrowing. There is moderate right foraminal narrowing and mild left foraminal narrowing. L4-L5: Grade one anterolisthesis of L4 and L5, with slight uncovering  of the disc. There is facet hypertrophy and ligamentum flavum thickening without canal narrowing. There is soft tissue extending into the foramen on the right at L4-L5.  L5-S1: Left eccentric disc bulge. There is moderate left foraminal narrowing. No significant canal narrowing.    Review of Systems: Patient complains of symptoms per HPI as well as the following symptoms:No CP, no SOB. Pertinent negatives per HPI. All others negative.    Social History   Socioeconomic History  . Marital status: Married    Spouse name: Not on file  . Number of children: 1  . Years of education: 16+  . Highest education level: Bachelor's degree (e.g., BA, AB, BS)  Occupational History  . Occupation: Retired  Scientific laboratory technician  . Financial resource strain: Not on file  . Food insecurity:    Worry: Not on file    Inability: Not on file  . Transportation needs:    Medical: Not on file    Non-medical: Not on file  Tobacco Use  . Smoking status: Never Smoker  . Smokeless tobacco: Never Used  Substance and Sexual Activity  . Alcohol use: No    Alcohol/week: 0.0 standard drinks  . Drug use: No  . Sexual activity: Yes  Partners: Female    Birth control/protection: None  Lifestyle  . Physical activity:    Days per week: Not on file    Minutes per session: Not on file  . Stress: Not on file  Relationships  . Social connections:    Talks on phone: Not on file    Gets together: Not on file    Attends religious service: Not on file    Active member of club or organization: Not on file    Attends meetings of clubs or organizations: Not on file    Relationship status: Not on file  . Intimate partner violence:    Fear of current or ex partner: Not on file    Emotionally abused: Not on file    Physically abused: Not on file    Forced sexual activity: Not on file  Other Topics Concern  . Not on file  Social History Narrative   Lives at home w/ his wife   Right-handed   Caffeine: 2 cups of  coffee each morning    Family History  Problem Relation Age of Onset  . COPD Father        family hx  . Emphysema Father   . Aneurysm Mother   . Stroke Mother   . Sudden death Mother   . Cancer Mother   . Obesity Mother   . Breast cancer Mother   . Cancer Maternal Grandfather        lung  . Colon cancer Neg Hx   . Colon polyps Neg Hx     Past Medical History:  Diagnosis Date  . ALLERGIC RHINITIS 04/22/2007  . Allergy   . Cancer (HCC)    HX SKIN CANCER  . Complication of anesthesia    more concerned about positioning of head & neck because of TMJ  . DDD (degenerative disc disease), cervical   . GERD (gastroesophageal reflux disease)    infrequently  . Headache(784.0)    migraines  . History of kidney stones   . History of skin cancer   . History of TMJ disorder   . HYPERCHOLESTEROLEMIA 04/22/2007  . HYPERLIPIDEMIA 07/29/2007  . Hyperplastic colon polyp   . NEPHROLITHIASIS, HX OF 07/29/2007  . Osteoarth NOS-Unspec 04/22/2007  . Shortness of breath   . Shoulder pain, acute    bilateral    Past Surgical History:  Procedure Laterality Date  . APPENDECTOMY    . BACK SURGERY  10/07/2015  . COLONOSCOPY     10 yrs ago- 2 HPP   . COLONOSCOPY  2019  . JOINT REPLACEMENT    . KNEE ARTHROSCOPY     right x2  . SHOULDER SURGERY     right  . skin cancer biopsy     L forehead  . TOTAL KNEE ARTHROPLASTY Right 08/25/2013   Procedure: RIGHT TOTAL KNEE ARTHROPLASTY;  Surgeon: Gearlean Alf, MD;  Location: [facility];  Service: Orthopedics;  Laterality: Right;  . TOTAL SHOULDER ARTHROPLASTY Left 12/25/2013   DR SUPPLE   . TOTAL SHOULDER ARTHROPLASTY Left 12/25/2013   Procedure: LEFT TOTAL SHOULDER ARTHROPLASTY;  Surgeon: Marin Shutter, MD;  Location: [facility];  Service: Orthopedics;  Laterality: Left;  . TOTAL SHOULDER ARTHROPLASTY Right 06/18/2014   DR SUPPLE  . TOTAL SHOULDER ARTHROPLASTY Right 06/18/2014   Procedure: RIGHT TOTAL SHOULDER ARTHROPLASTY;  Surgeon: Marin Shutter, MD;   Location: [facility];  Service: Orthopedics;  Laterality: Right;  . WISDOM TOOTH EXTRACTION      Current Outpatient  Medications  Medication Sig Dispense Refill  . allopurinol (ZYLOPRIM) 100 MG tablet TAKE 1 TABLET EVERY DAY 90 tablet 3  . amitriptyline (ELAVIL) 100 MG tablet Take 1 tablet (100 mg total) by mouth at bedtime. 90 tablet 4  . aspirin 81 MG tablet Take 81 mg by mouth daily.    . benzonatate (TESSALON) 100 MG capsule Take 1 capsule (100 mg total) by mouth 2 (two) times daily as needed for cough. 20 capsule 0  . Cholecalciferol (VITAMIN D PO) Take 1 tablet by mouth daily.    . Coenzyme Q10 (CO Q 10 PO) Take by mouth daily.    . Cyanocobalamin (VITAMIN B 12 PO) Take by mouth.    . dextromethorphan 15 MG/5ML syrup Take 10 mLs (30 mg total) by mouth 3 (three) times daily as needed for cough. 120 mL 0  . divalproex (DEPAKOTE) 250 MG DR tablet Take 1 tablet (250 mg total) by mouth 2 (two) times daily. 180 tablet 6  . DULoxetine (CYMBALTA) 60 MG capsule Take 1 capsule (60 mg total) by mouth daily. 90 capsule 4  . EPINEPHRINE 0.3 mg/0.3 mL IJ SOAJ injection INJECT 0.3 MLS INTO THE MUSCLE ONCE 2 mL 1  . gabapentin (NEURONTIN) 300 MG capsule Take 1 capsule (300 mg total) by mouth 2 (two) times daily. 180 capsule 4  . Gabapentin, Once-Daily, (GRALISE) 300 MG TABS Take 300 mg by mouth 2 (two) times daily. 60 tablet 11  . Multiple Vitamin (MULTIVITAMIN) tablet Take 1 tablet by mouth daily.    . naproxen (NAPROSYN) 500 MG tablet Take 500 mg by mouth 2 (two) times daily with a meal.    . Omega-3 Fatty Acids (FISH OIL) 1000 MG CAPS Take 1 capsule by mouth daily.    . pravastatin (PRAVACHOL) 40 MG tablet TAKE 1 TABLET EVERY DAY 90 tablet 3  . Psyllium (METAMUCIL PO) Take 2 tablets by mouth 2 (two) times daily.    . Red Yeast Rice Extract (RED YEAST RICE PO) Take 1 tablet by mouth daily.    . rizatriptan (MAXALT) 10 MG tablet Take 1 tablet (10 mg total) by mouth as needed for migraine. May repeat in 2  hours if needed 30 tablet 4  . SUMAtriptan (IMITREX) 100 MG tablet Take 1 tablet by mouth once as needed for migraine. May repeat in 2 hours if needed. Max 2 per 24 hours. 27 tablet 0  . tiZANidine (ZANAFLEX) 4 MG tablet Take 1 tablet (4 mg total) by mouth 3 (three) times daily as needed for muscle spasms. 270 tablet 4  . TURMERIC CURCUMIN PO Take 1,050 mg by mouth daily.    . valACYclovir (VALTREX) 500 MG tablet Take 500 mg by mouth daily.    . vitamin C (ASCORBIC ACID) 500 MG tablet Take 500 mg by mouth daily.    . vitamin E 1000 UNIT capsule Take 1,000 Units by mouth daily.    Eduard Roux (AIMOVIG) 140 MG/ML SOAJ Inject 140 mg into the skin every 30 (thirty) days. 4 pen 0   Current Facility-Administered Medications  Medication Dose Route Frequency Provider Last Rate Last Dose  . 0.9 %  sodium chloride infusion  500 mL Intravenous Once Ladene Artist, MD        Allergies as of 12/02/2018 - Review Complete 12/02/2018  Allergen Reaction Noted  . Shrimp [shellfish allergy] Anaphylaxis and Rash 05/22/2012  . Codeine Rash and Other (See Comments)   . Contrast media [iodinated diagnostic agents] Rash 11/11/2015  .  Diazepam Rash 06/26/2014  . Amoxicillin Other (See Comments)   . Benadryl [diphenhydramine]  10/23/2017  . Depakote [divalproex sodium] Swelling 07/30/2017  . Erythromycin Other (See Comments)   . Flexeril [cyclobenzaprine]  10/23/2017  . Betadine [povidone iodine] Rash 05/22/2012  . Celebrex [celecoxib] Rash 05/22/2012  . Meperidine and related Rash 05/22/2012  . Methocarbamol Rash 05/22/2012  . Neosporin [neomycin-bacitracin zn-polymyx] Rash 10/23/2017  . Oxycodone Rash 10/14/2015  . Penicillins Rash   . Plavix [clopidogrel] Rash 12/11/2013  . Rivaroxaban Rash 10/20/2013  . Vioxx [rofecoxib] Rash 05/22/2012    Vitals: BP 131/86 (BP Location: Left Arm, Patient Position: Sitting)   Pulse 90   Ht 5\' 9"  (1.753 m)   Wt 230 lb (104.3 kg)   BMI 33.97 kg/m  Last  Weight:  Wt Readings from Last 1 Encounters:  12/02/18 230 lb (104.3 kg)   Last Height:   Ht Readings from Last 1 Encounters:  12/02/18 5\' 9"  (1.753 m)   Physical exam: Exam: Gen: NAD, conversant, well nourised, obese, well groomed                     CV: RRR, no MRG. No Carotid Bruits. No peripheral edema, warm, nontender Eyes: Conjunctivae clear without exudates or hemorrhage  Neuro: Detailed Neurologic Exam  Speech:    Speech is normal; fluent and spontaneous with normal comprehension.  Cognition:    The patient is oriented to person, place, and time;     recent and remote memory intact;     language fluent;     normal attention, concentration,     fund of knowledge Cranial Nerves:    The pupils are equal, round, and reactive to light. The fundi are normal and spontaneous venous pulsations are present. Visual fields are full to finger confrontation. Extraocular movements are intact. Trigeminal sensation is intact and the muscles of mastication are normal. The face is symmetric. The palate elevates in the midline. Hearing intact. Voice is normal. Shoulder shrug is normal. The tongue has normal motion without fasciculations.   Coordination:    Normal finger to nose and heel to shin. Normal rapid alternating movements.   Gait:    Heel-toe and tandem gait are normal.   Motor Observation:    No asymmetry, no atrophy, and no involuntary movements noted. Tone:    Normal muscle tone.    Posture:    Posture is normal. normal erect    Strength:    Strength is V/V in the upper and lower limbs.      Sensation: intact to LT     Reflex Exam:  DTR's:    Deep tendon reflexes in the upper and lower extremities are normal bilaterally.   Toes:    The toes are downgoing bilaterally.   Clonus:    Clonus is absent.        Assessment/Plan:68 year old with chronic migraines. Significantly improved on Aimovig but worsened the last few months in the setting of back pain and  stress.  - Continue Aimovig for migraines which may be improving with the cpap, had long discussion with patient, 140mg  - [facility] He is struggling. His TCA and gabapentin may have something to do with his struggles, we tried to transition to [place] but did not tolerate continue capsules gabapentin IR, Aimovig 140, Amitriptyline 100, Duloxetine 60. He has follow up with [name] and is trying again.   - Tried Depakote for migraine prevention, did not tolerate (  swelling)  - Continue Cpap, feels better, reviewed his report doing well residual 29, 100% compliance with an average 6 hours and 6 minutes a night with borderline bit within normal leaks.  Tinnitus: Went to [facility], unclear reason why he has it and continues without improvement  Previous A/P:  Currently on 4 migraine preventatives: Cymbalta, Amitriptyline, Gralise and Aimovig. Has failed Topiramate (Tried: Amitriptyline, Topamax, Cymbalta, Rizatriptan, Aimovig, Melatonin, Coffe before bed (possibly hypnic headaches), : Tried: Amitriptyline, Topamax, Cymbalta, Rizatriptan, Aimovig, Melatonin, Coffe before bed (possibly hypnic headaches), Depakote (swelling, did not tolerate), Gralise, Tizanidine  Declines botox for migraines  Acute medication for migraines: Continue Sumatriptan/rizatriptan. Discussed medication overuse and not to take acute medications more than 10 days a month for migraines.   MRI brain wo contrast: normal  He has been to [facility] and did not have a good experience, declines further PT  Tinnitus. May have coincided with taking Naproxen, asked him to stop Naproxen. Went to [facility] for Tinnitus and they could not find anything   Sarina Ill, MD  [facility] [address] [place], [place] [address]  Phone [phone] Fax [phone]  A total of 25 minutes was spent face-to-face with this patient. Over half  this time was spent on counseling patient on the  1. Chronic migraine without aura, with intractable migraine, so stated, with status migrainosus   2. OSA (obstructive sleep apnea)   3. Tinnitus of both ears      diagnosis and different diagnostic and therapeutic options available.

## 2018-12-09 ENCOUNTER — Ambulatory Visit (INDEPENDENT_AMBULATORY_CARE_PROVIDER_SITE_OTHER): Payer: Medicare HMO | Admitting: Family Medicine

## 2018-12-09 NOTE — Progress Notes (Unsigned)
Office: [phone]  /  Fax: [phone]    Date: 12/11/2018  Time Seen: *** Duration: *** Provider: Glennie Isle, PsyD Type of Session: Intake for Individual Therapy  Type of Contact: Face-to-face  Informed Consent: The provider's role was explained to [name]. The provider reviewed and discussed issues of confidentiality, privacy, and limits therein. In addition to verbal informed consent, written informed consent for psychological services was obtained from [place] prior to the initial intake interview. Written consent included information concerning the practice, financial arrangements, and confidentiality and patients' rights. Since the clinic is not a 24/7 crisis center, mental health emergency resources were shared in the form of a handout, and the provider explained MyChart, e-mail, voicemail, and/or other messaging systems should be utilized only for non-emergency reasons. Erland verbally acknowledged understanding of the aforementioned, and agreed to use mental health emergency resources discussed if needed. Moreover, Clay agreed information may be shared with other [facility] providers as needed for coordination of care, and written consent was obtained.   Chief Complaint: [name] [name] was referred by [name] due to binge eating. Per the note for the visit with [name] on 11/26/2018, "[name] notes episodes of binge eating almost daily. He has a history of eating 1/2 gallon of ice cream at a time. He has a perfectionist personality and is often swinging from very resistive eating to overindulging."  During today's appointment, Jaxxson reported ***  Nox was asked to complete a questionnaire assessing various behaviors related to emotional eating. Hilton endorsed the following: {gbmoodandfood:21755}.  HPI:  Per the note for the initial visit with [name] on 06/18/2018, [name] reported experiencing the following: significant food cravings  issues , snacking frequently in the evenings, frequently eating larger portions than normal , struggling with emotional eating and skipping meals frequently.   During today's appointment, [name] reported ***  Mental Status Examination: Damareon arrived on time for the appointment. He presented as appropriately dressed and groomed. Woodrow appeared his stated age and demonstrated adequate orientation to time, place, person, and purpose of the appointment. He also demonstrated appropriate eye contact. No psychomotor abnormalities or behavioral peculiarities noted. His mood was {gbmood:21757} with congruent affect. His thought processes were logical, linear, and goal-directed. No hallucinations, delusions, bizarre thinking or behavior reported or observed. Judgment, insight, and impulse control appeared to be grossly intact. There was no evidence of paraphasias (i.e., errors in speech, gross mispronunciations, and word substitutions), repetition deficits, or disturbances in volume or prosody (i.e., rhythm and intonation). There was no evidence of attention or memory impairments. Stepan denied current suicidal and homicidal ideation, plan, and intent.   The Montreal Cognitive Assessment (MoCA) was administered. The MoCA assesses different cognitive domains: attention and concentration, executive functions, memory, language, visuoconstructional skills, conceptual thinking, calculations, and orientation. Isaah received *** out of 30 points possible on the MoCA, which is noted in the *** range. ***[A point was added to the total score due to years of formal education being 12 years or fewer.]   Family & Psychosocial History: ***  Medical History: ***  Mental Health History: ***   Duwan denied a trauma history, including {gbtrauma:22071} abuse, as well as neglect. [name] reported experiencing the following: {gbintakesxs:21966}  Lavell reported experiencing worry thoughts regarding the following:   Dawayne  denied experiencing the following: {gbsxs:21965}  He also denied history of and current suicidal ideation, plan, and intent; history of and current homicidal ideation, plan, and intent; and history of  and current engagement in self-harm.  The following strengths were reported by [name]: *** The following strengths were observed by this provider: {gbstrengths:22223}.  Legal History: Isamu {gblegal:22371} a history of legal involvement.   Structured Assessment Results: The Patient Health Questionnaire-9 (PHQ-9) is a self-report measure that assesses symptoms and severity of depression over the course of the last two weeks. Nimai obtained a score of *** suggesting {GBPHQ9SEVERITY:21752}. Effrey finds the endorsed symptoms to be {gbphq9difficulty:21754}.  The Generalized Anxiety Disorder-7 (GAD-7) is a brief self-report measure that assesses symptoms of anxiety over the course of the last two weeks. Ashkan obtained a score of *** suggesting {gbgad7severity:21753}.   Interventions: A chart review was conducted prior to the clinical intake interview. The MoCA***, PHQ-9, and GAD-7 were administered and a clinical intake interview was completed. In addition, Torian was asked to complete a Mood and Food questionnaire to assess various behaviors related to emotional eating. Throughout session, empathic reflections and validation was provided. Continuing treatment with this provider was discussed and a treatment goal was established. Psychoeducation regarding emotional versus physical hunger was provided. Nehemiah was given a handout to utilize between now and the next appointment to increase awareness of hunger patterns and subsequent eating. ***  Provisional DSM-5 Diagnosis: ***  Plan: Danford appears able and willing to participate as evidenced by collaboration on a treatment goal, engagement in reciprocal conversation, and asking questions as needed for clarification. The next appointment will be scheduled in  {gbweeks:21758}. The following treatment goal was established: {gbtxgoals:21759}. For the aforementioned goal, Bryar can benefit from biweekly individual therapy sessions that are brief in duration for approximately four to six sessions. The treatment modality will be individual therapeutic services, including an eclectic therapeutic approach utilizing techniques from Cognitive Behavioral Therapy, Patient Centered Therapy, Dialectical Behavior Therapy, Acceptance and Commitment Therapy, Interpersonal Therapy, and Cognitive Restructuring. Therapeutic approach will include various interventions as appropriate, such as validation, support, mindfulness, thought defusion, reframing, psychoeducation, values assessment, and role playing. This provider will regularly review the treatment plan and medical chart to keep informed of status changes. Taeveon expressed understanding and agreement with the initial treatment plan of care.

## 2018-12-10 ENCOUNTER — Telehealth: Payer: Self-pay | Admitting: Neurology

## 2018-12-10 NOTE — Telephone Encounter (Signed)
Thank you for taking the time to talk to our patient in detail. Nothing further needed. Since this is a novel situation, there may be a possibility to delay his appointment. Thankfully, he is compliant with his machine and benefits from it. Unfortunately, as it stands, Medicare does not allow for phone visit documentations. We may be able to offer telemetry medicine visits but we are not currently set up to do so in our office, we are, as I understand working on this. He may be able to get some more information regarding compliance visits for Medicare patients from his current DME company, thank you for for redirecting him to advanced home care for these questions.

## 2018-12-10 NOTE — Telephone Encounter (Addendum)
I called pt and had an extended conversation with him. He has numerous questions about insurance and cpap supplies being covered. I advised him several times to speak with [facility] and that unfortunately our office is unable to help him get supplies covered sooner, etc, it is all up to his insurance and DME provider. I advised him that I would reach out to [facility] on his behalf and find out if they need anything from [name].  Pt is also very concerned about his April 2nd follow up, the 31-90 day face to face regarding auto pap for insurance requirements. He reports that if he would come to the office for his appt both our staff and himself would be "in danger" of getting the virus. I advised that we don't have any control over insurance requirements. If pt opts to not come in for his 31-90 day post cpap start visit, he is at risk of losing his cpap if insurance doesn't relax their guidelines regarding the 31-90 day follow up while this virus is active. Pt wants to do a "phone call" visit or an e-visit if this becomes available. I advised him that I will speak with our management about this possibility. Pt verbalized understanding.

## 2018-12-10 NOTE — Telephone Encounter (Signed)
Pt states that he has been using the machine which has been perfect. But he states that the supply company has been constantly calling him trying to sell him supplies and he understands that it was explained to him that he can not order supplies unless its been 30 days and his insurance company has been informed. Pt would like to be called and explained exactly what it is he can and can't do. Please advise.

## 2018-12-11 ENCOUNTER — Ambulatory Visit (INDEPENDENT_AMBULATORY_CARE_PROVIDER_SITE_OTHER): Payer: Medicare HMO | Admitting: Psychology

## 2018-12-12 DIAGNOSIS — G4733 Obstructive sleep apnea (adult) (pediatric): Secondary | ICD-10-CM | POA: Diagnosis not present

## 2018-12-19 ENCOUNTER — Encounter (INDEPENDENT_AMBULATORY_CARE_PROVIDER_SITE_OTHER): Payer: Self-pay

## 2018-12-23 ENCOUNTER — Encounter: Payer: Self-pay | Admitting: Neurology

## 2018-12-23 ENCOUNTER — Telehealth: Payer: Self-pay

## 2018-12-23 ENCOUNTER — Ambulatory Visit: Payer: Medicare HMO | Admitting: Neurology

## 2018-12-23 NOTE — Telephone Encounter (Signed)
Due to current COVID 19 pandemic, our office is severely reducing in office visits for at least the next 2 weeks, in order to minimize the risk to our patients and healthcare providers.   I called pt, offered him a virtual visit at his appt on 12/26/2018. Pt is agreeable to a virtual visit.  Pt understands that although there may be some limitations with this type of visit, we will take all precautions to reduce any security or privacy concerns.  Pt understands that this will be treated like an in office visit and we will file with pt's insurance, and there may be a patient responsible charge related to this service.  Pt's email is [email or web address]. Pt understands that the [employer] webex software must be downloaded and operational on the device pt plans to use for the visit.  Pt's meds, allergies, and PMH were updated.

## 2018-12-24 ENCOUNTER — Ambulatory Visit (INDEPENDENT_AMBULATORY_CARE_PROVIDER_SITE_OTHER): Payer: Medicare HMO | Admitting: Family Medicine

## 2018-12-25 ENCOUNTER — Other Ambulatory Visit: Payer: Self-pay | Admitting: Family Medicine

## 2018-12-25 ENCOUNTER — Other Ambulatory Visit: Payer: Self-pay

## 2018-12-25 ENCOUNTER — Encounter: Payer: Self-pay | Admitting: Family Medicine

## 2018-12-25 DIAGNOSIS — J069 Acute upper respiratory infection, unspecified: Secondary | ICD-10-CM

## 2018-12-25 DIAGNOSIS — B9789 Other viral agents as the cause of diseases classified elsewhere: Principal | ICD-10-CM

## 2018-12-25 MED ORDER — BENZONATATE 100 MG PO CAPS: 100.0000 mg | ORAL_CAPSULE | Freq: Two times a day (BID) | ORAL | 1 refills | Status: DC | PRN

## 2018-12-26 ENCOUNTER — Encounter: Payer: Self-pay | Admitting: Neurology

## 2018-12-26 ENCOUNTER — Ambulatory Visit (INDEPENDENT_AMBULATORY_CARE_PROVIDER_SITE_OTHER): Payer: Medicare HMO | Admitting: Neurology

## 2018-12-26 ENCOUNTER — Other Ambulatory Visit: Payer: Self-pay

## 2018-12-26 DIAGNOSIS — G4733 Obstructive sleep apnea (adult) (pediatric): Secondary | ICD-10-CM

## 2018-12-26 DIAGNOSIS — Z9989 Dependence on other enabling machines and devices: Secondary | ICD-10-CM | POA: Diagnosis not present

## 2018-12-26 NOTE — Telephone Encounter (Signed)
Spoke with pt verbalized understanding the cough syrup is OTC and he can get it from [place], pt states that he has received the [place] Rx from the pharmacy. Nothing further needed

## 2018-12-26 NOTE — Patient Instructions (Signed)
Given verbally, during today's virtual video-based encounter, with verbal feedback received.   

## 2018-12-26 NOTE — Telephone Encounter (Signed)
Rx for Tessalon was sent to [facility], pt notified

## 2018-12-26 NOTE — Progress Notes (Signed)
Interim history:   [name] [name] is a 68 year old right-handed gentleman with an underlying medical history of allergic rhinitis, degenerative disc disease, reflux disease, chronic migraines, history of kidney stone, history of skin cancer, TMJ disorder, hyperlipidemia, status post multiple surgeries including right total knee replacement surgery, right-sided shoulder surgery, left shoulder replacement surgery, right shoulder replacement surgery, lower back surgery, and borderline obesity, with whom I am conducting a virtual, video based follow-up appointment via Webex, in lieu of a face-to-face visit, for follow-up consultation of his obstructive sleep apnea, after repeat home sleep testing recently. The patient is unaccompanied today and joins from home. I last saw him on 10/16/2018, at which time he reported weight gain in the realm of 30 pounds within a year. For the family history of OSA including a brother and a sister with sleep apnea. He was motivated to get retested for sleep apnea and consider AutoPap or CPAP therapy.  Today, 12/26/2018: Please also see below for documentation of the virtual visit.  I reviewed his AutoPap compliance data from 11/24/2018 through 12/23/2018 which is a total of 30 days, during which time he used his machine every night with percent used days greater than 4 hours at 90%, indicating excellent compliance with an average usage of 6 hours and 4 minutes, residual AHI at goal at 4.2 per hour, 95th percentile of pressure at 12.6 cm, leak on the higher end with the 95th percentile at 18.9 L/m on a pressure range of 7 cm to 14 cm with EPR.  The patient's allergies, current medications, family history, past medical history, past social history, past surgical history and problem list were reviewed and updated as appropriate.    Previously (copied from previous notes for reference):   I first met him about 2 years ago on 10/25/2016 at the request of Dr. Jaynee Eagles, at which time  he reported snoring, daytime somnolence, and morning headaches. He was advised to proceed with a sleep study. He had a home sleep test on 11/28/2016 which indicated mild obstructive sleep apnea with an AHI of 7.8 per hour and O2 nadir of 85%. He was advised to consider AutoPap therapy because of recurrent headaches and the possibility of improving headaches with treatment. He did not pursue AutoPap therapy at the time.   10/25/2016: (He) reports snoring and excessive daytime somnolence as well as morning headaches and waking up not refreshed. I reviewed your office note from 10/06/2016. His Epworth sleepiness score is 4 out of 24 today, his fatigue score is 12 out of 63. He had a brain MRI wo on 10/19/16, which I reviewed: results were normal for age.  Tried amitriptyline, which did not help, and topamax caused SEs, currently on Cymbalta, no SEs thus far.  Never tried the Ambien for fear of SEs.  Denies RLS symptoms, but has pain in various joint areas, cannot sleep on his back.  Was overusing triptan, nearly daily intake and was counseled by you to Not use it on a daily basis, he stopped using Imitrex for about 10 days and started having some nervousness, night sweats and possible signs of withdrawal. He also had a flareup of his migraine headaches, lasting for over 12 hours at a time. He has undergone lumbar spine surgery under Dr. Harl Bowie in [place] in January 2017. He has inability to sleep on his back, tosses and turns a lot because of ongoing issues with joint pain and not being able to sleep in one position for any length of time. He  takes 2 Tylenol PM at night, but time is usually after 11 PM, but typically before midnight, wakeup time without alarm around 6:30. He has nocturia about once or twice per average night, has nearly daily morning headaches. He lives with his wife, he has a 13 year old daughter who lives in [place] and expecting her first child any day. He is a nonsmoker, does not  drink alcohol, drinks caffeine in the form of coffee, 2-3 cups per day. He is retired.   His Past Medical History Is Significant For: Past Medical History:  Diagnosis Date  . ALLERGIC RHINITIS 04/22/2007  . Allergy   . Cancer (HCC)    HX SKIN CANCER  . Complication of anesthesia    more concerned about positioning of head & neck because of TMJ  . DDD (degenerative disc disease), cervical   . GERD (gastroesophageal reflux disease)    infrequently  . Headache(784.0)    migraines  . History of kidney stones   . History of skin cancer   . History of TMJ disorder   . HYPERCHOLESTEROLEMIA 04/22/2007  . HYPERLIPIDEMIA 07/29/2007  . Hyperplastic colon polyp   . NEPHROLITHIASIS, HX OF 07/29/2007  . Osteoarth NOS-Unspec 04/22/2007  . Shortness of breath   . Shoulder pain, acute    bilateral    His Past Surgical History Is Significant For: Past Surgical History:  Procedure Laterality Date  . APPENDECTOMY    . BACK SURGERY  10/07/2015  . COLONOSCOPY     10 yrs ago- 2 HPP   . COLONOSCOPY  2019  . JOINT REPLACEMENT    . KNEE ARTHROSCOPY     right x2  . SHOULDER SURGERY     right  . skin cancer biopsy     L forehead  . TOTAL KNEE ARTHROPLASTY Right 08/25/2013   Procedure: RIGHT TOTAL KNEE ARTHROPLASTY;  Surgeon: Gearlean Alf, MD;  Location: [facility];  Service: Orthopedics;  Laterality: Right;  . TOTAL SHOULDER ARTHROPLASTY Left 12/25/2013   DR SUPPLE   . TOTAL SHOULDER ARTHROPLASTY Left 12/25/2013   Procedure: LEFT TOTAL SHOULDER ARTHROPLASTY;  Surgeon: Marin Shutter, MD;  Location: [facility];  Service: Orthopedics;  Laterality: Left;  . TOTAL SHOULDER ARTHROPLASTY Right 06/18/2014   DR SUPPLE  . TOTAL SHOULDER ARTHROPLASTY Right 06/18/2014   Procedure: RIGHT TOTAL SHOULDER ARTHROPLASTY;  Surgeon: Marin Shutter, MD;  Location: [facility];  Service: Orthopedics;  Laterality: Right;  . WISDOM TOOTH EXTRACTION      His Family History Is Significant For: Family History  Problem Relation Age  of Onset  . COPD Father        family hx  . Emphysema Father   . Aneurysm Mother   . Stroke Mother   . Sudden death Mother   . Cancer Mother   . Obesity Mother   . Breast cancer Mother   . Cancer Maternal Grandfather        lung  . Colon cancer Neg Hx   . Colon polyps Neg Hx     His Social History Is Significant For: Social History   Socioeconomic History  . Marital status: Married    Spouse name: Not on file  . Number of children: 1  . Years of education: 16+  . Highest education level: Bachelor's degree (e.g., BA, AB, BS)  Occupational History  . Occupation: Retired  Scientific laboratory technician  . Financial resource strain: Not on file  . Food insecurity:    Worry: Not on file  Inability: Not on file  . Transportation needs:    Medical: Not on file    Non-medical: Not on file  Tobacco Use  . Smoking status: Never Smoker  . Smokeless tobacco: Never Used  Substance and Sexual Activity  . Alcohol use: No    Alcohol/week: 0.0 standard drinks  . Drug use: No  . Sexual activity: Yes    Partners: Female    Birth control/protection: None  Lifestyle  . Physical activity:    Days per week: Not on file    Minutes per session: Not on file  . Stress: Not on file  Relationships  . Social connections:    Talks on phone: Not on file    Gets together: Not on file    Attends religious service: Not on file    Active member of club or organization: Not on file    Attends meetings of clubs or organizations: Not on file    Relationship status: Not on file  Other Topics Concern  . Not on file  Social History Narrative   Lives at home w/ his wife   Right-handed   Caffeine: 2 cups of coffee each morning    His Allergies Are:  Allergies  Allergen Reactions  . Shrimp [Shellfish Allergy] Anaphylaxis and Rash  . Codeine Rash and Other (See Comments)    Blacked out  . Contrast Media [Iodinated Diagnostic Agents] Rash  . Diazepam Rash    Rash over whole body from neck down, and he  lost his voice (airway compromise?)  . Amoxicillin Other (See Comments)    Unknown   . Benadryl [Diphenhydramine]     With dyes   . Depakote [Divalproex Sodium] Swelling    Per pts report (given by Dr. Inda Merlin).  Has throat swelling and rash.  . Erythromycin Other (See Comments)    unknown  . Flexeril [Cyclobenzaprine]   . Betadine [Povidone Iodine] Rash  . Celebrex [Celecoxib] Rash  . Meperidine And Related Rash  . Methocarbamol Rash  . Neosporin [Neomycin-Bacitracin Zn-Polymyx] Rash  . Oxycodone Rash  . Penicillins Rash  . Plavix [Clopidogrel] Rash  . Rivaroxaban Rash  . Vioxx [Rofecoxib] Rash  :   His Current Medications Are:  Outpatient Encounter Medications as of 12/26/2018  Medication Sig  . allopurinol (ZYLOPRIM) 100 MG tablet TAKE 1 TABLET EVERY DAY  . amitriptyline (ELAVIL) 100 MG tablet Take 1 tablet (100 mg total) by mouth at bedtime.  Marland Kitchen aspirin 81 MG tablet Take 81 mg by mouth daily.  . benzonatate (TESSALON) 100 MG capsule Take 1 capsule (100 mg total) by mouth 2 (two) times daily as needed for cough.  . Cholecalciferol (VITAMIN D PO) Take 1 tablet by mouth daily.  . Coenzyme Q10 (CO Q 10 PO) Take by mouth daily.  . Cyanocobalamin (VITAMIN B 12 PO) Take by mouth.  . dextromethorphan 15 MG/5ML syrup Take 10 mLs (30 mg total) by mouth 3 (three) times daily as needed for cough.  . divalproex (DEPAKOTE) 250 MG DR tablet Take 1 tablet (250 mg total) by mouth 2 (two) times daily.  . DULoxetine (CYMBALTA) 60 MG capsule Take 1 capsule (60 mg total) by mouth daily.  Marland Kitchen EPINEPHRINE 0.3 mg/0.3 mL IJ SOAJ injection INJECT 0.3 MLS INTO THE MUSCLE ONCE  . Erenumab-aooe (AIMOVIG) 140 MG/ML SOAJ Inject 140 mg into the skin every 30 (thirty) days.  Marland Kitchen gabapentin (NEURONTIN) 300 MG capsule Take 1 capsule (300 mg total) by mouth 2 (two) times daily.  Marland Kitchen  Gabapentin, Once-Daily, (GRALISE) 300 MG TABS Take 300 mg by mouth 2 (two) times daily.  . Multiple Vitamin (MULTIVITAMIN) tablet Take  1 tablet by mouth daily.  . naproxen (NAPROSYN) 500 MG tablet Take 500 mg by mouth 2 (two) times daily with a meal.  . Omega-3 Fatty Acids (FISH OIL) 1000 MG CAPS Take 1 capsule by mouth daily.  . pravastatin (PRAVACHOL) 40 MG tablet TAKE 1 TABLET EVERY DAY  . Psyllium (METAMUCIL PO) Take 2 tablets by mouth 2 (two) times daily.  . Red Yeast Rice Extract (RED YEAST RICE PO) Take 1 tablet by mouth daily.  . rizatriptan (MAXALT) 10 MG tablet Take 1 tablet (10 mg total) by mouth as needed for migraine. May repeat in 2 hours if needed  . SUMAtriptan (IMITREX) 100 MG tablet Take 1 tablet by mouth once as needed for migraine. May repeat in 2 hours if needed. Max 2 per 24 hours.  Marland Kitchen tiZANidine (ZANAFLEX) 4 MG tablet Take 1 tablet (4 mg total) by mouth 3 (three) times daily as needed for muscle spasms.  . TURMERIC CURCUMIN PO Take 1,050 mg by mouth daily.  . valACYclovir (VALTREX) 500 MG tablet Take 500 mg by mouth daily.  . vitamin C (ASCORBIC ACID) 500 MG tablet Take 500 mg by mouth daily.  . vitamin E 1000 UNIT capsule Take 1,000 Units by mouth daily.   Facility-Administered Encounter Medications as of 12/26/2018  Medication  . 0.9 %  sodium chloride infusion  :  Review of Systems:  Out of a complete 14 point review of systems, all are reviewed and negative with the exception of these symptoms as listed below:  Virtual Visit via Video Note on 12/26/2018:  I connected with [name] [name] on 12/26/18 at 11:30 AM EDT by a video enabled telemedicine application and verified that I am speaking with the correct person using two identifiers.   I discussed the limitations of evaluation and management by telemedicine and the availability of in person appointments. The patient expressed understanding and agreed to proceed.  History of Present Illness: He reports having had 11 migraines in the month of March. He often goes to bed with a headache. He tries to exercise in the form of walking outside if possible.  He is compliant with AutoPap and has adjusted well. He uses a nasal mask and is endorsing improvements with regards to daytime sleepiness, daytime energy and sleep quality. He is motivated to continue and work on wt loss.   Observations/Objective:  The most recent available vital signs for review are from 12/02/2018: Blood pressure 131/86 with pulse of 90, weight 234 BMI of 33.97.  He is pleasant, conversant, NAD. Face symmetric, speech, clear, normal enunciation, normal facial animation. Extraocular movements appear full. He is able to move both upper extremities freely, coordination is grossly intact.  Assessment and Plan: In summary,[name] [name] a 60 year oldmalewith an underlying medical history of allergic rhinitis, degenerative disc disease, reflux disease, chronic migraines, history of kidney stone, history of skin cancer, TMJ disorder, hyperlipidemia, status post multiple surgeries including right total knee replacement surgery, right-sided shoulder surgery, left shoulder replacement surgery, right shoulder replacement surgery,lower back surgery,andobesity, who presents for virtual FU visit via Webex for his OSA, now established on AutoPap therapy. He is compliant with PAP and endorses improvement of sleep quality and daytime energy, perhaps some lessening of HA frequency at this time. He is motivated to continue with treatment and also work on weight loss. He is commended for his  treatment adherence. He is using a nasal mask without problems. His compliance looks good. He is stable enough for 1 year checkup. He is advised to follow-up routinely with one of our nurse practitioners. He has an appointment pending with Dr. Jaynee Eagles in July. He is reminded to keep that for ongoing migraine management as well. I answered allhisquestions today and the patient wasin agreement.  Follow Up Instructions:  1. Continue AutoPap with full compliance. 2. Follow-up in one year with nurse  practitioner in sleep clinic. 3. Try to pursue weight loss in the interim. 4. Stay up-to-date with PAP related supplies.   I discussed the assessment and treatment plan with the patient. The patient was provided an opportunity to ask questions and all were answered. The patient agreed with the plan and demonstrated an understanding of the instructions.   The patient was advised to call back or seek an in-person evaluation if the symptoms worsen or if the condition fails to improve as anticipated.  I provided 25 minutes of non-face-to-face time during this encounter.   [name], MD

## 2019-01-02 ENCOUNTER — Telehealth (INDEPENDENT_AMBULATORY_CARE_PROVIDER_SITE_OTHER): Payer: Self-pay | Admitting: Psychology

## 2019-01-02 NOTE — Telephone Encounter (Signed)
  Office: [phone]  /  Fax: [phone]  Date of Call: January 02, 2019 Time of Call: 2:25pm Provider: Glennie Isle, PsyD  CONTENT: This provider called [name] to check-in and schedule an initial appointment. A HIPAA compliant voicemail was left requesting a call back.  PLAN: This provider will wait for Srijan to call back or send a MyChart message to schedule an appointment.

## 2019-01-16 ENCOUNTER — Ambulatory Visit (INDEPENDENT_AMBULATORY_CARE_PROVIDER_SITE_OTHER): Payer: Medicare HMO | Admitting: Family Medicine

## 2019-01-16 ENCOUNTER — Encounter: Payer: Self-pay | Admitting: Family Medicine

## 2019-01-16 ENCOUNTER — Other Ambulatory Visit: Payer: Self-pay

## 2019-01-16 ENCOUNTER — Telehealth: Payer: Self-pay | Admitting: Family Medicine

## 2019-01-16 DIAGNOSIS — Z889 Allergy status to unspecified drugs, medicaments and biological substances status: Secondary | ICD-10-CM

## 2019-01-16 DIAGNOSIS — R05 Cough: Secondary | ICD-10-CM | POA: Diagnosis not present

## 2019-01-16 DIAGNOSIS — G4733 Obstructive sleep apnea (adult) (pediatric): Secondary | ICD-10-CM

## 2019-01-16 DIAGNOSIS — R059 Cough, unspecified: Secondary | ICD-10-CM

## 2019-01-16 MED ORDER — EPINEPHRINE 0.3 MG/0.3ML IJ SOAJ: INTRAMUSCULAR | 2 refills | Status: DC

## 2019-01-16 NOTE — Telephone Encounter (Signed)
Pt is scheduled for a Web ex appointment this afternoon at 1.30 pm with dr Volanda [name]

## 2019-01-16 NOTE — Progress Notes (Signed)
Virtual Visit via Video Note  I connected with [name] [name] on 01/16/19 at  1:30 PM EDT by a video enabled telemedicine application and verified that I am speaking with the correct person using two identifiers.  Location patient: home Location provider:home office Persons participating in the virtual visit: patient, provider  I discussed the limitations of evaluation and management by telemedicine and the availability of in person appointments. The patient expressed understanding and agreed to proceed.   HPI: May have a bad coughing spell a few times a day.  Drinking warm fluids and using cough drops, tessalon, delsym at night. Pt states he does not think he has issues with seasonal allergies.  In the past pt took Allegra but it caused a rash on arms.  Pt with extensive allergies to medications, has old epi pen at home.  Using CPAP nightly.  Has carpet in home, not using a bagless vacuum.    Pt does not want to come in to the office, afraid of getting COVID-19.  Wearing a face mask when out, having groceries delivered.  Was unsure if he should wear a mask when going on his daily walks.  Was wearing a face mask and using 3 coffee filters in between the layers of fabric.  States it was hard to breathe during his walks, so he had to stop wearing that mask.   ROS: See pertinent positives and negatives per HPI.  Past Medical History:  Diagnosis Date  . ALLERGIC RHINITIS 04/22/2007  . Allergy   . Cancer (HCC)    HX SKIN CANCER  . Complication of anesthesia    more concerned about positioning of head & neck because of TMJ  . DDD (degenerative disc disease), cervical   . GERD (gastroesophageal reflux disease)    infrequently  . Headache(784.0)    migraines  . History of kidney stones   . History of skin cancer   . History of TMJ disorder   . HYPERCHOLESTEROLEMIA 04/22/2007  . HYPERLIPIDEMIA 07/29/2007  . Hyperplastic colon polyp   . NEPHROLITHIASIS, HX OF 07/29/2007  . Osteoarth NOS-Unspec  04/22/2007  . Shortness of breath   . Shoulder pain, acute    bilateral    Past Surgical History:  Procedure Laterality Date  . APPENDECTOMY    . BACK SURGERY  10/07/2015  . COLONOSCOPY     10 yrs ago- 2 HPP   . COLONOSCOPY  2019  . JOINT REPLACEMENT    . KNEE ARTHROSCOPY     right x2  . SHOULDER SURGERY     right  . skin cancer biopsy     L forehead  . TOTAL KNEE ARTHROPLASTY Right 08/25/2013   Procedure: RIGHT TOTAL KNEE ARTHROPLASTY;  Surgeon: Gearlean Alf, MD;  Location: [facility];  Service: Orthopedics;  Laterality: Right;  . TOTAL SHOULDER ARTHROPLASTY Left 12/25/2013   DR SUPPLE   . TOTAL SHOULDER ARTHROPLASTY Left 12/25/2013   Procedure: LEFT TOTAL SHOULDER ARTHROPLASTY;  Surgeon: Marin Shutter, MD;  Location: [facility];  Service: Orthopedics;  Laterality: Left;  . TOTAL SHOULDER ARTHROPLASTY Right 06/18/2014   DR SUPPLE  . TOTAL SHOULDER ARTHROPLASTY Right 06/18/2014   Procedure: RIGHT TOTAL SHOULDER ARTHROPLASTY;  Surgeon: Marin Shutter, MD;  Location: [facility];  Service: Orthopedics;  Laterality: Right;  . WISDOM TOOTH EXTRACTION      Family History  Problem Relation Age of Onset  . COPD Father        family hx  . Emphysema Father   .  Aneurysm Mother   . Stroke Mother   . Sudden death Mother   . Cancer Mother   . Obesity Mother   . Breast cancer Mother   . Cancer Maternal Grandfather        lung  . Colon cancer Neg Hx   . Colon polyps Neg Hx     SOCIAL HX:    Current Outpatient Medications:  .  allopurinol (ZYLOPRIM) 100 MG tablet, TAKE 1 TABLET EVERY DAY, Disp: 90 tablet, Rfl: 3 .  amitriptyline (ELAVIL) 100 MG tablet, Take 1 tablet (100 mg total) by mouth at bedtime., Disp: 90 tablet, Rfl: 4 .  aspirin 81 MG tablet, Take 81 mg by mouth daily., Disp: , Rfl:  .  benzonatate (TESSALON) 100 MG capsule, Take 1 capsule (100 mg total) by mouth 2 (two) times daily as needed for cough., Disp: 20 capsule, Rfl: 1 .  Cholecalciferol (VITAMIN D PO), Take 1 tablet by  mouth daily., Disp: , Rfl:  .  Coenzyme Q10 (CO Q 10 PO), Take by mouth daily., Disp: , Rfl:  .  Cyanocobalamin (VITAMIN B 12 PO), Take by mouth., Disp: , Rfl:  .  dextromethorphan 15 MG/5ML syrup, Take 10 mLs (30 mg total) by mouth 3 (three) times daily as needed for cough., Disp: 120 mL, Rfl: 0 .  divalproex (DEPAKOTE) 250 MG DR tablet, Take 1 tablet (250 mg total) by mouth 2 (two) times daily., Disp: 180 tablet, Rfl: 6 .  DULoxetine (CYMBALTA) 60 MG capsule, Take 1 capsule (60 mg total) by mouth daily., Disp: 90 capsule, Rfl: 4 .  EPINEPHRINE 0.3 mg/0.3 mL IJ SOAJ injection, INJECT 0.3 MLS INTO THE MUSCLE ONCE, Disp: 2 mL, Rfl: 1 .  Erenumab-aooe (AIMOVIG) 140 MG/ML SOAJ, Inject 140 mg into the skin every 30 (thirty) days., Disp: 4 pen, Rfl: 0 .  gabapentin (NEURONTIN) 300 MG capsule, Take 1 capsule (300 mg total) by mouth 2 (two) times daily., Disp: 180 capsule, Rfl: 4 .  Gabapentin, Once-Daily, (GRALISE) 300 MG TABS, Take 300 mg by mouth 2 (two) times daily., Disp: 60 tablet, Rfl: 11 .  Multiple Vitamin (MULTIVITAMIN) tablet, Take 1 tablet by mouth daily., Disp: , Rfl:  .  naproxen (NAPROSYN) 500 MG tablet, Take 500 mg by mouth 2 (two) times daily with a meal., Disp: , Rfl:  .  Omega-3 Fatty Acids (FISH OIL) 1000 MG CAPS, Take 1 capsule by mouth daily., Disp: , Rfl:  .  pravastatin (PRAVACHOL) 40 MG tablet, TAKE 1 TABLET EVERY DAY, Disp: 90 tablet, Rfl: 3 .  Psyllium (METAMUCIL PO), Take 2 tablets by mouth 2 (two) times daily., Disp: , Rfl:  .  Red Yeast Rice Extract (RED YEAST RICE PO), Take 1 tablet by mouth daily., Disp: , Rfl:  .  rizatriptan (MAXALT) 10 MG tablet, Take 1 tablet (10 mg total) by mouth as needed for migraine. May repeat in 2 hours if needed, Disp: 30 tablet, Rfl: 4 .  SUMAtriptan (IMITREX) 100 MG tablet, Take 1 tablet by mouth once as needed for migraine. May repeat in 2 hours if needed. Max 2 per 24 hours., Disp: 27 tablet, Rfl: 0 .  tiZANidine (ZANAFLEX) 4 MG tablet, Take  1 tablet (4 mg total) by mouth 3 (three) times daily as needed for muscle spasms., Disp: 270 tablet, Rfl: 4 .  TURMERIC CURCUMIN PO, Take 1,050 mg by mouth daily., Disp: , Rfl:  .  valACYclovir (VALTREX) 500 MG tablet, Take 500 mg by mouth daily., Disp: ,  Rfl:  .  vitamin C (ASCORBIC ACID) 500 MG tablet, Take 500 mg by mouth daily., Disp: , Rfl:  .  vitamin E 1000 UNIT capsule, Take 1,000 Units by mouth daily., Disp: , Rfl:   Current Facility-Administered Medications:  .  0.9 %  sodium chloride infusion, 500 mL, Intravenous, Once, Ladene Artist, MD  EXAM:  VITALS per patient if applicable:  RR between 12-20 bpm  GENERAL: alert, oriented, appears well and in no acute distress  HEENT: atraumatic, conjunctiva clear, no obvious abnormalities on inspection of external nose and ears  NECK: normal movements of the head and neck  LUNGS: on inspection no signs of respiratory distress, breathing rate appears normal, no obvious gross SOB, gasping or wheezing  CV: no obvious cyanosis  MS: moves all visible extremities without noticeable abnormality  PSYCH/NEURO: pleasant and cooperative, no obvious depression or anxiety, speech and thought processing grossly intact  ASSESSMENT AND PLAN:  Discussed the following assessment and plan:  Cough  -discussed various causes including seasonal allergies. -given pt's multiple and often severe medication allergies hesitant to try new meds.  Discussed using saline nasal rinse. -given duration of cough will obtain CXR.  Pt hesitant to come to office, will consider coming in next wk for imaging. -continue current therapies, cough drops, warm fluids, tessalon, and OTC cough meds prn. - Plan: DG Chest 2 View -given precautions  OSA (obstructive sleep apnea) -continue CPAP -ensure cleaning machine regularly  Multiple drug allergies  -pt's with extensive (20) medication allergies and shellfish allergy. -allergies reviewed. - Plan: EPINEPHrine 0.3  mg/0.3 mL IJ SOAJ injection   I discussed the assessment and treatment plan with the patient. The patient was provided an opportunity to ask questions and all were answered. The patient agreed with the plan and demonstrated an understanding of the instructions.   The patient was advised to call back or seek an in-person evaluation if the symptoms worsen or if the condition fails to improve as anticipated.  [name], MD

## 2019-01-16 NOTE — Telephone Encounter (Signed)
Copied from [id number]. Topic: Quick Communication - See Telephone Encounter >> Jan 16, 2019 11:02 AM Rutherford Nail, NT wrote: CRM for notification. See Telephone encounter for: 01/16/19. Patient calling and states that he was seen on 11/25/2018 for URI and bronchitis. States that he was prescribed some medication (benzonatate (TESSALON) 100 MG capsule and dextromethorphan 15 MG/5ML syrup) to help and it has. He states that he gets the coughing fits about 2-3 times a day and they last about 30 mins each. States that there is phlegm that he is coughing up and some congestion. Would like to know if there is a stronger medication that could get his cough to go away or how much longer does Dr Volanda [name] think this could go on? States that he has been isolating himself from the public and if he has to go out he wears a mask. Please advise.

## 2019-01-17 ENCOUNTER — Ambulatory Visit (INDEPENDENT_AMBULATORY_CARE_PROVIDER_SITE_OTHER): Payer: Medicare HMO

## 2019-01-17 DIAGNOSIS — R059 Cough, unspecified: Secondary | ICD-10-CM

## 2019-01-17 DIAGNOSIS — R05 Cough: Secondary | ICD-10-CM | POA: Diagnosis not present

## 2019-01-20 ENCOUNTER — Encounter: Payer: Self-pay | Admitting: Family Medicine

## 2019-02-07 DIAGNOSIS — G4733 Obstructive sleep apnea (adult) (pediatric): Secondary | ICD-10-CM | POA: Diagnosis not present

## 2019-02-18 ENCOUNTER — Other Ambulatory Visit: Payer: Self-pay

## 2019-02-18 MED ORDER — SUMATRIPTAN SUCCINATE 100 MG PO TABS: ORAL_TABLET | ORAL | 0 refills | Status: DC

## 2019-03-03 DIAGNOSIS — G4733 Obstructive sleep apnea (adult) (pediatric): Secondary | ICD-10-CM | POA: Diagnosis not present

## 2019-04-01 ENCOUNTER — Encounter (INDEPENDENT_AMBULATORY_CARE_PROVIDER_SITE_OTHER): Payer: Self-pay | Admitting: Family Medicine

## 2019-04-02 ENCOUNTER — Ambulatory Visit: Payer: Medicare HMO | Admitting: Neurology

## 2019-04-04 DIAGNOSIS — G4733 Obstructive sleep apnea (adult) (pediatric): Secondary | ICD-10-CM | POA: Diagnosis not present

## 2019-04-09 ENCOUNTER — Encounter (INDEPENDENT_AMBULATORY_CARE_PROVIDER_SITE_OTHER): Payer: Self-pay

## 2019-04-10 ENCOUNTER — Encounter (INDEPENDENT_AMBULATORY_CARE_PROVIDER_SITE_OTHER): Payer: Self-pay | Admitting: Family Medicine

## 2019-04-15 ENCOUNTER — Ambulatory Visit (INDEPENDENT_AMBULATORY_CARE_PROVIDER_SITE_OTHER): Payer: Medicare HMO | Admitting: Psychology

## 2019-04-15 ENCOUNTER — Other Ambulatory Visit: Payer: Self-pay

## 2019-04-15 DIAGNOSIS — F3289 Other specified depressive episodes: Secondary | ICD-10-CM

## 2019-04-15 NOTE — Progress Notes (Signed)
Office: [phone]  /  Fax: [phone]    Date: April 15, 2019    Appointment Start Time: 11:59am Duration: 58 minutes Provider: Glennie Isle, Psy.D. Type of Session: Intake for Individual Therapy  Location of Patient: Home Location of Provider: Provider's Home Type of Contact: Telepsychological Visit via [employer] WebEx  Informed Consent: Prior to proceeding with today's appointment, two pieces of identifying information were obtained from [name] [name] to verify identity. In addition, [name]'s physical location at the time of this appointment was obtained. Belal reported he was at home and provided the address. In the event of technical difficulties, [name] shared a phone number he could be reached at. [name] [name] and this provider participated in today's telepsychological service. Also, Yancey denied anyone else being present in the room or on the WebEx appointment.   The provider's role was explained to [name]. The provider reviewed and discussed issues of confidentiality, privacy, and limits therein (e.g., reporting obligations). In addition to verbal informed consent, written informed consent for psychological services was obtained from [name] prior to the initial intake interview. Written consent included information concerning the practice, financial arrangements, and confidentiality and patients' rights. Since the clinic is not a 24/7 crisis center, mental health emergency resources were shared, and the provider explained MyChart, e-mail, voicemail, and/or other messaging systems should be utilized only for non-emergency reasons. This provider also explained that information obtained during appointments will be placed in [name]'s medical record in a confidential manner and relevant information will be shared with other providers at [facility] that he meets with for coordination of care. Christyan verbally acknowledged understanding of the aforementioned, and agreed to use mental health  emergency resources discussed if needed. Moreover, Yassir agreed information may be shared with other [facility] providers as needed for coordination of care. By signing the service agreement document, Kamarie provided written consent for coordination of care.   Prior to initiating telepsychological services, [name] was provided with an informed consent document, which included the development of a safety plan (i.e., an emergency contact and emergency resources) in the event of an emergency/crisis. [name] expressed understanding of the rationale of the safety plan and provided consent for this provider to reach out to his emergency contact in the event of an emergency/crisis. [name] returned the completed consent form prior to today's appointment. This provider verbally reviewed the consent form during today's appointment prior to proceeding with the appointment. [name] verbally acknowledged understanding that he is ultimately responsible for understanding his insurance benefits as it relates to reimbursement of telepsychological and in-person services. This provider also reviewed confidentiality, as it relates to telepsychological services, as well as the rationale for telepsychological services. More specifically, this provider's clinic is limiting in-person visits due to COVID-19. Therapeutic services will resume to in-person appointments once deemed appropriate. [name] expressed understanding regarding the rationale for telepsychological services. In addition, this provider explained the telepsychological services informed consent document would be considered an addendum to the initial consent document/service agreement. [name] verbally consented to proceed.   Chief Complaint/HPI: [name] was initially referred by [name] due to binge eating. Per the note for the visit with [name] on November 26, 2018, "[name] notes episodes of binge eating almost daily. He has a history of eating 1/2  gallon of ice cream at a time. He has a perfectionist personality and is often swinging from very resistive eating to overindulging." [name] canceled his initial appointment with this provider scheduled on December 11, 2018  due to COVID-19. He requested to meet with this provider via [employer] on April 01, 2019. During the initial appointment with [name] at [facility] on June 18, 2017, [name] reported experiencing the following: significant food cravings issues , snacking frequently in the evenings, frequently eating larger portions than normal , struggling with emotional eating and skipping meals frequently.   During today's appointment, [name] was verbally administered a questionnaire assessing various behaviors related to emotional eating. [name] endorsed the following: overeat when you are celebrating, experience food cravings on a regular basis, eat certain foods when you are anxious, stressed, depressed, or your feelings are hurt, use food to help you cope with emotional situations, find food is comforting to you, overeat when you are angry or upset, overeat when you are worried about something, overeat frequently when you are bored or lonely, not worry about what you eat when you are in a good mood, overeat when you are alone, but eat much less when you are with other people and eat as a reward. [name] reported the onset of emotional eating was likely when he started working and he described having "stressful jobs" his entire life. He shared he craves ice cream. Currently, [name] described the frequency of emotional eating as "every day." He added, "For the first time in my life I do not like the politics." In the past 5 years, [name] further discussed various medical concerns contributing to chronic pain. He explained that there is a current limitation to exercising due to his medical concerns. Thus, he discussed using food to cope. He discussed binge eating and noted, "I can eat 4 to 5  pints [referring to ice cream] a day." He reported in the past he could consume 1/2 a gallon of ice cream a day. Currently, Rice reported a reduction in binge eating, but could not identify the frequency. Reinhold denied a history of restricting food intake, purging and engagement in other compensatory strategies, and has never been diagnosed with an eating disorder. He also denied a history of treatment for emotional eating. Moreover, Quantay indicated stress triggers emotional eating, whereas exercise makes emotional eating better.   Mental Status Examination:  Appearance: neat Behavior: cooperative Mood: euthymic Affect: mood congruent Speech: normal in rate, volume, and tone Eye Contact: appropriate Psychomotor Activity: appropriate Thought Process: linear, logical, and goal directed  Content/Perceptual Disturbances: denies suicidal and homicidal ideation, plan, and intent and no hallucinations, delusions, bizarre thinking or behavior reported or observed Orientation: time, person, place and purpose of appointment Cognition/Sensorium: memory, attention, language, and fund of knowledge intact  Insight: good Judgment: good  Family & Psychosocial History: Badr reported he is married and has one daughter (age 56). He added he has two granddaughters. He indicated he retired 7 years ago due to osteoarthritis and discussed working in [employer] prior to retirement. He discussed traveling frequently for work. Additionally, Braydyn shared his highest level of education obtained is a bachelor's degree. He indicated he almost completed a master's degree. Currently, Lazarus's social support system consists of her wife and daughter. Moreover, Jayson stated he resides with her wife.   Medical History:  Past Medical History:  Diagnosis Date   ALLERGIC RHINITIS 04/22/2007   Allergy    Cancer ([place])    HX SKIN CANCER   Complication of anesthesia    more concerned about positioning of head & neck because of  TMJ   DDD (degenerative disc disease), cervical    GERD (gastroesophageal reflux disease)  infrequently   Headache(784.0)    migraines   History of kidney stones    History of skin cancer    History of TMJ disorder    HYPERCHOLESTEROLEMIA 04/22/2007   HYPERLIPIDEMIA 07/29/2007   Hyperplastic colon polyp    NEPHROLITHIASIS, HX OF 07/29/2007   Osteoarth NOS-Unspec 04/22/2007   Shortness of breath    Shoulder pain, acute    bilateral   Past Surgical History:  Procedure Laterality Date   APPENDECTOMY     BACK SURGERY  10/07/2015   COLONOSCOPY     10 yrs ago- 2 HPP    COLONOSCOPY  2019   JOINT REPLACEMENT     KNEE ARTHROSCOPY     right x2   SHOULDER SURGERY     right   skin cancer biopsy     L forehead   TOTAL KNEE ARTHROPLASTY Right 08/25/2013   Procedure: RIGHT TOTAL KNEE ARTHROPLASTY;  Surgeon: Gearlean Alf, MD;  Location: [facility];  Service: Orthopedics;  Laterality: Right;   TOTAL SHOULDER ARTHROPLASTY Left 12/25/2013   DR SUPPLE    TOTAL SHOULDER ARTHROPLASTY Left 12/25/2013   Procedure: LEFT TOTAL SHOULDER ARTHROPLASTY;  Surgeon: Marin Shutter, MD;  Location: [facility];  Service: Orthopedics;  Laterality: Left;   TOTAL SHOULDER ARTHROPLASTY Right 06/18/2014   DR SUPPLE   TOTAL SHOULDER ARTHROPLASTY Right 06/18/2014   Procedure: RIGHT TOTAL SHOULDER ARTHROPLASTY;  Surgeon: Marin Shutter, MD;  Location: [facility];  Service: Orthopedics;  Laterality: Right;   WISDOM TOOTH EXTRACTION     Current Outpatient Medications on File Prior to Visit  Medication Sig Dispense Refill   allopurinol (ZYLOPRIM) 100 MG tablet TAKE 1 TABLET EVERY DAY 90 tablet 3   amitriptyline (ELAVIL) 100 MG tablet Take 1 tablet (100 mg total) by mouth at bedtime. 90 tablet 4   aspirin 81 MG tablet Take 81 mg by mouth daily.     benzonatate (TESSALON) 100 MG capsule Take 1 capsule (100 mg total) by mouth 2 (two) times daily as needed for cough. 20 capsule 1   Cholecalciferol  (VITAMIN D PO) Take 1 tablet by mouth daily.     Coenzyme Q10 (CO Q 10 PO) Take by mouth daily.     Cyanocobalamin (VITAMIN B 12 PO) Take by mouth.     dextromethorphan 15 MG/5ML syrup Take 10 mLs (30 mg total) by mouth 3 (three) times daily as needed for cough. 120 mL 0   divalproex (DEPAKOTE) 250 MG DR tablet Take 1 tablet (250 mg total) by mouth 2 (two) times daily. 180 tablet 6   DULoxetine (CYMBALTA) 60 MG capsule Take 1 capsule (60 mg total) by mouth daily. 90 capsule 4   EPINEPHrine 0.3 mg/0.3 mL IJ SOAJ injection INJECT 0.3 MLS INTO THE MUSCLE ONCE 2 mL 2   Erenumab-aooe (AIMOVIG) 140 MG/ML SOAJ Inject 140 mg into the skin every 30 (thirty) days. 4 pen 0   gabapentin (NEURONTIN) 300 MG capsule Take 1 capsule (300 mg total) by mouth 2 (two) times daily. 180 capsule 4   Gabapentin, Once-Daily, (GRALISE) 300 MG TABS Take 300 mg by mouth 2 (two) times daily. 60 tablet 11   Multiple Vitamin (MULTIVITAMIN) tablet Take 1 tablet by mouth daily.     naproxen (NAPROSYN) 500 MG tablet Take 500 mg by mouth 2 (two) times daily with a meal.     Omega-3 Fatty Acids (FISH OIL) 1000 MG CAPS Take 1 capsule by mouth daily.     pravastatin (PRAVACHOL) 40  MG tablet TAKE 1 TABLET EVERY DAY 90 tablet 3   Psyllium (METAMUCIL PO) Take 2 tablets by mouth 2 (two) times daily.     Red Yeast Rice Extract (RED YEAST RICE PO) Take 1 tablet by mouth daily.     rizatriptan (MAXALT) 10 MG tablet Take 1 tablet (10 mg total) by mouth as needed for migraine. May repeat in 2 hours if needed 30 tablet 4   SUMAtriptan (IMITREX) 100 MG tablet Take 1 tablet by mouth once as needed for migraine. May repeat in 2 hours if needed. Max 2 per 24 hours. 27 tablet 0   tiZANidine (ZANAFLEX) 4 MG tablet Take 1 tablet (4 mg total) by mouth 3 (three) times daily as needed for muscle spasms. 270 tablet 4   TURMERIC CURCUMIN PO Take 1,050 mg by mouth daily.     valACYclovir (VALTREX) 500 MG tablet Take 500 mg by mouth  daily.     vitamin C (ASCORBIC ACID) 500 MG tablet Take 500 mg by mouth daily.     vitamin E 1000 UNIT capsule Take 1,000 Units by mouth daily.     Current Facility-Administered Medications on File Prior to Visit  Medication Dose Route Frequency Provider Last Rate Last Dose   0.9 %  sodium chloride infusion  500 mL Intravenous Once Ladene Artist, MD      [name] [name] denied a history of head injuries and loss of consciousness.   Mental Health History: Bentlee denied a history of therapeutic services. Brison denied a history of hospitalizations for psychiatric concerns, and has never met with a psychiatrist. Harald shared he is not taking Depakote, which was prescribed by his neurologist, Dr. Jaynee Eagles. His current psychotropics are prescribed by Dr. Jaynee Eagles for migraines. Camarion denied a family history of mental health related concerns. Shahiem denied a trauma history, including psychological, physical  and sexual abuse, as well as neglect.   Gurjot described his typical mood as "aggressive, assertive, not as calm as I should be, not as happy as I should be." Aside from concerns noted above and endorsed on the PHQ-9 and GAD-7, Davidreported experiencing decreased motivation, anger regarding current events and the pandemic, and decreased self-image. Ajai denied current alcohol use. He denied tobacco use. He denied illicit/recreational substance use. Regarding caffeine intake, Ancel reported consuming 2 cups of coffee daily. Furthermore, [name] [name] denied experiencing the following: hopelessness, hallucinations and delusions, paranoia and mania. He also denied history of and current suicidal ideation, plan, and intent; history of and current homicidal ideation, plan, and intent; and history of and current engagement in self-harm.  The following strengths were reported by [name] [name]: driven, goal oriented, people person, independent, and supportive The following strengths were observed by this provider: ability to express  thoughts and feelings during the therapeutic session, ability to establish and benefit from a therapeutic relationship, ability to learn and practice coping skills, willingness to work toward established goal(s) with the clinic and ability to engage in reciprocal conversation.  Legal History: Laine denied a history of legal involvement.   Structured Assessment Results: The Patient Health Questionnaire-9 (PHQ-9) is a self-report measure that assesses symptoms and severity of depression over the course of the last two weeks. Isidro obtained a score of 4 suggesting minimal depression. Itzael finds the endorsed symptoms to be somewhat difficult. Little interest or pleasure in doing things 0  Feeling down, depressed, or hopeless 0  Trouble falling or staying asleep, or sleeping too much 0  Feeling tired or having little energy 3  Poor appetite or overeating 1  Feeling bad about yourself --- or that you are a failure or have let yourself or your family down 0  Trouble concentrating on things, such as reading the newspaper or watching television 0  Moving or speaking so slowly that other people could have noticed? Or the opposite --- being so fidgety or restless that you have been moving around a lot more than usual 0  Thoughts that you would be better off dead or hurting yourself in some way 0  PHQ-9 Score 4    The Generalized Anxiety Disorder-7 (GAD-7) is a brief self-report measure that assesses symptoms of anxiety over the course of the last two weeks. Ramari obtained a score of 2 suggesting minimal anxiety. Kindred finds the endorsed symptoms to be somewhat difficult. Feeling nervous, anxious, on edge 1  Not being able to stop or control worrying 0  Worrying too much about different things 0  Trouble relaxing 0  Being so restless that it's hard to sit still 0  Becoming easily annoyed or irritable 1  Feeling afraid as if something awful might happen 0  GAD-7 Score 2   Interventions: A chart review  was conducted prior to the clinical intake interview. The PHQ-9, and GAD-7 were verbally administered as well as a Mood and Food questionnaire to assess various behaviors related to emotional eating. Throughout session, empathic reflections and validation was provided. Continuing treatment with this provider was discussed and a treatment goal was established. Psychoeducation regarding the importance of reducing news consumption to assist with improving mood was provided. Narvel was agreeable and a plan was developed. In addition, psychoeducation regarding emotional versus physical hunger was provided. Islam was sent a handout via e-mail  to utilize between now and the next appointment to increase awareness of hunger patterns and subsequent eating. [name] [name] provided verbal consent during today's appointment for this provider to send the handout via e-mail.  Provisional DSM-5 Diagnosis: 311 (F32.8) Other Specified Depressive Disorder, Emotional Eating Behaviors  Plan: Sanjeev appears able and willing to participate as evidenced by collaboration on a treatment goal, engagement in reciprocal conversation, and asking questions as needed for clarification. The next appointment will be scheduled in two weeks, which will be via [employer]. The following treatment goal was established: decrease emotional eating. Once this provider's office resumes in-person appointments and it is deemed appropriate, Jozsef will be notified. For the aforementioned goal, Jacobi can benefit from biweekly individual therapy sessions that are brief in duration for approximately four to six sessions. The treatment modality will be individual therapeutic services, including an eclectic therapeutic approach utilizing techniques from Cognitive Behavioral Therapy, Patient Centered Therapy, Dialectical Behavior Therapy, Acceptance and Commitment Therapy, Interpersonal Therapy, and Cognitive Restructuring. Therapeutic approach will include various  interventions as appropriate, such as validation, support, mindfulness, thought defusion, reframing, psychoeducation, values assessment, and role playing. This provider will regularly review the treatment plan and medical chart to keep informed of status changes. Kiam expressed understanding and agreement with the initial treatment plan of care.

## 2019-04-16 ENCOUNTER — Encounter (INDEPENDENT_AMBULATORY_CARE_PROVIDER_SITE_OTHER): Payer: Self-pay | Admitting: Family Medicine

## 2019-04-16 ENCOUNTER — Telehealth (INDEPENDENT_AMBULATORY_CARE_PROVIDER_SITE_OTHER): Payer: Medicare HMO | Admitting: Family Medicine

## 2019-04-16 ENCOUNTER — Other Ambulatory Visit: Payer: Self-pay

## 2019-04-16 DIAGNOSIS — E669 Obesity, unspecified: Secondary | ICD-10-CM | POA: Diagnosis not present

## 2019-04-16 DIAGNOSIS — R632 Polyphagia: Secondary | ICD-10-CM | POA: Diagnosis not present

## 2019-04-16 DIAGNOSIS — Z6834 Body mass index (BMI) 34.0-34.9, adult: Secondary | ICD-10-CM | POA: Diagnosis not present

## 2019-04-16 DIAGNOSIS — F418 Other specified anxiety disorders: Secondary | ICD-10-CM

## 2019-04-17 ENCOUNTER — Telehealth (INDEPENDENT_AMBULATORY_CARE_PROVIDER_SITE_OTHER): Payer: Self-pay | Admitting: Psychology

## 2019-04-17 ENCOUNTER — Telehealth (INDEPENDENT_AMBULATORY_CARE_PROVIDER_SITE_OTHER): Payer: Self-pay | Admitting: Family Medicine

## 2019-04-17 ENCOUNTER — Encounter (INDEPENDENT_AMBULATORY_CARE_PROVIDER_SITE_OTHER): Payer: Self-pay

## 2019-04-17 NOTE — Telephone Encounter (Signed)
Sent meal plan through my chart. [name] [name]

## 2019-04-17 NOTE — Progress Notes (Signed)
Office: [phone]  /  Fax: [phone] TeleHealth Visit:  [name] [name] has verbally consented to this TeleHealth visit today. The patient is located at home, the provider is located at the [employer] [facility] office. The participants in this visit include the listed provider and patient. The visit was conducted today via FaceTime.  HPI:   Chief Complaint: OBESITY [name] [name] is here to discuss his progress with his obesity treatment plan. He is on the Category 3 plan and is following his eating plan approximately 50% of the time. He states he is walking 60 minutes 3 times per week. [name] [name]'s last in-office visit was 4 months ago. He states he weighs 244 lbs today and has gained 14 lbs since then. He has decreased exercise due to increased migraines secondary to stopping his migraine medications. We were unable to weigh the patient today for this TeleHealth visit. He feels as if he has gained 15 lbs since his last visit. He has lost 0 lbs since starting treatment with [name].  Depression with Anxiety [name] [name] is now seeing Dr. Mallie Mussel for therapy. He feels increased anxiety about COVID-19 and politics; has been watching the news 3-4 hours a day and becomes angry and anxious. He doesn't want to take medications to help. He shows no sign of suicidal or homicidal ideations.  Depression screen [facility] 2/9 07/12/2017 06/18/2017 08/12/2014  Decreased Interest 0 3 0  Down, Depressed, Hopeless 0 1 0  PHQ - 2 Score 0 4 0  Altered sleeping - 0 -  Tired, decreased energy - 3 -  Change in appetite - 1 -  Feeling bad or failure about yourself  - 0 -  Trouble concentrating - 0 -  Moving slowly or fidgety/restless - 0 -  Suicidal thoughts - 0 -  PHQ-9 Score - 8 -  Some recent data might be hidden   Binge Eating Disorder (BED) [name] [name] has gone back to binge eating and eating portions of food that are 3-5 times the normal portions. He does this with healthy foods as well as unhealthy foods and has gone back to  gaining weight.  ASSESSMENT AND PLAN:  Depression with anxiety  Binge eating  Class 1 obesity with serious comorbidity and body mass index (BMI) of 34.0 to 34.9 in adult, unspecified obesity type  PLAN:  Depression with Anxiety We discussed behavior modification techniques today to help [name] [name] deal with his depression. He will continue with Dr. Mallie Mussel and will follow-up with [name] as directed to monitor his progress.  Binge Eating Disorder (BED) [name] [name] was offered advice on binge eating. He will continue to work with Dr. Mallie Mussel for therapy.  I spent > than 50% of the 25 minute visit on counseling as documented in the note.  Obesity [name] [name] is currently in the action stage of change. As such, his goal is to continue with weight loss efforts. He has agreed to follow the Category 3 plan. [name] [name] has been instructed to work up to a goal of 150 minutes of combined cardio and strengthening exercise per week for weight loss and overall health benefits. We discussed the following Behavioral Modification Strategies today: work on meal planning and easy cooking plans, ways to avoid boredom eating, emotional eating strategies, and decrease junk food.  [name] [name] has agreed to follow-up with our clinic in 2 weeks. He was informed of the importance of frequent follow-up visits to maximize his success with intensive lifestyle modifications for his multiple health conditions.  ALLERGIES: Allergies  Allergen Reactions  .  Shrimp [Shellfish Allergy] Anaphylaxis and Rash  . Codeine Rash and Other (See Comments)    Blacked out  . Contrast Media [Iodinated Diagnostic Agents] Rash  . Diazepam Rash    Rash over whole body from neck down, and he lost his voice (airway compromise?)  . Amoxicillin Other (See Comments)    Unknown   . Benadryl [Diphenhydramine]     With dyes   . Depakote [Divalproex Sodium] Swelling    Per pts report (given by Dr. Inda Merlin).  Has throat swelling and rash.  . Erythromycin Other  (See Comments)    unknown  . Flexeril [Cyclobenzaprine]   . Allegra [Fexofenadine] Rash       . Betadine [Povidone Iodine] Rash  . Celebrex [Celecoxib] Rash  . Meperidine And Related Rash  . Methocarbamol Rash  . Neosporin [Neomycin-Bacitracin Zn-Polymyx] Rash  . Oxycodone Rash  . Penicillins Rash  . Plavix [Clopidogrel] Rash  . Rivaroxaban Rash  . Vioxx [Rofecoxib] Rash    MEDICATIONS: Current Outpatient Medications on File Prior to Visit  Medication Sig Dispense Refill  . allopurinol (ZYLOPRIM) 100 MG tablet TAKE 1 TABLET EVERY DAY 90 tablet 3  . amitriptyline (ELAVIL) 100 MG tablet Take 1 tablet (100 mg total) by mouth at bedtime. 90 tablet 4  . aspirin 81 MG tablet Take 81 mg by mouth daily.    . benzonatate (TESSALON) 100 MG capsule Take 1 capsule (100 mg total) by mouth 2 (two) times daily as needed for cough. 20 capsule 1  . Cholecalciferol (VITAMIN D PO) Take 1 tablet by mouth daily.    . Coenzyme Q10 (CO Q 10 PO) Take by mouth daily.    . Cyanocobalamin (VITAMIN B 12 PO) Take by mouth.    . dextromethorphan 15 MG/5ML syrup Take 10 mLs (30 mg total) by mouth 3 (three) times daily as needed for cough. 120 mL 0  . divalproex (DEPAKOTE) 250 MG DR tablet Take 1 tablet (250 mg total) by mouth 2 (two) times daily. 180 tablet 6  . DULoxetine (CYMBALTA) 60 MG capsule Take 1 capsule (60 mg total) by mouth daily. 90 capsule 4  . EPINEPHrine 0.3 mg/0.3 mL IJ SOAJ injection INJECT 0.3 MLS INTO THE MUSCLE ONCE 2 mL 2  . Erenumab-aooe (AIMOVIG) 140 MG/ML SOAJ Inject 140 mg into the skin every 30 (thirty) days. 4 pen 0  . gabapentin (NEURONTIN) 300 MG capsule Take 1 capsule (300 mg total) by mouth 2 (two) times daily. 180 capsule 4  . Gabapentin, Once-Daily, (GRALISE) 300 MG TABS Take 300 mg by mouth 2 (two) times daily. 60 tablet 11  . Multiple Vitamin (MULTIVITAMIN) tablet Take 1 tablet by mouth daily.    . naproxen (NAPROSYN) 500 MG tablet Take 500 mg by mouth 2 (two) times daily with  a meal.    . Omega-3 Fatty Acids (FISH OIL) 1000 MG CAPS Take 1 capsule by mouth daily.    . pravastatin (PRAVACHOL) 40 MG tablet TAKE 1 TABLET EVERY DAY 90 tablet 3  . Psyllium (METAMUCIL PO) Take 2 tablets by mouth 2 (two) times daily.    . Red Yeast Rice Extract (RED YEAST RICE PO) Take 1 tablet by mouth daily.    . rizatriptan (MAXALT) 10 MG tablet Take 1 tablet (10 mg total) by mouth as needed for migraine. May repeat in 2 hours if needed 30 tablet 4  . SUMAtriptan (IMITREX) 100 MG tablet Take 1 tablet by mouth once as needed for migraine. May repeat in 2  hours if needed. Max 2 per 24 hours. 27 tablet 0  . tiZANidine (ZANAFLEX) 4 MG tablet Take 1 tablet (4 mg total) by mouth 3 (three) times daily as needed for muscle spasms. 270 tablet 4  . TURMERIC CURCUMIN PO Take 1,050 mg by mouth daily.    . valACYclovir (VALTREX) 500 MG tablet Take 500 mg by mouth daily.    . vitamin C (ASCORBIC ACID) 500 MG tablet Take 500 mg by mouth daily.    . vitamin E 1000 UNIT capsule Take 1,000 Units by mouth daily.     Current Facility-Administered Medications on File Prior to Visit  Medication Dose Route Frequency Provider Last Rate Last Dose  . 0.9 %  sodium chloride infusion  500 mL Intravenous Once Ladene Artist, MD        PAST MEDICAL HISTORY: Past Medical History:  Diagnosis Date  . ALLERGIC RHINITIS 04/22/2007  . Allergy   . Cancer (HCC)    HX SKIN CANCER  . Complication of anesthesia    more concerned about positioning of head & neck because of TMJ  . DDD (degenerative disc disease), cervical   . GERD (gastroesophageal reflux disease)    infrequently  . Headache(784.0)    migraines  . History of kidney stones   . History of skin cancer   . History of TMJ disorder   . HYPERCHOLESTEROLEMIA 04/22/2007  . HYPERLIPIDEMIA 07/29/2007  . Hyperplastic colon polyp   . NEPHROLITHIASIS, HX OF 07/29/2007  . Osteoarth NOS-Unspec 04/22/2007  . Shortness of breath   . Shoulder pain, acute     bilateral    PAST SURGICAL HISTORY: Past Surgical History:  Procedure Laterality Date  . APPENDECTOMY    . BACK SURGERY  10/07/2015  . COLONOSCOPY     10 yrs ago- 2 HPP   . COLONOSCOPY  2019  . JOINT REPLACEMENT    . KNEE ARTHROSCOPY     right x2  . SHOULDER SURGERY     right  . skin cancer biopsy     L forehead  . TOTAL KNEE ARTHROPLASTY Right 08/25/2013   Procedure: RIGHT TOTAL KNEE ARTHROPLASTY;  Surgeon: Gearlean Alf, MD;  Location: [facility];  Service: Orthopedics;  Laterality: Right;  . TOTAL SHOULDER ARTHROPLASTY Left 12/25/2013   DR SUPPLE   . TOTAL SHOULDER ARTHROPLASTY Left 12/25/2013   Procedure: LEFT TOTAL SHOULDER ARTHROPLASTY;  Surgeon: Marin Shutter, MD;  Location: [place];  Service: Orthopedics;  Laterality: Left;  . TOTAL SHOULDER ARTHROPLASTY Right 06/18/2014   DR SUPPLE  . TOTAL SHOULDER ARTHROPLASTY Right 06/18/2014   Procedure: RIGHT TOTAL SHOULDER ARTHROPLASTY;  Surgeon: Marin Shutter, MD;  Location: [place];  Service: Orthopedics;  Laterality: Right;  . WISDOM TOOTH EXTRACTION      SOCIAL HISTORY: Social History   Tobacco Use  . Smoking status: Never Smoker  . Smokeless tobacco: Never Used  Substance Use Topics  . Alcohol use: No    Alcohol/week: 0.0 standard drinks  . Drug use: No    FAMILY HISTORY: Family History  Problem Relation Age of Onset  . COPD Father        family hx  . Emphysema Father   . Aneurysm Mother   . Stroke Mother   . Sudden death Mother   . Cancer Mother   . Obesity Mother   . Breast cancer Mother   . Cancer Maternal Grandfather        lung  . Colon cancer Neg Hx   .  Colon polyps Neg Hx    ROS: Review of Systems  Psychiatric/Behavioral: Positive for depression. Negative for suicidal ideas. The patient is nervous/anxious.        Negative for homicidal ideas. Positive for binge eating disorder.   PHYSICAL EXAM: Pt in no acute distress  RECENT LABS AND TESTS: BMET    Component Value Date/Time   NA 140  11/21/2017 0819   K 4.4 11/21/2017 0819   CL 103 11/21/2017 0819   CO2 21 11/21/2017 0819   GLUCOSE 93 11/21/2017 0819   GLUCOSE 94 07/31/2014 0920   GLUCOSE 94 08/20/2006 0830   BUN 25 11/21/2017 0819   CREATININE 1.14 11/21/2017 0819   CALCIUM 9.3 11/21/2017 0819   GFRNONAA 67 11/21/2017 0819   GFRAA 77 11/21/2017 0819   Lab Results  Component Value Date   HGBA1C 5.5 11/21/2017   HGBA1C 5.8 (H) 06/18/2017   Lab Results  Component Value Date   INSULIN 8.6 11/21/2017   INSULIN 16.5 06/18/2017   CBC    Component Value Date/Time   WBC 5.5 06/18/2017 1047   WBC 5.7 07/31/2014 0920   RBC 4.60 06/18/2017 1047   RBC 4.53 07/31/2014 0920   HGB 15.7 06/18/2017 1047   HCT 45.0 06/18/2017 1047   PLT 313.0 07/31/2014 0920   MCV 98 (H) 06/18/2017 1047   MCH 34.1 (H) 06/18/2017 1047   MCH 31.8 06/10/2014 0955   MCHC 34.9 06/18/2017 1047   MCHC 33.4 07/31/2014 0920   RDW 14.3 06/18/2017 1047   LYMPHSABS 1.8 06/18/2017 1047   MONOABS 0.5 07/31/2014 0920   EOSABS 0.3 06/18/2017 1047   BASOSABS 0.1 06/18/2017 1047   Iron/TIBC/Ferritin/ %Sat No results found for: IRON, TIBC, FERRITIN, IRONPCTSAT Lipid Panel     Component Value Date/Time   CHOL 239 (H) 11/21/2017 0819   TRIG 139 11/21/2017 0819   TRIG 52 08/20/2006 0830   HDL 62 11/21/2017 0819   CHOLHDL 4 07/31/2014 0920   VLDL 24.4 07/31/2014 0920   LDLCALC 149 (H) 11/21/2017 0819   LDLDIRECT 135.0 07/16/2013 0937   Hepatic Function Panel     Component Value Date/Time   PROT 7.0 11/21/2017 0819   ALBUMIN 4.3 11/21/2017 0819   AST 40 11/21/2017 0819   ALT 40 11/21/2017 0819   ALKPHOS 46 11/21/2017 0819   BILITOT 0.5 11/21/2017 0819   BILIDIR 0.1 07/31/2014 0920      Component Value Date/Time   TSH 2.920 06/18/2017 1047   TSH 1.53 07/31/2014 0920   TSH 1.85 07/16/2013 0937   Results for Mostek, [name] S "DAVE" (MRN [id number]) as of 04/17/2019 10:12  Ref. Range 11/21/2017 08:19  Vitamin D, 25-Hydroxy Latest Ref  Range: 30.0 - 100.0 ng/mL 59.6   I, Michaelene Song, am acting as Location manager for Dennard Nip, MD I have reviewed the above documentation for accuracy and completeness, and I agree with the above. -Dennard Nip, MD

## 2019-04-17 NOTE — Telephone Encounter (Signed)
Pt called and stated he was informed that [name] would send him a diet via email and he hasn't received it yet.

## 2019-04-17 NOTE — Telephone Encounter (Signed)
Pt called and stated he was told he would receive an exercise form via email from Dr. Mallie Mussel and he hasn't received it yet.  He would like to get started with this exercise asap.

## 2019-04-29 ENCOUNTER — Encounter (INDEPENDENT_AMBULATORY_CARE_PROVIDER_SITE_OTHER): Payer: Self-pay | Admitting: Family Medicine

## 2019-04-29 NOTE — Progress Notes (Signed)
Office: [phone]  /  Fax: [phone]    Date: April 30, 2019   Appointment Start Time: 10:00am Duration: 32 minutes Provider: Glennie Isle, Psy.D. Type of Session: Individual Therapy  Location of Patient: Home Location of Provider: [facility] Type of Contact: Telepsychological Visit via [employer] WebEx   Session Content: [name] [name] is a 68 y.o. male presenting via [place] for a follow-up appointment to address the previously established treatment goal of decreasing emotional eating. Today's appointment was a telepsychological visit, as this provider's clinic is seeing a limited number of patients for in-person visits due to COVID-19. Therapeutic services will resume to in-person appointments once deemed appropriate. Hercules expressed understanding regarding the rationale for telepsychological services, and provided verbal consent for today's appointment. Prior to proceeding with today's appointment, [name]'s physical location at the time of this appointment was obtained. [name] reported he was at home and provided the address. In the event of technical difficulties, [name] shared a phone number he could be reached at. [name] and this provider participated in today's telepsychological service. Also, Briscoe denied anyone else being present in the room or on the WebEx appointment.  This provider conducted a brief check-in and verbally administered the PHQ-9 and GAD-7. [name] shared about recent stressors, including a "roof replacement." Regarding the goal of reducing news consumption, [name] shared he met his goal. This was positively reinforced. He  also reflected on the differences he observed in mood when he reduced news consumption. [name] also acknowledged eating mindlessly while watching the news. Moreover, [name] shared he did not eat any ice cream since the last appointment with this provider and also shared walking "12 out of the 14 days." Moreover, psychoeducation regarding  triggers for emotional eating was provided. [name] was provided a handout, and encouraged to utilize the handout between now and the next appointment to increase awareness of triggers and frequency. [name] agreed. This provider also discussed behavioral strategies for specific triggers, such as placing the utensil down when conversing to avoid mindless eating. [name] provided verbal consent during today's appointment for this provider to send the handout for triggers via e-mail. Curby was receptive to today's session as evidenced by openness to sharing, responsiveness to feedback, and willingness to explore triggers.   Mental Status Examination:  Appearance: neat Behavior: cooperative Mood: sad Affect: mood congruent Speech: normal in rate, volume, and tone Eye Contact: appropriate Psychomotor Activity: appropriate Thought Process: linear, logical, and goal directed  Content/Perceptual Disturbances: denies suicidal and homicidal ideation, plan, and intent and no hallucinations, delusions, bizarre thinking or behavior reported or observed Orientation: time, person, place and purpose of appointment Cognition/Sensorium: memory, attention, language, and fund of knowledge intact  Insight: good Judgment: good  Structured Assessment Results: The Patient Health Questionnaire-9 (PHQ-9) is a self-report measure that assesses symptoms and severity of depression over the course of the last two weeks. [name] obtained a score of 8 suggesting mild depression. [name] finds the endorsed symptoms to be somewhat difficult. Little interest or pleasure in doing things 0  Feeling down, depressed, or hopeless 3  Trouble falling or staying asleep, or sleeping too much 0  Feeling tired or having little energy 1  Poor appetite or overeating 2  Feeling bad about yourself --- or that you are a failure or have let yourself or your family down 2  Trouble concentrating on things, such as reading the newspaper or watching  television 0  Moving or speaking so slowly that other people could have noticed? Or the  opposite --- being so fidgety or restless that you have been moving around a lot more than usual 0  Thoughts that you would be better off dead or hurting yourself in some way 0  PHQ-9 Score 8    The Generalized Anxiety Disorder-7 (GAD-7) is a brief self-report measure that assesses symptoms of anxiety over the course of the last two weeks. Maciah obtained a score of 2 suggesting minimal anxiety. Diyari finds the endorsed symptoms to be somewhat difficult. Feeling nervous, anxious, on edge 1  Not being able to stop or control worrying 0  Worrying too much about different things 0  Trouble relaxing 0  Being so restless that it's hard to sit still 0  Becoming easily annoyed or irritable 1  Feeling afraid as if something awful might happen 0  GAD-7 Score 2   Interventions:  Conducted a brief chart review Verbal administration of PHQ-9 and GAD-7 for symptom monitoring Provided empathic reflections and validation Reviewed content from the previous session Psychoeducation provided regarding triggers for emotional eating Provided positive reinforcement Focused on rapport building Employed supportive psychotherapy interventions to facilitate reduced distress, and to improve coping skills with identified stressors  DSM-5 Diagnosis: 311 (F32.8) Other Specified Depressive Disorder, Emotional Eating Behaviors  Treatment Goal & Progress: During the initial appointment with this provider, the following treatment goal was established: decrease emotional eating. Progress is limited, as Bland has just begun treatment with this provider; however, he is receptive to the interaction and interventions and rapport is being established.   Plan: Brianna continues to appear able and willing to participate as evidenced by engagement in reciprocal conversation, and asking questions for clarification as appropriate. Per [name]'s  request, the next appointment will be scheduled in three weeks, which will be via [employer]. Once this provider's office resumes in-person appointments and it is deemed appropriate, Trev will be notified. The next session will focus on reviewing triggers for emotional eating and the introduction of thought defusion.

## 2019-04-30 ENCOUNTER — Other Ambulatory Visit: Payer: Self-pay

## 2019-04-30 ENCOUNTER — Ambulatory Visit (INDEPENDENT_AMBULATORY_CARE_PROVIDER_SITE_OTHER): Payer: Medicare HMO | Admitting: Psychology

## 2019-04-30 DIAGNOSIS — F3289 Other specified depressive episodes: Secondary | ICD-10-CM

## 2019-04-30 NOTE — Telephone Encounter (Signed)
FYI

## 2019-05-01 ENCOUNTER — Telehealth (INDEPENDENT_AMBULATORY_CARE_PROVIDER_SITE_OTHER): Payer: Medicare HMO | Admitting: Family Medicine

## 2019-05-01 ENCOUNTER — Encounter (INDEPENDENT_AMBULATORY_CARE_PROVIDER_SITE_OTHER): Payer: Self-pay | Admitting: Family Medicine

## 2019-05-01 ENCOUNTER — Other Ambulatory Visit: Payer: Self-pay

## 2019-05-01 DIAGNOSIS — R632 Polyphagia: Secondary | ICD-10-CM

## 2019-05-01 DIAGNOSIS — Z6834 Body mass index (BMI) 34.0-34.9, adult: Secondary | ICD-10-CM | POA: Diagnosis not present

## 2019-05-01 DIAGNOSIS — F418 Other specified anxiety disorders: Secondary | ICD-10-CM | POA: Diagnosis not present

## 2019-05-01 DIAGNOSIS — E669 Obesity, unspecified: Secondary | ICD-10-CM | POA: Diagnosis not present

## 2019-05-05 NOTE — Progress Notes (Signed)
Office: [phone]  /  Fax: [phone] TeleHealth Visit:  [name] [name] has verbally consented to this TeleHealth visit today. The patient is located at home, the provider is located at the [employer] [facility] office. The participants in this visit include the listed provider and patient. The visit was conducted today via FaceTime.  HPI:   Chief Complaint: OBESITY [name] [name] is here to discuss his progress with his obesity treatment plan. He is on the Category 3 plan and is following his eating plan approximately 50% of the time. He states he is walking 60 minutes 6 times per week. [name] [name] is trying to get back on track with his eating after binge eating and gaining weight most of the COVID-19 pandemic. He is not really following his Category 3 plan but instead is keeping a very detailed journal. He is still eating too much sugar but is trying to cut back. We were unable to weigh the patient today for this TeleHealth visit. He feels as if he has lost weight since his last visit. He has lost 0 lbs since starting treatment with [name].  Depression with Anxiety and Binge Eating [name] [name] is following with [name] [name] for therapy and is working on his binge eating and all or nothing behaviors. He is losing weight but is not yet happy about it because he is not being "perfect." He denies suicidal or homicidal ideas.  Depression screen [facility] 2/9 07/12/2017 06/18/2017 08/12/2014  Decreased Interest 0 3 0  Down, Depressed, Hopeless 0 1 0  PHQ - 2 Score 0 4 0  Altered sleeping - 0 -  Tired, decreased energy - 3 -  Change in appetite - 1 -  Feeling bad or failure about yourself  - 0 -  Trouble concentrating - 0 -  Moving slowly or fidgety/restless - 0 -  Suicidal thoughts - 0 -  PHQ-9 Score - 8 -  Some recent data might be hidden   ASSESSMENT AND PLAN:  Depression with anxiety  Binge eating  Class 1 obesity with serious comorbidity and body mass index (BMI) of 34.0 to 34.9 in adult, unspecified  obesity type  PLAN:  Depression with Anxiety and Binge Eating [name] [name] was reassured he is on the right track and was encouraged to continue talking with [name] [name] regularly.  I spent > than 50% of the 25 minute visit on counseling as documented in the note.  Obesity [name] [name] is currently in the action stage of change. As such, his goal is to continue with weight loss efforts. He has agreed to keep a food journal with 1800-2000 calories and 100+ grams of protein.  [name] [name] has been instructed to work up to a goal of 150 minutes of combined cardio and strengthening exercise per week for weight loss and overall health benefits. We discussed the following Behavioral Modification Strategies today: decreasing simple carbohydrates, better snacking choices, and emotional eating strategies.  [name] [name] has agreed to follow-up with our clinic in 3 weeks. He was informed of the importance of frequent follow-up visits to maximize his success with intensive lifestyle modifications for his multiple health conditions.  ALLERGIES: Allergies  Allergen Reactions  . Shrimp [Shellfish Allergy] Anaphylaxis and Rash  . Codeine Rash and Other (See Comments)    Blacked out  . Contrast Media [Iodinated Diagnostic Agents] Rash  . Diazepam Rash    Rash over whole body from neck down, and he lost his voice (airway compromise?)  . Amoxicillin Other (See Comments)    Unknown   .  Benadryl [Diphenhydramine]     With dyes   . Depakote [Divalproex Sodium] Swelling    Per pts report (given by Dr. Inda Merlin).  Has throat swelling and rash.  . Erythromycin Other (See Comments)    unknown  . Flexeril [Cyclobenzaprine]   . Allegra [Fexofenadine] Rash       . Betadine [Povidone Iodine] Rash  . Celebrex [Celecoxib] Rash  . Meperidine And Related Rash  . Methocarbamol Rash  . Neosporin [Neomycin-Bacitracin Zn-Polymyx] Rash  . Oxycodone Rash  . Penicillins Rash  . Plavix [Clopidogrel] Rash  . Rivaroxaban Rash  . Vioxx  [Rofecoxib] Rash    MEDICATIONS: Current Outpatient Medications on File Prior to Visit  Medication Sig Dispense Refill  . allopurinol (ZYLOPRIM) 100 MG tablet TAKE 1 TABLET EVERY DAY 90 tablet 3  . amitriptyline (ELAVIL) 100 MG tablet Take 1 tablet (100 mg total) by mouth at bedtime. 90 tablet 4  . aspirin 81 MG tablet Take 81 mg by mouth daily.    . Cholecalciferol (VITAMIN D PO) Take 1 tablet by mouth daily.    . Coenzyme Q10 (CO Q 10 PO) Take by mouth daily.    . Cyanocobalamin (VITAMIN B 12 PO) Take by mouth.    . divalproex (DEPAKOTE) 250 MG DR tablet Take 1 tablet (250 mg total) by mouth 2 (two) times daily. 180 tablet 6  . DULoxetine (CYMBALTA) 60 MG capsule Take 1 capsule (60 mg total) by mouth daily. 90 capsule 4  . EPINEPHrine 0.3 mg/0.3 mL IJ SOAJ injection INJECT 0.3 MLS INTO THE MUSCLE ONCE 2 mL 2  . Erenumab-aooe (AIMOVIG) 140 MG/ML SOAJ Inject 140 mg into the skin every 30 (thirty) days. 4 pen 0  . gabapentin (NEURONTIN) 300 MG capsule Take 1 capsule (300 mg total) by mouth 2 (two) times daily. 180 capsule 4  . Multiple Vitamin (MULTIVITAMIN) tablet Take 1 tablet by mouth daily.    . naproxen (NAPROSYN) 500 MG tablet Take 500 mg by mouth 2 (two) times daily with a meal.    . Omega-3 Fatty Acids (FISH OIL) 1000 MG CAPS Take 1 capsule by mouth daily.    . pravastatin (PRAVACHOL) 40 MG tablet TAKE 1 TABLET EVERY DAY 90 tablet 3  . Psyllium (METAMUCIL PO) Take 2 tablets by mouth 2 (two) times daily.    . Red Yeast Rice Extract (RED YEAST RICE PO) Take 1 tablet by mouth daily.    . SUMAtriptan (IMITREX) 100 MG tablet Take 1 tablet by mouth once as needed for migraine. May repeat in 2 hours if needed. Max 2 per 24 hours. 27 tablet 0  . tiZANidine (ZANAFLEX) 4 MG tablet Take 1 tablet (4 mg total) by mouth 3 (three) times daily as needed for muscle spasms. 270 tablet 4  . TURMERIC CURCUMIN PO Take 1,050 mg by mouth daily.    . valACYclovir (VALTREX) 500 MG tablet Take 500 mg by mouth  daily.    . vitamin C (ASCORBIC ACID) 500 MG tablet Take 500 mg by mouth daily.    . vitamin E 1000 UNIT capsule Take 1,000 Units by mouth daily.     Current Facility-Administered Medications on File Prior to Visit  Medication Dose Route Frequency Provider Last Rate Last Dose  . 0.9 %  sodium chloride infusion  500 mL Intravenous Once Ladene Artist, MD        PAST MEDICAL HISTORY: Past Medical History:  Diagnosis Date  . ALLERGIC RHINITIS 04/22/2007  . Allergy   .  Cancer (HCC)    HX SKIN CANCER  . Complication of anesthesia    more concerned about positioning of head & neck because of TMJ  . DDD (degenerative disc disease), cervical   . GERD (gastroesophageal reflux disease)    infrequently  . Headache(784.0)    migraines  . History of kidney stones   . History of skin cancer   . History of TMJ disorder   . HYPERCHOLESTEROLEMIA 04/22/2007  . HYPERLIPIDEMIA 07/29/2007  . Hyperplastic colon polyp   . NEPHROLITHIASIS, HX OF 07/29/2007  . Osteoarth NOS-Unspec 04/22/2007  . Shortness of breath   . Shoulder pain, acute    bilateral    PAST SURGICAL HISTORY: Past Surgical History:  Procedure Laterality Date  . APPENDECTOMY    . BACK SURGERY  10/07/2015  . COLONOSCOPY     10 yrs ago- 2 HPP   . COLONOSCOPY  2019  . JOINT REPLACEMENT    . KNEE ARTHROSCOPY     right x2  . SHOULDER SURGERY     right  . skin cancer biopsy     L forehead  . TOTAL KNEE ARTHROPLASTY Right 08/25/2013   Procedure: RIGHT TOTAL KNEE ARTHROPLASTY;  Surgeon: Gearlean Alf, MD;  Location: [facility];  Service: Orthopedics;  Laterality: Right;  . TOTAL SHOULDER ARTHROPLASTY Left 12/25/2013   DR SUPPLE   . TOTAL SHOULDER ARTHROPLASTY Left 12/25/2013   Procedure: LEFT TOTAL SHOULDER ARTHROPLASTY;  Surgeon: Marin Shutter, MD;  Location: [facility];  Service: Orthopedics;  Laterality: Left;  . TOTAL SHOULDER ARTHROPLASTY Right 06/18/2014   DR SUPPLE  . TOTAL SHOULDER ARTHROPLASTY Right 06/18/2014   Procedure:  RIGHT TOTAL SHOULDER ARTHROPLASTY;  Surgeon: Marin Shutter, MD;  Location: [facility];  Service: Orthopedics;  Laterality: Right;  . WISDOM TOOTH EXTRACTION      SOCIAL HISTORY: Social History   Tobacco Use  . Smoking status: Never Smoker  . Smokeless tobacco: Never Used  Substance Use Topics  . Alcohol use: No    Alcohol/week: 0.0 standard drinks  . Drug use: No    FAMILY HISTORY: Family History  Problem Relation Age of Onset  . COPD Father        family hx  . Emphysema Father   . Aneurysm Mother   . Stroke Mother   . Sudden death Mother   . Cancer Mother   . Obesity Mother   . Breast cancer Mother   . Cancer Maternal Grandfather        lung  . Colon cancer Neg Hx   . Colon polyps Neg Hx    ROS: Review of Systems  Psychiatric/Behavioral: Positive for depression. Negative for suicidal ideas. The patient is nervous/anxious.        Positive for binge eating. Negative for homicidal ideas.   PHYSICAL EXAM: Pt in no acute distress  RECENT LABS AND TESTS: BMET    Component Value Date/Time   NA 140 11/21/2017 0819   K 4.4 11/21/2017 0819   CL 103 11/21/2017 0819   CO2 21 11/21/2017 0819   GLUCOSE 93 11/21/2017 0819   GLUCOSE 94 07/31/2014 0920   GLUCOSE 94 08/20/2006 0830   BUN 25 11/21/2017 0819   CREATININE 1.14 11/21/2017 0819   CALCIUM 9.3 11/21/2017 0819   GFRNONAA 67 11/21/2017 0819   GFRAA 77 11/21/2017 0819   Lab Results  Component Value Date   HGBA1C 5.5 11/21/2017   HGBA1C 5.8 (H) 06/18/2017   Lab Results  Component Value Date  INSULIN 8.6 11/21/2017   INSULIN 16.5 06/18/2017   CBC    Component Value Date/Time   WBC 5.5 06/18/2017 1047   WBC 5.7 07/31/2014 0920   RBC 4.60 06/18/2017 1047   RBC 4.53 07/31/2014 0920   HGB 15.7 06/18/2017 1047   HCT 45.0 06/18/2017 1047   PLT 313.0 07/31/2014 0920   MCV 98 (H) 06/18/2017 1047   MCH 34.1 (H) 06/18/2017 1047   MCH 31.8 06/10/2014 0955   MCHC 34.9 06/18/2017 1047   MCHC 33.4 07/31/2014 0920    RDW 14.3 06/18/2017 1047   LYMPHSABS 1.8 06/18/2017 1047   MONOABS 0.5 07/31/2014 0920   EOSABS 0.3 06/18/2017 1047   BASOSABS 0.1 06/18/2017 1047   Iron/TIBC/Ferritin/ %Sat No results found for: IRON, TIBC, FERRITIN, IRONPCTSAT Lipid Panel     Component Value Date/Time   CHOL 239 (H) 11/21/2017 0819   TRIG 139 11/21/2017 0819   TRIG 52 08/20/2006 0830   HDL 62 11/21/2017 0819   CHOLHDL 4 07/31/2014 0920   VLDL 24.4 07/31/2014 0920   LDLCALC 149 (H) 11/21/2017 0819   LDLDIRECT 135.0 07/16/2013 0937   Hepatic Function Panel     Component Value Date/Time   PROT 7.0 11/21/2017 0819   ALBUMIN 4.3 11/21/2017 0819   AST 40 11/21/2017 0819   ALT 40 11/21/2017 0819   ALKPHOS 46 11/21/2017 0819   BILITOT 0.5 11/21/2017 0819   BILIDIR 0.1 07/31/2014 0920      Component Value Date/Time   TSH 2.920 06/18/2017 1047   TSH 1.53 07/31/2014 0920   TSH 1.85 07/16/2013 0937   Results for [name] [name], [name] S "DAVE" (MRN [id number]) as of 05/05/2019 16:26  Ref. Range 11/21/2017 08:19  Vitamin D, 25-Hydroxy Latest Ref Range: 30.0 - 100.0 ng/mL 59.6   I, Michaelene Song, am acting as Location manager for Dennard Nip, MD I have reviewed the above documentation for accuracy and completeness, and I agree with the above. -Dennard Nip, MD

## 2019-05-07 ENCOUNTER — Other Ambulatory Visit: Payer: Self-pay | Admitting: Family Medicine

## 2019-05-20 NOTE — Progress Notes (Signed)
Office: [phone]  /  Fax: [phone]    Date: May 21, 2019   Appointment Start Time: 8:30am Duration: 35 minutes Provider: Glennie Isle, Psy.D. Type of Session: Individual Therapy  Location of Patient: Home Location of Provider: [facility] Type of Contact: Telepsychological Visit via [employer] WebEx   Session Content: [name] [name] is a 68 y.o. male presenting via [place] for a follow-up appointment to address the previously established treatment goal of decreasing emotional eating. Today's appointment was a telepsychological visit, as this provider's clinic is seeing a limited number of patients for in-person visits due to COVID-19. Therapeutic services will resume to in-person appointments once deemed appropriate. Statham expressed understanding regarding the rationale for telepsychological services, and provided verbal consent for today's appointment. Prior to proceeding with today's appointment, Jigar's physical location at the time of this appointment was obtained. Siddh reported he was at home and provided the address. In the event of technical difficulties, Rodolph shared a phone number he could be reached at. [name] and this provider participated in today's telepsychological service. Also, Giro denied anyone else being present in the room or on the WebEx appointment.  This provider conducted a brief check-in and verbally administered the PHQ-9 and GAD-7. Jess reported, "I'm good, but I did not lose any weight in the past 3 weeks." He noted a reduction in binge eating and reported, "I didn't eat ice cream." Thierno described making better choices; therefore, described feeling frustrated with not losing weight. He also reported he often walks for long distances resulting in him being "out of commission." Associated thoughts and feelings were processed. Moreover, due to ongoing sleeping difficulties, psychoeducation regarding sleep hygiene was provided, and Ronie was given a  handout. [name] provided verbal consent during today's appointment for this provider to send the handout on sleep hygiene via e-mail. Elbin also shared he stopped watching the news and noted, "It's worked considerably." Currently, he will ask his wife for updates. This provider recommended longer-term therapeutic services due to ongoing stressors and adjustment to medical concerns. Michoel requested time to "think about it" as the therapeutic process is still new for him.  Khamani was receptive to today's session as evidenced by openness to sharing, responsiveness to feedback, and willingness to implement discussed strategies.  Mental Status Examination:  Appearance: neat Behavior: cooperative Mood: euthymic Affect: mood congruent Speech: normal in rate, volume, and tone Eye Contact: appropriate Psychomotor Activity: appropriate Thought Process: linear, logical, and goal directed  Content/Perceptual Disturbances: no hallucinations, delusions, bizarre thinking or behavior reported or observed and no evidence of suicidal and homicidal ideation, plan, and intent Orientation: time, person, place and purpose of appointment Cognition/Sensorium: memory, attention, language, and fund of knowledge intact  Insight: good Judgment: good  Structured Assessment Results: The Patient Health Questionnaire-9 (PHQ-9) is a self-report measure that assesses symptoms and severity of depression over the course of the last two weeks. Detravious obtained a score of 5 suggesting mild depression. Jayvonne finds the endorsed symptoms to be not difficult at all. Little interest or pleasure in doing things 0  Feeling down, depressed, or hopeless 0  Trouble falling or staying asleep, or sleeping too much 3  Feeling tired or having little energy 1  Poor appetite or overeating 1  Feeling bad about yourself --- or that you are a failure or have let yourself or your family down 0  Trouble concentrating on things, such as reading the  newspaper or watching television 0  Moving or speaking so slowly that  other people could have noticed? Or the opposite --- being so fidgety or restless that you have been moving around a lot more than usual 0  Thoughts that you would be better off dead or hurting yourself in some way 0  PHQ-9 Score 5    The Generalized Anxiety Disorder-7 (GAD-7) is a brief self-report measure that assesses symptoms of anxiety over the course of the last two weeks. Heston obtained a score of 1 suggesting minimal anxiety. Tashawn finds the endorsed symptoms to be not difficult at all. Feeling nervous, anxious, on edge 0  Not being able to stop or control worrying 0  Worrying too much about different things 0  Trouble relaxing 0  Being so restless that it's hard to sit still 0  Becoming easily annoyed or irritable 1  Feeling afraid as if something awful might happen 0  GAD-7 Score 1   Interventions:  Conducted a brief chart review Verbal administration of PHQ-9 and GAD-7 for symptom monitoring Provided empathic reflections and validation Reviewed content from the previous session Processed thoughts and feelings Psychoeducation provided regarding sleep hygiene Discussed option for a referral for longer-term therapeutic services Employed supportive psychotherapy interventions to facilitate reduced distress, and to improve coping skills with identified stressors  DSM-5 Diagnosis: 311 (F32.8) Other Specified Depressive Disorder, Emotional Eating Behaviors  Treatment Goal & Progress: During the initial appointment with this provider, the following treatment goal was established: decrease emotional eating. Dalson has demonstrated progress in his goal as evidenced by increased awareness of hunger patterns and triggers for emotional eating.   Plan: Tejay continues to appear able and willing to participate as evidenced by engagement in reciprocal conversation, and asking questions for clarification as appropriate. Per  Torey's request, the next appointment will be scheduled in 3-4 weeks, which will be via [employer]. The next session will focus on thought defusion as it was not introduced today due to Nikalas's presenting concerns.

## 2019-05-21 ENCOUNTER — Encounter (INDEPENDENT_AMBULATORY_CARE_PROVIDER_SITE_OTHER): Payer: Self-pay | Admitting: Family Medicine

## 2019-05-21 ENCOUNTER — Ambulatory Visit (INDEPENDENT_AMBULATORY_CARE_PROVIDER_SITE_OTHER): Payer: Medicare HMO | Admitting: Psychology

## 2019-05-21 ENCOUNTER — Other Ambulatory Visit: Payer: Self-pay

## 2019-05-21 DIAGNOSIS — F3289 Other specified depressive episodes: Secondary | ICD-10-CM

## 2019-05-21 NOTE — Telephone Encounter (Signed)
Please review

## 2019-05-22 ENCOUNTER — Telehealth (INDEPENDENT_AMBULATORY_CARE_PROVIDER_SITE_OTHER): Payer: Medicare HMO | Admitting: Family Medicine

## 2019-05-22 ENCOUNTER — Encounter (INDEPENDENT_AMBULATORY_CARE_PROVIDER_SITE_OTHER): Payer: Self-pay | Admitting: Family Medicine

## 2019-05-22 DIAGNOSIS — R7303 Prediabetes: Secondary | ICD-10-CM

## 2019-05-22 DIAGNOSIS — E669 Obesity, unspecified: Secondary | ICD-10-CM

## 2019-05-22 DIAGNOSIS — E7849 Other hyperlipidemia: Secondary | ICD-10-CM

## 2019-05-22 DIAGNOSIS — Z6834 Body mass index (BMI) 34.0-34.9, adult: Secondary | ICD-10-CM | POA: Diagnosis not present

## 2019-05-22 NOTE — Telephone Encounter (Signed)
FYI

## 2019-05-26 NOTE — Progress Notes (Signed)
Office: [phone]  /  Fax: [phone] TeleHealth Visit:  [name] [name] has verbally consented to this TeleHealth visit today. The patient is located at home, the provider is located at the [facility] office. The participants in this visit include the listed provider and patient. The visit was conducted today via FaceTime.  HPI:   Chief Complaint: OBESITY [name] [name] is here to discuss his progress with his obesity treatment plan. He is on the Category 3 plan and is following his eating plan approximately 80% of the time. He states he is walking 4 miles a day 60 minutes 4-5 times per week. [name] [name] is journaling daily. He is frustrated he isn't losing weight but he also is not keeping his calories below 2000 a day. His sleep is 5-6 hours a night, but he is walking most days an hour or more. We were unable to weigh the patient today for this TeleHealth visit. He feels as if he has gained 1 lb since his last visit. He has lost 0 lbs since starting treatment with [name].  Pre-Diabetes [name] [name] has a diagnosis of prediabetes based on his elevated Hgb A1c and was informed this puts him at greater risk of developing diabetes. He is not taking metformin currently and continues to work on diet but has gained weight. He is due for labs. He reports polyphagia.  Hyperlipidemia [name] [name] has hyperlipidemia and has been attempting to control his cholesterol levels with diet. He denies any chest pain or myalgias. He is due for labs.  ASSESSMENT AND PLAN:  Prediabetes  Other hyperlipidemia  Class 1 obesity with serious comorbidity and body mass index (BMI) of 34.0 to 34.9 in adult, unspecified obesity type  PLAN:  Pre-Diabetes [name] [name] will continue to work on weight loss, exercise, and decreasing simple carbohydrates in his diet to help decrease the risk of diabetes. We dicussed metformin including benefits and risks. He was informed that eating too many simple carbohydrates or too many calories at  one sitting increases the likelihood of GI side effects. [name] [name] will continue diet and follow-up with [name] as directed to monitor his progress. He will have labs checked at his next in-office visit in 1 month.  Hyperlipidemia [name] [name] was informed of the American Heart Association Guidelines emphasizing intensive lifestyle modifications as the first line treatment for hyperlipidemia. We discussed many lifestyle modifications today in depth, and [name] [name] will continue to work on decreasing saturated fats such as fatty red meat, butter and many fried foods. [name] [name] will continue diet, exercise, and weight loss. He will have labs checked at his next in-office visit in 1 month. He will also increase vegetables and lean protein in his diet and continue to work on exercise and weight loss efforts.  I spent > than 50% of the 25 minute visit on counseling as documented in the note.  Obesity [name] [name] is currently in the action stage of change. As such, his goal is to continue with weight loss efforts. He has agreed to keep a food journal with 2000 calories and 100+ grams of protein daily. [name] [name] has been instructed to have a goal of 10,000 steps a day for weight loss and overall health benefits. We discussed the following Behavioral Modification Strategies today: increasing lean protein intake and decreasing simple carbohydrates, and better snacking choices.  Danniel has agreed to follow-up with our clinic in 3-4 weeks. He was informed of the importance of frequent follow-up visits to maximize his success with intensive lifestyle modifications for his multiple health  conditions.  ALLERGIES: Allergies  Allergen Reactions   Shrimp [Shellfish Allergy] Anaphylaxis and Rash   Codeine Rash and Other (See Comments)    Blacked out   Contrast Media [Iodinated Diagnostic Agents] Rash   Diazepam Rash    Rash over whole body from neck down, and he lost his voice (airway compromise?)   Amoxicillin Other (See Comments)     Unknown    Benadryl [Diphenhydramine]     With dyes    Depakote [Divalproex Sodium] Swelling    Per pts report (given by Dr. Inda Merlin).  Has throat swelling and rash.   Erythromycin Other (See Comments)    unknown   Flexeril [Cyclobenzaprine]    Allegra [Fexofenadine] Rash        Betadine [Povidone Iodine] Rash   Celebrex [Celecoxib] Rash   Meperidine And Related Rash   Methocarbamol Rash   Neosporin [Neomycin-Bacitracin Zn-Polymyx] Rash   Oxycodone Rash   Penicillins Rash   Plavix [Clopidogrel] Rash   Rivaroxaban Rash   Vioxx [Rofecoxib] Rash    MEDICATIONS: Current Outpatient Medications on File Prior to Visit  Medication Sig Dispense Refill   allopurinol (ZYLOPRIM) 100 MG tablet TAKE 1 TABLET EVERY DAY 90 tablet 3   amitriptyline (ELAVIL) 100 MG tablet Take 1 tablet (100 mg total) by mouth at bedtime. 90 tablet 4   aspirin 81 MG tablet Take 81 mg by mouth daily.     Cholecalciferol (VITAMIN D PO) Take 1 tablet by mouth daily.     Coenzyme Q10 (CO Q 10 PO) Take by mouth daily.     Cyanocobalamin (VITAMIN B 12 PO) Take by mouth.     divalproex (DEPAKOTE) 250 MG DR tablet Take 1 tablet (250 mg total) by mouth 2 (two) times daily. 180 tablet 6   DULoxetine (CYMBALTA) 60 MG capsule Take 1 capsule (60 mg total) by mouth daily. 90 capsule 4   EPINEPHrine 0.3 mg/0.3 mL IJ SOAJ injection INJECT 0.3 MLS INTO THE MUSCLE ONCE 2 mL 2   Erenumab-aooe (AIMOVIG) 140 MG/ML SOAJ Inject 140 mg into the skin every 30 (thirty) days. 4 pen 0   gabapentin (NEURONTIN) 300 MG capsule Take 1 capsule (300 mg total) by mouth 2 (two) times daily. 180 capsule 4   Multiple Vitamin (MULTIVITAMIN) tablet Take 1 tablet by mouth daily.     naproxen (NAPROSYN) 500 MG tablet Take 500 mg by mouth 2 (two) times daily with a meal.     Omega-3 Fatty Acids (FISH OIL) 1000 MG CAPS Take 1 capsule by mouth daily.     pravastatin (PRAVACHOL) 40 MG tablet TAKE 1 TABLET EVERY DAY 90 tablet  3   Psyllium (METAMUCIL PO) Take 2 tablets by mouth 2 (two) times daily.     Red Yeast Rice Extract (RED YEAST RICE PO) Take 1 tablet by mouth daily.     SUMAtriptan (IMITREX) 100 MG tablet TAKE 1 TABLET BY MOUTH ONCE AS NEEDED FOR MIGRAINE. MAY REPEAT IN 2 HOURS IF NEEDED. MAX 2 TABLETS PER 24 HOURS. 27 tablet 0   tiZANidine (ZANAFLEX) 4 MG tablet Take 1 tablet (4 mg total) by mouth 3 (three) times daily as needed for muscle spasms. 270 tablet 4   TURMERIC CURCUMIN PO Take 1,050 mg by mouth daily.     valACYclovir (VALTREX) 500 MG tablet Take 500 mg by mouth daily.     vitamin C (ASCORBIC ACID) 500 MG tablet Take 500 mg by mouth daily.     vitamin E 1000 UNIT capsule  Take 1,000 Units by mouth daily.     Current Facility-Administered Medications on File Prior to Visit  Medication Dose Route Frequency Provider Last Rate Last Dose   0.9 %  sodium chloride infusion  500 mL Intravenous Once Ladene Artist, MD        PAST MEDICAL HISTORY: Past Medical History:  Diagnosis Date   ALLERGIC RHINITIS 04/22/2007   Allergy    Cancer ([place])    HX SKIN CANCER   Complication of anesthesia    more concerned about positioning of head & neck because of TMJ   DDD (degenerative disc disease), cervical    GERD (gastroesophageal reflux disease)    infrequently   Headache(784.0)    migraines   History of kidney stones    History of skin cancer    History of TMJ disorder    HYPERCHOLESTEROLEMIA 04/22/2007   HYPERLIPIDEMIA 07/29/2007   Hyperplastic colon polyp    NEPHROLITHIASIS, HX OF 07/29/2007   Osteoarth NOS-Unspec 04/22/2007   Shortness of breath    Shoulder pain, acute    bilateral    PAST SURGICAL HISTORY: Past Surgical History:  Procedure Laterality Date   APPENDECTOMY     BACK SURGERY  10/07/2015   COLONOSCOPY     10 yrs ago- 2 HPP    COLONOSCOPY  2019   JOINT REPLACEMENT     KNEE ARTHROSCOPY     right x2   SHOULDER SURGERY     right   skin cancer  biopsy     L forehead   TOTAL KNEE ARTHROPLASTY Right 08/25/2013   Procedure: RIGHT TOTAL KNEE ARTHROPLASTY;  Surgeon: Gearlean Alf, MD;  Location: [facility];  Service: Orthopedics;  Laterality: Right;   TOTAL SHOULDER ARTHROPLASTY Left 12/25/2013   DR SUPPLE    TOTAL SHOULDER ARTHROPLASTY Left 12/25/2013   Procedure: LEFT TOTAL SHOULDER ARTHROPLASTY;  Surgeon: Marin Shutter, MD;  Location: [place];  Service: Orthopedics;  Laterality: Left;   TOTAL SHOULDER ARTHROPLASTY Right 06/18/2014   DR SUPPLE   TOTAL SHOULDER ARTHROPLASTY Right 06/18/2014   Procedure: RIGHT TOTAL SHOULDER ARTHROPLASTY;  Surgeon: Marin Shutter, MD;  Location: [place];  Service: Orthopedics;  Laterality: Right;   WISDOM TOOTH EXTRACTION      SOCIAL HISTORY: Social History   Tobacco Use   Smoking status: Never Smoker   Smokeless tobacco: Never Used  Substance Use Topics   Alcohol use: No    Alcohol/week: 0.0 standard drinks   Drug use: No    FAMILY HISTORY: Family History  Problem Relation Age of Onset   COPD Father        family hx   Emphysema Father    Aneurysm Mother    Stroke Mother    Sudden death Mother    Cancer Mother    Obesity Mother    Breast cancer Mother    Cancer Maternal Grandfather        lung   Colon cancer Neg Hx    Colon polyps Neg Hx    ROS: Review of Systems  Cardiovascular: Negative for chest pain.  Musculoskeletal: Negative for myalgias.  Endo/Heme/Allergies:       Positive for polyphagia.   PHYSICAL EXAM: Pt in no acute distress  RECENT LABS AND TESTS: BMET    Component Value Date/Time   NA 140 11/21/2017 0819   K 4.4 11/21/2017 0819   CL 103 11/21/2017 0819   CO2 21 11/21/2017 0819   GLUCOSE 93 11/21/2017 0819   GLUCOSE  94 07/31/2014 0920   GLUCOSE 94 08/20/2006 0830   BUN 25 11/21/2017 0819   CREATININE 1.14 11/21/2017 0819   CALCIUM 9.3 11/21/2017 0819   GFRNONAA 67 11/21/2017 0819   GFRAA 77 11/21/2017 0819   Lab Results  Component  Value Date   HGBA1C 5.5 11/21/2017   HGBA1C 5.8 (H) 06/18/2017   Lab Results  Component Value Date   INSULIN 8.6 11/21/2017   INSULIN 16.5 06/18/2017   CBC    Component Value Date/Time   WBC 5.5 06/18/2017 1047   WBC 5.7 07/31/2014 0920   RBC 4.60 06/18/2017 1047   RBC 4.53 07/31/2014 0920   HGB 15.7 06/18/2017 1047   HCT 45.0 06/18/2017 1047   PLT 313.0 07/31/2014 0920   MCV 98 (H) 06/18/2017 1047   MCH 34.1 (H) 06/18/2017 1047   MCH 31.8 06/10/2014 0955   MCHC 34.9 06/18/2017 1047   MCHC 33.4 07/31/2014 0920   RDW 14.3 06/18/2017 1047   LYMPHSABS 1.8 06/18/2017 1047   MONOABS 0.5 07/31/2014 0920   EOSABS 0.3 06/18/2017 1047   BASOSABS 0.1 06/18/2017 1047   Iron/TIBC/Ferritin/ %Sat No results found for: IRON, TIBC, FERRITIN, IRONPCTSAT Lipid Panel     Component Value Date/Time   CHOL 239 (H) 11/21/2017 0819   TRIG 139 11/21/2017 0819   TRIG 52 08/20/2006 0830   HDL 62 11/21/2017 0819   CHOLHDL 4 07/31/2014 0920   VLDL 24.4 07/31/2014 0920   LDLCALC 149 (H) 11/21/2017 0819   LDLDIRECT 135.0 07/16/2013 0937   Hepatic Function Panel     Component Value Date/Time   PROT 7.0 11/21/2017 0819   ALBUMIN 4.3 11/21/2017 0819   AST 40 11/21/2017 0819   ALT 40 11/21/2017 0819   ALKPHOS 46 11/21/2017 0819   BILITOT 0.5 11/21/2017 0819   BILIDIR 0.1 07/31/2014 0920      Component Value Date/Time   TSH 2.920 06/18/2017 1047   TSH 1.53 07/31/2014 0920   TSH 1.85 07/16/2013 0937   Results for Kloosterman, Jatavis S "DAVE" (MRN [id number]) as of 05/26/2019 12:04  Ref. Range 11/21/2017 08:19  Vitamin D, 25-Hydroxy Latest Ref Range: 30.0 - 100.0 ng/mL 59.6   I, Michaelene Song, am acting as Location manager for Dennard Nip, MD I have reviewed the above documentation for accuracy and completeness, and I agree with the above. -Dennard Nip, MD

## 2019-05-27 ENCOUNTER — Encounter: Payer: Self-pay | Admitting: Family Medicine

## 2019-05-27 ENCOUNTER — Other Ambulatory Visit: Payer: Self-pay

## 2019-05-27 DIAGNOSIS — G43109 Migraine with aura, not intractable, without status migrainosus: Secondary | ICD-10-CM

## 2019-05-27 MED ORDER — PRAVASTATIN SODIUM 40 MG PO TABS: 40.0000 mg | ORAL_TABLET | Freq: Every day | ORAL | 3 refills | Status: DC

## 2019-05-27 MED ORDER — GABAPENTIN 300 MG PO CAPS: 300.0000 mg | ORAL_CAPSULE | Freq: Two times a day (BID) | ORAL | 4 refills | Status: DC

## 2019-05-28 ENCOUNTER — Telehealth: Payer: Self-pay | Admitting: *Deleted

## 2019-05-28 NOTE — Telephone Encounter (Signed)
Started an Aimovig 140 mg pa on CMM. [id number]. PA not completed yet.

## 2019-06-03 DIAGNOSIS — G4733 Obstructive sleep apnea (adult) (pediatric): Secondary | ICD-10-CM | POA: Diagnosis not present

## 2019-06-04 NOTE — Progress Notes (Signed)
Office: [phone]  /  Fax: [phone]    Date: June 16, 2019   Appointment Start Time: 8:30am Duration: 31 minutes Provider: Glennie Isle, Psy.D. Type of Session: Individual Therapy  Location of Patient: Home Location of Provider: Provider's Home Type of Contact: Telepsychological Visit via [employer] WebEx   Session Content: [name] [name] is a 68 y.o. male presenting via [place] for a follow-up appointment to address the previously established treatment goal of decreasing emotional eating. Today's appointment was a telepsychological visit, as this provider's clinic is seeing a limited number of patients for in-person visits due to COVID-19. Therapeutic services will resume to in-person appointments once deemed appropriate. [name] [name] expressed understanding regarding the rationale for telepsychological services, and provided verbal consent for today's appointment. Prior to proceeding with today's appointment, [name] [name]'s physical location at the time of this appointment was obtained. [name] [name] reported he was at home and provided the address. In the event of technical difficulties, [name] [name] shared a phone number he could be reached at. [name] [name] and this provider participated in today's telepsychological service. Also, [name] [name] denied anyone else being present in the room or on the WebEx appointment.  This provider conducted a brief check-in and verbally administered the PHQ-9 and GAD-7. [name] [name] shared, "I pretty much adhere to the diet [name] gave me." He also noted a reduction in migraines. [name] [name] also reported "finding" his "comfort zone" with walking. He reported experiencing "frustration" with weight loss; however, he noted he lost 10 pounds and shared he has not consumed ice cream in 70 days. Positive reinforcement was provided. Regarding sleep, [name] [name] reported improved sleep recently. To assist with coping, psychoeducation regarding thought defusion, including its impact on emotional eating and overall  well-being was provided. [name] [name] was led through a thought defusion exercise, and a handout with various exercises was provided. [name] [name] was encouraged to engage in the thought defusion exercises between now and the next appointment with this provider. [name] [name] agreed. The following thought was used for today's exercise: "I am fat." His experience was processed. [name] [name] noted, "I didn't feel too much. I felt frustration." As the exercise progressed, [name] [name] shared, "It was just the reality." This provider further discussed thought fusion and led him through two additional exercises. He was observed laughing. [name] [name] provided verbal consent during today's appointment for this provider to send a handout with thought defusion exercises via e-mail. Regarding a referral for longer-term therapeutic services, [name] [name] stated, "I don't want to go further with someone else." However, he was receptive to discussing the option further in the future if deemed necessary. [name] [name] was receptive to today's session as evidenced by openness to sharing, responsiveness to feedback, and willingness to engage in thought defusion exercises.  Mental Status Examination:  Appearance: neat Behavior: cooperative Mood: euthymic Affect: mood congruent Speech: normal in rate, volume, and tone Eye Contact: appropriate Psychomotor Activity: appropriate Thought Process: linear, logical, and goal directed  Content/Perceptual Disturbances: no hallucinations, delusions, bizarre thinking or behavior reported or observed and no evidence of suicidal and homicidal ideation, plan, and intent Orientation: time, person, place and purpose of appointment Cognition/Sensorium: memory, attention, language, and fund of knowledge intact  Insight: good Judgment: good  Structured Assessment Results: The Patient Health Questionnaire-9 (PHQ-9) is a self-report measure that assesses symptoms and severity of depression over the course of the last two weeks. [name] [name] obtained  a score of 4 suggesting minimal depression. [name] [name] finds the endorsed symptoms to be not difficult at all. [name] [name]  Feeling down,  depressed, or hopeless [name]  Trouble falling or staying asleep, or sleeping too much [name]  Feeling tired or having [name] energy [name]  Poor appetite or overeating 1  Feeling bad about yourself --- or that you are a failure or have let yourself or your family down- "I don't like being fat." 3  Trouble concentrating on things, such as reading the newspaper or watching television [name]  Moving or speaking so slowly that other people could have noticed? Or the opposite --- being so fidgety or restless that you have been moving around a lot more than usual [name]  Thoughts that you would be better off dead or hurting yourself in some way [name]  PHQ-9 Score 4    The Generalized Anxiety Disorder-7 (GAD-7) is a brief self-report measure that assesses symptoms of anxiety over the course of the last two weeks. [name] [name] obtained a score of 2 suggesting minimal anxiety. [name] [name] finds the endorsed symptoms to be not difficult at all. Feeling nervous, anxious, on edge 1  Not being able to stop or control worrying [name]  Worrying too much about different things [name]  Trouble relaxing [name]  Being so restless that it's hard to sit still [name]  Becoming easily annoyed or irritable 1  Feeling afraid as if something awful might happen [name]  GAD-7 Score 2   Interventions:  Conducted a brief chart review Verbal administration of PHQ-9 and GAD-7 for symptom monitoring Provided empathic reflections and validation Reviewed content from the previous session Psychoeducation provided regarding thought defusion Engaged patient in a thought defusion exercise Employed supportive psychotherapy interventions to facilitate reduced distress, and to improve coping skills with identified stressors Employed acceptance and commitment interventions to emphasize mindfulness and acceptance without struggle   Positive reinforcement provided  DSM-5 Diagnosis: 311 (F32.8) Other Specified Depressive Disorder, Emotional Eating Behaviors  Treatment Goal & Progress: During the initial appointment with this provider, the following treatment goal was established: decrease emotional eating. Dejion has demonstrated progress in his goal as evidenced by increased awareness of hunger patterns and triggers for emotional eating. Tammie also reported demonstrates willingness to engage in thought defusion exercises.  Plan: Dreven continues to appear able and willing to participate as evidenced by engagement in reciprocal conversation, and asking questions for clarification as appropriate. Zelig requested to schedule an appointment in 4 weeks; however, this provider recommended a follow-up appointment be scheduled in 3 weeks. He was agreeable; therefore, the next appointment will be scheduled in three weeks, which will be via [employer]. The next session will focus further on thought defusion.

## 2019-06-09 ENCOUNTER — Other Ambulatory Visit: Payer: Self-pay

## 2019-06-09 MED ORDER — ALLOPURINOL 100 MG PO TABS: 100.0000 mg | ORAL_TABLET | Freq: Every day | ORAL | 3 refills | Status: DC

## 2019-06-12 ENCOUNTER — Other Ambulatory Visit: Payer: Self-pay | Admitting: *Deleted

## 2019-06-12 ENCOUNTER — Telehealth: Payer: Self-pay | Admitting: *Deleted

## 2019-06-12 ENCOUNTER — Encounter: Payer: Self-pay | Admitting: *Deleted

## 2019-06-12 MED ORDER — AIMOVIG 140 MG/ML ~~LOC~~ SOAJ: 140.0000 mg | SUBCUTANEOUS | 11 refills | Status: DC

## 2019-06-12 NOTE — Telephone Encounter (Signed)
Completed Aimovig PA on CMM. Key: [id number].   [employer] immediately approved the medication. PA Case: [id number], Status: Approved, Coverage Starts on: 06/12/2019 12:00:00 AM, Coverage Ends on: 09/10/2019 12:00:00 AM. Questions? Contact [phone].   Awaiting approval via fax. I messaged pt in [place] with the update.

## 2019-06-16 ENCOUNTER — Ambulatory Visit (INDEPENDENT_AMBULATORY_CARE_PROVIDER_SITE_OTHER): Payer: Medicare HMO | Admitting: Psychology

## 2019-06-16 ENCOUNTER — Encounter (INDEPENDENT_AMBULATORY_CARE_PROVIDER_SITE_OTHER): Payer: Self-pay | Admitting: Family Medicine

## 2019-06-16 ENCOUNTER — Other Ambulatory Visit: Payer: Self-pay

## 2019-06-16 DIAGNOSIS — F3289 Other specified depressive episodes: Secondary | ICD-10-CM

## 2019-06-16 NOTE — Telephone Encounter (Signed)
Received the approval letter for Aimovig from [facility]. I faxed it to [place]. Received a receipt of confirmation.

## 2019-06-18 ENCOUNTER — Telehealth (INDEPENDENT_AMBULATORY_CARE_PROVIDER_SITE_OTHER): Payer: Medicare HMO | Admitting: Family Medicine

## 2019-06-19 ENCOUNTER — Ambulatory Visit (INDEPENDENT_AMBULATORY_CARE_PROVIDER_SITE_OTHER): Payer: Medicare HMO | Admitting: Family Medicine

## 2019-06-19 ENCOUNTER — Encounter (INDEPENDENT_AMBULATORY_CARE_PROVIDER_SITE_OTHER): Payer: Self-pay | Admitting: Family Medicine

## 2019-06-19 ENCOUNTER — Encounter: Payer: Self-pay | Admitting: Family Medicine

## 2019-06-19 ENCOUNTER — Ambulatory Visit (INDEPENDENT_AMBULATORY_CARE_PROVIDER_SITE_OTHER): Payer: Medicare HMO

## 2019-06-19 ENCOUNTER — Other Ambulatory Visit: Payer: Self-pay

## 2019-06-19 VITALS — BP 107/69 | HR 68 | Temp 98.3°F | Ht 69.0 in | Wt 225.0 lb

## 2019-06-19 DIAGNOSIS — R0602 Shortness of breath: Secondary | ICD-10-CM | POA: Diagnosis not present

## 2019-06-19 DIAGNOSIS — E669 Obesity, unspecified: Secondary | ICD-10-CM

## 2019-06-19 DIAGNOSIS — R5383 Other fatigue: Secondary | ICD-10-CM | POA: Diagnosis not present

## 2019-06-19 DIAGNOSIS — Z23 Encounter for immunization: Secondary | ICD-10-CM | POA: Diagnosis not present

## 2019-06-19 DIAGNOSIS — Z6833 Body mass index (BMI) 33.0-33.9, adult: Secondary | ICD-10-CM | POA: Diagnosis not present

## 2019-06-19 DIAGNOSIS — E66811 Obesity, class 1: Secondary | ICD-10-CM

## 2019-06-20 LAB — COMPREHENSIVE METABOLIC PANEL
ALT: 41 IU/L (ref 0–44)
AST: 45 IU/L — ABNORMAL HIGH (ref 0–40)
Albumin/Globulin Ratio: 1.6 (ref 1.2–2.2)
Albumin: 4.4 g/dL (ref 3.8–4.8)
Alkaline Phosphatase: 55 IU/L (ref 39–117)
BUN/Creatinine Ratio: 24 (ref 10–24)
BUN: 29 mg/dL — ABNORMAL HIGH (ref 8–27)
Bilirubin Total: 0.4 mg/dL (ref 0.0–1.2)
CO2: 23 mmol/L (ref 20–29)
Calcium: 9.2 mg/dL (ref 8.6–10.2)
Chloride: 101 mmol/L (ref 96–106)
Creatinine, Ser: 1.21 mg/dL (ref 0.76–1.27)
GFR calc Af Amer: 71 mL/min/{1.73_m2} (ref >59)
GFR calc non Af Amer: 61 mL/min/{1.73_m2} (ref >59)
Globulin, Total: 2.8 g/dL (ref 1.5–4.5)
Glucose: 102 mg/dL — ABNORMAL HIGH (ref 65–99)
Potassium: 4.5 mmol/L (ref 3.5–5.2)
Sodium: 138 mmol/L (ref 134–144)
Total Protein: 7.2 g/dL (ref 6.0–8.5)

## 2019-06-20 LAB — HEMOGLOBIN A1C
Est. average glucose Bld gHb Est-mCnc: 117 mg/dL
Hgb A1c MFr Bld: 5.7 % — ABNORMAL HIGH (ref 4.8–5.6)

## 2019-06-20 LAB — LIPID PANEL WITH LDL/HDL RATIO
Cholesterol, Total: 197 mg/dL (ref 100–199)
HDL: 71 mg/dL (ref >39)
LDL Chol Calc (NIH): 112 mg/dL — ABNORMAL HIGH (ref 0–99)
LDL/HDL Ratio: 1.6 ratio (ref 0.0–3.6)
Triglycerides: 77 mg/dL (ref 0–149)
VLDL Cholesterol Cal: 14 mg/dL (ref 5–40)

## 2019-06-20 LAB — T3: T3, Total: 97 ng/dL (ref 71–180)

## 2019-06-20 LAB — T4, FREE: Free T4: 0.85 ng/dL (ref 0.82–1.77)

## 2019-06-20 LAB — VITAMIN D 25 HYDROXY (VIT D DEFICIENCY, FRACTURES): Vit D, 25-Hydroxy: 78.1 ng/mL (ref 30.0–100.0)

## 2019-06-20 LAB — INSULIN, RANDOM: INSULIN: 8.5 u[IU]/mL (ref 2.6–24.9)

## 2019-06-20 LAB — TSH: TSH: 2.15 u[IU]/mL (ref 0.450–4.500)

## 2019-06-23 NOTE — Progress Notes (Signed)
Office: [phone]  /  Fax: [phone]   HPI:   Chief Complaint: OBESITY [name] [name] is here to discuss his progress with his obesity treatment plan. He is on the  keep a food journal with 2000 calories and 100+g of protein  daily and is following his eating plan approximately 85 % of the time. He states he is exercising by walking for 120 minutes 5 times per week. [name] [name] has done better meeting his weight loss goals. He is journaling regularly and keeping his calories and protein at goal.  His weight is 225 lb (102.1 kg) today and has had a weight loss of 5 pounds over a period of 6 months since his  last in office visit. He has lost 5 lbs since starting treatment with [name].  [name] [name] notes increasing shortness of breath with exercising and seems to be worsening over time with weight gain. He notes getting out of breath sooner with activity than he used to. This has not gotten worse recently. His SOB with activity improved with increased activity and weight loss.  Edker denies shortness of breath at rest, edema, or orthopnea.  Fatigue [name] [name] feels his energy is lower than it should be. This has worsened with weight gain and has not worsened recently. He notes improved fatigue since starting back to his prescribed diet but still has residual fatigue and would like labs tested.  [name] [name] denies daytime somnolence and patient admits to sleeping at least 7 hors per night.  ASSESSMENT AND PLAN:  SOB (shortness of breath) on exertion - Plan: Lipid Panel With LDL/HDL Ratio  Other fatigue - Plan: Comprehensive metabolic panel, Hemoglobin A1c, Insulin, random, VITAMIN D 25 Hydroxy (Vit-D Deficiency, Fractures), T4, free, TSH, T3  Class 1 obesity with serious comorbidity and body mass index (BMI) of 33.0 to 33.9 in adult, unspecified obesity type  PLAN: [name] with exercise [name]'s shortness of breath appears to be obesity related and exercise induced. The indirect calorimeter  results showed VO2 of 450 and a REE of 3128. He has agreed to work on weight loss and gradually increase exercise to treat his exercise induced shortness of breath. If [name] [name] follows our instructions and loses weight without improvement of his shortness of breath, we will plan to refer to pulmonology. [name] [name] agrees to this plan. We will repeat labs today.   Fatigue [name] [name] was informed that his fatigue may be related to obesity, depression or many other causes. Labs will be ordered, and in the meanwhile [name] [name] has agreed to work on diet, exercise and weight loss to help with fatigue. Proper sleep hygiene was discussed including the need for 7-8 hours of quality sleep each night.  Obesity [name] [name] is currently in the action stage of change. As such, his goal is to continue with weight loss efforts He has agreed to keep a food journal with 2000 calories and 120+g of  protein daily.  [name] [name] has been instructed to work up to a goal of 150 minutes of combined cardio and strengthening exercise per week for weight loss and overall health benefits. We discussed the following Behavioral Modification Strategies today: increasing lean protein intake, decreasing simple carbohydrates  and ways to avoid boredom eating   [name] [name] has agreed to follow up with our clinic in 3 weeks. He was informed of the importance of frequent follow up visits to maximize his success with intensive lifestyle modifications for his multiple health conditions.  ALLERGIES: Allergies  Allergen Reactions  . Shrimp [Shellfish Allergy] Anaphylaxis  and Rash  . Codeine Rash and Other (See Comments)    Blacked out  . Contrast Media [Iodinated Diagnostic Agents] Rash  . Diazepam Rash    Rash over whole body from neck down, and he lost his voice (airway compromise?)  . Amoxicillin Other (See Comments)    Unknown   . Benadryl [Diphenhydramine]     With dyes   . Depakote [Divalproex Sodium] Swelling    Per pts report (given by Dr. Inda Merlin).   Has throat swelling and rash.  . Erythromycin Other (See Comments)    unknown  . Flexeril [Cyclobenzaprine]   . Allegra [Fexofenadine] Rash       . Betadine [Povidone Iodine] Rash  . Celebrex [Celecoxib] Rash  . Meperidine And Related Rash  . Methocarbamol Rash  . Neosporin [Neomycin-Bacitracin Zn-Polymyx] Rash  . Oxycodone Rash  . Penicillins Rash  . Plavix [Clopidogrel] Rash  . Rivaroxaban Rash  . Vioxx [Rofecoxib] Rash    MEDICATIONS: Current Outpatient Medications on File Prior to Visit  Medication Sig Dispense Refill  . allopurinol (ZYLOPRIM) 100 MG tablet Take 1 tablet (100 mg total) by mouth daily. 90 tablet 3  . amitriptyline (ELAVIL) 100 MG tablet Take 1 tablet (100 mg total) by mouth at bedtime. 90 tablet 4  . aspirin 81 MG tablet Take 81 mg by mouth daily.    . Cholecalciferol (VITAMIN D PO) Take 1 tablet by mouth daily.    . Coenzyme Q10 (CO Q 10 PO) Take by mouth daily.    . Cyanocobalamin (VITAMIN B 12 PO) Take by mouth.    . divalproex (DEPAKOTE) 250 MG DR tablet Take 1 tablet (250 mg total) by mouth 2 (two) times daily. 180 tablet 6  . DULoxetine (CYMBALTA) 60 MG capsule Take 1 capsule (60 mg total) by mouth daily. 90 capsule 4  . EPINEPHrine 0.3 mg/0.3 mL IJ SOAJ injection INJECT 0.3 MLS INTO THE MUSCLE ONCE 2 mL 2  . Erenumab-aooe (AIMOVIG) 140 MG/ML SOAJ Inject 140 mg into the skin every 30 (thirty) days. 1 pen 11  . gabapentin (NEURONTIN) 300 MG capsule Take 1 capsule (300 mg total) by mouth 2 (two) times daily. 180 capsule 4  . Multiple Vitamin (MULTIVITAMIN) tablet Take 1 tablet by mouth daily.    . naproxen (NAPROSYN) 500 MG tablet Take 500 mg by mouth 2 (two) times daily with a meal.    . Omega-3 Fatty Acids (FISH OIL) 1000 MG CAPS Take 1 capsule by mouth daily.    . pravastatin (PRAVACHOL) 40 MG tablet Take 1 tablet (40 mg total) by mouth daily. 90 tablet 3  . Psyllium (METAMUCIL PO) Take 2 tablets by mouth 2 (two) times daily.    . Red Yeast Rice  Extract (RED YEAST RICE PO) Take 1 tablet by mouth daily.    . SUMAtriptan (IMITREX) 100 MG tablet TAKE 1 TABLET BY MOUTH ONCE AS NEEDED FOR MIGRAINE. MAY REPEAT IN 2 HOURS IF NEEDED. MAX 2 TABLETS PER 24 HOURS. 27 tablet 0  . tiZANidine (ZANAFLEX) 4 MG tablet Take 1 tablet (4 mg total) by mouth 3 (three) times daily as needed for muscle spasms. 270 tablet 4  . TURMERIC CURCUMIN PO Take 1,050 mg by mouth daily.    . valACYclovir (VALTREX) 500 MG tablet Take 500 mg by mouth daily.    . vitamin C (ASCORBIC ACID) 500 MG tablet Take 500 mg by mouth daily.    . vitamin E 1000 UNIT capsule Take 1,000 Units by mouth  daily.     Current Facility-Administered Medications on File Prior to Visit  Medication Dose Route Frequency Provider Last Rate Last Dose  . 0.9 %  sodium chloride infusion  500 mL Intravenous Once Ladene Artist, MD        PAST MEDICAL HISTORY: Past Medical History:  Diagnosis Date  . ALLERGIC RHINITIS 04/22/2007  . Allergy   . Cancer (HCC)    HX SKIN CANCER  . Complication of anesthesia    more concerned about positioning of head & neck because of TMJ  . DDD (degenerative disc disease), cervical   . GERD (gastroesophageal reflux disease)    infrequently  . Headache(784.0)    migraines  . History of kidney stones   . History of skin cancer   . History of TMJ disorder   . HYPERCHOLESTEROLEMIA 04/22/2007  . HYPERLIPIDEMIA 07/29/2007  . Hyperplastic colon polyp   . NEPHROLITHIASIS, HX OF 07/29/2007  . Osteoarth NOS-Unspec 04/22/2007  . Shortness of breath   . Shoulder pain, acute    bilateral    PAST SURGICAL HISTORY: Past Surgical History:  Procedure Laterality Date  . APPENDECTOMY    . BACK SURGERY  10/07/2015  . COLONOSCOPY     10 yrs ago- 2 HPP   . COLONOSCOPY  2019  . JOINT REPLACEMENT    . KNEE ARTHROSCOPY     right x2  . SHOULDER SURGERY     right  . skin cancer biopsy     L forehead  . TOTAL KNEE ARTHROPLASTY Right 08/25/2013   Procedure: RIGHT TOTAL  KNEE ARTHROPLASTY;  Surgeon: Gearlean Alf, MD;  Location: [facility];  Service: Orthopedics;  Laterality: Right;  . TOTAL SHOULDER ARTHROPLASTY Left 12/25/2013   DR SUPPLE   . TOTAL SHOULDER ARTHROPLASTY Left 12/25/2013   Procedure: LEFT TOTAL SHOULDER ARTHROPLASTY;  Surgeon: Marin Shutter, MD;  Location: [place];  Service: Orthopedics;  Laterality: Left;  . TOTAL SHOULDER ARTHROPLASTY Right 06/18/2014   DR SUPPLE  . TOTAL SHOULDER ARTHROPLASTY Right 06/18/2014   Procedure: RIGHT TOTAL SHOULDER ARTHROPLASTY;  Surgeon: Marin Shutter, MD;  Location: [place];  Service: Orthopedics;  Laterality: Right;  . WISDOM TOOTH EXTRACTION      SOCIAL HISTORY: Social History   Tobacco Use  . Smoking status: Never Smoker  . Smokeless tobacco: Never Used  Substance Use Topics  . Alcohol use: No    Alcohol/week: 0.0 standard drinks  . Drug use: No    FAMILY HISTORY: Family History  Problem Relation Age of Onset  . COPD Father        family hx  . Emphysema Father   . Aneurysm Mother   . Stroke Mother   . Sudden death Mother   . Cancer Mother   . Obesity Mother   . Breast cancer Mother   . Cancer Maternal Grandfather        lung  . Colon cancer Neg Hx   . Colon polyps Neg Hx     ROS: Review of Systems  Constitutional: Positive for malaise/fatigue and weight loss.  Respiratory: Positive for shortness of breath. Negative for cough.   Cardiovascular: Negative for leg swelling.    PHYSICAL EXAM: Blood pressure 107/69, pulse 68, temperature 98.3 F (36.8 C), height 5\' 9"  (1.753 m), weight 225 lb (102.1 kg), SpO2 97 %. Body mass index is 33.23 kg/m. Physical Exam Vitals signs reviewed.  Constitutional:      Appearance: Normal appearance. He is obese.  HENT:  Head: Normocephalic.     Nose: Nose normal.  Neck:     Musculoskeletal: Normal range of motion.  Cardiovascular:     Rate and Rhythm: Normal rate.  Pulmonary:     Effort: Pulmonary effort is normal.  Musculoskeletal: Normal  range of motion.  Skin:    General: Skin is warm and dry.  Neurological:     Mental Status: He is alert and oriented to person, place, and time.  Psychiatric:        Mood and Affect: Mood normal.        Behavior: Behavior normal.     RECENT LABS AND TESTS: BMET    Component Value Date/Time   NA 138 06/19/2019 1436   K 4.5 06/19/2019 1436   CL 101 06/19/2019 1436   CO2 23 06/19/2019 1436   GLUCOSE 102 (H) 06/19/2019 1436   GLUCOSE 94 07/31/2014 0920   GLUCOSE 94 08/20/2006 0830   BUN 29 (H) 06/19/2019 1436   CREATININE 1.21 06/19/2019 1436   CALCIUM 9.2 06/19/2019 1436   GFRNONAA 61 06/19/2019 1436   GFRAA 71 06/19/2019 1436   Lab Results  Component Value Date   HGBA1C 5.7 (H) 06/19/2019   HGBA1C 5.5 11/21/2017   HGBA1C 5.8 (H) 06/18/2017   Lab Results  Component Value Date   INSULIN 8.5 06/19/2019   INSULIN 8.6 11/21/2017   INSULIN 16.5 06/18/2017   CBC    Component Value Date/Time   WBC 5.5 06/18/2017 1047   WBC 5.7 07/31/2014 0920   RBC 4.60 06/18/2017 1047   RBC 4.53 07/31/2014 0920   HGB 15.7 06/18/2017 1047   HCT 45.0 06/18/2017 1047   PLT 313.0 07/31/2014 0920   MCV 98 (H) 06/18/2017 1047   MCH 34.1 (H) 06/18/2017 1047   MCH 31.8 06/10/2014 0955   MCHC 34.9 06/18/2017 1047   MCHC 33.4 07/31/2014 0920   RDW 14.3 06/18/2017 1047   LYMPHSABS 1.8 06/18/2017 1047   MONOABS 0.5 07/31/2014 0920   EOSABS 0.3 06/18/2017 1047   BASOSABS 0.1 06/18/2017 1047   Iron/TIBC/Ferritin/ %Sat No results found for: IRON, TIBC, FERRITIN, IRONPCTSAT Lipid Panel     Component Value Date/Time   CHOL 197 06/19/2019 1436   TRIG 77 06/19/2019 1436   TRIG 52 08/20/2006 0830   HDL 71 06/19/2019 1436   CHOLHDL 4 07/31/2014 0920   VLDL 24.4 07/31/2014 0920   LDLCALC 149 (H) 11/21/2017 0819   LDLDIRECT 135.0 07/16/2013 0937   Hepatic Function Panel     Component Value Date/Time   PROT 7.2 06/19/2019 1436   ALBUMIN 4.4 06/19/2019 1436   AST 45 (H) 06/19/2019 1436    ALT 41 06/19/2019 1436   ALKPHOS 55 06/19/2019 1436   BILITOT 0.4 06/19/2019 1436   BILIDIR 0.1 07/31/2014 0920      Component Value Date/Time   TSH 2.150 06/19/2019 1436   TSH 2.920 06/18/2017 1047   TSH 1.53 07/31/2014 0920      OBESITY BEHAVIORAL INTERVENTION VISIT  Today's visit was # 20   Starting weight: 230 lbs Starting date: 06/18/17 Today's weight : Weight: 225 lb (102.1 kg)  Today's date: 06/19/19 Total lbs lost to date: 5 lbs At least 15 minutes were spent on discussing the following behavioral intervention visit.   ASK: We discussed the diagnosis of obesity with [name] today and Shanon [name] agreed to give [name] permission to discuss obesity behavioral modification therapy today.  ASSESS: Lute has the diagnosis of obesity and his BMI today  is 32.33 Priyam is in the action stage of change   ADVISE: Andren was educated on the multiple health risks of obesity as well as the benefit of weight loss to improve his health. He was advised of the need for long term treatment and the importance of lifestyle modifications to improve his current health and to decrease his risk of future health problems.  AGREE: Multiple dietary modification options and treatment options were discussed and  Jerrelle agreed to follow the recommendations documented in the above note.  ARRANGE: Kris was educated on the importance of frequent visits to treat obesity as outlined per CMS and USPSTF guidelines and agreed to schedule his next follow up appointment today.  I, Renee Ramus, am acting as transcriptionist for Dennard Nip, MD  I have reviewed the above documentation for accuracy and completeness, and I agree with the above. -Dennard Nip, MD

## 2019-06-25 DIAGNOSIS — Z0100 Encounter for examination of eyes and vision without abnormal findings: Secondary | ICD-10-CM | POA: Diagnosis not present

## 2019-06-25 DIAGNOSIS — H524 Presbyopia: Secondary | ICD-10-CM | POA: Diagnosis not present

## 2019-06-30 MED ORDER — AIMOVIG 140 MG/ML ~~LOC~~ SOAJ: 140.0000 mg | SUBCUTANEOUS | 1 refills | Status: DC

## 2019-07-01 NOTE — Progress Notes (Signed)
Office: [phone]  /  Fax: [phone]    Date: July 07, 2019   Appointment Start Time: 8:02am Duration: 30 minutes Provider: Glennie Isle, Psy.D. Type of Session: Individual Therapy  Location of Patient: Home Location of Provider: Provider's Home Type of Contact: Telepsychological Visit via [employer] WebEx   Session Content: [name] [name] is a 68 y.o. male presenting via [place] for a follow-up appointment to address the previously established treatment goal of decreasing emotional eating. Today's appointment was a telepsychological visit, as it is an option for appointments to reduce exposure to COVID-19. [name] [name] expressed understanding regarding the rationale for telepsychological services, and provided verbal consent for today's appointment. Prior to proceeding with today's appointment, [name] [name]'s physical location at the time of this appointment was obtained. [name] [name] reported he was at home and provided the address. In the event of technical difficulties, [name] [name] shared a phone number he could be reached at. [name] [name] and this provider participated in today's telepsychological service. Also, [name] [name] denied anyone else being present in the room or on the WebEx appointment.  This provider conducted a brief check-in. [name] [name] shared, "I'm doing better with my diet. I lost a few pounds." He added, "I believe I've gotten over the worse. I've made some progress." He shared he continues to "avoid the news" as it "enrages" him. In addition, [name] [name] reported he has gone approximately 80 days without eating ice cream. He also indicated a reduction in emotional eating; however, he shared he continues to eat at night, but explained he plans ahead. Positive reinforcement was provided. Regarding thought defusion, [name] [name] shared he engaged in it as needed to assist with coping since the last appointment with this provider. Positive reinforcement was provided. He was led through an additional thought defusion exercise during  today's appointment, "Leaves on a Stream." He described the exercise as "interesting." [name] [name] explained, "I had a lot of good thoughts and I had a lot of tension." He was encouraged to engage in this exercise daily; [name] [name] agreed. This provider sent [name] [name] a MyChart message with a handout for today's exercise and the handout shared during the last appointment (per [name]'s request). Prior to sending the message, this provider explained the message would be visible to all providers, as it would be part of the electronic medical record. [name] [name] verbally acknowledged understanding, and verbally consented to this provider sending the MyChart message. Overall, [name] [name] was receptive to today's session as evidenced by openness to sharing, responsiveness to feedback, and willingness to engage in learned skills. In addition, [name] [name] stated, "These sessions have helped me get back on track."  Mental Status Examination:  Appearance: neat Behavior: cooperative Mood: euthymic Affect: mood congruent Speech: normal in rate, volume, and tone Eye Contact: appropriate Psychomotor Activity: appropriate Thought Process: linear, logical, and goal directed  Content/Perceptual Disturbances: no hallucinations, delusions, bizarre thinking or behavior reported or observed and no evidence of suicidal and homicidal ideation, plan, and intent Orientation: time, person, place and purpose of appointment Cognition/Sensorium: memory, attention, language, and fund of knowledge intact  Insight: good Judgment: good  Interventions:  Conducted a brief chart review Provided empathic reflections and validation Reviewed content from the previous session Engaged patient in a thought defusion exercise Provided positive reinforcement Employed supportive psychotherapy interventions to facilitate reduced distress, and to improve coping skills with identified stressors Employed acceptance and commitment interventions to emphasize mindfulness and  acceptance without struggle  DSM-5 Diagnosis: 311 (F32.8) Other Specified Depressive Disorder, Emotional Eating Behaviors  Treatment Goal & Progress: During the initial appointment  with this provider, the following treatment goal was established: decrease emotional eating. [name] [name] has demonstrated progress in his goal as evidenced by increased awareness of hunger patterns and triggers for emotional eating. [name] [name] also reported a reduction in emotional eating and demonstrates willingness to engage in thought defusion exercises.  Plan: [name] [name] declined future appointments with this provider. He noted, "I've turned a corner and I need to be responsible." He acknowledged understanding that he may request a follow-up appointment with this provider in the future as long as he is still established with the clinic. No further follow-up planned by this provider.

## 2019-07-06 ENCOUNTER — Encounter: Payer: Self-pay | Admitting: Adult Health

## 2019-07-07 ENCOUNTER — Encounter (INDEPENDENT_AMBULATORY_CARE_PROVIDER_SITE_OTHER): Payer: Self-pay | Admitting: Family Medicine

## 2019-07-07 ENCOUNTER — Encounter (INDEPENDENT_AMBULATORY_CARE_PROVIDER_SITE_OTHER): Payer: Self-pay

## 2019-07-07 ENCOUNTER — Ambulatory Visit (INDEPENDENT_AMBULATORY_CARE_PROVIDER_SITE_OTHER): Payer: Medicare HMO | Admitting: Psychology

## 2019-07-07 ENCOUNTER — Other Ambulatory Visit: Payer: Self-pay

## 2019-07-07 DIAGNOSIS — F3289 Other specified depressive episodes: Secondary | ICD-10-CM | POA: Diagnosis not present

## 2019-07-10 ENCOUNTER — Other Ambulatory Visit: Payer: Self-pay

## 2019-07-10 ENCOUNTER — Encounter (INDEPENDENT_AMBULATORY_CARE_PROVIDER_SITE_OTHER): Payer: Self-pay | Admitting: Family Medicine

## 2019-07-10 ENCOUNTER — Ambulatory Visit (INDEPENDENT_AMBULATORY_CARE_PROVIDER_SITE_OTHER): Payer: Medicare HMO | Admitting: Family Medicine

## 2019-07-10 VITALS — BP 127/80 | HR 79 | Temp 98.2°F | Ht 69.0 in | Wt 225.0 lb

## 2019-07-10 DIAGNOSIS — R7303 Prediabetes: Secondary | ICD-10-CM

## 2019-07-10 DIAGNOSIS — E559 Vitamin D deficiency, unspecified: Secondary | ICD-10-CM | POA: Diagnosis not present

## 2019-07-10 DIAGNOSIS — E669 Obesity, unspecified: Secondary | ICD-10-CM | POA: Diagnosis not present

## 2019-07-10 DIAGNOSIS — Z6833 Body mass index (BMI) 33.0-33.9, adult: Secondary | ICD-10-CM | POA: Diagnosis not present

## 2019-07-15 DIAGNOSIS — G4733 Obstructive sleep apnea (adult) (pediatric): Secondary | ICD-10-CM | POA: Diagnosis not present

## 2019-07-17 NOTE — Progress Notes (Signed)
Office: [phone]  /  Fax: [phone]   HPI:   Chief Complaint: OBESITY [name] [name] is here to discuss his progress with his obesity treatment plan. He is on the keep a food journal with 2000 calories and 120+ grams of protein daily and is following his eating plan approximately 85 % of the time. He states he is walking for 60 minutes 6-7 times per week. [name] [name] has done well maintaining his weight, but is struggling to keep his calories at goal. He is seeing Dr. Mallie Mussel for his obsessing and perfectionist tendencies. He has brought his spread sheet of food, calories, exercise, sugar intake, and protein intake again today. (he does this on his own, not per our recommendations).  His weight is 225 lb (102.1 kg) today and has not lost weight since his last visit. He has lost 5 lbs since starting treatment with [name].  Vitamin D Deficiency Pistol has a diagnosis of vitamin D deficiency. He is stable on prescription Vit D, and level is now at goal. He denies nausea, vomiting or muscle weakness.  Pre-Diabetes Dmarius has a diagnosis of pre-diabetes based on his elevated Hgb A1c and was informed this puts him at greater risk of developing diabetes. His A1c has increase, and he had increased sugar/ice cream during this time but has done better recently. He is not taking metformin currently and continues to work on diet and exercise to decrease risk of diabetes. He denies nausea or hypoglycemia.  ASSESSMENT AND PLAN:  Vitamin D deficiency  Prediabetes  Class 1 obesity with serious comorbidity and body mass index (BMI) of 33.0 to 33.9 in adult, unspecified obesity type  PLAN:  Vitamin D Deficiency [name] was informed that low vitamin D levels contributes to fatigue and are associated with obesity, breast, and colon cancer. [name] agrees to change to OTC Vit D 1,000 IU daily and will follow up for routine testing of vitamin D, at least 2-3 times per year. He was informed of the risk of over-replacement of  vitamin D and agrees to not increase his dose unless he discusses this with [name] first. Lorenz agrees to follow up with our clinic in 3 to 4 weeks.  Pre-Diabetes [name] will continue to work on weight loss, diet, exercise, and decreasing simple carbohydrates in his diet to help decrease the risk of diabetes. We dicussed metformin including benefits and risks. He was informed that eating too many simple carbohydrates or too many calories at one sitting increases the likelihood of GI side effects. [name] agrees to follow up with [name] as directed to monitor his progress.  I spent > than 50% of the 25 minute visit on counseling as documented in the note.  Obesity [name] is currently in the action stage of change. As such, his goal is to continue with weight loss efforts He has agreed to follow the Category 3 plan [name] has been instructed to work up to a goal of 150 minutes of combined cardio and strengthening exercise per week for weight loss and overall health benefits. We discussed the following Behavioral Modification Strategies today: emotional eating strategies   [name] has agreed to follow up with our clinic in 3 to 4 weeks. He was informed of the importance of frequent follow up visits to maximize his success with intensive lifestyle modifications for his multiple health conditions.  ALLERGIES: Allergies  Allergen Reactions  . Shrimp [Shellfish Allergy] Anaphylaxis and Rash  . Codeine Rash and Other (See Comments)    Blacked out  .  Contrast Media [Iodinated Diagnostic Agents] Rash  . Diazepam Rash    Rash over whole body from neck down, and he lost his voice (airway compromise?)  . Amoxicillin Other (See Comments)    Unknown   . Benadryl [Diphenhydramine]     With dyes   . Depakote [Divalproex Sodium] Swelling    Per pts report (given by Dr. Inda Merlin).  Has throat swelling and rash.  . Erythromycin Other (See Comments)    unknown  . Flexeril [Cyclobenzaprine]   . Allegra  [Fexofenadine] Rash       . Betadine [Povidone Iodine] Rash  . Celebrex [Celecoxib] Rash  . Meperidine And Related Rash  . Methocarbamol Rash  . Neosporin [Neomycin-Bacitracin Zn-Polymyx] Rash  . Oxycodone Rash  . Penicillins Rash  . Plavix [Clopidogrel] Rash  . Rivaroxaban Rash  . Vioxx [Rofecoxib] Rash    MEDICATIONS: Current Outpatient Medications on File Prior to Visit  Medication Sig Dispense Refill  . allopurinol (ZYLOPRIM) 100 MG tablet Take 1 tablet (100 mg total) by mouth daily. 90 tablet 3  . amitriptyline (ELAVIL) 100 MG tablet Take 1 tablet (100 mg total) by mouth at bedtime. 90 tablet 4  . aspirin 81 MG tablet Take 81 mg by mouth daily.    . Cholecalciferol (VITAMIN D PO) Take 1 tablet by mouth daily.    . Coenzyme Q10 (CO Q 10 PO) Take by mouth daily.    . Cyanocobalamin (VITAMIN B 12 PO) Take by mouth.    . divalproex (DEPAKOTE) 250 MG DR tablet Take 1 tablet (250 mg total) by mouth 2 (two) times daily. 180 tablet 6  . DULoxetine (CYMBALTA) 60 MG capsule Take 1 capsule (60 mg total) by mouth daily. 90 capsule 4  . EPINEPHrine 0.3 mg/0.3 mL IJ SOAJ injection INJECT 0.3 MLS INTO THE MUSCLE ONCE 2 mL 2  . Erenumab-aooe (AIMOVIG) 140 MG/ML SOAJ Inject 140 mg into the skin every 30 (thirty) days. 3 pen 1  . gabapentin (NEURONTIN) 300 MG capsule Take 1 capsule (300 mg total) by mouth 2 (two) times daily. 180 capsule 4  . Multiple Vitamin (MULTIVITAMIN) tablet Take 1 tablet by mouth daily.    . naproxen (NAPROSYN) 500 MG tablet Take 500 mg by mouth 2 (two) times daily with a meal.    . Omega-3 Fatty Acids (FISH OIL) 1000 MG CAPS Take 1 capsule by mouth daily.    . pravastatin (PRAVACHOL) 40 MG tablet Take 1 tablet (40 mg total) by mouth daily. 90 tablet 3  . Psyllium (METAMUCIL PO) Take 2 tablets by mouth 2 (two) times daily.    . Red Yeast Rice Extract (RED YEAST RICE PO) Take 1 tablet by mouth daily.    . SUMAtriptan (IMITREX) 100 MG tablet TAKE 1 TABLET BY MOUTH ONCE AS  NEEDED FOR MIGRAINE. MAY REPEAT IN 2 HOURS IF NEEDED. MAX 2 TABLETS PER 24 HOURS. 27 tablet 0  . tiZANidine (ZANAFLEX) 4 MG tablet Take 1 tablet (4 mg total) by mouth 3 (three) times daily as needed for muscle spasms. 270 tablet 4  . TURMERIC CURCUMIN PO Take 1,050 mg by mouth daily.    . valACYclovir (VALTREX) 500 MG tablet Take 500 mg by mouth daily.    . vitamin C (ASCORBIC ACID) 500 MG tablet Take 500 mg by mouth daily.    . vitamin E 1000 UNIT capsule Take 1,000 Units by mouth daily.     Current Facility-Administered Medications on File Prior to Visit  Medication Dose Route  Frequency Provider Last Rate Last Dose  . 0.9 %  sodium chloride infusion  500 mL Intravenous Once Ladene Artist, MD        PAST MEDICAL HISTORY: Past Medical History:  Diagnosis Date  . ALLERGIC RHINITIS 04/22/2007  . Allergy   . Cancer (HCC)    HX SKIN CANCER  . Complication of anesthesia    more concerned about positioning of head & neck because of TMJ  . DDD (degenerative disc disease), cervical   . GERD (gastroesophageal reflux disease)    infrequently  . Headache(784.0)    migraines  . History of kidney stones   . History of skin cancer   . History of TMJ disorder   . HYPERCHOLESTEROLEMIA 04/22/2007  . HYPERLIPIDEMIA 07/29/2007  . Hyperplastic colon polyp   . NEPHROLITHIASIS, HX OF 07/29/2007  . Osteoarth NOS-Unspec 04/22/2007  . Shortness of breath   . Shoulder pain, acute    bilateral    PAST SURGICAL HISTORY: Past Surgical History:  Procedure Laterality Date  . APPENDECTOMY    . BACK SURGERY  10/07/2015  . COLONOSCOPY     10 yrs ago- 2 HPP   . COLONOSCOPY  2019  . JOINT REPLACEMENT    . KNEE ARTHROSCOPY     right x2  . SHOULDER SURGERY     right  . skin cancer biopsy     L forehead  . TOTAL KNEE ARTHROPLASTY Right 08/25/2013   Procedure: RIGHT TOTAL KNEE ARTHROPLASTY;  Surgeon: Gearlean Alf, MD;  Location: [facility];  Service: Orthopedics;  Laterality: Right;  . TOTAL SHOULDER  ARTHROPLASTY Left 12/25/2013   DR SUPPLE   . TOTAL SHOULDER ARTHROPLASTY Left 12/25/2013   Procedure: LEFT TOTAL SHOULDER ARTHROPLASTY;  Surgeon: Marin Shutter, MD;  Location: [place];  Service: Orthopedics;  Laterality: Left;  . TOTAL SHOULDER ARTHROPLASTY Right 06/18/2014   DR SUPPLE  . TOTAL SHOULDER ARTHROPLASTY Right 06/18/2014   Procedure: RIGHT TOTAL SHOULDER ARTHROPLASTY;  Surgeon: Marin Shutter, MD;  Location: [place];  Service: Orthopedics;  Laterality: Right;  . WISDOM TOOTH EXTRACTION      SOCIAL HISTORY: Social History   Tobacco Use  . Smoking status: Never Smoker  . Smokeless tobacco: Never Used  Substance Use Topics  . Alcohol use: No    Alcohol/week: 0.0 standard drinks  . Drug use: No    FAMILY HISTORY: Family History  Problem Relation Age of Onset  . COPD Father        family hx  . Emphysema Father   . Aneurysm Mother   . Stroke Mother   . Sudden death Mother   . Cancer Mother   . Obesity Mother   . Breast cancer Mother   . Cancer Maternal Grandfather        lung  . Colon cancer Neg Hx   . Colon polyps Neg Hx     ROS: Review of Systems  Constitutional: Negative for weight loss.  Gastrointestinal: Negative for nausea and vomiting.  Musculoskeletal:       Negative muscle weakness  Endo/Heme/Allergies:       Negative hypoglycemia    PHYSICAL EXAM: Blood pressure 127/80, pulse 79, temperature 98.2 F (36.8 C), temperature source Oral, height 5\' 9"  (1.753 m), weight 225 lb (102.1 kg), SpO2 96 %. Body mass index is 33.23 kg/m. Physical Exam Vitals signs reviewed.  Constitutional:      Appearance: Normal appearance. He is obese.  Cardiovascular:     Rate and  Rhythm: Normal rate.     Pulses: Normal pulses.  Pulmonary:     Effort: Pulmonary effort is normal.     Breath sounds: Normal breath sounds.  Musculoskeletal: Normal range of motion.  Skin:    General: Skin is warm and dry.  Neurological:     Mental Status: He is alert and oriented to  person, place, and time.  Psychiatric:        Mood and Affect: Mood normal.        Behavior: Behavior normal.     RECENT LABS AND TESTS: BMET    Component Value Date/Time   NA 138 06/19/2019 1436   K 4.5 06/19/2019 1436   CL 101 06/19/2019 1436   CO2 23 06/19/2019 1436   GLUCOSE 102 (H) 06/19/2019 1436   GLUCOSE 94 07/31/2014 0920   GLUCOSE 94 08/20/2006 0830   BUN 29 (H) 06/19/2019 1436   CREATININE 1.21 06/19/2019 1436   CALCIUM 9.2 06/19/2019 1436   GFRNONAA 61 06/19/2019 1436   GFRAA 71 06/19/2019 1436   Lab Results  Component Value Date   HGBA1C 5.7 (H) 06/19/2019   HGBA1C 5.5 11/21/2017   HGBA1C 5.8 (H) 06/18/2017   Lab Results  Component Value Date   INSULIN 8.5 06/19/2019   INSULIN 8.6 11/21/2017   INSULIN 16.5 06/18/2017   CBC    Component Value Date/Time   WBC 5.5 06/18/2017 1047   WBC 5.7 07/31/2014 0920   RBC 4.60 06/18/2017 1047   RBC 4.53 07/31/2014 0920   HGB 15.7 06/18/2017 1047   HCT 45.0 06/18/2017 1047   PLT 313.0 07/31/2014 0920   MCV 98 (H) 06/18/2017 1047   MCH 34.1 (H) 06/18/2017 1047   MCH 31.8 06/10/2014 0955   MCHC 34.9 06/18/2017 1047   MCHC 33.4 07/31/2014 0920   RDW 14.3 06/18/2017 1047   LYMPHSABS 1.8 06/18/2017 1047   MONOABS 0.5 07/31/2014 0920   EOSABS 0.3 06/18/2017 1047   BASOSABS 0.1 06/18/2017 1047   Iron/TIBC/Ferritin/ %Sat No results found for: IRON, TIBC, FERRITIN, IRONPCTSAT Lipid Panel     Component Value Date/Time   CHOL 197 06/19/2019 1436   TRIG 77 06/19/2019 1436   TRIG 52 08/20/2006 0830   HDL 71 06/19/2019 1436   CHOLHDL 4 07/31/2014 0920   VLDL 24.4 07/31/2014 0920   LDLCALC 112 (H) 06/19/2019 1436   LDLDIRECT 135.0 07/16/2013 0937   Hepatic Function Panel     Component Value Date/Time   PROT 7.2 06/19/2019 1436   ALBUMIN 4.4 06/19/2019 1436   AST 45 (H) 06/19/2019 1436   ALT 41 06/19/2019 1436   ALKPHOS 55 06/19/2019 1436   BILITOT 0.4 06/19/2019 1436   BILIDIR 0.1 07/31/2014 0920       Component Value Date/Time   TSH 2.150 06/19/2019 1436   TSH 2.920 06/18/2017 1047   TSH 1.53 07/31/2014 0920      OBESITY BEHAVIORAL INTERVENTION VISIT  Today's visit was # 19   Starting weight: 230 lbs Starting date: 06/18/17 Today's weight : 225 lbs  Today's date: 07/10/2019 Total lbs lost to date: 5    ASK: We discussed the diagnosis of obesity with [name] today and [name] agreed to give [name] permission to discuss obesity behavioral modification therapy today.  ASSESS: Daylyn has the diagnosis of obesity and his BMI today is 33.21 Krishawn is in the action stage of change   ADVISE: Andranik was educated on the multiple health risks of obesity as well as the benefit  of weight loss to improve his health. He was advised of the need for long term treatment and the importance of lifestyle modifications to improve his current health and to decrease his risk of future health problems.  AGREE: Multiple dietary modification options and treatment options were discussed and  Omir agreed to follow the recommendations documented in the above note.  ARRANGE: Teague was educated on the importance of frequent visits to treat obesity as outlined per CMS and USPSTF guidelines and agreed to schedule his next follow up appointment today.  I, Trixie Dredge, am acting as transcriptionist for Dennard Nip, MD I have reviewed the above documentation for accuracy and completeness, and I agree with the above. -Dennard Nip, MD

## 2019-07-21 ENCOUNTER — Other Ambulatory Visit: Payer: Self-pay | Admitting: Neurology

## 2019-07-25 ENCOUNTER — Other Ambulatory Visit: Payer: Self-pay | Admitting: Family Medicine

## 2019-07-25 NOTE — Telephone Encounter (Signed)
Pt LOV was on 07/10/2019 and last refill was on 05/07/2019 for 27 tablets, please advise if ok to refill

## 2019-07-28 ENCOUNTER — Other Ambulatory Visit: Payer: Self-pay | Admitting: *Deleted

## 2019-07-28 DIAGNOSIS — G43711 Chronic migraine without aura, intractable, with status migrainosus: Secondary | ICD-10-CM

## 2019-07-28 MED ORDER — DULOXETINE HCL 60 MG PO CPEP: 60.0000 mg | ORAL_CAPSULE | Freq: Every day | ORAL | 3 refills | Status: DC

## 2019-07-28 NOTE — Telephone Encounter (Signed)
Received refill request from [facility] for Duloxetine DR 60 mg. Per Dr. Jaynee Eagles, ok to refill 90 day supply x 1 year. Refills sent per v.o.

## 2019-08-07 ENCOUNTER — Ambulatory Visit (INDEPENDENT_AMBULATORY_CARE_PROVIDER_SITE_OTHER): Payer: Medicare HMO | Admitting: Family Medicine

## 2019-08-13 ENCOUNTER — Encounter: Payer: Self-pay | Admitting: *Deleted

## 2019-08-13 ENCOUNTER — Telehealth (INDEPENDENT_AMBULATORY_CARE_PROVIDER_SITE_OTHER): Payer: Medicare HMO | Admitting: Neurology

## 2019-08-13 DIAGNOSIS — G43711 Chronic migraine without aura, intractable, with status migrainosus: Secondary | ICD-10-CM | POA: Diagnosis not present

## 2019-08-13 NOTE — Progress Notes (Signed)
[id number]    Provider:  Dr Jaynee Eagles Referring Provider: [name], MD Primary Care Physician:  [name], MD  CC:  Chronic Migraines  Interval history 08/13/2019: patient is having worsening migraines. Possibly the AImovig is wearing off, we discussed changing to Ajovy, he is very stressed due to covid and the election which is also exacerbating his migraines. He has tried not to watch TV. We tried in the past to change his medications and it did not work, he tried stopping the amitriptyline but his headaches worsened, he woul dlove to stop bc it is difficult to lose weight on it. He has lost 20 pounds and maintained, difficult to lose more. We decided to change to Ajovy, we can provide samples, he will come to the office to get them.     Virtual Visit via Video Note  I connected with [name] on 08/13/19 at  8:30 AM EST by a video enabled telemedicine application and verified that I am speaking with the correct person using two identifiers.  Location: Patient: home Provider: office   I discussed the limitations of evaluation and management by telemedicine and the availability of in person appointments. The patient expressed understanding and agreed to proceed.   I discussed the assessment and treatment plan with the patient. The patient was provided an opportunity to ask questions and all were answered. The patient agreed with the plan and demonstrated an understanding of the instructions.   The patient was advised to call back or seek an in-person evaluation if the symptoms worsen or if the condition fails to improve as anticipated.  I provided 25 minutes of non-face-to-face time during this encounter.   Melvenia Beam, MD    Interval history 12/02/2018: He is now using the cpap. He was snoring and very tired. He is still on Tizanidine, cymbalta, gabapentin and amitriptyline. He is improved on cpap, sleeping better and less migraines. He  is still on the [place]. He is doing better. Been using the cpap for 2 weeks. He is still having back pain which can cause the migraine sin the afternoon He went to the [facility], they didn't know why he has ringing in the ears. He stopped Naproxyn to see if maybe that was causing his symptoms. Gained 30 pounds - everything he lost he gained back. He went to Dr Leafy Ro and is trying to lose weight   Interval history 06/24/2018: he tried Topiramate. He had a terrible July and associated with his back pain. He had a great August because his back felt better. Only 2 minor migraines in August. We tried Topiramate and could not tolerate, tried coming off Amitriptyline and had worse headaches. He takes capsules Gabapentin generic twice daily.  He sees no difference between aimovig 140mg  qmonthly vs 70mg .   Interval history 03/20/2018: Here for follow up of migraines on Aimovig, Gralise, elavil, cymbalta, triptans, tizanidine. He was doing exceptionally well but he had a colonoscopy that worsened his back pain  And also worsened his migraines. He declines botox for migraine, have discussed with him a few times. He does not want botox. He is having difficulty with weight loss despite physical activity and a good diet. Advised TCAs can cause this as can gabapentin. Will try to decrease his amitriptyline. Then start Topiramate ER and decrease neurontin.  Reviewed headache diary: Total headache days a month:  January 2 February 16 March 17 Apil 5 May 5 June 9  Tried: Amitriptyline,  Topamax, Cymbalta, Rizatriptan, Aimovig, Melatonin, Coffe before bed (possibly hypnic headaches), Depakote (swelling allergy), Gralise, Tizanidine, Naproxen, flexeril, methocarbamol,   Interval history 11/20/2017: Since starting Gralise and aimovig he is doing extremely well. In January had 2 migraines, in February had 4 days of migraines but otherwise none. The migraines are not as long and not as intense. Reduced  intensity. We were having a lot of difficulty getting Aimovig approved, he spent hours on the phone. Gave him samples today. He is doing spectacularly on Aimovig. He takes Tizanidine and Amitriptyline at bedtime. He takes Gralise with food. He [facility], he lost 20 pounds. He has been walking, walking everyday. Continue one injection.   Interval history 07/23/2017:  CLAXTON LYU is a 68 y.o. male here as a referral from Dr. Volanda [name] for Migraines. They are improved on Amitriptyline, Gralise and Aimovig. The intensity and duration are improved as well as the frequency. Also helping him sleep. However making him more tired during the early part of the day. He has a "lot of pain" in other places around his body, pain in his right thumb and index finger, he has had multiple orthopaedic operations. They moved into a new house. He has back pain, knee pain and thumb pain. He carried a lot of boxes during the move which exacerbated his symptoms. He feels like his weight is not improving, discussed these medications have weight gain.  He saw [name] a month ago, Reviewed his migraine diary in detail, talked in length about his migraines which have decreased in frequency by 48% and 36.7% in frequency however his migraine severity has reduced by 50% as did it.   Interval history 04/26/2017: patient is following for nocturnal and morning headaches. Has been diagnosed with sleep apnea but refuses CPAP machine and despite discussions that this could be the cause of his headaches. These tried multiple medications as below, we've tried treatments for migraines as well as possibly hypnic headaches. We've tried the new CGRP monoclonal antibody Aimovig. He has had a migraine every day for the last 2 weeks. Happening at night about 1/2 the time and now more during the day associated with back pain. He is taking melatonin, amitriptyline, cymbalta and flexeril at night. Recommend  seeing Dr. Rexene Alberts to discuss sleep study in detail which showed sleep apnea.   Interval history 01/04/2017:Patient following up for morning headaches. MRI brain was normal. Patient has OSA diagnosed here at [facility] but he declined to be treated despite headaches waking him up at night. I have highly encouraged him to use a cpap as this may resolve his nocturnal and morning headaches and also warned of the sequelae of untreated sleep apnea such as increased risk of stroke. He takes tylenol PM every night, cymbalta, He is on Amitriptyline 75mg  at night. He has been to PT for his musculoskeletal neck pain.   Tried: Amitriptyline, Topamax, Cymbalta, Rizatriptan, Aimovig, Melatonin, Coffe before bed (possibly hypnic headaches), Depakote (swelling allergy), Gralise, Tizanidine  Has not tried: Depakote, propranolol, Indomethacin,   [id number] [name] a 68 y.o.malehere as a referral from Dr. Raynelle Chary migraines. PMHx migraines. He has had migraines/headachesfor 10 years. He travels a lot and has significant osteoarthritis. He has had shoulder replacements and back fusion of l4-l5. His skeletal issues are a migraine trigger. All his migraines are in the middle of the night, he wakes up with headaches 95% of the time. He has seen a lot of doctors. Sumatriptan helps  with the migraines. He has been to many doctors. He saw Dr. Melton Alar for headaches. He has daily headaches. He was taking Sumatriptan daily but now only 9 times a month and no medication overuse. Headaches start in the neck, feels like he is going to explode, all over the head, so severe he can't even look at things, painful pressure all over the head, in his eyes behind his eyes, stiffness and tense, his head is pounding, nausea, extreme light and sound sensitivity. He snores, he doesn't wake up from snoring. No aura. He will also get migraines during the day if he is not conscious of his posture. Migraines last 8 hours. He is terrified to go  to bed at night because he wakes up with headaches. No vision changes, speech changes, new weakness or sensory changes. No other focal neurologic deficits, associated symptoms, inciting events or modifiable factors.  Tried: Topiramate, Elavil, acetaminophen, imitrex   Reviewed notes, labs and imaging from outside physicians, which showed:  [facility] primary care [facility]. Patient has a history of migraines. Migraines have been refractory. These responded nicely to sumatriptan but his insurance company has switched to an alternate triptan and he is quite limited in his monthly supply. He is under considerable stress recently. He tried Topamax which she did not tolerate well and recently discontinued. He is previous s/p posterior lumbar interbody fusion at L4-L5 performed on 10-07-15 with noted degenerative changes at L3-L4 with collapse to the right and noted degenerative scoliosis. He has right low back pain and right hip pain that is constant and it hurts in any position. He denies any radicular pain or numbness or weakness. He has a progressive collapse of the disc space at L3-4 that is eccentric and now accentuating the lateral listhesis at L2-3. The MR shows obvious endplate inflammation as well so they will start with ESI efforts and see if this helps.If not, he will need an extension of his fusion with correciton of the deformity up to L2.Marland Kitchen  MRI LUMBAR SPINE WITHOUT CONTRAST, 08/16/2016 10:51 AM   INDICATION: LOW BACK PAIN, >6 WKS / RED FLAG(S) / RADICULOPATHY \ \ Z98.1 S/P lumbar fusion \ M54.5 Spine pain, lumbar   COMPARISON: Lumbar spine radiograph 07/18/2016 and CT myelogram 07/12/2015   TECHNIQUE: Multiplanar, multi-sequence surface-coil MR imaging of the lumbar spine was performed without contrast.   LEVELS IMAGED: Lower thoracic to upper sacral region.   FINDINGS:  Alignment: Similar grade 1 anterolisthesis of L4 and L5. Trace  retrolisthesis of L2 on L3, similar to prior.Similar levoscoliotic curvature of the spine. Vertebrae: There are fatty endplate degenerative changes at L4-L5. Increased T2/STIR signal within the endplates at [id number] and the inferior endplate of L2, compatible with edema/inflammation, likely degenerative in etiology. Redemonstrated postsurgical changes of posterior lumbar interbody fusion at L4-L5 with pedicle screw and rod internal fixation. Paraspinal soft tissues: Increased T2/STIR signal within the paraspinal musculature adjacent to the operative site, which is favored to reflect postsurgical changes. Conus: Normal in signal and position, terminating at the superior aspect of L2  T12-L1: No significant focal abnormality. L1-L2: No significant focal abnormality. L2-L3: Similar trace retrolisthesis of L2 on L3 with slight uncovering of the disc. There is a left eccentric disc bulge with disc desiccation and height loss. Facet hypertrophy with ligamentum flavum thickening and facet effusions bilaterally. There is mild bilateral foraminal narrowing L3-L4: There is marked narrowing of the right aspect of the disc space at L3-4 with surrounding  high STIR, low T1 signal compatible with edema, likely related to progressive degenerative changes at this level. Broad-based disc bulge with facet hypertrophy and ligamentum flavum thickening. There is moderate right lateral recess narrowing. There is moderate right foraminal narrowing and mild left foraminal narrowing. L4-L5: Grade one anterolisthesis of L4 and L5, with slight uncovering of the disc. There is facet hypertrophy and ligamentum flavum thickening without canal narrowing. There is soft tissue extending into the foramen on the right at L4-L5.  L5-S1: Left eccentric disc bulge. There is moderate left foraminal narrowing. No significant canal narrowing.    Review of Systems: Patient complains of symptoms per HPI as well as the following  symptoms:No CP, no SOB. Pertinent negatives per HPI. All others negative.    Social History   Socioeconomic History   Marital status: Married    Spouse name: Not on file   Number of children: 1   Years of education: 16+   Highest education level: Bachelor's degree (e.g., BA, AB, BS)  Occupational History   Occupation: Retired  Scientist, product/process development strain: Not on file   Food insecurity    Worry: Not on file    Inability: Not on Lexicographer needs    Medical: Not on file    Non-medical: Not on file  Tobacco Use   Smoking status: Never Smoker   Smokeless tobacco: Never Used  Substance and Sexual Activity   Alcohol use: No    Alcohol/week: 0.0 standard drinks   Drug use: No   Sexual activity: Yes    Partners: Female    Birth control/protection: None  Lifestyle   Physical activity    Days per week: Not on file    Minutes per session: Not on file   Stress: Not on file  Relationships   Social connections    Talks on phone: Not on file    Gets together: Not on file    Attends religious service: Not on file    Active member of club or organization: Not on file    Attends meetings of clubs or organizations: Not on file    Relationship status: Not on file   Intimate partner violence    Fear of current or ex partner: Not on file    Emotionally abused: Not on file    Physically abused: Not on file    Forced sexual activity: Not on file  Other Topics Concern   Not on file  Social History Narrative   Lives at home w/ his wife   Right-handed   Caffeine: 2 cups of coffee each morning    Family History  Problem Relation Age of Onset   COPD Father        family hx   Emphysema Father    Aneurysm Mother    Stroke Mother    Sudden death Mother    Cancer Mother    Obesity Mother    Breast cancer Mother    Cancer Maternal Grandfather        lung   Colon cancer Neg Hx    Colon polyps Neg Hx     Past Medical History:    Diagnosis Date   ALLERGIC RHINITIS 04/22/2007   Allergy    Cancer ([place])    HX SKIN CANCER   Complication of anesthesia    more concerned about positioning of head & neck because of TMJ   DDD (degenerative disc disease), cervical    GERD (gastroesophageal reflux disease)  infrequently   Headache(784.0)    migraines   History of kidney stones    History of skin cancer    History of TMJ disorder    HYPERCHOLESTEROLEMIA 04/22/2007   HYPERLIPIDEMIA 07/29/2007   Hyperplastic colon polyp    NEPHROLITHIASIS, HX OF 07/29/2007   Osteoarth NOS-Unspec 04/22/2007   Shortness of breath    Shoulder pain, acute    bilateral    Past Surgical History:  Procedure Laterality Date   APPENDECTOMY     BACK SURGERY  10/07/2015   COLONOSCOPY     10 yrs ago- 2 HPP    COLONOSCOPY  2019   JOINT REPLACEMENT     KNEE ARTHROSCOPY     right x2   SHOULDER SURGERY     right   skin cancer biopsy     L forehead   TOTAL KNEE ARTHROPLASTY Right 08/25/2013   Procedure: RIGHT TOTAL KNEE ARTHROPLASTY;  Surgeon: Gearlean Alf, MD;  Location: [facility];  Service: Orthopedics;  Laterality: Right;   TOTAL SHOULDER ARTHROPLASTY Left 12/25/2013   DR SUPPLE    TOTAL SHOULDER ARTHROPLASTY Left 12/25/2013   Procedure: LEFT TOTAL SHOULDER ARTHROPLASTY;  Surgeon: Marin Shutter, MD;  Location: [place];  Service: Orthopedics;  Laterality: Left;   TOTAL SHOULDER ARTHROPLASTY Right 06/18/2014   DR SUPPLE   TOTAL SHOULDER ARTHROPLASTY Right 06/18/2014   Procedure: RIGHT TOTAL SHOULDER ARTHROPLASTY;  Surgeon: Marin Shutter, MD;  Location: [place];  Service: Orthopedics;  Laterality: Right;   WISDOM TOOTH EXTRACTION      Current Outpatient Medications  Medication Sig Dispense Refill   allopurinol (ZYLOPRIM) 100 MG tablet Take 1 tablet (100 mg total) by mouth daily. 90 tablet 3   amitriptyline (ELAVIL) 100 MG tablet TAKE 1 TABLET BY MOUTH AT BEDTIME 90 tablet 0   aspirin 81 MG tablet Take 81 mg  by mouth daily.     Cholecalciferol (VITAMIN D PO) Take 1 tablet by mouth daily.     Coenzyme Q10 (CO Q 10 PO) Take by mouth daily.     Cyanocobalamin (VITAMIN B 12 PO) Take by mouth.     divalproex (DEPAKOTE) 250 MG DR tablet Take 1 tablet (250 mg total) by mouth 2 (two) times daily. 180 tablet 6   DULoxetine (CYMBALTA) 60 MG capsule Take 1 capsule (60 mg total) by mouth daily. 90 capsule 3   EPINEPHrine 0.3 mg/0.3 mL IJ SOAJ injection INJECT 0.3 MLS INTO THE MUSCLE ONCE 2 mL 2   gabapentin (NEURONTIN) 300 MG capsule Take 1 capsule (300 mg total) by mouth 2 (two) times daily. 180 capsule 4   Multiple Vitamin (MULTIVITAMIN) tablet Take 1 tablet by mouth daily.     naproxen (NAPROSYN) 500 MG tablet Take 500 mg by mouth 2 (two) times daily with a meal.     Omega-3 Fatty Acids (FISH OIL) 1000 MG CAPS Take 1 capsule by mouth daily.     pravastatin (PRAVACHOL) 40 MG tablet Take 1 tablet (40 mg total) by mouth daily. 90 tablet 3   Psyllium (METAMUCIL PO) Take 2 tablets by mouth 2 (two) times daily.     Red Yeast Rice Extract (RED YEAST RICE PO) Take 1 tablet by mouth daily.     SUMAtriptan (IMITREX) 100 MG tablet TAKE 1 TABLET BY MOUTH ONCE AS NEEDED FOR MIGRAINE. MAY REPEAT IN 2 HOURS IF NEEDED. MAX 2 TABLETS PER 24 HOURS. 27 tablet 0   tiZANidine (ZANAFLEX) 4 MG tablet Take 1 tablet (4 mg  total) by mouth 3 (three) times daily as needed for muscle spasms. 270 tablet 4   TURMERIC CURCUMIN PO Take 1,050 mg by mouth daily.     valACYclovir (VALTREX) 500 MG tablet Take 500 mg by mouth daily.     vitamin C (ASCORBIC ACID) 500 MG tablet Take 500 mg by mouth daily.     vitamin E 1000 UNIT capsule Take 1,000 Units by mouth daily.     Current Facility-Administered Medications  Medication Dose Route Frequency Provider Last Rate Last Dose   0.9 %  sodium chloride infusion  500 mL Intravenous Once Ladene Artist, MD        Allergies as of 08/13/2019 - Review Complete 08/13/2019    Allergen Reaction Noted   Shrimp [shellfish allergy] Anaphylaxis and Rash 05/22/2012   Codeine Rash and Other (See Comments)    Contrast media [iodinated diagnostic agents] Rash 11/11/2015   Diazepam Rash 06/26/2014   Amoxicillin Other (See Comments)    Benadryl [diphenhydramine]  10/23/2017   Depakote [divalproex sodium] Swelling 07/30/2017   Erythromycin Other (See Comments)    Flexeril [cyclobenzaprine]  10/23/2017   Allegra [fexofenadine] Rash 01/16/2019   Betadine [povidone iodine] Rash 05/22/2012   Celebrex [celecoxib] Rash 05/22/2012   Meperidine and related Rash 05/22/2012   Methocarbamol Rash 05/22/2012   Neosporin [neomycin-bacitracin zn-polymyx] Rash 10/23/2017   Oxycodone Rash 10/14/2015   Penicillins Rash    Plavix [clopidogrel] Rash 12/11/2013   Rivaroxaban Rash 10/20/2013   Vioxx [rofecoxib] Rash 05/22/2012    Vitals: There were no vitals taken for this visit. Last Weight:  Wt Readings from Last 1 Encounters:  07/10/19 225 lb (102.1 kg)   Last Height:   Ht Readings from Last 1 Encounters:  07/10/19 5\' 9"  (1.753 m)   Physical exam: Exam: Gen: NAD, conversant, well nourised, obese, well groomed                     CV: RRR, no MRG. No Carotid Bruits. No peripheral edema, warm, nontender Eyes: Conjunctivae clear without exudates or hemorrhage  Neuro: Detailed Neurologic Exam  Speech:    Speech is normal; fluent and spontaneous with normal comprehension.  Cognition:    The patient is oriented to person, place, and time;     recent and remote memory intact;     language fluent;     normal attention, concentration,     fund of knowledge Cranial Nerves:    The pupils are equal, round, and reactive to light. The fundi are normal and spontaneous venous pulsations are present. Visual fields are full to finger confrontation. Extraocular movements are intact. Trigeminal sensation is intact and the muscles of mastication are normal. The face  is symmetric. The palate elevates in the midline. Hearing intact. Voice is normal. Shoulder shrug is normal. The tongue has normal motion without fasciculations.   Coordination:    Normal finger to nose and heel to shin. Normal rapid alternating movements.   Gait:    Heel-toe and tandem gait are normal.   Motor Observation:    No asymmetry, no atrophy, and no involuntary movements noted. Tone:    Normal muscle tone.    Posture:    Posture is normal. normal erect    Strength:    Strength is V/V in the upper and lower limbs.      Sensation: intact to LT     Reflex Exam:  DTR's:    Deep tendon reflexes in the upper and lower extremities  are normal bilaterally.   Toes:    The toes are downgoing bilaterally.   Clonus:    Clonus is absent.        Assessment/Plan:68 year old with chronic migraines. Significantly improved on Aimovig but worsened the last few months in the setting of back pain and stress.  - migraines which may be improving with the cpap, had long discussion with patient, will change to Ajovy - [facility] He is struggling. His TCA and gabapentin may have something to do with his struggles, we tried to transition to [place] but did not tolerate continue capsules gabapentin IR, tried to come off of Amitriptyline but oculd not so continue  Amitriptyline 100, Duloxetine 60. He has follow up with [name] and is trying again.   - Tried Depakote for migraine prevention, did not tolerate (swelling)  - Continue Cpap, feels better,  Tinnitus: Went to [facility], unclear reason why he has it and continues without improvement  - also discussed botox for migraines again, he will consider  Previous A/P:  Currently on 4 migraine preventatives: Cymbalta, Amitriptyline, Gralise and Aimovig. Has failed Topiramate (Tried: Amitriptyline, Topamax, Cymbalta, Rizatriptan, Aimovig, Melatonin, Coffe before bed (possibly hypnic headaches), : Tried:  Amitriptyline, Topamax, Cymbalta, Rizatriptan, Aimovig, Melatonin, Coffe before bed (possibly hypnic headaches), Depakote (swelling, did not tolerate), Gralise, Tizanidine  Declines botox for migraines  Acute medication for migraines: Continue Sumatriptan/rizatriptan. Discussed medication overuse and not to take acute medications more than 10 days a month for migraines.   MRI brain wo contrast: normal  He has been to [facility] and did not have a good experience, declines further PT  Tinnitus. May have coincided with taking Naproxen, asked him to stop Naproxen. [facility] for Tinnitus and they could not find anything   Sarina Ill, MD  [facility] [address] [place], [place] [address]  Phone [phone] Fax [phone]  A total of 25 minutes was spent face-to-face with this patient. Over half this time was spent on counseling patient on the  No diagnosis found.   diagnosis and different diagnostic and therapeutic options available.

## 2019-08-14 ENCOUNTER — Encounter: Payer: Self-pay | Admitting: *Deleted

## 2019-08-18 ENCOUNTER — Ambulatory Visit (INDEPENDENT_AMBULATORY_CARE_PROVIDER_SITE_OTHER): Payer: Self-pay | Admitting: *Deleted

## 2019-08-18 ENCOUNTER — Ambulatory Visit (INDEPENDENT_AMBULATORY_CARE_PROVIDER_SITE_OTHER): Payer: Medicare HMO | Admitting: Family Medicine

## 2019-08-18 ENCOUNTER — Other Ambulatory Visit: Payer: Self-pay

## 2019-08-18 DIAGNOSIS — Z0289 Encounter for other administrative examinations: Secondary | ICD-10-CM

## 2019-08-18 DIAGNOSIS — Z79899 Other long term (current) drug therapy: Secondary | ICD-10-CM

## 2019-08-18 MED ORDER — AJOVY 225 MG/1.5ML ~~LOC~~ SOAJ: 225.0000 mg | SUBCUTANEOUS | 0 refills | Status: DC

## 2019-08-18 NOTE — Progress Notes (Signed)
Patient came by office and was given Ajovy 225 mg sample pens x 6 to take home for use starting 09/26/19 after finishing Aimovig supply. Pt understands the administration instructions and that the medication needs to stay refrigerated. He used the Personnel officer device as well. He was given a pamphlet about the medication and a copy of his med/allergy sheet with the Ajovy order. His questions were answered and he verbalized appreciation.

## 2019-08-18 NOTE — Telephone Encounter (Signed)
Ajovy sample order for 6 pens placed per v.o. Dr. Jaynee Eagles.

## 2019-08-25 ENCOUNTER — Encounter (INDEPENDENT_AMBULATORY_CARE_PROVIDER_SITE_OTHER): Payer: Self-pay | Admitting: Family Medicine

## 2019-08-25 NOTE — Telephone Encounter (Signed)
FYI

## 2019-08-26 ENCOUNTER — Encounter (INDEPENDENT_AMBULATORY_CARE_PROVIDER_SITE_OTHER): Payer: Self-pay | Admitting: Family Medicine

## 2019-08-27 ENCOUNTER — Telehealth (INDEPENDENT_AMBULATORY_CARE_PROVIDER_SITE_OTHER): Payer: Medicare HMO | Admitting: Family Medicine

## 2019-08-27 ENCOUNTER — Encounter (INDEPENDENT_AMBULATORY_CARE_PROVIDER_SITE_OTHER): Payer: Self-pay | Admitting: Family Medicine

## 2019-08-27 ENCOUNTER — Other Ambulatory Visit: Payer: Self-pay

## 2019-08-27 DIAGNOSIS — E669 Obesity, unspecified: Secondary | ICD-10-CM | POA: Diagnosis not present

## 2019-08-27 DIAGNOSIS — G43109 Migraine with aura, not intractable, without status migrainosus: Secondary | ICD-10-CM | POA: Diagnosis not present

## 2019-08-27 DIAGNOSIS — Z6833 Body mass index (BMI) 33.0-33.9, adult: Secondary | ICD-10-CM

## 2019-08-27 NOTE — Progress Notes (Signed)
Office: [phone]  /  Fax: [phone] TeleHealth Visit:  [name] [name] has verbally consented to this TeleHealth visit today. The patient is located at home, the provider is located at the [employer] [facility] office. The participants in this visit include the listed provider and patient. The visit was conducted today via FaceTime.  HPI:   Chief Complaint: OBESITY [name] [name] is here to discuss his progress with his obesity treatment plan. He is on the Category 3 plan and is following his eating plan approximately 50% of the time. He states he is walking an average of 4.5 miles daily.  [name] [name] states he has gained 2 lbs since our last visit. He struggled to follow the Category 3 plan and his average daily calories were approximately 2400. He had some sugar cravings for about 3 days and his ice cream intake increased, but he was able to get it back under control relatively quickly.  We were unable to weigh the patient today for this TeleHealth visit. He feels as if he has gained weight since his last visit. He has lost 5 lbs since starting treatment with [name].  Migraine Headache [name] [name] is on Aimovig but feels it runs out too quickly. He will be changed to Ajovy next year. He notes feeling tired and has increased appetite when he takes his sumatriptan, so he is avoiding taking this unless he truly needs it.  ASSESSMENT AND PLAN:  Migraine with aura and without status migrainosus, not intractable  Class 1 obesity with serious comorbidity and body mass index (BMI) of 33.0 to 33.9 in adult, unspecified obesity type  PLAN:  Migraine Headache [name] [name] will continue his migraine medications as is, eventually though they might encourage weight gain, the benefits outweigh the negatives.  I spent > than 50% of the 20 minute visit on counseling as documented in the note.  TIME SPENT: 26 minutes  Obesity [name] [name] is currently in the action stage of change. As such, his goal is to continue with  weight loss efforts. He has agreed to follow the Category 3 plan. [name] [name] has been instructed to work up to a goal of 150 minutes of combined cardio and strengthening exercise per week for weight loss and overall health benefits. We discussed the following Behavioral Modification Strategies today: decreasing simple carbohydrates and holiday eating strategies.  [name] [name] has agreed to follow-up with our clinic in 4 weeks. He was informed of the importance of frequent follow-up visits to maximize his success with intensive lifestyle modifications for his multiple health conditions.  ALLERGIES: Allergies  Allergen Reactions  . Shrimp [Shellfish Allergy] Anaphylaxis and Rash  . Codeine Rash and Other (See Comments)    Blacked out  . Contrast Media [Iodinated Diagnostic Agents] Rash  . Diazepam Rash    Rash over whole body from neck down, and he lost his voice (airway compromise?)  . Amoxicillin Other (See Comments)    Unknown   . Benadryl [Diphenhydramine]     With dyes   . Depakote [Divalproex Sodium] Swelling    Per pts report (given by Dr. Inda Merlin).  Has throat swelling and rash.  . Erythromycin Other (See Comments)    unknown  . Flexeril [Cyclobenzaprine]   . Allegra [Fexofenadine] Rash       . Betadine [Povidone Iodine] Rash  . Celebrex [Celecoxib] Rash  . Meperidine And Related Rash  . Methocarbamol Rash  . Neosporin [Neomycin-Bacitracin Zn-Polymyx] Rash  . Oxycodone Rash  . Penicillins Rash  . Plavix [Clopidogrel] Rash  .  Rivaroxaban Rash  . Vioxx [Rofecoxib] Rash    MEDICATIONS: Current Outpatient Medications on File Prior to Visit  Medication Sig Dispense Refill  . allopurinol (ZYLOPRIM) 100 MG tablet Take 1 tablet (100 mg total) by mouth daily. 90 tablet 3  . amitriptyline (ELAVIL) 100 MG tablet TAKE 1 TABLET BY MOUTH AT BEDTIME 90 tablet 0  . aspirin 81 MG tablet Take 81 mg by mouth daily.    . Cholecalciferol (VITAMIN D PO) Take 1 tablet by mouth daily.    .  Coenzyme Q10 (CO Q 10 PO) Take by mouth daily.    . Cyanocobalamin (VITAMIN B 12 PO) Take by mouth.    . divalproex (DEPAKOTE) 250 MG DR tablet Take 1 tablet (250 mg total) by mouth 2 (two) times daily. 180 tablet 6  . DULoxetine (CYMBALTA) 60 MG capsule Take 1 capsule (60 mg total) by mouth daily. 90 capsule 3  . EPINEPHrine 0.3 mg/0.3 mL IJ SOAJ injection INJECT 0.3 MLS INTO THE MUSCLE ONCE 2 mL 2  . Fremanezumab-vfrm (AJOVY) 225 MG/1.5ML SOAJ Inject 225 mg into the skin every 30 (thirty) days. 6 pen 0  . gabapentin (NEURONTIN) 300 MG capsule Take 1 capsule (300 mg total) by mouth 2 (two) times daily. 180 capsule 4  . Multiple Vitamin (MULTIVITAMIN) tablet Take 1 tablet by mouth daily.    . naproxen (NAPROSYN) 500 MG tablet Take 500 mg by mouth 2 (two) times daily with a meal.    . Omega-3 Fatty Acids (FISH OIL) 1000 MG CAPS Take 1 capsule by mouth daily.    . pravastatin (PRAVACHOL) 40 MG tablet Take 1 tablet (40 mg total) by mouth daily. 90 tablet 3  . Psyllium (METAMUCIL PO) Take 2 tablets by mouth 2 (two) times daily.    . Red Yeast Rice Extract (RED YEAST RICE PO) Take 1 tablet by mouth daily.    . SUMAtriptan (IMITREX) 100 MG tablet TAKE 1 TABLET BY MOUTH ONCE AS NEEDED FOR MIGRAINE. MAY REPEAT IN 2 HOURS IF NEEDED. MAX 2 TABLETS PER 24 HOURS. 27 tablet 0  . tiZANidine (ZANAFLEX) 4 MG tablet Take 1 tablet (4 mg total) by mouth 3 (three) times daily as needed for muscle spasms. 270 tablet 4  . TURMERIC CURCUMIN PO Take 1,050 mg by mouth daily.    . valACYclovir (VALTREX) 500 MG tablet Take 500 mg by mouth daily.    . vitamin C (ASCORBIC ACID) 500 MG tablet Take 500 mg by mouth daily.    . vitamin E 1000 UNIT capsule Take 1,000 Units by mouth daily.     Current Facility-Administered Medications on File Prior to Visit  Medication Dose Route Frequency Provider Last Rate Last Dose  . 0.9 %  sodium chloride infusion  500 mL Intravenous Once Ladene Artist, MD        PAST MEDICAL HISTORY:  Past Medical History:  Diagnosis Date  . ALLERGIC RHINITIS 04/22/2007  . Allergy   . Cancer (HCC)    HX SKIN CANCER  . Complication of anesthesia    more concerned about positioning of head & neck because of TMJ  . DDD (degenerative disc disease), cervical   . GERD (gastroesophageal reflux disease)    infrequently  . Headache(784.0)    migraines  . History of kidney stones   . History of skin cancer   . History of TMJ disorder   . HYPERCHOLESTEROLEMIA 04/22/2007  . HYPERLIPIDEMIA 07/29/2007  . Hyperplastic colon polyp   . NEPHROLITHIASIS, HX  OF 07/29/2007  . Osteoarth NOS-Unspec 04/22/2007  . Shortness of breath   . Shoulder pain, acute    bilateral    PAST SURGICAL HISTORY: Past Surgical History:  Procedure Laterality Date  . APPENDECTOMY    . BACK SURGERY  10/07/2015  . COLONOSCOPY     10 yrs ago- 2 HPP   . COLONOSCOPY  2019  . JOINT REPLACEMENT    . KNEE ARTHROSCOPY     right x2  . SHOULDER SURGERY     right  . skin cancer biopsy     L forehead  . TOTAL KNEE ARTHROPLASTY Right 08/25/2013   Procedure: RIGHT TOTAL KNEE ARTHROPLASTY;  Surgeon: Gearlean Alf, MD;  Location: [facility];  Service: Orthopedics;  Laterality: Right;  . TOTAL SHOULDER ARTHROPLASTY Left 12/25/2013   DR SUPPLE   . TOTAL SHOULDER ARTHROPLASTY Left 12/25/2013   Procedure: LEFT TOTAL SHOULDER ARTHROPLASTY;  Surgeon: Marin Shutter, MD;  Location: [facility];  Service: Orthopedics;  Laterality: Left;  . TOTAL SHOULDER ARTHROPLASTY Right 06/18/2014   DR SUPPLE  . TOTAL SHOULDER ARTHROPLASTY Right 06/18/2014   Procedure: RIGHT TOTAL SHOULDER ARTHROPLASTY;  Surgeon: Marin Shutter, MD;  Location: [facility];  Service: Orthopedics;  Laterality: Right;  . WISDOM TOOTH EXTRACTION      SOCIAL HISTORY: Social History   Tobacco Use  . Smoking status: Never Smoker  . Smokeless tobacco: Never Used  Substance Use Topics  . Alcohol use: No    Alcohol/week: 0.0 standard drinks  . Drug use: No    FAMILY HISTORY:  Family History  Problem Relation Age of Onset  . COPD Father        family hx  . Emphysema Father   . Aneurysm Mother   . Stroke Mother   . Sudden death Mother   . Cancer Mother   . Obesity Mother   . Breast cancer Mother   . Cancer Maternal Grandfather        lung  . Colon cancer Neg Hx   . Colon polyps Neg Hx    ROS: Review of Systems  Neurological: Positive for headaches (migraine).   PHYSICAL EXAM: Pt in no acute distress  RECENT LABS AND TESTS: BMET    Component Value Date/Time   NA 138 06/19/2019 1436   K 4.5 06/19/2019 1436   CL 101 06/19/2019 1436   CO2 23 06/19/2019 1436   GLUCOSE 102 (H) 06/19/2019 1436   GLUCOSE 94 07/31/2014 0920   GLUCOSE 94 08/20/2006 0830   BUN 29 (H) 06/19/2019 1436   CREATININE 1.21 06/19/2019 1436   CALCIUM 9.2 06/19/2019 1436   GFRNONAA 61 06/19/2019 1436   GFRAA 71 06/19/2019 1436   Lab Results  Component Value Date   HGBA1C 5.7 (H) 06/19/2019   HGBA1C 5.5 11/21/2017   HGBA1C 5.8 (H) 06/18/2017   Lab Results  Component Value Date   INSULIN 8.5 06/19/2019   INSULIN 8.6 11/21/2017   INSULIN 16.5 06/18/2017   CBC    Component Value Date/Time   WBC 5.5 06/18/2017 1047   WBC 5.7 07/31/2014 0920   RBC 4.60 06/18/2017 1047   RBC 4.53 07/31/2014 0920   HGB 15.7 06/18/2017 1047   HCT 45.0 06/18/2017 1047   PLT 313.0 07/31/2014 0920   MCV 98 (H) 06/18/2017 1047   MCH 34.1 (H) 06/18/2017 1047   MCH 31.8 06/10/2014 0955   MCHC 34.9 06/18/2017 1047   MCHC 33.4 07/31/2014 0920   RDW 14.3 06/18/2017 1047  LYMPHSABS 1.8 06/18/2017 1047   MONOABS 0.5 07/31/2014 0920   EOSABS 0.3 06/18/2017 1047   BASOSABS 0.1 06/18/2017 1047   Iron/TIBC/Ferritin/ %Sat No results found for: IRON, TIBC, FERRITIN, IRONPCTSAT Lipid Panel     Component Value Date/Time   CHOL 197 06/19/2019 1436   TRIG 77 06/19/2019 1436   TRIG 52 08/20/2006 0830   HDL 71 06/19/2019 1436   CHOLHDL 4 07/31/2014 0920   VLDL 24.4 07/31/2014 0920    LDLCALC 112 (H) 06/19/2019 1436   LDLDIRECT 135.0 07/16/2013 0937   Hepatic Function Panel     Component Value Date/Time   PROT 7.2 06/19/2019 1436   ALBUMIN 4.4 06/19/2019 1436   AST 45 (H) 06/19/2019 1436   ALT 41 06/19/2019 1436   ALKPHOS 55 06/19/2019 1436   BILITOT 0.4 06/19/2019 1436   BILIDIR 0.1 07/31/2014 0920      Component Value Date/Time   TSH 2.150 06/19/2019 1436   TSH 2.920 06/18/2017 1047   TSH 1.53 07/31/2014 0920   Results for Fusilier, Tavion S "DAVE" (MRN [id number]) as of 08/27/2019 11:09  Ref. Range 06/19/2019 14:36  Vitamin D, 25-Hydroxy Latest Ref Range: 30.0 - 100.0 ng/mL 78.1   I, Michaelene Song, am acting as Location manager for Dennard Nip, MD I have reviewed the above documentation for accuracy and completeness, and I agree with the above. -Dennard Nip, MD

## 2019-09-01 DIAGNOSIS — G4733 Obstructive sleep apnea (adult) (pediatric): Secondary | ICD-10-CM | POA: Diagnosis not present

## 2019-10-01 ENCOUNTER — Telehealth (INDEPENDENT_AMBULATORY_CARE_PROVIDER_SITE_OTHER): Payer: Medicare HMO | Admitting: Family Medicine

## 2019-10-08 ENCOUNTER — Other Ambulatory Visit: Payer: Self-pay | Admitting: *Deleted

## 2019-10-08 ENCOUNTER — Encounter: Payer: Self-pay | Admitting: Family Medicine

## 2019-10-08 MED ORDER — SUMATRIPTAN SUCCINATE 100 MG PO TABS: ORAL_TABLET | ORAL | 0 refills | Status: DC

## 2019-10-08 NOTE — Telephone Encounter (Signed)
Ok to refill Sumatriptan per Dr. Jaynee Eagles. Inadvertently sent to [facility]. I canceled it, spoke with Velva Harman. Refill sent to [facility].

## 2019-10-09 NOTE — Telephone Encounter (Signed)
Spoke with pt verbalized understanding that our clinic is not giving COVID-19 VACCINE. Pt given information on where to go get the vaccine.

## 2019-10-16 MED ORDER — SUMATRIPTAN SUCCINATE 100 MG PO TABS: 100.0000 mg | ORAL_TABLET | ORAL | 0 refills | Status: DC | PRN

## 2019-10-16 NOTE — Telephone Encounter (Signed)
Received fax from [facility] seeking clarification on Sumatriptan directions. Spoke with Dr. Jaynee Eagles and sent in new refill with clarification.

## 2019-10-16 NOTE — Addendum Note (Signed)
Addended by: Gildardo Griffes on: 10/16/2019 09:11 AM   Modules accepted: Orders

## 2019-10-17 ENCOUNTER — Other Ambulatory Visit: Payer: Self-pay | Admitting: *Deleted

## 2019-10-17 MED ORDER — AMITRIPTYLINE HCL 100 MG PO TABS: 100.0000 mg | ORAL_TABLET | Freq: Every day | ORAL | 3 refills | Status: DC

## 2019-10-17 NOTE — Telephone Encounter (Signed)
Per Dr. Jaynee Eagles, ok to fill Amitriptyline for 1 year, 3 months supply.

## 2019-10-23 ENCOUNTER — Other Ambulatory Visit: Payer: Self-pay | Admitting: Neurology

## 2019-10-24 ENCOUNTER — Ambulatory Visit: Payer: Medicare HMO

## 2019-11-01 ENCOUNTER — Ambulatory Visit: Payer: Medicare HMO

## 2019-11-07 DIAGNOSIS — G4733 Obstructive sleep apnea (adult) (pediatric): Secondary | ICD-10-CM | POA: Diagnosis not present

## 2019-11-14 ENCOUNTER — Ambulatory Visit: Payer: Medicare HMO

## 2019-12-02 ENCOUNTER — Encounter: Payer: Self-pay | Admitting: Adult Health

## 2019-12-02 ENCOUNTER — Telehealth: Payer: Self-pay | Admitting: Neurology

## 2019-12-02 ENCOUNTER — Encounter: Payer: Self-pay | Admitting: Neurology

## 2019-12-02 NOTE — Telephone Encounter (Signed)
Mychart message has been sent to the pt.

## 2019-12-02 NOTE — Telephone Encounter (Signed)
Please call patient and advise him that I have reviewed his CPAP compliance data and his MyChart message.  His sleep apnea scores are good, his apnea is under good control on the AutoPap settings.  I would not recommend a pressure increase as his average pressure is not as high as the maximum pressure setting, I would encourage patient to be consistent with his AutoPap and try to achieve 7 to 8 hours of sleep.  From my end of things, there is no change recommended. If he has discomfort with the interface/mass, I would recommend he talk to his DME company about a change in his mask or a mask refit appt.

## 2019-12-02 NOTE — Telephone Encounter (Addendum)
Duplicate

## 2019-12-05 DIAGNOSIS — G4733 Obstructive sleep apnea (adult) (pediatric): Secondary | ICD-10-CM | POA: Diagnosis not present

## 2019-12-17 ENCOUNTER — Telehealth (INDEPENDENT_AMBULATORY_CARE_PROVIDER_SITE_OTHER): Payer: Medicare HMO | Admitting: Family Medicine

## 2019-12-17 ENCOUNTER — Encounter (INDEPENDENT_AMBULATORY_CARE_PROVIDER_SITE_OTHER): Payer: Self-pay | Admitting: Family Medicine

## 2019-12-17 ENCOUNTER — Other Ambulatory Visit: Payer: Self-pay

## 2019-12-17 DIAGNOSIS — F3289 Other specified depressive episodes: Secondary | ICD-10-CM | POA: Diagnosis not present

## 2019-12-17 DIAGNOSIS — Z6833 Body mass index (BMI) 33.0-33.9, adult: Secondary | ICD-10-CM | POA: Diagnosis not present

## 2019-12-17 DIAGNOSIS — E669 Obesity, unspecified: Secondary | ICD-10-CM

## 2019-12-17 NOTE — Progress Notes (Signed)
TeleHealth Visit:  Due to the COVID-19 pandemic, this visit was completed with telemedicine (audio/video) technology to reduce patient and provider exposure as well as to preserve personal protective equipment.   [name] [name] has verbally consented to this TeleHealth visit. The patient is located at home, the provider is located at the [employer] [facility] office. The participants in this visit include the listed provider and patient. The visit was conducted today via face time.   Chief Complaint: OBESITY [name] [name] is here to discuss his progress with his obesity treatment plan along with follow-up of his obesity related diagnoses. [name] [name] is on the Category 3 Plan and states he is following his eating plan approximately 0% of the time. [name] [name] states he is walking for 4 miles 7 times per week.  Today's visit was #: 21 Starting weight: 230 lbs Starting date: 06/18/2017  Interim History: [name] [name] has struggled with continuing weight gain, and he states his weight is up to 237.8 lbs now. He notes increased emotional eating and his sugar and calories have gone up significantly, often 4,000 calories per day. He states he is ready to get back on track.  Subjective:   1. Other depression [name] [name]'s mood has decreased during the pandemic and he notes increased emotional eating and using sugar for comfort. He has gained weight back but states he is ready to concentrate on his health.  Assessment/Plan:   1. Other depression Behavior modification techniques were discussed today to help [name] [name] deal with his emotional/non-hunger eating behaviors. [name] [name] will continue to monitor his progress. Orders and follow up as documented in patient record.   2. Class 1 obesity with serious comorbidity and body mass index (BMI) of 33.0 to 33.9 in adult, unspecified obesity type [name] [name] is currently in the action stage of change. As such, his goal is to get back to weightloss efforts . He has agreed to keeping a food journal and  adhering to recommended goals of 2000 calories and 120+ grams of protein daily.   Exercise goals: As is.  Behavioral modification strategies: decreasing simple carbohydrates, emotional eating strategies and keeping a strict food journal.  [name] [name] has agreed to follow-up with our clinic in 2 months with myself. He was informed of the importance of frequent follow-up visits to maximize his success with intensive lifestyle modifications for his multiple health conditions.  Objective:   VITALS: Per patient if applicable, see vitals. GENERAL: Alert and in no acute distress. CARDIOPULMONARY: No increased WOB. Speaking in clear sentences.  PSYCH: Pleasant and cooperative. Speech normal rate and rhythm. Affect is appropriate. Insight and judgement are appropriate. Attention is focused, linear, and appropriate.  NEURO: Oriented as arrived to appointment on time with no prompting.   Lab Results  Component Value Date   CREATININE 1.21 06/19/2019   BUN 29 (H) 06/19/2019   NA 138 06/19/2019   K 4.5 06/19/2019   CL 101 06/19/2019   CO2 23 06/19/2019   Lab Results  Component Value Date   ALT 41 06/19/2019   AST 45 (H) 06/19/2019   ALKPHOS 55 06/19/2019   BILITOT 0.4 06/19/2019   Lab Results  Component Value Date   HGBA1C 5.7 (H) 06/19/2019   HGBA1C 5.5 11/21/2017   HGBA1C 5.8 (H) 06/18/2017   Lab Results  Component Value Date   INSULIN 8.5 06/19/2019   INSULIN 8.6 11/21/2017   INSULIN 16.5 06/18/2017   Lab Results  Component Value Date   TSH 2.150 06/19/2019   Lab Results  Component Value  Date   CHOL 197 06/19/2019   HDL 71 06/19/2019   LDLCALC 112 (H) 06/19/2019   LDLDIRECT 135.0 07/16/2013   TRIG 77 06/19/2019   CHOLHDL 4 07/31/2014   Lab Results  Component Value Date   WBC 5.5 06/18/2017   HGB 15.7 06/18/2017   HCT 45.0 06/18/2017   MCV 98 (H) 06/18/2017   PLT 313.0 07/31/2014   No results found for: IRON, TIBC, FERRITIN  Attestation Statements:   Reviewed by  clinician on day of visit: allergies, medications, problem list, medical history, surgical history, family history, social history, and previous encounter notes.  Time spent on visit including pre-visit chart review and post-visit charting and care was 30 minutes.    I, Trixie Dredge, am acting as transcriptionist for Dennard Nip, MD.  I have reviewed the above documentation for accuracy and completeness, and I agree with the above. - Dennard Nip, MD

## 2019-12-18 ENCOUNTER — Encounter (INDEPENDENT_AMBULATORY_CARE_PROVIDER_SITE_OTHER): Payer: Self-pay

## 2019-12-18 NOTE — Telephone Encounter (Signed)
Please send him the low carb plan

## 2019-12-18 NOTE — Telephone Encounter (Signed)
Please advise 

## 2019-12-18 NOTE — Telephone Encounter (Signed)
Will do!

## 2019-12-29 ENCOUNTER — Encounter (INDEPENDENT_AMBULATORY_CARE_PROVIDER_SITE_OTHER): Payer: Self-pay | Admitting: Family Medicine

## 2019-12-29 NOTE — Progress Notes (Signed)
[facility]    Provider:  Dr Jaynee Eagles Referring Provider: [name], MD Primary Care Physician:  [name], MD  CC:  Chronic Migraines  Virtual Visit via Video Note  I connected with [name] [name] on 12/30/19 at  8:30 AM EDT by a video enabled telemedicine application and verified that I am speaking with the correct person using two identifiers.  Location: Patient: Home Provider: office   I discussed the limitations of evaluation and management by telemedicine and the availability of in person appointments. The patient expressed understanding and agreed to proceed.   I discussed the assessment and treatment plan with the patient. The patient was provided an opportunity to ask questions and all were answered. The patient agreed with the plan and demonstrated an understanding of the instructions.   The patient was advised to call back or seek an in-person evaluation if the symptoms worsen or if the condition fails to improve as anticipated.  I provided 25 minutes of non-face-to-face time during this encounter.  I spent 25 minutes of face-to-face and non-face-to-face time with patient on the  1. Intractable chronic migraine without aura and with status migrainosus   2. Migraine with aura and without status migrainosus, not intractable    diagnosis.  This included previsit chart review, lab review, study review, order entry, electronic health record documentation, patient education on the different diagnostic and therapeutic options, counseling and coordination of care, risks and benefits of management, compliance, or risk factor reduction   Melvenia Beam, MD    12/30/2019: The last week in January to first week in march he had 21 migraines and all severe. He was taking Aimovig, last migraine march 24th,  He started the Ajovy middle of February 18th after he had 7 migraines that month. He had4 more migraines at the end of February. The next Ajovy  was March 8 and just had ajovy shot the first of April. He is optimistic they are working because in the last 12 days he has not had migraines. He has lost some weight. He started an Adkins-type diet with [name]. He needs another back operation (last 2016) and he has radicular pain and this is impacting his exercise as well. He ran out of amitriptyline he had 7 horrible days waiting to get the refill, twice he has missed this medication and he had migraines. He has 3 ajovy shots. Will schedule f/u mid July. We talked about nurte and ubrelvy, lasmiditan, improved with the Ajovy. He loves his cpap, he has a appoitment with Dr. Rexene Alberts which is one year already and he feels much better.   Interval history 08/13/2019: patient is having worsening migraines. Possibly the AImovig is wearing off, we discussed changing to Ajovy, he is very stressed due to covid and the election which is also exacerbating his migraines. He has tried not to watch TV. We tried in the past to change his medications and it did not work, he tried stopping the amitriptyline but his headaches worsened, he woul dlove to stop bc it is difficult to lose weight on it. He has lost 20 pounds and maintained, difficult to lose more. We decided to change to Ajovy, we can provide samples, he will come to the office to get them.    Interval history 12/02/2018: He is now using the cpap. He was snoring and very tired. He is still on Tizanidine, cymbalta, gabapentin and amitriptyline. He is improved on cpap, sleeping better and less migraines.  He is still on the [place]. He is doing better. Been using the cpap for 2 weeks. He is still having back pain which can cause the migraine sin the afternoon He went to the [facility], they didn't know why he has ringing in the ears. He stopped Naproxyn to see if maybe that was causing his symptoms. Gained 30 pounds - everything he lost he gained back. He went to Dr Leafy Ro and is trying to lose weight    Interval history 06/24/2018: he tried Topiramate. He had a terrible July and associated with his back pain. He had a great August because his back felt better. Only 2 minor migraines in August. We tried Topiramate and could not tolerate, tried coming off Amitriptyline and had worse headaches. He takes capsules Gabapentin generic twice daily.  He sees no difference between aimovig 140mg  qmonthly vs 70mg .   Interval history 03/20/2018: Here for follow up of migraines on Aimovig, Gralise, elavil, cymbalta, triptans, tizanidine. He was doing exceptionally well but he had a colonoscopy that worsened his back pain  And also worsened his migraines. He declines botox for migraine, have discussed with him a few times. He does not want botox. He is having difficulty with weight loss despite physical activity and a good diet. Advised TCAs can cause this as can gabapentin. Will try to decrease his amitriptyline. Then start Topiramate ER and decrease neurontin.  Reviewed headache diary: Total headache days a month:  January 2 February 16 March 17 Apil 5 May 5 June 9  Tried: Amitriptyline, Topamax, Cymbalta, Rizatriptan, Aimovig, Melatonin, Coffe before bed (possibly hypnic headaches), Depakote (swelling allergy), Gralise, Tizanidine, Naproxen, flexeril, methocarbamol,   Interval history 11/20/2017: Since starting Gralise and aimovig he is doing extremely well. In January had 2 migraines, in February had 4 days of migraines but otherwise none. The migraines are not as long and not as intense. Reduced intensity. We were having a lot of difficulty getting Aimovig approved, he spent hours on the phone. Gave him samples today. He is doing spectacularly on Aimovig. He takes Tizanidine and Amitriptyline at bedtime. He takes Gralise with food. He continues to [facility] the [facility], he lost 20 pounds. He has been walking, walking everyday. Continue one injection.   Interval history 07/23/2017:   [name] [name] is a 69 y.o. male here as a referral from Dr. Volanda [name] for Migraines. They are improved on Amitriptyline, Gralise and Aimovig. The intensity and duration are improved as well as the frequency. Also helping him sleep. However making him more tired during the early part of the day. He has a "lot of pain" in other places around his body, pain in his right thumb and index finger, he has had multiple orthopaedic operations. They moved into a new house. He has back pain, knee pain and thumb pain. He carried a lot of boxes during the move which exacerbated his symptoms. He feels like his weight is not improving, discussed these medications have weight gain.  He saw [name] a month ago, Reviewed his migraine diary in detail, talked in length about his migraines which have decreased in frequency by 48% and 36.7% in frequency however his migraine severity has reduced by 50% as did it.   Interval history 04/26/2017: patient is following for nocturnal and morning headaches. Has been diagnosed with sleep apnea but refuses CPAP machine and despite discussions that this could be the cause of his headaches. These tried multiple medications as below, we've tried  treatments for migraines as well as possibly hypnic headaches. We've tried the new CGRP monoclonal antibody Aimovig. He has had a migraine every day for the last 2 weeks. Happening at night about 1/2 the time and now more during the day associated with back pain. He is taking melatonin, amitriptyline, cymbalta and flexeril at night. Recommend seeing Dr. Rexene Alberts to discuss sleep study in detail which showed sleep apnea.   Interval history 01/04/2017:Patient following up for morning headaches. MRI brain was normal. Patient has OSA diagnosed here at [facility] but he declined to be treated despite headaches waking him up at night. I have highly encouraged him to use a cpap as this may resolve his nocturnal and morning headaches and also warned of the sequelae of  untreated sleep apnea such as increased risk of stroke. He takes tylenol PM every night, cymbalta, He is on Amitriptyline 75mg  at night. He has been to PT for his musculoskeletal neck pain.   Tried: Amitriptyline, Topamax, Cymbalta, Rizatriptan, Aimovig, Melatonin, Coffe before bed (possibly hypnic headaches), Depakote (swelling allergy), Gralise, Tizanidine  Has not tried: Depakote, propranolol, Indomethacin,   [name] [name] a 69 y.o.malehere as a referral from Dr. Raynelle Chary migraines. PMHx migraines. He has had migraines/headachesfor 10 years. He travels a lot and has significant osteoarthritis. He has had shoulder replacements and back fusion of l4-l5. His skeletal issues are a migraine trigger. All his migraines are in the middle of the night, he wakes up with headaches 95% of the time. He has seen a lot of doctors. Sumatriptan helps with the migraines. He has been to many doctors. He saw Dr. Melton Alar for headaches. He has daily headaches. He was taking Sumatriptan daily but now only 9 times a month and no medication overuse. Headaches start in the neck, feels like he is going to explode, all over the head, so severe he can't even look at things, painful pressure all over the head, in his eyes behind his eyes, stiffness and tense, his head is pounding, nausea, extreme light and sound sensitivity. He snores, he doesn't wake up from snoring. No aura. He will also get migraines during the day if he is not conscious of his posture. Migraines last 8 hours. He is terrified to go to bed at night because he wakes up with headaches. No vision changes, speech changes, new weakness or sensory changes. No other focal neurologic deficits, associated symptoms, inciting events or modifiable factors.  Tried: Topiramate, Elavil, acetaminophen, imitrex   Reviewed notes, labs and imaging from outside physicians, which showed:  Reviewed primary care notes and notes from [facility] spine  center. Patient has a history of migraines. Migraines have been refractory. These responded nicely to sumatriptan but his insurance company has switched to an alternate triptan and he is quite limited in his monthly supply. He is under considerable stress recently. He tried Topamax which she did not tolerate well and recently discontinued. He is previous s/p posterior lumbar interbody fusion at L4-L5 performed on 10-07-15 with noted degenerative changes at L3-L4 with collapse to the right and noted degenerative scoliosis. He has right low back pain and right hip pain that is constant and it hurts in any position. He denies any radicular pain or numbness or weakness. He has a progressive collapse of the disc space at L3-4 that is eccentric and now accentuating the lateral listhesis at L2-3. The MR shows obvious endplate inflammation as well so they will start with ESI efforts and see if this helps.If not, he  will need an extension of his fusion with correciton of the deformity up to L2.Marland Kitchen  MRI LUMBAR SPINE WITHOUT CONTRAST, 08/16/2016 10:51 AM   INDICATION: LOW BACK PAIN, >6 WKS / RED FLAG(S) / RADICULOPATHY \ \ Z98.1 S/P lumbar fusion \ M54.5 Spine pain, lumbar   COMPARISON: Lumbar spine radiograph 07/18/2016 and CT myelogram 07/12/2015   TECHNIQUE: Multiplanar, multi-sequence surface-coil MR imaging of the lumbar spine was performed without contrast.   LEVELS IMAGED: Lower thoracic to upper sacral region.   FINDINGS:  Alignment: Similar grade 1 anterolisthesis of L4 and L5. Trace retrolisthesis of L2 on L3, similar to prior.Similar levoscoliotic curvature of the spine. Vertebrae: There are fatty endplate degenerative changes at L4-L5. Increased T2/STIR signal within the endplates at [id number] and the inferior endplate of L2, compatible with edema/inflammation, likely degenerative in etiology. Redemonstrated postsurgical changes of posterior lumbar interbody fusion at L4-L5 with pedicle screw and rod  internal fixation. Paraspinal soft tissues: Increased T2/STIR signal within the paraspinal musculature adjacent to the operative site, which is favored to reflect postsurgical changes. Conus: Normal in signal and position, terminating at the superior aspect of L2  T12-L1: No significant focal abnormality. L1-L2: No significant focal abnormality. L2-L3: Similar trace retrolisthesis of L2 on L3 with slight uncovering of the disc. There is a left eccentric disc bulge with disc desiccation and height loss. Facet hypertrophy with ligamentum flavum thickening and facet effusions bilaterally. There is mild bilateral foraminal narrowing L3-L4: There is marked narrowing of the right aspect of the disc space at L3-4 with surrounding high STIR, low T1 signal compatible with edema, likely related to progressive degenerative changes at this level. Broad-based disc bulge with facet hypertrophy and ligamentum flavum thickening. There is moderate right lateral recess narrowing. There is moderate right foraminal narrowing and mild left foraminal narrowing. L4-L5: Grade one anterolisthesis of L4 and L5, with slight uncovering of the disc. There is facet hypertrophy and ligamentum flavum thickening without canal narrowing. There is soft tissue extending into the foramen on the right at L4-L5.  L5-S1: Left eccentric disc bulge. There is moderate left foraminal narrowing. No significant canal narrowing.    Review of Systems: Patient complains of symptoms per HPI as well as the following symptoms:weight gain, migraines, back pain. Pertinent negatives per HPI. All others negative    Social History   Socioeconomic History  . Marital status: Married    Spouse name: Not on file  . Number of children: 1  . Years of education: 16+  . Highest education level: Bachelor's degree (e.g., BA, AB, BS)  Occupational History  . Occupation: Retired  Tobacco Use  . Smoking status: Never Smoker  . Smokeless tobacco:  Never Used  Substance and Sexual Activity  . Alcohol use: No    Alcohol/week: 0.0 standard drinks  . Drug use: No  . Sexual activity: Yes    Partners: Female    Birth control/protection: None  Other Topics Concern  . Not on file  Social History Narrative   Lives at home w/ his wife   Right-handed   Caffeine: 2 cups of coffee each morning   Social Determinants of Health   Financial Resource Strain:   . Difficulty of Paying Living Expenses:   Food Insecurity:   . Worried About Charity fundraiser in the Last Year:   . Arboriculturist in the Last Year:   Transportation Needs:   . Film/video editor (Medical):   Marland Kitchen Lack of Transportation (Non-Medical):  Physical Activity:   . Days of Exercise per Week:   . Minutes of Exercise per Session:   Stress:   . Feeling of Stress :   Social Connections:   . Frequency of Communication with Friends and Family:   . Frequency of Social Gatherings with Friends and Family:   . Attends Religious Services:   . Active Member of Clubs or Organizations:   . Attends Archivist Meetings:   Marland Kitchen Marital Status:   Intimate Partner Violence:   . Fear of Current or Ex-Partner:   . Emotionally Abused:   Marland Kitchen Physically Abused:   . Sexually Abused:     Family History  Problem Relation Age of Onset  . COPD Father        family hx  . Emphysema Father   . Aneurysm Mother   . Stroke Mother   . Sudden death Mother   . Cancer Mother   . Obesity Mother   . Breast cancer Mother   . Cancer Maternal Grandfather        lung  . Colon cancer Neg Hx   . Colon polyps Neg Hx     Past Medical History:  Diagnosis Date  . ALLERGIC RHINITIS 04/22/2007  . Allergy   . Cancer (HCC)    HX SKIN CANCER  . Complication of anesthesia    more concerned about positioning of head & neck because of TMJ  . DDD (degenerative disc disease), cervical   . GERD (gastroesophageal reflux disease)    infrequently  . Headache(784.0)    migraines  . History of  kidney stones   . History of skin cancer   . History of TMJ disorder   . HYPERCHOLESTEROLEMIA 04/22/2007  . HYPERLIPIDEMIA 07/29/2007  . Hyperplastic colon polyp   . NEPHROLITHIASIS, HX OF 07/29/2007  . Osteoarth NOS-Unspec 04/22/2007  . Shortness of breath   . Shoulder pain, acute    bilateral    Past Surgical History:  Procedure Laterality Date  . APPENDECTOMY    . BACK SURGERY  10/07/2015  . COLONOSCOPY     10 yrs ago- 2 HPP   . COLONOSCOPY  2019  . JOINT REPLACEMENT    . KNEE ARTHROSCOPY     right x2  . SHOULDER SURGERY     right  . skin cancer biopsy     L forehead  . TOTAL KNEE ARTHROPLASTY Right 08/25/2013   Procedure: RIGHT TOTAL KNEE ARTHROPLASTY;  Surgeon: Gearlean Alf, MD;  Location: [facility];  Service: Orthopedics;  Laterality: Right;  . TOTAL SHOULDER ARTHROPLASTY Left 12/25/2013   DR SUPPLE   . TOTAL SHOULDER ARTHROPLASTY Left 12/25/2013   Procedure: LEFT TOTAL SHOULDER ARTHROPLASTY;  Surgeon: Marin Shutter, MD;  Location: [place];  Service: Orthopedics;  Laterality: Left;  . TOTAL SHOULDER ARTHROPLASTY Right 06/18/2014   DR SUPPLE  . TOTAL SHOULDER ARTHROPLASTY Right 06/18/2014   Procedure: RIGHT TOTAL SHOULDER ARTHROPLASTY;  Surgeon: Marin Shutter, MD;  Location: [place];  Service: Orthopedics;  Laterality: Right;  . WISDOM TOOTH EXTRACTION      Current Outpatient Medications  Medication Sig Dispense Refill  . allopurinol (ZYLOPRIM) 100 MG tablet Take 1 tablet (100 mg total) by mouth daily. 90 tablet 3  . amitriptyline (ELAVIL) 100 MG tablet Take 1 tablet (100 mg total) by mouth at bedtime. 90 tablet 3  . aspirin 81 MG tablet Take 81 mg by mouth daily.    . Cholecalciferol (VITAMIN D PO) Take 1 tablet  by mouth daily.    . Coenzyme Q10 (CO Q 10 PO) Take by mouth daily.    . Cyanocobalamin (VITAMIN B 12 PO) Take by mouth.    . divalproex (DEPAKOTE) 250 MG DR tablet Take 1 tablet (250 mg total) by mouth 2 (two) times daily. 180 tablet 6  . DULoxetine (CYMBALTA) 60  MG capsule Take 1 capsule (60 mg total) by mouth daily. 90 capsule 3  . EPINEPHrine 0.3 mg/0.3 mL IJ SOAJ injection INJECT 0.3 MLS INTO THE MUSCLE ONCE 2 mL 2  . Fremanezumab-vfrm (AJOVY) 225 MG/1.5ML SOAJ Inject 225 mg into the skin every 30 (thirty) days. 1 pen 11  . gabapentin (NEURONTIN) 300 MG capsule Take 1 capsule (300 mg total) by mouth 2 (two) times daily. 180 capsule 4  . Multiple Vitamin (MULTIVITAMIN) tablet Take 1 tablet by mouth daily.    . naproxen (NAPROSYN) 500 MG tablet Take 500 mg by mouth 2 (two) times daily with a meal.    . Omega-3 Fatty Acids (FISH OIL) 1000 MG CAPS Take 1 capsule by mouth daily.    . pravastatin (PRAVACHOL) 40 MG tablet Take 1 tablet (40 mg total) by mouth daily. 90 tablet 3  . Psyllium (METAMUCIL PO) Take 2 tablets by mouth 2 (two) times daily.    . Red Yeast Rice Extract (RED YEAST RICE PO) Take 1 tablet by mouth daily.    . SUMAtriptan (IMITREX) 100 MG tablet Take 1 tablet (100 mg total) by mouth as needed for migraine. May repeat in 2 hours if headache persists or recurs. Do not exceed 200 mg in 24 hours. 27 tablet 0  . tiZANidine (ZANAFLEX) 4 MG tablet Take 1 tablet (4 mg total) by mouth 3 (three) times daily as needed for muscle spasms. 270 tablet 4  . TURMERIC CURCUMIN PO Take 1,050 mg by mouth daily.    . valACYclovir (VALTREX) 500 MG tablet Take 500 mg by mouth daily.    . vitamin C (ASCORBIC ACID) 500 MG tablet Take 500 mg by mouth daily.    . vitamin E 1000 UNIT capsule Take 1,000 Units by mouth daily.     Current Facility-Administered Medications  Medication Dose Route Frequency Provider Last Rate Last Admin  . 0.9 %  sodium chloride infusion  500 mL Intravenous Once Ladene Artist, MD        Allergies as of 12/30/2019 - Review Complete 12/17/2019  Allergen Reaction Noted  . Shrimp [shellfish allergy] Anaphylaxis and Rash 05/22/2012  . Codeine Rash and Other (See Comments)   . Contrast media [iodinated diagnostic agents] Rash 11/11/2015   . Diazepam Rash 06/26/2014  . Amoxicillin Other (See Comments)   . Benadryl [diphenhydramine]  10/23/2017  . Depakote [divalproex sodium] Swelling 07/30/2017  . Erythromycin Other (See Comments)   . Flexeril [cyclobenzaprine]  10/23/2017  . Allegra [fexofenadine] Rash 01/16/2019  . Betadine [povidone iodine] Rash 05/22/2012  . Celebrex [celecoxib] Rash 05/22/2012  . Meperidine and related Rash 05/22/2012  . Methocarbamol Rash 05/22/2012  . Neosporin [neomycin-bacitracin zn-polymyx] Rash 10/23/2017  . Oxycodone Rash 10/14/2015  . Penicillins Rash   . Plavix [clopidogrel] Rash 12/11/2013  . Rivaroxaban Rash 10/20/2013  . Vioxx [rofecoxib] Rash 05/22/2012    Vitals: There were no vitals taken for this visit. Last Weight:  Wt Readings from Last 1 Encounters:  07/10/19 225 lb (102.1 kg)   Last Height:   Ht Readings from Last 1 Encounters:  07/10/19 5\' 9"  (1.753 m)    Physical exam:  Exam: Gen: NAD, conversant  Neuro: Detailed Neurologic Exam  Speech:    Speech is normal; fluent and spontaneous with normal comprehension.  Cognition:    The patient is oriented to person, place, and time;     recent and remote memory intact;     language fluent;     normal attention, concentration,     fund of knowledge Cranial Nerves:    The pupils are equal, round, and reactive to light. Extraocular movements are intact. The face is symmetric. The palate elevates in the midline. Hearing intact. Voice is normal.    Coordination:    No dysmetria  Motor Observation:    no involuntary movements noted.  Posture:    Posture is normal sitting in chair   Assessment/Plan:69 year old with chronic migraines. Significantly improved on Aimovig but worsened the last few months in the setting of back pain and stress and weight gain. Changed him to Ajovy and seems to be working.   - migraines are improving with the cpap, - continue Aovy  - [facility] He is  struggling. His TCA and gabapentin may have something to do with his struggles, we tried to transition to Trokendi but did not tolerate continue capsules gabapentin IR, tried to come off of Amitriptyline but oculd not so continue  Amitriptyline 100, Duloxetine 60. He is following with with [name] and is trying again.  -refill meds  - Tried Depakote and multiple other medications for migraine prevention, did not tolerate (swelling)  - Continue Cpap, feels better, follow up with Dr. Rexene Alberts  Tinnitus: [facility] to [facility], unclear reason why he has it and continues without improvement  - also discussed botox for migraines again, he will consider  Previous A/P:  Currently on 4 migraine preventatives: Cymbalta, Amitriptyline, Gralise and Aimovig. Has failed Topiramate (Tried: Amitriptyline, Topamax, Cymbalta, Rizatriptan, Aimovig, Melatonin, Coffe before bed (possibly hypnic headaches), : Tried: Amitriptyline, Topamax, Cymbalta, Rizatriptan, Aimovig, Melatonin, Coffe before bed (possibly hypnic headaches), Depakote (swelling, did not tolerate), Gralise, Tizanidine  Declines botox for migraines  Acute medication for migraines: Continue Sumatriptan/rizatriptan. Discussed medication overuse and not to take acute medications more than 10 days a month for migraines.   MRI brain wo contrast: normal  He has been to [facility] and did not have a good experience, declines further PT  Tinnitus. May have coincided with taking Naproxen, asked him to stop Naproxen. [facility] to [facility] for Tinnitus and they could not find anything   Sarina Ill, MD  [facility] [address] [place], [place] [address]  Phone [phone] Fax [phone]

## 2019-12-29 NOTE — Telephone Encounter (Signed)
Please advise FYI  

## 2019-12-30 ENCOUNTER — Encounter: Payer: Self-pay | Admitting: *Deleted

## 2019-12-30 ENCOUNTER — Telehealth (INDEPENDENT_AMBULATORY_CARE_PROVIDER_SITE_OTHER): Payer: Medicare HMO | Admitting: Neurology

## 2019-12-30 ENCOUNTER — Encounter: Payer: Self-pay | Admitting: Neurology

## 2019-12-30 DIAGNOSIS — G43711 Chronic migraine without aura, intractable, with status migrainosus: Secondary | ICD-10-CM

## 2019-12-30 DIAGNOSIS — G43109 Migraine with aura, not intractable, without status migrainosus: Secondary | ICD-10-CM

## 2019-12-30 MED ORDER — SUMATRIPTAN SUCCINATE 100 MG PO TABS: 100.0000 mg | ORAL_TABLET | ORAL | 0 refills | Status: DC | PRN

## 2019-12-30 MED ORDER — GABAPENTIN 300 MG PO CAPS: 300.0000 mg | ORAL_CAPSULE | Freq: Two times a day (BID) | ORAL | 4 refills | Status: DC

## 2019-12-30 MED ORDER — DULOXETINE HCL 60 MG PO CPEP: 60.0000 mg | ORAL_CAPSULE | Freq: Every day | ORAL | 3 refills | Status: DC

## 2019-12-30 MED ORDER — TIZANIDINE HCL 4 MG PO TABS: 4.0000 mg | ORAL_TABLET | Freq: Three times a day (TID) | ORAL | 4 refills | Status: DC | PRN

## 2019-12-30 MED ORDER — AJOVY 225 MG/1.5ML ~~LOC~~ SOAJ: 225.0000 mg | SUBCUTANEOUS | 11 refills | Status: DC

## 2019-12-30 MED ORDER — AMITRIPTYLINE HCL 100 MG PO TABS: 100.0000 mg | ORAL_TABLET | Freq: Every day | ORAL | 3 refills | Status: DC

## 2019-12-31 ENCOUNTER — Telehealth: Payer: Self-pay | Admitting: *Deleted

## 2019-12-31 NOTE — Telephone Encounter (Signed)
Completed Ajovy PA on Cover My Meds. Key: [id number]. Awaiting [employer] determination.

## 2020-01-01 ENCOUNTER — Encounter: Payer: Self-pay | Admitting: Adult Health

## 2020-01-01 ENCOUNTER — Ambulatory Visit (INDEPENDENT_AMBULATORY_CARE_PROVIDER_SITE_OTHER): Payer: Self-pay | Admitting: *Deleted

## 2020-01-01 ENCOUNTER — Other Ambulatory Visit: Payer: Self-pay

## 2020-01-01 ENCOUNTER — Telehealth (INDEPENDENT_AMBULATORY_CARE_PROVIDER_SITE_OTHER): Payer: Medicare HMO | Admitting: Adult Health

## 2020-01-01 DIAGNOSIS — Z9989 Dependence on other enabling machines and devices: Secondary | ICD-10-CM

## 2020-01-01 DIAGNOSIS — G4733 Obstructive sleep apnea (adult) (pediatric): Secondary | ICD-10-CM | POA: Diagnosis not present

## 2020-01-01 DIAGNOSIS — Z0289 Encounter for other administrative examinations: Secondary | ICD-10-CM

## 2020-01-01 DIAGNOSIS — G43711 Chronic migraine without aura, intractable, with status migrainosus: Secondary | ICD-10-CM

## 2020-01-01 MED ORDER — AJOVY 225 MG/1.5ML ~~LOC~~ SOAJ: 225.0000 mg | SUBCUTANEOUS | 0 refills | Status: DC

## 2020-01-01 MED ORDER — AJOVY 225 MG/1.5ML ~~LOC~~ SOAJ: 225.0000 mg | SUBCUTANEOUS | 11 refills | Status: DC

## 2020-01-01 NOTE — Telephone Encounter (Signed)
Order placed for the 3 sample Ajovy 225 mg pens that Dr. Jaynee Eagles is providing the patient. Pt will come by today at 3:15 pm to pick-up.

## 2020-01-01 NOTE — Addendum Note (Signed)
Addended by: Gildardo Griffes on: 01/01/2020 02:01 PM   Modules accepted: Orders

## 2020-01-01 NOTE — Progress Notes (Signed)
Pt arrived for Ajovy 225 mg samples per v.o. Dr. Jaynee Eagles. Pt was given 3 pens along with copy of his medication list. I also provided pt information for the Ajovy support through shared solutions for pt to look into. If he should choose to apply he will need to fill out the patient info and sign the release. He can either drop off at the office or send a clear picture of the form through mychart for [name] to print out. Pt verbalized appreciation.

## 2020-01-01 NOTE — Progress Notes (Addendum)
PATIENT: [name] [name] DOB: 06-21-1951  REASON FOR VISIT: follow up HISTORY FROM: patient  Virtual Visit via Video Note  I connected with [name] [name] on 01/01/20 at  2:00 PM EDT by a video enabled telemedicine application located remotely at [facility] and verified that I am speaking with the correct person using two identifiers who was located at their own home.   I discussed the limitations of evaluation and management by telemedicine and the availability of in person appointments. The patient expressed understanding and agreed to proceed.   PATIENT: [name] [name] DOB: [birth year]  REASON FOR VISIT: follow up HISTORY FROM: patient  HISTORY OF PRESENT ILLNESS: Today 01/01/20 :  [name] [name] is a 69 year old male with a history of obstructive sleep apnea on CPAP.  He returns today for follow-up.  His download indicates that he uses his machine nightly for compliance of 100%.  He uses machine greater than 4 hours 25 days for compliance of 83%.  On average he uses his machine 5 hours and 54 minutes.  His residual AHI is 3.7 on 7 to 14 cm of water with EPR of 3.  Leak in the 95th percentile is 17.4 L/min.  He reports that the CPAP is working well for him.  He denies any new issues.  HISTORY 12/26/2018: Please also see below for documentation of the virtual visit.  I reviewed his AutoPap compliance data from 11/24/2018 through 12/23/2018 which is a total of 30 days, during which time he used his machine every night with percent used days greater than 4 hours at 90%, indicating excellent compliance with an average usage of 6 hours and 4 minutes, residual AHI at goal at 4.2 per hour, 95th percentile of pressure at 12.6 cm, leak on the higher end with the 95th percentile at 18.9 L/m on a pressure range of 7 cm to 14 cm with EPR.  The patient's allergies, current medications, family history, past medical history, past social history, past surgical history and  problem list were reviewed and updated as appropriate.   REVIEW OF SYSTEMS: Out of a complete 14 system review of symptoms, the patient complains only of the following symptoms, and all other reviewed systems are negative.  See HPI  ALLERGIES: Allergies  Allergen Reactions  . Shrimp [Shellfish Allergy] Anaphylaxis and Rash  . Codeine Rash and Other (See Comments)    Blacked out  . Contrast Media [Iodinated Diagnostic Agents] Rash  . Diazepam Rash    Rash over whole body from neck down, and he lost his voice (airway compromise?)  . Amoxicillin Other (See Comments)    Unknown   . Benadryl [Diphenhydramine]     With dyes   . Depakote [Divalproex Sodium] Swelling    Per pts report (given by Dr. Inda Merlin).  Has throat swelling and rash.  . Erythromycin Other (See Comments)    unknown  . Flexeril [Cyclobenzaprine]   . Allegra [Fexofenadine] Rash       . Betadine [Povidone Iodine] Rash  . Celebrex [Celecoxib] Rash  . Meperidine And Related Rash  . Methocarbamol Rash  . Neosporin [Neomycin-Bacitracin Zn-Polymyx] Rash  . Oxycodone Rash  . Penicillins Rash  . Plavix [Clopidogrel] Rash  . Rivaroxaban Rash  . Vioxx [Rofecoxib] Rash    HOME MEDICATIONS: Outpatient Medications Prior to Visit  Medication Sig Dispense Refill  . allopurinol (ZYLOPRIM) 100 MG tablet Take 1 tablet (100 mg total) by mouth daily. 90 tablet 3  .  amitriptyline (ELAVIL) 100 MG tablet Take 1 tablet (100 mg total) by mouth at bedtime. 90 tablet 3  . aspirin 81 MG tablet Take 81 mg by mouth daily.    . Cholecalciferol (VITAMIN D PO) Take 1 tablet by mouth daily.    . Coenzyme Q10 (CO Q 10 PO) Take by mouth daily.    . Cyanocobalamin (VITAMIN B 12 PO) Take by mouth.    . DULoxetine (CYMBALTA) 60 MG capsule Take 1 capsule (60 mg total) by mouth daily. 90 capsule 3  . EPINEPHrine 0.3 mg/0.3 mL IJ SOAJ injection INJECT 0.3 MLS INTO THE MUSCLE ONCE 2 mL 2  . Fremanezumab-vfrm (AJOVY) 225 MG/1.5ML SOAJ Inject  225 mg into the skin every 30 (thirty) days. 1 pen 11  . gabapentin (NEURONTIN) 300 MG capsule Take 1 capsule (300 mg total) by mouth 2 (two) times daily. 180 capsule 4  . Multiple Vitamin (MULTIVITAMIN) tablet Take 1 tablet by mouth daily.    . naproxen (NAPROSYN) 500 MG tablet Take 500 mg by mouth 2 (two) times daily with a meal.    . Omega-3 Fatty Acids (FISH OIL) 1000 MG CAPS Take 1 capsule by mouth daily.    . pravastatin (PRAVACHOL) 40 MG tablet Take 1 tablet (40 mg total) by mouth daily. 90 tablet 3  . Psyllium (METAMUCIL PO) Take 2 tablets by mouth 2 (two) times daily.    . Red Yeast Rice Extract (RED YEAST RICE PO) Take 1 tablet by mouth daily.    . SUMAtriptan (IMITREX) 100 MG tablet Take 1 tablet (100 mg total) by mouth as needed for migraine. May repeat in 2 hours if headache persists or recurs. Do not exceed 200 mg in 24 hours. 27 tablet 0  . tiZANidine (ZANAFLEX) 4 MG tablet Take 1 tablet (4 mg total) by mouth 3 (three) times daily as needed for muscle spasms. 270 tablet 4  . TURMERIC CURCUMIN PO Take 1,050 mg by mouth daily.    . valACYclovir (VALTREX) 500 MG tablet Take 500 mg by mouth daily.    . vitamin C (ASCORBIC ACID) 500 MG tablet Take 500 mg by mouth daily.    . vitamin E 1000 UNIT capsule Take 1,000 Units by mouth daily.    . divalproex (DEPAKOTE) 250 MG DR tablet Take 1 tablet (250 mg total) by mouth 2 (two) times daily. 180 tablet 6   Facility-Administered Medications Prior to Visit  Medication Dose Route Frequency Provider Last Rate Last Admin  . 0.9 %  sodium chloride infusion  500 mL Intravenous Once Ladene Artist, MD        PAST MEDICAL HISTORY: Past Medical History:  Diagnosis Date  . ALLERGIC RHINITIS 04/22/2007  . Allergy   . Cancer (HCC)    HX SKIN CANCER  . Complication of anesthesia    more concerned about positioning of head & neck because of TMJ  . DDD (degenerative disc disease), cervical   . GERD (gastroesophageal reflux disease)     infrequently  . Headache(784.0)    migraines  . History of kidney stones   . History of skin cancer   . History of TMJ disorder   . HYPERCHOLESTEROLEMIA 04/22/2007  . HYPERLIPIDEMIA 07/29/2007  . Hyperplastic colon polyp   . NEPHROLITHIASIS, HX OF 07/29/2007  . Osteoarth NOS-Unspec 04/22/2007  . Shortness of breath   . Shoulder pain, acute    bilateral    PAST SURGICAL HISTORY: Past Surgical History:  Procedure Laterality Date  . APPENDECTOMY    .  BACK SURGERY  10/07/2015  . COLONOSCOPY     10 yrs ago- 2 HPP   . COLONOSCOPY  2019  . JOINT REPLACEMENT    . KNEE ARTHROSCOPY     right x2  . SHOULDER SURGERY     right  . skin cancer biopsy     L forehead  . TOTAL KNEE ARTHROPLASTY Right 08/25/2013   Procedure: RIGHT TOTAL KNEE ARTHROPLASTY;  Surgeon: Gearlean Alf, MD;  Location: [facility];  Service: Orthopedics;  Laterality: Right;  . TOTAL SHOULDER ARTHROPLASTY Left 12/25/2013   DR SUPPLE   . TOTAL SHOULDER ARTHROPLASTY Left 12/25/2013   Procedure: LEFT TOTAL SHOULDER ARTHROPLASTY;  Surgeon: Marin Shutter, MD;  Location: [place];  Service: Orthopedics;  Laterality: Left;  . TOTAL SHOULDER ARTHROPLASTY Right 06/18/2014   DR SUPPLE  . TOTAL SHOULDER ARTHROPLASTY Right 06/18/2014   Procedure: RIGHT TOTAL SHOULDER ARTHROPLASTY;  Surgeon: Marin Shutter, MD;  Location: [place];  Service: Orthopedics;  Laterality: Right;  . WISDOM TOOTH EXTRACTION      FAMILY HISTORY: Family History  Problem Relation Age of Onset  . COPD Father        family hx  . Emphysema Father   . Aneurysm Mother   . Stroke Mother   . Sudden death Mother   . Cancer Mother   . Obesity Mother   . Breast cancer Mother   . Cancer Maternal Grandfather        lung  . Colon cancer Neg Hx   . Colon polyps Neg Hx     SOCIAL HISTORY: Social History   Socioeconomic History  . Marital status: Married    Spouse name: Not on file  . Number of children: 1  . Years of education: 16+  . Highest education level:  Bachelor's degree (e.g., BA, AB, BS)  Occupational History  . Occupation: Retired  Tobacco Use  . Smoking status: Never Smoker  . Smokeless tobacco: Never Used  Substance and Sexual Activity  . Alcohol use: No    Alcohol/week: 0.0 standard drinks  . Drug use: No  . Sexual activity: Yes    Partners: Female    Birth control/protection: None  Other Topics Concern  . Not on file  Social History Narrative   Lives at home w/ his wife   Right-handed   Caffeine: 2 cups of coffee each morning   Social Determinants of Health   Financial Resource Strain:   . Difficulty of Paying Living Expenses:   Food Insecurity:   . Worried About Charity fundraiser in the Last Year:   . Arboriculturist in the Last Year:   Transportation Needs:   . Film/video editor (Medical):   Marland Kitchen Lack of Transportation (Non-Medical):   Physical Activity:   . Days of Exercise per Week:   . Minutes of Exercise per Session:   Stress:   . Feeling of Stress :   Social Connections:   . Frequency of Communication with Friends and Family:   . Frequency of Social Gatherings with Friends and Family:   . Attends Religious Services:   . Active Member of Clubs or Organizations:   . Attends Archivist Meetings:   Marland Kitchen Marital Status:   Intimate Partner Violence:   . Fear of Current or Ex-Partner:   . Emotionally Abused:   Marland Kitchen Physically Abused:   . Sexually Abused:       PHYSICAL EXAM Generalized: Well developed, in no acute distress  Neurological examination  Mentation: Alert oriented to time, place, history taking. Follows all commands speech and language fluent Cranial nerve II-XII:Extraocular movements were full. Facial symmetry noted. uvula tongue midline. Head turning and shoulder shrug  were normal and symmetric. Motor: Good strength throughout subjectively per patient Sensory: Sensory testing is intact to soft touch on all 4 extremities subjectively per patient Coordination: Cerebellar testing  reveals good finger-nose-finger  Gait and station: Patient is able to stand from a seated position. gait is normal.  Reflexes: UTA  DIAGNOSTIC DATA (LABS, IMAGING, TESTING) - I reviewed patient records, labs, notes, testing and imaging myself where available.  Lab Results  Component Value Date   WBC 5.5 06/18/2017   HGB 15.7 06/18/2017   HCT 45.0 06/18/2017   MCV 98 (H) 06/18/2017   PLT 313.0 07/31/2014      Component Value Date/Time   NA 138 06/19/2019 1436   K 4.5 06/19/2019 1436   CL 101 06/19/2019 1436   CO2 23 06/19/2019 1436   GLUCOSE 102 (H) 06/19/2019 1436   GLUCOSE 94 07/31/2014 0920   GLUCOSE 94 08/20/2006 0830   BUN 29 (H) 06/19/2019 1436   CREATININE 1.21 06/19/2019 1436   CALCIUM 9.2 06/19/2019 1436   PROT 7.2 06/19/2019 1436   ALBUMIN 4.4 06/19/2019 1436   AST 45 (H) 06/19/2019 1436   ALT 41 06/19/2019 1436   ALKPHOS 55 06/19/2019 1436   BILITOT 0.4 06/19/2019 1436   GFRNONAA 61 06/19/2019 1436   GFRAA 71 06/19/2019 1436   Lab Results  Component Value Date   CHOL 197 06/19/2019   HDL 71 06/19/2019   LDLCALC 112 (H) 06/19/2019   LDLDIRECT 135.0 07/16/2013   TRIG 77 06/19/2019   CHOLHDL 4 07/31/2014   Lab Results  Component Value Date   HGBA1C 5.7 (H) 06/19/2019   No results found for: VITAMINB12 Lab Results  Component Value Date   TSH 2.150 06/19/2019      ASSESSMENT AND PLAN 69 y.o. year old male  has a past medical history of ALLERGIC RHINITIS (04/22/2007), Allergy, Cancer ([place]), Complication of anesthesia, DDD (degenerative disc disease), cervical, GERD (gastroesophageal reflux disease), Headache(784.0), History of kidney stones, History of skin cancer, History of TMJ disorder, HYPERCHOLESTEROLEMIA (04/22/2007), HYPERLIPIDEMIA (07/29/2007), Hyperplastic colon polyp, NEPHROLITHIASIS, HX OF (07/29/2007), Osteoarth NOS-Unspec (04/22/2007), Shortness of breath, and Shoulder pain, acute. here with:  1.  Obstructive sleep apnea on CPAP  -Good  compliance -Good treatment of apnea -Encouraged to use CPAP nightly and greater than 4 hours each night -Follow-up in 1 year or sooner if needed   I spent 20 minutes with the patient. 50% of this time was spent   Ward Givens, MSN, NP-C 01/01/2020, 2:06 PM [facility] [address] [place], [place] [address] [phone]  I reviewed the above note and documentation by the Nurse Practitioner and agree with the history, exam, assessment and plan as outlined above. I was available for consultation. [name], MD, PhD [facility])

## 2020-01-01 NOTE — Addendum Note (Signed)
Addended by: Gildardo Griffes on: 01/01/2020 10:10 AM   Modules accepted: Orders

## 2020-01-01 NOTE — Telephone Encounter (Signed)
Per Cover My Meds, [employer] has approvd the Ajovy through 09/24/20.

## 2020-01-05 NOTE — Telephone Encounter (Addendum)
I spoke with [facility]. I was told that we cannot request a TIER exemption/reduction because the Ajovy was approved a non-formulary exception. I clarified with her that we would not even be able to request this going forward because the Ajovy is not formulary. Pt would have to incur the cost as it is non-formulary. She confirmed the staff member who advised pt we could request TIER reduction was unforunately incorrect.

## 2020-02-04 DIAGNOSIS — B009 Herpesviral infection, unspecified: Secondary | ICD-10-CM | POA: Diagnosis not present

## 2020-02-04 DIAGNOSIS — L820 Inflamed seborrheic keratosis: Secondary | ICD-10-CM | POA: Diagnosis not present

## 2020-02-10 ENCOUNTER — Encounter (INDEPENDENT_AMBULATORY_CARE_PROVIDER_SITE_OTHER): Payer: Self-pay | Admitting: Family Medicine

## 2020-02-11 ENCOUNTER — Encounter (INDEPENDENT_AMBULATORY_CARE_PROVIDER_SITE_OTHER): Payer: Self-pay | Admitting: Family Medicine

## 2020-02-11 ENCOUNTER — Ambulatory Visit (INDEPENDENT_AMBULATORY_CARE_PROVIDER_SITE_OTHER): Payer: Medicare HMO | Admitting: Family Medicine

## 2020-02-11 ENCOUNTER — Other Ambulatory Visit: Payer: Self-pay

## 2020-02-11 VITALS — BP 116/77 | HR 67 | Temp 98.3°F | Ht 69.0 in | Wt 214.0 lb

## 2020-02-11 DIAGNOSIS — E669 Obesity, unspecified: Secondary | ICD-10-CM

## 2020-02-11 DIAGNOSIS — Z6831 Body mass index (BMI) 31.0-31.9, adult: Secondary | ICD-10-CM

## 2020-02-11 DIAGNOSIS — K5900 Constipation, unspecified: Secondary | ICD-10-CM

## 2020-02-11 NOTE — Telephone Encounter (Signed)
FYI

## 2020-02-11 NOTE — Progress Notes (Signed)
Chief Complaint:   OBESITY [name] [name] is here to discuss his progress with his obesity treatment plan along with follow-up of his obesity related diagnoses. [name] is on keeping a food journal and adhering to recommended goals of 2000 calories and 120+ grams of protein daily and states he is following his eating plan approximately 80% of the time. [name] states he is walking for 60-120 minutes 7 times per week.  Today's visit was #: 22 Starting weight: 230 lbs Starting date: 06/18/2017 Today's weight: 214 lbs Today's date: 02/11/2020 Total lbs lost to date: 16 Total lbs lost since last in-office visit: 11  Interim History: [name] has done very well with tracking his calories, micronutrients, as well as exercise and CPAP usages. He note an increase in sugar intake and is correlated with an increase in migraine headaches.  Subjective:   1. Constipation, unspecified constipation type [name] id working on increasing fiber in his diet but he is struggling. He is eating dates for fiber but he notes these are high in sugar as well.  Assessment/Plan:   1. Constipation, unspecified constipation type [name] was informed that a decrease in bowel movement frequency is normal while losing weight, but stools should not be hard or painful. [name] will increase fiber to 25 grams per day, and higher fiber food were discussed with lower sugar content. Orders and follow up as documented in patient record.   2. Class 1 obesity with serious comorbidity and body mass index (BMI) of 31.0 to 31.9 in adult, unspecified obesity type [name] is currently in the action stage of change. As such, his goal is to continue with weight loss efforts. He has agreed to keeping a food journal and adhering to recommended goals of 2000 calories and 120+ grams of protein daily.   We will recheck IC at his next visit.  Exercise goals: As is.  Behavioral modification strategies: decreasing simple carbohydrates and increasing  vegetables.  [name] has agreed to follow-up with our clinic in 4 weeks. He was informed of the importance of frequent follow-up visits to maximize his success with intensive lifestyle modifications for his multiple health conditions.   Objective:   Blood pressure 116/77, pulse 67, temperature 98.3 F (36.8 C), temperature source Oral, height 5\' 9"  (1.753 m), weight 214 lb (97.1 kg), SpO2 96 %. Body mass index is 31.6 kg/m.  General: Cooperative, alert, well developed, in no acute distress. HEENT: Conjunctivae and lids unremarkable. Cardiovascular: Regular rhythm.  Lungs: Normal work of breathing. Neurologic: No focal deficits.   Lab Results  Component Value Date   CREATININE 1.21 06/19/2019   BUN 29 (H) 06/19/2019   NA 138 06/19/2019   K 4.5 06/19/2019   CL 101 06/19/2019   CO2 23 06/19/2019   Lab Results  Component Value Date   ALT 41 06/19/2019   AST 45 (H) 06/19/2019   ALKPHOS 55 06/19/2019   BILITOT 0.4 06/19/2019   Lab Results  Component Value Date   HGBA1C 5.7 (H) 06/19/2019   HGBA1C 5.5 11/21/2017   HGBA1C 5.8 (H) 06/18/2017   Lab Results  Component Value Date   INSULIN 8.5 06/19/2019   INSULIN 8.6 11/21/2017   INSULIN 16.5 06/18/2017   Lab Results  Component Value Date   TSH 2.150 06/19/2019   Lab Results  Component Value Date   CHOL 197 06/19/2019   HDL 71 06/19/2019   LDLCALC 112 (H) 06/19/2019   LDLDIRECT 135.0 07/16/2013   TRIG 77 06/19/2019   CHOLHDL 4  07/31/2014   Lab Results  Component Value Date   WBC 5.5 06/18/2017   HGB 15.7 06/18/2017   HCT 45.0 06/18/2017   MCV 98 (H) 06/18/2017   PLT 313.0 07/31/2014   No results found for: IRON, TIBC, FERRITIN  Attestation Statements:   Reviewed by clinician on day of visit: allergies, medications, problem list, medical history, surgical history, family history, social history, and previous encounter notes.  Time spent on visit including pre-visit chart review and post-visit care and  charting was 30 minutes.    I, Trixie Dredge, am acting as transcriptionist for Dennard Nip, MD.  I have reviewed the above documentation for accuracy and completeness, and I agree with the above. -  Dennard Nip, MD

## 2020-03-04 DIAGNOSIS — G4733 Obstructive sleep apnea (adult) (pediatric): Secondary | ICD-10-CM | POA: Diagnosis not present

## 2020-03-08 ENCOUNTER — Telehealth: Payer: Self-pay | Admitting: Family Medicine

## 2020-03-08 ENCOUNTER — Encounter: Payer: Self-pay | Admitting: Family Medicine

## 2020-03-08 ENCOUNTER — Telehealth (INDEPENDENT_AMBULATORY_CARE_PROVIDER_SITE_OTHER): Payer: Medicare HMO | Admitting: Family Medicine

## 2020-03-08 DIAGNOSIS — J069 Acute upper respiratory infection, unspecified: Secondary | ICD-10-CM

## 2020-03-08 MED ORDER — METHYLPREDNISOLONE 4 MG PO TBPK: ORAL_TABLET | ORAL | 0 refills | Status: DC

## 2020-03-08 NOTE — Progress Notes (Signed)
Subjective:    Patient ID: [name] [name], male    DOB: 1951/03/29, 69 y.o.   MRN: [id number]  HPI Here for 5 days of hoarseness. About 7 days ago he developed a stuffy head, sinus congestion, PND, and a dry cough, and he thinks it was visiting his grandchildren (ages 38 and 33). After a few days most of these symptoms resolved but the hoarseness began. He is drinking fluids and taking Delsym. No fever or body aches, no NVD, no SOB or chest pain.  Virtual Visit via Video Note  I connected with the patient on 03/08/20 at  2:30 PM EDT by a video enabled telemedicine application and verified that I am speaking with the correct person using two identifiers.  Location patient: home Location provider:work or home office Persons participating in the virtual visit: patient, provider  I discussed the limitations of evaluation and management by telemedicine and the availability of in person appointments. The patient expressed understanding and agreed to proceed.   HPI:    ROS: See pertinent positives and negatives per HPI.  Past Medical History:  Diagnosis Date  . ALLERGIC RHINITIS 04/22/2007  . Allergy   . Cancer (HCC)    HX SKIN CANCER  . Complication of anesthesia    more concerned about positioning of head & neck because of TMJ  . DDD (degenerative disc disease), cervical   . GERD (gastroesophageal reflux disease)    infrequently  . Headache(784.0)    migraines  . History of kidney stones   . History of skin cancer   . History of TMJ disorder   . HYPERCHOLESTEROLEMIA 04/22/2007  . HYPERLIPIDEMIA 07/29/2007  . Hyperplastic colon polyp   . NEPHROLITHIASIS, HX OF 07/29/2007  . Osteoarth NOS-Unspec 04/22/2007  . Shortness of breath   . Shoulder pain, acute    bilateral    Past Surgical History:  Procedure Laterality Date  . APPENDECTOMY    . BACK SURGERY  10/07/2015  . COLONOSCOPY     10 yrs ago- 2 HPP   . COLONOSCOPY  2019  . JOINT REPLACEMENT    . KNEE ARTHROSCOPY      right x2  . SHOULDER SURGERY     right  . skin cancer biopsy     L forehead  . TOTAL KNEE ARTHROPLASTY Right 08/25/2013   Procedure: RIGHT TOTAL KNEE ARTHROPLASTY;  Surgeon: Gearlean Alf, MD;  Location: [facility];  Service: Orthopedics;  Laterality: Right;  . TOTAL SHOULDER ARTHROPLASTY Left 12/25/2013   DR SUPPLE   . TOTAL SHOULDER ARTHROPLASTY Left 12/25/2013   Procedure: LEFT TOTAL SHOULDER ARTHROPLASTY;  Surgeon: Marin Shutter, MD;  Location: [place];  Service: Orthopedics;  Laterality: Left;  . TOTAL SHOULDER ARTHROPLASTY Right 06/18/2014   DR SUPPLE  . TOTAL SHOULDER ARTHROPLASTY Right 06/18/2014   Procedure: RIGHT TOTAL SHOULDER ARTHROPLASTY;  Surgeon: Marin Shutter, MD;  Location: [place];  Service: Orthopedics;  Laterality: Right;  . WISDOM TOOTH EXTRACTION      Family History  Problem Relation Age of Onset  . COPD Father        family hx  . Emphysema Father   . Aneurysm Mother   . Stroke Mother   . Sudden death Mother   . Cancer Mother   . Obesity Mother   . Breast cancer Mother   . Cancer Maternal Grandfather        lung  . Colon cancer Neg Hx   . Colon polyps Neg Hx  Current Outpatient Medications:  .  allopurinol (ZYLOPRIM) 100 MG tablet, Take 1 tablet (100 mg total) by mouth daily., Disp: 90 tablet, Rfl: 3 .  amitriptyline (ELAVIL) 100 MG tablet, Take 1 tablet (100 mg total) by mouth at bedtime., Disp: 90 tablet, Rfl: 3 .  aspirin 81 MG tablet, Take 81 mg by mouth daily., Disp: , Rfl:  .  Cholecalciferol (VITAMIN D PO), Take 1 tablet by mouth daily., Disp: , Rfl:  .  Coenzyme Q10 (CO Q 10 PO), Take by mouth daily., Disp: , Rfl:  .  Cyanocobalamin (VITAMIN B 12 PO), Take by mouth., Disp: , Rfl:  .  DULoxetine (CYMBALTA) 60 MG capsule, Take 1 capsule (60 mg total) by mouth daily., Disp: 90 capsule, Rfl: 3 .  EPINEPHrine 0.3 mg/0.3 mL IJ SOAJ injection, INJECT 0.3 MLS INTO THE MUSCLE ONCE, Disp: 2 mL, Rfl: 2 .  Fremanezumab-vfrm (AJOVY) 225 MG/1.5ML SOAJ, Inject  225 mg into the skin every 30 (thirty) days., Disp: 1 pen, Rfl: 11 .  Fremanezumab-vfrm (AJOVY) 225 MG/1.5ML SOAJ, Inject 225 mg into the skin every 30 (thirty) days., Disp: 3 pen, Rfl: 0 .  gabapentin (NEURONTIN) 300 MG capsule, Take 1 capsule (300 mg total) by mouth 2 (two) times daily., Disp: 180 capsule, Rfl: 4 .  methylPREDNISolone (MEDROL DOSEPAK) 4 MG TBPK tablet, As directed, Disp: 21 tablet, Rfl: 0 .  Multiple Vitamin (MULTIVITAMIN) tablet, Take 1 tablet by mouth daily., Disp: , Rfl:  .  naproxen (NAPROSYN) 500 MG tablet, Take 500 mg by mouth 2 (two) times daily with a meal., Disp: , Rfl:  .  Omega-3 Fatty Acids (FISH OIL) 1000 MG CAPS, Take 1 capsule by mouth daily., Disp: , Rfl:  .  pravastatin (PRAVACHOL) 40 MG tablet, Take 1 tablet (40 mg total) by mouth daily., Disp: 90 tablet, Rfl: 3 .  Psyllium (METAMUCIL PO), Take 2 tablets by mouth 2 (two) times daily., Disp: , Rfl:  .  Red Yeast Rice Extract (RED YEAST RICE PO), Take 1 tablet by mouth daily., Disp: , Rfl:  .  SUMAtriptan (IMITREX) 100 MG tablet, Take 1 tablet (100 mg total) by mouth as needed for migraine. May repeat in 2 hours if headache persists or recurs. Do not exceed 200 mg in 24 hours., Disp: 27 tablet, Rfl: 0 .  tiZANidine (ZANAFLEX) 4 MG tablet, Take 1 tablet (4 mg total) by mouth 3 (three) times daily as needed for muscle spasms., Disp: 270 tablet, Rfl: 4 .  TURMERIC CURCUMIN PO, Take 1,050 mg by mouth daily., Disp: , Rfl:  .  valACYclovir (VALTREX) 500 MG tablet, Take 500 mg by mouth daily., Disp: , Rfl:  .  vitamin C (ASCORBIC ACID) 500 MG tablet, Take 500 mg by mouth daily., Disp: , Rfl:  .  vitamin E 1000 UNIT capsule, Take 1,000 Units by mouth daily., Disp: , Rfl:   Current Facility-Administered Medications:  .  0.9 %  sodium chloride infusion, 500 mL, Intravenous, Once, Ladene Artist, MD  EXAM:  VITALS per patient if applicable:  GENERAL: alert, oriented, appears well and in no acute distress  HEENT:  atraumatic, conjunttiva clear, no obvious abnormalities on inspection of external nose and ears  NECK: normal movements of the head and neck  LUNGS: on inspection no signs of respiratory distress, breathing rate appears normal, no obvious gross SOB, gasping or wheezing  CV: no obvious cyanosis  MS: moves all visible extremities without noticeable abnormality  PSYCH/NEURO: pleasant and cooperative, no obvious depression  or anxiety, speech and thought processing grossly intact  ASSESSMENT AND PLAN: He has had a viral URI with some residual laryngitis. Treat with a Medrol dose pack. Recheck prn.  Alysia Penna, MD  Discussed the following assessment and plan:  No diagnosis found.     I discussed the assessment and treatment plan with the patient. The patient was provided an opportunity to ask questions and all were answered. The patient agreed with the plan and demonstrated an understanding of the instructions.   The patient was advised to call back or seek an in-person evaluation if the symptoms worsen or if the condition fails to improve as anticipated.     Review of Systems     Objective:   Physical Exam        Assessment & Plan:

## 2020-03-08 NOTE — Telephone Encounter (Signed)
FYI  Called pt and apologized for the dropped calls during transfer. I also advised to go to local UC or ED. Pt stated that he does not need to go. Pt also stated that he has had a sore throat for 6 days and wanted to come to the office. I advised pt that with that sx he cannot come into the office before I was going to offer a virtual pt became frustrated and upset. Pt stated that he does not have a sore throat anymore but has lost his voice. Pt then stated " you guys are useless have a great day". I disconnected the phone.

## 2020-03-08 NOTE — Telephone Encounter (Signed)
Patients wife called wanting to schedule an in office appointment for the patient with Dr. Volanda [name]. Dr. Volanda [name] does not have any appointments available, I suggested scheduling with another provider this afternoon but she feels like he needs to be seen ASAP because he has sleep apnea and can't use is machine. She stated that he can't speak and it's been 2 days and he has chest congestion. I placed the patients wife on hold and spoke with Tillie Rung that is working with Dr. Volanda [name] and she suggested to send her to our nurse triage. By the time I came back to the phone she had hung up. I called the patients wife back and she said taht she was confused because someone just talked to the patient and scheduled him a virtual visit with Dr. Sarajane Jews and the patient is not wanting virtual so I sent the patients wife to nurse triage to speak with them and see what they suggest for the patient.

## 2020-03-12 ENCOUNTER — Other Ambulatory Visit: Payer: Self-pay | Admitting: Neurology

## 2020-03-12 DIAGNOSIS — R0781 Pleurodynia: Secondary | ICD-10-CM | POA: Diagnosis not present

## 2020-03-14 ENCOUNTER — Encounter (INDEPENDENT_AMBULATORY_CARE_PROVIDER_SITE_OTHER): Payer: Self-pay | Admitting: Family Medicine

## 2020-03-15 ENCOUNTER — Ambulatory Visit (INDEPENDENT_AMBULATORY_CARE_PROVIDER_SITE_OTHER): Payer: Medicare HMO | Admitting: Family Medicine

## 2020-03-15 ENCOUNTER — Other Ambulatory Visit: Payer: Self-pay

## 2020-03-15 ENCOUNTER — Encounter (INDEPENDENT_AMBULATORY_CARE_PROVIDER_SITE_OTHER): Payer: Self-pay | Admitting: Family Medicine

## 2020-03-15 VITALS — BP 134/84 | HR 71 | Temp 98.1°F | Ht 69.0 in | Wt 194.0 lb

## 2020-03-15 DIAGNOSIS — E7849 Other hyperlipidemia: Secondary | ICD-10-CM

## 2020-03-15 DIAGNOSIS — R7303 Prediabetes: Secondary | ICD-10-CM | POA: Diagnosis not present

## 2020-03-15 DIAGNOSIS — E669 Obesity, unspecified: Secondary | ICD-10-CM

## 2020-03-15 DIAGNOSIS — E559 Vitamin D deficiency, unspecified: Secondary | ICD-10-CM | POA: Diagnosis not present

## 2020-03-15 DIAGNOSIS — Z6830 Body mass index (BMI) 30.0-30.9, adult: Secondary | ICD-10-CM | POA: Diagnosis not present

## 2020-03-16 LAB — COMPREHENSIVE METABOLIC PANEL
ALT: 22 IU/L (ref 0–44)
AST: 24 IU/L (ref 0–40)
Albumin/Globulin Ratio: 1.7 (ref 1.2–2.2)
Albumin: 4.4 g/dL (ref 3.8–4.8)
Alkaline Phosphatase: 51 IU/L (ref 48–121)
BUN/Creatinine Ratio: 28 — ABNORMAL HIGH (ref 10–24)
BUN: 36 mg/dL — ABNORMAL HIGH (ref 8–27)
Bilirubin Total: 0.4 mg/dL (ref 0.0–1.2)
CO2: 23 mmol/L (ref 20–29)
Calcium: 9.2 mg/dL (ref 8.6–10.2)
Chloride: 100 mmol/L (ref 96–106)
Creatinine, Ser: 1.28 mg/dL — ABNORMAL HIGH (ref 0.76–1.27)
GFR calc Af Amer: 66 mL/min/{1.73_m2} (ref >59)
GFR calc non Af Amer: 57 mL/min/{1.73_m2} — ABNORMAL LOW (ref >59)
Globulin, Total: 2.6 g/dL (ref 1.5–4.5)
Glucose: 79 mg/dL (ref 65–99)
Potassium: 4.5 mmol/L (ref 3.5–5.2)
Sodium: 141 mmol/L (ref 134–144)
Total Protein: 7 g/dL (ref 6.0–8.5)

## 2020-03-16 LAB — LIPID PANEL
Chol/HDL Ratio: 2.8 ratio (ref 0.0–5.0)
Cholesterol, Total: 233 mg/dL — ABNORMAL HIGH (ref 100–199)
HDL: 84 mg/dL (ref >39)
LDL Chol Calc (NIH): 137 mg/dL — ABNORMAL HIGH (ref 0–99)
Triglycerides: 72 mg/dL (ref 0–149)
VLDL Cholesterol Cal: 12 mg/dL (ref 5–40)

## 2020-03-16 LAB — HEMOGLOBIN A1C
Est. average glucose Bld gHb Est-mCnc: 105 mg/dL
Hgb A1c MFr Bld: 5.3 % (ref 4.8–5.6)

## 2020-03-16 LAB — INSULIN, RANDOM: INSULIN: 5 u[IU]/mL (ref 2.6–24.9)

## 2020-03-16 LAB — VITAMIN D 25 HYDROXY (VIT D DEFICIENCY, FRACTURES): Vit D, 25-Hydroxy: 89.1 ng/mL (ref 30.0–100.0)

## 2020-03-16 NOTE — Progress Notes (Signed)
Chief Complaint:   OBESITY [name] [name] is here to discuss his progress with his obesity treatment plan along with follow-up of his obesity related diagnoses. [name] [name] is on keeping a food journal and adhering to recommended goals of 2000 calories and 120+ grams of protein daily and states he is following his eating plan approximately 90% of the time. [name] [name] states he is doing Paox 7 times per week.  Today's visit was #: 23 Starting weight: 230 lbs Starting date: 06/18/2017 Today's weight: 194 lbs Today's date: 03/15/2020 Total lbs lost to date: 36 Total lbs lost since last in-office visit: 20  Interim History: [name] [name] has done very well with weight loss since his last visit. His average daily calories are in the 1600 kcal range, and most days his sugar intake and protein intake are at goal. He is exercising 2 to 3 hours per day on an average.  Subjective:   1. Vitamin D deficiency [name] [name]'s last Vit D level is at goal, and he is now at risk of over-replacement.  2. Pre-diabetes [name] [name] has been doing very well with diet and weight loss. He is due for labs.  3. Other hyperlipidemia [name] [name] is working on diet and exercise and he is due for labs.  Assessment/Plan:   1. Vitamin D deficiency Low Vitamin D level contributes to fatigue and are associated with obesity, breast, and colon cancer. We will check labs today, and [name] [name] will follow-up for routine testing of Vitamin D, at least 2-3 times per year to avoid over-replacement.  - VITAMIN D 25 Hydroxy (Vit-D Deficiency, Fractures)  2. Pre-diabetes [name] [name] will continue to work on weight loss, exercise, and decreasing simple carbohydrates to help decrease the risk of diabetes. We will check labs today and will follow up.   - Comprehensive metabolic panel - Hemoglobin A1c - Insulin, random  3. Other hyperlipidemia Cardiovascular risk and specific lipid/LDL goals reviewed. We discussed several lifestyle modifications today and [name] [name] will continue  to work on diet, exercise and weight loss efforts. We will check labs today. Orders and follow up as documented in patient record.   Counseling Intensive lifestyle modifications are the first line treatment for this issue.  Dietary changes: Increase soluble fiber. Decrease simple carbohydrates.  Exercise changes: Moderate to vigorous-intensity aerobic activity 150 minutes per week if tolerated.  Lipid-lowering medications: see documented in medical record.  - Lipid panel  4. Class 1 obesity with serious comorbidity and body mass index (BMI) of 30.0 to 30.9 in adult, unspecified obesity type [name] [name] is currently in the action stage of change. As such, his goal is to continue with weight loss efforts. He has agreed to keeping a food journal and adhering to recommended goals of 1700-2000 calories and 120+ grams of protein daily.   Exercise goals: As is.  Behavioral modification strategies: meal planning and cooking strategies.  [name] [name] has agreed to follow-up with our clinic in 4 to 6 weeks. He was informed of the importance of frequent follow-up visits to maximize his success with intensive lifestyle modifications for his multiple health conditions.   [name] [name] was informed we would discuss his lab results at his next visit unless there is a critical issue that needs to be addressed sooner. [name] [name] agreed to keep his next visit at the agreed upon time to discuss these results.  Objective:   Blood pressure 134/84, pulse 71, temperature 98.1 F (36.7 C), temperature source Oral, height 5\' 9"  (1.753 m), weight 194 lb (88 kg), SpO2 96 %. Body  mass index is 28.65 kg/m.  General: Cooperative, alert, well developed, in no acute distress. HEENT: Conjunctivae and lids unremarkable. Cardiovascular: Regular rhythm.  Lungs: Normal work of breathing. Neurologic: No focal deficits.   Lab Results  Component Value Date   CREATININE 1.28 (H) 03/15/2020   BUN 36 (H) 03/15/2020   NA 141 03/15/2020   K 4.5  03/15/2020   CL 100 03/15/2020   CO2 23 03/15/2020   Lab Results  Component Value Date   ALT 22 03/15/2020   AST 24 03/15/2020   ALKPHOS 51 03/15/2020   BILITOT 0.4 03/15/2020   Lab Results  Component Value Date   HGBA1C 5.3 03/15/2020   HGBA1C 5.7 (H) 06/19/2019   HGBA1C 5.5 11/21/2017   HGBA1C 5.8 (H) 06/18/2017   Lab Results  Component Value Date   INSULIN 5.0 03/15/2020   INSULIN 8.5 06/19/2019   INSULIN 8.6 11/21/2017   INSULIN 16.5 06/18/2017   Lab Results  Component Value Date   TSH 2.150 06/19/2019   Lab Results  Component Value Date   CHOL 233 (H) 03/15/2020   HDL 84 03/15/2020   LDLCALC 137 (H) 03/15/2020   LDLDIRECT 135.0 07/16/2013   TRIG 72 03/15/2020   CHOLHDL 2.8 03/15/2020   Lab Results  Component Value Date   WBC 5.5 06/18/2017   HGB 15.7 06/18/2017   HCT 45.0 06/18/2017   MCV 98 (H) 06/18/2017   PLT 313.0 07/31/2014   No results found for: IRON, TIBC, FERRITIN  Obesity Behavioral Intervention Documentation for Insurance:   Approximately 15 minutes were spent on the discussion below.  ASK: We discussed the diagnosis of obesity with [name] [name] today and [name] [name] agreed to give [name] permission to discuss obesity behavioral modification therapy today.  ASSESS: [name] [name] has the diagnosis of obesity and his BMI today is 28.64. [name] [name] is in the action stage of change.   ADVISE: [name] [name] was educated on the multiple health risks of obesity as well as the benefit of weight loss to improve his health. He was advised of the need for long term treatment and the importance of lifestyle modifications to improve his current health and to decrease his risk of future health problems.  AGREE: Multiple dietary modification options and treatment options were discussed and [name] [name] agreed to follow the recommendations documented in the above note.  ARRANGE: [name] [name] was educated on the importance of frequent visits to treat obesity as outlined per CMS and USPSTF guidelines and  agreed to schedule his next follow up appointment today.  Attestation Statements:   Reviewed by clinician on day of visit: allergies, medications, problem list, medical history, surgical history, family history, social history, and previous encounter notes.   I, Trixie Dredge, am acting as transcriptionist for Dennard Nip, MD.  I have reviewed the above documentation for accuracy and completeness, and I agree with the above. -  Dennard Nip, MD

## 2020-03-17 ENCOUNTER — Encounter: Payer: Self-pay | Admitting: Family Medicine

## 2020-03-17 ENCOUNTER — Encounter (INDEPENDENT_AMBULATORY_CARE_PROVIDER_SITE_OTHER): Payer: Self-pay | Admitting: Family Medicine

## 2020-03-17 NOTE — Telephone Encounter (Signed)
Please review

## 2020-03-18 NOTE — Telephone Encounter (Signed)
Please advise 

## 2020-03-22 ENCOUNTER — Other Ambulatory Visit: Payer: Self-pay | Admitting: Family Medicine

## 2020-03-22 NOTE — Telephone Encounter (Signed)
Pt has a CPE  appointment on 03/24/2020 with Dr Volanda [name]

## 2020-03-22 NOTE — Telephone Encounter (Signed)
Pt has been scheduled for CPE on 03/24/2020 at 8.30 am

## 2020-03-24 ENCOUNTER — Encounter: Payer: Self-pay | Admitting: Family Medicine

## 2020-03-24 ENCOUNTER — Ambulatory Visit (INDEPENDENT_AMBULATORY_CARE_PROVIDER_SITE_OTHER): Payer: Medicare HMO | Admitting: Family Medicine

## 2020-03-24 ENCOUNTER — Other Ambulatory Visit: Payer: Self-pay

## 2020-03-24 VITALS — BP 104/78 | HR 66 | Temp 97.8°F | Ht 69.0 in | Wt 202.0 lb

## 2020-03-24 DIAGNOSIS — G4733 Obstructive sleep apnea (adult) (pediatric): Secondary | ICD-10-CM

## 2020-03-24 DIAGNOSIS — Z125 Encounter for screening for malignant neoplasm of prostate: Secondary | ICD-10-CM | POA: Diagnosis not present

## 2020-03-24 DIAGNOSIS — Z9989 Dependence on other enabling machines and devices: Secondary | ICD-10-CM | POA: Diagnosis not present

## 2020-03-24 DIAGNOSIS — Z8669 Personal history of other diseases of the nervous system and sense organs: Secondary | ICD-10-CM

## 2020-03-24 DIAGNOSIS — E782 Mixed hyperlipidemia: Secondary | ICD-10-CM

## 2020-03-24 DIAGNOSIS — Z0000 Encounter for general adult medical examination without abnormal findings: Secondary | ICD-10-CM

## 2020-03-24 NOTE — Progress Notes (Signed)
Subjective:     [name] [name] is a 69 y.o. male and is here for a comprehensive physical exam. The patient reports problems - migriaines, weight, cholesterol, kidney function.  Patient notes some improvement in migraines since switching from Aimovig to Spicer.  Had 2 migraines in June with a 28-day migraine free period.  Pt was having 8 or more migraines per month.  Still Elavil, Cymbalta, Neurontin, Imitrex, Zanaflex for migraine prevention.  Followed by Dr. Lavell Anchors, neurology.  Pt walking 4-5 miles per day and doing keto diet.  Pt lost over 40 pounds since February with diet and exercise.  Pt has weight goal of 170-175 lbs.  Pt followed by weight management.  Had labs on 03/15/2020 including cholesterol, vitamin D, CMP, hemoglobin.  GFR slightly decreased in total cholesterol elevated at 233.  Patient inquires on how to improve these numbers.  Pt notes slightly decreased urine stream.  Denies hematuria, incomplete bladder emptying, urinary frequency, nocturia since starting CPAP for OSA.Marland Kitchen  Patient had vagina Covid vaccine x 2.  Colonoscopy up-to-date.  Pt has a spreadsheet of his caloric intake, exercise, CPAP usage, migraines in the last month.  Social History   Socioeconomic History  . Marital status: Married    Spouse name: Not on file  . Number of children: 1  . Years of education: 16+  . Highest education level: Bachelor's degree (e.g., BA, AB, BS)  Occupational History  . Occupation: Retired  Tobacco Use  . Smoking status: Never Smoker  . Smokeless tobacco: Never Used  Vaping Use  . Vaping Use: Never used  Substance and Sexual Activity  . Alcohol use: No    Alcohol/week: 0.0 standard drinks  . Drug use: No  . Sexual activity: Yes    Partners: Female    Birth control/protection: None  Other Topics Concern  . Not on file  Social History Narrative   Lives at home w/ his wife   Right-handed   Caffeine: 2 cups of coffee each morning   Social Determinants of Health   Financial  Resource Strain:   . Difficulty of Paying Living Expenses:   Food Insecurity:   . Worried About Charity fundraiser in the Last Year:   . Arboriculturist in the Last Year:   Transportation Needs:   . Film/video editor (Medical):   Marland Kitchen Lack of Transportation (Non-Medical):   Physical Activity:   . Days of Exercise per Week:   . Minutes of Exercise per Session:   Stress:   . Feeling of Stress :   Social Connections:   . Frequency of Communication with Friends and Family:   . Frequency of Social Gatherings with Friends and Family:   . Attends Religious Services:   . Active Member of Clubs or Organizations:   . Attends Archivist Meetings:   Marland Kitchen Marital Status:   Intimate Partner Violence:   . Fear of Current or Ex-Partner:   . Emotionally Abused:   Marland Kitchen Physically Abused:   . Sexually Abused:    Health Maintenance  Topic Date Due  . Hepatitis C Screening  Never done  . COVID-19 Vaccine (1) Never done  . INFLUENZA VACCINE  04/25/2020  . TETANUS/TDAP  03/26/2021  . COLONOSCOPY  11/03/2027  . PNA vac Low Risk Adult  Completed    The following portions of the patient's history were reviewed and updated as appropriate: allergies, current medications, past family history, past medical history, past social history, past surgical history  and problem list.  Review of Systems Pertinent items noted in HPI and remainder of comprehensive ROS otherwise negative.   Objective:    BP 104/78 (BP Location: Left Arm, Patient Position: Sitting, Cuff Size: Large)   Pulse 66   Temp 97.8 F (36.6 C) (Temporal)   Ht 5\' 9"  (1.753 m)   Wt 202 lb (91.6 kg)   SpO2 96%   BMI 29.83 kg/m  General appearance: alert, cooperative and no distress Head: Normocephalic, without obvious abnormality, atraumatic Eyes: conjunctivae/corneas clear. PERRL, EOM's intact. Fundi benign. Ears: normal TM's and external ear canals both ears Nose: Nares normal. Septum midline. Mucosa normal. No drainage or  sinus tenderness. Throat: lips, mucosa, and tongue normal; teeth and gums normal Neck: no adenopathy, no carotid bruit, no JVD, supple, symmetrical, trachea midline and thyroid not enlarged, symmetric, no tenderness/mass/nodules Back: symmetric, no curvature. [name] normal. No CVA tenderness. Lungs: clear to auscultation bilaterally Heart: regular rate and rhythm, S1, S2 normal, no murmur, click, rub or gallop Abdomen: soft, non-tender; bowel sounds normal; no masses,  no organomegaly Male genitalia: Deferred per patient request Extremities: extremities normal, atraumatic, no cyanosis or edema Pulses: 2+ and symmetric Skin: Skin color, texture, turgor normal. No rashes or lesions Lymph nodes: Cervical, supraclavicular, and axillary nodes normal. Neurologic: Alert and oriented X 3, normal strength and tone. Normal symmetric reflexes. Normal coordination and gait    Assessment:    Healthy male exam.      Plan:     Anticipatory guidance given including wearing seatbelts, smoke detectors in the home, increasing physical activity, increasing p.o. intake of water and vegetables. -Labs reviewed from 03/12/2020.  GFR 57, baseline 60s and creatinine 1.28 -We will recheck renal function and obtain PSA in 1 month. -Hydration encouraged -Vitamin D normal at 89.1 -Hemoglobin A1c 5.3% on 03/15/2020 -Continue follow-up with weight management -Colonoscopy up-to-date -Covid vaccine completed.  Will provide copy of vaccine record to update chart. -Given handouts -Next CPE in 1 year See After Visit Summary for Counseling Recommendations    History of migraine -Continue current medications including Ajovy, Cymbalta, Elavil, tizanidine -Discussed ways to reduce stress and prevent -Followed by neurology, Dr. Lavell Anchors  OSA on CPAP -Continue nightly CPAP use  Screening for prostate cancer  - Plan: PSA  Mixed hyperlipidemia  -Continue lifestyle modifications -Continue pravastatin 40 mg -Total  cholesterol 233 on 03/15/2020 -Given handout - Plan: Lipid panel  Follow-up as needed  Grier Mitts, MD

## 2020-03-24 NOTE — Patient Instructions (Signed)
Preventive Care 69 Years and Older, Male Preventive care refers to lifestyle choices and visits with your health care provider that can promote health and wellness. This includes:  A yearly physical exam. This is also called an annual well check.  Regular dental and eye exams.  Immunizations.  Screening for certain conditions.  Healthy lifestyle choices, such as diet and exercise. What can I expect for my preventive care visit? Physical exam Your health care provider will check:  Height and weight. These may be used to calculate body mass index (BMI), which is a measurement that tells if you are at a healthy weight.  Heart rate and blood pressure.  Your skin for abnormal spots. Counseling Your health care provider may ask you questions about:  Alcohol, tobacco, and drug use.  Emotional well-being.  Home and relationship well-being.  Sexual activity.  Eating habits.  History of falls.  Memory and ability to understand (cognition).  Work and work Statistician. What immunizations do I need?  Influenza (flu) vaccine  This is recommended every year. Tetanus, diphtheria, and pertussis (Tdap) vaccine  You may need a Td booster every 10 years. Varicella (chickenpox) vaccine  You may need this vaccine if you have not already been vaccinated. Zoster (shingles) vaccine  You may need this after age 69. Pneumococcal conjugate (PCV13) vaccine  One dose is recommended after age 40. Pneumococcal polysaccharide (PPSV23) vaccine  One dose is recommended after age 24. Measles, mumps, and rubella (MMR) vaccine  You may need at least one dose of MMR if you were born in 1957 or later. You may also need a second dose. Meningococcal conjugate (MenACWY) vaccine  You may need this if you have certain conditions. Hepatitis A vaccine  You may need this if you have certain conditions or if you travel or work in places where you may be exposed to hepatitis A. Hepatitis B  vaccine  You may need this if you have certain conditions or if you travel or work in places where you may be exposed to hepatitis B. Haemophilus influenzae type b (Hib) vaccine  You may need this if you have certain conditions. You may receive vaccines as individual doses or as more than one vaccine together in one shot (combination vaccines). Talk with your health care provider about the risks and benefits of combination vaccines. What tests do I need? Blood tests  Lipid and cholesterol levels. These may be checked every 5 years, or more frequently depending on your overall health.  Hepatitis C test.  Hepatitis B test. Screening  Lung cancer screening. You may have this screening every year starting at age 67 if you have a 30-pack-year history of smoking and currently smoke or have quit within the past 15 years.  Colorectal cancer screening. All adults should have this screening starting at age 77 and continuing until age 8. Your health care provider may recommend screening at age 74 if you are at increased risk. You will have tests every 1-10 years, depending on your results and the type of screening test.  Prostate cancer screening. Recommendations will vary depending on your family history and other risks.  Diabetes screening. This is done by checking your blood sugar (glucose) after you have not eaten for a while (fasting). You may have this done every 1-3 years.  Abdominal aortic aneurysm (AAA) screening. You may need this if you are a current or former smoker.  Sexually transmitted disease (STD) testing. Follow these instructions at home: Eating and drinking  Eat  a diet that includes fresh fruits and vegetables, whole grains, lean protein, and low-fat dairy products. Limit your intake of foods with high amounts of sugar, saturated fats, and salt.  Take vitamin and mineral supplements as recommended by your health care provider.  Do not drink alcohol if your health care  provider tells you not to drink.  If you drink alcohol: ? Limit how much you have to 0-2 drinks a day. ? Be aware of how much alcohol is in your drink. In the [place] one drink equals one 12 oz bottle of beer (355 mL), one 5 oz glass of wine (148 mL), or one 1 oz glass of hard liquor (44 mL). Lifestyle  Take daily care of your teeth and gums.  Stay active. Exercise for at least 30 minutes on 5 or more days each week.  Do not use any products that contain nicotine or tobacco, such as cigarettes, e-cigarettes, and chewing tobacco. If you need help quitting, ask your health care provider.  If you are sexually active, practice safe sex. Use a condom or other form of protection to prevent STIs (sexually transmitted infections).  Talk with your health care provider about taking a low-dose aspirin or statin. What's next?  Visit your health care provider once a year for a well check visit.  Ask your health care provider how often you should have your eyes and teeth checked.  Stay up to date on all vaccines. This information is not intended to replace advice given to you by your health care provider. Make sure you discuss any questions you have with your health care provider. Document Revised: 09/05/2018 Document Reviewed: 09/05/2018 Elsevier Patient Education  [place].  Cholesterol Content in Foods Cholesterol is a waxy, fat-like substance that helps to carry fat in the blood. The body needs cholesterol in small amounts, but too much cholesterol can cause damage to the arteries and heart. Most people should eat less than 200 milligrams (mg) of cholesterol a day. Foods with cholesterol  Cholesterol is found in animal-based foods, such as meat, seafood, and dairy. Generally, low-fat dairy and lean meats have less cholesterol than full-fat dairy and fatty meats. The milligrams of cholesterol per serving (mg per serving) of common cholesterol-containing foods are listed below. Meat and  other proteins  Egg -- one large whole egg has 186 mg.  Veal shank -- 4 oz has 141 mg.  Lean ground [place] (93% lean) -- 4 oz has 118 mg.  Fat-trimmed lamb loin -- 4 oz has 106 mg.  Lean ground beef (90% lean) -- 4 oz has 100 mg.  Lobster -- 3.5 oz has 90 mg.  Pork loin chops -- 4 oz has 86 mg.  Canned salmon -- 3.5 oz has 83 mg.  Fat-trimmed beef top loin -- 4 oz has 78 mg.  Frankfurter -- 1 frank (3.5 oz) has 77 mg.  Crab -- 3.5 oz has 71 mg.  Roasted chicken without skin, white meat -- 4 oz has 66 mg.  Light bologna -- 2 oz has 45 mg.  Deli-cut [place] -- 2 oz has 31 mg.  Canned tuna -- 3.5 oz has 31 mg.  Berniece Salines -- 1 oz has 29 mg.  Oysters and mussels (raw) -- 3.5 oz has 25 mg.  Mackerel -- 1 oz has 22 mg.  Trout -- 1 oz has 20 mg.  Pork sausage -- 1 link (1 oz) has 17 mg.  Salmon -- 1 oz has 16 mg.  Tilapia --  1 oz has 14 mg. Dairy  Soft-serve ice cream --  cup (4 oz) has 103 mg.  Whole-milk yogurt -- 1 cup (8 oz) has 29 mg.  Cheddar cheese -- 1 oz has 28 mg.  American cheese -- 1 oz has 28 mg.  Whole milk -- 1 cup (8 oz) has 23 mg.  2% milk -- 1 cup (8 oz) has 18 mg.  Cream cheese -- 1 tablespoon (Tbsp) has 15 mg.  Cottage cheese --  cup (4 oz) has 14 mg.  Low-fat (1%) milk -- 1 cup (8 oz) has 10 mg.  Sour cream -- 1 Tbsp has 8.5 mg.  Low-fat yogurt -- 1 cup (8 oz) has 8 mg.  Nonfat Greek yogurt -- 1 cup (8 oz) has 7 mg.  Half-and-half cream -- 1 Tbsp has 5 mg. Fats and oils  Cod liver oil -- 1 tablespoon (Tbsp) has 82 mg.  Butter -- 1 Tbsp has 15 mg.  Lard -- 1 Tbsp has 14 mg.  Bacon grease -- 1 Tbsp has 14 mg.  Mayonnaise -- 1 Tbsp has 5-10 mg.  Margarine -- 1 Tbsp has 3-10 mg. Exact amounts of cholesterol in these foods may vary depending on specific ingredients and brands. Foods without cholesterol Most plant-based foods do not have cholesterol unless you combine them with a food that has cholesterol. Foods without  cholesterol include:  Grains and cereals.  Vegetables.  Fruits.  Vegetable oils, such as olive, canola, and sunflower oil.  Legumes, such as peas, beans, and lentils.  Nuts and seeds.  Egg whites. Summary  The body needs cholesterol in small amounts, but too much cholesterol can cause damage to the arteries and heart.  Most people should eat less than 200 milligrams (mg) of cholesterol a day. This information is not intended to replace advice given to you by your health care provider. Make sure you discuss any questions you have with your health care provider. Document Revised: 08/24/2017 Document Reviewed: 05/08/2017 Elsevier Patient Education  [place].  Preventing High Cholesterol Cholesterol is a white, waxy substance similar to fat that the human body needs to help build cells. The liver makes all the cholesterol that a person's body needs. Having high cholesterol (hypercholesterolemia) increases a person's risk for heart disease and stroke. Extra (excess) cholesterol comes from the food the person eats. High cholesterol can often be prevented with diet and lifestyle changes. If you already have high cholesterol, you can control it with diet and lifestyle changes and with medicine. How can high cholesterol affect me? If you have high cholesterol, deposits (plaques) may build up on the walls of your arteries. The arteries are the blood vessels that carry blood away from your heart. Plaques make the arteries narrower and stiffer. This can limit or block blood flow and cause blood clots to form. Blood clots:  Are tiny balls of cells that form in your blood.  Can move to the heart or brain, causing a heart attack or stroke. Plaques in arteries greatly increase your risk for heart attack and stroke.Making diet and lifestyle changes can reduce your risk for these conditions that may threaten your life. What can increase my risk? This condition is more likely to develop in  people who:  Eat foods that are high in saturated fat or cholesterol. Saturated fat is mostly found in: ? Foods that contain animal fat, such as red meat and some dairy products. ? Certain fatty foods made from plants, such as tropical  oils.  Are overweight.  Are not getting enough exercise.  Have a family history of high cholesterol. What actions can I take to prevent this? Nutrition   Eat less saturated fat.  Avoid trans fats (partially hydrogenated oils). These are often found in margarine and in some baked goods, fried foods, and snacks bought in packages.  Avoid precooked or cured meat, such as sausages or meat loaves.  Avoid foods and drinks that have added sugars.  Eat more fruits, vegetables, and whole grains.  Choose healthy sources of protein, such as fish, poultry, lean cuts of red meat, beans, peas, lentils, and nuts.  Choose healthy sources of fat, such as: ? Nuts. ? Vegetable oils, especially olive oil. ? Fish that have healthy fats (omega-3 fatty acids), such as mackerel or salmon. The items listed above may not be a complete list of recommended foods and beverages. Contact a dietitian for more information. Lifestyle  Lose weight if you are overweight. Losing 5-10 lb (2.3-4.5 kg) can help prevent or control high cholesterol. It can also lower your risk for diabetes and high blood pressure. Ask your health care provider to help you with a diet and exercise plan to lose weight safely.  Do not use any products that contain nicotine or tobacco, such as cigarettes, e-cigarettes, and chewing tobacco. If you need help quitting, ask your health care provider.  Limit your alcohol intake. ? Do not drink alcohol if:  Your health care provider tells you not to drink.  You are pregnant, may be pregnant, or are planning to become pregnant. ? If you drink alcohol:  Limit how much you use to:  0-1 drink a day for women.  0-2 drinks a day for men.  Be aware of how  much alcohol is in your drink. In the [place] one drink equals one 12 oz bottle of beer (355 mL), one 5 oz glass of wine (148 mL), or one 1 oz glass of hard liquor (44 mL). Activity   Get enough exercise. Each week, do at least 150 minutes of exercise that takes a medium level of effort (moderate-intensity exercise). ? This is exercise that:  Makes your heart beat faster and makes you breathe harder than usual.  Allows you to still be able to talk. ? You could exercise in short sessions several times a day or longer sessions a few times a week. For example, on 5 days each week, you could walk fast or ride your bike 3 times a day for 10 minutes each time.  Do exercises as told by your health care provider. Medicines  In addition to diet and lifestyle changes, your health care provider may recommend medicines to help lower cholesterol. This may be a medicine to lower the amount of cholesterol your liver makes. You may need medicine if: ? Diet and lifestyle changes do not lower your cholesterol enough. ? You have high cholesterol and other risk factors for heart disease or stroke.  Take over-the-counter and prescription medicines only as told by your health care provider. General information  Manage your risk factors for high cholesterol. Talk with your health care provider about all your risk factors and how to lower your risk.  Manage other conditions that you have, such as diabetes or high blood pressure (hypertension).  Have blood tests to check your cholesterol levels at regular points in time as told by your health care provider.  Keep all follow-up visits as told by your health care provider. This is important.  Where to find more information  American Heart Association: www.heart.org  National Heart, Lung, and Blood Institute: https://wilson-eaton.com/ Summary  High cholesterol increases your risk for heart disease and stroke. By keeping your cholesterol level low, you can reduce your  risk for these conditions.  High cholesterol can often be prevented with diet and lifestyle changes.  Work with your health care provider to manage your risk factors, and have your blood tested regularly. This information is not intended to replace advice given to you by your health care provider. Make sure you discuss any questions you have with your health care provider. Document Revised: 01/03/2019 Document Reviewed: 05/20/2016 Elsevier Patient Education  2020 [employer].

## 2020-03-26 ENCOUNTER — Encounter: Payer: Self-pay | Admitting: Family Medicine

## 2020-03-28 ENCOUNTER — Other Ambulatory Visit: Payer: Self-pay | Admitting: Family Medicine

## 2020-03-31 ENCOUNTER — Encounter: Payer: Self-pay | Admitting: Family Medicine

## 2020-04-05 ENCOUNTER — Telehealth: Payer: Self-pay | Admitting: Neurology

## 2020-04-05 ENCOUNTER — Encounter: Payer: Self-pay | Admitting: Adult Health

## 2020-04-05 NOTE — Telephone Encounter (Signed)
Please talk to [name] [name]. There are two questions that you ask and then can inform the patient how to proceed

## 2020-04-05 NOTE — Telephone Encounter (Signed)
Ajovy prescription and service request form completed and ready for Dr. Cathren Laine signature. Will need to be faxed to [facility] at [phone].

## 2020-04-05 NOTE — Telephone Encounter (Signed)
Prescription and service request form faxed to [facility]. Received a receipt of confirmation.

## 2020-04-05 NOTE — Telephone Encounter (Signed)
[name] [name] from [facility] called and LVM stating that on the Ajovy RX the pin code was not marked and she would like to know if that information can be called in to [phone] option 3

## 2020-04-06 NOTE — Telephone Encounter (Signed)
Until [facility] opens this AM and I can call, I went ahead and made sure all sections were completed on the prescription form and I faxed back to [facility]. I do not see any section labeled "pin code". I will call [facility] to inquire.

## 2020-04-06 NOTE — Telephone Encounter (Signed)
I called pt and relayed that RESMED machines are not affected by this recall.  He may continue to use this and the soclean cleaner.  He does not keep his machine in heat for periods of time.  He appreciated call.

## 2020-04-06 NOTE — Telephone Encounter (Signed)
I spoke with Melissa at [facility] [phone]. They needed the ICD 10 code. I provided this verbally, G43.711. I was told they have everything they need to proceed. IF patient is approved the medication will be free for 1 year.

## 2020-04-07 NOTE — Telephone Encounter (Signed)
Pt has called for Bethany,RN to inform that a PA is needed for his Fremanezumab-vfrm (AJOVY) 225 MG/1.5ML SOAJ.  Pt states he was told once approved that needs to be faxed to [facility] @ [phone]

## 2020-04-07 NOTE — Telephone Encounter (Signed)
Approval letter has been received. Letter has been faxed to [facility].

## 2020-04-07 NOTE — Telephone Encounter (Signed)
Called [employer] and requested to have the approval letter faxed to [facility]'s fax. Will await fax.

## 2020-04-16 NOTE — Telephone Encounter (Signed)
See phone note in pt's wife chart - this has been handled.

## 2020-04-16 NOTE — Telephone Encounter (Signed)
Though convenient, my chart is not the best way to deal with all issues.  It sounds like [name] [name] would benefit from an in person visit.  This can be arranged with the respiratory clinic for persistent symptoms.  Also refills on controlled medications are not appropriate for outpatient being seen.

## 2020-04-21 ENCOUNTER — Ambulatory Visit (INDEPENDENT_AMBULATORY_CARE_PROVIDER_SITE_OTHER): Payer: Medicare HMO | Admitting: Family Medicine

## 2020-04-26 NOTE — Addendum Note (Signed)
Addended by: Marrion Coy on: 04/26/2020 03:33 PM   Modules accepted: Orders

## 2020-04-27 ENCOUNTER — Encounter: Payer: Self-pay | Admitting: Family Medicine

## 2020-05-06 ENCOUNTER — Other Ambulatory Visit: Payer: Self-pay | Admitting: Family Medicine

## 2020-05-06 DIAGNOSIS — Z981 Arthrodesis status: Secondary | ICD-10-CM

## 2020-05-06 DIAGNOSIS — Z96611 Presence of right artificial shoulder joint: Secondary | ICD-10-CM | POA: Insufficient documentation

## 2020-05-06 DIAGNOSIS — Z96612 Presence of left artificial shoulder joint: Secondary | ICD-10-CM | POA: Insufficient documentation

## 2020-05-06 DIAGNOSIS — Z87312 Personal history of (healed) stress fracture: Secondary | ICD-10-CM

## 2020-05-06 DIAGNOSIS — Z96651 Presence of right artificial knee joint: Secondary | ICD-10-CM | POA: Insufficient documentation

## 2020-05-06 DIAGNOSIS — Z8639 Personal history of other endocrine, nutritional and metabolic disease: Secondary | ICD-10-CM

## 2020-05-17 ENCOUNTER — Other Ambulatory Visit: Payer: Medicare HMO

## 2020-05-19 ENCOUNTER — Ambulatory Visit (INDEPENDENT_AMBULATORY_CARE_PROVIDER_SITE_OTHER): Payer: Medicare HMO | Admitting: Family Medicine

## 2020-05-24 ENCOUNTER — Other Ambulatory Visit: Payer: Self-pay

## 2020-05-24 ENCOUNTER — Ambulatory Visit (INDEPENDENT_AMBULATORY_CARE_PROVIDER_SITE_OTHER): Payer: Medicare HMO

## 2020-05-24 ENCOUNTER — Encounter: Payer: Self-pay | Admitting: Family Medicine

## 2020-05-24 DIAGNOSIS — Z0000 Encounter for general adult medical examination without abnormal findings: Secondary | ICD-10-CM | POA: Diagnosis not present

## 2020-05-24 NOTE — Patient Instructions (Signed)
[name] [name] , Thank you for taking time to come for your Medicare Wellness Visit. I appreciate your ongoing commitment to your health goals. Please review the following plan we discussed and let me know if I can assist you in the future.   Screening recommendations/referrals: Colonoscopy: Up to date, next due 11/01/2027 Recommended yearly ophthalmology/optometry visit for glaucoma screening and checkup Recommended yearly dental visit for hygiene and checkup  Vaccinations: Influenza vaccine: Up to date, next due this flu season Pneumococcal vaccine: Completed series Tdap vaccine: Up to date, next due 03/26/2021 Shingles vaccine: Currently due for Shingrix vaccine, please contact your pharmacy or your insurance company to discuss cost. This vaccine is cheaper at your pharmacy.    Advanced directives: Please bring a copy of your living will to your next appointment so that we may scan it into your chart.  Conditions/risks identified: None   Next appointment: 06/09/2020 @ 8:00 am for Labs  Preventive Care 65 Years and Older, Male Preventive care refers to lifestyle choices and visits with your health care provider that can promote health and wellness. What does preventive care include?  A yearly physical exam. This is also called an annual well check.  Dental exams once or twice a year.  Routine eye exams. Ask your health care provider how often you should have your eyes checked.  Personal lifestyle choices, including:  Daily care of your teeth and gums.  Regular physical activity.  Eating a healthy diet.  Avoiding tobacco and drug use.  Limiting alcohol use.  Practicing safe sex.  Taking low doses of aspirin every day.  Taking vitamin and mineral supplements as recommended by your health care provider. What happens during an annual well check? The services and screenings done by your health care provider during your annual well check will depend on your age, overall  health, lifestyle risk factors, and family history of disease. Counseling  Your health care provider may ask you questions about your:  Alcohol use.  Tobacco use.  Drug use.  Emotional well-being.  Home and relationship well-being.  Sexual activity.  Eating habits.  History of falls.  Memory and ability to understand (cognition).  Work and work Statistician. Screening  You may have the following tests or measurements:  Height, weight, and BMI.  Blood pressure.  Lipid and cholesterol levels. These may be checked every 5 years, or more frequently if you are over 57 years old.  Skin check.  Lung cancer screening. You may have this screening every year starting at age 15 if you have a 30-pack-year history of smoking and currently smoke or have quit within the past 15 years.  Fecal occult blood test (FOBT) of the stool. You may have this test every year starting at age 59.  Flexible sigmoidoscopy or colonoscopy. You may have a sigmoidoscopy every 5 years or a colonoscopy every 10 years starting at age 105.  Prostate cancer screening. Recommendations will vary depending on your family history and other risks.  Hepatitis C blood test.  Hepatitis B blood test.  Sexually transmitted disease (STD) testing.  Diabetes screening. This is done by checking your blood sugar (glucose) after you have not eaten for a while (fasting). You may have this done every 1-3 years.  Abdominal aortic aneurysm (AAA) screening. You may need this if you are a current or former smoker.  Osteoporosis. You may be screened starting at age 66 if you are at high risk. Talk with your health care provider about your test results,  treatment options, and if necessary, the need for more tests. Vaccines  Your health care provider may recommend certain vaccines, such as:  Influenza vaccine. This is recommended every year.  Tetanus, diphtheria, and acellular pertussis (Tdap, Td) vaccine. You may need a Td  booster every 10 years.  Zoster vaccine. You may need this after age 46.  Pneumococcal 13-valent conjugate (PCV13) vaccine. One dose is recommended after age 83.  Pneumococcal polysaccharide (PPSV23) vaccine. One dose is recommended after age 68. Talk to your health care provider about which screenings and vaccines you need and how often you need them. This information is not intended to replace advice given to you by your health care provider. Make sure you discuss any questions you have with your health care provider. Document Released: 10/08/2015 Document Revised: 05/31/2016 Document Reviewed: 07/13/2015 Elsevier Interactive Patient Education  2017 [place] Prevention in the Home Falls can cause injuries. They can happen to people of all ages. There are many things you can do to make your home safe and to help prevent falls. What can I do on the outside of my home?  Regularly fix the edges of walkways and driveways and fix any cracks.  Remove anything that might make you trip as you walk through a door, such as a raised step or threshold.  Trim any bushes or trees on the path to your home.  Use bright outdoor lighting.  Clear any walking paths of anything that might make someone trip, such as rocks or tools.  Regularly check to see if handrails are loose or broken. Make sure that both sides of any steps have handrails.  Any raised decks and porches should have guardrails on the edges.  Have any leaves, snow, or ice cleared regularly.  Use sand or salt on walking paths during winter.  Clean up any spills in your garage right away. This includes oil or grease spills. What can I do in the bathroom?  Use night lights.  Install grab bars by the toilet and in the tub and shower. Do not use towel bars as grab bars.  Use non-skid mats or decals in the tub or shower.  If you need to sit down in the shower, use a plastic, non-slip stool.  Keep the floor dry. Clean up  any water that spills on the floor as soon as it happens.  Remove soap buildup in the tub or shower regularly.  Attach bath mats securely with double-sided non-slip rug tape.  Do not have throw rugs and other things on the floor that can make you trip. What can I do in the bedroom?  Use night lights.  Make sure that you have a light by your bed that is easy to reach.  Do not use any sheets or blankets that are too big for your bed. They should not hang down onto the floor.  Have a firm chair that has side arms. You can use this for support while you get dressed.  Do not have throw rugs and other things on the floor that can make you trip. What can I do in the kitchen?  Clean up any spills right away.  Avoid walking on wet floors.  Keep items that you use a lot in easy-to-reach places.  If you need to reach something above you, use a strong step stool that has a grab bar.  Keep electrical cords out of the way.  Do not use floor polish or wax that makes floors  slippery. If you must use wax, use non-skid floor wax.  Do not have throw rugs and other things on the floor that can make you trip. What can I do with my stairs?  Do not leave any items on the stairs.  Make sure that there are handrails on both sides of the stairs and use them. Fix handrails that are broken or loose. Make sure that handrails are as long as the stairways.  Check any carpeting to make sure that it is firmly attached to the stairs. Fix any carpet that is loose or worn.  Avoid having throw rugs at the top or bottom of the stairs. If you do have throw rugs, attach them to the floor with carpet tape.  Make sure that you have a light switch at the top of the stairs and the bottom of the stairs. If you do not have them, ask someone to add them for you. What else can I do to help prevent falls?  Wear shoes that:  Do not have high heels.  Have rubber bottoms.  Are comfortable and fit you well.  Are  closed at the toe. Do not wear sandals.  If you use a stepladder:  Make sure that it is fully opened. Do not climb a closed stepladder.  Make sure that both sides of the stepladder are locked into place.  Ask someone to hold it for you, if possible.  Clearly mark and make sure that you can see:  Any grab bars or handrails.  First and last steps.  Where the edge of each step is.  Use tools that help you move around (mobility aids) if they are needed. These include:  Canes.  Walkers.  Scooters.  Crutches.  Turn on the lights when you go into a dark area. Replace any light bulbs as soon as they burn out.  Set up your furniture so you have a clear path. Avoid moving your furniture around.  If any of your floors are uneven, fix them.  If there are any pets around you, be aware of where they are.  Review your medicines with your doctor. Some medicines can make you feel dizzy. This can increase your chance of falling. Ask your doctor what other things that you can do to help prevent falls. This information is not intended to replace advice given to you by your health care provider. Make sure you discuss any questions you have with your health care provider. Document Released: 07/08/2009 Document Revised: 02/17/2016 Document Reviewed: 10/16/2014 Elsevier Interactive Patient Education  2017 [employer].

## 2020-05-24 NOTE — Progress Notes (Signed)
Subjective:   GAEGE SANGALANG is a 69 y.o. male who presents for Medicare Annual/Subsequent preventive examination.  I connected with Luanna Salk today by telephone and verified that I am speaking with the correct person using two identifiers. Location patient: home Location provider: work Persons participating in the virtual visit: patient, provider.   I discussed the limitations, risks, security and privacy concerns of performing an evaluation and management service by telephone and the availability of in person appointments. I also discussed with the patient that there may be a patient responsible charge related to this service. The patient expressed understanding and verbally consented to this telephonic visit.    Interactive audio and video telecommunications were attempted between this provider and patient, however failed, due to patient having technical difficulties OR patient did not have access to video capability.  We continued and completed visit with audio only.      Review of Systems    N/A Cardiac Risk Factors include: advanced age (>46men, >48 women);male gender;dyslipidemia     Objective:    Today's Vitals   There is no height or weight on file to calculate BMI.  Advanced Directives 05/24/2020 11/02/2017 07/12/2017 08/23/2015 11/18/2014 09/01/2014 08/18/2014  Does Patient Have a Medical Advance Directive? Yes No Yes Yes Yes Yes Yes  Type of Advance Directive Living will - - Living will - [place];Living will;Mental Health Advance Directive;Out of facility DNR (pink MOST or yellow form) [place];Living will;Mental Health Advance Directive;Out of facility DNR (pink MOST or yellow form)  Does patient want to make changes to medical advance directive? No - Patient declined - - - Yes - information given - -  Copy of [place] in Chart? - - - No - copy requested No - copy requested - No - copy requested  Would  patient like information on creating a medical advance directive? - - - Yes - Educational materials given - - -  Pre-existing out of facility DNR order (yellow form or pink MOST form) - - - - - - -    Current Medications (verified) Outpatient Encounter Medications as of 05/24/2020  Medication Sig  . allopurinol (ZYLOPRIM) 100 MG tablet TAKE 1 TABLET EVERY DAY  . amitriptyline (ELAVIL) 100 MG tablet Take 1 tablet (100 mg total) by mouth at bedtime.  Marland Kitchen aspirin 81 MG tablet Take 81 mg by mouth daily.  . Cholecalciferol (VITAMIN D PO) Take 1 tablet by mouth daily.  . Coenzyme Q10 (CO Q 10 PO) Take by mouth daily.  . Cyanocobalamin (VITAMIN B 12 PO) Take by mouth.  . DULoxetine (CYMBALTA) 60 MG capsule Take 1 capsule (60 mg total) by mouth daily.  Marland Kitchen EPINEPHrine 0.3 mg/0.3 mL IJ SOAJ injection INJECT 0.3 MLS INTO THE MUSCLE ONCE  . Fremanezumab-vfrm (AJOVY) 225 MG/1.5ML SOAJ Inject 225 mg into the skin every 30 (thirty) days.  Marland Kitchen gabapentin (NEURONTIN) 300 MG capsule Take 1 capsule (300 mg total) by mouth 2 (two) times daily.  . methylPREDNISolone (MEDROL DOSEPAK) 4 MG TBPK tablet As directed  . Multiple Vitamin (MULTIVITAMIN) tablet Take 1 tablet by mouth daily.  . naproxen (NAPROSYN) 500 MG tablet Take 500 mg by mouth 2 (two) times daily with a meal.  . Omega-3 Fatty Acids (FISH OIL) 1000 MG CAPS Take 1 capsule by mouth daily.  . pravastatin (PRAVACHOL) 40 MG tablet TAKE 1 TABLET (40 MG TOTAL) BY MOUTH DAILY.  Marland Kitchen Psyllium (METAMUCIL PO) Take 2 tablets by mouth  2 (two) times daily.  . Red Yeast Rice Extract (RED YEAST RICE PO) Take 1 tablet by mouth daily.  . SUMAtriptan (IMITREX) 100 MG tablet TAKE 1 TABLET AS NEEDED FOR MIGRAINE. MAY REPEAT IN 2 HOURS IF HEADACHE PERSISTS OR RECURS. DO NOT EXCEED 2 TABLETS IN 24 HR  . tiZANidine (ZANAFLEX) 4 MG tablet Take 1 tablet (4 mg total) by mouth 3 (three) times daily as needed for muscle spasms.  . TURMERIC CURCUMIN PO Take 1,050 mg by mouth daily.  .  valACYclovir (VALTREX) 500 MG tablet Take 500 mg by mouth daily.  . vitamin C (ASCORBIC ACID) 500 MG tablet Take 500 mg by mouth daily.  . vitamin E 1000 UNIT capsule Take 1,000 Units by mouth daily.   Facility-Administered Encounter Medications as of 05/24/2020  Medication  . 0.9 %  sodium chloride infusion    Allergies (verified) Shrimp [shellfish allergy], Codeine, Contrast media [iodinated diagnostic agents], Diazepam, Amoxicillin, Benadryl [diphenhydramine], Depakote [divalproex sodium], Erythromycin, Flexeril [cyclobenzaprine], Allegra [fexofenadine], Betadine [povidone iodine], Celebrex [celecoxib], Meperidine and related, Methocarbamol, Neosporin [neomycin-bacitracin zn-polymyx], Oxycodone, Penicillins, Plavix [clopidogrel], Rivaroxaban, and Vioxx [rofecoxib]   History: Past Medical History:  Diagnosis Date  . ALLERGIC RHINITIS 04/22/2007  . Allergy   . Cancer (HCC)    HX SKIN CANCER  . Complication of anesthesia    more concerned about positioning of head & neck because of TMJ  . DDD (degenerative disc disease), cervical   . GERD (gastroesophageal reflux disease)    infrequently  . Headache(784.0)    migraines  . History of kidney stones   . History of skin cancer   . History of TMJ disorder   . HYPERCHOLESTEROLEMIA 04/22/2007  . HYPERLIPIDEMIA 07/29/2007  . Hyperplastic colon polyp   . NEPHROLITHIASIS, HX OF 07/29/2007  . Osteoarth NOS-Unspec 04/22/2007  . Shortness of breath   . Shoulder pain, acute    bilateral   Past Surgical History:  Procedure Laterality Date  . APPENDECTOMY    . BACK SURGERY  10/07/2015  . COLONOSCOPY     10 yrs ago- 2 HPP   . COLONOSCOPY  2019  . JOINT REPLACEMENT    . KNEE ARTHROSCOPY     right x2  . SHOULDER SURGERY     right  . skin cancer biopsy     L forehead  . TOTAL KNEE ARTHROPLASTY Right 08/25/2013   Procedure: RIGHT TOTAL KNEE ARTHROPLASTY;  Surgeon: Gearlean Alf, MD;  Location: [facility];  Service: Orthopedics;  Laterality:  Right;  . TOTAL SHOULDER ARTHROPLASTY Left 12/25/2013   DR SUPPLE   . TOTAL SHOULDER ARTHROPLASTY Left 12/25/2013   Procedure: LEFT TOTAL SHOULDER ARTHROPLASTY;  Surgeon: Marin Shutter, MD;  Location: [facility];  Service: Orthopedics;  Laterality: Left;  . TOTAL SHOULDER ARTHROPLASTY Right 06/18/2014   DR SUPPLE  . TOTAL SHOULDER ARTHROPLASTY Right 06/18/2014   Procedure: RIGHT TOTAL SHOULDER ARTHROPLASTY;  Surgeon: Marin Shutter, MD;  Location: [facility];  Service: Orthopedics;  Laterality: Right;  . WISDOM TOOTH EXTRACTION     Family History  Problem Relation Age of Onset  . COPD Father        family hx  . Emphysema Father   . Aneurysm Mother   . Stroke Mother   . Sudden death Mother   . Cancer Mother   . Obesity Mother   . Breast cancer Mother   . Cancer Maternal Grandfather        lung  . Colon cancer Neg Hx   .  Colon polyps Neg Hx    Social History   Socioeconomic History  . Marital status: Married    Spouse name: Not on file  . Number of children: 1  . Years of education: 16+  . Highest education level: Bachelor's degree (e.g., BA, AB, BS)  Occupational History  . Occupation: Retired  Tobacco Use  . Smoking status: Never Smoker  . Smokeless tobacco: Never Used  Vaping Use  . Vaping Use: Never used  Substance and Sexual Activity  . Alcohol use: No    Alcohol/week: 0.0 standard drinks  . Drug use: No  . Sexual activity: Yes    Partners: Female    Birth control/protection: None  Other Topics Concern  . Not on file  Social History Narrative   Lives at home w/ his wife   Right-handed   Caffeine: 2 cups of coffee each morning   Social Determinants of Health   Financial Resource Strain: Low Risk   . Difficulty of Paying Living Expenses: Not hard at all  Food Insecurity: No Food Insecurity  . Worried About Charity fundraiser in the Last Year: Never true  . Ran Out of Food in the Last Year: Never true  Transportation Needs: No Transportation Needs  . Lack of  Transportation (Medical): No  . Lack of Transportation (Non-Medical): No  Physical Activity: Sufficiently Active  . Days of Exercise per Week: 7 days  . Minutes of Exercise per Session: 60 min  Stress: No Stress Concern Present  . Feeling of Stress : Not at all  Social Connections: Moderately Isolated  . Frequency of Communication with Friends and Family: More than three times a week  . Frequency of Social Gatherings with Friends and Family: Once a week  . Attends Religious Services: Never  . Active Member of Clubs or Organizations: No  . Attends Archivist Meetings: Never  . Marital Status: Married    Tobacco Counseling Counseling given: Not Answered   Clinical Intake:  Pre-visit preparation completed: Yes  Pain : No/denies pain     Nutritional Risks: None Diabetes: No  How often do you need to have someone help you when you read instructions, pamphlets, or other written materials from your doctor or pharmacy?: 1 - Never What is the last grade level you completed in school?: 3 years of Masters program  Diabetic?No  Interpreter Needed?: No  Information entered by :: [place] of Daily Living In your present state of health, do you have any difficulty performing the following activities: 05/24/2020  Hearing? N  Vision? N  Difficulty concentrating or making decisions? N  Walking or climbing stairs? N  Dressing or bathing? N  Doing errands, shopping? N  Preparing Food and eating ? N  Using the Toilet? N  In the past six months, have you accidently leaked urine? N  Do you have problems with loss of bowel control? N  Managing your Medications? N  Managing your Finances? N  Housekeeping or managing your Housekeeping? N  Some recent data might be hidden    Patient Care Team: [name], MD as PCP - General (Family Medicine) Melvenia Beam, MD as Consulting Physician (Neurology) Starlyn Skeans, MD as Consulting Physician (Family  Medicine)  Indicate any recent Medical Services you may have received from other than [facility] providers in the past year (date may be approximate).     Assessment:   This is a routine wellness examination for Marcelis.  Hearing/Vision screen  Hearing Screening   125Hz  250Hz  500Hz  1000Hz  2000Hz  3000Hz  4000Hz  6000Hz  8000Hz   Right ear:           Left ear:           Vision Screening Comments: Patient states gets eyes examined every 2 years   Dietary issues and exercise activities discussed: Current Exercise Habits: Home exercise routine, Type of exercise: walking, Time (Minutes): 60, Frequency (Times/Week): 7, Weekly Exercise (Minutes/Week): 420, Intensity: Mild  Goals    . Patient Stated     I will continue to walk 5 miles per day.    . Weight (lb) < 169 lb (76.7 kg)     Will continue your weight loss efforts with [name]  Continue your exercise program       Depression Screen PHQ 2/9 Scores 05/24/2020 03/24/2020 07/12/2017 06/18/2017 08/12/2014  PHQ - 2 Score 0 0 0 4 0  PHQ- 9 Score 0 - - 8 -    Fall Risk Fall Risk  05/24/2020 03/24/2020 07/12/2017  Falls in the past year? 0 1 No  Number falls in past yr: 0 0 -  Injury with Fall? 0 0 -  Risk for fall due to : Medication side effect - -  Follow up Falls evaluation completed;Falls prevention discussed - -    Any stairs in or around the home? Yes  If so, are there any without handrails? No  Home free of loose throw rugs in walkways, pet beds, electrical cords, etc? Yes  Adequate lighting in your home to reduce risk of falls? Yes   ASSISTIVE DEVICES UTILIZED TO PREVENT FALLS:  Life alert? No  Use of a cane, walker or w/c? No  Grab bars in the bathroom? No  Shower chair or bench in shower? No  Elevated toilet seat or a handicapped toilet? No     Cognitive Function: Cognitive screening not indicated based on direct observation MMSE - Mini Mental State Exam 07/12/2017  Not completed: (No Data)         Immunizations Immunization History  Administered Date(s) Administered  . Fluad Quad(high Dose 65+) 06/19/2019  . Influenza Split 07/10/2012  . Influenza Whole 09/25/2005, 07/22/2009  . Influenza, High Dose Seasonal PF 06/30/2016, 06/12/2017, 06/20/2018  . Influenza,inj,Quad PF,6+ Mos 06/10/2013, 06/10/2014, 06/16/2015  . Influenza-Unspecified 07/10/2012, 06/10/2013, 06/10/2014, 06/16/2015  . Moderna SARS-COVID-2 Vaccination 10/25/2019, 11/22/2019  . Pneumococcal Conjugate-13 06/02/2016  . Pneumococcal Polysaccharide-23 07/12/2017  . Tdap 03/27/2011  . Zoster 06/02/2016    TDAP status: Up to date Flu Vaccine status: Up to date Pneumococcal vaccine status: Up to date Covid-19 vaccine status: Completed vaccines  Qualifies for Shingles Vaccine? Yes   Zostavax completed Yes   Shingrix Completed?: No.    Education has been provided regarding the importance of this vaccine. Patient has been advised to call insurance company to determine out of pocket expense if they have not yet received this vaccine. Advised may also receive vaccine at local pharmacy or Health Dept. Verbalized acceptance and understanding.  Screening Tests Health Maintenance  Topic Date Due  . Hepatitis C Screening  Never done  . INFLUENZA VACCINE  04/25/2020  . TETANUS/TDAP  03/26/2021  . COLONOSCOPY  11/03/2027  . COVID-19 Vaccine  Completed  . PNA vac Low Risk Adult  Completed    Health Maintenance  Health Maintenance Due  Topic Date Due  . Hepatitis C Screening  Never done  . INFLUENZA VACCINE  04/25/2020    Colorectal cancer screening: Completed 11/02/2017. Repeat every 10  years  Lung Cancer Screening: (Low Dose CT Chest recommended if Age 40-80 years, 30 pack-year currently smoking OR have quit w/in 15years.) does not qualify.   Lung Cancer Screening Referral: N/A  Additional Screening:  Hepatitis C Screening: does qualify;  Vision Screening: Recommended annual ophthalmology exams for early  detection of glaucoma and other disorders of the eye. Is the patient up to date with their annual eye exam?  Yes  Who is the provider or what is the name of the office in which the patient attends annual eye exams? [facility] If pt is not established with a provider, would they like to be referred to a provider to establish care? No .   Dental Screening: Recommended annual dental exams for proper oral hygiene  Community Resource Referral / Chronic Care Management: CRR required this visit?  No   CCM required this visit?  No      Plan:     I have personally reviewed and noted the following in the patient's chart:   . Medical and social history . Use of alcohol, tobacco or illicit drugs  . Current medications and supplements . Functional ability and status . Nutritional status . Physical activity . Advanced directives . List of other physicians . Hospitalizations, surgeries, and ER visits in previous 12 months . Vitals . Screenings to include cognitive, depression, and falls . Referrals and appointments  In addition, I have reviewed and discussed with patient certain preventive protocols, quality metrics, and best practice recommendations. A written personalized care plan for preventive services as well as general preventive health recommendations were provided to patient.     Ofilia Neas, LPN   5/70/1779   Nurse Notes: None

## 2020-05-27 ENCOUNTER — Other Ambulatory Visit: Payer: Self-pay | Admitting: Neurology

## 2020-06-02 DIAGNOSIS — G4733 Obstructive sleep apnea (adult) (pediatric): Secondary | ICD-10-CM | POA: Diagnosis not present

## 2020-06-07 ENCOUNTER — Encounter (INDEPENDENT_AMBULATORY_CARE_PROVIDER_SITE_OTHER): Payer: Self-pay | Admitting: Family Medicine

## 2020-06-09 ENCOUNTER — Other Ambulatory Visit: Payer: Self-pay

## 2020-06-09 ENCOUNTER — Other Ambulatory Visit (INDEPENDENT_AMBULATORY_CARE_PROVIDER_SITE_OTHER): Payer: Medicare HMO

## 2020-06-09 DIAGNOSIS — Z125 Encounter for screening for malignant neoplasm of prostate: Secondary | ICD-10-CM

## 2020-06-09 DIAGNOSIS — Z0000 Encounter for general adult medical examination without abnormal findings: Secondary | ICD-10-CM | POA: Diagnosis not present

## 2020-06-09 DIAGNOSIS — E782 Mixed hyperlipidemia: Secondary | ICD-10-CM

## 2020-06-10 LAB — BASIC METABOLIC PANEL
BUN/Creatinine Ratio: 27 (calc) — ABNORMAL HIGH (ref 6–22)
BUN: 37 mg/dL — ABNORMAL HIGH (ref 7–25)
CO2: 31 mmol/L (ref 20–32)
Calcium: 9.2 mg/dL (ref 8.6–10.3)
Chloride: 99 mmol/L (ref 98–110)
Creat: 1.38 mg/dL — ABNORMAL HIGH (ref 0.70–1.25)
Glucose, Bld: 100 mg/dL — ABNORMAL HIGH (ref 65–99)
Potassium: 5 mmol/L (ref 3.5–5.3)
Sodium: 137 mmol/L (ref 135–146)

## 2020-06-10 LAB — PSA: PSA: 1.97 ng/mL (ref <=4.0)

## 2020-06-10 LAB — LIPID PANEL
Cholesterol: 250 mg/dL — ABNORMAL HIGH (ref <200)
HDL: 93 mg/dL (ref >=40)
LDL Cholesterol (Calc): 142 mg/dL (calc) — ABNORMAL HIGH
Non-HDL Cholesterol (Calc): 157 mg/dL (calc) — ABNORMAL HIGH (ref <130)
Total CHOL/HDL Ratio: 2.7 (calc) (ref <5.0)
Triglycerides: 59 mg/dL (ref <150)

## 2020-06-13 ENCOUNTER — Encounter (INDEPENDENT_AMBULATORY_CARE_PROVIDER_SITE_OTHER): Payer: Self-pay | Admitting: Family Medicine

## 2020-06-14 ENCOUNTER — Encounter (INDEPENDENT_AMBULATORY_CARE_PROVIDER_SITE_OTHER): Payer: Self-pay | Admitting: Family Medicine

## 2020-06-14 ENCOUNTER — Ambulatory Visit (INDEPENDENT_AMBULATORY_CARE_PROVIDER_SITE_OTHER): Payer: Medicare HMO | Admitting: Family Medicine

## 2020-06-15 ENCOUNTER — Other Ambulatory Visit: Payer: Self-pay

## 2020-06-15 ENCOUNTER — Ambulatory Visit (INDEPENDENT_AMBULATORY_CARE_PROVIDER_SITE_OTHER): Payer: Medicare HMO | Admitting: *Deleted

## 2020-06-15 DIAGNOSIS — Z23 Encounter for immunization: Secondary | ICD-10-CM | POA: Diagnosis not present

## 2020-07-06 ENCOUNTER — Ambulatory Visit (INDEPENDENT_AMBULATORY_CARE_PROVIDER_SITE_OTHER): Payer: Medicare HMO | Admitting: Family Medicine

## 2020-07-06 ENCOUNTER — Other Ambulatory Visit: Payer: Self-pay

## 2020-07-06 ENCOUNTER — Encounter (INDEPENDENT_AMBULATORY_CARE_PROVIDER_SITE_OTHER): Payer: Self-pay | Admitting: Family Medicine

## 2020-07-06 VITALS — BP 106/66 | HR 90 | Temp 98.0°F | Ht 69.0 in | Wt 206.0 lb

## 2020-07-06 DIAGNOSIS — N189 Chronic kidney disease, unspecified: Secondary | ICD-10-CM | POA: Diagnosis not present

## 2020-07-06 DIAGNOSIS — E669 Obesity, unspecified: Secondary | ICD-10-CM | POA: Diagnosis not present

## 2020-07-06 DIAGNOSIS — E7849 Other hyperlipidemia: Secondary | ICD-10-CM | POA: Diagnosis not present

## 2020-07-06 DIAGNOSIS — Z6830 Body mass index (BMI) 30.0-30.9, adult: Secondary | ICD-10-CM

## 2020-07-06 NOTE — Progress Notes (Signed)
Chief Complaint:   OBESITY [name] [name] is here to discuss his progress with his obesity treatment plan along with follow-up of his obesity related diagnoses. [name] [name] is on keeping a food journal and adhering to recommended goals of 1700-2000 calories and 120+ grams of protein daily and states he is following his eating plan approximately 50% of the time. [name] [name] states he is doing 0 minutes 0 times per week.  Today's visit was #: 24 Starting weight: 230 lbs Starting date: 06/18/2017 Today's weight: 206 lbs Today's date: 07/06/2020 Total lbs lost to date: 24 Total lbs lost since last in-office visit: 0  Interim History: [name] [name] has struggled with his weight more in the last month. He knows he is all or nothing, and can get obsessive about his nutrition and exercise, and then falls off track completely. He notes his labs have worsened and he is ready to get back on track.  Subjective:   1. Chronic renal impairment, unspecified CKD stage [name] [name]'s BUN and creatinine continues to worsen and increase. He has eaten higher sodium foods and is losing a lot of water with exercise in the past. His blood pressure is well controlled. I discussed labs with the patient today.  2. Other hyperlipidemia [name] [name]'s LDL is worsening, and is above goal. He has eaten more higher fat foods and gained weight in the last 2 months. He is on pravastatin 40 mg, but he is trying to get back to healthy eating. I discussed labs with the patient today.  Assessment/Plan:   1. Chronic renal impairment, unspecified CKD stage [name] [name] is ro increase his water intake to at least 100 oz per day, and decrease sodium. We will plan to recheck labs in 3-4 weeks.  2. Other hyperlipidemia Cardiovascular risk and specific lipid/LDL goals reviewed. We discussed several lifestyle modifications today. [name] [name] will continue his medications, and will continue to work on diet, exercise and weight loss efforts. Orders and follow up as documented in  patient record.   3. Class 1 obesity with serious comorbidity and body mass index (BMI) of 30.0 to 30.9 in adult, unspecified obesity type [name] [name] is currently in the action stage of change. As such, his goal is to continue with weight loss efforts. He has agreed to keeping a food journal and adhering to recommended goals of 1500-1800 calories and 100+ grams of protein daily, with sugar below 100.   Exercise goals: [name] [name] is to do cardio and strengthening for 30-60 minutes daily.  Behavioral modification strategies: increasing lean protein intake.  [name] [name] has agreed to follow-up with our clinic in 3 to 4 weeks. He was informed of the importance of frequent follow-up visits to maximize his success with intensive lifestyle modifications for his multiple health conditions.   Objective:   Blood pressure 106/66, pulse 90, temperature 98 F (36.7 C), height 5\' 9"  (1.753 m), weight 206 lb (93.4 kg), SpO2 97 %. Body mass index is 30.42 kg/m.  General: Cooperative, alert, well developed, in no acute distress. HEENT: Conjunctivae and lids unremarkable. Cardiovascular: Regular rhythm.  Lungs: Normal work of breathing. Neurologic: No focal deficits.   Lab Results  Component Value Date   CREATININE 1.38 (H) 06/09/2020   BUN 37 (H) 06/09/2020   NA 137 06/09/2020   K 5.0 06/09/2020   CL 99 06/09/2020   CO2 31 06/09/2020   Lab Results  Component Value Date   ALT 22 03/15/2020   AST 24 03/15/2020   ALKPHOS 51 03/15/2020   BILITOT 0.4 03/15/2020  Lab Results  Component Value Date   HGBA1C 5.3 03/15/2020   HGBA1C 5.7 (H) 06/19/2019   HGBA1C 5.5 11/21/2017   HGBA1C 5.8 (H) 06/18/2017   Lab Results  Component Value Date   INSULIN 5.0 03/15/2020   INSULIN 8.5 06/19/2019   INSULIN 8.6 11/21/2017   INSULIN 16.5 06/18/2017   Lab Results  Component Value Date   TSH 2.150 06/19/2019   Lab Results  Component Value Date   CHOL 250 (H) 06/09/2020   HDL 93 06/09/2020   LDLCALC 142 (H)  06/09/2020   LDLDIRECT 135.0 07/16/2013   TRIG 59 06/09/2020   CHOLHDL 2.7 06/09/2020   Lab Results  Component Value Date   WBC 5.5 06/18/2017   HGB 15.7 06/18/2017   HCT 45.0 06/18/2017   MCV 98 (H) 06/18/2017   PLT 313.0 07/31/2014   No results found for: IRON, TIBC, FERRITIN  Attestation Statements:   Reviewed by clinician on day of visit: allergies, medications, problem list, medical history, surgical history, family history, social history, and previous encounter notes.  Time spent on visit including pre-visit chart review and post-visit care and charting was 40 minutes.    I, Trixie Dredge, am acting as transcriptionist for Dennard Nip, MD.  I have reviewed the above documentation for accuracy and completeness, and I agree with the above. -  ,mec

## 2020-08-04 ENCOUNTER — Ambulatory Visit (INDEPENDENT_AMBULATORY_CARE_PROVIDER_SITE_OTHER): Payer: Medicare HMO | Admitting: Family Medicine

## 2020-08-08 ENCOUNTER — Other Ambulatory Visit: Payer: Self-pay | Admitting: Neurology

## 2020-08-17 ENCOUNTER — Ambulatory Visit (INDEPENDENT_AMBULATORY_CARE_PROVIDER_SITE_OTHER): Payer: Medicare HMO | Admitting: Family Medicine

## 2020-08-17 ENCOUNTER — Encounter (INDEPENDENT_AMBULATORY_CARE_PROVIDER_SITE_OTHER): Payer: Self-pay | Admitting: Family Medicine

## 2020-08-17 ENCOUNTER — Other Ambulatory Visit: Payer: Self-pay

## 2020-08-17 VITALS — BP 118/75 | Ht 69.0 in | Wt 206.0 lb

## 2020-08-17 DIAGNOSIS — N189 Chronic kidney disease, unspecified: Secondary | ICD-10-CM | POA: Diagnosis not present

## 2020-08-17 DIAGNOSIS — R7303 Prediabetes: Secondary | ICD-10-CM

## 2020-08-17 DIAGNOSIS — Z6830 Body mass index (BMI) 30.0-30.9, adult: Secondary | ICD-10-CM

## 2020-08-17 DIAGNOSIS — E669 Obesity, unspecified: Secondary | ICD-10-CM | POA: Diagnosis not present

## 2020-08-17 NOTE — Progress Notes (Signed)
Chief Complaint:   OBESITY [name] [name] is here to discuss his progress with his obesity treatment plan along with follow-up of his obesity related diagnoses. [name] [name] is on keeping a food journal and adhering to recommended goals of 1500-1800 calories and 100+ grams of protein daily and states he is following his eating plan approximately 25% of the time. [name] [name] states he is walking 5 miles per day and P90x (modified).   Today's visit was #: 25 Starting weight: 230 lbs Starting date: 06/18/2017 Today's weight: 206 lbs Today's date: 08/17/2020 Total lbs lost to date: 24 Total lbs lost since last in-office visit: 0  Interim History: [name] [name] did some celebration eating since his last visit. He has tried to increase his water intake and decrease exercise, but he is still exercising more than goal.  Subjective:   1. Chronic renal impairment, unspecified CKD stage [name] [name] has been working on increasing water to approximately 100 oz daily, and he notes his urine is lighter.   2. Pre-diabetes [name] [name] is working on diet and weight loss, and trying to decrease his sugar intake. He is frustrated with his weight loss results.  Assessment/Plan:   1. Chronic renal impairment, unspecified CKD stage We will check labs, and will follow up. [name] [name] is to continue to increase his water intake to 100 oz daily. - Comprehensive metabolic panel  2. Pre-diabetes [name] [name] will continue to work on weight loss, exercise, and decreasing simple carbohydrates to help decrease the risk of diabetes. [name] [name] was encouraged to continue to decrease sugar and will continue to monitor.  3. Class 1 obesity with serious comorbidity and body mass index (BMI) of 30.0 to 30.9 in adult, unspecified obesity type [name] [name] is currently in the action stage of change. As such, his goal is to continue with weight loss efforts. He has [name] to keeping a food journal and adhering to recommended goals of 1500-1800 calories and 100+ grams of protein  daily.   Exercise goals: As is.  Behavioral modification strategies: meal planning and cooking strategies, holiday eating strategies  and celebration eating strategies.  [name] [name] has [name] to follow-up with our clinic in 4 to 6 weeks. He was informed of the importance of frequent follow-up visits to maximize his success with intensive lifestyle modifications for his multiple health conditions.   [name] [name] was informed we would discuss his lab results at his next visit unless there is a critical issue that needs to be addressed sooner. [name] [name] to keep his next visit at the [name] upon time to discuss these results.  Objective:   Blood pressure 118/75, height 5\' 9"  (1.753 m), weight 206 lb (93.4 kg). Body mass index is 30.42 kg/m.  General: Cooperative, alert, well developed, in no acute distress. HEENT: Conjunctivae and lids unremarkable. Cardiovascular: Regular rhythm.  Lungs: Normal work of breathing. Neurologic: No focal deficits.   Lab Results  Component Value Date   CREATININE 1.38 (H) 06/09/2020   BUN 37 (H) 06/09/2020   NA 137 06/09/2020   K 5.0 06/09/2020   CL 99 06/09/2020   CO2 31 06/09/2020   Lab Results  Component Value Date   ALT 22 03/15/2020   AST 24 03/15/2020   ALKPHOS 51 03/15/2020   BILITOT 0.4 03/15/2020   Lab Results  Component Value Date   HGBA1C 5.3 03/15/2020   HGBA1C 5.7 (H) 06/19/2019   HGBA1C 5.5 11/21/2017   HGBA1C 5.8 (H) 06/18/2017   Lab Results  Component Value Date   INSULIN 5.0 03/15/2020  INSULIN 8.5 06/19/2019   INSULIN 8.6 11/21/2017   INSULIN 16.5 06/18/2017   Lab Results  Component Value Date   TSH 2.150 06/19/2019   Lab Results  Component Value Date   CHOL 250 (H) 06/09/2020   HDL 93 06/09/2020   LDLCALC 142 (H) 06/09/2020   LDLDIRECT 135.0 07/16/2013   TRIG 59 06/09/2020   CHOLHDL 2.7 06/09/2020   Lab Results  Component Value Date   WBC 5.5 06/18/2017   HGB 15.7 06/18/2017   HCT 45.0 06/18/2017   MCV 98 (H)  06/18/2017   PLT 313.0 07/31/2014   No results found for: IRON, TIBC, FERRITIN  Obesity Behavioral Intervention:   Approximately 15 minutes were spent on the discussion below.  ASK: We discussed the diagnosis of obesity with [name] [name] today and [name] [name] [name] to give [name] permission to discuss obesity behavioral modification therapy today.  ASSESS: [name] [name] has the diagnosis of obesity and his BMI today is 30.41. [name] [name] is in the action stage of change.   ADVISE: [name] [name] was educated on the multiple health risks of obesity as well as the benefit of weight loss to improve his health. He was advised of the need for long term treatment and the importance of lifestyle modifications to improve his current health and to decrease his risk of future health problems.  AGREE: Multiple dietary modification options and treatment options were discussed and [name] [name] to follow the recommendations documented in the above note.  ARRANGE: [name] [name] was educated on the importance of frequent visits to treat obesity as outlined per CMS and USPSTF guidelines and [name] to schedule his next follow up appointment today.  Attestation Statements:   Reviewed by clinician on day of visit: allergies, medications, problem list, medical history, surgical history, family history, social history, and previous encounter notes.   I, [name] [name], am acting as transcriptionist for Dennard Nip, MD.  I have reviewed the above documentation for accuracy and completeness, and I agree with the above. -  Dennard Nip, MD

## 2020-08-18 ENCOUNTER — Encounter (INDEPENDENT_AMBULATORY_CARE_PROVIDER_SITE_OTHER): Payer: Self-pay | Admitting: Family Medicine

## 2020-08-18 LAB — COMPREHENSIVE METABOLIC PANEL
ALT: 21 IU/L (ref 0–44)
AST: 23 IU/L (ref 0–40)
Albumin/Globulin Ratio: 1.7 (ref 1.2–2.2)
Albumin: 4.6 g/dL (ref 3.8–4.8)
Alkaline Phosphatase: 51 IU/L (ref 44–121)
BUN/Creatinine Ratio: 23 (ref 10–24)
BUN: 25 mg/dL (ref 8–27)
Bilirubin Total: 0.3 mg/dL (ref 0.0–1.2)
CO2: 26 mmol/L (ref 20–29)
Calcium: 9.5 mg/dL (ref 8.6–10.2)
Chloride: 101 mmol/L (ref 96–106)
Creatinine, Ser: 1.11 mg/dL (ref 0.76–1.27)
GFR calc Af Amer: 78 mL/min/{1.73_m2} (ref >59)
GFR calc non Af Amer: 67 mL/min/{1.73_m2} (ref >59)
Globulin, Total: 2.7 g/dL (ref 1.5–4.5)
Glucose: 101 mg/dL — ABNORMAL HIGH (ref 65–99)
Potassium: 4.9 mmol/L (ref 3.5–5.2)
Sodium: 139 mmol/L (ref 134–144)
Total Protein: 7.3 g/dL (ref 6.0–8.5)

## 2020-08-31 DIAGNOSIS — D485 Neoplasm of uncertain behavior of skin: Secondary | ICD-10-CM | POA: Diagnosis not present

## 2020-08-31 DIAGNOSIS — L578 Other skin changes due to chronic exposure to nonionizing radiation: Secondary | ICD-10-CM | POA: Diagnosis not present

## 2020-08-31 DIAGNOSIS — D225 Melanocytic nevi of trunk: Secondary | ICD-10-CM | POA: Diagnosis not present

## 2020-08-31 DIAGNOSIS — D2372 Other benign neoplasm of skin of left lower limb, including hip: Secondary | ICD-10-CM | POA: Diagnosis not present

## 2020-08-31 DIAGNOSIS — Z85828 Personal history of other malignant neoplasm of skin: Secondary | ICD-10-CM | POA: Diagnosis not present

## 2020-08-31 DIAGNOSIS — L821 Other seborrheic keratosis: Secondary | ICD-10-CM | POA: Diagnosis not present

## 2020-08-31 DIAGNOSIS — D2261 Melanocytic nevi of right upper limb, including shoulder: Secondary | ICD-10-CM | POA: Diagnosis not present

## 2020-09-01 DIAGNOSIS — G4733 Obstructive sleep apnea (adult) (pediatric): Secondary | ICD-10-CM | POA: Diagnosis not present

## 2020-09-29 ENCOUNTER — Ambulatory Visit (INDEPENDENT_AMBULATORY_CARE_PROVIDER_SITE_OTHER): Payer: Medicare HMO | Admitting: Family Medicine

## 2020-10-06 NOTE — Telephone Encounter (Signed)
Ajovy renewal PA completed on Cover My Meds. Key: [id number]. Awaiting determination from [facility]. Also completed the prescription request form for Ajovy. It was signed by DrMarland Kitchen Jaynee Eagles. Pending approval from [facility], I will fax both together to [employer] @ [phone].

## 2020-10-06 NOTE — Telephone Encounter (Signed)
PA Case: [id number], Status: Approved, Coverage Starts on: 09/25/2020 12:00:00 AM, Coverage Ends on: 09/24/2021 12:00:00 AM. Questions? Contact [phone].  I called [employer] and spoke with Stacy. I requested to have the approval letter faxed to our office. Letter received and was faxed to [employer] along with the prescription request form. Received a receipt of confirmation.

## 2020-10-07 ENCOUNTER — Telehealth: Payer: Self-pay | Admitting: Adult Health

## 2020-10-07 NOTE — Telephone Encounter (Signed)
Pt. is requesting a call from RN as soon as available.

## 2020-10-07 NOTE — Telephone Encounter (Signed)
I tried to call the pt back but had to LVM. Left office number for call back and let him know mychart messages and calls will be returned Monday weather pending.

## 2020-10-22 ENCOUNTER — Other Ambulatory Visit: Payer: Self-pay | Admitting: Neurology

## 2020-10-22 DIAGNOSIS — G43711 Chronic migraine without aura, intractable, with status migrainosus: Secondary | ICD-10-CM

## 2020-11-03 ENCOUNTER — Ambulatory Visit (INDEPENDENT_AMBULATORY_CARE_PROVIDER_SITE_OTHER): Payer: Medicare HMO | Admitting: Family Medicine

## 2020-11-15 ENCOUNTER — Other Ambulatory Visit: Payer: Self-pay | Admitting: Neurology

## 2020-11-30 DIAGNOSIS — G4733 Obstructive sleep apnea (adult) (pediatric): Secondary | ICD-10-CM | POA: Diagnosis not present

## 2020-12-04 ENCOUNTER — Other Ambulatory Visit: Payer: Self-pay | Admitting: Neurology

## 2020-12-09 IMAGING — DX CHEST - 2 VIEW
2 series · 2 of 2 positions shown · non-contrast
Comparison: None.

CLINICAL DATA: Cough.

EXAM:
CHEST - 2 VIEW

[chest pa]
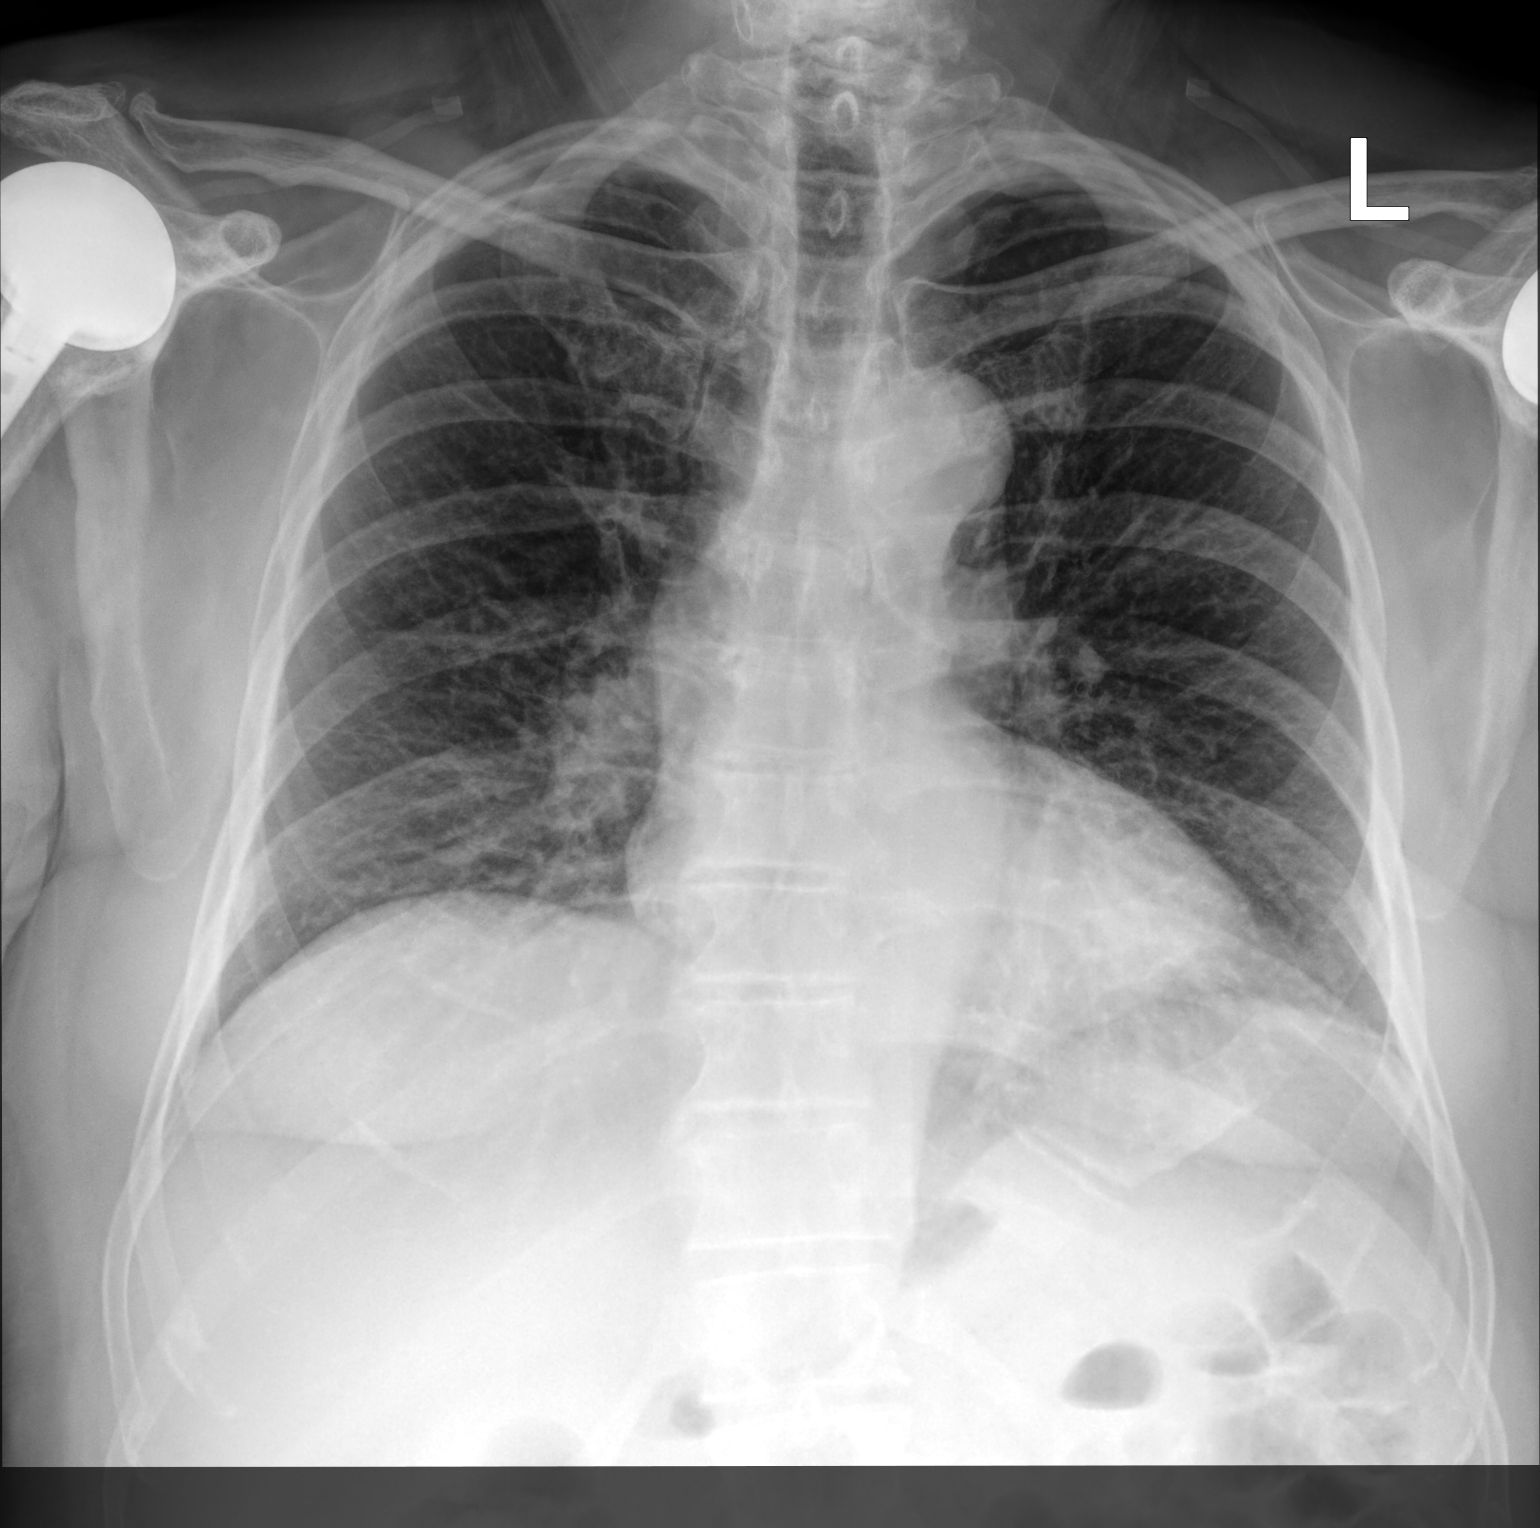

[chest lat]
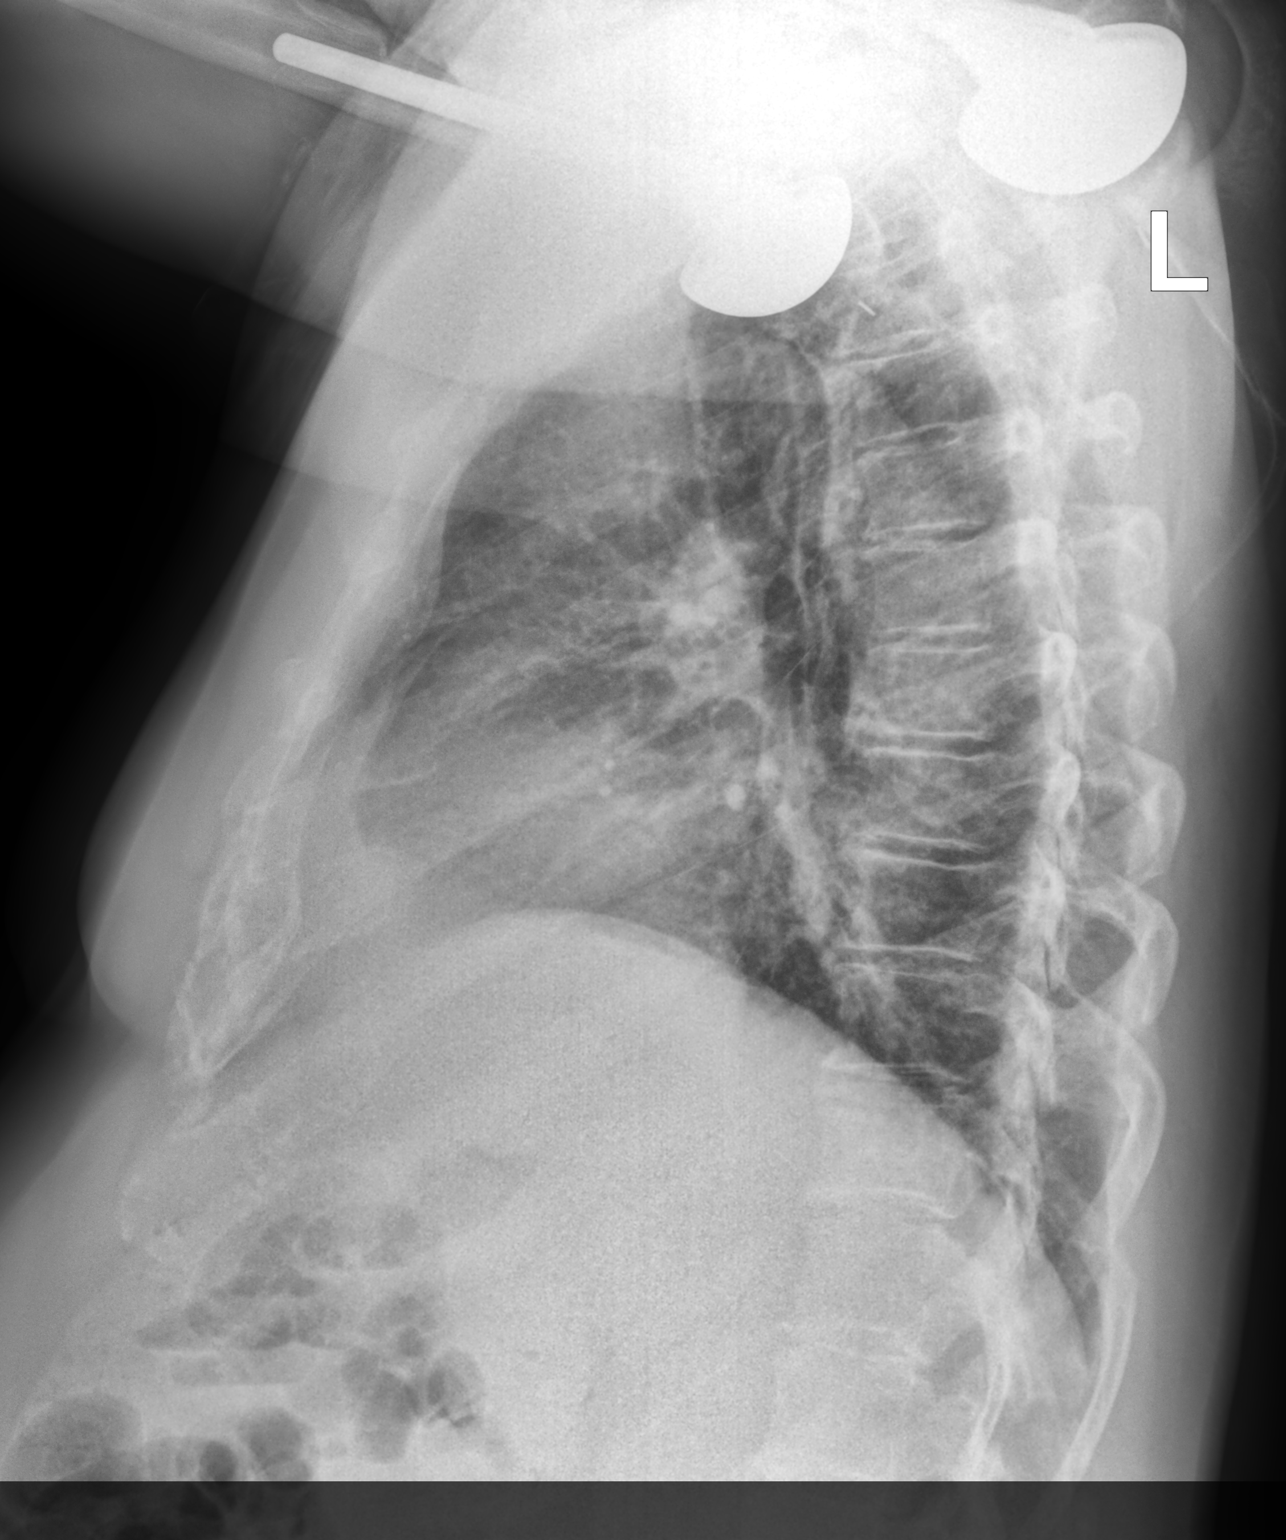

[2 of 2 positions shown; findings below may reference images not displayed]

FINDINGS: The heart size and mediastinal contours are within normal limits.
Both lungs are clear. Status post bilateral shoulder arthroplasties.
IMPRESSION: No active cardiopulmonary disease.

## 2021-01-03 ENCOUNTER — Ambulatory Visit: Payer: Medicare HMO | Admitting: Adult Health

## 2021-01-04 ENCOUNTER — Encounter (INDEPENDENT_AMBULATORY_CARE_PROVIDER_SITE_OTHER): Payer: Self-pay | Admitting: Family Medicine

## 2021-01-04 ENCOUNTER — Ambulatory Visit: Payer: Medicare HMO | Admitting: Adult Health

## 2021-01-05 NOTE — Telephone Encounter (Signed)
Dr.Beasley 

## 2021-01-12 ENCOUNTER — Other Ambulatory Visit: Payer: Self-pay | Admitting: Family Medicine

## 2021-01-14 ENCOUNTER — Other Ambulatory Visit: Payer: Self-pay | Admitting: Family Medicine

## 2021-01-17 ENCOUNTER — Ambulatory Visit (INDEPENDENT_AMBULATORY_CARE_PROVIDER_SITE_OTHER): Payer: Medicare HMO | Admitting: Family Medicine

## 2021-01-19 ENCOUNTER — Other Ambulatory Visit: Payer: Self-pay | Admitting: Neurology

## 2021-01-26 ENCOUNTER — Other Ambulatory Visit: Payer: Self-pay | Admitting: Neurology

## 2021-01-26 DIAGNOSIS — G43109 Migraine with aura, not intractable, without status migrainosus: Secondary | ICD-10-CM

## 2021-01-28 ENCOUNTER — Other Ambulatory Visit: Payer: Self-pay | Admitting: Neurology

## 2021-01-31 ENCOUNTER — Other Ambulatory Visit: Payer: Self-pay | Admitting: Neurology

## 2021-01-31 DIAGNOSIS — G43109 Migraine with aura, not intractable, without status migrainosus: Secondary | ICD-10-CM

## 2021-02-01 ENCOUNTER — Telehealth: Payer: Self-pay | Admitting: Adult Health

## 2021-02-01 DIAGNOSIS — G43109 Migraine with aura, not intractable, without status migrainosus: Secondary | ICD-10-CM

## 2021-02-01 MED ORDER — GABAPENTIN 300 MG PO CAPS: 300.0000 mg | ORAL_CAPSULE | Freq: Two times a day (BID) | ORAL | 0 refills | Status: DC

## 2021-02-01 MED ORDER — AMITRIPTYLINE HCL 100 MG PO TABS: 100.0000 mg | ORAL_TABLET | Freq: Every day | ORAL | 0 refills | Status: DC

## 2021-02-01 NOTE — Telephone Encounter (Signed)
Spoke with patient.  He reports he feels stable regarding his sleep apnea and migraines.  He only has 5 migraines a month which is much less than his baseline.  He states he wished he would have listened to Dr. Jaynee Eagles previously about the CPAP.  Patient scheduled an appointment for migraines and CPAP follow-up with Jinny Blossom NP as a video visit on Thursday, July 21 at 8:30 AM.  I let the patient know I will send a refill to his pharmacy.  His questions were answered and he verbalized appreciation.

## 2021-02-01 NOTE — Telephone Encounter (Signed)
I called pt and he stated that needs refills on gabapentin and amitriptyline. Last seen 12-2019, I relayed that need appt in office or VV for med refills.  He states that he communicates with Romelle Starcher RN and Dr Jaynee Eagles or used to.  He does not get have option for communicate with him.  I reiterated that med refills require annual visit.  He asked that [facility] call him.

## 2021-03-01 DIAGNOSIS — G4733 Obstructive sleep apnea (adult) (pediatric): Secondary | ICD-10-CM | POA: Diagnosis not present

## 2021-04-08 ENCOUNTER — Encounter (HOSPITAL_BASED_OUTPATIENT_CLINIC_OR_DEPARTMENT_OTHER): Payer: Self-pay | Admitting: Emergency Medicine

## 2021-04-08 ENCOUNTER — Emergency Department (HOSPITAL_BASED_OUTPATIENT_CLINIC_OR_DEPARTMENT_OTHER)
Admission: EM | Admit: 2021-04-08 | Discharge: 2021-04-09 | Disposition: A | Discharge status: Home / Self Care | Payer: Medicare HMO | Attending: Emergency Medicine | Admitting: Emergency Medicine

## 2021-04-08 ENCOUNTER — Other Ambulatory Visit: Payer: Self-pay

## 2021-04-08 ENCOUNTER — Emergency Department (HOSPITAL_BASED_OUTPATIENT_CLINIC_OR_DEPARTMENT_OTHER): Payer: Medicare HMO

## 2021-04-08 DIAGNOSIS — Z96611 Presence of right artificial shoulder joint: Secondary | ICD-10-CM | POA: Diagnosis not present

## 2021-04-08 DIAGNOSIS — Z87442 Personal history of urinary calculi: Secondary | ICD-10-CM | POA: Diagnosis not present

## 2021-04-08 DIAGNOSIS — N132 Hydronephrosis with renal and ureteral calculous obstruction: Secondary | ICD-10-CM | POA: Diagnosis not present

## 2021-04-08 DIAGNOSIS — Z85828 Personal history of other malignant neoplasm of skin: Secondary | ICD-10-CM | POA: Diagnosis not present

## 2021-04-08 DIAGNOSIS — Z96612 Presence of left artificial shoulder joint: Secondary | ICD-10-CM | POA: Diagnosis not present

## 2021-04-08 DIAGNOSIS — Z79899 Other long term (current) drug therapy: Secondary | ICD-10-CM | POA: Insufficient documentation

## 2021-04-08 DIAGNOSIS — R1110 Vomiting, unspecified: Secondary | ICD-10-CM | POA: Diagnosis not present

## 2021-04-08 DIAGNOSIS — N201 Calculus of ureter: Secondary | ICD-10-CM | POA: Diagnosis not present

## 2021-04-08 DIAGNOSIS — N200 Calculus of kidney: Secondary | ICD-10-CM

## 2021-04-08 DIAGNOSIS — Z96651 Presence of right artificial knee joint: Secondary | ICD-10-CM | POA: Diagnosis not present

## 2021-04-08 DIAGNOSIS — R109 Unspecified abdominal pain: Secondary | ICD-10-CM | POA: Diagnosis present

## 2021-04-08 LAB — URINALYSIS, ROUTINE W REFLEX MICROSCOPIC
Bilirubin Urine: NEGATIVE
Glucose, UA: NEGATIVE mg/dL
Ketones, ur: NEGATIVE mg/dL
Leukocytes,Ua: NEGATIVE
Nitrite: NEGATIVE
Protein, ur: NEGATIVE mg/dL
Specific Gravity, Urine: 1.016 (ref 1.005–1.030)
pH: 5 (ref 5.0–8.0)

## 2021-04-08 LAB — CBC
HCT: 46.1 % (ref 39.0–52.0)
Hemoglobin: 15.8 g/dL (ref 13.0–17.0)
MCH: 32.6 pg (ref 26.0–34.0)
MCHC: 34.3 g/dL (ref 30.0–36.0)
MCV: 95.1 fL (ref 80.0–100.0)
Platelets: 264 10*3/uL (ref 150–400)
RBC: 4.85 MIL/uL (ref 4.22–5.81)
RDW: 12.8 % (ref 11.5–15.5)
WBC: 10 10*3/uL (ref 4.0–10.5)
nRBC: 0 % (ref 0.0–0.2)

## 2021-04-08 LAB — BASIC METABOLIC PANEL
Anion gap: 11 (ref 5–15)
BUN: 25 mg/dL — ABNORMAL HIGH (ref 8–23)
CO2: 25 mmol/L (ref 22–32)
Calcium: 9.3 mg/dL (ref 8.9–10.3)
Chloride: 102 mmol/L (ref 98–111)
Creatinine, Ser: 1.32 mg/dL — ABNORMAL HIGH (ref 0.61–1.24)
GFR, Estimated: 58 mL/min — ABNORMAL LOW (ref >60)
Glucose, Bld: 98 mg/dL (ref 70–99)
Potassium: 3.8 mmol/L (ref 3.5–5.1)
Sodium: 138 mmol/L (ref 135–145)

## 2021-04-08 MED ORDER — ONDANSETRON HCL 4 MG/2ML IJ SOLN
4.0000 mg | Freq: Once | INTRAMUSCULAR | Status: AC
  Administered 2021-04-08: 4 mg via INTRAVENOUS
  Filled 2021-04-08: qty 2

## 2021-04-08 MED ORDER — LACTATED RINGERS IV BOLUS
500.0000 mL | Freq: Once | INTRAVENOUS | Status: AC
  Administered 2021-04-08: 500 mL via INTRAVENOUS

## 2021-04-08 MED ORDER — HYDROMORPHONE HCL 1 MG/ML IJ SOLN
0.5000 mg | Freq: Once | INTRAMUSCULAR | Status: AC
  Administered 2021-04-08: 0.5 mg via INTRAVENOUS
  Filled 2021-04-08: qty 1

## 2021-04-08 MED ORDER — ONDANSETRON HCL 4 MG/2ML IJ SOLN
4.0000 mg | Freq: Once | INTRAMUSCULAR | Status: DC
  Filled 2021-04-08: qty 2

## 2021-04-08 MED ORDER — TAMSULOSIN HCL 0.4 MG PO CAPS
0.4000 mg | ORAL_CAPSULE | Freq: Once | ORAL | Status: AC
  Administered 2021-04-08: 0.4 mg via ORAL
  Filled 2021-04-08: qty 1

## 2021-04-08 MED ORDER — KETOROLAC TROMETHAMINE 30 MG/ML IJ SOLN
30.0000 mg | Freq: Once | INTRAMUSCULAR | Status: AC
  Administered 2021-04-08: 30 mg via INTRAVENOUS
  Filled 2021-04-08: qty 1

## 2021-04-08 NOTE — ED Notes (Signed)
Patient transported to CT 

## 2021-04-08 NOTE — ED Provider Notes (Signed)
[facility]ERGENCY DEPT Provider Note   CSN: [id number] Arrival date & time: 04/08/21  1926     History Chief Complaint  Patient presents with   Flank Pain    [name] [name] is a 70 y.o. male.  HPI Patient has a history of kidney stones.  This was a distant episode about 10 years ago.  He reports today he started getting flank pain on the left that reminded him of prior kidney stone.  He reports that increased and became very severe.  He reports that it felt like of intense fullness in his back.  There was no comfortable position.  He ultimately became quite nauseated and started having dry heaves.  He has not been having any fever chills pain or burning with urination    Past Medical History:  Diagnosis Date   ALLERGIC RHINITIS 04/22/2007   Allergy    Cancer ([place])    HX SKIN CANCER   Complication of anesthesia    more concerned about positioning of head & neck because of TMJ   DDD (degenerative disc disease), cervical    GERD (gastroesophageal reflux disease)    infrequently   Headache(784.0)    migraines   History of kidney stones    History of skin cancer    History of TMJ disorder    HYPERCHOLESTEROLEMIA 04/22/2007   HYPERLIPIDEMIA 07/29/2007   Hyperplastic colon polyp    NEPHROLITHIASIS, HX OF 07/29/2007   Osteoarth NOS-Unspec 04/22/2007   Shortness of breath    Shoulder pain, acute    bilateral    Patient Active Problem List   Diagnosis Date Noted   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   History of replacement of both shoulder joints 05/06/2020   History of spinal fusion 05/06/2020   History of stress fracture 05/06/2020   Chronic migraine without aura, with intractable migraine, so stated, with status migrainosus 12/02/2018   OSA (obstructive sleep apnea) 12/02/2018   Tinnitus 03/20/2018   Prediabetes 11/21/2017   Other fatigue 06/18/2017   Shortness of breath on exertion 06/18/2017   Other hyperlipidemia 06/18/2017    Vitamin D deficiency 06/18/2017   Hyperglycemia 06/18/2017   Class 1 obesity with serious comorbidity and body mass index (BMI) of 34.0 to 34.9 in adult 06/18/2017   Right lumbar radiculopathy 08/24/2014   Piriformis syndrome of right side 08/24/2014   S/P shoulder replacement 12/25/2013   Migraine headache 12/22/2013   OA (osteoarthritis) of knee 08/25/2013   HYPERLIPIDEMIA 07/29/2007   NEPHROLITHIASIS, HX OF 07/29/2007   HYPERCHOLESTEROLEMIA 04/22/2007   Allergic rhinitis 04/22/2007   Osteoarthritis 04/22/2007    Past Surgical History:  Procedure Laterality Date   APPENDECTOMY     BACK SURGERY  10/07/2015   COLONOSCOPY     10 yrs ago- 2 HPP    COLONOSCOPY  2019   JOINT REPLACEMENT     KNEE ARTHROSCOPY     right x2   SHOULDER SURGERY     right   skin cancer biopsy     L forehead   TOTAL KNEE ARTHROPLASTY Right 08/25/2013   Procedure: RIGHT TOTAL KNEE ARTHROPLASTY;  Surgeon: Gearlean Alf, MD;  Location: [facility];  Service: Orthopedics;  Laterality: Right;   TOTAL SHOULDER ARTHROPLASTY Left 12/25/2013   DR SUPPLE    TOTAL SHOULDER ARTHROPLASTY Left 12/25/2013   Procedure: LEFT TOTAL SHOULDER ARTHROPLASTY;  Surgeon: Marin Shutter, MD;  Location: [facility];  Service: Orthopedics;  Laterality: Left;   TOTAL SHOULDER ARTHROPLASTY Right  06/18/2014   DR SUPPLE   TOTAL SHOULDER ARTHROPLASTY Right 06/18/2014   Procedure: RIGHT TOTAL SHOULDER ARTHROPLASTY;  Surgeon: Marin Shutter, MD;  Location: [facility];  Service: Orthopedics;  Laterality: Right;   WISDOM TOOTH EXTRACTION         Family History  Problem Relation Age of Onset   COPD Father        family hx   Emphysema Father    Aneurysm Mother    Stroke Mother    Sudden death Mother    Cancer Mother    Obesity Mother    Breast cancer Mother    Cancer Maternal Grandfather        lung   Colon cancer Neg Hx    Colon polyps Neg Hx     Social History   Tobacco Use   Smoking status: Never   Smokeless tobacco: Never  Vaping  Use   Vaping Use: Never used  Substance Use Topics   Alcohol use: No    Alcohol/week: 0.0 standard drinks   Drug use: No    Home Medications Prior to Admission medications   Medication Sig Start Date End Date Taking? Authorizing Provider  ondansetron (ZOFRAN ODT) 4 MG disintegrating tablet Take 1 tablet (4 mg total) by mouth every 4 (four) hours as needed for nausea or vomiting. 04/09/21  Yes Beatrice Ziehm, Jeannie Done, MD  oxyCODONE-acetaminophen (PERCOCET) 5-325 MG tablet Take 1-2 tablets by mouth every 6 (six) hours as needed. 04/09/21  Yes Charlesetta Shanks, MD  tamsulosin (FLOMAX) 0.4 MG CAPS capsule Take 1 capsule (0.4 mg total) by mouth daily. 04/09/21  Yes Charlesetta Shanks, MD  allopurinol (ZYLOPRIM) 100 MG tablet TAKE 1 TABLET EVERY DAY 01/17/21   [name], MD  amitriptyline (ELAVIL) 100 MG tablet Take 1 tablet (100 mg total) by mouth at bedtime. 02/01/21   Melvenia Beam, MD  aspirin 81 MG tablet Take 81 mg by mouth daily.    [provider]  Cholecalciferol (VITAMIN D PO) Take 1 tablet by mouth daily.    [provider]  Coenzyme Q10 (CO Q 10 PO) Take by mouth daily.    [provider]  Cyanocobalamin (VITAMIN B 12 PO) Take by mouth.    [provider]  DULoxetine (CYMBALTA) 60 MG capsule TAKE 1 CAPSULE EVERY DAY 10/25/20   Sarina Ill B, MD  EPINEPHrine 0.3 mg/0.3 mL IJ SOAJ injection INJECT 0.3 MLS INTO THE MUSCLE ONCE 01/16/19   [name], MD  Fremanezumab-vfrm (AJOVY) 225 MG/1.5ML SOAJ Inject 225 mg into the skin every 30 (thirty) days. 01/01/20   Melvenia Beam, MD  gabapentin (NEURONTIN) 300 MG capsule Take 1 capsule (300 mg total) by mouth 2 (two) times daily. 02/01/21   Melvenia Beam, MD  Multiple Vitamin (MULTIVITAMIN) tablet Take 1 tablet by mouth daily.    [provider]  naproxen (NAPROSYN) 500 MG tablet Take 500 mg by mouth 2 (two) times daily with a meal.    [provider]  Omega-3 Fatty Acids (FISH OIL) 1000  MG CAPS Take 1 capsule by mouth daily.    [provider]  pravastatin (PRAVACHOL) 40 MG tablet TAKE 1 TABLET (40 MG TOTAL) BY MOUTH DAILY. 01/12/21   [name], MD  Psyllium (METAMUCIL PO) Take 2 tablets by mouth 2 (two) times daily.    [provider]  Red Yeast Rice Extract (RED YEAST RICE PO) Take 1 tablet by mouth daily.    [provider]  SUMAtriptan (IMITREX) 100 MG tablet TAKE 1 TABLET AS NEEDED FOR MIGRAINE. MAY REPEAT IN 2 HOURS IF HEADACHE PERSISTS OR RECURS. MAX 2 TABLETS IN 24 HOURS 01/31/21   Melvenia Beam, MD  tiZANidine (ZANAFLEX) 4 MG tablet TAKE 1 TABLET THREE TIMES DAILY AS NEEDED FOR MUSCLE SPASM(S) 01/19/21   Melvenia Beam, MD  TURMERIC CURCUMIN PO Take 1,050 mg by mouth daily.    [provider]  valACYclovir (VALTREX) 500 MG tablet Take 500 mg by mouth daily.    [provider]  vitamin C (ASCORBIC ACID) 500 MG tablet Take 500 mg by mouth daily.    [provider]  vitamin E 1000 UNIT capsule Take 1,000 Units by mouth daily.    [provider]    Allergies    Shrimp [shellfish allergy], Codeine, Contrast media [iodinated diagnostic agents], Diazepam, Amoxicillin, Benadryl [diphenhydramine], Depakote [divalproex sodium], Erythromycin, Flexeril [cyclobenzaprine], Allegra [fexofenadine], Betadine [povidone iodine], Celebrex [celecoxib], Meperidine and related, Methocarbamol, Neosporin [neomycin-bacitracin zn-polymyx], Oxycodone, Penicillins, Plavix [clopidogrel], Rivaroxaban, and Vioxx [rofecoxib]  Review of Systems   Review of Systems 10 systems reviewed and negative except as per HPI Physical Exam Updated Vital Signs BP 126/70 (BP Location: Right Arm)   Pulse 71   Temp 98.3 F (36.8 C) (Oral)   Resp 18   Ht 5\' 9"  (1.753 m)   Wt 93.4 kg   SpO2 94%   BMI 30.41 kg/m   Physical Exam Constitutional:      Comments: Alert nontoxic.  Mental status clear.  No respiratory distress.  Appears to be in  moderate pain.  HENT:     Mouth/Throat:     Pharynx: Oropharynx is clear.  Cardiovascular:     Rate and Rhythm: Normal rate and regular rhythm.  Pulmonary:     Effort: Pulmonary effort is normal.     Breath sounds: Normal breath sounds.  Abdominal:     General: There is no distension.     Palpations: Abdomen is soft.     Tenderness: There is no abdominal tenderness. There is no guarding.  Musculoskeletal:        General: Normal range of motion.  Skin:    General: Skin is warm and dry.  Neurological:     General: No focal deficit present.     Mental Status: He is oriented to person, place, and time.     Coordination: Coordination normal.    ED Results / Procedures / Treatments   Labs (all labs ordered are listed, but only abnormal results are displayed) Labs Reviewed  URINALYSIS, ROUTINE W REFLEX MICROSCOPIC - Abnormal; Notable for the following components:      Result Value   Hgb urine dipstick SMALL (*)    All other components within normal limits  BASIC METABOLIC PANEL - Abnormal; Notable for the following components:   BUN 25 (*)    Creatinine, Ser 1.32 (*)    GFR, Estimated 58 (*)    All other components within normal limits  CBC    EKG None  Radiology CT Renal Stone Study  Result Date: 04/08/2021 CLINICAL DATA:  Flank pain and 4 episodes of emesis EXAM: CT ABDOMEN AND PELVIS WITHOUT CONTRAST TECHNIQUE: Multidetector CT imaging of the abdomen and pelvis was performed following the standard protocol without IV contrast. COMPARISON:  None. FINDINGS: Lower chest: 5 mm lingular nodule (image 1/5) Hepatobiliary: Several round lesions in liver have simple fluid attenuation typical of benign hepatic cysts. Liver does have heterogeneous density suggestive of regional  fatty infiltration. Gallbladder normal. Pancreas: Pancreas is normal. No ductal dilatation. No pancreatic inflammation. Spleen: Normal spleen Adrenals/urinary tract: Adrenal glands normal. Mild hydronephrosis  hydroureter on the LEFT. There is obstructing calculus in the distal LEFT ureter at the vascular junction. This calculus measures 5 mm on image 83/3. Nonobstructing calculus in the RIGHT kidney measures 5 mm. No RIGHT ureterolithiasis. Stomach/Bowel: Stomach, small bowel, appendix, and cecum are normal. The colon and rectosigmoid colon are normal. Vascular/Lymphatic: Abdominal aorta is normal caliber. No periportal or retroperitoneal adenopathy. No pelvic adenopathy. Reproductive: Prostate unremarkable Other: No free fluid. Musculoskeletal: No aggressive osseous lesion. IMPRESSION: 1. Mildly obstructing calculus in the distal LEFT ureter at the vesicoureteral junction. Mild hydronephrosis and hydroureter on the LEFT. 2. Nonobstructing RIGHT renal calculus. 3. Heterogeneous density within liver could indicate regional hepatic steatosis. Consider MRI of the liver for further evaluation. Several round benign-appearing cysts within liver could also be characterized at that time. Electronically Signed   By: Suzy Bouchard M.D.   On: 04/08/2021 21:23    Procedures Procedures   Medications Ordered in ED Medications  ondansetron [facility]) injection 4 mg (0 mg Intravenous Hold 04/08/21 2312)  ondansetron (ZOFRAN) injection 4 mg (4 mg Intravenous Given 04/08/21 2044)  HYDROmorphone (DILAUDID) injection 0.5 mg (0.5 mg Intravenous Given 04/08/21 2305)  lactated ringers bolus 500 mL (0 mLs Intravenous Stopped 04/09/21 0008)  ketorolac (TORADOL) 30 MG/ML injection 30 mg (30 mg Intravenous Given 04/08/21 2305)  tamsulosin (FLOMAX) capsule 0.4 mg (0.4 mg Oral Given 04/08/21 2316)  oxyCODONE-acetaminophen (PERCOCET/ROXICET) 5-325 MG per tablet 1 tablet (1 tablet Oral Given 04/09/21 0016)    ED Course  I have reviewed the triage vital signs and the nursing notes.  Pertinent labs & imaging results that were available during my care of the patient were reviewed by me and considered in my medical decision making (see chart  for details).    MDM Rules/Calculators/A&P                          Patient presents as outlined.  He had prior history of kidney stones.  CT identifies 5 mm stone with mild hydro.  Patient is pain controlled with Toradol Dilaudid and Zofran.  He feels much improved.  At this time plan will be for discharge with planned follow-up with urology.  Return precautions reviewed Final Clinical Impression(s) / ED Diagnoses Final diagnoses:  Kidney stone    Rx / DC Orders ED Discharge Orders          Ordered    oxyCODONE-acetaminophen (PERCOCET) 5-325 MG tablet  Every 6 hours PRN        04/09/21 0004    tamsulosin (FLOMAX) 0.4 MG CAPS capsule  Daily        04/09/21 0004    ondansetron (ZOFRAN ODT) 4 MG disintegrating tablet  Every 4 hours PRN        04/09/21 0004             Charlesetta Shanks, MD 04/09/21 0017

## 2021-04-08 NOTE — ED Triage Notes (Signed)
Pt via pov from home with presumed kidney stones. Pt states he had stones on the right side 10 years ago and that this feels similar, but on the left side. Denies hematuria or other urinary symptoms. Pt states he has vomited 4 or more times today. Pt alert & oriented, unable to sit still during triage.

## 2021-04-09 MED ORDER — TAMSULOSIN HCL 0.4 MG PO CAPS: 0.4000 mg | ORAL_CAPSULE | Freq: Every day | ORAL | 0 refills | Status: DC

## 2021-04-09 MED ORDER — OXYCODONE-ACETAMINOPHEN 5-325 MG PO TABS
1.0000 | ORAL_TABLET | Freq: Once | ORAL | Status: AC
Start: 2021-04-09 — End: 2021-04-09
  Administered 2021-04-09: 1 via ORAL
  Filled 2021-04-09: qty 1

## 2021-04-09 MED ORDER — ONDANSETRON 4 MG PO TBDP: 4.0000 mg | ORAL_TABLET | ORAL | 0 refills | Status: DC | PRN

## 2021-04-09 MED ORDER — OXYCODONE-ACETAMINOPHEN 5-325 MG PO TABS: 1.0000 | ORAL_TABLET | Freq: Four times a day (QID) | ORAL | 0 refills | Status: DC | PRN

## 2021-04-09 NOTE — Discharge Instructions (Addendum)
1.  Take naproxen twice a day for your baseline pain control.  Take Percocet 1 to 2 tablets every 6 hours for additional pain control.  Take Zofran 4 mg every 4 hours as needed for nausea.  Take Flomax once daily to help pass your kidney stone.  Strain your urine and collect your kidney stone if possible. 2.  Schedule follow-up appoint with [facility] as soon as possible. 3.  Return to the emergency department if you cannot control your pain with the medications as prescribed, you develop fever or worsening or changing pain or other concerning symptoms.

## 2021-04-13 ENCOUNTER — Other Ambulatory Visit: Payer: Self-pay | Admitting: Neurology

## 2021-04-13 DIAGNOSIS — G43109 Migraine with aura, not intractable, without status migrainosus: Secondary | ICD-10-CM

## 2021-04-14 ENCOUNTER — Telehealth: Payer: Medicare HMO | Admitting: Adult Health

## 2021-04-14 DIAGNOSIS — Z9989 Dependence on other enabling machines and devices: Secondary | ICD-10-CM | POA: Diagnosis not present

## 2021-04-14 DIAGNOSIS — G43711 Chronic migraine without aura, intractable, with status migrainosus: Secondary | ICD-10-CM

## 2021-04-14 DIAGNOSIS — G43109 Migraine with aura, not intractable, without status migrainosus: Secondary | ICD-10-CM | POA: Diagnosis not present

## 2021-04-14 DIAGNOSIS — G4733 Obstructive sleep apnea (adult) (pediatric): Secondary | ICD-10-CM

## 2021-04-14 MED ORDER — SUMATRIPTAN SUCCINATE 100 MG PO TABS: ORAL_TABLET | ORAL | 3 refills | Status: DC

## 2021-04-14 MED ORDER — DULOXETINE HCL 60 MG PO CPEP: 60.0000 mg | ORAL_CAPSULE | Freq: Every day | ORAL | 3 refills | Status: DC

## 2021-04-14 MED ORDER — AMITRIPTYLINE HCL 100 MG PO TABS: 100.0000 mg | ORAL_TABLET | Freq: Every day | ORAL | 3 refills | Status: DC

## 2021-04-14 MED ORDER — GABAPENTIN 300 MG PO CAPS: 300.0000 mg | ORAL_CAPSULE | Freq: Two times a day (BID) | ORAL | 3 refills | Status: DC

## 2021-04-14 MED ORDER — TIZANIDINE HCL 4 MG PO TABS: ORAL_TABLET | ORAL | 3 refills | Status: DC

## 2021-04-14 NOTE — Progress Notes (Addendum)
PATIENT: [name] [name] DOB: 11-04-1950  REASON FOR VISIT: follow up HISTORY FROM: patient  Virtual Visit via Video Note  I connected with [name] [name] on 04/14/21 at  8:30 AM EDT by a video enabled telemedicine application located remotely at [facility] and verified that I am speaking with the correct person using two identifiers who was located at their own home.   I discussed the limitations of evaluation and management by telemedicine and the availability of in person appointments. The patient expressed understanding and agreed to proceed.   PATIENT: [name] [name] DOB: 1951-04-26  REASON FOR VISIT: follow up HISTORY FROM: patient Primary neurologist: Dr. Jaynee Eagles Sleep neurologist: Dr. Rexene Alberts  HISTORY OF PRESENT ILLNESS: Today 04/14/21 :  Mr. Whyte is a 70 year old male with a history of obstructive sleep apnea on CPAP.  He reports that the CPAP continues to work well for him.  He states on occasion there may be a night that he falls asleep without putting it on.  There is also some nights that he may take it off and go back to sleep.  He states that his migraines were cut in half once he started using the CPAP.  He reports his migraines have been under relatively good control.  He remains on amitriptyline, gabapentin, tizanidine, Cymbalta and Ajovy for prevention.  He continues to use sumatriptan hand for abortive therapy.    01/01/20: Mr. Schreifels is a 69 year old male with a history of obstructive sleep apnea on CPAP.  He returns today for follow-up.  His download indicates that he uses his machine nightly for compliance of 100%.  He uses machine greater than 4 hours 25 days for compliance of 83%.  On average he uses his machine 5 hours and 54 minutes.  His residual AHI is 3.7 on 7 to 14 cm of water with EPR of 3.  Leak in the 95th percentile is 17.4 L/min.  He reports that the CPAP is working well for him.  He denies any new issues.  HISTORY  12/26/2018: Please also see below for documentation of the virtual visit.   I reviewed his AutoPap compliance data from 11/24/2018 through 12/23/2018 which is a total of 30 days, during which time he used his machine every night with percent used days greater than 4 hours at 90%, indicating excellent compliance with an average usage of 6 hours and 4 minutes, residual AHI at goal at 4.2 per hour, 95th percentile of pressure at 12.6 cm, leak on the higher end with the 95th percentile at 18.9 L/m on a pressure range of 7 cm to 14 cm with EPR.   The patient's allergies, current medications, family history, past medical history, past social history, past surgical history and problem list were reviewed and updated as appropriate.     REVIEW OF SYSTEMS: Out of a complete 14 system review of symptoms, the patient complains only of the following symptoms, and all other reviewed systems are negative.  See HPI  ALLERGIES: Allergies  Allergen Reactions   Shrimp [Shellfish Allergy] Anaphylaxis and Rash   Codeine Rash and Other (See Comments)    Blacked out   Contrast Media [Iodinated Diagnostic Agents] Rash   Diazepam Rash    Rash over whole body from neck down, and he lost his voice (airway compromise?)   Amoxicillin Other (See Comments)    Unknown    Benadryl [Diphenhydramine]     With dyes    Depakote [Divalproex Sodium]  Swelling    Per pts report (given by Dr. Inda Merlin).  Has throat swelling and rash.   Erythromycin Other (See Comments)    unknown   Flexeril [Cyclobenzaprine]    Allegra [Fexofenadine] Rash        Betadine [Povidone Iodine] Rash   Celebrex [Celecoxib] Rash   Meperidine And Related Rash   Methocarbamol Rash   Neosporin [Neomycin-Bacitracin Zn-Polymyx] Rash   Oxycodone Rash   Penicillins Rash   Plavix [Clopidogrel] Rash   Rivaroxaban Rash   Vioxx [Rofecoxib] Rash    HOME MEDICATIONS: Outpatient Medications Prior to Visit  Medication Sig Dispense Refill    allopurinol (ZYLOPRIM) 100 MG tablet TAKE 1 TABLET EVERY DAY 90 tablet 1   amitriptyline (ELAVIL) 100 MG tablet Take 1 tablet (100 mg total) by mouth at bedtime. 90 tablet 0   aspirin 81 MG tablet Take 81 mg by mouth daily.     Cholecalciferol (VITAMIN D PO) Take 1 tablet by mouth daily.     Coenzyme Q10 (CO Q 10 PO) Take by mouth daily.     Cyanocobalamin (VITAMIN B 12 PO) Take by mouth.     DULoxetine (CYMBALTA) 60 MG capsule TAKE 1 CAPSULE EVERY DAY 90 capsule 3   EPINEPHrine 0.3 mg/0.3 mL IJ SOAJ injection INJECT 0.3 MLS INTO THE MUSCLE ONCE 2 mL 2   Fremanezumab-vfrm (AJOVY) 225 MG/1.5ML SOAJ Inject 225 mg into the skin every 30 (thirty) days. 3 pen 0   gabapentin (NEURONTIN) 300 MG capsule Take 1 capsule (300 mg total) by mouth 2 (two) times daily. 180 capsule 0   Multiple Vitamin (MULTIVITAMIN) tablet Take 1 tablet by mouth daily.     naproxen (NAPROSYN) 500 MG tablet Take 500 mg by mouth 2 (two) times daily with a meal.     Omega-3 Fatty Acids (FISH OIL) 1000 MG CAPS Take 1 capsule by mouth daily.     ondansetron (ZOFRAN ODT) 4 MG disintegrating tablet Take 1 tablet (4 mg total) by mouth every 4 (four) hours as needed for nausea or vomiting. 20 tablet 0   oxyCODONE-acetaminophen (PERCOCET) 5-325 MG tablet Take 1-2 tablets by mouth every 6 (six) hours as needed. 20 tablet 0   pravastatin (PRAVACHOL) 40 MG tablet TAKE 1 TABLET (40 MG TOTAL) BY MOUTH DAILY. 90 tablet 3   Psyllium (METAMUCIL PO) Take 2 tablets by mouth 2 (two) times daily.     Red Yeast Rice Extract (RED YEAST RICE PO) Take 1 tablet by mouth daily.     SUMAtriptan (IMITREX) 100 MG tablet TAKE 1 TABLET AS NEEDED FOR MIGRAINE. MAY REPEAT IN 2 HOURS IF HEADACHE PERSISTS OR RECURS. MAX 2 TABLETS IN 24 HOURS 27 tablet 0   tamsulosin (FLOMAX) 0.4 MG CAPS capsule Take 1 capsule (0.4 mg total) by mouth daily. 14 capsule 0   tiZANidine (ZANAFLEX) 4 MG tablet TAKE 1 TABLET THREE TIMES DAILY AS NEEDED FOR MUSCLE SPASM(S) 270 tablet 0    TURMERIC CURCUMIN PO Take 1,050 mg by mouth daily.     valACYclovir (VALTREX) 500 MG tablet Take 500 mg by mouth daily.     vitamin C (ASCORBIC ACID) 500 MG tablet Take 500 mg by mouth daily.     vitamin E 1000 UNIT capsule Take 1,000 Units by mouth daily.     Facility-Administered Medications Prior to Visit  Medication Dose Route Frequency Provider Last Rate Last Admin   0.9 %  sodium chloride infusion  500 mL Intravenous Once Ladene Artist, MD  PAST MEDICAL HISTORY: Past Medical History:  Diagnosis Date   ALLERGIC RHINITIS 04/22/2007   Allergy    Cancer ([place])    HX SKIN CANCER   Complication of anesthesia    more concerned about positioning of head & neck because of TMJ   DDD (degenerative disc disease), cervical    GERD (gastroesophageal reflux disease)    infrequently   Headache(784.0)    migraines   History of kidney stones    History of skin cancer    History of TMJ disorder    HYPERCHOLESTEROLEMIA 04/22/2007   HYPERLIPIDEMIA 07/29/2007   Hyperplastic colon polyp    NEPHROLITHIASIS, HX OF 07/29/2007   Osteoarth NOS-Unspec 04/22/2007   Shortness of breath    Shoulder pain, acute    bilateral    PAST SURGICAL HISTORY: Past Surgical History:  Procedure Laterality Date   APPENDECTOMY     BACK SURGERY  10/07/2015   COLONOSCOPY     10 yrs ago- 2 HPP    COLONOSCOPY  2019   JOINT REPLACEMENT     KNEE ARTHROSCOPY     right x2   SHOULDER SURGERY     right   skin cancer biopsy     L forehead   TOTAL KNEE ARTHROPLASTY Right 08/25/2013   Procedure: RIGHT TOTAL KNEE ARTHROPLASTY;  Surgeon: Gearlean Alf, MD;  Location: [facility];  Service: Orthopedics;  Laterality: Right;   TOTAL SHOULDER ARTHROPLASTY Left 12/25/2013   DR SUPPLE    TOTAL SHOULDER ARTHROPLASTY Left 12/25/2013   Procedure: LEFT TOTAL SHOULDER ARTHROPLASTY;  Surgeon: Marin Shutter, MD;  Location: [facility];  Service: Orthopedics;  Laterality: Left;   TOTAL SHOULDER ARTHROPLASTY Right 06/18/2014   DR SUPPLE    TOTAL SHOULDER ARTHROPLASTY Right 06/18/2014   Procedure: RIGHT TOTAL SHOULDER ARTHROPLASTY;  Surgeon: Marin Shutter, MD;  Location: [facility];  Service: Orthopedics;  Laterality: Right;   WISDOM TOOTH EXTRACTION      FAMILY HISTORY: Family History  Problem Relation Age of Onset   COPD Father        family hx   Emphysema Father    Aneurysm Mother    Stroke Mother    Sudden death Mother    Cancer Mother    Obesity Mother    Breast cancer Mother    Cancer Maternal Grandfather        lung   Colon cancer Neg Hx    Colon polyps Neg Hx     SOCIAL HISTORY: Social History   Socioeconomic History   Marital status: Married    Spouse name: Not on file   Number of children: 1   Years of education: 16+   Highest education level: Bachelor's degree (e.g., BA, AB, BS)  Occupational History   Occupation: Retired  Tobacco Use   Smoking status: Never   Smokeless tobacco: Never  Vaping Use   Vaping Use: Never used  Substance and Sexual Activity   Alcohol use: No    Alcohol/week: 0.0 standard drinks   Drug use: No   Sexual activity: Yes    Partners: Female    Birth control/protection: None  Other Topics Concern   Not on file  Social History Narrative   Lives at home w/ his wife   Right-handed   Caffeine: 2 cups of coffee each morning   Social Determinants of Health   Financial Resource Strain: Low Risk    Difficulty of Paying Living Expenses: Not hard at all  Food Insecurity: No Food Insecurity  Worried About Charity fundraiser in the Last Year: Never true   [place] in the Last Year: Never true  Transportation Needs: No Transportation Needs   Lack of Transportation (Medical): No   Lack of Transportation (Non-Medical): No  Physical Activity: Sufficiently Active   Days of Exercise per Week: 7 days   Minutes of Exercise per Session: 60 min  Stress: No Stress Concern Present   Feeling of Stress : Not at all  Social Connections: Moderately Isolated   Frequency of  Communication with Friends and Family: More than three times a week   Frequency of Social Gatherings with Friends and Family: Once a week   Attends Religious Services: Never   Marine scientist or Organizations: No   Attends Music therapist: Never   Marital Status: Married  Human resources officer Violence: Not At Risk   Fear of Current or Ex-Partner: No   Emotionally Abused: No   Physically Abused: No   Sexually Abused: No      PHYSICAL EXAM Generalized: Well developed, in no acute distress   Neurological examination  Mentation: Alert oriented to time, place, history taking. Follows all commands speech and language fluent Cranial nerve II-XII:Extraocular movements were full. Facial symmetry noted. uvula tongue midline. Head turning and shoulder shrug  were normal and symmetric. Motor: Good strength throughout subjectively per patient Sensory: Sensory testing is intact to soft touch on all 4 extremities subjectively per patient Coordination: Cerebellar testing reveals good finger-nose-finger  Gait and station: Patient is able to stand from a seated position. gait is normal.  Reflexes: UTA  DIAGNOSTIC DATA (LABS, IMAGING, TESTING) - I reviewed patient records, labs, notes, testing and imaging myself where available.  Lab Results  Component Value Date   WBC 10.0 04/08/2021   HGB 15.8 04/08/2021   HCT 46.1 04/08/2021   MCV 95.1 04/08/2021   PLT 264 04/08/2021      Component Value Date/Time   NA 138 04/08/2021 2009   NA 139 08/17/2020 1011   K 3.8 04/08/2021 2009   CL 102 04/08/2021 2009   CO2 25 04/08/2021 2009   GLUCOSE 98 04/08/2021 2009   GLUCOSE 94 08/20/2006 0830   BUN 25 (H) 04/08/2021 2009   BUN 25 08/17/2020 1011   CREATININE 1.32 (H) 04/08/2021 2009   CREATININE 1.38 (H) 06/09/2020 0811   CALCIUM 9.3 04/08/2021 2009   PROT 7.3 08/17/2020 1011   ALBUMIN 4.6 08/17/2020 1011   AST 23 08/17/2020 1011   ALT 21 08/17/2020 1011   ALKPHOS 51  08/17/2020 1011   BILITOT 0.3 08/17/2020 1011   GFRNONAA 58 (L) 04/08/2021 2009   GFRAA 78 08/17/2020 1011   Lab Results  Component Value Date   CHOL 250 (H) 06/09/2020   HDL 93 06/09/2020   LDLCALC 142 (H) 06/09/2020   LDLDIRECT 135.0 07/16/2013   TRIG 59 06/09/2020   CHOLHDL 2.7 06/09/2020   Lab Results  Component Value Date   HGBA1C 5.3 03/15/2020   No results found for: VITAMINB12 Lab Results  Component Value Date   TSH 2.150 06/19/2019      ASSESSMENT AND PLAN 70 y.o. year old male  has a past medical history of ALLERGIC RHINITIS (04/22/2007), Allergy, Cancer ([place]), Complication of anesthesia, DDD (degenerative disc disease), cervical, GERD (gastroesophageal reflux disease), Headache(784.0), History of kidney stones, History of skin cancer, History of TMJ disorder, HYPERCHOLESTEROLEMIA (04/22/2007), HYPERLIPIDEMIA (07/29/2007), Hyperplastic colon polyp, NEPHROLITHIASIS, HX OF (07/29/2007), Osteoarth NOS-Unspec (04/22/2007), Shortness of breath,  and Shoulder pain, acute. here with:  1.  Obstructive sleep apnea on CPAP  -Good compliance -Good treatment of apnea -Encouraged to use CPAP nightly and greater than 4 hours each night  2.  Migraine headaches  -Continue amitriptyline 100 mg at bedtime -Continue gabapentin 300 mg twice a day -Continue tizanidine 4 mg 3 times a day as needed for muscle spasms -Continue Cymbalta 60 mg daily -Continue Ajovy monthly injection -Continue sumatriptan for abortive therapy     Ward Givens, MSN, NP-C 04/14/2021, 8:25 AM [facility] [address], [place], [place] [address] [phone]     agree with assessment and plan as stated.     Sarina Ill, MD [facility]

## 2021-04-18 ENCOUNTER — Ambulatory Visit (INDEPENDENT_AMBULATORY_CARE_PROVIDER_SITE_OTHER): Payer: Medicare HMO | Admitting: Family Medicine

## 2021-04-18 ENCOUNTER — Ambulatory Visit (HOSPITAL_BASED_OUTPATIENT_CLINIC_OR_DEPARTMENT_OTHER)
Admission: RE | Admit: 2021-04-18 | Discharge: 2021-04-18 | Disposition: A | Discharge status: Home / Self Care | Payer: Medicare HMO | Source: Ambulatory Visit | Attending: Family Medicine | Admitting: Family Medicine

## 2021-04-18 ENCOUNTER — Other Ambulatory Visit: Payer: Self-pay

## 2021-04-18 ENCOUNTER — Encounter: Payer: Self-pay | Admitting: Family Medicine

## 2021-04-18 VITALS — BP 132/96 | HR 70 | Temp 98.0°F | Wt 242.6 lb

## 2021-04-18 DIAGNOSIS — R635 Abnormal weight gain: Secondary | ICD-10-CM | POA: Diagnosis not present

## 2021-04-18 DIAGNOSIS — K7689 Other specified diseases of liver: Secondary | ICD-10-CM

## 2021-04-18 DIAGNOSIS — R0602 Shortness of breath: Secondary | ICD-10-CM

## 2021-04-18 DIAGNOSIS — N200 Calculus of kidney: Secondary | ICD-10-CM

## 2021-04-18 LAB — COMPREHENSIVE METABOLIC PANEL
ALT: 34 U/L (ref 0–53)
AST: 25 U/L (ref 0–37)
Albumin: 4.2 g/dL (ref 3.5–5.2)
Alkaline Phosphatase: 43 U/L (ref 39–117)
BUN: 24 mg/dL — ABNORMAL HIGH (ref 6–23)
CO2: 27 mEq/L (ref 19–32)
Calcium: 9.1 mg/dL (ref 8.4–10.5)
Chloride: 103 mEq/L (ref 96–112)
Creatinine, Ser: 1.24 mg/dL (ref 0.40–1.50)
GFR: 59.08 mL/min — ABNORMAL LOW (ref >60.00)
Glucose, Bld: 117 mg/dL — ABNORMAL HIGH (ref 70–99)
Potassium: 4.5 mEq/L (ref 3.5–5.1)
Sodium: 137 mEq/L (ref 135–145)
Total Bilirubin: 0.4 mg/dL (ref 0.2–1.2)
Total Protein: 6.9 g/dL (ref 6.0–8.3)

## 2021-04-18 LAB — POCT URINALYSIS DIPSTICK
Bilirubin, UA: NEGATIVE
Blood, UA: NEGATIVE
Glucose, UA: NEGATIVE
Ketones, UA: NEGATIVE
Leukocytes, UA: NEGATIVE
Nitrite, UA: NEGATIVE
Protein, UA: NEGATIVE
Spec Grav, UA: 1.015 (ref 1.010–1.025)
Urobilinogen, UA: NEGATIVE E.U./dL — AB
pH, UA: 6 (ref 5.0–8.0)

## 2021-04-18 LAB — CBC WITH DIFFERENTIAL/PLATELET
Basophils Absolute: 0.1 10*3/uL (ref 0.0–0.1)
Basophils Relative: 2.1 % (ref 0.0–3.0)
Eosinophils Absolute: 0.2 10*3/uL (ref 0.0–0.7)
Eosinophils Relative: 4.4 % (ref 0.0–5.0)
HCT: 44.5 % (ref 39.0–52.0)
Hemoglobin: 15.3 g/dL (ref 13.0–17.0)
Lymphocytes Relative: 39.5 % (ref 12.0–46.0)
Lymphs Abs: 1.8 10*3/uL (ref 0.7–4.0)
MCHC: 34.3 g/dL (ref 30.0–36.0)
MCV: 96.2 fl (ref 78.0–100.0)
Monocytes Absolute: 0.5 10*3/uL (ref 0.1–1.0)
Monocytes Relative: 10 % (ref 3.0–12.0)
Neutro Abs: 2 10*3/uL (ref 1.4–7.7)
Neutrophils Relative %: 44 % (ref 43.0–77.0)
Platelets: 225 10*3/uL (ref 150.0–400.0)
RBC: 4.62 Mil/uL (ref 4.22–5.81)
RDW: 13.2 % (ref 11.5–15.5)
WBC: 4.6 10*3/uL (ref 4.0–10.5)

## 2021-04-18 LAB — TSH: TSH: 2.8 u[IU]/mL (ref 0.35–5.50)

## 2021-04-18 LAB — BRAIN NATRIURETIC PEPTIDE: Pro B Natriuretic peptide (BNP): 8 pg/mL (ref 0.0–100.0)

## 2021-04-18 LAB — T4, FREE: Free T4: 0.59 ng/dL — ABNORMAL LOW (ref 0.60–1.60)

## 2021-04-18 MED ORDER — TAMSULOSIN HCL 0.4 MG PO CAPS: 0.4000 mg | ORAL_CAPSULE | Freq: Every day | ORAL | 0 refills | Status: DC

## 2021-04-18 NOTE — Addendum Note (Signed)
Addended by: Grier Mitts R on: 04/18/2021 10:16 AM   Modules accepted: Orders

## 2021-04-18 NOTE — Progress Notes (Addendum)
Subjective:    Patient ID: [name] [name], male    DOB: 11-Apr-1951, 70 y.o.   MRN: [id number]  Chief Complaint  Patient presents with   Follow-up    Blood work concerns, medications and kidney stones he had.     HPI Patient was seen today for f/u on ED visist 7/15.  Pt with renal calculi on CT. obstructing calculus in distal left ureter 5 mm and nonobstructing calculus in right kidney also 5 mm.  Incidental finding of several benign hepatic cyst noted.  Thought 2/2 regional fatty infiltration.  Since being home pt denies hematuria, continued pain, or seeing stone.  Patient concerned about renal function.  Trying to increase p.o. intake of water.  Did not schedule appointment with urology.  Endorses SOB 2/2 increased weight and having to wear a mask.  Plans to restart being seen at healthy weight clinic.  Walking several miles daily for exercise.    Pt collects baseball cards.  Was planning to go to [employer] baseball card convention in [place], [place], and [facility], but cancelled trip 2/2 kidney stones.  Past Medical History:  Diagnosis Date   ALLERGIC RHINITIS 04/22/2007   Allergy    Cancer ([place])    HX SKIN CANCER   Complication of anesthesia    more concerned about positioning of head & neck because of TMJ   DDD (degenerative disc disease), cervical    GERD (gastroesophageal reflux disease)    infrequently   Headache(784.0)    migraines   History of kidney stones    History of skin cancer    History of TMJ disorder    HYPERCHOLESTEROLEMIA 04/22/2007   HYPERLIPIDEMIA 07/29/2007   Hyperplastic colon polyp    NEPHROLITHIASIS, HX OF 07/29/2007   Osteoarth NOS-Unspec 04/22/2007   Shortness of breath    Shoulder pain, acute    bilateral    Allergies  Allergen Reactions   Shrimp [Shellfish Allergy] Anaphylaxis and Rash   Codeine Rash and Other (See Comments)    Blacked out   Contrast Media [Iodinated Diagnostic Agents] Rash   Diazepam Rash    Rash over whole body  from neck down, and he lost his voice (airway compromise?)   Amoxicillin Other (See Comments)    Unknown    Benadryl [Diphenhydramine]     With dyes    Depakote [Divalproex Sodium] Swelling    Per pts report (given by Dr. Inda Merlin).  Has throat swelling and rash.   Erythromycin Other (See Comments)    unknown   Flexeril [Cyclobenzaprine]    Allegra [Fexofenadine] Rash        Betadine [Povidone Iodine] Rash   Celebrex [Celecoxib] Rash   Meperidine And Related Rash   Methocarbamol Rash   Neosporin [Neomycin-Bacitracin Zn-Polymyx] Rash   Oxycodone Rash   Penicillins Rash   Plavix [Clopidogrel] Rash   Rivaroxaban Rash   Vioxx [Rofecoxib] Rash   Family History  Problem Relation Age of Onset   COPD Father        family hx   Emphysema Father    Aneurysm Mother    Stroke Mother    Sudden death Mother    Cancer Mother    Obesity Mother    Breast cancer Mother    Cancer Maternal Grandfather        lung   Colon cancer Neg Hx    Colon polyps Neg Hx      ROS General: Denies fever, chills, night sweats, changes in appetite +weight gain  HEENT: Denies headaches, ear pain, changes in vision, rhinorrhea, sore throat CV: Denies CP, palpitations, SOB, orthopnea +SOB Pulm: Denies SOB, cough, wheezing +SOB GI: Denies abdominal pain, nausea, vomiting, diarrhea, constipation GU: Denies dysuria, hematuria, frequency  +renal calculi Msk: Denies muscle cramps, joint pains Neuro: Denies weakness, numbness, tingling Skin: Denies rashes, bruising Psych: Denies depression, anxiety, hallucinations    Objective:    Blood pressure (!) 132/96, pulse 70, temperature 98 F (36.7 C), temperature source Oral, weight 242 lb 9.6 oz (110 kg), SpO2 96 %.  pO2 93% while walking.  Gen. Pleasant, well-nourished, in no distress, normal affect   HEENT: [place]/AT, face symmetric, conjunctiva clear, no scleral icterus, PERRLA, EOMI, nares patent without drainage Lungs: No accessory muscle use, CTAB, no  wheezes or rales Cardiovascular: RRR,  r/g/m, no peripheral edema Musculoskeletal: No deformities, no cyanosis or clubbing, normal tone Neuro:  A&Ox3, CN II-XII intact, normal gait Skin:  Warm, no lesions/ rash.  Well-healed surgical incision midline right knee   Wt Readings from Last 3 Encounters:  04/18/21 242 lb 9.6 oz (110 kg)  04/08/21 205 lb 14.6 oz (93.4 kg)  08/17/20 206 lb (93.4 kg)    Lab Results  Component Value Date   WBC 10.0 04/08/2021   HGB 15.8 04/08/2021   HCT 46.1 04/08/2021   PLT 264 04/08/2021   GLUCOSE 98 04/08/2021   CHOL 250 (H) 06/09/2020   TRIG 59 06/09/2020   HDL 93 06/09/2020   LDLDIRECT 135.0 07/16/2013   LDLCALC 142 (H) 06/09/2020   ALT 21 08/17/2020   AST 23 08/17/2020   NA 138 04/08/2021   K 3.8 04/08/2021   CL 102 04/08/2021   CREATININE 1.32 (H) 04/08/2021   BUN 25 (H) 04/08/2021   CO2 25 04/08/2021   TSH 2.150 06/19/2019   PSA 1.97 06/09/2020   INR 0.98 06/10/2014   HGBA1C 5.3 03/15/2020    Assessment/Plan:  Liver cyst  -CT stone study 04/08/2021 with several round lesions on liver typical of benign hepatic cyst.  Heterogeneous density suggestive of a regional fatty infiltrate. -Discussed obtaining MRI of liver for further evaluation.  Patient wishes to wait at this time. -CMP normal 04/08/2021 -Lifestyle modifications -Continue to monitor - Plan: CMP  SOB (shortness of breath) -pO2 decreased from 96% to 93% with walking on RA -CXR - Plan: CMP, Brain Natriuretic Peptide, CBC with Differential/Platelet, TSH, T4, Free, DG Chest 2 View  Weight gain -Lifestyle modifications -Follow-up with healthy weight - Plan: CMP, TSH, T4, Free  Renal calculi  -CT renal study 04/08/2021 with mild obstructing calculus 5 mm in left distal ureter at vesicoureteral junction.  Mild hydronephrosis and hydroureter on the left.  Nonobstructing right renal calculus 5 mm. -Continue Flomax and increasing p.o. intake of fluids -flomax refilled as given 2  wk supply in ED. -Referral placed for urology - Plan: POCT urinalysis dipstick, Ambulatory referral to Urology, Flomax  F/u as needed in the next month  Grier Mitts, MD

## 2021-04-22 ENCOUNTER — Other Ambulatory Visit: Payer: Self-pay | Admitting: Neurology

## 2021-04-22 DIAGNOSIS — G43109 Migraine with aura, not intractable, without status migrainosus: Secondary | ICD-10-CM

## 2021-04-27 ENCOUNTER — Other Ambulatory Visit: Payer: Self-pay | Admitting: Neurology

## 2021-05-03 DIAGNOSIS — N202 Calculus of kidney with calculus of ureter: Secondary | ICD-10-CM | POA: Diagnosis not present

## 2021-05-10 ENCOUNTER — Ambulatory Visit (INDEPENDENT_AMBULATORY_CARE_PROVIDER_SITE_OTHER): Payer: Medicare HMO | Admitting: Family Medicine

## 2021-05-10 ENCOUNTER — Encounter (INDEPENDENT_AMBULATORY_CARE_PROVIDER_SITE_OTHER): Payer: Self-pay | Admitting: Family Medicine

## 2021-05-10 ENCOUNTER — Other Ambulatory Visit: Payer: Self-pay

## 2021-05-10 VITALS — BP 138/80 | HR 64 | Temp 98.1°F | Ht 69.0 in | Wt 241.0 lb

## 2021-05-10 DIAGNOSIS — Z6833 Body mass index (BMI) 33.0-33.9, adult: Secondary | ICD-10-CM

## 2021-05-10 DIAGNOSIS — F3289 Other specified depressive episodes: Secondary | ICD-10-CM | POA: Diagnosis not present

## 2021-05-10 DIAGNOSIS — E669 Obesity, unspecified: Secondary | ICD-10-CM | POA: Diagnosis not present

## 2021-05-11 NOTE — Progress Notes (Signed)
Chief Complaint:   OBESITY [name] [name] is here to discuss his progress with his obesity treatment plan along with follow-up of his obesity related diagnoses. [name] [name] is on keeping a food journal and adhering to recommended goals of 1500-1800 calories and 100+ grams of protein daily and states he is following his eating plan approximately 35% of the time. [name] [name] states he is doing 0 minutes 0 times per week.  Today's visit was #: 54 Starting weight: 230 lbs Starting date: 06/18/2017 Today's weight: 241 lbs Today's date: 05/10/2021 Total lbs lost to date: 0 Total lbs lost since last in-office visit: 0  Interim History: [name] [name]'s last visit was approximately 6 months ago, and he has had a lot of health issues and has gotten off track. He is disappointed in himself, but he is ready to get back on track.  Subjective:   1. Other depression Trennon is stable on Cymbalta. He is hard on himself regarding his weight gain, but he is ready to take charge of the problem. He denies suicidal or homicidal ideations.  Assessment/Plan:   1. Other depression Behavior modification techniques were discussed today to help [name] [name] deal with his emotional/non-hunger eating behaviors. [name] [name] was offered encouragement and support, and I will continue to work with him to help get past his all-or-nothing personality tendencies. Orders and follow up as documented in patient record.   2. Obesity with current BMI 35.6 [name] [name] is currently in the action stage of change. As such, his goal is to get back to weightloss efforts . He has agreed to the Category 3 Plan.   [name] [name] is to start the Category 3 plan, no journaling needed for now as this may be a trigger for his perfectionist tendencies.  Behavioral modification strategies: increasing lean protein intake.  [name] [name] has agreed to follow-up with our clinic in 2 to 3 weeks. He was informed of the importance of frequent follow-up visits to maximize his success with intensive  lifestyle modifications for his multiple health conditions.   Objective:   Blood pressure 138/80, pulse 64, temperature 98.1 F (36.7 C), height '5\' 9"'$  (1.753 m), weight 241 lb (109.3 kg), SpO2 96 %. Body mass index is 35.59 kg/m.  General: Cooperative, alert, well developed, in no acute distress. HEENT: Conjunctivae and lids unremarkable. Cardiovascular: Regular rhythm.  Lungs: Normal work of breathing. Neurologic: No focal deficits.   Lab Results  Component Value Date   CREATININE 1.24 04/18/2021   BUN 24 (H) 04/18/2021   NA 137 04/18/2021   K 4.5 04/18/2021   CL 103 04/18/2021   CO2 27 04/18/2021   Lab Results  Component Value Date   ALT 34 04/18/2021   AST 25 04/18/2021   ALKPHOS 43 04/18/2021   BILITOT 0.4 04/18/2021   Lab Results  Component Value Date   HGBA1C 5.3 03/15/2020   HGBA1C 5.7 (H) 06/19/2019   HGBA1C 5.5 11/21/2017   HGBA1C 5.8 (H) 06/18/2017   Lab Results  Component Value Date   INSULIN 5.0 03/15/2020   INSULIN 8.5 06/19/2019   INSULIN 8.6 11/21/2017   INSULIN 16.5 06/18/2017   Lab Results  Component Value Date   TSH 2.80 04/18/2021   Lab Results  Component Value Date   CHOL 250 (H) 06/09/2020   HDL 93 06/09/2020   LDLCALC 142 (H) 06/09/2020   LDLDIRECT 135.0 07/16/2013   TRIG 59 06/09/2020   CHOLHDL 2.7 06/09/2020   Lab Results  Component Value Date   VD25OH 89.1 03/15/2020   VD25OH  78.1 06/19/2019   VD25OH 59.6 11/21/2017   Lab Results  Component Value Date   WBC 4.6 04/18/2021   HGB 15.3 04/18/2021   HCT 44.5 04/18/2021   MCV 96.2 04/18/2021   PLT 225.0 04/18/2021   No results found for: IRON, TIBC, FERRITIN  Attestation Statements:   Reviewed by clinician on day of visit: allergies, medications, problem list, medical history, surgical history, family history, social history, and previous encounter notes.  Time spent on visit including pre-visit chart review and post-visit care and charting was 42 minutes.    I,  Trixie Dredge, am acting as transcriptionist for Dennard Nip, MD.  I have reviewed the above documentation for accuracy and completeness, and I agree with the above. -  Dennard Nip, MD

## 2021-05-20 ENCOUNTER — Encounter (INDEPENDENT_AMBULATORY_CARE_PROVIDER_SITE_OTHER): Payer: Self-pay | Admitting: Family Medicine

## 2021-05-23 NOTE — Telephone Encounter (Signed)
Pt last seen by Dr. Beasley.  

## 2021-06-01 DIAGNOSIS — G4733 Obstructive sleep apnea (adult) (pediatric): Secondary | ICD-10-CM | POA: Diagnosis not present

## 2021-06-02 ENCOUNTER — Ambulatory Visit (INDEPENDENT_AMBULATORY_CARE_PROVIDER_SITE_OTHER): Payer: Medicare HMO | Admitting: Family Medicine

## 2021-06-02 ENCOUNTER — Encounter (INDEPENDENT_AMBULATORY_CARE_PROVIDER_SITE_OTHER): Payer: Self-pay | Admitting: Family Medicine

## 2021-06-02 ENCOUNTER — Other Ambulatory Visit: Payer: Self-pay

## 2021-06-02 VITALS — BP 112/77 | HR 62 | Temp 97.9°F | Ht 69.0 in | Wt 234.0 lb

## 2021-06-02 DIAGNOSIS — R7303 Prediabetes: Secondary | ICD-10-CM | POA: Diagnosis not present

## 2021-06-02 DIAGNOSIS — Z6833 Body mass index (BMI) 33.0-33.9, adult: Secondary | ICD-10-CM | POA: Diagnosis not present

## 2021-06-02 DIAGNOSIS — E669 Obesity, unspecified: Secondary | ICD-10-CM | POA: Diagnosis not present

## 2021-06-02 NOTE — Progress Notes (Signed)
Chief Complaint:   OBESITY [name] [name] is here to discuss his progress with his obesity treatment plan along with follow-up of his obesity related diagnoses. [name] [name] is on the Category 3 Plan and states he is following his eating plan approximately 50% of the time. [name] [name] states he is walking for 2 hours 6 times per week.   Today's visit was #: 72 Starting weight: 230 lbs Starting date: 06/18/2017 Today's weight: 234 lbs Today's date: 06/02/2021 Total lbs lost to date: 0 Total lbs lost since last in-office visit: 7  Interim History: [name] [name] continues to do well with weight loss. He had extra challenges with meal planning due to having to eat out more while work was done in his house. He avoided fast food and ice cream. He ate extra fruit and vegetables. He has increased walking to 7 miles per day.  Subjective:   1. Pre-diabetes [name] continues to work on diet and exercise to help treat.  Assessment/Plan:   1. Pre-diabetes [name] [name] will continue with diet, exercise to help decreased the risk of diabetes, and will continue to monitor.  2. Obesity with current BMI 34.6 [name] [name] is currently in the action stage of change. As such, his goal is to continue with weight loss efforts. He has agreed to the Category 3 Plan.   Exercise goals: As is.  Behavioral modification strategies: increasing lean protein intake and meal planning and cooking strategies.  [name] [name] has agreed to follow-up with our clinic in 3 weeks. He was informed of the importance of frequent follow-up visits to maximize his success with intensive lifestyle modifications for his multiple health conditions.   Objective:   Blood pressure 112/77, pulse 62, temperature 97.9 F (36.6 C), height '5\' 9"'$  (1.753 m), weight 234 lb (106.1 kg), SpO2 97 %. Body mass index is 34.56 kg/m.  General: Cooperative, alert, well developed, in no acute distress. HEENT: Conjunctivae and lids unremarkable. Cardiovascular: Regular rhythm.  Lungs: Normal  work of breathing. Neurologic: No focal deficits.   Lab Results  Component Value Date   CREATININE 1.24 04/18/2021   BUN 24 (H) 04/18/2021   NA 137 04/18/2021   K 4.5 04/18/2021   CL 103 04/18/2021   CO2 27 04/18/2021   Lab Results  Component Value Date   ALT 34 04/18/2021   AST 25 04/18/2021   ALKPHOS 43 04/18/2021   BILITOT 0.4 04/18/2021   Lab Results  Component Value Date   HGBA1C 5.3 03/15/2020   HGBA1C 5.7 (H) 06/19/2019   HGBA1C 5.5 11/21/2017   HGBA1C 5.8 (H) 06/18/2017   Lab Results  Component Value Date   INSULIN 5.0 03/15/2020   INSULIN 8.5 06/19/2019   INSULIN 8.6 11/21/2017   INSULIN 16.5 06/18/2017   Lab Results  Component Value Date   TSH 2.80 04/18/2021   Lab Results  Component Value Date   CHOL 250 (H) 06/09/2020   HDL 93 06/09/2020   LDLCALC 142 (H) 06/09/2020   LDLDIRECT 135.0 07/16/2013   TRIG 59 06/09/2020   CHOLHDL 2.7 06/09/2020   Lab Results  Component Value Date   VD25OH 89.1 03/15/2020   VD25OH 78.1 06/19/2019   VD25OH 59.6 11/21/2017   Lab Results  Component Value Date   WBC 4.6 04/18/2021   HGB 15.3 04/18/2021   HCT 44.5 04/18/2021   MCV 96.2 04/18/2021   PLT 225.0 04/18/2021   No results found for: IRON, TIBC, FERRITIN  Attestation Statements:   Reviewed by clinician on day of visit: allergies, medications,  problem list, medical history, surgical history, family history, social history, and previous encounter notes.  Time spent on visit including pre-visit chart review and post-visit care and charting was 33 minutes.    I, Trixie Dredge, am acting as transcriptionist for Dennard Nip, MD.  I have reviewed the above documentation for accuracy and completeness, and I agree with the above. -  Dennard Nip, MD

## 2021-06-13 ENCOUNTER — Telehealth: Payer: Self-pay | Admitting: *Deleted

## 2021-06-13 NOTE — Telephone Encounter (Signed)
Pt states that he has more important visits coming up soon and he would like to not make his AWV at this time.

## 2021-06-16 ENCOUNTER — Other Ambulatory Visit: Payer: Self-pay

## 2021-06-16 ENCOUNTER — Ambulatory Visit (INDEPENDENT_AMBULATORY_CARE_PROVIDER_SITE_OTHER): Payer: Medicare HMO

## 2021-06-16 DIAGNOSIS — Z23 Encounter for immunization: Secondary | ICD-10-CM | POA: Diagnosis not present

## 2021-06-24 ENCOUNTER — Encounter (INDEPENDENT_AMBULATORY_CARE_PROVIDER_SITE_OTHER): Payer: Self-pay | Admitting: Family Medicine

## 2021-06-24 ENCOUNTER — Ambulatory Visit (INDEPENDENT_AMBULATORY_CARE_PROVIDER_SITE_OTHER): Payer: Medicare HMO | Admitting: Family Medicine

## 2021-06-24 ENCOUNTER — Other Ambulatory Visit: Payer: Self-pay

## 2021-06-24 VITALS — BP 117/80 | HR 73 | Temp 97.4°F | Ht 69.0 in | Wt 234.0 lb

## 2021-06-24 DIAGNOSIS — E669 Obesity, unspecified: Secondary | ICD-10-CM

## 2021-06-24 DIAGNOSIS — R7303 Prediabetes: Secondary | ICD-10-CM | POA: Diagnosis not present

## 2021-06-24 DIAGNOSIS — R1030 Lower abdominal pain, unspecified: Secondary | ICD-10-CM

## 2021-06-24 DIAGNOSIS — Z6833 Body mass index (BMI) 33.0-33.9, adult: Secondary | ICD-10-CM

## 2021-06-24 MED ORDER — OZEMPIC (0.25 OR 0.5 MG/DOSE) 2 MG/1.5ML ~~LOC~~ SOPN: 0.2500 mg | PEN_INJECTOR | SUBCUTANEOUS | 0 refills | Status: DC

## 2021-06-24 NOTE — Progress Notes (Signed)
Chief Complaint:   OBESITY [name] [name] is here to discuss his progress with his obesity treatment plan along with follow-up of his obesity related diagnoses. [name] [name] is on the Category 3 Plan and states he is following his eating plan approximately 40% of the time. [name] [name] states he is walking for 1.5-2 hours 5 times per week.   Today's visit was #: 28 Starting weight: 230 lbs Starting date: 06/18/2017 Today's weight: 234 lbs Today's date: 06/24/2021 Total lbs lost to date: 0 Total lbs lost since last in-office visit: 0  Interim History: [name] [name] has been working on increasing walking and he has a groin pull. He stopped walking but his pain is still present. He has been making more smarter choices, but sometimes he eats more volume than on his plan.  Subjective:   1. Inguinal pain, unspecified laterality [name] [name] has been very active with walking. He notes groin pain.  2. Pre-diabetes [name] [name] notes polyphagia and he struggles with portion control.   Assessment/Plan:   1. Inguinal pain, unspecified laterality We will refer [name] [name] to see [name] [name] for evaluation.  2. Pre-diabetes [name] [name] agreed to start Ozempic 0.25 mg q weekly with no refills. He will continue to work on weight loss, exercise, and decreasing simple carbohydrates to help decrease the risk of diabetes.   3. Obesity with current BMI 34.6 [name] [name] is currently in the action stage of change. As such, his goal is to continue with weight loss efforts. He has agreed to the Category 3 Plan.   Exercise goals: As is.  Behavioral modification strategies: increasing lean protein intake and decreasing simple carbohydrates.  [name] [name] has agreed to follow-up with our clinic in 2 to 3 weeks. He was informed of the importance of frequent follow-up visits to maximize his success with intensive lifestyle modifications for his multiple health conditions.   Objective:   Blood pressure 117/80, pulse 73, temperature (!) 97.4 F (36.3 C), height 5'  9" (1.753 m), weight 234 lb (106.1 kg), SpO2 97 %. Body mass index is 34.56 kg/m.  General: Cooperative, alert, well developed, in no acute distress. HEENT: Conjunctivae and lids unremarkable. Cardiovascular: Regular rhythm.  Lungs: Normal work of breathing. Neurologic: No focal deficits.   Lab Results  Component Value Date   CREATININE 1.24 04/18/2021   BUN 24 (H) 04/18/2021   NA 137 04/18/2021   K 4.5 04/18/2021   CL 103 04/18/2021   CO2 27 04/18/2021   Lab Results  Component Value Date   ALT 34 04/18/2021   AST 25 04/18/2021   ALKPHOS 43 04/18/2021   BILITOT 0.4 04/18/2021   Lab Results  Component Value Date   HGBA1C 5.3 03/15/2020   HGBA1C 5.7 (H) 06/19/2019   HGBA1C 5.5 11/21/2017   HGBA1C 5.8 (H) 06/18/2017   Lab Results  Component Value Date   INSULIN 5.0 03/15/2020   INSULIN 8.5 06/19/2019   INSULIN 8.6 11/21/2017   INSULIN 16.5 06/18/2017   Lab Results  Component Value Date   TSH 2.80 04/18/2021   Lab Results  Component Value Date   CHOL 250 (H) 06/09/2020   HDL 93 06/09/2020   LDLCALC 142 (H) 06/09/2020   LDLDIRECT 135.0 07/16/2013   TRIG 59 06/09/2020   CHOLHDL 2.7 06/09/2020   Lab Results  Component Value Date   VD25OH 89.1 03/15/2020   VD25OH 78.1 06/19/2019   VD25OH 59.6 11/21/2017   Lab Results  Component Value Date   WBC 4.6 04/18/2021   HGB 15.3 04/18/2021  HCT 44.5 04/18/2021   MCV 96.2 04/18/2021   PLT 225.0 04/18/2021   No results found for: IRON, TIBC, FERRITIN  Obesity Behavioral Intervention:   Approximately 15 minutes were spent on the discussion below.  ASK: We discussed the diagnosis of obesity with [name] [name] today and [name] [name] agreed to give [name] permission to discuss obesity behavioral modification therapy today.  ASSESS: [name] has the diagnosis of obesity and his BMI today is 34.6. [name] is in the action stage of change.   ADVISE: [name] was educated on the multiple health risks of obesity as well as the benefit of  weight loss to improve his health. He was advised of the need for long term treatment and the importance of lifestyle modifications to improve his current health and to decrease his risk of future health problems.  AGREE: Multiple dietary modification options and treatment options were discussed and [name] agreed to follow the recommendations documented in the above note.  ARRANGE: [name] was educated on the importance of frequent visits to treat obesity as outlined per CMS and USPSTF guidelines and agreed to schedule his next follow up appointment today.  Attestation Statements:   Reviewed by clinician on day of visit: allergies, medications, problem list, medical history, surgical history, family history, social history, and previous encounter notes.   I, [name] [name], am acting as transcriptionist for [name] Nip, MD.  I have reviewed the above documentation for accuracy and completeness, and I agree with the above. -  [name] Nip, MD

## 2021-06-28 ENCOUNTER — Ambulatory Visit (INDEPENDENT_AMBULATORY_CARE_PROVIDER_SITE_OTHER): Payer: Medicare HMO

## 2021-06-28 ENCOUNTER — Other Ambulatory Visit: Payer: Self-pay

## 2021-06-28 ENCOUNTER — Ambulatory Visit: Payer: Medicare HMO | Admitting: Family Medicine

## 2021-06-28 VITALS — BP 130/86 | HR 67 | Ht 69.0 in | Wt 243.6 lb

## 2021-06-28 DIAGNOSIS — M25552 Pain in left hip: Secondary | ICD-10-CM | POA: Diagnosis not present

## 2021-06-28 NOTE — Progress Notes (Signed)
I, Peterson Lombard, LAT, ATC acting as a scribe for [name], MD.  Subjective:    CC: L groin pain  HPI: Pt is a 70 y/o c/o L groin pain x several weeks. Pt had been walking 5-10 miles/day up until a few days ago, at a 15 mins/mile pace. Pt locates pain to deep within L groin. Pt is very frustrated because he isn't able to exercise like he wants and he's gained back the 60 lbs that he had previously lost through his work w/ [employer] [facility].  Patient is a significant motivated exerciser.  He notes he has a tendency to go 110% with his exercises and gets distressed when he cannot exercise normally.  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  History of lumbar fusion in the past.   Objective:    Vitals:   06/28/21 0809  BP: 130/86  Pulse: 67  SpO2: 98%   General: Well Developed, well nourished, and in no acute distress.   MSK: Left hip normal-appearing Normal motion pain with flexion and internal rotation. Intact strength some pain with resisted hip flexion. Negative slump test bilaterally for lumbar spine. Reflexes and sensation are intact distally.  Lab and Radiology Results  X-ray images left hip obtained today personally and independently interpreted. No acute fractures.  No significant degenerative changes. Await formal radiology review  X-ray images left hip and lumbar spine obtained CT scan abdomen and pelvis dated April 08 2021 History of lumbar fusion with degenerative changes lumbar spine.  No significant hip DJD or fracture.    Impression and Recommendations:    Assessment and Plan: 70 y.o. male with left groin pain.  Patient does not have enough DJD on hip x-ray to explain his hip pain.  Pain may be hip flexor strain or hip adductor strain.  Possible that he does have a  femoral neck stress fracture to explain his pain however I did not see that on x-ray today. Additionally hip impingement or labrum tear is also a possibility as his L2 or L3 lumbar radiculopathy.  Plan for relative rest avoiding exacerbating activities such as walking.  He notes that he can cycle without pain or use a rowing machine without pain both of which I think would be good alternatives.  Additionally refer to PT and check back in 4 to 6 weeks.  PDMP not reviewed this encounter. Orders Placed This Encounter  Procedures   DG HIP UNILAT W OR W/O PELVIS 2-3 VIEWS LEFT    Standing Status:   Future    Number of Occurrences:   1    Standing Expiration Date:   07/29/2021    Order Specific Question:   Reason for Exam (SYMPTOM  OR DIAGNOSIS REQUIRED)    Answer:   L hip pain    Order Specific Question:   Preferred imaging location?    Answer:   [facility]   Ambulatory referral to Physical Therapy    Referral Priority:   Routine    Referral Type:   Physical Medicine    Referral Reason:   Specialty Services Required    Requested Specialty:   Physical Therapy    Number of Visits Requested:   1   No orders of the defined types were placed in this encounter.   Discussed warning signs or symptoms. Please see discharge instructions. Patient expresses understanding.  The above documentation has been reviewed and is accurate and complete [name], M.D.

## 2021-06-28 NOTE — Patient Instructions (Addendum)
Thank you for coming in today. Nice to meet you.  Please get an Xray today before you leave.  I've referred you to Physical Therapy. They will call you to schedule or you can call them directly.  Please let me know if you have not heard from them in one week regarding scheduling.  I recommend you obtained a compression sleeve to help with your joint problems. There are many options on the market however I recommend obtaining a L thigh Body Helix compression sleeve.  You can find information (including how to appropriate measure yourself for sizing) can be found at www.Body http://www.lambert.com/.  Many of these products are health savings account (HSA) eligible.   You can use the compression sleeve at any time throughout the day but is most important to use while being active as well as for 2 hours post-activity.   It is appropriate to ice following activity with the compression sleeve in place.   Don't walk for exercise if you're limping.  Cycling should be ok.  Follow-up in 6 weeks.

## 2021-06-29 ENCOUNTER — Encounter: Payer: Self-pay | Admitting: Family Medicine

## 2021-06-29 ENCOUNTER — Encounter (INDEPENDENT_AMBULATORY_CARE_PROVIDER_SITE_OTHER): Payer: Self-pay | Admitting: Family Medicine

## 2021-06-29 NOTE — Telephone Encounter (Signed)
Last OV with Dr. Beasley 

## 2021-06-29 NOTE — Progress Notes (Signed)
Left hip x-ray shows no acute fractures or dislocation.  Mild bilateral hip arthritis is present.

## 2021-07-06 ENCOUNTER — Other Ambulatory Visit: Payer: Self-pay

## 2021-07-06 ENCOUNTER — Ambulatory Visit (HOSPITAL_BASED_OUTPATIENT_CLINIC_OR_DEPARTMENT_OTHER): Payer: Medicare HMO | Attending: Family Medicine | Admitting: Physical Therapy

## 2021-07-06 ENCOUNTER — Encounter (HOSPITAL_BASED_OUTPATIENT_CLINIC_OR_DEPARTMENT_OTHER): Payer: Self-pay | Admitting: Physical Therapy

## 2021-07-06 DIAGNOSIS — M6281 Muscle weakness (generalized): Secondary | ICD-10-CM | POA: Diagnosis not present

## 2021-07-06 DIAGNOSIS — M25552 Pain in left hip: Secondary | ICD-10-CM | POA: Insufficient documentation

## 2021-07-06 DIAGNOSIS — R262 Difficulty in walking, not elsewhere classified: Secondary | ICD-10-CM | POA: Insufficient documentation

## 2021-07-06 DIAGNOSIS — Z981 Arthrodesis status: Secondary | ICD-10-CM | POA: Diagnosis not present

## 2021-07-06 NOTE — Therapy (Addendum)
OUTPATIENT PHYSICAL THERAPY LOWER EXTREMITY EVALUATION   Patient Name: [name] [name] MRN: [id number] DOB:[birth year], 70 y.o., male Today's Date: 07/06/2021   PT End of Session - 07/06/21 1221     Visit Number 1    Number of Visits 13    Date for PT Re-Evaluation 08/19/21    Authorization Type [employer] MCR    PT Start Time 0848    PT Stop Time 0935    PT Time Calculation (min) 47 min    Activity Tolerance Patient tolerated treatment well    Behavior During Therapy [facility] for tasks assessed/performed             Past Medical History:  Diagnosis Date   ALLERGIC RHINITIS 04/22/2007   Allergy    Cancer ([place])    HX SKIN CANCER   Complication of anesthesia    more concerned about positioning of head & neck because of TMJ   DDD (degenerative disc disease), cervical    GERD (gastroesophageal reflux disease)    infrequently   Headache(784.0)    migraines   History of kidney stones    History of skin cancer    History of TMJ disorder    HYPERCHOLESTEROLEMIA 04/22/2007   HYPERLIPIDEMIA 07/29/2007   Hyperplastic colon polyp    NEPHROLITHIASIS, HX OF 07/29/2007   Osteoarth NOS-Unspec 04/22/2007   Shortness of breath    Shoulder pain, acute    bilateral   Past Surgical History:  Procedure Laterality Date   APPENDECTOMY     BACK SURGERY  10/07/2015   COLONOSCOPY     10 yrs ago- 2 HPP    COLONOSCOPY  2019   JOINT REPLACEMENT     KNEE ARTHROSCOPY     right x2   SHOULDER SURGERY     right   skin cancer biopsy     L forehead   TOTAL KNEE ARTHROPLASTY Right 08/25/2013   Procedure: RIGHT TOTAL KNEE ARTHROPLASTY;  Surgeon: Gearlean Alf, MD;  Location: [facility];  Service: Orthopedics;  Laterality: Right;   TOTAL SHOULDER ARTHROPLASTY Left 12/25/2013   DR SUPPLE    TOTAL SHOULDER ARTHROPLASTY Left 12/25/2013   Procedure: LEFT TOTAL SHOULDER ARTHROPLASTY;  Surgeon: Marin Shutter, MD;  Location: [place];  Service: Orthopedics;  Laterality: Left;   TOTAL SHOULDER ARTHROPLASTY Right  06/18/2014   DR SUPPLE   TOTAL SHOULDER ARTHROPLASTY Right 06/18/2014   Procedure: RIGHT TOTAL SHOULDER ARTHROPLASTY;  Surgeon: Marin Shutter, MD;  Location: [place];  Service: Orthopedics;  Laterality: Right;   WISDOM TOOTH EXTRACTION     Patient Active Problem List   Diagnosis Date Noted   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   History of replacement of both shoulder joints 05/06/2020   History of spinal fusion 05/06/2020   History of stress fracture 05/06/2020   Chronic migraine without aura, with intractable migraine, so stated, with status migrainosus 12/02/2018   OSA (obstructive sleep apnea) 12/02/2018   Tinnitus 03/20/2018   Prediabetes 11/21/2017   Other fatigue 06/18/2017   Shortness of breath on exertion 06/18/2017   Other hyperlipidemia 06/18/2017   Vitamin D deficiency 06/18/2017   Hyperglycemia 06/18/2017   Class 1 obesity with serious comorbidity and body mass index (BMI) of 34.0 to 34.9 in adult 06/18/2017   Right lumbar radiculopathy 08/24/2014   Piriformis syndrome of right side 08/24/2014   S/P shoulder replacement 12/25/2013   Migraine headache 12/22/2013   OA (osteoarthritis) of knee 08/25/2013   HYPERLIPIDEMIA 07/29/2007  NEPHROLITHIASIS, HX OF 07/29/2007   HYPERCHOLESTEROLEMIA 04/22/2007   Allergic rhinitis 04/22/2007   Osteoarthritis 04/22/2007    PCP: [name], MD  REFERRING PROVIDER: Gregor Hams, MD  REFERRING DIAG: [phone] (ICD-10-CM) - Left hip pain   THERAPY DIAG:  Pain in left hip  ONSET DATE: around august  SUBJECTIVE:   SUBJECTIVE STATEMENT: I lost 60lb last year and then gained it all back since Aug of last year. I think inactivity contributed. Combo of meds and all contributed. 2014-16 I had 4 major operations- Rt TKA, Lt TSA, Rt TSA. Lt shoulder did really well but rt is not so great. 2016 had lumbar fusion and they said I need more. In 2017 I was having migraines up to about 20/mo. I am on an injection  for migraine treatment, can go 4-5 weeks without migraines but can get them 3-4 days in a row. I love to exercise, I used to do P90x daily and walked 7-8 miles/day. I stopped when I saw dr Georgina Snell because my groin has been hurting for about 5 mo and have been limping. Would limp for about the first 2 miles, be fine the rest and then limp around the rest of the night after taking a shower. Walking is like my therapy and is how I plan to lose weight. [facility] with [facility] Leafy Ro [facility]. Aug of 2021 I hit 189 lb and blood testing showed decr kidney activity. Had to cut my exercise from 5 hrs to 1 hour and drink 100oz of water. In the last 10 days I have done virtually nothing.  Have recumb bike, bowflex and row machine, bosu ball, free weights 6 and 9lb.   PERTINENT HISTORY: Bil TSA  PAIN:  Are you having pain? No VAS scale: 0/10 Pain location: Lt groin Pain orientation: Left  PAIN TYPE: none right now Pain description:  none right now   Aggravating factors: walking Relieving factors: rest  PRECAUTIONS: None  WEIGHT BEARING RESTRICTIONS No  FALLS:  Has patient fallen in last 6 months? No, Number of falls: 0  LIVING ENVIRONMENT: Lives with: lives with their family and lives with their spouse Lives in: House/apartment Stairs: Yes; 2 story home Has following equipment at home: None  OCCUPATION: retired  PLOF: Independent  PATIENT GOALS return to walking & P90X, weight loss   OBJECTIVE:   DIAGNOSTIC FINDINGS: History of lumbar fusion with degenerative changes lumbar spine. No significant hip DJD or fracture- per MD note   PATIENT SURVEYS:  LEFS 63/80  COGNITION:  Overall cognitive status: Within functional limits for tasks assessed     SENSATION:  Light touch: Appears intact     POSTURE:  Mild kyphosis, Rt hip elevation in standing, LLD- Rt longer  LE AROM/PROM:  A/PROM Right 07/06/2021 Left 07/06/2021  Hip flexion    Hip extension    Hip abduction     Hip adduction    Hip internal rotation    Hip external rotation    Knee flexion    Knee extension    Ankle dorsiflexion    Ankle plantarflexion    Ankle inversion    Ankle eversion     (Blank rows = not tested)  LE MMT:  MMT Right 07/06/2021 Left 07/06/2021  Hip flexion 4 4  Hip extension 4 5  Hip abduction 4 4  Hip adduction    Hip internal rotation    Hip external rotation    Knee flexion    Knee extension  Ankle dorsiflexion    Ankle plantarflexion    Ankle inversion    Ankle eversion     (Blank rows = not tested)     GAIT: Distance walked: 56ft for eval Assistive device utilized: None Level of assistance: Complete Independence Comments: Rt trendelenburg    TODAY'S TREATMENT: Heel lift in Left shoe- 2 layers SLS with hip abd activation- marching and walking with activation   PATIENT EDUCATION:  Education details: Anatomy of condition, POC, HEP, exercise form/rationale  Person educated: Patient Education method: Explanation, Demonstration, Tactile cues, Verbal cues, and Handouts Education comprehension: verbalized understanding, returned demonstration, verbal cues required, tactile cues required, and needs further education   HOME EXERCISE PROGRAM: Walking up to 2 miles multiple times/day with activation of hip abd  ASSESSMENT:  CLINICAL IMPRESSION: Patient is a 70 y.o. M who was seen today for physical therapy evaluation and treatment for Lt hip pain. Objective impairments include Abnormal gait, decreased activity tolerance, decreased balance, difficulty walking, decreased strength, impaired flexibility, obesity, and pain. These impairments are limiting patient from community activity and physical fitness . Personal factors including 3+ comorbidities: recent weight gain, h/o TKA to correct valgus, medical reasons limiting exercise tolerance  are also affecting patient's functional outcome. Patient will benefit from skilled PT to address above  impairments and improve overall function. Pt has had extensive surgical history but most notable is Rt TKA to correct genu varus- resulting is a LLD creating Rt trendelenburg in gait. Added 2 layer heel lift to Lt shoe and did not experience increase in Lt groin pain. Asked him to work on [employer] and marching with mirror to activate hip abductors and to do multiple, short walks in a day- stopping if groin pain onsets and to maintain a speed that allows him to focus on muscular activation through abductors.   REHAB POTENTIAL: Good  CLINICAL DECISION MAKING: Stable/uncomplicated  EVALUATION COMPLEXITY: Low   GOALS: Goals reviewed with patient? Yes  SHORT TERM GOALS:  STG Name Target Date Goal status  1 Pt will be able to walk 1 mile without incr in groin pain Baseline: immediate pain at eval 07/29/21  INITIAL  2 Able to demo SLS with level pelvis without cuing Baseline: verbal, tactile and visual cues provided at eval 07/29/21 INITIAL                            LONG TERM GOALS:   LTG Name Target Date Goal status  1 Gross hip strength to 5/5 Baseline:see chart in documentation 08/19/21 INITIAL  2 Pt will return to use of P90x as a form of exercise Baseline:avoiding at eval due to groin pain 08/19/21 INITIAL  3 Pt will be able to return to walking for exercise without limitation by groin pain Baseline: unable to particpate in walking program at eval 08/19/21 INITIAL  4 Pt will Improve LEFS by [place] Baseline:to be evaluated at [facility] 08/19/21 INITIAL                  PLAN: PT FREQUENCY: 1-2x/week  PT DURATION: 6 weeks  PLANNED INTERVENTIONS: Therapeutic exercises, Therapeutic activity, Neuro Muscular re-education, Balance training, Gait training, Patient/Family education, Joint mobilization, Stair training, Aquatic Therapy, Dry Needling, and Manual therapy  PLAN FOR NEXT SESSION: outcome of heel lift, revisit hip ab activation, OKC hip abd and core strength  Olaf Mesa C. Cortlin Marano PT,  DPT 07/06/21 12:42 PM   Referring diagnosis? Lt hip pain Treatment diagnosis? (if different  than referring diagnosis) Left hip pain What was this (referring dx) caused by? []  Surgery []  Fall []  Ongoing issue [x]  Arthritis []  Other: ____________  Laterality: []  Rt [x]  Lt []  Both  Check all possible CPT codes:      []  97110 (Therapeutic Exercise)  []  92507 (SLP Treatment)  []  97112 (Neuro Re-ed)   []  92526 (Swallowing Treatment)   []  97116 (Gait Training)   []  [id number] (Cognitive Training, 1st 15 minutes) []  97140 (Manual Therapy)   []  97130 (Cognitive Training, each add'l 15 minutes)  []  97530 (Therapeutic Activities)  []  Other, List CPT Code ____________    []  23343 (Self Care)       [x]  All codes above (97110 - 97535)  []  97012 (Mechanical Traction)  []  97014 (E-stim Unattended)  []  97032 (E-stim manual)  []  97033 (Ionto)  []  97035 (Ultrasound)  []  97760 (Orthotic Fit) []  97750 (Physical Performance Training) [x]  [id number] (Aquatic Therapy) []  97034 (Contrast Bath) []  [id number] (Paraffin) []  97597 (Wound Care 1st 20 sq cm) []  97598 (Wound Care each add'l 20 sq cm) []  97016 (Vasopneumatic Device) []  [id number] Comptroller) []  [id number] (Prosthetic Training)

## 2021-07-08 NOTE — Addendum Note (Signed)
Addended by: Jonna Coup on: 07/08/2021 08:54 AM   Modules accepted: Orders

## 2021-07-13 ENCOUNTER — Other Ambulatory Visit: Payer: Self-pay

## 2021-07-13 ENCOUNTER — Ambulatory Visit (HOSPITAL_BASED_OUTPATIENT_CLINIC_OR_DEPARTMENT_OTHER): Payer: Medicare HMO | Admitting: Physical Therapy

## 2021-07-13 ENCOUNTER — Encounter (HOSPITAL_BASED_OUTPATIENT_CLINIC_OR_DEPARTMENT_OTHER): Payer: Self-pay | Admitting: Physical Therapy

## 2021-07-13 DIAGNOSIS — M6281 Muscle weakness (generalized): Secondary | ICD-10-CM | POA: Diagnosis not present

## 2021-07-13 DIAGNOSIS — M25552 Pain in left hip: Secondary | ICD-10-CM | POA: Diagnosis not present

## 2021-07-13 DIAGNOSIS — R262 Difficulty in walking, not elsewhere classified: Secondary | ICD-10-CM | POA: Diagnosis not present

## 2021-07-13 DIAGNOSIS — Z981 Arthrodesis status: Secondary | ICD-10-CM | POA: Diagnosis not present

## 2021-07-13 NOTE — Therapy (Signed)
OUTPATIENT PHYSICAL THERAPY TREATMENT NOTE   Patient Name: [name] [name] MRN: [id number] DOB:09-03-1951, 70 y.o., male Today's Date: 07/13/2021  PCP: [name], MD REFERRING PROVIDER: [name], MD   PT End of Session - 07/13/21 1019     Visit Number 2    Number of Visits 13    Date for PT Re-Evaluation 08/19/21    Authorization Type [employer] MCR    PT Start Time 1019    PT Stop Time 1059    PT Time Calculation (min) 40 min    Activity Tolerance Patient tolerated treatment well    Behavior During Therapy WFL for tasks assessed/performed             Past Medical History:  Diagnosis Date   ALLERGIC RHINITIS 04/22/2007   Allergy    Cancer ([place])    HX SKIN CANCER   Complication of anesthesia    more concerned about positioning of head & neck because of TMJ   DDD (degenerative disc disease), cervical    GERD (gastroesophageal reflux disease)    infrequently   Headache(784.0)    migraines   History of kidney stones    History of skin cancer    History of TMJ disorder    HYPERCHOLESTEROLEMIA 04/22/2007   HYPERLIPIDEMIA 07/29/2007   Hyperplastic colon polyp    NEPHROLITHIASIS, HX OF 07/29/2007   Osteoarth NOS-Unspec 04/22/2007   Shortness of breath    Shoulder pain, acute    bilateral   Past Surgical History:  Procedure Laterality Date   APPENDECTOMY     BACK SURGERY  10/07/2015   COLONOSCOPY     10 yrs ago- 2 HPP    COLONOSCOPY  2019   JOINT REPLACEMENT     KNEE ARTHROSCOPY     right x2   SHOULDER SURGERY     right   skin cancer biopsy     L forehead   TOTAL KNEE ARTHROPLASTY Right 08/25/2013   Procedure: RIGHT TOTAL KNEE ARTHROPLASTY;  Surgeon: Gearlean Alf, MD;  Location: [facility];  Service: Orthopedics;  Laterality: Right;   TOTAL SHOULDER ARTHROPLASTY Left 12/25/2013   DR SUPPLE    TOTAL SHOULDER ARTHROPLASTY Left 12/25/2013   Procedure: LEFT TOTAL SHOULDER ARTHROPLASTY;  Surgeon: Marin Shutter, MD;  Location: [place];  Service: Orthopedics;   Laterality: Left;   TOTAL SHOULDER ARTHROPLASTY Right 06/18/2014   DR SUPPLE   TOTAL SHOULDER ARTHROPLASTY Right 06/18/2014   Procedure: RIGHT TOTAL SHOULDER ARTHROPLASTY;  Surgeon: Marin Shutter, MD;  Location: [place];  Service: Orthopedics;  Laterality: Right;   WISDOM TOOTH EXTRACTION     Patient Active Problem List   Diagnosis Date Noted   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   History of replacement of both shoulder joints 05/06/2020   History of spinal fusion 05/06/2020   History of stress fracture 05/06/2020   Chronic migraine without aura, with intractable migraine, so stated, with status migrainosus 12/02/2018   OSA (obstructive sleep apnea) 12/02/2018   Tinnitus 03/20/2018   Prediabetes 11/21/2017   Other fatigue 06/18/2017   Shortness of breath on exertion 06/18/2017   Other hyperlipidemia 06/18/2017   Vitamin D deficiency 06/18/2017   Hyperglycemia 06/18/2017   Class 1 obesity with serious comorbidity and body mass index (BMI) of 34.0 to 34.9 in adult 06/18/2017   Right lumbar radiculopathy 08/24/2014   Piriformis syndrome of right side 08/24/2014   S/P shoulder replacement 12/25/2013   Migraine headache 12/22/2013   OA (  osteoarthritis) of knee 08/25/2013   HYPERLIPIDEMIA 07/29/2007   NEPHROLITHIASIS, HX OF 07/29/2007   HYPERCHOLESTEROLEMIA 04/22/2007   Allergic rhinitis 04/22/2007   Osteoarthritis 04/22/2007    REFERRING DIAG: M25.552 (ICD-10-CM) - Left hip pain   THERAPY DIAG:  Pain in left hip  PERTINENT HISTORY: bil TSA  PRECAUTIONS: none  SUBJECTIVE: trying to level hips hurt my back so I just did the shorter walks. Focusing on not letting hip go to the side and did not feel like I was limping around. Did put the heel lifts in and in adjustment can be felt in Lt knee. Still feel the groin about half of what I am feeling.   PAIN:  Are you having pain? No VAS scale: 0/10 Pain location: Rt groin  Aggravating factors: long distance  walking Relieving factors: sit   OBJECTIVE:    DIAGNOSTIC FINDINGS: History of lumbar fusion with degenerative changes lumbar spine. No significant hip DJD or fracture- per MD note    PATIENT SURVEYS:  LEFS 63/80   COGNITION:          Overall cognitive status: Within functional limits for tasks assessed                        SENSATION:          Light touch: Appears intact               POSTURE:  Mild kyphosis, Rt hip elevation in standing, LLD- Rt longer   LE AROM/PROM:   A/PROM Right 07/06/2021 Left 07/06/2021  Hip flexion      Hip extension      Hip abduction      Hip adduction      Hip internal rotation      Hip external rotation      Knee flexion      Knee extension      Ankle dorsiflexion      Ankle plantarflexion      Ankle inversion      Ankle eversion       (Blank rows = not tested)   LE MMT:   MMT Right 07/06/2021 Left 07/06/2021  Hip flexion 4 4  Hip extension 4 5  Hip abduction 4 4  Hip adduction      Hip internal rotation      Hip external rotation      Knee flexion      Knee extension      Ankle dorsiflexion      Ankle plantarflexion      Ankle inversion      Ankle eversion       (Blank rows = not tested)         GAIT: Distance walked: 5ft for eval Assistive device utilized: None Level of assistance: Complete Independence Comments: Rt trendelenburg       TODAY'S TREATMENT: 10/19  Clam  SL hip abd & circles  Piriformis stretch hooklying  Transv abdominis with ball in hooklying- added UE flexion alt & bil moving together, marching  Manual: roller & STM to Rt post hip  EVAL  Heel lift in Left shoe- 2 layers SLS with hip abd activation- marching and walking with activation     PATIENT EDUCATION:  Education details: Anatomy of condition, POC, HEP, exercise form/rationale   Person educated: Patient Education method: Explanation, Demonstration, Tactile cues, Verbal cues, and Handouts Education comprehension: verbalized  understanding, returned demonstration, verbal cues required, tactile cues required, and needs further  education     HOME EXERCISE PROGRAM: Walking up to 2 miles multiple times/day with activation of hip abd [id number]   ASSESSMENT:   CLINICAL IMPRESSION: Worked on coordination in activation of hip abductor group and core for support and stability. Tightness reduced with manual therapy. Asked that he continue with shorter walks and begin adding in these OKC exercises, staying out of painful ranges for hip.  REHAB POTENTIAL: Good   CLINICAL DECISION MAKING: Stable/uncomplicated   EVALUATION COMPLEXITY: Low     GOALS: Goals reviewed with patient? Yes   SHORT TERM GOALS:   STG Name Target Date Goal status  1 Pt will be able to walk 1 mile without incr in groin pain Baseline: immediate pain at eval 07/29/21   INITIAL  2 Able to demo SLS with level pelvis without cuing Baseline: verbal, tactile and visual cues provided at eval 07/29/21 INITIAL                                                 LONG TERM GOALS:    LTG Name Target Date Goal status  1 Gross hip strength to 5/5 Baseline:see chart in documentation 08/19/21 INITIAL  2 Pt will return to use of P90x as a form of exercise Baseline:avoiding at eval due to groin pain 08/19/21 INITIAL  3 Pt will be able to return to walking for exercise without limitation by groin pain Baseline: unable to particpate in walking program at eval 08/19/21 INITIAL  4 Pt will Improve LEFS by [place] Baseline:to be evaluated at PN 08/19/21 INITIAL                               PLAN: PT FREQUENCY: 1-2x/week   PT DURATION: 6 weeks   PLANNED INTERVENTIONS: Therapeutic exercises, Therapeutic activity, Neuro Muscular re-education, Balance training, Gait training, Patient/Family education, Joint mobilization, Stair training, Aquatic Therapy, Dry Needling, and Manual therapy   PLAN FOR NEXT SESSION: progress hip abd and core strength     Maanasa Aderhold C.  Greyson Peavy PT, DPT 07/13/21 8:53 PM

## 2021-07-15 ENCOUNTER — Other Ambulatory Visit: Payer: Self-pay

## 2021-07-15 ENCOUNTER — Encounter (HOSPITAL_BASED_OUTPATIENT_CLINIC_OR_DEPARTMENT_OTHER): Payer: Self-pay | Admitting: Physical Therapy

## 2021-07-15 ENCOUNTER — Ambulatory Visit (HOSPITAL_BASED_OUTPATIENT_CLINIC_OR_DEPARTMENT_OTHER): Payer: Medicare HMO | Admitting: Physical Therapy

## 2021-07-15 DIAGNOSIS — M25552 Pain in left hip: Secondary | ICD-10-CM | POA: Diagnosis not present

## 2021-07-15 DIAGNOSIS — Z981 Arthrodesis status: Secondary | ICD-10-CM | POA: Diagnosis not present

## 2021-07-15 DIAGNOSIS — M6281 Muscle weakness (generalized): Secondary | ICD-10-CM | POA: Diagnosis not present

## 2021-07-15 DIAGNOSIS — R262 Difficulty in walking, not elsewhere classified: Secondary | ICD-10-CM | POA: Diagnosis not present

## 2021-07-15 NOTE — Therapy (Signed)
OUTPATIENT PHYSICAL THERAPY TREATMENT NOTE   Patient Name: [name] [name] MRN: [id number] DOB:[birth year], 70 y.o., male Today's Date: 07/16/2021  PCP: [name], MD REFERRING PROVIDER: [name], MD   PT End of Session - 07/15/21 0847     Visit Number 3    Number of Visits 13    Date for PT Re-Evaluation 08/19/21    Authorization Type [employer] MCR    PT Start Time 0845    PT Stop Time 0929    PT Time Calculation (min) 44 min    Activity Tolerance Patient tolerated treatment well    Behavior During Therapy [facility] for tasks assessed/performed             Past Medical History:  Diagnosis Date   ALLERGIC RHINITIS 04/22/2007   Allergy    Cancer ([place])    HX SKIN CANCER   Complication of anesthesia    more concerned about positioning of head & neck because of TMJ   DDD (degenerative disc disease), cervical    GERD (gastroesophageal reflux disease)    infrequently   Headache(784.0)    migraines   History of kidney stones    History of skin cancer    History of TMJ disorder    HYPERCHOLESTEROLEMIA 04/22/2007   HYPERLIPIDEMIA 07/29/2007   Hyperplastic colon polyp    NEPHROLITHIASIS, HX OF 07/29/2007   Osteoarth NOS-Unspec 04/22/2007   Shortness of breath    Shoulder pain, acute    bilateral   Past Surgical History:  Procedure Laterality Date   APPENDECTOMY     BACK SURGERY  10/07/2015   COLONOSCOPY     10 yrs ago- 2 HPP    COLONOSCOPY  2019   JOINT REPLACEMENT     KNEE ARTHROSCOPY     right x2   SHOULDER SURGERY     right   skin cancer biopsy     L forehead   TOTAL KNEE ARTHROPLASTY Right 08/25/2013   Procedure: RIGHT TOTAL KNEE ARTHROPLASTY;  Surgeon: Gearlean Alf, MD;  Location: [facility];  Service: Orthopedics;  Laterality: Right;   TOTAL SHOULDER ARTHROPLASTY Left 12/25/2013   DR SUPPLE    TOTAL SHOULDER ARTHROPLASTY Left 12/25/2013   Procedure: LEFT TOTAL SHOULDER ARTHROPLASTY;  Surgeon: Marin Shutter, MD;  Location: [place];  Service: Orthopedics;   Laterality: Left;   TOTAL SHOULDER ARTHROPLASTY Right 06/18/2014   DR SUPPLE   TOTAL SHOULDER ARTHROPLASTY Right 06/18/2014   Procedure: RIGHT TOTAL SHOULDER ARTHROPLASTY;  Surgeon: Marin Shutter, MD;  Location: [place];  Service: Orthopedics;  Laterality: Right;   WISDOM TOOTH EXTRACTION     Patient Active Problem List   Diagnosis Date Noted   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   History of replacement of both shoulder joints 05/06/2020   History of spinal fusion 05/06/2020   History of stress fracture 05/06/2020   Chronic migraine without aura, with intractable migraine, so stated, with status migrainosus 12/02/2018   OSA (obstructive sleep apnea) 12/02/2018   Tinnitus 03/20/2018   Prediabetes 11/21/2017   Other fatigue 06/18/2017   Shortness of breath on exertion 06/18/2017   Other hyperlipidemia 06/18/2017   Vitamin D deficiency 06/18/2017   Hyperglycemia 06/18/2017   Class 1 obesity with serious comorbidity and body mass index (BMI) of 34.0 to 34.9 in adult 06/18/2017   Right lumbar radiculopathy 08/24/2014   Piriformis syndrome of right side 08/24/2014   S/P shoulder replacement 12/25/2013   Migraine headache 12/22/2013   OA (  osteoarthritis) of knee 08/25/2013   HYPERLIPIDEMIA 07/29/2007   NEPHROLITHIASIS, HX OF 07/29/2007   HYPERCHOLESTEROLEMIA 04/22/2007   Allergic rhinitis 04/22/2007   Osteoarthritis 04/22/2007    REFERRING DIAG: M25.552 (ICD-10-CM) - Left hip pain   THERAPY DIAG: Pain in left hip  PERTINENT HISTORY: bil TSA  PRECAUTIONS: none  SUBJECTIVE: walking about 20 min miles. Had some groin pain at about 2 miles but got better after I slowed down. Sensation in Lt knee went away. Feeling discomfort in Rt hip similar to before I had to have back surgery.   PAIN:  Are you having pain? No VAS scale: 0/10 Pain location: Rt groin  Aggravating factors: long distance walking Relieving factors: sit   OBJECTIVE:    DIAGNOSTIC  FINDINGS: History of lumbar fusion with degenerative changes lumbar spine. No significant hip DJD or fracture- per MD note    PATIENT SURVEYS:  LEFS 63/80   COGNITION:          Overall cognitive status: Within functional limits for tasks assessed                        SENSATION:          Light touch: Appears intact               POSTURE:  Mild kyphosis, Rt hip elevation in standing, LLD- Rt longer   LE AROM/PROM:   A/PROM Right 07/06/2021 Left 07/06/2021  Hip flexion      Hip extension      Hip abduction      Hip adduction      Hip internal rotation      Hip external rotation      Knee flexion      Knee extension      Ankle dorsiflexion      Ankle plantarflexion      Ankle inversion      Ankle eversion       (Blank rows = not tested)   LE MMT:   MMT Right 07/06/2021 Left 07/06/2021  Hip flexion 4 4  Hip extension 4 5  Hip abduction 4 4  Hip adduction      Hip internal rotation      Hip external rotation      Knee flexion      Knee extension      Ankle dorsiflexion      Ankle plantarflexion      Ankle inversion      Ankle eversion       (Blank rows = not tested)         GAIT: 10/21- resolved trendelenburg and pt has good awareness.      TODAY'S TREATMENT: 10/21  Manual: STM and trigger point release in Rt piriformis glut med/min  Supine piriformis stretch- pull and press  Qped on elbows- core engagement, alt hip ext  Standing: SLs with level pelvis in mirror; HS, gastroc, add, hip flexor stretching  10/19  Clam  SL hip abd & circles  Piriformis stretch hooklying  Transv abdominis with ball in hooklying- added UE flexion alt & bil moving together, marching  Manual: roller & STM to Rt post hip  EVAL  Heel lift in Left shoe- 2 layers SLS with hip abd activation- marching and walking with activation     PATIENT EDUCATION:  Education details: Anatomy of condition, POC, HEP, exercise form/rationale   Person educated: Patient Education  method: Explanation, Demonstration, Tactile cues, Verbal cues, and Handouts  Education comprehension: verbalized understanding, returned demonstration, verbal cues required, tactile cues required, and needs further education     HOME EXERCISE PROGRAM: Walking up to 2 miles multiple times/day with activation of hip abd [id number]   ASSESSMENT:   CLINICAL IMPRESSION: Requires cues for form and encouragement to reduce effort placed into exercises. Pt has always been very active and wants to increase quickly when he feels good so I continue to encourage reduced intensity for healing when he feels discomfort. Excellent gait pattern presented and pt is able to recognize when he performs a trendelenburg gait. Still wearing heel lift with resolution of knee discomfort. Tightness noted in hip that created concordant pain so I am not worried about spinal level involvement at this time but will continue to monitor. Pt denied use of DN for hip musculature due to history with episode of DN in cervical region for HA.  REHAB POTENTIAL: Good   CLINICAL DECISION MAKING: Stable/uncomplicated   EVALUATION COMPLEXITY: Low     GOALS: Goals reviewed with patient? Yes   SHORT TERM GOALS:   STG Name Target Date Goal status  1 Pt will be able to walk 1 mile without incr in groin pain Baseline: immediate pain at eval 07/29/21   INITIAL  2 Able to demo SLS with level pelvis without cuing Baseline: verbal, tactile and visual cues provided at eval 07/29/21 INITIAL                                                 LONG TERM GOALS:    LTG Name Target Date Goal status  1 Gross hip strength to 5/5 Baseline:see chart in documentation 08/19/21 INITIAL  2 Pt will return to use of P90x as a form of exercise Baseline:avoiding at eval due to groin pain 08/19/21 INITIAL  3 Pt will be able to return to walking for exercise without limitation by groin pain Baseline: unable to particpate in walking program at eval  08/19/21 INITIAL  4 Pt will Improve LEFS by [place] Baseline:to be evaluated at [facility] 08/19/21 INITIAL                               PLAN: PT FREQUENCY: 1-2x/week   PT DURATION: 6 weeks   PLANNED INTERVENTIONS: Therapeutic exercises, Therapeutic activity, Neuro Muscular re-education, Balance training, Gait training, Patient/Family education, Joint mobilization, Stair training, Aquatic Therapy, Dry Needling, and Manual therapy   PLAN FOR NEXT SESSION: progress hip abd and core strength    Tatanisha Cuthbert C. Helmut Hennon PT, DPT 07/16/21 11:59 AM

## 2021-07-17 NOTE — Therapy (Signed)
OUTPATIENT PHYSICAL THERAPY TREATMENT NOTE   Patient Name: [name] [name] MRN: [id number] DOB:May 29, 1951, 70 y.o., male Today's Date: 07/18/2021  PCP: [name], MD REFERRING PROVIDER: [name], MD   PT End of Session - 07/18/21 0930     Visit Number 4    Number of Visits 13    Date for PT Re-Evaluation 08/19/21    Authorization Type [employer] MCR    PT Start Time 0930    PT Stop Time 1010    PT Time Calculation (min) 40 min    Activity Tolerance Patient tolerated treatment well    Behavior During Therapy WFL for tasks assessed/performed              Past Medical History:  Diagnosis Date   ALLERGIC RHINITIS 04/22/2007   Allergy    Cancer ([place])    HX SKIN CANCER   Complication of anesthesia    more concerned about positioning of head & neck because of TMJ   DDD (degenerative disc disease), cervical    GERD (gastroesophageal reflux disease)    infrequently   Headache(784.0)    migraines   History of kidney stones    History of skin cancer    History of TMJ disorder    HYPERCHOLESTEROLEMIA 04/22/2007   HYPERLIPIDEMIA 07/29/2007   Hyperplastic colon polyp    NEPHROLITHIASIS, HX OF 07/29/2007   Osteoarth NOS-Unspec 04/22/2007   Shortness of breath    Shoulder pain, acute    bilateral   Past Surgical History:  Procedure Laterality Date   APPENDECTOMY     BACK SURGERY  10/07/2015   COLONOSCOPY     10 yrs ago- 2 HPP    COLONOSCOPY  2019   JOINT REPLACEMENT     KNEE ARTHROSCOPY     right x2   SHOULDER SURGERY     right   skin cancer biopsy     L forehead   TOTAL KNEE ARTHROPLASTY Right 08/25/2013   Procedure: RIGHT TOTAL KNEE ARTHROPLASTY;  Surgeon: Gearlean Alf, MD;  Location: [facility];  Service: Orthopedics;  Laterality: Right;   TOTAL SHOULDER ARTHROPLASTY Left 12/25/2013   DR SUPPLE    TOTAL SHOULDER ARTHROPLASTY Left 12/25/2013   Procedure: LEFT TOTAL SHOULDER ARTHROPLASTY;  Surgeon: Marin Shutter, MD;  Location: [place];  Service: Orthopedics;   Laterality: Left;   TOTAL SHOULDER ARTHROPLASTY Right 06/18/2014   DR SUPPLE   TOTAL SHOULDER ARTHROPLASTY Right 06/18/2014   Procedure: RIGHT TOTAL SHOULDER ARTHROPLASTY;  Surgeon: Marin Shutter, MD;  Location: [place];  Service: Orthopedics;  Laterality: Right;   WISDOM TOOTH EXTRACTION     Patient Active Problem List   Diagnosis Date Noted   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   History of replacement of both shoulder joints 05/06/2020   History of spinal fusion 05/06/2020   History of stress fracture 05/06/2020   Chronic migraine without aura, with intractable migraine, so stated, with status migrainosus 12/02/2018   OSA (obstructive sleep apnea) 12/02/2018   Tinnitus 03/20/2018   Prediabetes 11/21/2017   Other fatigue 06/18/2017   Shortness of breath on exertion 06/18/2017   Other hyperlipidemia 06/18/2017   Vitamin D deficiency 06/18/2017   Hyperglycemia 06/18/2017   Class 1 obesity with serious comorbidity and body mass index (BMI) of 34.0 to 34.9 in adult 06/18/2017   Right lumbar radiculopathy 08/24/2014   Piriformis syndrome of right side 08/24/2014   S/P shoulder replacement 12/25/2013   Migraine headache 12/22/2013  OA (osteoarthritis) of knee 08/25/2013   HYPERLIPIDEMIA 07/29/2007   NEPHROLITHIASIS, HX OF 07/29/2007   HYPERCHOLESTEROLEMIA 04/22/2007   Allergic rhinitis 04/22/2007   Osteoarthritis 04/22/2007    REFERRING DIAG: M25.552 (ICD-10-CM) - Left hip pain   THERAPY DIAG: Pain in left hip  PERTINENT HISTORY: bil TSA  PRECAUTIONS: none  SUBJECTIVE: Migraine after trigger point release. Doing my exercises and they are hard but good. I walked 8 miles on sat, 20 min avg, wore lift and did not have groin pain or Rt hip pain. Rode bike yesterday rather than walking.   PAIN:  Are you having pain? No VAS scale: 0/10 Pain location: Rt groin  Aggravating factors: long distance walking Relieving factors: sit   OBJECTIVE:    DIAGNOSTIC  FINDINGS: History of lumbar fusion with degenerative changes lumbar spine. No significant hip DJD or fracture- per MD note    PATIENT SURVEYS:  LEFS 63/80   COGNITION:          Overall cognitive status: Within functional limits for tasks assessed                        SENSATION:          Light touch: Appears intact               POSTURE:  Mild kyphosis, Rt hip elevation in standing, LLD- Rt longer   LE AROM/PROM:   A/PROM Right 07/06/2021 Left 07/06/2021  Hip flexion      Hip extension      Hip abduction      Hip adduction      Hip internal rotation      Hip external rotation      Knee flexion      Knee extension      Ankle dorsiflexion      Ankle plantarflexion      Ankle inversion      Ankle eversion       (Blank rows = not tested)   LE MMT:   MMT Right 07/06/2021 Left 07/06/2021  Hip flexion 4 4  Hip extension 4 5  Hip abduction 4 4  Hip adduction      Hip internal rotation      Hip external rotation      Knee flexion      Knee extension      Ankle dorsiflexion      Ankle plantarflexion      Ankle inversion      Ankle eversion       (Blank rows = not tested)         GAIT: 10/24: trendelenburg with incr speed     TODAY'S TREATMENT: 10/24  Gait on track- review & speed challenges  Seated HSS, piriformis  Dead bug extension from hooklying & from 90/90  90/90 SL lift with alt knee/heel taps  10/21  Manual: STM and trigger point release in Rt piriformis glut med/min  Supine piriformis stretch- pull and press  Qped on elbows- core engagement, alt hip ext  Standing: SLs with level pelvis in mirror; HS, gastroc, add, hip flexor stretching  10/19  Clam  SL hip abd & circles  Piriformis stretch hooklying  Transv abdominis with ball in hooklying- added UE flexion alt & bil moving together, marching  Manual: roller & STM to Rt post hip  EVAL  Heel lift in Left shoe- 2 layers SLS with hip abd activation- marching and walking with activation  PATIENT EDUCATION:  Education details: HEP, exercise form/rationale   Person educated: Patient Education method: Explanation, Demonstration, Tactile cues, Verbal cues, and Handouts Education comprehension: verbalized understanding, returned demonstration, verbal cues required, tactile cues required, and needs further education     HOME EXERCISE PROGRAM: Walking up to 2 miles multiple times/day with activation of hip abd [id number]   ASSESSMENT:   CLINICAL IMPRESSION: Chose to avoid manual therapy to hip due to onset of HA following last session. Lack of hip control with increased speed which was worked on today around track- advised him to try bursts of incr speed and slowing down for a bit to regain form when he loses control. Grouped exercises- hip abd, core, walking stretches to decrease feeling of being overwhelmed with time for exercises.    REHAB POTENTIAL: Good   CLINICAL DECISION MAKING: Stable/uncomplicated   EVALUATION COMPLEXITY: Low     GOALS: Goals reviewed with patient? Yes   SHORT TERM GOALS:   STG Name Target Date Goal status  1 Pt will be able to walk 1 mile without incr in groin pain Baseline: immediate pain at eval 07/29/21   INITIAL  2 Able to demo SLS with level pelvis without cuing Baseline: verbal, tactile and visual cues provided at eval 07/29/21 INITIAL                                                 LONG TERM GOALS:    LTG Name Target Date Goal status  1 Gross hip strength to 5/5 Baseline:see chart in documentation 08/19/21 INITIAL  2 Pt will return to use of P90x as a form of exercise Baseline:avoiding at eval due to groin pain 08/19/21 INITIAL  3 Pt will be able to return to walking for exercise without limitation by groin pain Baseline: unable to particpate in walking program at eval 08/19/21 INITIAL  4 Pt will Improve LEFS by [place] Baseline:to be evaluated at PN 08/19/21 INITIAL                               PLAN: PT FREQUENCY:  1-2x/week   PT DURATION: 6 weeks   PLANNED INTERVENTIONS: Therapeutic exercises, Therapeutic activity, Neuro Muscular re-education, Balance training, Gait training, Patient/Family education, Joint mobilization, Stair training, Aquatic Therapy, Dry Needling, and Manual therapy   PLAN FOR NEXT SESSION: recheck hip with incr speed    [name] Levi C. Devetta Hagenow PT, DPT 07/18/21 10:12 AM

## 2021-07-18 ENCOUNTER — Encounter (HOSPITAL_BASED_OUTPATIENT_CLINIC_OR_DEPARTMENT_OTHER): Payer: Medicare HMO | Admitting: Physical Therapy

## 2021-07-18 ENCOUNTER — Encounter (HOSPITAL_BASED_OUTPATIENT_CLINIC_OR_DEPARTMENT_OTHER): Payer: Self-pay | Admitting: Physical Therapy

## 2021-07-18 ENCOUNTER — Other Ambulatory Visit: Payer: Self-pay

## 2021-07-18 DIAGNOSIS — R262 Difficulty in walking, not elsewhere classified: Secondary | ICD-10-CM | POA: Diagnosis not present

## 2021-07-18 DIAGNOSIS — M25552 Pain in left hip: Secondary | ICD-10-CM | POA: Insufficient documentation

## 2021-07-18 DIAGNOSIS — Z981 Arthrodesis status: Secondary | ICD-10-CM | POA: Diagnosis not present

## 2021-07-18 DIAGNOSIS — M6281 Muscle weakness (generalized): Secondary | ICD-10-CM | POA: Diagnosis not present

## 2021-07-19 ENCOUNTER — Telehealth (INDEPENDENT_AMBULATORY_CARE_PROVIDER_SITE_OTHER): Payer: Self-pay | Admitting: Emergency Medicine

## 2021-07-19 ENCOUNTER — Encounter (INDEPENDENT_AMBULATORY_CARE_PROVIDER_SITE_OTHER): Payer: Self-pay | Admitting: Family Medicine

## 2021-07-19 ENCOUNTER — Ambulatory Visit (INDEPENDENT_AMBULATORY_CARE_PROVIDER_SITE_OTHER): Payer: Medicare HMO | Admitting: Family Medicine

## 2021-07-19 VITALS — BP 122/75 | HR 72 | Temp 97.7°F | Ht 69.0 in | Wt 238.0 lb

## 2021-07-19 DIAGNOSIS — E669 Obesity, unspecified: Secondary | ICD-10-CM

## 2021-07-19 DIAGNOSIS — Z6833 Body mass index (BMI) 33.0-33.9, adult: Secondary | ICD-10-CM

## 2021-07-19 DIAGNOSIS — R7303 Prediabetes: Secondary | ICD-10-CM

## 2021-07-19 DIAGNOSIS — M25559 Pain in unspecified hip: Secondary | ICD-10-CM | POA: Diagnosis not present

## 2021-07-19 NOTE — Progress Notes (Signed)
Chief Complaint:   OBESITY [name] [name] is here to discuss his progress with his obesity treatment plan along with follow-up of his obesity related diagnoses. [name] [name] is on the Category 3 Plan and states he is following his eating plan approximately 50% of the time. [name] [name] states he is walking for 90 minutes 7 times per week.  Today's visit was #: 62 Starting weight: 230 lbs Starting date: 06/18/2017 Today's weight: 238 lbs Today's date: 07/19/2021 Total lbs lost to date: 0 Total lbs lost since last in-office visit: 0  Interim History: Hall hasn't been able to concentrate on weight loss as much. He is working on physical therapy to help with pain and how to maximize proper exercise for him.  Subjective:   1. Pre-diabetes [name] [name] was prescribed Ozempic and he is working on getting patient assistance from the company. He is working on diet and exercise.  2. Hip pain [name] [name] saw Dr. Georgina Snell and he is doing physical therapy to help with his pain. He feels he is improving.  Assessment/Plan:   1. Pre-diabetes [name] [name] and I signed the form today and paperwork will be faxed. He is to continue with his diet and exercise, and we will follow up at his next visit.  2. Hip pain [name] [name] will continue with physical therapy, and will continue with diet and weight loss.  3. Obesity with current BMI 35.2 [name] [name] is currently in the action stage of change. As such, his goal is to continue with weight loss efforts. He has agreed to the Category 3 Plan.   Exercise goals: As is.  Behavioral modification strategies: increasing lean protein intake and no skipping meals.  [name] [name] has agreed to follow-up with our clinic in 4 to 5 weeks. He was informed of the importance of frequent follow-up visits to maximize his success with intensive lifestyle modifications for his multiple health conditions.   Objective:   Blood pressure 122/75, pulse 72, temperature 97.7 F (36.5 C), height 5\' 9"  (1.753 m), weight 238 lb (108  kg), SpO2 99 %. Body mass index is 35.15 kg/m.  General: Cooperative, alert, well developed, in no acute distress. HEENT: Conjunctivae and lids unremarkable. Cardiovascular: Regular rhythm.  Lungs: Normal work of breathing. Neurologic: No focal deficits.   Lab Results  Component Value Date   CREATININE 1.24 04/18/2021   BUN 24 (H) 04/18/2021   NA 137 04/18/2021   K 4.5 04/18/2021   CL 103 04/18/2021   CO2 27 04/18/2021   Lab Results  Component Value Date   ALT 34 04/18/2021   AST 25 04/18/2021   ALKPHOS 43 04/18/2021   BILITOT 0.4 04/18/2021   Lab Results  Component Value Date   HGBA1C 5.3 03/15/2020   HGBA1C 5.7 (H) 06/19/2019   HGBA1C 5.5 11/21/2017   HGBA1C 5.8 (H) 06/18/2017   Lab Results  Component Value Date   INSULIN 5.0 03/15/2020   INSULIN 8.5 06/19/2019   INSULIN 8.6 11/21/2017   INSULIN 16.5 06/18/2017   Lab Results  Component Value Date   TSH 2.80 04/18/2021   Lab Results  Component Value Date   CHOL 250 (H) 06/09/2020   HDL 93 06/09/2020   LDLCALC 142 (H) 06/09/2020   LDLDIRECT 135.0 07/16/2013   TRIG 59 06/09/2020   CHOLHDL 2.7 06/09/2020   Lab Results  Component Value Date   VD25OH 89.1 03/15/2020   VD25OH 78.1 06/19/2019   VD25OH 59.6 11/21/2017   Lab Results  Component Value Date   WBC 4.6 04/18/2021  HGB 15.3 04/18/2021   HCT 44.5 04/18/2021   MCV 96.2 04/18/2021   PLT 225.0 04/18/2021   No results found for: IRON, TIBC, FERRITIN  Attestation Statements:   Reviewed by clinician on day of visit: allergies, medications, problem list, medical history, surgical history, family history, social history, and previous encounter notes.  Time spent on visit including pre-visit chart review and post-visit care and charting was 43 minutes.    I, Trixie Dredge, am acting as transcriptionist for Dennard Nip, MD.  I have reviewed the above documentation for accuracy and completeness, and I agree with the above. -  Dennard Nip,  MD

## 2021-07-19 NOTE — Telephone Encounter (Signed)
[employer] patient assistance program. Shari Heritage has been signed and faxed to [employer]. Copy has also been sent to be scan to patients chart

## 2021-07-24 NOTE — Therapy (Signed)
OUTPATIENT PHYSICAL THERAPY TREATMENT NOTE   Patient Name: [name] [name] MRN: [id number] DOB:1951/08/02, 70 y.o., male Today's Date: 07/25/2021  PCP: [name], MD REFERRING PROVIDER: [name], MD   PT End of Session - 07/25/21 0847     Visit Number 5    Number of Visits 13    Date for PT Re-Evaluation 08/19/21    Authorization Type [employer] MCR    PT Start Time 0845    PT Stop Time 0926    PT Time Calculation (min) 41 min    Activity Tolerance Patient tolerated treatment well    Behavior During Therapy [facility] for tasks assessed/performed               Past Medical History:  Diagnosis Date   ALLERGIC RHINITIS 04/22/2007   Allergy    Cancer ([place])    HX SKIN CANCER   Complication of anesthesia    more concerned about positioning of head & neck because of TMJ   DDD (degenerative disc disease), cervical    GERD (gastroesophageal reflux disease)    infrequently   Headache(784.0)    migraines   History of kidney stones    History of skin cancer    History of TMJ disorder    HYPERCHOLESTEROLEMIA 04/22/2007   HYPERLIPIDEMIA 07/29/2007   Hyperplastic colon polyp    NEPHROLITHIASIS, HX OF 07/29/2007   Osteoarth NOS-Unspec 04/22/2007   Shortness of breath    Shoulder pain, acute    bilateral   Past Surgical History:  Procedure Laterality Date   APPENDECTOMY     BACK SURGERY  10/07/2015   COLONOSCOPY     10 yrs ago- 2 HPP    COLONOSCOPY  2019   JOINT REPLACEMENT     KNEE ARTHROSCOPY     right x2   SHOULDER SURGERY     right   skin cancer biopsy     L forehead   TOTAL KNEE ARTHROPLASTY Right 08/25/2013   Procedure: RIGHT TOTAL KNEE ARTHROPLASTY;  Surgeon: Gearlean Alf, MD;  Location: [facility];  Service: Orthopedics;  Laterality: Right;   TOTAL SHOULDER ARTHROPLASTY Left 12/25/2013   DR SUPPLE    TOTAL SHOULDER ARTHROPLASTY Left 12/25/2013   Procedure: LEFT TOTAL SHOULDER ARTHROPLASTY;  Surgeon: Marin Shutter, MD;  Location: [place];  Service: Orthopedics;   Laterality: Left;   TOTAL SHOULDER ARTHROPLASTY Right 06/18/2014   DR SUPPLE   TOTAL SHOULDER ARTHROPLASTY Right 06/18/2014   Procedure: RIGHT TOTAL SHOULDER ARTHROPLASTY;  Surgeon: Marin Shutter, MD;  Location: [place];  Service: Orthopedics;  Laterality: Right;   WISDOM TOOTH EXTRACTION     Patient Active Problem List   Diagnosis Date Noted   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   History of replacement of both shoulder joints 05/06/2020   History of spinal fusion 05/06/2020   History of stress fracture 05/06/2020   Chronic migraine without aura, with intractable migraine, so stated, with status migrainosus 12/02/2018   OSA (obstructive sleep apnea) 12/02/2018   Tinnitus 03/20/2018   Prediabetes 11/21/2017   Other fatigue 06/18/2017   Shortness of breath on exertion 06/18/2017   Other hyperlipidemia 06/18/2017   Vitamin D deficiency 06/18/2017   Hyperglycemia 06/18/2017   Class 1 obesity with serious comorbidity and body mass index (BMI) of 34.0 to 34.9 in adult 06/18/2017   Right lumbar radiculopathy 08/24/2014   Piriformis syndrome of right side 08/24/2014   S/P shoulder replacement 12/25/2013   Migraine headache 12/22/2013  OA (osteoarthritis) of knee 08/25/2013   HYPERLIPIDEMIA 07/29/2007   NEPHROLITHIASIS, HX OF 07/29/2007   HYPERCHOLESTEROLEMIA 04/22/2007   Allergic rhinitis 04/22/2007   Osteoarthritis 04/22/2007    REFERRING DIAG: M25.552 (ICD-10-CM) - Left hip pain   THERAPY DIAG: Pain in left hip  PERTINENT HISTORY: bil TSA  PRECAUTIONS: none  SUBJECTIVE: I took longer strides and cut 2 min off of my walk- it was easier to go up hills. Did 5 miles each day. Exercises are hard and brought on back pain. No limping after. The sidelying circles bother me. I did the P90X boxing this weekend. I had 3 migraine days last week. They are always ignited by an exercise that gets my back involved- the tension starts in my lower back and moves up to neck and  results in HA.   PAIN:  Are you having pain? No VAS scale: 0/10 Pain location: Rt groin  Aggravating factors: long distance walking Relieving factors: sit   OBJECTIVE:    DIAGNOSTIC FINDINGS: History of lumbar fusion with degenerative changes lumbar spine. No significant hip DJD or fracture- per MD note    PATIENT SURVEYS:  LEFS 63/80- EVAL;    COGNITION:          Overall cognitive status: Within functional limits for tasks assessed                        SENSATION:          Light touch: Appears intact               POSTURE:  Mild kyphosis, Rt hip elevation in standing, LLD- Rt longer   LE AROM/PROM:   A/PROM Right 07/06/2021 Left 07/06/2021  Hip flexion      Hip extension      Hip abduction      Hip adduction      Hip internal rotation      Hip external rotation      Knee flexion      Knee extension      Ankle dorsiflexion      Ankle plantarflexion      Ankle inversion      Ankle eversion       (Blank rows = not tested)   LE MMT:   MMT Right 07/06/2021 Left 07/06/2021 Bilateral 10/31  Hip flexion 4 4 5   Hip extension 4 5   Hip abduction 4 4 5   Hip adduction       Hip internal rotation       Hip external rotation       Knee flexion       Knee extension       Ankle dorsiflexion       Ankle plantarflexion       Ankle inversion       Ankle eversion        (Blank rows = not tested)    mild trunk rotation in SLR MMT for lack of core engagement 10/31     GAIT: 10/24: trendelenburg with incr speed     TODAY'S TREATMENT: 10/31  Sidelying hip flexion/ext & Sidelying bicycle motion; supine piriformis stretch after each side  Supine HS stretch with strap- midline & lateral bias  SKTC with deep breathing  Walking evaluation for pattern  Qped with rocking+ core engagement; added alt hip ext- leg off edge of table to engage core from hip flexed position  10/24  Gait on track- review & speed challenges  Seated HSS, piriformis  Dead bug extension  from hooklying & from 90/90  90/90 SL lift with alt knee/heel taps  10/21  Manual: STM and trigger point release in Rt piriformis glut med/min  Supine piriformis stretch- pull and press  Qped on elbows- core engagement, alt hip ext  Standing: SLs with level pelvis in mirror; HS, gastroc, add, hip flexor stretching  10/19  Clam  SL hip abd & circles  Piriformis stretch hooklying  Transv abdominis with ball in hooklying- added UE flexion alt & bil moving together, marching  Manual: roller & STM to Rt post hip  EVAL  Heel lift in Left shoe- 2 layers SLS with hip abd activation- marching and walking with activation     PATIENT EDUCATION:  Education details: HEP, exercise form/rationale   Person educated: Patient Education method: Explanation, Demonstration, Tactile cues, Verbal cues, and Handouts Education comprehension: verbalized understanding, returned demonstration, verbal cues required, tactile cues required, and needs further education     HOME EXERCISE PROGRAM: Walking up to 2 miles multiple times/day with activation of hip abd [id number]   ASSESSMENT:   CLINICAL IMPRESSION: Improved strength in MMT with noted rotation in supine SLR- decreased when he was cognizant to engage core but requires further training. Altered sidelying exercises to decrease back discomfort- added hip extension to challenge core engagement. Cues required to decrease effort to avoid over- tensing.      REHAB POTENTIAL: Good   CLINICAL DECISION MAKING: Stable/uncomplicated   EVALUATION COMPLEXITY: Low     GOALS: Goals reviewed with patient? Yes   SHORT TERM GOALS:   STG Name Target Date Goal status  1 Pt will be able to walk 1 mile without incr in groin pain Baseline: up to 5 miles without residual pain 07/29/21   achieved  2 Able to demo SLS with level pelvis without cuing Baseline: able to demo with awareness of core engagement but still requires cues 07/29/21 achieved                                                  LONG TERM GOALS:    LTG Name Target Date Goal status  1 Gross hip strength to 5/5 Baseline:see chart in documentation 08/19/21 INITIAL  2 Pt will return to use of P90x as a form of exercise Baseline:avoiding at eval due to groin pain 08/19/21 INITIAL  3 Pt will be able to return to walking for exercise without limitation by groin pain Baseline: unable to particpate in walking program at eval 08/19/21 INITIAL  4 Pt will Improve LEFS by [place] Baseline:to be evaluated at [facility] 08/19/21 INITIAL                               PLAN: PT FREQUENCY: 1-2x/week   PT DURATION: 6 weeks   PLANNED INTERVENTIONS: Therapeutic exercises, Therapeutic activity, Neuro Muscular re-education, Balance training, Gait training, Patient/Family education, Joint mobilization, Stair training, Aquatic Therapy, Dry Needling, and Manual therapy   PLAN FOR NEXT SESSION: continue core strengthening, revisit standing balance    Alexys Gassett C. Tyaira Heward PT, DPT 07/25/21 9:27 AM

## 2021-07-25 ENCOUNTER — Other Ambulatory Visit: Payer: Self-pay

## 2021-07-25 ENCOUNTER — Encounter (HOSPITAL_BASED_OUTPATIENT_CLINIC_OR_DEPARTMENT_OTHER): Payer: Self-pay | Admitting: Physical Therapy

## 2021-07-25 ENCOUNTER — Ambulatory Visit (HOSPITAL_BASED_OUTPATIENT_CLINIC_OR_DEPARTMENT_OTHER): Payer: Medicare HMO | Admitting: Physical Therapy

## 2021-07-25 DIAGNOSIS — R262 Difficulty in walking, not elsewhere classified: Secondary | ICD-10-CM | POA: Diagnosis not present

## 2021-07-25 DIAGNOSIS — Z981 Arthrodesis status: Secondary | ICD-10-CM | POA: Diagnosis not present

## 2021-07-25 DIAGNOSIS — M25552 Pain in left hip: Secondary | ICD-10-CM

## 2021-07-25 DIAGNOSIS — M6281 Muscle weakness (generalized): Secondary | ICD-10-CM | POA: Diagnosis not present

## 2021-07-26 ENCOUNTER — Encounter (INDEPENDENT_AMBULATORY_CARE_PROVIDER_SITE_OTHER): Payer: Self-pay | Admitting: Family Medicine

## 2021-07-26 ENCOUNTER — Encounter (HOSPITAL_BASED_OUTPATIENT_CLINIC_OR_DEPARTMENT_OTHER): Payer: Self-pay | Admitting: Physical Therapy

## 2021-07-26 NOTE — Telephone Encounter (Signed)
Please review

## 2021-07-27 ENCOUNTER — Other Ambulatory Visit: Payer: Self-pay

## 2021-07-27 ENCOUNTER — Encounter (HOSPITAL_BASED_OUTPATIENT_CLINIC_OR_DEPARTMENT_OTHER): Payer: Self-pay | Admitting: Physical Therapy

## 2021-07-27 ENCOUNTER — Ambulatory Visit (HOSPITAL_BASED_OUTPATIENT_CLINIC_OR_DEPARTMENT_OTHER): Payer: Medicare HMO | Attending: Family Medicine | Admitting: Physical Therapy

## 2021-07-27 DIAGNOSIS — M25552 Pain in left hip: Secondary | ICD-10-CM | POA: Diagnosis not present

## 2021-07-27 NOTE — Therapy (Signed)
OUTPATIENT PHYSICAL THERAPY TREATMENT NOTE   Patient Name: [name] [name] MRN: [id number] DOB:June 12, 1951, 70 y.o., male Today's Date: 07/27/2021  PCP: [name], MD REFERRING PROVIDER: [name], MD   PT End of Session - 07/27/21 0850     Visit Number 6    Number of Visits 13    Date for PT Re-Evaluation 08/19/21    Authorization Type [employer] MCR    PT Start Time 0846    PT Stop Time 0925    PT Time Calculation (min) 39 min    Activity Tolerance Patient tolerated treatment well    Behavior During Therapy [facility] for tasks assessed/performed               Past Medical History:  Diagnosis Date   ALLERGIC RHINITIS 04/22/2007   Allergy    Cancer ([place])    HX SKIN CANCER   Complication of anesthesia    more concerned about positioning of head & neck because of TMJ   DDD (degenerative disc disease), cervical    GERD (gastroesophageal reflux disease)    infrequently   Headache(784.0)    migraines   History of kidney stones    History of skin cancer    History of TMJ disorder    HYPERCHOLESTEROLEMIA 04/22/2007   HYPERLIPIDEMIA 07/29/2007   Hyperplastic colon polyp    NEPHROLITHIASIS, HX OF 07/29/2007   Osteoarth NOS-Unspec 04/22/2007   Shortness of breath    Shoulder pain, acute    bilateral   Past Surgical History:  Procedure Laterality Date   APPENDECTOMY     BACK SURGERY  10/07/2015   COLONOSCOPY     10 yrs ago- 2 HPP    COLONOSCOPY  2019   JOINT REPLACEMENT     KNEE ARTHROSCOPY     right x2   SHOULDER SURGERY     right   skin cancer biopsy     L forehead   TOTAL KNEE ARTHROPLASTY Right 08/25/2013   Procedure: RIGHT TOTAL KNEE ARTHROPLASTY;  Surgeon: Gearlean Alf, MD;  Location: [facility];  Service: Orthopedics;  Laterality: Right;   TOTAL SHOULDER ARTHROPLASTY Left 12/25/2013   DR SUPPLE    TOTAL SHOULDER ARTHROPLASTY Left 12/25/2013   Procedure: LEFT TOTAL SHOULDER ARTHROPLASTY;  Surgeon: Marin Shutter, MD;  Location: [place];  Service: Orthopedics;   Laterality: Left;   TOTAL SHOULDER ARTHROPLASTY Right 06/18/2014   DR SUPPLE   TOTAL SHOULDER ARTHROPLASTY Right 06/18/2014   Procedure: RIGHT TOTAL SHOULDER ARTHROPLASTY;  Surgeon: Marin Shutter, MD;  Location: [place];  Service: Orthopedics;  Laterality: Right;   WISDOM TOOTH EXTRACTION     Patient Active Problem List   Diagnosis Date Noted   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   History of replacement of both shoulder joints 05/06/2020   History of spinal fusion 05/06/2020   History of stress fracture 05/06/2020   Chronic migraine without aura, with intractable migraine, so stated, with status migrainosus 12/02/2018   OSA (obstructive sleep apnea) 12/02/2018   Tinnitus 03/20/2018   Prediabetes 11/21/2017   Other fatigue 06/18/2017   Shortness of breath on exertion 06/18/2017   Other hyperlipidemia 06/18/2017   Vitamin D deficiency 06/18/2017   Hyperglycemia 06/18/2017   Class 1 obesity with serious comorbidity and body mass index (BMI) of 34.0 to 34.9 in adult 06/18/2017   Right lumbar radiculopathy 08/24/2014   Piriformis syndrome of right side 08/24/2014   S/P shoulder replacement 12/25/2013   Migraine headache 12/22/2013  OA (osteoarthritis) of knee 08/25/2013   HYPERLIPIDEMIA 07/29/2007   NEPHROLITHIASIS, HX OF 07/29/2007   HYPERCHOLESTEROLEMIA 04/22/2007   Allergic rhinitis 04/22/2007   Osteoarthritis 04/22/2007    REFERRING DIAG: M25.552 (ICD-10-CM) - Left hip pain   THERAPY DIAG: Pain in left hip  PERTINENT HISTORY: bil TSA  PRECAUTIONS: none  SUBJECTIVE:Did 5 miles and exercises. 17.5 min miles. I have not had any pain after walking. I sat to have my hair cut after my walk yesterday and still had no groin pain. Did P90x boxing.   PAIN:  Are you having pain? No VAS scale: 0/10 Pain location: Rt groin  Aggravating factors: long distance walking Relieving factors: sit   OBJECTIVE:    DIAGNOSTIC FINDINGS: History of lumbar fusion with  degenerative changes lumbar spine. No significant hip DJD or fracture- per MD note    PATIENT SURVEYS:  LEFS 63/80- EVAL;    COGNITION:          Overall cognitive status: Within functional limits for tasks assessed                        SENSATION:          Light touch: Appears intact               POSTURE:  Mild kyphosis, Rt hip elevation in standing, LLD- Rt longer   LE AROM/PROM:   A/PROM Right 07/06/2021 Left 07/06/2021  Hip flexion      Hip extension      Hip abduction      Hip adduction      Hip internal rotation      Hip external rotation      Knee flexion      Knee extension      Ankle dorsiflexion      Ankle plantarflexion      Ankle inversion      Ankle eversion       (Blank rows = not tested)   LE MMT:   MMT Right 07/06/2021 Left 07/06/2021 Bilateral 10/31  Hip flexion 4 4 5   Hip extension 4 5   Hip abduction 4 4 5   Hip adduction       Hip internal rotation       Hip external rotation       Knee flexion       Knee extension       Ankle dorsiflexion       Ankle plantarflexion       Ankle inversion       Ankle eversion        (Blank rows = not tested)    mild trunk rotation in SLR MMT for lack of core engagement 10/31     GAIT: 10/24: trendelenburg with incr speed     TODAY'S TREATMENT: 11/2  Tandem alt shoulder ext green tband  Tandem antirotation red tband  Diver to cone- required UE assist when standing on Rt leg  SLS with opp toes on floor- holding cone at arms length- watch cone with trunk rotation  Heel raises- with + without UE for balance, added ball bw ankles  10/31  Sidelying hip flexion/ext & Sidelying bicycle motion; supine piriformis stretch after each side  Supine HS stretch with strap- midline & lateral bias  SKTC with deep breathing  Walking evaluation for pattern  Qped with rocking+ core engagement; added alt hip ext- leg off edge of table to engage core from hip flexed position  10/24  Gait on track- review &  speed challenges  Seated HSS, piriformis  Dead bug extension from hooklying & from 90/90  90/90 SL lift with alt knee/heel taps  10/21  Manual: STM and trigger point release in Rt piriformis glut med/min  Supine piriformis stretch- pull and press  Qped on elbows- core engagement, alt hip ext  Standing: SLs with level pelvis in mirror; HS, gastroc, add, hip flexor stretching  10/19  Clam  SL hip abd & circles  Piriformis stretch hooklying  Transv abdominis with ball in hooklying- added UE flexion alt & bil moving together, marching  Manual: roller & STM to Rt post hip  EVAL  Heel lift in Left shoe- 2 layers SLS with hip abd activation- marching and walking with activation     PATIENT EDUCATION:  Education details: HEP, exercise form/rationale   Person educated: Patient Education method: Explanation, Demonstration, Tactile cues, Verbal cues, and Handouts Education comprehension: verbalized understanding, returned demonstration, verbal cues required, tactile cues required, and needs further education     HOME EXERCISE PROGRAM: Progress either speed of walking or distance to max of 8 miles [id number]   ASSESSMENT:   CLINICAL IMPRESSION: Improved awareness of core/hip engagement in standing balance exercises. Noted to have more difficulty balancing on Rt LE than Lt. Adding ball bw ankles in heel raises to reduce supination which caused tension in adductor group as well.      REHAB POTENTIAL: Good   CLINICAL DECISION MAKING: Stable/uncomplicated   EVALUATION COMPLEXITY: Low     GOALS: Goals reviewed with patient? Yes   SHORT TERM GOALS:   STG Name Target Date Goal status  1 Pt will be able to walk 1 mile without incr in groin pain Baseline: up to 5 miles without residual pain 07/29/21   achieved  2 Able to demo SLS with level pelvis without cuing Baseline: able to demo with awareness of core engagement but still requires cues 07/29/21 achieved                                                  LONG TERM GOALS:    LTG Name Target Date Goal status  1 Gross hip strength to 5/5 Baseline:see chart in documentation 08/19/21 INITIAL  2 Pt will return to use of P90x as a form of exercise Baseline:avoiding at eval due to groin pain 08/19/21 INITIAL  3 Pt will be able to return to walking for exercise without limitation by groin pain Baseline: unable to particpate in walking program at eval 08/19/21 INITIAL  4 Pt will Improve LEFS by [place] Baseline:to be evaluated at [facility] 08/19/21 INITIAL                               PLAN: PT FREQUENCY: 1-2x/week   PT DURATION: 6 weeks   PLANNED INTERVENTIONS: Therapeutic exercises, Therapeutic activity, Neuro Muscular re-education, Balance training, Gait training, Patient/Family education, Joint mobilization, Stair training, Aquatic Therapy, Dry Needling, and Manual therapy   PLAN FOR NEXT SESSION: cont progressing balance + strength   Sion Thane C. Gilmer Kaminsky PT, DPT 07/27/21 9:27 AM

## 2021-07-31 NOTE — Therapy (Incomplete)
OUTPATIENT PHYSICAL THERAPY TREATMENT NOTE   Patient Name: [name] [name] MRN: [id number] DOB:12-15-1950, 70 y.o., male Today's Date: 07/31/2021  PCP: [name], MD REFERRING PROVIDER: [name], MD       Past Medical History:  Diagnosis Date   ALLERGIC RHINITIS 04/22/2007   Allergy    Cancer ([place])    HX SKIN CANCER   Complication of anesthesia    more concerned about positioning of head & neck because of TMJ   DDD (degenerative disc disease), cervical    GERD (gastroesophageal reflux disease)    infrequently   Headache(784.0)    migraines   History of kidney stones    History of skin cancer    History of TMJ disorder    HYPERCHOLESTEROLEMIA 04/22/2007   HYPERLIPIDEMIA 07/29/2007   Hyperplastic colon polyp    NEPHROLITHIASIS, HX OF 07/29/2007   Osteoarth NOS-Unspec 04/22/2007   Shortness of breath    Shoulder pain, acute    bilateral   Past Surgical History:  Procedure Laterality Date   APPENDECTOMY     BACK SURGERY  10/07/2015   COLONOSCOPY     10 yrs ago- 2 HPP    COLONOSCOPY  2019   JOINT REPLACEMENT     KNEE ARTHROSCOPY     right x2   SHOULDER SURGERY     right   skin cancer biopsy     L forehead   TOTAL KNEE ARTHROPLASTY Right 08/25/2013   Procedure: RIGHT TOTAL KNEE ARTHROPLASTY;  Surgeon: Gearlean Alf, MD;  Location: [facility];  Service: Orthopedics;  Laterality: Right;   TOTAL SHOULDER ARTHROPLASTY Left 12/25/2013   DR SUPPLE    TOTAL SHOULDER ARTHROPLASTY Left 12/25/2013   Procedure: LEFT TOTAL SHOULDER ARTHROPLASTY;  Surgeon: Marin Shutter, MD;  Location: [place];  Service: Orthopedics;  Laterality: Left;   TOTAL SHOULDER ARTHROPLASTY Right 06/18/2014   DR SUPPLE   TOTAL SHOULDER ARTHROPLASTY Right 06/18/2014   Procedure: RIGHT TOTAL SHOULDER ARTHROPLASTY;  Surgeon: Marin Shutter, MD;  Location: [place];  Service: Orthopedics;  Laterality: Right;   WISDOM TOOTH EXTRACTION     Patient Active Problem List   Diagnosis Date Noted   History of  total right knee replacement (TKR) 05/06/2020   History of replacement of both shoulder joints 05/06/2020   History of spinal fusion 05/06/2020   History of stress fracture 05/06/2020   Chronic migraine without aura, with intractable migraine, so stated, with status migrainosus 12/02/2018   OSA (obstructive sleep apnea) 12/02/2018   Tinnitus 03/20/2018   Prediabetes 11/21/2017   Other fatigue 06/18/2017   Shortness of breath on exertion 06/18/2017   Other hyperlipidemia 06/18/2017   Vitamin D deficiency 06/18/2017   Hyperglycemia 06/18/2017   Class 1 obesity with serious comorbidity and body mass index (BMI) of 34.0 to 34.9 in adult 06/18/2017   Right lumbar radiculopathy 08/24/2014   Piriformis syndrome of right side 08/24/2014   S/P shoulder replacement 12/25/2013   Migraine headache 12/22/2013   OA (osteoarthritis) of knee 08/25/2013   HYPERLIPIDEMIA 07/29/2007   NEPHROLITHIASIS, HX OF 07/29/2007   HYPERCHOLESTEROLEMIA 04/22/2007   Allergic rhinitis 04/22/2007   Osteoarthritis 04/22/2007    REFERRING DIAG: M25.552 (ICD-10-CM) - Left hip pain   THERAPY DIAG: No diagnosis found.  PERTINENT HISTORY: bil TSA  PRECAUTIONS: none  SUBJECTIVE:  ***  PAIN:  Are you having pain? No VAS scale: 0/10 Pain location: Rt groin  Aggravating factors: long distance walking Relieving factors: sit   OBJECTIVE:  DIAGNOSTIC FINDINGS: History of lumbar fusion with degenerative changes lumbar spine. No significant hip DJD or fracture- per MD note    PATIENT SURVEYS:  LEFS 63/80- EVAL;    COGNITION:          Overall cognitive status: Within functional limits for tasks assessed                        SENSATION:          Light touch: Appears intact               POSTURE:  Mild kyphosis, Rt hip elevation in standing, LLD- Rt longer   LE AROM/PROM:   A/PROM Right 07/06/2021 Left 07/06/2021  Hip flexion      Hip extension      Hip abduction      Hip adduction      Hip  internal rotation      Hip external rotation      Knee flexion      Knee extension      Ankle dorsiflexion      Ankle plantarflexion      Ankle inversion      Ankle eversion       (Blank rows = not tested)   LE MMT:   MMT Right 07/06/2021 Left 07/06/2021 Bilateral 10/31  Hip flexion 4 4 5   Hip extension 4 5   Hip abduction 4 4 5   Hip adduction       Hip internal rotation       Hip external rotation       Knee flexion       Knee extension       Ankle dorsiflexion       Ankle plantarflexion       Ankle inversion       Ankle eversion        (Blank rows = not tested)    mild trunk rotation in SLR MMT for lack of core engagement 10/31     GAIT: 10/24: trendelenburg with incr speed     TODAY'S TREATMENT: 11/5  ***  11/2  Tandem alt shoulder ext green tband  Tandem antirotation red tband  Diver to cone- required UE assist when standing on Rt leg  SLS with opp toes on floor- holding cone at arms length- watch cone with trunk rotation  Heel raises- with + without UE for balance, added ball bw ankles  10/31  Sidelying hip flexion/ext & Sidelying bicycle motion; supine piriformis stretch after each side  Supine HS stretch with strap- midline & lateral bias  SKTC with deep breathing  Walking evaluation for pattern  Qped with rocking+ core engagement; added alt hip ext- leg off edge of table to engage core from hip flexed position  10/24  Gait on track- review & speed challenges  Seated HSS, piriformis  Dead bug extension from hooklying & from 90/90  90/90 SL lift with alt knee/heel taps  10/21  Manual: STM and trigger point release in Rt piriformis glut med/min  Supine piriformis stretch- pull and press  Qped on elbows- core engagement, alt hip ext  Standing: SLs with level pelvis in mirror; HS, gastroc, add, hip flexor stretching  10/19  Clam  SL hip abd & circles  Piriformis stretch hooklying  Transv abdominis with ball in hooklying- added UE flexion alt &  bil moving together, marching  Manual: roller & STM to Rt post hip  EVAL  Heel lift in  Left shoe- 2 layers SLS with hip abd activation- marching and walking with activation     PATIENT EDUCATION:  Education details: HEP, exercise form/rationale   Person educated: Patient Education method: Explanation, Demonstration, Tactile cues, Verbal cues, and Handouts Education comprehension: verbalized understanding, returned demonstration, verbal cues required, tactile cues required, and needs further education     HOME EXERCISE PROGRAM: Progress either speed of walking or distance to max of 8 miles [id number]   ASSESSMENT:   CLINICAL IMPRESSION: ***  Improved awareness of core/hip engagement in standing balance exercises. Noted to have more difficulty balancing on Rt LE than Lt. Adding ball bw ankles in heel raises to reduce supination which caused tension in adductor group as well.      REHAB POTENTIAL: Good   CLINICAL DECISION MAKING: Stable/uncomplicated   EVALUATION COMPLEXITY: Low     GOALS: Goals reviewed with patient? Yes   SHORT TERM GOALS:   STG Name Target Date Goal status  1 Pt will be able to walk 1 mile without incr in groin pain Baseline: up to 5 miles without residual pain 07/29/21   achieved  2 Able to demo SLS with level pelvis without cuing Baseline: able to demo with awareness of core engagement but still requires cues 07/29/21 achieved                                                 LONG TERM GOALS:    LTG Name Target Date Goal status  1 Gross hip strength to 5/5 Baseline:see chart in documentation 08/19/21 INITIAL  2 Pt will return to use of P90x as a form of exercise Baseline:avoiding at eval due to groin pain 08/19/21 INITIAL  3 Pt will be able to return to walking for exercise without limitation by groin pain Baseline: unable to particpate in walking program at eval 08/19/21 INITIAL  4 Pt will Improve LEFS by [place] Baseline:to be evaluated at [facility]  08/19/21 INITIAL                               PLAN: PT FREQUENCY: 1-2x/week   PT DURATION: 6 weeks   PLANNED INTERVENTIONS: Therapeutic exercises, Therapeutic activity, Neuro Muscular re-education, Balance training, Gait training, Patient/Family education, Joint mobilization, Stair training, Aquatic Therapy, Dry Needling, and Manual therapy   PLAN FOR NEXT SESSION: cont progressing balance + strength   Glenn Christo C. Jailyn Langhorst PT, DPT 07/31/21 8:27 PM

## 2021-08-01 ENCOUNTER — Ambulatory Visit (HOSPITAL_BASED_OUTPATIENT_CLINIC_OR_DEPARTMENT_OTHER): Payer: Medicare HMO | Admitting: Physical Therapy

## 2021-08-01 ENCOUNTER — Encounter (HOSPITAL_BASED_OUTPATIENT_CLINIC_OR_DEPARTMENT_OTHER): Payer: Self-pay

## 2021-08-03 ENCOUNTER — Encounter (HOSPITAL_BASED_OUTPATIENT_CLINIC_OR_DEPARTMENT_OTHER): Payer: Self-pay

## 2021-08-03 ENCOUNTER — Telehealth (INDEPENDENT_AMBULATORY_CARE_PROVIDER_SITE_OTHER): Payer: Self-pay

## 2021-08-03 ENCOUNTER — Encounter (HOSPITAL_BASED_OUTPATIENT_CLINIC_OR_DEPARTMENT_OTHER): Payer: Medicare HMO | Admitting: Physical Therapy

## 2021-08-03 NOTE — Telephone Encounter (Signed)
I received a call from pt whom was on the phone with [place] at the same time with me.  Olamarie asked if I could give her patient's DOB and also the provider's full name and NPI #, also the date when the provider signed the form for pt.  Information has been given to her as asked.

## 2021-08-08 ENCOUNTER — Other Ambulatory Visit: Payer: Self-pay

## 2021-08-08 ENCOUNTER — Ambulatory Visit (HOSPITAL_BASED_OUTPATIENT_CLINIC_OR_DEPARTMENT_OTHER): Payer: Medicare HMO | Admitting: Physical Therapy

## 2021-08-08 ENCOUNTER — Encounter (HOSPITAL_BASED_OUTPATIENT_CLINIC_OR_DEPARTMENT_OTHER): Payer: Self-pay | Admitting: Physical Therapy

## 2021-08-08 DIAGNOSIS — M25552 Pain in left hip: Secondary | ICD-10-CM

## 2021-08-08 NOTE — Progress Notes (Signed)
   I, Wendy Poet, LAT, ATC, am serving as scribe for Dr. [name].  [name] [name] is a 70 y.o. male who presents to [facility] [facility] today for f/u of L hip/groin pain possibly due to muscular strain, hip impingement or lumbar radiculopathy.  He was last seen by Dr. Georgina Snell on 06/28/21 and was referred to PT of which he's completed 7 sessions.  He was also advised to use a thigh compression sleeve.  Today, pt reports L groin pain is all better. Pt notes great satisfaction w/ PT, correcting leg length discrepancy and longer stride length.  Diagnostic testing: L hip XR- 06/28/21; L-spine CT w/ contrast- 07/12/15  Pertinent review of systems: No fevers or chills  Relevant historical information: Migraine headaches.   Exam:  BP 128/88   Pulse 68   Ht 5\' 9"  (1.753 m)   Wt 243 lb (110.2 kg)   SpO2 98%   BMI 35.88 kg/m  General: Well Developed, well nourished, and in no acute distress.   MSK: Left hip normal motion. Gait mild supination.    Lab and Radiology Results EXAM: DG HIP (WITH OR WITHOUT PELVIS) 2-3V LEFT   COMPARISON:  CT abdomen pelvis dated 04/08/2021.   FINDINGS: There is no acute fracture or dislocation. The bones are well mineralized. Mild bilateral hip arthritic changes. Lower lumbar fusion. The soft tissues are unremarkable.   IMPRESSION: No acute fracture or dislocation.     Electronically Signed   By: Anner Crete M.D.   On: 06/28/2021 16:20 I, [name], personally (independently) visualized and performed the interpretation of the images attached in this note.    Assessment and Plan: 70 y.o. male with hip pain.  Significant improvement with physical therapy.  Patient has had an excellent course of PT which improved his leg length discrepancy and worked on hip abductor weakness and changed his gait mechanics in bed.  This has resolved his hip pain.  He has been able to resume his normal walking goals of 5-8 miles per day  and about a 16 to 17 minute per mile pace. We spent time discussing his supination, mechanics, goals of exercise, and shoe wear patterns, and potential breakdown patterns in the future. He is satisfied with how things are going.  Plan to continue PT and finish out PT when both physical therapy and the patient are satisfied. Recheck back with me as needed. Total encounter time 30 minutes including face-to-face time with the patient and, reviewing past medical record, and charting on the date of service.   Discussion as above   Discussed warning signs or symptoms. Please see discharge instructions. Patient expresses understanding.   The above documentation has been reviewed and is accurate and complete [name], M.D.

## 2021-08-08 NOTE — Therapy (Signed)
OUTPATIENT PHYSICAL THERAPY TREATMENT NOTE   Patient Name: [name] [name] MRN: [id number] DOB:03/07/1951, 70 y.o., male Today's Date: 08/08/2021  PCP: [name], MD REFERRING PROVIDER: [name], MD   PT End of Session - 08/08/21 0854     Visit Number 7    Number of Visits 13    Date for PT Re-Evaluation 08/19/21    Authorization Type [employer] MCR    PT Start Time 0848    PT Stop Time 0929    PT Time Calculation (min) 41 min    Activity Tolerance Patient tolerated treatment well    Behavior During Therapy [facility] for tasks assessed/performed               Past Medical History:  Diagnosis Date   ALLERGIC RHINITIS 04/22/2007   Allergy    Cancer ([place])    HX SKIN CANCER   Complication of anesthesia    more concerned about positioning of head & neck because of TMJ   DDD (degenerative disc disease), cervical    GERD (gastroesophageal reflux disease)    infrequently   Headache(784.0)    migraines   History of kidney stones    History of skin cancer    History of TMJ disorder    HYPERCHOLESTEROLEMIA 04/22/2007   HYPERLIPIDEMIA 07/29/2007   Hyperplastic colon polyp    NEPHROLITHIASIS, HX OF 07/29/2007   Osteoarth NOS-Unspec 04/22/2007   Shortness of breath    Shoulder pain, acute    bilateral   Past Surgical History:  Procedure Laterality Date   APPENDECTOMY     BACK SURGERY  10/07/2015   COLONOSCOPY     10 yrs ago- 2 HPP    COLONOSCOPY  2019   JOINT REPLACEMENT     KNEE ARTHROSCOPY     right x2   SHOULDER SURGERY     right   skin cancer biopsy     L forehead   TOTAL KNEE ARTHROPLASTY Right 08/25/2013   Procedure: RIGHT TOTAL KNEE ARTHROPLASTY;  Surgeon: Gearlean Alf, MD;  Location: [facility];  Service: Orthopedics;  Laterality: Right;   TOTAL SHOULDER ARTHROPLASTY Left 12/25/2013   DR SUPPLE    TOTAL SHOULDER ARTHROPLASTY Left 12/25/2013   Procedure: LEFT TOTAL SHOULDER ARTHROPLASTY;  Surgeon: Marin Shutter, MD;  Location: [place];  Service: Orthopedics;   Laterality: Left;   TOTAL SHOULDER ARTHROPLASTY Right 06/18/2014   DR SUPPLE   TOTAL SHOULDER ARTHROPLASTY Right 06/18/2014   Procedure: RIGHT TOTAL SHOULDER ARTHROPLASTY;  Surgeon: Marin Shutter, MD;  Location: [place];  Service: Orthopedics;  Laterality: Right;   WISDOM TOOTH EXTRACTION     Patient Active Problem List   Diagnosis Date Noted   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   History of replacement of both shoulder joints 05/06/2020   History of spinal fusion 05/06/2020   History of stress fracture 05/06/2020   Chronic migraine without aura, with intractable migraine, so stated, with status migrainosus 12/02/2018   OSA (obstructive sleep apnea) 12/02/2018   Tinnitus 03/20/2018   Prediabetes 11/21/2017   Other fatigue 06/18/2017   Shortness of breath on exertion 06/18/2017   Other hyperlipidemia 06/18/2017   Vitamin D deficiency 06/18/2017   Hyperglycemia 06/18/2017   Class 1 obesity with serious comorbidity and body mass index (BMI) of 34.0 to 34.9 in adult 06/18/2017   Right lumbar radiculopathy 08/24/2014   Piriformis syndrome of right side 08/24/2014   S/P shoulder replacement 12/25/2013   Migraine headache 12/22/2013  OA (osteoarthritis) of knee 08/25/2013   HYPERLIPIDEMIA 07/29/2007   NEPHROLITHIASIS, HX OF 07/29/2007   HYPERCHOLESTEROLEMIA 04/22/2007   Allergic rhinitis 04/22/2007   Osteoarthritis 04/22/2007    REFERRING DIAG: M25.552 (ICD-10-CM) - Left hip pain   THERAPY DIAG: Pain in left hip  PERTINENT HISTORY: bil TSA  PRECAUTIONS: none  SUBJECTIVE:I have been walking daily without issue from hips. Long steps and insert have been amazing. 16 min miles for about 5 miles.   PAIN:  Are you having pain? No VAS scale: 0/10 Pain location: Rt groin  Aggravating factors: long distance walking Relieving factors: sit   OBJECTIVE:    DIAGNOSTIC FINDINGS: History of lumbar fusion with degenerative changes lumbar spine. No significant hip  DJD or fracture- per MD note    PATIENT SURVEYS:  LEFS 63/80- EVAL;  68/80 on 11/14   COGNITION:          Overall cognitive status: Within functional limits for tasks assessed                        SENSATION:          Light touch: Appears intact               POSTURE:  Mild kyphosis, Rt hip elevation in standing, LLD- Rt longer   LE AROM/PROM:   A/PROM Right 07/06/2021 Left 07/06/2021  Hip flexion      Hip extension      Hip abduction      Hip adduction      Hip internal rotation      Hip external rotation      Knee flexion      Knee extension      Ankle dorsiflexion      Ankle plantarflexion      Ankle inversion      Ankle eversion       (Blank rows = not tested)   LE MMT:   MMT Right 07/06/2021 Left 07/06/2021 Bilateral 10/31  Hip flexion 4 4 5   Hip extension 4 5   Hip abduction 4 4 5   Hip adduction       Hip internal rotation       Hip external rotation       Knee flexion       Knee extension       Ankle dorsiflexion       Ankle plantarflexion       Ankle inversion       Ankle eversion        (Blank rows = not tested)    mild trunk rotation in SLR MMT for lack of core engagement 10/31     GAIT: 10/24: trendelenburg with incr speed     TODAY'S TREATMENT: 11/14  Step ups- fwd & lateral, core+gluts, balance at top  Lateral lunges  Dead lift 20lb bar  08-07-2023  Tandem alt shoulder ext green tband  Tandem antirotation red tband  Diver to cone- required UE assist when standing on Rt leg  SLS with opp toes on floor- holding cone at arms length- watch cone with trunk rotation  Heel raises- with + without UE for balance, added ball bw ankles  10/31  Sidelying hip flexion/ext & Sidelying bicycle motion; supine piriformis stretch after each side  Supine HS stretch with strap- midline & lateral bias  SKTC with deep breathing  Walking evaluation for pattern  Qped with rocking+ core engagement; added alt hip ext- leg off edge of  table to engage core  from hip flexed position  10/24  Gait on track- review & speed challenges  Seated HSS, piriformis  Dead bug extension from hooklying & from 90/90  90/90 SL lift with alt knee/heel taps  10/21  Manual: STM and trigger point release in Rt piriformis glut med/min  Supine piriformis stretch- pull and press  Qped on elbows- core engagement, alt hip ext  Standing: SLs with level pelvis in mirror; HS, gastroc, add, hip flexor stretching  10/19  Clam  SL hip abd & circles  Piriformis stretch hooklying  Transv abdominis with ball in hooklying- added UE flexion alt & bil moving together, marching  Manual: roller & STM to Rt post hip  EVAL  Heel lift in Left shoe- 2 layers SLS with hip abd activation- marching and walking with activation     PATIENT EDUCATION:  Education details: shoes & foot posture, anatomy of condition, migraines   Person educated: Patient Education method: Explanation, Demonstration, Tactile cues, Verbal cues, and Handouts Education comprehension: verbalized understanding, returned demonstration, verbal cues required, tactile cues required, and needs further education     HOME EXERCISE PROGRAM: Progress either speed of walking or distance to max of 8 miles [id number]   ASSESSMENT:   CLINICAL IMPRESSION: Pt continues to have migraines as a result of exercises that make his hips and low back sore- I will reach out to neuro about this. We discussed his PT and he does not feel like this is a reason to stop and would like to continue to improve his strength. I do believe it is appropriate to further strengthen lumbopelvic region in order to return to level of exercise he wants to. Has been able to progress up to total of 8 miles in walking without return of groin pain. Cues required for glut/core activation in exercises.      REHAB POTENTIAL: Good   CLINICAL DECISION MAKING: Stable/uncomplicated   EVALUATION COMPLEXITY: Low     GOALS: Goals reviewed with  patient? Yes   SHORT TERM GOALS:   STG Name Target Date Goal status  1 Pt will be able to walk 1 mile without incr in groin pain Baseline: up to 5 miles without residual pain 07/29/21   achieved  2 Able to demo SLS with level pelvis without cuing Baseline: able to demo with awareness of core engagement but still requires cues 07/29/21 achieved                                                 LONG TERM GOALS:    LTG Name Target Date Goal status  1 Gross hip strength to 5/5 Baseline:see chart in documentation 08/19/21 INITIAL  2 Pt will return to use of P90x as a form of exercise Baseline:avoiding at eval due to groin pain 08/19/21 INITIAL  3 Pt will be able to return to walking for exercise without limitation by groin pain Baseline: unable to particpate in walking program at eval 08/19/21 INITIAL  4 Pt will Improve LEFS by [place] Baseline:to be evaluated at [facility] 08/19/21 INITIAL                               PLAN: PT FREQUENCY: 1-2x/week   PT DURATION: 6 weeks   PLANNED INTERVENTIONS: Therapeutic exercises, Therapeutic activity, Neuro  Muscular re-education, Balance training, Gait training, Patient/Family education, Joint mobilization, Stair training, Aquatic Therapy, Dry Needling, and Manual therapy   PLAN FOR NEXT SESSION: cont progressing balance + strength   Lokelani Lutes C. Pierson Vantol PT, DPT 08/08/21 12:43 PM

## 2021-08-09 ENCOUNTER — Ambulatory Visit: Payer: Medicare HMO | Admitting: Family Medicine

## 2021-08-09 VITALS — BP 128/88 | HR 68 | Ht 69.0 in | Wt 243.0 lb

## 2021-08-09 DIAGNOSIS — M25552 Pain in left hip: Secondary | ICD-10-CM

## 2021-08-09 NOTE — Patient Instructions (Addendum)
Thank you for coming in today.   Glad you are feeling better.  Recheck back as needed

## 2021-08-10 ENCOUNTER — Encounter (HOSPITAL_BASED_OUTPATIENT_CLINIC_OR_DEPARTMENT_OTHER): Payer: Self-pay | Admitting: Physical Therapy

## 2021-08-10 ENCOUNTER — Ambulatory Visit (HOSPITAL_BASED_OUTPATIENT_CLINIC_OR_DEPARTMENT_OTHER): Payer: Medicare HMO | Admitting: Physical Therapy

## 2021-08-10 ENCOUNTER — Other Ambulatory Visit: Payer: Self-pay

## 2021-08-10 DIAGNOSIS — M25552 Pain in left hip: Secondary | ICD-10-CM

## 2021-08-10 NOTE — Therapy (Addendum)
OUTPATIENT PHYSICAL THERAPY TREATMENT NOTE/Discharge    Patient Name: [name] [name] MRN: [id number] DOB:04-07-1951, 70 y.o., male Today's Date: 08/10/2021  PCP: [name], MD REFERRING PROVIDER: [name], MD   PT End of Session - 08/10/21 0847     Visit Number 8    Number of Visits 13    Date for PT Re-Evaluation 08/19/21    Authorization Type [employer] MCR    PT Start Time 0848    PT Stop Time 0928    PT Time Calculation (min) 40 min    Activity Tolerance Patient tolerated treatment well    Behavior During Therapy [facility] for tasks assessed/performed             Past Medical History:  Diagnosis Date   ALLERGIC RHINITIS 04/22/2007   Allergy    Cancer ([place])    HX SKIN CANCER   Complication of anesthesia    more concerned about positioning of head & neck because of TMJ   DDD (degenerative disc disease), cervical    GERD (gastroesophageal reflux disease)    infrequently   Headache(784.0)    migraines   History of kidney stones    History of skin cancer    History of TMJ disorder    HYPERCHOLESTEROLEMIA 04/22/2007   HYPERLIPIDEMIA 07/29/2007   Hyperplastic colon polyp    NEPHROLITHIASIS, HX OF 07/29/2007   Osteoarth NOS-Unspec 04/22/2007   Shortness of breath    Shoulder pain, acute    bilateral   Past Surgical History:  Procedure Laterality Date   APPENDECTOMY     BACK SURGERY  10/07/2015   COLONOSCOPY     10 yrs ago- 2 HPP    COLONOSCOPY  2019   JOINT REPLACEMENT     KNEE ARTHROSCOPY     right x2   SHOULDER SURGERY     right   skin cancer biopsy     L forehead   TOTAL KNEE ARTHROPLASTY Right 08/25/2013   Procedure: RIGHT TOTAL KNEE ARTHROPLASTY;  Surgeon: Gearlean Alf, MD;  Location: [facility];  Service: Orthopedics;  Laterality: Right;   TOTAL SHOULDER ARTHROPLASTY Left 12/25/2013   DR SUPPLE    TOTAL SHOULDER ARTHROPLASTY Left 12/25/2013   Procedure: LEFT TOTAL SHOULDER ARTHROPLASTY;  Surgeon: Marin Shutter, MD;  Location: [place];  Service:  Orthopedics;  Laterality: Left;   TOTAL SHOULDER ARTHROPLASTY Right 06/18/2014   DR SUPPLE   TOTAL SHOULDER ARTHROPLASTY Right 06/18/2014   Procedure: RIGHT TOTAL SHOULDER ARTHROPLASTY;  Surgeon: Marin Shutter, MD;  Location: [place];  Service: Orthopedics;  Laterality: Right;   WISDOM TOOTH EXTRACTION     Patient Active Problem List   Diagnosis Date Noted   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   History of replacement of both shoulder joints 05/06/2020   History of spinal fusion 05/06/2020   History of stress fracture 05/06/2020   Chronic migraine without aura, with intractable migraine, so stated, with status migrainosus 12/02/2018   OSA (obstructive sleep apnea) 12/02/2018   Tinnitus 03/20/2018   Prediabetes 11/21/2017   Other fatigue 06/18/2017   Shortness of breath on exertion 06/18/2017   Other hyperlipidemia 06/18/2017   Vitamin D deficiency 06/18/2017   Hyperglycemia 06/18/2017   Class 1 obesity with serious comorbidity and body mass index (BMI) of 34.0 to 34.9 in adult 06/18/2017   Right lumbar radiculopathy 08/24/2014   Piriformis syndrome of right side 08/24/2014   S/P shoulder replacement 12/25/2013   Migraine headache 12/22/2013  OA (osteoarthritis) of knee 08/25/2013   HYPERLIPIDEMIA 07/29/2007   NEPHROLITHIASIS, HX OF 07/29/2007   HYPERCHOLESTEROLEMIA 04/22/2007   Allergic rhinitis 04/22/2007   Osteoarthritis 04/22/2007   REFERRING DIAG: M25.552 (ICD-10-CM) - Left hip pain    THERAPY DIAG: Pain in left hip   PERTINENT HISTORY: bil TSA   PRECAUTIONS: none   SUBJECTIVE:I have been walking daily without issue from hips. Long steps and insert have been amazing. 16 min miles for about 5 miles.    PAIN:  Are you having pain? No VAS scale: 0/10 Pain location: Rt groin   Aggravating factors: long distance walking Relieving factors: sit     OBJECTIVE:    DIAGNOSTIC FINDINGS: History of lumbar fusion with degenerative changes lumbar  spine. No significant hip DJD or fracture- per MD note    PATIENT SURVEYS:  LEFS 63/80- EVAL;  68/80 on 11/14   COGNITION:          Overall cognitive status: Within functional limits for tasks assessed                        SENSATION:          Light touch: Appears intact               POSTURE:  Mild kyphosis, Rt hip elevation in standing, LLD- Rt longer   LE AROM/PROM:   A/PROM Right 07/06/2021 Left 07/06/2021  Hip flexion      Hip extension      Hip abduction      Hip adduction      Hip internal rotation      Hip external rotation      Knee flexion      Knee extension      Ankle dorsiflexion      Ankle plantarflexion      Ankle inversion      Ankle eversion       (Blank rows = not tested)   LE MMT:   MMT Right 07/06/2021 Left 07/06/2021 Bilateral 10/31  Hip flexion _0 Hip extension 4 5    Hip abduction _1 Hip adduction        Hip internal rotation        Hip external rotation        Knee flexion        Knee extension        Ankle dorsiflexion        Ankle plantarflexion        Ankle inversion        Ankle eversion         (Blank rows = not tested)    mild trunk rotation in SLR MMT for lack of core engagement 10/31     GAIT: 10/24: trendelenburg with incr speed     TODAY'S TREATMENT: 11/15 LTR x15  Bridge 2x10  Piriformis stretch 2x20 sec push and pull SLR side lying x15 each leg  Pallof press x10 5lbs each side 10 lbsx10 each side  Shoulder extension 2x20 20 lbs  Cone touch 4x5 each leg   11/14             Step ups- fwd & lateral, core+gluts, balance at top             Lateral lunges             Dead lift 20lb bar   08-26-23  Tandem alt shoulder ext green tband             Tandem antirotation red tband             Diver to cone- required UE assist when standing on Rt leg             SLS with opp toes on floor- holding cone at arms length- watch cone with trunk rotation             Heel raises- with + without UE for  balance, added ball bw ankles   10/31             Sidelying hip flexion/ext & Sidelying bicycle motion; supine piriformis stretch after each side             Supine HS stretch with strap- midline & lateral bias             SKTC with deep breathing             Walking evaluation for pattern             Qped with rocking+ core engagement; added alt hip ext- leg off edge of table to engage core from hip flexed position   10/24             Gait on track- review & speed challenges             Seated HSS, piriformis             Dead bug extension from hooklying & from 90/90             90/90 SL lift with alt knee/heel taps   10/21             Manual: STM and trigger point release in Rt piriformis glut med/min             Supine piriformis stretch- pull and press             Qped on elbows- core engagement, alt hip ext             Standing: SLs with level pelvis in mirror; HS, gastroc, add, hip flexor stretching   10/19             Clam             SL hip abd & circles             Piriformis stretch hooklying             Transv abdominis with ball in hooklying- added UE flexion alt & bil moving together, marching             Manual: roller & STM to Rt post hip   EVAL   Heel lift in Left shoe- 2 layers SLS with hip abd activation- marching and walking with activation     PATIENT EDUCATION:  Education details: shoes & foot posture, anatomy of condition, migraines   Person educated: Patient Education method: Explanation, Demonstration, Tactile cues, Verbal cues, and Handouts Education comprehension: verbalized understanding, returned demonstration, verbal cues required, tactile cues required, and needs further education     HOME EXERCISE PROGRAM: Progress either speed of walking or distance to max of 8 miles [id number]   ASSESSMENT:   CLINICAL IMPRESSION: Patient tolerated treatment well.  He had minor pain in his shoulder with pallof press with 10lbs. He was advised to do 5 lbs  for HEP. He tolerated. All other  exercises well. He continues to supinate with single leg stance. Therapy will continue to advance exercises as tolerated.      REHAB POTENTIAL: Good   CLINICAL DECISION MAKING: Stable/uncomplicated   EVALUATION COMPLEXITY: Low     GOALS: Goals reviewed with patient? Yes   SHORT TERM GOALS:   STG Name Target Date Goal status  1 Pt will be able to walk 1 mile without incr in groin pain Baseline: up to 5 miles without residual pain 07/29/21   achieved  2 Able to demo SLS with level pelvis without cuing Baseline: able to demo with awareness of core engagement but still requires cues 07/29/21 achieved                                                 LONG TERM GOALS:    LTG Name Target Date Goal status  1 Gross hip strength to 5/5 Baseline:see chart in documentation 08/19/21 INITIAL  2 Pt will return to use of P90x as a form of exercise Baseline:avoiding at eval due to groin pain 08/19/21 INITIAL  3 Pt will be able to return to walking for exercise without limitation by groin pain Baseline: unable to particpate in walking program at eval 08/19/21 INITIAL  4 Pt will Improve LEFS by [place] Baseline:to be evaluated at [facility] 08/19/21 INITIAL                               PLAN: PT FREQUENCY: 1-2x/week   PT DURATION: 6 weeks   PLANNED INTERVENTIONS: Therapeutic exercises, Therapeutic activity, Neuro Muscular re-education, Balance training, Gait training, Patient/Family education, Joint mobilization, Stair training, Aquatic Therapy, Dry Needling, and Manual therapy   PLAN FOR NEXT SESSION: cont progressing balance + strength  PHYSICAL THERAPY DISCHARGE SUMMARY  Visits from Start of Care: 8  Current functional level related to goals / functional outcomes: Improved hip pain   Remaining deficits: None   Education / Equipment: HEP    Patient agrees to discharge. Patient goals were met. Patient is being discharged due to meeting the stated rehab  goals.   Carney Living PT DPT  08/10/2021, 10:22 AM

## 2021-08-14 ENCOUNTER — Encounter: Payer: Self-pay | Admitting: Adult Health

## 2021-08-15 ENCOUNTER — Ambulatory Visit (HOSPITAL_BASED_OUTPATIENT_CLINIC_OR_DEPARTMENT_OTHER): Payer: Medicare HMO | Admitting: Physical Therapy

## 2021-08-16 ENCOUNTER — Ambulatory Visit (INDEPENDENT_AMBULATORY_CARE_PROVIDER_SITE_OTHER): Payer: Medicare HMO

## 2021-08-16 ENCOUNTER — Telehealth: Payer: Self-pay | Admitting: *Deleted

## 2021-08-16 VITALS — Ht 69.0 in | Wt 243.0 lb

## 2021-08-16 DIAGNOSIS — Z0000 Encounter for general adult medical examination without abnormal findings: Secondary | ICD-10-CM | POA: Diagnosis not present

## 2021-08-16 NOTE — Progress Notes (Signed)
Subjective:   [name] [name] is a 70 y.o. male who presents for Medicare Annual/Subsequent preventive examination.  Review of Systems    No ROS  Cardiac Risk Factors include: advanced age (>53men, >64 women)     Objective:    Today's Vitals   08/16/21 0812  Weight: 243 lb (110.2 kg)  Height: 5\' 9"  (1.753 m)   Body mass index is 35.88 kg/m.  Advanced Directives 08/16/2021 07/06/2021 04/08/2021 05/24/2020 11/02/2017 07/12/2017 08/23/2015  Does Patient Have a Medical Advance Directive? Yes Yes Yes Yes No Yes Yes  Type of Advance Directive Living will Living will - Living will - - Living will  Does patient want to make changes to medical advance directive? No - Patient declined - - No - Patient declined - - -  Copy of [place] in Chart? - - - - - - No - copy requested  Would patient like information on creating a medical advance directive? - - - - - - Yes - Educational materials given  Pre-existing out of facility DNR order (yellow form or pink MOST form) - - - - - - -    Current Medications (verified) Outpatient Encounter Medications as of 08/16/2021  Medication Sig   allopurinol (ZYLOPRIM) 100 MG tablet TAKE 1 TABLET EVERY DAY   amitriptyline (ELAVIL) 100 MG tablet Take 1 tablet (100 mg total) by mouth at bedtime.   aspirin 81 MG tablet Take 81 mg by mouth daily.   Cholecalciferol (VITAMIN D PO) Take 1 tablet by mouth daily.   Coenzyme Q10 (CO Q 10 PO) Take by mouth daily.   Cyanocobalamin (VITAMIN B 12 PO) Take by mouth.   DULoxetine (CYMBALTA) 60 MG capsule Take 1 capsule (60 mg total) by mouth daily.   EPINEPHrine 0.3 mg/0.3 mL IJ SOAJ injection INJECT 0.3 MLS INTO THE MUSCLE ONCE   Fremanezumab-vfrm (AJOVY) 225 MG/1.5ML SOAJ Inject 225 mg into the skin every 30 (thirty) days.   gabapentin (NEURONTIN) 300 MG capsule Take 1 capsule (300 mg total) by mouth 2 (two) times daily.   Multiple Vitamin (MULTIVITAMIN) tablet Take 1 tablet by mouth daily.    naproxen (NAPROSYN) 500 MG tablet Take 500 mg by mouth 2 (two) times daily with a meal.   Omega-3 Fatty Acids (FISH OIL) 1000 MG CAPS Take 1 capsule by mouth daily.   ondansetron (ZOFRAN ODT) 4 MG disintegrating tablet Take 1 tablet (4 mg total) by mouth every 4 (four) hours as needed for nausea or vomiting.   oxyCODONE-acetaminophen (PERCOCET) 5-325 MG tablet Take 1-2 tablets by mouth every 6 (six) hours as needed.   pravastatin (PRAVACHOL) 40 MG tablet TAKE 1 TABLET (40 MG TOTAL) BY MOUTH DAILY.   Psyllium (METAMUCIL PO) Take 2 tablets by mouth 2 (two) times daily.   Red Yeast Rice Extract (RED YEAST RICE PO) Take 1 tablet by mouth daily.   Semaglutide,0.25 or 0.5MG /DOS, (OZEMPIC, 0.25 OR 0.5 MG/DOSE,) 2 MG/1.5ML SOPN Inject 0.25 mg into the skin once a week.   SUMAtriptan (IMITREX) 100 MG tablet TAKE 1 TABLET AS NEEDED FOR MIGRAINE. MAY REPEAT IN 2 HOURS IF HEADACHE PERSISTS OR RECURS. MAX 2 TABLETS IN 24 HOURS   tamsulosin (FLOMAX) 0.4 MG CAPS capsule Take 1 capsule (0.4 mg total) by mouth daily.   tamsulosin (FLOMAX) 0.4 MG CAPS capsule Take 1 capsule (0.4 mg total) by mouth daily.   tiZANidine (ZANAFLEX) 4 MG tablet TAKE 1 TABLET THREE TIMES DAILY AS NEEDED FOR MUSCLE SPASM(S)  TURMERIC CURCUMIN PO Take 1,050 mg by mouth daily.   valACYclovir (VALTREX) 500 MG tablet Take 500 mg by mouth daily.   vitamin C (ASCORBIC ACID) 500 MG tablet Take 500 mg by mouth daily.   vitamin E 1000 UNIT capsule Take 1,000 Units by mouth daily.   Facility-Administered Encounter Medications as of 08/16/2021  Medication   0.9 %  sodium chloride infusion    Allergies (verified) Shrimp [shellfish allergy], Codeine, Contrast media [iodinated diagnostic agents], Diazepam, Amoxicillin, Benadryl [diphenhydramine], Depakote [divalproex sodium], Erythromycin, Flexeril [cyclobenzaprine], Allegra [fexofenadine], Betadine [povidone iodine], Celebrex [celecoxib], Meperidine and related, Methocarbamol, Neosporin  [neomycin-bacitracin zn-polymyx], Oxycodone, Penicillins, Plavix [clopidogrel], Rivaroxaban, and Vioxx [rofecoxib]   History: Past Medical History:  Diagnosis Date   ALLERGIC RHINITIS 04/22/2007   Allergy    Cancer ([place])    HX SKIN CANCER   Complication of anesthesia    more concerned about positioning of head & neck because of TMJ   DDD (degenerative disc disease), cervical    GERD (gastroesophageal reflux disease)    infrequently   Headache(784.0)    migraines   History of kidney stones    History of skin cancer    History of TMJ disorder    HYPERCHOLESTEROLEMIA 04/22/2007   HYPERLIPIDEMIA 07/29/2007   Hyperplastic colon polyp    NEPHROLITHIASIS, HX OF 07/29/2007   Osteoarth NOS-Unspec 04/22/2007   Shortness of breath    Shoulder pain, acute    bilateral   Past Surgical History:  Procedure Laterality Date   APPENDECTOMY     BACK SURGERY  10/07/2015   COLONOSCOPY     10 yrs ago- 2 HPP    COLONOSCOPY  2019   JOINT REPLACEMENT     KNEE ARTHROSCOPY     right x2   SHOULDER SURGERY     right   skin cancer biopsy     L forehead   TOTAL KNEE ARTHROPLASTY Right 08/25/2013   Procedure: RIGHT TOTAL KNEE ARTHROPLASTY;  Surgeon: Gearlean Alf, MD;  Location: [facility];  Service: Orthopedics;  Laterality: Right;   TOTAL SHOULDER ARTHROPLASTY Left 12/25/2013   DR SUPPLE    TOTAL SHOULDER ARTHROPLASTY Left 12/25/2013   Procedure: LEFT TOTAL SHOULDER ARTHROPLASTY;  Surgeon: Marin Shutter, MD;  Location: [place];  Service: Orthopedics;  Laterality: Left;   TOTAL SHOULDER ARTHROPLASTY Right 06/18/2014   DR SUPPLE   TOTAL SHOULDER ARTHROPLASTY Right 06/18/2014   Procedure: RIGHT TOTAL SHOULDER ARTHROPLASTY;  Surgeon: Marin Shutter, MD;  Location: [place];  Service: Orthopedics;  Laterality: Right;   WISDOM TOOTH EXTRACTION     Family History  Problem Relation Age of Onset   COPD Father        family hx   Emphysema Father    Aneurysm Mother    Stroke Mother    Sudden death Mother     Cancer Mother    Obesity Mother    Breast cancer Mother    Cancer Maternal Grandfather        lung   Colon cancer Neg Hx    Colon polyps Neg Hx    Social History   Socioeconomic History   Marital status: Married    Spouse name: Not on file   Number of children: 1   Years of education: 16+   Highest education level: Bachelor's degree (e.g., BA, AB, BS)  Occupational History   Occupation: Retired  Tobacco Use   Smoking status: Never   Smokeless tobacco: Never  Vaping Use   Vaping  Use: Never used  Substance and Sexual Activity   Alcohol use: No    Alcohol/week: 0.0 standard drinks   Drug use: No   Sexual activity: Yes    Partners: Female    Birth control/protection: None  Other Topics Concern   Not on file  Social History Narrative   Lives at home w/ his wife   Right-handed   Caffeine: 2 cups of coffee each morning   Social Determinants of Health   Financial Resource Strain: Low Risk    Difficulty of Paying Living Expenses: Not hard at all  Food Insecurity: No Food Insecurity   Worried About Charity fundraiser in the Last Year: Never true   [place] in the Last Year: Never true  Transportation Needs: No Transportation Needs   Lack of Transportation (Medical): No   Lack of Transportation (Non-Medical): No  Physical Activity: Sufficiently Active   Days of Exercise per Week: 5 days   Minutes of Exercise per Session: 70 min  Stress: No Stress Concern Present   Feeling of Stress : Not at all  Social Connections: Moderately Isolated   Frequency of Communication with Friends and Family: More than three times a week   Frequency of Social Gatherings with Friends and Family: More than three times a week   Attends Religious Services: Never   Marine scientist or Organizations: No   Attends Archivist Meetings: Never   Marital Status: Married    Clinical Intake:  Pre-visit preparation completed: YesDiabetic? No Interpreter Needed?:  No Activities of Daily Living In your present state of health, do you have any difficulty performing the following activities: 08/16/2021  Hearing? N  Vision? N  Comment Wears reading glasses  Difficulty concentrating or making decisions? N  Walking or climbing stairs? N  Dressing or bathing? N  Doing errands, shopping? N  Preparing Food and eating ? N  Using the Toilet? N  In the past six months, have you accidently leaked urine? N  Do you have problems with loss of bowel control? N  Managing your Medications? N  Managing your Finances? N  Housekeeping or managing your Housekeeping? N  Some recent data might be hidden    Patient Care Team: [name], MD as PCP - General (Family Medicine) Melvenia Beam, MD as Consulting Physician (Neurology) Starlyn Skeans, MD as Consulting Physician (Family Medicine)  Indicate any recent Medical Services you may have received from other than [facility] providers in the past year (date may be approximate).     Assessment:   This is a routine wellness examination for [name] [name].  Virtual Visit via Telephone Note  I connected with  [name] [name] on 08/16/21 at  8:15 AM EST by telephone and verified that I am speaking with the correct person using two identifiers.  Location:  Patient: Home Provider: Office  Persons participating in the virtual visit: patient/Nurse Health Advisor   I discussed the limitations, risks, security and privacy concerns of performing an evaluation and management service by telephone and the availability of in person appointments. The patient expressed understanding and agreed to proceed.  Interactive audio and video telecommunications were attempted between this nurse and patient, however failed, due to patient having technical difficulties OR patient did not have access to video capability.  We continued and completed visit with audio only.  Some vital signs may be absent or patient reported.   [name], LPN   Hearing/Vision screen  Hearing Screening - Comments:: No difficulty hearing Vision Screening - Comments:: Wears reading glasses. [facility]  Dietary issues and exercise activities discussed: Current Exercise Habits: Home exercise routine, Type of exercise: walking, Time (Minutes): > 60, Frequency (Times/Week): 5, Weekly Exercise (Minutes/Week): 0, Intensity: Moderate   Goals Addressed             This Visit's Progress    Weight (lb) < 169 lb (76.7 kg)   243 lb (110.2 kg)    Will continue your weight loss efforts with [name]  Continue your exercise program.        Depression Screen PHQ 2/9 Scores 08/16/2021 05/24/2020 03/24/2020 07/12/2017 06/18/2017 08/12/2014  PHQ - 2 Score 0 0 0 0 4 0  PHQ- 9 Score - 0 - - 8 -    Fall Risk Fall Risk  08/16/2021 08/15/2021 05/24/2020 03/24/2020 07/12/2017  Falls in the past year? 0 0 0 1 No  Number falls in past yr: 0 - 0 0 -  Injury with Fall? 0 - 0 0 -  Risk for fall due to : - - Medication side effect - -  Follow up - - Falls evaluation completed;Falls prevention discussed - -    FALL RISK PREVENTION PERTAINING TO THE HOME:  Any stairs in or around the home? Yes  If so, are there any without handrails? No  Home free of loose throw rugs in walkways, pet beds, electrical cords, etc? Yes  Adequate lighting in your home to reduce risk of falls? Yes   ASSISTIVE DEVICES UTILIZED TO PREVENT FALLS:  Life alert? No  Use of a cane, walker or w/c? No  Grab bars in the bathroom? Yes  Shower chair or bench in shower? Yes  Elevated toilet seat or a handicapped toilet? No   TIMED UP AND GO:  Was the test performed? No . Audio Visit  Cognitive Function: MMSE - Mini Mental State Exam 07/12/2017  Not completed: (No Data)    Immunizations Immunization History  Administered Date(s) Administered   Fluad Quad(high Dose 65+) 06/19/2019, 06/15/2020, 06/16/2021   Influenza Split 07/10/2012   Influenza Whole  09/25/2005, 07/22/2009   Influenza, High Dose Seasonal PF 06/30/2016, 06/12/2017, 06/20/2018   Influenza,inj,Quad PF,6+ Mos 06/10/2013, 06/10/2014, 06/16/2015   Influenza-Unspecified 07/10/2012, 06/10/2013, 06/10/2014, 06/16/2015   Moderna Sars-Covid-2 Vaccination 10/25/2019, 11/22/2019, 07/19/2020, 02/12/2021   Pneumococcal Conjugate-13 06/02/2016   Pneumococcal Polysaccharide-23 07/12/2017   Tdap 03/27/2011   Zoster, Live 06/02/2016    Screening Tests Health Maintenance  Topic Date Due   COVID-19 Vaccine (5 - Booster for Moderna series) 09/01/2021 (Originally 04/09/2021)   Zoster Vaccines- Shingrix (1 of 2) 11/16/2021 (Originally 03/15/1970)   TETANUS/TDAP  08/16/2022 (Originally 03/26/2021)   Hepatitis C Screening  08/16/2022 (Originally 03/15/1969)   COLONOSCOPY (Pts 45-37yrs Insurance coverage will need to be confirmed)  11/03/2027   Pneumonia Vaccine 55+ Years old  Completed   INFLUENZA VACCINE  Completed   HPV VACCINES  Aged Out    Health Maintenance  There are no preventive care reminders to display for this patient.   Vision Screening: Recommended annual ophthalmology exams for early detection of glaucoma and other disorders of the eye. Is the patient up to date with their annual eye exam?  Yes  Who is the provider or what is the name of the office in which the patient attends annual eye exams? Followed by [facility]  Dental Screening: Recommended annual dental exams for proper oral hygiene  [employer]  Referral / Chronic Care Management:   CRR required this visit?  No   CCM required this visit?  No      Plan:     I have personally reviewed and noted the following in the patient's chart:   Medical and social history Use of alcohol, tobacco or illicit drugs  Current medications and supplements including opioid prescriptions. Patient is currently taking opioid prescriptions. Information provided to patient regarding non-opioid alternatives. Patient  advised to discuss non-opioid treatment plan with their provider. Functional ability and status Nutritional status Physical activity Advanced directives List of other physicians Hospitalizations, surgeries, and ER visits in previous 12 months Vitals Screenings to include cognitive, depression, and falls Referrals and appointments  In addition, I have reviewed and discussed with patient certain preventive protocols, quality metrics, and best practice recommendations. A written personalized care plan for preventive services as well as general preventive health recommendations were provided to patient.     [name], LPN   [id number]/79/[id number]

## 2021-08-16 NOTE — Telephone Encounter (Signed)
Did PA for Ajovy over the phome with [employer] . Marland KitchenHumana states that it will take 24-72 hours for approval. Did call patient to make him aware . Patient thanked me for calling him.,   Ref# [id number]     Pt is using [employer] to purchase Ajovy

## 2021-08-16 NOTE — Patient Instructions (Addendum)
[name] [name] , Thank you for taking time to come for your Medicare Wellness Visit. I appreciate your ongoing commitment to your health goals. Please review the following plan we discussed and let me know if I can assist you in the future.   These are the goals we discussed:  Goals      Patient Stated     I will continue to walk 5 miles per day.     Weight (lb) < 169 lb (76.7 kg)     Will continue your weight loss efforts with [name] [name]  Continue your exercise program.         This is a list of the screening recommended for you and due dates:  Health Maintenance  Topic Date Due   COVID-19 Vaccine (5 - Booster for Moderna series) 09/01/2021*   Zoster (Shingles) Vaccine (1 of 2) 11/16/2021*   Tetanus Vaccine  08/16/2022*   Hepatitis C Screening: USPSTF Recommendation to screen - Ages 18-79 yo.  08/16/2022*   Colon Cancer Screening  11/03/2027   Pneumonia Vaccine  Completed   Flu Shot  Completed   HPV Vaccine  Aged Out  *Topic was postponed. The date shown is not the original due date.  Advanced directives: Yes  Conditions/risks identified: None  Next appointment: Follow up in one year for your annual wellness visit.   Preventive Care 68 Years and Older, Male Preventive care refers to lifestyle choices and visits with your health care provider that can promote health and wellness. What does preventive care include? A yearly physical exam. This is also called an annual well check. Dental exams once or twice a year. Routine eye exams. Ask your health care provider how often you should have your eyes checked. Personal lifestyle choices, including: Daily care of your teeth and gums. Regular physical activity. Eating a healthy diet. Avoiding tobacco and drug use. Limiting alcohol use. Practicing safe sex. Taking low doses of aspirin every day. Taking vitamin and mineral supplements as recommended by your health care provider. What happens during an annual well check? The  services and screenings done by your health care provider during your annual well check will depend on your age, overall health, lifestyle risk factors, and family history of disease. Counseling  Your health care provider may ask you questions about your: Alcohol use. Tobacco use. Drug use. Emotional well-being. Home and relationship well-being. Sexual activity. Eating habits. History of falls. Memory and ability to understand (cognition). Work and work Statistician. Screening  You may have the following tests or measurements: Height, weight, and BMI. Blood pressure. Lipid and cholesterol levels. These may be checked every 5 years, or more frequently if you are over 11 years old. Skin check. Lung cancer screening. You may have this screening every year starting at age 70 if you have a 30-pack-year history of smoking and currently smoke or have quit within the past 15 years. Fecal occult blood test (FOBT) of the stool. You may have this test every year starting at age 76. Flexible sigmoidoscopy or colonoscopy. You may have a sigmoidoscopy every 5 years or a colonoscopy every 10 years starting at age 19. Prostate cancer screening. Recommendations will vary depending on your family history and other risks. Hepatitis C blood test. Hepatitis B blood test. Sexually transmitted disease (STD) testing. Diabetes screening. This is done by checking your blood sugar (glucose) after you have not eaten for a while (fasting). You may have this done every 1-3 years. Abdominal aortic aneurysm (AAA) screening. You  may need this if you are a current or former smoker. Osteoporosis. You may be screened starting at age 49 if you are at high risk. Talk with your health care provider about your test results, treatment options, and if necessary, the need for more tests. Vaccines  Your health care provider may recommend certain vaccines, such as: Influenza vaccine. This is recommended every year. Tetanus,  diphtheria, and acellular pertussis (Tdap, Td) vaccine. You may need a Td booster every 10 years. Zoster vaccine. You may need this after age 71. Pneumococcal 13-valent conjugate (PCV13) vaccine. One dose is recommended after age 73. Pneumococcal polysaccharide (PPSV23) vaccine. One dose is recommended after age 59. Talk to your health care provider about which screenings and vaccines you need and how often you need them. This information is not intended to replace advice given to you by your health care provider. Make sure you discuss any questions you have with your health care provider. Document Released: 10/08/2015 Document Revised: 05/31/2016 Document Reviewed: 07/13/2015 Elsevier Interactive Patient Education  2017 [place] Prevention in the Home Falls can cause injuries. They can happen to people of all ages. There are many things you can do to make your home safe and to help prevent falls. What can I do on the outside of my home? Regularly fix the edges of walkways and driveways and fix any cracks. Remove anything that might make you trip as you walk through a door, such as a raised step or threshold. Trim any bushes or trees on the path to your home. Use bright outdoor lighting. Clear any walking paths of anything that might make someone trip, such as rocks or tools. Regularly check to see if handrails are loose or broken. Make sure that both sides of any steps have handrails. Any raised decks and porches should have guardrails on the edges. Have any leaves, snow, or ice cleared regularly. Use sand or salt on walking paths during winter. Clean up any spills in your garage right away. This includes oil or grease spills. What can I do in the bathroom? Use night lights. Install grab bars by the toilet and in the tub and shower. Do not use towel bars as grab bars. Use non-skid mats or decals in the tub or shower. If you need to sit down in the shower, use a plastic,  non-slip stool. Keep the floor dry. Clean up any water that spills on the floor as soon as it happens. Remove soap buildup in the tub or shower regularly. Attach bath mats securely with double-sided non-slip rug tape. Do not have throw rugs and other things on the floor that can make you trip. What can I do in the bedroom? Use night lights. Make sure that you have a light by your bed that is easy to reach. Do not use any sheets or blankets that are too big for your bed. They should not hang down onto the floor. Have a firm chair that has side arms. You can use this for support while you get dressed. Do not have throw rugs and other things on the floor that can make you trip. What can I do in the kitchen? Clean up any spills right away. Avoid walking on wet floors. Keep items that you use a lot in easy-to-reach places. If you need to reach something above you, use a strong step stool that has a grab bar. Keep electrical cords out of the way. Do not use floor polish or wax that  makes floors slippery. If you must use wax, use non-skid floor wax. Do not have throw rugs and other things on the floor that can make you trip. What can I do with my stairs? Do not leave any items on the stairs. Make sure that there are handrails on both sides of the stairs and use them. Fix handrails that are broken or loose. Make sure that handrails are as long as the stairways. Check any carpeting to make sure that it is firmly attached to the stairs. Fix any carpet that is loose or worn. Avoid having throw rugs at the top or bottom of the stairs. If you do have throw rugs, attach them to the floor with carpet tape. Make sure that you have a light switch at the top of the stairs and the bottom of the stairs. If you do not have them, ask someone to add them for you. What else can I do to help prevent falls? Wear shoes that: Do not have high heels. Have rubber bottoms. Are comfortable and fit you well. Are closed  at the toe. Do not wear sandals. If you use a stepladder: Make sure that it is fully opened. Do not climb a closed stepladder. Make sure that both sides of the stepladder are locked into place. Ask someone to hold it for you, if possible. Clearly mark and make sure that you can see: Any grab bars or handrails. First and last steps. Where the edge of each step is. Use tools that help you move around (mobility aids) if they are needed. These include: Canes. Walkers. Scooters. Crutches. Turn on the lights when you go into a dark area. Replace any light bulbs as soon as they burn out. Set up your furniture so you have a clear path. Avoid moving your furniture around. If any of your floors are uneven, fix them. If there are any pets around you, be aware of where they are. Review your medicines with your doctor. Some medicines can make you feel dizzy. This can increase your chance of falling. Ask your doctor what other things that you can do to help prevent falls. This information is not intended to replace advice given to you by your health care provider. Make sure you discuss any questions you have with your health care provider. Document Released: 07/08/2009 Document Revised: 02/17/2016 Document Reviewed: 10/16/2014 Elsevier Interactive Patient Education  2017 [place]  Opioid Pain Medicine Management Opioids are powerful medicines that are used to treat moderate to severe pain. When used for short periods of time, they can help you to: Sleep better. Do better in physical or occupational therapy. Feel better in the first few days after an injury. Recover from surgery. Opioids should be taken with the supervision of a trained health care provider. They should be taken for the shortest period of time possible. This is because opioids can be addictive, and the longer you take opioids, the greater your risk of addiction. This addiction can also be called opioid use disorder. What are the  risks? Using opioid pain medicines for longer than 3 days increases your risk of side effects. Side effects include: Constipation. Nausea and vomiting. Breathing difficulties (respiratory depression). Drowsiness. Confusion. Opioid use disorder. Itching. Taking opioid pain medicine for a long period of time can affect your ability to do daily tasks. It also puts you at risk for: Motor vehicle crashes. Depression. Suicide. Heart attack. Overdose, which can be life-threatening. What is a pain treatment plan? A pain treatment  plan is an agreement between you and your health care provider. Pain is unique to each person, and treatments vary depending on your condition. To manage your pain, you and your health care provider need to work together. To help you do this: Discuss the goals of your treatment, including how much pain you might expect to have and how you will manage the pain. Review the risks and benefits of taking opioid medicines. Remember that a good treatment plan uses more than one approach and minimizes the chance of side effects. Be honest about the amount of medicines you take and about any drug or alcohol use. Get pain medicine prescriptions from only one health care provider. Pain can be managed with many types of alternative treatments. Ask your health care provider to refer you to one or more specialists who can help you manage pain through: Physical or occupational therapy. Counseling (cognitive behavioral therapy). Good nutrition. Biofeedback. Massage. Meditation. Non-opioid medicine. Following a gentle exercise program. How to use opioid pain medicine Taking medicine Take your pain medicine exactly as told by your health care provider. Take it only when you need it. If your pain gets less severe, you may take less than your prescribed dose if your health care provider approves. If you are not having pain, do nottake pain medicine unless your health care provider  tells you to take it. If your pain is severe, do nottry to treat it yourself by taking more pills than instructed on your prescription. Contact your health care provider for help. Write down the times when you take your pain medicine. It is easy to become confused while on pain medicine. Writing the time can help you avoid overdose. Take other over-the-counter or prescription medicines only as told by your health care provider. Keeping yourself and others safe  While you are taking opioid pain medicine: Do not drive, use machinery, or power tools. Do not sign legal documents. Do not drink alcohol. Do not take sleeping pills. Do not supervise children by yourself. Do not do activities that require climbing or being in high places. Do not go to a lake, river, ocean, spa, or swimming pool. Do not share your pain medicine with anyone. Keep pain medicine in a locked cabinet or in a secure area where pets and children cannot reach it. Stopping your use of opioids If you have been taking opioid medicine for more than a few weeks, you may need to slowly decrease (taper) how much you take until you stop completely. Tapering your use of opioids can decrease your risk of symptoms of withdrawal, such as: Pain and cramping in the abdomen. Nausea. Sweating. Sleepiness. Restlessness. Uncontrollable shaking (tremors). Cravings for the medicine. Do not attempt to taper your use of opioids on your own. Talk with your health care provider about how to do this. Your health care provider may prescribe a step-down schedule based on how much medicine you are taking and how long you have been taking it. Getting rid of leftover pills Do not save any leftover pills. Get rid of leftover pills safely by: Taking the medicine to a prescription take-back program. This is usually offered by the county or law enforcement. Bringing them to a pharmacy that has a drug disposal container. Flushing them down the toilet.  Check the label or package insert of your medicine to see whether this is safe to do. Throwing them out in the trash. Check the label or package insert of your medicine to see whether this is  safe to do. If it is safe to throw it out, remove the medicine from the original container, put it into a sealable bag or container, and mix it with used coffee grounds, food scraps, dirt, or cat litter before putting it in the trash. Follow these instructions at home: Activity Do exercises as told by your health care provider. Avoid activities that make your pain worse. Return to your normal activities as told by your health care provider. Ask your health care provider what activities are safe for you. General instructions You may need to take these actions to prevent or treat constipation: Drink enough fluid to keep your urine pale yellow. Take over-the-counter or prescription medicines. Eat foods that are high in fiber, such as beans, whole grains, and fresh fruits and vegetables. Limit foods that are high in fat and processed sugars, such as fried or sweet foods. Keep all follow-up visits. This is important. Where to find support If you have been taking opioids for a long time, you may benefit from receiving support for quitting from a local support group or counselor. Ask your health care provider for a referral to these resources in your area. Where to find more information Centers for Disease Control and Prevention (CDC): http://www.wolf.info/ U.S. Food and Drug Administration (FDA): [place] Get help right away if: You may have taken too much of an opioid (overdosed). Common symptoms of an overdose: Your breathing is slower or more shallow than normal. You have a very slow heartbeat (pulse). You have slurred speech. You have nausea and vomiting. Your pupils become very small. You have other potential symptoms: You are very confused. You faint or feel like you will faint. You have cold, clammy  skin. You have blue lips or fingernails. You have thoughts of harming yourself or harming others. These symptoms may represent a serious problem that is an emergency. Do not wait to see if the symptoms will go away. Get medical help right away. Call your local emergency services (911 in the [place] Do not drive yourself to the hospital.  If you ever feel like you may hurt yourself or others, or have thoughts about taking your own life, get help right away. Go to your nearest emergency department or: Call your local emergency services (911 in the [place] Call the [facility] 308-067-3803 in the [place].). Call a suicide crisis helpline, such as the [place] at 419-036-6311 or 988 in the [place]. This is open 24 hours a day in the [place] Text the Crisis Text Line at (915)868-6553 (in the [place].). Summary Opioid medicines can help you manage moderate to severe pain for a short period of time. A pain treatment plan is an agreement between you and your health care provider. Discuss the goals of your treatment, including how much pain you might expect to have and how you will manage the pain. If you think that you or someone else may have taken too much of an opioid, get medical help right away. This information is not intended to replace advice given to you by your health care provider. Make sure you discuss any questions you have with your health care provider. Document Revised: 04/06/2021 Document Reviewed: 12/22/2020 Elsevier Patient Education  [place].

## 2021-08-17 ENCOUNTER — Encounter (HOSPITAL_BASED_OUTPATIENT_CLINIC_OR_DEPARTMENT_OTHER): Payer: Medicare HMO | Admitting: Physical Therapy

## 2021-08-20 ENCOUNTER — Other Ambulatory Visit: Payer: Self-pay | Admitting: Family Medicine

## 2021-08-23 ENCOUNTER — Ambulatory Visit (HOSPITAL_BASED_OUTPATIENT_CLINIC_OR_DEPARTMENT_OTHER): Payer: Medicare HMO | Admitting: Physical Therapy

## 2021-08-23 ENCOUNTER — Telehealth (INDEPENDENT_AMBULATORY_CARE_PROVIDER_SITE_OTHER): Payer: Self-pay | Admitting: Family Medicine

## 2021-08-23 NOTE — Telephone Encounter (Signed)
Approved through 09/24/22.

## 2021-08-23 NOTE — Telephone Encounter (Signed)
Patient says [facility] is shipping his Ozempic medication here because they will not ship to him.

## 2021-08-25 ENCOUNTER — Encounter: Payer: Medicare HMO | Admitting: Family Medicine

## 2021-08-26 ENCOUNTER — Encounter (HOSPITAL_BASED_OUTPATIENT_CLINIC_OR_DEPARTMENT_OTHER): Payer: Medicare HMO | Admitting: Physical Therapy

## 2021-08-29 ENCOUNTER — Encounter (INDEPENDENT_AMBULATORY_CARE_PROVIDER_SITE_OTHER): Payer: Self-pay | Admitting: Family Medicine

## 2021-08-29 ENCOUNTER — Ambulatory Visit (INDEPENDENT_AMBULATORY_CARE_PROVIDER_SITE_OTHER): Payer: Medicare HMO | Admitting: Family Medicine

## 2021-08-29 ENCOUNTER — Other Ambulatory Visit: Payer: Self-pay

## 2021-08-29 VITALS — BP 131/79 | HR 84 | Temp 98.1°F | Ht 69.0 in | Wt 237.0 lb

## 2021-08-29 DIAGNOSIS — Z6835 Body mass index (BMI) 35.0-35.9, adult: Secondary | ICD-10-CM | POA: Diagnosis not present

## 2021-08-29 DIAGNOSIS — R7303 Prediabetes: Secondary | ICD-10-CM

## 2021-08-29 NOTE — Progress Notes (Signed)
Chief Complaint:   OBESITY [name] [name] is here to discuss his progress with his obesity treatment plan along with follow-up of his obesity related diagnoses. [name] [name] is on the Category 3 Plan and states he is following his eating plan approximately 50% of the time. [name] [name] states he is walking and biking for 90 minutes 4 times per week.  Today's visit was #: 30 Starting weight: 230 lbs Starting date: 06/18/2017 Today's weight: 237 lbs Today's date: 08/29/2021 Total lbs lost to date: 0 Total lbs lost since last in-office visit: 1  Interim History: [name] [name] has avoided holiday weight gained but he is disappointed that he didn't lose more weight. He especially struggles with PM snacking. He has been doing physical therapy and exercising 4 times per week.  Subjective:   1. Prediabetes [name] [name] was prescribed Ozempic and he got the approval for [employer]. We have not received the meds yet.  Assessment/Plan:   1. Prediabetes [name] [name] is to contact the drug company, and he agreed that we could store it in my normal fridge as he will pick it up the same day. He will continue to work on weight loss, exercise, and decreasing simple carbohydrates to help decrease the risk of diabetes.   2. Class 2 severe obesity with serious comorbidity and body mass index (BMI) of 35.0 to 35.9 in adult, unspecified obesity type [name] [name] [name]) [name] [name] is currently in the action stage of change. As such, his goal is to continue with weight loss efforts. He has agreed to the Category 3 Plan.   Exercise goals: As is.  Behavioral modification strategies: increasing lean protein intake.  [name] [name] has agreed to follow-up with our clinic in 5 weeks. He was informed of the importance of frequent follow-up visits to maximize his success with intensive lifestyle modifications for his multiple health conditions.   Objective:   Blood pressure 131/79, pulse 84, temperature 98.1 F (36.7 C), height 5\' 9"  (1.753 m), weight 237 lb  (107.5 kg), SpO2 97 %. Body mass index is 35 kg/m.  General: Cooperative, alert, well developed, in no acute distress. HEENT: Conjunctivae and lids unremarkable. Cardiovascular: Regular rhythm.  Lungs: Normal work of breathing. Neurologic: No focal deficits.   Lab Results  Component Value Date   CREATININE 1.24 04/18/2021   BUN 24 (H) 04/18/2021   NA 137 04/18/2021   K 4.5 04/18/2021   CL 103 04/18/2021   CO2 27 04/18/2021   Lab Results  Component Value Date   ALT 34 04/18/2021   AST 25 04/18/2021   ALKPHOS 43 04/18/2021   BILITOT 0.4 04/18/2021   Lab Results  Component Value Date   HGBA1C 5.3 03/15/2020   HGBA1C 5.7 (H) 06/19/2019   HGBA1C 5.5 11/21/2017   HGBA1C 5.8 (H) 06/18/2017   Lab Results  Component Value Date   INSULIN 5.0 03/15/2020   INSULIN 8.5 06/19/2019   INSULIN 8.6 11/21/2017   INSULIN 16.5 06/18/2017   Lab Results  Component Value Date   TSH 2.80 04/18/2021   Lab Results  Component Value Date   CHOL 250 (H) 06/09/2020   HDL 93 06/09/2020   LDLCALC 142 (H) 06/09/2020   LDLDIRECT 135.0 07/16/2013   TRIG 59 06/09/2020   CHOLHDL 2.7 06/09/2020   Lab Results  Component Value Date   VD25OH 89.1 03/15/2020   VD25OH 78.1 06/19/2019   VD25OH 59.6 11/21/2017   Lab Results  Component Value Date   WBC 4.6 04/18/2021   HGB 15.3 04/18/2021   HCT  44.5 04/18/2021   MCV 96.2 04/18/2021   PLT 225.0 04/18/2021   No results found for: IRON, TIBC, FERRITIN  Attestation Statements:   Reviewed by clinician on day of visit: allergies, medications, problem list, medical history, surgical history, family history, social history, and previous encounter notes.   I, Trixie Dredge, am acting as transcriptionist for Dennard Nip, MD.  I have reviewed the above documentation for accuracy and completeness, and I agree with the above. -  Dennard Nip, MD

## 2021-09-01 ENCOUNTER — Encounter (INDEPENDENT_AMBULATORY_CARE_PROVIDER_SITE_OTHER): Payer: Self-pay | Admitting: Family Medicine

## 2021-09-01 NOTE — Telephone Encounter (Signed)
Last OV with Dr. Beasley 

## 2021-09-05 ENCOUNTER — Telehealth: Payer: Self-pay | Admitting: Family Medicine

## 2021-09-05 NOTE — Telephone Encounter (Signed)
Patient calling in with respiratory symptoms: Shortness of breath, chest pain, palpitations or other red words send to Triage  Does the patient have a fever over 100, cough, congestion, sore throat, runny nose, lost of taste/smell (please list symptoms that patient has) coughing and heavy congestion x 6 days What date did symptoms start?08-30-2021 (If over 5 days ago, pt may be scheduled for in person visit)  Have you tested for Covid in the last 5 days? No   If yes, was it positive []  OR negative [] ? If positive in the last 5 days, please schedule virtual visit now. If negative, schedule for an in person OV with the next available provider if PCP has no openings. Please also let patient know they will be tested again (follow the script below)  "you will have to arrive 25mins prior to your appt time to be Covid tested. Please park in back of office at the [facility] & call [phone] to let the staff know you have arrived. A staff member will meet you at your car to do a rapid covid test. Once the test has resulted you will be notified by phone of your results to determine if appt will remain an in person visit or be converted to a virtual/phone visit. If you arrive less than 47mins before your appt time, your visit will be automatically converted to virtual & any recommended testing will happen AFTER the visit." Pt has an appt with dr banks on 09-07-2021  THINGS TO REMEMBER  If no availability for virtual visit in office,  please schedule another [place] office  If no availability at another [place] office, please instruct patient that they can schedule an evisit or virtual visit through their mychart account. Visits up to 8pm  patients can be seen in office 5 days after positive COVID test

## 2021-09-05 NOTE — Telephone Encounter (Signed)
Pt has an appt with dr banks on 09-07-2021

## 2021-09-05 NOTE — Telephone Encounter (Signed)
Pt is calling and does not want to talk with access nurse. Pt is having congestion in his lungs and head since last Wednesday and would like some advise. Pt will make an virtual tomorrow with dr Maudie Mercury if [employer] [facility]. [place], [place] - [address]

## 2021-09-05 NOTE — Telephone Encounter (Signed)
Please see message . Thank you .

## 2021-09-06 ENCOUNTER — Encounter: Payer: Self-pay | Admitting: Physician Assistant

## 2021-09-06 ENCOUNTER — Encounter (INDEPENDENT_AMBULATORY_CARE_PROVIDER_SITE_OTHER): Payer: Self-pay

## 2021-09-06 ENCOUNTER — Ambulatory Visit (INDEPENDENT_AMBULATORY_CARE_PROVIDER_SITE_OTHER): Payer: Medicare HMO | Admitting: Physician Assistant

## 2021-09-06 ENCOUNTER — Other Ambulatory Visit: Payer: Self-pay

## 2021-09-06 VITALS — BP 146/82 | HR 79 | Temp 98.2°F | Ht 69.0 in | Wt 244.2 lb

## 2021-09-06 DIAGNOSIS — R051 Acute cough: Secondary | ICD-10-CM

## 2021-09-06 MED ORDER — PREDNISONE 20 MG PO TABS: 20.0000 mg | ORAL_TABLET | Freq: Every day | ORAL | 0 refills | Status: DC

## 2021-09-06 MED ORDER — DOXYCYCLINE HYCLATE 100 MG PO TABS: 100.0000 mg | ORAL_TABLET | Freq: Two times a day (BID) | ORAL | 0 refills | Status: DC

## 2021-09-06 MED ORDER — BENZONATATE 200 MG PO CAPS: 200.0000 mg | ORAL_CAPSULE | Freq: Two times a day (BID) | ORAL | 0 refills | Status: DC | PRN

## 2021-09-06 NOTE — Patient Instructions (Signed)
It was great to see you!  Use medication as prescribed: doxycycline, prednisone, tessalon perles  Push fluids and get plenty of rest. Please return if you are not improving as expected, or if you have high fevers (>101.5) or difficulty swallowing or worsening productive cough.  Call clinic with questions.  I hope you start feeling better soon!

## 2021-09-06 NOTE — Progress Notes (Signed)
[name] [name] is a 70 y.o. male here for cough.  History of Present Illness:   Chief Complaint  Patient presents with   Cough    Pt c/o cough w/ thick mucus > 7 days, congestion, and causing migraine to start now; Pt neg covid test on Saturday, no energy extreme fatigue today, shortness of breath    HPI  Cough [name] [name] presents with c/o nasal congestion and cough with thick sputum that has been onset for a week. States that sx have gotten so intense that they have caused him migraines and to wake up in the middle of the night. Due to this, he believed his sx were consistent with covid but upon getting tested on 09/03/21, he was negative. Patient did admit he was experiencing a sore throat along with current sx but this has since resolved itself.   [name] [name] hasn't been seen for this issue, instead he tried to take allegra, robitussin, zicam, and delsym as well as pushing fluids which provided minor relief. Denies fever, chills, CP, SOB, loss of appetite, recent sick contact, extreme fatigue, SOB, hemoptysis, or hx of asthma/COPD/pneumonia.  Past Medical History:  Diagnosis Date   ALLERGIC RHINITIS 04/22/2007   Allergy    Cancer ([place])    HX SKIN CANCER   Complication of anesthesia    more concerned about positioning of head & neck because of TMJ   DDD (degenerative disc disease), cervical    GERD (gastroesophageal reflux disease)    infrequently   Headache(784.0)    migraines   History of kidney stones    History of skin cancer    History of TMJ disorder    HYPERCHOLESTEROLEMIA 04/22/2007   HYPERLIPIDEMIA 07/29/2007   Hyperplastic colon polyp    NEPHROLITHIASIS, HX OF 07/29/2007   Osteoarth NOS-Unspec 04/22/2007   Shortness of breath    Shoulder pain, acute    bilateral     Social History   Tobacco Use   Smoking status: Never   Smokeless tobacco: Never  Vaping Use   Vaping Use: Never used  Substance Use Topics   Alcohol use: No    Alcohol/week: 0.0 standard drinks    Drug use: No    Past Surgical History:  Procedure Laterality Date   APPENDECTOMY     BACK SURGERY  10/07/2015   COLONOSCOPY     10 yrs ago- 2 HPP    COLONOSCOPY  2019   JOINT REPLACEMENT     KNEE ARTHROSCOPY     right x2   SHOULDER SURGERY     right   skin cancer biopsy     L forehead   TOTAL KNEE ARTHROPLASTY Right 08/25/2013   Procedure: RIGHT TOTAL KNEE ARTHROPLASTY;  Surgeon: Gearlean Alf, MD;  Location: [facility];  Service: Orthopedics;  Laterality: Right;   TOTAL SHOULDER ARTHROPLASTY Left 12/25/2013   DR SUPPLE    TOTAL SHOULDER ARTHROPLASTY Left 12/25/2013   Procedure: LEFT TOTAL SHOULDER ARTHROPLASTY;  Surgeon: Marin Shutter, MD;  Location: [facility];  Service: Orthopedics;  Laterality: Left;   TOTAL SHOULDER ARTHROPLASTY Right 06/18/2014   DR SUPPLE   TOTAL SHOULDER ARTHROPLASTY Right 06/18/2014   Procedure: RIGHT TOTAL SHOULDER ARTHROPLASTY;  Surgeon: Marin Shutter, MD;  Location: [facility];  Service: Orthopedics;  Laterality: Right;   WISDOM TOOTH EXTRACTION      Family History  Problem Relation Age of Onset   COPD Father        family hx   Emphysema Father  Aneurysm Mother    Stroke Mother    Sudden death Mother    Cancer Mother    Obesity Mother    Breast cancer Mother    Cancer Maternal Grandfather        lung   Colon cancer Neg Hx    Colon polyps Neg Hx     Allergies  Allergen Reactions   Shrimp [Shellfish Allergy] Anaphylaxis and Rash   Codeine Rash and Other (See Comments)    Blacked out   Contrast Media [Iodinated Diagnostic Agents] Rash   Diazepam Rash    Rash over whole body from neck down, and he lost his voice (airway compromise?)   Amoxicillin Other (See Comments)    Unknown    Benadryl [Diphenhydramine]     With dyes    Depakote [Divalproex Sodium] Swelling    Per pts report (given by Dr. Inda Merlin).  Has throat swelling and rash.   Erythromycin Other (See Comments)    unknown   Flexeril [Cyclobenzaprine]    Allegra [Fexofenadine] Rash         Betadine [Povidone Iodine] Rash   Celebrex [Celecoxib] Rash   Meperidine And Related Rash   Methocarbamol Rash   Neosporin [Neomycin-Bacitracin Zn-Polymyx] Rash   Oxycodone Rash   Penicillins Rash   Plavix [Clopidogrel] Rash   Rivaroxaban Rash   Vioxx [Rofecoxib] Rash    Current Medications:   Current Outpatient Medications:    allopurinol (ZYLOPRIM) 100 MG tablet, TAKE 1 TABLET EVERY DAY, Disp: 90 tablet, Rfl: 1   amitriptyline (ELAVIL) 100 MG tablet, Take 1 tablet (100 mg total) by mouth at bedtime., Disp: 90 tablet, Rfl: 3   aspirin 81 MG tablet, Take 81 mg by mouth daily., Disp: , Rfl:    Cholecalciferol (VITAMIN D PO), Take 1 tablet by mouth daily., Disp: , Rfl:    Coenzyme Q10 (CO Q 10 PO), Take by mouth daily., Disp: , Rfl:    Cyanocobalamin (VITAMIN B 12 PO), Take by mouth., Disp: , Rfl:    DULoxetine (CYMBALTA) 60 MG capsule, Take 1 capsule (60 mg total) by mouth daily., Disp: 90 capsule, Rfl: 3   EPINEPHrine 0.3 mg/0.3 mL IJ SOAJ injection, INJECT 0.3 MLS INTO THE MUSCLE ONCE, Disp: 2 mL, Rfl: 2   Fremanezumab-vfrm (AJOVY) 225 MG/1.5ML SOAJ, Inject 225 mg into the skin every 30 (thirty) days., Disp: 3 pen, Rfl: 0   gabapentin (NEURONTIN) 300 MG capsule, Take 1 capsule (300 mg total) by mouth 2 (two) times daily., Disp: 180 capsule, Rfl: 3   Multiple Vitamin (MULTIVITAMIN) tablet, Take 1 tablet by mouth daily., Disp: , Rfl:    naproxen (NAPROSYN) 500 MG tablet, Take 500 mg by mouth 2 (two) times daily with a meal., Disp: , Rfl:    Omega-3 Fatty Acids (FISH OIL) 1000 MG CAPS, Take 1 capsule by mouth daily., Disp: , Rfl:    ondansetron (ZOFRAN ODT) 4 MG disintegrating tablet, Take 1 tablet (4 mg total) by mouth every 4 (four) hours as needed for nausea or vomiting., Disp: 20 tablet, Rfl: 0   oxyCODONE-acetaminophen (PERCOCET) 5-325 MG tablet, Take 1-2 tablets by mouth every 6 (six) hours as needed., Disp: 20 tablet, Rfl: 0   pravastatin (PRAVACHOL) 40 MG tablet, TAKE 1  TABLET (40 MG TOTAL) BY MOUTH DAILY., Disp: 90 tablet, Rfl: 3   Psyllium (METAMUCIL PO), Take 2 tablets by mouth 2 (two) times daily., Disp: , Rfl:    Red Yeast Rice Extract (RED YEAST RICE PO), Take 1 tablet  by mouth daily., Disp: , Rfl:    Semaglutide,0.25 or 0.5MG /DOS, (OZEMPIC, 0.25 OR 0.5 MG/DOSE,) 2 MG/1.5ML SOPN, Inject 0.25 mg into the skin once a week., Disp: 1.5 mL, Rfl: 0   SUMAtriptan (IMITREX) 100 MG tablet, TAKE 1 TABLET AS NEEDED FOR MIGRAINE. MAY REPEAT IN 2 HOURS IF HEADACHE PERSISTS OR RECURS. MAX 2 TABLETS IN 24 HOURS, Disp: 27 tablet, Rfl: 3   tamsulosin (FLOMAX) 0.4 MG CAPS capsule, Take 1 capsule (0.4 mg total) by mouth daily., Disp: 14 capsule, Rfl: 0   tamsulosin (FLOMAX) 0.4 MG CAPS capsule, Take 1 capsule (0.4 mg total) by mouth daily., Disp: 30 capsule, Rfl: 0   tiZANidine (ZANAFLEX) 4 MG tablet, TAKE 1 TABLET THREE TIMES DAILY AS NEEDED FOR MUSCLE SPASM(S), Disp: 270 tablet, Rfl: 3   TURMERIC CURCUMIN PO, Take 1,050 mg by mouth daily., Disp: , Rfl:    valACYclovir (VALTREX) 500 MG tablet, Take 500 mg by mouth daily., Disp: , Rfl:    vitamin C (ASCORBIC ACID) 500 MG tablet, Take 500 mg by mouth daily., Disp: , Rfl:    vitamin E 1000 UNIT capsule, Take 1,000 Units by mouth daily., Disp: , Rfl:   Current Facility-Administered Medications:    0.9 %  sodium chloride infusion, 500 mL, Intravenous, Once, Ladene Artist, MD   Review of Systems:   ROS Negative unless otherwise specified per HPI.  Vitals:   Vitals:   09/06/21 1336  BP: (!) 146/82  Pulse: 79  Temp: 98.2 F (36.8 C)  TempSrc: Temporal  SpO2: 94%  Weight: 244 lb 3.2 oz (110.8 kg)  Height: 5\' 9"  (1.753 m)     Body mass index is 36.06 kg/m.  Physical Exam:   Physical Exam Vitals and nursing note reviewed.  Constitutional:      General: He is not in acute distress.    Appearance: He is well-developed. He is not ill-appearing or toxic-appearing.  Cardiovascular:     Rate and Rhythm: Normal  rate and regular rhythm.     Pulses: Normal pulses.     Heart sounds: Normal heart sounds, S1 normal and S2 normal.  Pulmonary:     Effort: Pulmonary effort is normal.     Breath sounds: Normal breath sounds.  Skin:    General: Skin is warm and dry.  Neurological:     Mental Status: He is alert.     GCS: GCS eye subscore is 4. GCS verbal subscore is 5. GCS motor subscore is 6.  Psychiatric:        Speech: Speech normal.        Behavior: Behavior normal. Behavior is cooperative.    Assessment and Plan:   Acute Cough No red flags on exam.  Will initiate oral prednisone 20 mg BID x 5 days and doxycyline 100 mg BID x 10 days per orders. Trial tessalon perles. Discussed taking medications as prescribed. Reviewed return precautions including worsening fever, SOB, worsening cough or other concerns. Push fluids and rest. I recommend that patient follow-up if symptoms worsen or persist despite treatment x 7-10 days, sooner if needed.   I,Havlyn C Ratchford,acting as a Education administrator for [employer], PA.,have documented all relevant documentation on the behalf of Inda Coke, PA,as directed by  Inda Coke, PA while in the presence of Inda Coke, [place].  I, Inda Coke, [place], have reviewed all documentation for this visit. The documentation on 09/06/21 for the exam, diagnosis, procedures, and orders are all accurate and complete.   Inda Coke,  PA-C

## 2021-09-07 ENCOUNTER — Ambulatory Visit: Payer: Medicare HMO | Admitting: Family Medicine

## 2021-09-14 ENCOUNTER — Encounter: Payer: Medicare HMO | Admitting: Family Medicine

## 2021-09-22 ENCOUNTER — Encounter: Payer: Self-pay | Admitting: Family

## 2021-09-22 ENCOUNTER — Ambulatory Visit (INDEPENDENT_AMBULATORY_CARE_PROVIDER_SITE_OTHER): Payer: Medicare HMO | Admitting: Family

## 2021-09-22 ENCOUNTER — Other Ambulatory Visit: Payer: Self-pay

## 2021-09-22 VITALS — BP 134/78 | HR 85 | Temp 98.4°F | Ht 69.0 in | Wt 244.2 lb

## 2021-09-22 DIAGNOSIS — R6889 Other general symptoms and signs: Secondary | ICD-10-CM | POA: Insufficient documentation

## 2021-09-22 DIAGNOSIS — R059 Cough, unspecified: Secondary | ICD-10-CM | POA: Diagnosis not present

## 2021-09-22 LAB — POCT INFLUENZA A/B
Influenza A, POC: NEGATIVE
Influenza B, POC: NEGATIVE

## 2021-09-22 LAB — POC COVID19 BINAXNOW: SARS Coronavirus 2 Ag: NEGATIVE

## 2021-09-22 MED ORDER — FLUTICASONE PROPIONATE 50 MCG/ACT NA SUSP: 1.0000 | Freq: Every day | NASAL | 6 refills | Status: DC

## 2021-09-22 MED ORDER — PREDNISONE 20 MG PO TABS: 20.0000 mg | ORAL_TABLET | Freq: Every day | ORAL | 0 refills | Status: DC

## 2021-09-22 NOTE — Patient Instructions (Addendum)
It was very nice to see you today!  Your flu and covid tests are negative. As discussed, I have sent more prednisone for you to take to help with your cough. I also have sent a steroid nasal spray, generic Flonase for your sinus symptoms and postnasal drip which can be contributing to your cough. Use twice a day for a week, then reduce to daily use until all symptoms are improved. This medication is safe to use year round for people with year round allergies.  Drink at least 2 liters of water daily!     PLEASE NOTE:  If you had any lab tests please let us know if you have not heard back within a few days. You may see your results on MyChart before we have a chance to review them but we will give you a call once they are reviewed by [name]. If we ordered any referrals today, please let us know if you have not heard from their office within the next week.   Please try these tips to maintain a healthy lifestyle:  Eat most of your calories during the day when you are active. Eliminate processed foods including packaged sweets (pies, cakes, cookies), reduce intake of potatoes, white bread, white pasta, and white rice. Look for whole grain options, oat flour or almond flour.  Each meal should contain half fruits/vegetables, one quarter protein, and one quarter carbs (no bigger than a computer mouse).  Cut down on sweet beverages. This includes juice, soda, and sweet tea. Also watch fruit intake, though this is a healthier sweet option, it still contains natural sugar! Limit to 3 servings daily.  Drink at least 1 glass of water with each meal and aim for at least 8 glasses per day  Exercise at least 150 minutes every week.

## 2021-09-22 NOTE — Assessment & Plan Note (Signed)
URI sx 2 weeks ago, treated & started getting better, then last 2 days cough has worsened, exposed to family 4 days ago but no one reporting sx. covid and flu negative. Sending extra prednisone, and starting Flonase bid x 1 week, then qd. Advised to increase water intake to at least 2L/day.

## 2021-09-22 NOTE — Progress Notes (Signed)
Subjective:     Patient ID: [name] [name], male    DOB: 05-31-1951, 70 y.o.   MRN: [id number]  Chief Complaint  Patient presents with   Cough    Was treated here in the office 12/13 by Sam. Pt was given Prednisone, Tessalon, and Doxycycline. He says once he finished round of ABX, cough relapsed. He denies Chest pain, and has mild SOB. He is currently taking Delsym.    HPI: Cough: Patient complains of chills, nasal congestion, nonproductive cough, and sweats.  Symptoms began 2 weeks ago.  The cough is non-productive, without wheezing, dyspnea or hemoptysis and is aggravated by  cold air and talking.  Associated symptoms include:postnasal drip. Patient does not have new pets. Patient does not have a history of asthma. Patient does not have a history of environmental allergens. Patient has had recent travel. Patient does not have a history of smoking. Patient  has not had a previous Chest X-ray. Reports last exposure to people was on christmas day, 4 days ago, has talked to all he was with and no one reporting symptoms.   Health Maintenance Due  Topic Date Due   COVID-19 Vaccine (5 - Booster for Moderna series) 04/09/2021    Past Medical History:  Diagnosis Date   ALLERGIC RHINITIS 04/22/2007   Allergy    Cancer ([place])    HX SKIN CANCER   Complication of anesthesia    more concerned about positioning of head & neck because of TMJ   DDD (degenerative disc disease), cervical    GERD (gastroesophageal reflux disease)    infrequently   Headache(784.0)    migraines   History of kidney stones    History of skin cancer    History of TMJ disorder    HYPERCHOLESTEROLEMIA 04/22/2007   HYPERLIPIDEMIA 07/29/2007   Hyperplastic colon polyp    NEPHROLITHIASIS, HX OF 07/29/2007   Osteoarth NOS-Unspec 04/22/2007   Shortness of breath    Shoulder pain, acute    bilateral    Past Surgical History:  Procedure Laterality Date   APPENDECTOMY     BACK SURGERY  10/07/2015   COLONOSCOPY     10  yrs ago- 2 HPP    COLONOSCOPY  2019   JOINT REPLACEMENT     KNEE ARTHROSCOPY     right x2   SHOULDER SURGERY     right   skin cancer biopsy     L forehead   TOTAL KNEE ARTHROPLASTY Right 08/25/2013   Procedure: RIGHT TOTAL KNEE ARTHROPLASTY;  Surgeon: Gearlean Alf, MD;  Location: [facility];  Service: Orthopedics;  Laterality: Right;   TOTAL SHOULDER ARTHROPLASTY Left 12/25/2013   DR SUPPLE    TOTAL SHOULDER ARTHROPLASTY Left 12/25/2013   Procedure: LEFT TOTAL SHOULDER ARTHROPLASTY;  Surgeon: Marin Shutter, MD;  Location: [facility];  Service: Orthopedics;  Laterality: Left;   TOTAL SHOULDER ARTHROPLASTY Right 06/18/2014   DR SUPPLE   TOTAL SHOULDER ARTHROPLASTY Right 06/18/2014   Procedure: RIGHT TOTAL SHOULDER ARTHROPLASTY;  Surgeon: Marin Shutter, MD;  Location: [facility];  Service: Orthopedics;  Laterality: Right;   WISDOM TOOTH EXTRACTION      Outpatient Medications Prior to Visit  Medication Sig Dispense Refill   allopurinol (ZYLOPRIM) 100 MG tablet TAKE 1 TABLET EVERY DAY 90 tablet 1   amitriptyline (ELAVIL) 100 MG tablet Take 1 tablet (100 mg total) by mouth at bedtime. 90 tablet 3   aspirin 81 MG tablet Take 81 mg by mouth daily.  benzonatate (TESSALON) 200 MG capsule Take 1 capsule (200 mg total) by mouth 2 (two) times daily as needed for cough. 30 capsule 0   Cholecalciferol (VITAMIN D PO) Take 1 tablet by mouth daily.     Coenzyme Q10 (CO Q 10 PO) Take by mouth daily.     Cyanocobalamin (VITAMIN B 12 PO) Take by mouth.     doxycycline (VIBRA-TABS) 100 MG tablet Take 1 tablet (100 mg total) by mouth 2 (two) times daily. 20 tablet 0   DULoxetine (CYMBALTA) 60 MG capsule Take 1 capsule (60 mg total) by mouth daily. 90 capsule 3   EPINEPHrine 0.3 mg/0.3 mL IJ SOAJ injection INJECT 0.3 MLS INTO THE MUSCLE ONCE 2 mL 2   Fremanezumab-vfrm (AJOVY) 225 MG/1.5ML SOAJ Inject 225 mg into the skin every 30 (thirty) days. 3 pen 0   gabapentin (NEURONTIN) 300 MG capsule Take 1 capsule (300 mg  total) by mouth 2 (two) times daily. 180 capsule 3   Multiple Vitamin (MULTIVITAMIN) tablet Take 1 tablet by mouth daily.     naproxen (NAPROSYN) 500 MG tablet Take 500 mg by mouth 2 (two) times daily with a meal.     Omega-3 Fatty Acids (FISH OIL) 1000 MG CAPS Take 1 capsule by mouth daily.     ondansetron (ZOFRAN ODT) 4 MG disintegrating tablet Take 1 tablet (4 mg total) by mouth every 4 (four) hours as needed for nausea or vomiting. 20 tablet 0   oxyCODONE-acetaminophen (PERCOCET) 5-325 MG tablet Take 1-2 tablets by mouth every 6 (six) hours as needed. 20 tablet 0   pravastatin (PRAVACHOL) 40 MG tablet TAKE 1 TABLET (40 MG TOTAL) BY MOUTH DAILY. 90 tablet 3   predniSONE (DELTASONE) 20 MG tablet Take 1 tablet (20 mg total) by mouth daily with breakfast. 10 tablet 0   Psyllium (METAMUCIL PO) Take 2 tablets by mouth 2 (two) times daily.     Red Yeast Rice Extract (RED YEAST RICE PO) Take 1 tablet by mouth daily.     Semaglutide,0.25 or 0.5MG /DOS, (OZEMPIC, 0.25 OR 0.5 MG/DOSE,) 2 MG/1.5ML SOPN Inject 0.25 mg into the skin once a week. 1.5 mL 0   SUMAtriptan (IMITREX) 100 MG tablet TAKE 1 TABLET AS NEEDED FOR MIGRAINE. MAY REPEAT IN 2 HOURS IF HEADACHE PERSISTS OR RECURS. MAX 2 TABLETS IN 24 HOURS 27 tablet 3   tamsulosin (FLOMAX) 0.4 MG CAPS capsule Take 1 capsule (0.4 mg total) by mouth daily. 14 capsule 0   tamsulosin (FLOMAX) 0.4 MG CAPS capsule Take 1 capsule (0.4 mg total) by mouth daily. 30 capsule 0   tiZANidine (ZANAFLEX) 4 MG tablet TAKE 1 TABLET THREE TIMES DAILY AS NEEDED FOR MUSCLE SPASM(S) 270 tablet 3   TURMERIC CURCUMIN PO Take 1,050 mg by mouth daily.     valACYclovir (VALTREX) 500 MG tablet Take 500 mg by mouth daily.     vitamin C (ASCORBIC ACID) 500 MG tablet Take 500 mg by mouth daily.     vitamin E 1000 UNIT capsule Take 1,000 Units by mouth daily.     Facility-Administered Medications Prior to Visit  Medication Dose Route Frequency Provider Last Rate Last Admin   0.9 %   sodium chloride infusion  500 mL Intravenous Once Ladene Artist, MD        Allergies  Allergen Reactions   Shrimp [Shellfish Allergy] Anaphylaxis and Rash   Codeine Rash and Other (See Comments)    Blacked out   Contrast Media [Iodinated Contrast Media] Rash  Diazepam Rash    Rash over whole body from neck down, and he lost his voice (airway compromise?)   Amoxicillin Other (See Comments)    Unknown    Benadryl [Diphenhydramine]     With dyes    Depakote [Divalproex Sodium] Swelling    Per pts report (given by Dr. Inda Merlin).  Has throat swelling and rash.   Erythromycin Other (See Comments)    unknown   Flexeril [Cyclobenzaprine]    Allegra [Fexofenadine] Rash        Betadine [Povidone Iodine] Rash   Celebrex [Celecoxib] Rash   Meperidine And Related Rash   Methocarbamol Rash   Neosporin [Neomycin-Bacitracin Zn-Polymyx] Rash   Oxycodone Rash   Penicillins Rash   Plavix [Clopidogrel] Rash   Rivaroxaban Rash   Vioxx [Rofecoxib] Rash        Objective:    Physical Exam Vitals and nursing note reviewed.  Constitutional:      General: He is not in acute distress.    Appearance: Normal appearance.  HENT:     Head: Normocephalic.     Right Ear: Tympanic membrane and ear canal normal.     Left Ear: Tympanic membrane and ear canal normal.     Nose:     Right Sinus: No frontal sinus tenderness.     Left Sinus: No frontal sinus tenderness.     Mouth/Throat:     Mouth: Mucous membranes are moist.     Pharynx: Oropharyngeal exudate and posterior oropharyngeal erythema present. No pharyngeal swelling.  Cardiovascular:     Rate and Rhythm: Normal rate and regular rhythm.  Pulmonary:     Effort: Pulmonary effort is normal.     Breath sounds: Normal breath sounds.  Musculoskeletal:        General: Normal range of motion.     Cervical back: Normal range of motion.  Skin:    General: Skin is warm and dry.  Neurological:     Mental Status: He is alert and oriented to  person, place, and time.  Psychiatric:        Mood and Affect: Mood normal.    BP 134/78    Pulse 85    Temp 98.4 F (36.9 C) (Temporal)    Ht 5\' 9"  (1.753 m)    Wt 244 lb 3.2 oz (110.8 kg)    SpO2 95%    BMI 36.06 kg/m  Wt Readings from Last 3 Encounters:  09/22/21 244 lb 3.2 oz (110.8 kg)  09/06/21 244 lb 3.2 oz (110.8 kg)  08/29/21 237 lb (107.5 kg)       Assessment & Plan:   Problem List Items Addressed This Visit       Other   Flu-like symptoms - Primary    rapid flu & covid neg. reporting chills, checked temp and no fever, warm and sweating during visit, but pt states this is his normal presentation, and worse with exercise. advised on hydration and sending Flonase nasal spray, advised on use & SE.      Relevant Medications   predniSONE (DELTASONE) 20 MG tablet   fluticasone (FLONASE) 50 MCG/ACT nasal spray   Other Relevant Orders   POC COVID-19 (Completed)   POCT Influenza A/B (Completed)   Cough    URI sx 2 weeks ago, treated & started getting better, then last 2 days cough has worsened, exposed to family 4 days ago but no one reporting sx. covid and flu negative. Sending extra prednisone, and starting Flonase bid x 1  week, then qd. Advised to increase water intake to at least 2L/day.       Relevant Medications   predniSONE (DELTASONE) 20 MG tablet   Other Relevant Orders   POC COVID-19 (Completed)   POCT Influenza A/B (Completed)    Meds ordered this encounter  Medications   predniSONE (DELTASONE) 20 MG tablet    Sig: Take 1 tablet (20 mg total) by mouth daily with breakfast. Take 2 tabs for 2 days, then 1 tab daily    Dispense:  9 tablet    Refill:  0    Order Specific Question:   Supervising Provider    Answer:   ANDY, CAMILLE L [[id number]]   fluticasone (FLONASE) 50 MCG/ACT nasal spray    Sig: Place 1 spray into both nostrils daily. START with twice a day dosing for 1 week, then reduce to daily dosing until symptoms are resolved.    Dispense:  16 g     Refill:  6    PLEASE PULL OFF SHELF, if not covered.    Order Specific Question:   Supervising Provider    Answer:   ANDY, CAMILLE L [[id number]]

## 2021-09-22 NOTE — Assessment & Plan Note (Signed)
rapid flu & covid neg. reporting chills, checked temp and no fever, warm and sweating during visit, but pt states this is his normal presentation, and worse with exercise. advised on hydration and sending Flonase nasal spray, advised on use & SE.

## 2021-09-27 ENCOUNTER — Ambulatory Visit (INDEPENDENT_AMBULATORY_CARE_PROVIDER_SITE_OTHER): Payer: Medicare HMO | Admitting: Family Medicine

## 2021-09-27 ENCOUNTER — Ambulatory Visit (HOSPITAL_BASED_OUTPATIENT_CLINIC_OR_DEPARTMENT_OTHER)
Admission: RE | Admit: 2021-09-27 | Discharge: 2021-09-27 | Disposition: A | Discharge status: Home / Self Care | Payer: Medicare HMO | Source: Ambulatory Visit | Attending: Family Medicine | Admitting: Family Medicine

## 2021-09-27 ENCOUNTER — Encounter: Payer: Self-pay | Admitting: Family Medicine

## 2021-09-27 ENCOUNTER — Other Ambulatory Visit: Payer: Self-pay

## 2021-09-27 VITALS — BP 140/92 | HR 80 | Temp 98.2°F | Ht 69.0 in | Wt 244.0 lb

## 2021-09-27 DIAGNOSIS — G43711 Chronic migraine without aura, intractable, with status migrainosus: Secondary | ICD-10-CM

## 2021-09-27 DIAGNOSIS — J309 Allergic rhinitis, unspecified: Secondary | ICD-10-CM | POA: Diagnosis not present

## 2021-09-27 DIAGNOSIS — R059 Cough, unspecified: Secondary | ICD-10-CM | POA: Diagnosis not present

## 2021-09-27 DIAGNOSIS — R0602 Shortness of breath: Secondary | ICD-10-CM | POA: Diagnosis not present

## 2021-09-27 MED ORDER — AZITHROMYCIN 250 MG PO TABS: ORAL_TABLET | ORAL | 0 refills | Status: DC

## 2021-09-27 MED ORDER — PREDNISONE 50 MG PO TABS: ORAL_TABLET | ORAL | 0 refills | Status: DC

## 2021-09-27 MED ORDER — BENZONATATE 200 MG PO CAPS
200.0000 mg | ORAL_CAPSULE | Freq: Two times a day (BID) | ORAL | 0 refills | Status: DC | PRN
Start: 2021-09-27 — End: 2021-10-10

## 2021-09-27 MED ORDER — CEFDINIR 300 MG PO CAPS: 300.0000 mg | ORAL_CAPSULE | Freq: Two times a day (BID) | ORAL | 0 refills | Status: AC

## 2021-09-27 NOTE — Patient Instructions (Signed)
It was very nice to see you today!  I am concerned you may have pneumonia.  Please start the prednisone and 2 antibiotics.  Please go to the [facility] facility to have your x-ray done.  Let us know if your symptoms are not improving in the next couple of days.  Take care, Dr Jerline Pain  PLEASE NOTE:  If you had any lab tests please let us know if you have not heard back within a few days. You may see your results on mychart before we have a chance to review them but we will give you a call once they are reviewed by [name]. If we ordered any referrals today, please let us know if you have not heard from their office within the next week.   Please try these tips to maintain a healthy lifestyle:  Eat at least 3 REAL meals and 1-2 snacks per day.  Aim for no more than 5 hours between eating.  If you eat breakfast, please do so within one hour of getting up.   Each meal should contain half fruits/vegetables, one quarter protein, and one quarter carbs (no bigger than a computer mouse)  Cut down on sweet beverages. This includes juice, soda, and sweet tea.   Drink at least 1 glass of water with each meal and aim for at least 8 glasses per day  Exercise at least 150 minutes every week.

## 2021-09-27 NOTE — Progress Notes (Signed)
° °  [name] [name] is a 71 y.o. male who presents today for an office visit.  Assessment/Plan:  New/Acute Problems: Cough Given length of symptoms and physical exam findings concern for possible community-acquired pneumonia.  He does have some bibasilar rales however no signs of respiratory distress.  He has had recent COVID and flu testing which was negative.  We sent him course of azithromycin, Omnicef, and prednisone.  He does have extensive antibiotic allergies however he has done well with Ancef in the past-do not think he will have any issues with Omnicef.  He has also done well with azithromycin in the past.  Encourage good oral hydration.  Will check chest x-ray to confirm.  Discussed reasons to return to care or seek emergent care.    Subjective:  HPI:  He complain of lingering cough with sputum. Symptoms started about a month. He notes symptoms are getting worse recently. He saw Dr. Aldona Bar on 09/06/2021 for this issue. He was prescribed Prednisone, Tessalon, and Doxycycline at that time. He notes symptoms were improving but then got worse after completed antibiotics. He then saw different provider on 09/22/2021. Had flu and Covid test done which was negative. He was prescribed another round of prednisone 20 mg and Flonase nasal spray at that visit.   He reports symptoms started to get worse after completed prednisone. He had prednisone for 10 days. His wife notes she can hear him wheezing. Associated symptoms are shortness of breath. Symptoms are mostly worse in the morning and night. He has not had any chest x-ray in the past. He would like to get chest x-ray done. He denies swelling In legs. Had some back pain recently but no chest pain, fever, chills or nausea.   He also complain of migraine which started after cough this morning. He note he has not had any migraine for a while.          Objective:  Physical Exam: BP (!) 140/92    Pulse 80    Temp 98.2 F (36.8 C) (Temporal)     Ht 5\' 9"  (1.753 m)    Wt 244 lb (110.7 kg)    SpO2 92%    BMI 36.03 kg/m   Gen: No acute distress, resting comfortably CV: Regular rate and rhythm with no murmurs appreciated Pulm: Normal work of breathing, speaking in full sentences.  Occasional cough.  Bibasilar rales.  Occasional rhonchi.  No wheezes.  Good airflow throughout. Neuro: Grossly normal, moves all extremities Psych: Normal affect and thought content       I,Savera Zaman,acting as a scribe for Dimas Chyle, MD.,have documented all relevant documentation on the behalf of Dimas Chyle, MD,as directed by  Dimas Chyle, MD while in the presence of Dimas Chyle, MD.   I, Dimas Chyle, MD, have reviewed all documentation for this visit. The documentation on 09/27/21 for the exam, diagnosis, procedures, and orders are all accurate and complete.  Time Spent: 30 minutes of total time was spent on the date of the encounter performing the following actions: chart review prior to seeing the patient including his recent visits, obtaining history, performing a medically necessary exam, counseling on the treatment plan, placing orders, and documenting in our EHR.    Algis Greenhouse. Jerline Pain, MD 09/27/2021 10:03 AM

## 2021-09-27 NOTE — Progress Notes (Signed)
Please inform patient of the following:  His xray did not show anything acute. I would like for him to finish the medications we sent in for him and he should let us know if not improving.  [name] [name]. Jerline Pain, MD 09/27/2021 12:32 PM

## 2021-09-29 ENCOUNTER — Encounter (INDEPENDENT_AMBULATORY_CARE_PROVIDER_SITE_OTHER): Payer: Self-pay

## 2021-09-29 ENCOUNTER — Telehealth (INDEPENDENT_AMBULATORY_CARE_PROVIDER_SITE_OTHER): Payer: Self-pay

## 2021-09-29 NOTE — Telephone Encounter (Signed)
Office received package from [employer] today, obtaining 2 prefilled Ozempic pens. Container size 1.17ml,  Strength 1.34mg /ml Order # [id number] Lot # [id number] NDC# [id number]  I called and left a detailed message for [name] [name] to come and pick up Ozempic today, as we can not store it here at office.

## 2021-10-10 ENCOUNTER — Ambulatory Visit (INDEPENDENT_AMBULATORY_CARE_PROVIDER_SITE_OTHER): Payer: Medicare HMO | Admitting: Family Medicine

## 2021-10-10 ENCOUNTER — Telehealth (INDEPENDENT_AMBULATORY_CARE_PROVIDER_SITE_OTHER): Payer: Self-pay | Admitting: Family Medicine

## 2021-10-10 VITALS — BP 120/86 | HR 110 | Temp 98.1°F | Wt 249.2 lb

## 2021-10-10 DIAGNOSIS — R3915 Urgency of urination: Secondary | ICD-10-CM

## 2021-10-10 DIAGNOSIS — R052 Subacute cough: Secondary | ICD-10-CM | POA: Diagnosis not present

## 2021-10-10 DIAGNOSIS — G9339 Other post infection and related fatigue syndromes: Secondary | ICD-10-CM | POA: Diagnosis not present

## 2021-10-10 MED ORDER — BENZONATATE 200 MG PO CAPS: 200.0000 mg | ORAL_CAPSULE | Freq: Two times a day (BID) | ORAL | 0 refills | Status: DC | PRN

## 2021-10-10 NOTE — Telephone Encounter (Signed)
I spoke with patient and I informed him that paper work from 09-01-2021 will be faxed this morning to [employer].

## 2021-10-10 NOTE — Telephone Encounter (Signed)
Patient wants a call from [place] as soon as possible regarding his [employer]. Pt needs it refaxed to [employer].

## 2021-10-10 NOTE — Progress Notes (Signed)
Subjective:    Patient ID: [name] [name], male    DOB: 03/25/1951, 71 y.o.   MRN: [id number]  Chief Complaint  Patient presents with   Cough    Cough is productive, thick mucus, but not as much as when had pneumonia, fatigue from coughing. Swollen glands    HPI Patient was seen today for f/u.  Pt seen several times in Dec for productive cough.  Dx'd with PNA 09/06/21, started on prednisone 20 mg BID and doxycycline 100 BID x 10 d.  Pt seen again on 09/22/21 for worsened sx, given more prednisone, flonase, and advised to increase hydration.  Pt seen again 09/27/21, started on omnicef, azithromycin, and prednisone for concern for community acquired pna due to bibasilar rales on exam.  CXR without acue cardiopulmonary dz on 09/27/21.    Pt feels 80-85% better today.  Still having a lingering cough and fatigue.  Able to walk 1 mile, but has noticed urinary urgency since being sick.  Pt concerned about getting back to exercise routine as gained wt.  Concerned the wt gain may be contributing to the incontinence.  Followed by wt management.     Past Medical History:  Diagnosis Date   ALLERGIC RHINITIS 04/22/2007   Allergy    Cancer ([place])    HX SKIN CANCER   Complication of anesthesia    more concerned about positioning of head & neck because of TMJ   DDD (degenerative disc disease), cervical    GERD (gastroesophageal reflux disease)    infrequently   Headache(784.0)    migraines   History of kidney stones    History of skin cancer    History of TMJ disorder    HYPERCHOLESTEROLEMIA 04/22/2007   HYPERLIPIDEMIA 07/29/2007   Hyperplastic colon polyp    NEPHROLITHIASIS, HX OF 07/29/2007   Osteoarth NOS-Unspec 04/22/2007   Shortness of breath    Shoulder pain, acute    bilateral    Allergies  Allergen Reactions   Shrimp [Shellfish Allergy] Anaphylaxis and Rash   Codeine Rash and Other (See Comments)    Blacked out   Contrast Media [Iodinated Contrast Media] Rash   Diazepam Rash    Rash over  whole body from neck down, and he lost his voice (airway compromise?)   Amoxicillin Other (See Comments)    Unknown    Benadryl [Diphenhydramine]     With dyes    Depakote [Divalproex Sodium] Swelling    Per pts report (given by Dr. Inda Merlin).  Has throat swelling and rash.   Erythromycin Other (See Comments)    unknown   Flexeril [Cyclobenzaprine]    Allegra [Fexofenadine] Rash        Betadine [Povidone Iodine] Rash   Celebrex [Celecoxib] Rash   Meperidine And Related Rash   Methocarbamol Rash   Neosporin [Neomycin-Bacitracin Zn-Polymyx] Rash   Oxycodone Rash   Penicillins Rash   Plavix [Clopidogrel] Rash   Rivaroxaban Rash   Vioxx [Rofecoxib] Rash    ROS General: Denies fever, chills, night sweats, changes in weight, changes in appetite fatigue, weight gain HEENT: Denies headaches, ear pain, changes in vision, rhinorrhea, sore throat  +migraines CV: Denies CP, palpitations, SOB, orthopnea Pulm: Denies SOB, wheezing +cough GI: Denies abdominal pain, nausea, vomiting, diarrhea, constipation GU: Denies dysuria, hematuria +frequency, incontinence Msk: Denies muscle cramps, joint pains Neuro: Denies weakness, numbness, tingling Skin: Denies rashes, bruising Psych: Denies depression, anxiety, hallucinations  Objective:    Blood pressure 120/86, pulse (!) 110, temperature 98.1 F (36.7 C),  temperature source Oral, weight 249 lb 3.2 oz (113 kg), SpO2 95 %.  Gen. Pleasant, well-nourished, in no distress, normal affect   HEENT: [place]/AT, face symmetric, conjunctiva clear, no scleral icterus, PERRLA, EOMI, nares patent without drainage, pharynx without erythema or exudate.  TMs normal b/l. Lungs: intermittent cough, no accessory muscle use, CTAB, no wheezes or rales Cardiovascular: tachycardia, no m/r/g, no peripheral edema Musculoskeletal: No deformities, no cyanosis or clubbing, normal tone Neuro:  A&Ox3, CN II-XII intact, normal gait Skin:  Warm, no lesions/ rash  Wt  Readings from Last 3 Encounters:  09/27/21 244 lb (110.7 kg)  09/22/21 244 lb 3.2 oz (110.8 kg)  09/06/21 244 lb 3.2 oz (110.8 kg)    Lab Results  Component Value Date   WBC 4.6 04/18/2021   HGB 15.3 04/18/2021   HCT 44.5 04/18/2021   PLT 225.0 04/18/2021   GLUCOSE 117 (H) 04/18/2021   CHOL 250 (H) 06/09/2020   TRIG 59 06/09/2020   HDL 93 06/09/2020   LDLDIRECT 135.0 07/16/2013   LDLCALC 142 (H) 06/09/2020   ALT 34 04/18/2021   AST 25 04/18/2021   NA 137 04/18/2021   K 4.5 04/18/2021   CL 103 04/18/2021   CREATININE 1.24 04/18/2021   BUN 24 (H) 04/18/2021   CO2 27 04/18/2021   TSH 2.80 04/18/2021   PSA 1.97 06/09/2020   INR 0.98 06/10/2014   HGBA1C 5.3 03/15/2020    Assessment/Plan:  Subacute cough  -post infectious etiology bacterial vs viral -supportive care including OTC cough/cold medication -given strict precautions - Plan: benzonatate (TESSALON) 200 MG capsule  Other post infection and related fatigue syndromes -advised likely duration of symptoms -pt advised to ease back into regular routine and to have realistic expectations of ability while recovering due to deconditioning.  Urinary urgency -possibly worsened by increased fluid intake and weight gain. -limit caffeine intake -continue to monitor  F/u prn  Grier Mitts, MD

## 2021-10-11 ENCOUNTER — Ambulatory Visit (INDEPENDENT_AMBULATORY_CARE_PROVIDER_SITE_OTHER): Payer: Medicare HMO | Admitting: Family Medicine

## 2021-10-13 ENCOUNTER — Telehealth: Payer: Self-pay | Admitting: Adult Health

## 2021-10-13 NOTE — Telephone Encounter (Signed)
I tried to do a PA on cover my meds but received this message: Authorization already on file for this request. Authorization starting on 09/25/2020 and ending on 09/24/2022. I called [employer] and asked for the approval letter to be faxed to [name].    Approval letter received, then faxed to [employer]. Received a receipt of confirmation.

## 2021-10-13 NOTE — Telephone Encounter (Signed)
Pt has called to report that he has been informed by [employer] that re: pt's Ajovy  they need the [facility] prior approval form for year 2023.  Please call

## 2021-10-18 ENCOUNTER — Encounter: Payer: Self-pay | Admitting: Family Medicine

## 2021-10-18 ENCOUNTER — Encounter (INDEPENDENT_AMBULATORY_CARE_PROVIDER_SITE_OTHER): Payer: Self-pay | Admitting: Family Medicine

## 2021-10-18 DIAGNOSIS — R7303 Prediabetes: Secondary | ICD-10-CM

## 2021-10-18 NOTE — Telephone Encounter (Signed)
Please see message and advise.  Thank you. ° °

## 2021-10-19 NOTE — Telephone Encounter (Signed)
Refill ozempic and let him know to restart his ozempic

## 2021-10-20 MED ORDER — OZEMPIC (0.25 OR 0.5 MG/DOSE) 2 MG/1.5ML ~~LOC~~ SOPN: 0.2500 mg | PEN_INJECTOR | SUBCUTANEOUS | 0 refills | Status: DC

## 2021-10-25 ENCOUNTER — Other Ambulatory Visit: Payer: Self-pay | Admitting: *Deleted

## 2021-10-25 MED ORDER — AJOVY 225 MG/1.5ML ~~LOC~~ SOAJ: 225.0000 mg | SUBCUTANEOUS | 3 refills | Status: DC

## 2021-10-28 ENCOUNTER — Other Ambulatory Visit: Payer: Self-pay | Admitting: Family Medicine

## 2021-11-01 ENCOUNTER — Encounter (INDEPENDENT_AMBULATORY_CARE_PROVIDER_SITE_OTHER): Payer: Self-pay | Admitting: Family Medicine

## 2021-11-01 NOTE — Telephone Encounter (Signed)
Note taken.

## 2021-11-01 NOTE — Telephone Encounter (Signed)
Dr.Beasley 

## 2021-11-07 ENCOUNTER — Other Ambulatory Visit: Payer: Self-pay

## 2021-11-07 ENCOUNTER — Ambulatory Visit (INDEPENDENT_AMBULATORY_CARE_PROVIDER_SITE_OTHER): Payer: Medicare HMO | Admitting: Family Medicine

## 2021-11-07 ENCOUNTER — Encounter (INDEPENDENT_AMBULATORY_CARE_PROVIDER_SITE_OTHER): Payer: Self-pay | Admitting: Family Medicine

## 2021-11-07 VITALS — BP 126/74 | HR 68 | Temp 98.0°F | Ht 69.0 in | Wt 235.0 lb

## 2021-11-07 DIAGNOSIS — E669 Obesity, unspecified: Secondary | ICD-10-CM

## 2021-11-07 DIAGNOSIS — Z6834 Body mass index (BMI) 34.0-34.9, adult: Secondary | ICD-10-CM | POA: Diagnosis not present

## 2021-11-07 DIAGNOSIS — Z6833 Body mass index (BMI) 33.0-33.9, adult: Secondary | ICD-10-CM

## 2021-11-07 DIAGNOSIS — R7303 Prediabetes: Secondary | ICD-10-CM | POA: Diagnosis not present

## 2021-11-07 MED ORDER — OZEMPIC (0.25 OR 0.5 MG/DOSE) 2 MG/1.5ML ~~LOC~~ SOPN: 0.2500 mg | PEN_INJECTOR | SUBCUTANEOUS | 0 refills | Status: DC

## 2021-11-07 NOTE — Progress Notes (Signed)
Chief Complaint:   OBESITY [name] [name] is here to discuss his progress with his obesity treatment plan along with follow-up of his obesity related diagnoses. [name] [name] is on the Category 3 Plan and states he is following his eating plan approximately 40% of the time. [name] [name] states he is doing 0 minutes 0 times per week.  Today's visit was #: 66 Starting weight: 230 lbs Starting date: 06/18/2017 Today's weight: 235 lbs Today's date: 11/07/2021 Total lbs lost to date: 0 Total lbs lost since last in-office visit: 2  Interim History: [name] [name] has done well with his 2 lb weight loss since his last visit. Since his last visit, he was diagnosed with pneumonia. He took several rounds of antibiotics and steroids. He wasn't able to follow his plan or exercise. He has restarted exercising as tolerated and is following his Category 3 plan over the past 3 weeks. He notes his weight increased to 250 lbs since his last visit, and today it is 235 lbs. He is sleeping 5-9 hour per night.   Subjective:   1. Pre-diabetes [name] [name] is taking Ozempic 0.25 mg 3 times per week, with some nausea noted. He feels that he has been able to start getting back on track over the past 3 weeks.  Assessment/Plan:   1. Pre-diabetes [name] [name] will continue to work on weight loss, exercise, and decreasing simple carbohydrates to help decrease the risk of diabetes. We will refill Ozempic at 0.25 mg for 1 month. We will obtain labs at his next visit.    - Semaglutide,0.25 or 0.5MG /DOS, (OZEMPIC, 0.25 OR 0.5 MG/DOSE,) 2 MG/1.5ML SOPN; Inject 0.25 mg into the skin once a week.  Dispense: 1.5 mL; Refill: 0  2. Obesity with current BMI 34.8 [name] [name] is currently in the action stage of change. As such, his goal is to continue with weight loss efforts. He has agreed to the Category 3 Plan.   Exercise goals: As is and as tolerated.  Behavioral modification strategies: increasing lean protein intake, increasing vegetables, increasing water intake,  and keeping healthy foods in the home.  [name] [name] has agreed to follow-up with our clinic in 4 weeks. He was informed of the importance of frequent follow-up visits to maximize his success with intensive lifestyle modifications for his multiple health conditions.   Objective:   Blood pressure 126/74, pulse 68, temperature 98 F (36.7 C), height 5\' 9"  (1.753 m), weight 235 lb (106.6 kg), SpO2 98 %. Body mass index is 34.7 kg/m.  General: Cooperative, alert, well developed, in no acute distress. HEENT: Conjunctivae and lids unremarkable. Cardiovascular: Regular rhythm.  Lungs: Normal work of breathing. Neurologic: No focal deficits.   Lab Results  Component Value Date   CREATININE 1.24 04/18/2021   BUN 24 (H) 04/18/2021   NA 137 04/18/2021   K 4.5 04/18/2021   CL 103 04/18/2021   CO2 27 04/18/2021   Lab Results  Component Value Date   ALT 34 04/18/2021   AST 25 04/18/2021   ALKPHOS 43 04/18/2021   BILITOT 0.4 04/18/2021   Lab Results  Component Value Date   HGBA1C 5.3 03/15/2020   HGBA1C 5.7 (H) 06/19/2019   HGBA1C 5.5 11/21/2017   HGBA1C 5.8 (H) 06/18/2017   Lab Results  Component Value Date   INSULIN 5.0 03/15/2020   INSULIN 8.5 06/19/2019   INSULIN 8.6 11/21/2017   INSULIN 16.5 06/18/2017   Lab Results  Component Value Date   TSH 2.80 04/18/2021   Lab Results  Component Value Date  CHOL 250 (H) 06/09/2020   HDL 93 06/09/2020   LDLCALC 142 (H) 06/09/2020   LDLDIRECT 135.0 07/16/2013   TRIG 59 06/09/2020   CHOLHDL 2.7 06/09/2020   Lab Results  Component Value Date   VD25OH 89.1 03/15/2020   VD25OH 78.1 06/19/2019   VD25OH 59.6 11/21/2017   Lab Results  Component Value Date   WBC 4.6 04/18/2021   HGB 15.3 04/18/2021   HCT 44.5 04/18/2021   MCV 96.2 04/18/2021   PLT 225.0 04/18/2021   No results found for: IRON, TIBC, FERRITIN  Obesity Behavioral Intervention:   Approximately 15 minutes were spent on the discussion below.  ASK: We discussed  the diagnosis of obesity with [name] [name] today and [name] [name] agreed to give [name] permission to discuss obesity behavioral modification therapy today.  ASSESS: [name] [name] has the diagnosis of obesity and his BMI today is 34.8. [name] [name] is in the action stage of change.   ADVISE: [name] [name] was educated on the multiple health risks of obesity as well as the benefit of weight loss to improve his health. He was advised of the need for long term treatment and the importance of lifestyle modifications to improve his current health and to decrease his risk of future health problems.  AGREE: Multiple dietary modification options and treatment options were discussed and [name] [name] agreed to follow the recommendations documented in the above note.  ARRANGE: [name] [name] was educated on the importance of frequent visits to treat obesity as outlined per CMS and USPSTF guidelines and agreed to schedule his next follow up appointment today.  Attestation Statements:   Reviewed by clinician on day of visit: allergies, medications, problem list, medical history, surgical history, family history, social history, and previous encounter notes.   I, Trixie Dredge, am acting as transcriptionist for Dennard Nip, MD.  I have reviewed the above documentation for accuracy and completeness, and I agree with the above. -  Dennard Nip, MD

## 2021-12-06 ENCOUNTER — Encounter: Payer: Self-pay | Admitting: Adult Health

## 2021-12-08 ENCOUNTER — Ambulatory Visit (INDEPENDENT_AMBULATORY_CARE_PROVIDER_SITE_OTHER): Payer: Medicare HMO | Admitting: Family Medicine

## 2021-12-08 ENCOUNTER — Other Ambulatory Visit: Payer: Self-pay

## 2021-12-08 ENCOUNTER — Encounter (INDEPENDENT_AMBULATORY_CARE_PROVIDER_SITE_OTHER): Payer: Self-pay | Admitting: Family Medicine

## 2021-12-08 VITALS — BP 125/74 | HR 68 | Temp 97.7°F | Ht 69.0 in | Wt 237.0 lb

## 2021-12-08 DIAGNOSIS — E785 Hyperlipidemia, unspecified: Secondary | ICD-10-CM | POA: Diagnosis not present

## 2021-12-08 DIAGNOSIS — Z6835 Body mass index (BMI) 35.0-35.9, adult: Secondary | ICD-10-CM

## 2021-12-08 DIAGNOSIS — R7303 Prediabetes: Secondary | ICD-10-CM | POA: Diagnosis not present

## 2021-12-08 DIAGNOSIS — E559 Vitamin D deficiency, unspecified: Secondary | ICD-10-CM

## 2021-12-08 DIAGNOSIS — E669 Obesity, unspecified: Secondary | ICD-10-CM

## 2021-12-09 LAB — VITAMIN D 25 HYDROXY (VIT D DEFICIENCY, FRACTURES): Vit D, 25-Hydroxy: 58.7 ng/mL (ref 30.0–100.0)

## 2021-12-09 LAB — CMP14+EGFR
ALT: 30 IU/L (ref 0–44)
AST: 32 IU/L (ref 0–40)
Albumin/Globulin Ratio: 2 (ref 1.2–2.2)
Albumin: 4.4 g/dL (ref 3.8–4.8)
Alkaline Phosphatase: 47 IU/L (ref 44–121)
BUN/Creatinine Ratio: 20 (ref 10–24)
BUN: 28 mg/dL — ABNORMAL HIGH (ref 8–27)
Bilirubin Total: 0.4 mg/dL (ref 0.0–1.2)
CO2: 21 mmol/L (ref 20–29)
Calcium: 9 mg/dL (ref 8.6–10.2)
Chloride: 103 mmol/L (ref 96–106)
Creatinine, Ser: 1.37 mg/dL — ABNORMAL HIGH (ref 0.76–1.27)
Globulin, Total: 2.2 g/dL (ref 1.5–4.5)
Glucose: 99 mg/dL (ref 70–99)
Potassium: 4.8 mmol/L (ref 3.5–5.2)
Sodium: 146 mmol/L — ABNORMAL HIGH (ref 134–144)
Total Protein: 6.6 g/dL (ref 6.0–8.5)
eGFR: 55 mL/min/{1.73_m2} — ABNORMAL LOW (ref >59)

## 2021-12-09 LAB — LIPID PANEL WITH LDL/HDL RATIO
Cholesterol, Total: 210 mg/dL — ABNORMAL HIGH (ref 100–199)
HDL: 61 mg/dL (ref >39)
LDL Chol Calc (NIH): 126 mg/dL — ABNORMAL HIGH (ref 0–99)
LDL/HDL Ratio: 2.1 ratio (ref 0.0–3.6)
Triglycerides: 133 mg/dL (ref 0–149)
VLDL Cholesterol Cal: 23 mg/dL (ref 5–40)

## 2021-12-09 LAB — HEMOGLOBIN A1C
Est. average glucose Bld gHb Est-mCnc: 123 mg/dL
Hgb A1c MFr Bld: 5.9 % — ABNORMAL HIGH (ref 4.8–5.6)

## 2021-12-09 LAB — INSULIN, RANDOM: INSULIN: 20.2 u[IU]/mL (ref 2.6–24.9)

## 2021-12-13 NOTE — Progress Notes (Signed)
? ? ? ?Chief Complaint:  ? ?OBESITY ?[name] [name] is here to discuss his progress with his obesity treatment plan along with follow-up of his obesity related diagnoses. [name] [name] is on the Category 3 Plan and states he is following his eating plan approximately 50% of the time. [name] [name] states he is walking 10,000 steps 7 times per week.   ? ?Today's visit was #: 13 ?Starting weight: 230 lbs ?Starting date: 06/18/2017 ?Today's weight: 237 lbs ?Today's date: 12/08/2021 ?Total lbs lost to date: 0 ?Total lbs lost since last in-office visit: 0 ? ?Interim History: [name] [name] continues to monitor his nutrition and he is exercising regularly. He is frustrated with his lack of weight loss. He is hesitant to change to Category 3 strictly, but he is willing to give it a try. ? ?Subjective:  ? ?1. Pre-diabetes ?[name] [name] is on Ozempic and he had some nausea and GI upset originally, but it is resolved. He is due for labs. He is getting his Ozempic from the company and having it shipped to the office. ? ?2. Vitamin D deficiency ?[name] [name] is on Vitamin D, and he is due for labs. ? ?3. Other hyperlipidemia ?[name] [name] is due for labs. He is working on his diet and exercise, and he denies chest pain. ? ?Assessment/Plan:  ? ?1. Pre-diabetes ?We will check labs today. [name] [name] will continue Ozempic, and will continue with diet and exercise to help decrease the risk of diabetes.  ? ?- Insulin, random ?- Hemoglobin A1c ?- CMP14+EGFR ? ?2. Vitamin D deficiency ?We will check labs today. [name] [name] will follow-up for routine testing of Vitamin D, at least 2-3 times per year to avoid over-replacement. ? ?- VITAMIN D 25 Hydroxy (Vit-D Deficiency, Fractures) ? ?3. Other hyperlipidemia ?Cardiovascular risk and specific lipid/LDL goals reviewed. We will check labs today. [name] [name] will continue to work on diet, exercise and weight loss efforts. Orders and follow up as documented in patient record.  ? ?- Lipid Panel With LDL/HDL Ratio ? ?4. Obesity with current BMI 35.0 ?[name] [name] is  currently in the action stage of change. As such, his goal is to continue with weight loss efforts. He has agreed to strictly follow the Category 3 Plan.  ? ?Exercise goals: High intensity cardio for 30-45 minutes per day. ? ?Behavioral modification strategies: [name] [name] is to follow the Category 3 plan strictly with no deviation. ? ?[name] [name] has agreed to follow-up with our clinic in 3 weeks. He was informed of the importance of frequent follow-up visits to maximize his success with intensive lifestyle modifications for his multiple health conditions.  ? ?[name] [name] was informed we would discuss his lab results at his next visit unless there is a critical issue that needs to be addressed sooner. [name] [name] agreed to keep his next visit at the agreed upon time to discuss these results. ? ?Objective:  ? ?Blood pressure 125/74, pulse 68, temperature 97.7 ?F (36.5 ?C), height _0  (1.753 m), weight 237 lb (107.5 kg), SpO2 96 %. ?Body mass index is 35 kg/m?. ? ?General: Cooperative, alert, well developed, in no acute distress. ?HEENT: Conjunctivae and lids unremarkable. ?Cardiovascular: Regular rhythm.  ?Lungs: Normal work of breathing. ?Neurologic: No focal deficits.  ? ?Lab Results  ?Component Value Date  ? CREATININE 1.37 (H) 12/08/2021  ? BUN 28 (H) 12/08/2021  ? NA 146 (H) 12/08/2021  ? K 4.8 12/08/2021  ? CL 103 12/08/2021  ? CO2 21 12/08/2021  ? ?Lab Results  ?Component Value Date  ? ALT 30 12/08/2021  ? AST  32 12/08/2021  ? ALKPHOS 47 12/08/2021  ? BILITOT 0.4 12/08/2021  ? ?Lab Results  ?Component Value Date  ? HGBA1C 5.9 (H) 12/08/2021  ? HGBA1C 5.3 03/15/2020  ? HGBA1C 5.7 (H) 06/19/2019  ? HGBA1C 5.5 11/21/2017  ? HGBA1C 5.8 (H) 06/18/2017  ? ?Lab Results  ?Component Value Date  ? INSULIN 20.2 12/08/2021  ? INSULIN 5.0 03/15/2020  ? INSULIN 8.5 06/19/2019  ? INSULIN 8.6 11/21/2017  ? INSULIN 16.5 06/18/2017  ? ?Lab Results  ?Component Value Date  ? TSH 2.80 04/18/2021  ? ?Lab Results  ?Component Value Date  ? CHOL 210 (H)  12/08/2021  ? HDL 61 12/08/2021  ? LDLCALC 126 (H) 12/08/2021  ? LDLDIRECT 135.0 07/16/2013  ? TRIG 133 12/08/2021  ? CHOLHDL 2.7 06/09/2020  ? ?Lab Results  ?Component Value Date  ? VD25OH 58.7 12/08/2021  ? VD25OH 89.1 03/15/2020  ? VD25OH 78.1 06/19/2019  ? ?Lab Results  ?Component Value Date  ? WBC 4.6 04/18/2021  ? HGB 15.3 04/18/2021  ? HCT 44.5 04/18/2021  ? MCV 96.2 04/18/2021  ? PLT 225.0 04/18/2021  ? ?No results found for: IRON, TIBC, FERRITIN ? ?Obesity Behavioral Intervention:  ? ?Approximately 15 minutes were spent on the discussion below. ? ?ASK: ?We discussed the diagnosis of obesity with [name] [name] today and [name] [name] agreed to give [name] permission to discuss obesity behavioral modification therapy today. ? ?ASSESS: ?[name] [name] has the diagnosis of obesity and his BMI today is 35.0. [name] [name] is in the action stage of change.  ? ?ADVISE: ?[name] [name] was educated on the multiple health risks of obesity as well as the benefit of weight loss to improve his health. He was advised of the need for long term treatment and the importance of lifestyle modifications to improve his current health and to decrease his risk of future health problems. ? ?AGREE: ?Multiple dietary modification options and treatment options were discussed and [name] [name] agreed to follow the recommendations documented in the above note. ? ?ARRANGE: ?[name] [name] was educated on the importance of frequent visits to treat obesity as outlined per CMS and USPSTF guidelines and agreed to schedule his next follow up appointment today. ? ?Attestation Statements:  ? ?Reviewed by clinician on day of visit: allergies, medications, problem list, medical history, surgical history, family history, social history, and previous encounter notes. ? ? ?I, Trixie Dredge, am acting as transcriptionist for Dennard Nip, MD. ? ?I have reviewed the above documentation for accuracy and completeness, and I agree with the above. -  Dennard Nip, MD ? ? ?

## 2021-12-29 ENCOUNTER — Ambulatory Visit (INDEPENDENT_AMBULATORY_CARE_PROVIDER_SITE_OTHER): Payer: Medicare HMO | Admitting: Family Medicine

## 2022-01-02 ENCOUNTER — Telehealth (INDEPENDENT_AMBULATORY_CARE_PROVIDER_SITE_OTHER): Payer: Self-pay

## 2022-01-02 NOTE — Telephone Encounter (Signed)
I called patient to inform him that the Ozempic order has been delivered.  From [employer].  Product # [id number]- Ozempic 1x3ML. ?NCD # [id number].  ?Patient is aware to pick this up before the end of the day. ? ?Elnora Morrison, RM ?

## 2022-01-16 ENCOUNTER — Encounter: Payer: Self-pay | Admitting: Adult Health

## 2022-01-16 DIAGNOSIS — G4733 Obstructive sleep apnea (adult) (pediatric): Secondary | ICD-10-CM

## 2022-01-16 NOTE — Telephone Encounter (Signed)
Yes ok for set pressure of 11 ?

## 2022-01-17 NOTE — Telephone Encounter (Addendum)
Travel CPAP order faxed to [employer]. Also emailed securely to patient at his request. ? ?Update: fax wouldn't go through even with multiple attempts. Spoke to [employer]. They do not have an alternate fax number so I emailed to [email or web address] ? ?  ?

## 2022-01-19 ENCOUNTER — Ambulatory Visit (INDEPENDENT_AMBULATORY_CARE_PROVIDER_SITE_OTHER): Payer: Medicare HMO | Admitting: Family Medicine

## 2022-01-19 ENCOUNTER — Encounter (INDEPENDENT_AMBULATORY_CARE_PROVIDER_SITE_OTHER): Payer: Self-pay | Admitting: Family Medicine

## 2022-01-19 VITALS — BP 117/72 | HR 66 | Temp 97.3°F | Ht 69.0 in | Wt 225.0 lb

## 2022-01-19 DIAGNOSIS — E669 Obesity, unspecified: Secondary | ICD-10-CM

## 2022-01-19 DIAGNOSIS — Z7985 Long-term (current) use of injectable non-insulin antidiabetic drugs: Secondary | ICD-10-CM | POA: Diagnosis not present

## 2022-01-19 DIAGNOSIS — R748 Abnormal levels of other serum enzymes: Secondary | ICD-10-CM

## 2022-01-19 DIAGNOSIS — E7849 Other hyperlipidemia: Secondary | ICD-10-CM

## 2022-01-19 DIAGNOSIS — E119 Type 2 diabetes mellitus without complications: Secondary | ICD-10-CM | POA: Diagnosis not present

## 2022-01-19 DIAGNOSIS — Z6833 Body mass index (BMI) 33.0-33.9, adult: Secondary | ICD-10-CM

## 2022-01-23 MED ORDER — OZEMPIC (0.25 OR 0.5 MG/DOSE) 2 MG/1.5ML ~~LOC~~ SOPN: 0.5000 mg | PEN_INJECTOR | SUBCUTANEOUS | 0 refills | Status: DC

## 2022-02-02 DIAGNOSIS — Z0100 Encounter for examination of eyes and vision without abnormal findings: Secondary | ICD-10-CM | POA: Diagnosis not present

## 2022-02-02 NOTE — Progress Notes (Signed)
Chief Complaint:   OBESITY [name] [name] is here to discuss his progress with his obesity treatment plan along with follow-up of his obesity related diagnoses. [name] [name] is on the Category 3 Plan and states he is following his eating plan approximately 80% of the time. Anita states he is walking and doing cardio for 90 minutes 7 times per week.  Today's visit was #: 31 Starting weight: 230 lbs Starting date: 06/18/2017 Today's weight: 225 lbs Today's date: 01/19/2022 Total lbs lost to date: 5 Total lbs lost since last in-office visit: 12  Interim History: [name] [name] continues to do well with weight loss. He is doing better  on his Category 3 plan, and he is working on decreasing simple carbohydrates. His hunger has been a bit of a problem recently.  Subjective:   1. Controlled type 2 diabetes mellitus without complication, without long-term current use of insulin ([name] [name]) [name] [name] continue to do well on Ozempic. He notes getting full faster but has still some hunger. I discussed labs with the patient today.   2. Elevated creatine kinase [name] [name]'s BUN and creatinine are elevated (worsening). He notes he hasn't been doing as much water. I discussed labs with the patient today.   3. Other hyperlipidemia [name] [name]'s LDL is not yet at goal (worsening). He had been missing his pravastatin dose lately. I discussed labs with the patient today.  Assessment/Plan:   1. Controlled type 2 diabetes mellitus without complication, without long-term current use of insulin ([name] [name]) [name] [name] agreed to increase Ozempic to 0.5 mg q week, with no refills. We will follow up at his next visit.  - Semaglutide,0.25 or 0.'5MG'$ /DOS, (OZEMPIC, 0.25 OR 0.5 MG/DOSE,) 2 MG/1.5ML SOPN; Inject 0.5 mg into the skin once a week.  Dispense: 3 mL; Refill: 0  2. Elevated creatine kinase [name] [name] will continue to increase his water intake, and we will recheck labs in 3 months.   3. Other hyperlipidemia [name] [name] will continue pravastatin, and his diet, and  we will recheck labs in 3 months. Orders and follow up as documented in patient record.   4. Obesity with current BMI 33.2 [name] [name] is currently in the action stage of change. As such, his goal is to continue with weight loss efforts. He has agreed to the Category 3 Plan.   Exercise goals: As is.  Behavioral modification strategies: increasing lean protein intake and meal planning and cooking strategies.  [name] [name] has agreed to follow-up with our clinic in 4 weeks. He was informed of the importance of frequent follow-up visits to maximize his success with intensive lifestyle modifications for his multiple health conditions.   Objective:   Blood pressure 117/72, pulse 66, temperature (!) 97.3 F (36.3 C), height '5\' 9"'$  (1.753 m), weight 225 lb (102.1 kg), SpO2 98 %. Body mass index is 33.23 kg/m.  General: Cooperative, alert, well developed, in no acute distress. HEENT: Conjunctivae and lids unremarkable. Cardiovascular: Regular rhythm.  Lungs: Normal work of breathing. Neurologic: No focal deficits.   Lab Results  Component Value Date   CREATININE 1.37 (H) 12/08/2021   BUN 28 (H) 12/08/2021   NA 146 (H) 12/08/2021   K 4.8 12/08/2021   CL 103 12/08/2021   CO2 21 12/08/2021   Lab Results  Component Value Date   ALT 30 12/08/2021   AST 32 12/08/2021   ALKPHOS 47 12/08/2021   BILITOT 0.4 12/08/2021   Lab Results  Component Value Date   HGBA1C 5.9 (H) 12/08/2021   HGBA1C 5.3 03/15/2020   HGBA1C  5.7 (H) 06/19/2019   HGBA1C 5.5 11/21/2017   HGBA1C 5.8 (H) 06/18/2017   Lab Results  Component Value Date   INSULIN 20.2 12/08/2021   INSULIN 5.0 03/15/2020   INSULIN 8.5 06/19/2019   INSULIN 8.6 11/21/2017   INSULIN 16.5 06/18/2017   Lab Results  Component Value Date   TSH 2.80 04/18/2021   Lab Results  Component Value Date   CHOL 210 (H) 12/08/2021   HDL 61 12/08/2021   LDLCALC 126 (H) 12/08/2021   LDLDIRECT 135.0 07/16/2013   TRIG 133 12/08/2021   CHOLHDL 2.7  06/09/2020   Lab Results  Component Value Date   VD25OH 58.7 12/08/2021   VD25OH 89.1 03/15/2020   VD25OH 78.1 06/19/2019   Lab Results  Component Value Date   WBC 4.6 04/18/2021   HGB 15.3 04/18/2021   HCT 44.5 04/18/2021   MCV 96.2 04/18/2021   PLT 225.0 04/18/2021   No results found for: IRON, TIBC, FERRITIN  Obesity Behavioral Intervention:   Approximately 15 minutes were spent on the discussion below.  ASK: We discussed the diagnosis of obesity with [name] [name] today and [name] [name] agreed to give [name] permission to discuss obesity behavioral modification therapy today.  ASSESS: [name] [name] has the diagnosis of obesity and his BMI today is 33.2. [name] [name] is in the action stage of change.   ADVISE: [name] [name] was educated on the multiple health risks of obesity as well as the benefit of weight loss to improve his health. He was advised of the need for long term treatment and the importance of lifestyle modifications to improve his current health and to decrease his risk of future health problems.  AGREE: Multiple dietary modification options and treatment options were discussed and [name] [name] agreed to follow the recommendations documented in the above note.  ARRANGE: [name] [name] was educated on the importance of frequent visits to treat obesity as outlined per CMS and USPSTF guidelines and agreed to schedule his next follow up appointment today.  Attestation Statements:   Reviewed by clinician on day of visit: allergies, medications, problem list, medical history, surgical history, family history, social history, and previous encounter notes.   I, Trixie Dredge, am acting as transcriptionist for Dennard Nip, MD.  I have reviewed the above documentation for accuracy and completeness, and I agree with the above. -  Dennard Nip, MD

## 2022-02-05 ENCOUNTER — Other Ambulatory Visit: Payer: Self-pay | Admitting: Adult Health

## 2022-02-21 ENCOUNTER — Ambulatory Visit (INDEPENDENT_AMBULATORY_CARE_PROVIDER_SITE_OTHER): Payer: Medicare HMO | Admitting: Family Medicine

## 2022-02-21 DIAGNOSIS — Z961 Presence of intraocular lens: Secondary | ICD-10-CM | POA: Diagnosis not present

## 2022-03-13 ENCOUNTER — Other Ambulatory Visit: Payer: Self-pay | Admitting: Adult Health

## 2022-03-13 DIAGNOSIS — G43711 Chronic migraine without aura, intractable, with status migrainosus: Secondary | ICD-10-CM

## 2022-03-20 ENCOUNTER — Encounter (INDEPENDENT_AMBULATORY_CARE_PROVIDER_SITE_OTHER): Payer: Self-pay

## 2022-03-20 ENCOUNTER — Ambulatory Visit (INDEPENDENT_AMBULATORY_CARE_PROVIDER_SITE_OTHER): Payer: Medicare HMO | Admitting: Family Medicine

## 2022-03-23 ENCOUNTER — Other Ambulatory Visit: Payer: Self-pay | Admitting: Adult Health

## 2022-03-23 ENCOUNTER — Other Ambulatory Visit: Payer: Self-pay | Admitting: Family Medicine

## 2022-03-23 DIAGNOSIS — Z889 Allergy status to unspecified drugs, medicaments and biological substances status: Secondary | ICD-10-CM

## 2022-03-29 ENCOUNTER — Encounter (INDEPENDENT_AMBULATORY_CARE_PROVIDER_SITE_OTHER): Payer: Self-pay | Admitting: Family Medicine

## 2022-03-29 NOTE — Telephone Encounter (Signed)
Please review

## 2022-04-09 ENCOUNTER — Other Ambulatory Visit: Payer: Self-pay | Admitting: Adult Health

## 2022-04-13 ENCOUNTER — Telehealth: Payer: Self-pay | Admitting: Adult Health

## 2022-04-13 NOTE — Telephone Encounter (Signed)
..   Pt understands that although there may be some limitations with this type of visit, we will take all precautions to reduce any security or privacy concerns.  Pt understands that this will be treated like an in office visit and we will file with pt's insurance, and there may be a patient responsible charge related to this service. ? ?

## 2022-04-18 ENCOUNTER — Ambulatory Visit: Payer: Medicare HMO | Admitting: Adult Health

## 2022-04-24 ENCOUNTER — Ambulatory Visit: Payer: Medicare HMO | Admitting: Adult Health

## 2022-04-25 ENCOUNTER — Ambulatory Visit (INDEPENDENT_AMBULATORY_CARE_PROVIDER_SITE_OTHER): Payer: Medicare HMO | Admitting: Family Medicine

## 2022-04-28 ENCOUNTER — Other Ambulatory Visit (INDEPENDENT_AMBULATORY_CARE_PROVIDER_SITE_OTHER): Payer: Self-pay | Admitting: Family Medicine

## 2022-04-28 ENCOUNTER — Other Ambulatory Visit: Payer: Self-pay | Admitting: Adult Health

## 2022-04-28 DIAGNOSIS — E119 Type 2 diabetes mellitus without complications: Secondary | ICD-10-CM

## 2022-05-01 ENCOUNTER — Other Ambulatory Visit (INDEPENDENT_AMBULATORY_CARE_PROVIDER_SITE_OTHER): Payer: Self-pay | Admitting: Family Medicine

## 2022-05-01 DIAGNOSIS — E119 Type 2 diabetes mellitus without complications: Secondary | ICD-10-CM

## 2022-05-03 ENCOUNTER — Encounter (INDEPENDENT_AMBULATORY_CARE_PROVIDER_SITE_OTHER): Payer: Self-pay

## 2022-05-11 ENCOUNTER — Ambulatory Visit (INDEPENDENT_AMBULATORY_CARE_PROVIDER_SITE_OTHER): Payer: Medicare HMO | Admitting: Family Medicine

## 2022-05-11 ENCOUNTER — Telehealth (INDEPENDENT_AMBULATORY_CARE_PROVIDER_SITE_OTHER): Payer: Self-pay | Admitting: Family Medicine

## 2022-05-11 ENCOUNTER — Encounter (INDEPENDENT_AMBULATORY_CARE_PROVIDER_SITE_OTHER): Payer: Self-pay | Admitting: Family Medicine

## 2022-05-11 VITALS — BP 107/66 | HR 78 | Temp 97.4°F | Ht 69.0 in | Wt 225.0 lb

## 2022-05-11 DIAGNOSIS — Z6833 Body mass index (BMI) 33.0-33.9, adult: Secondary | ICD-10-CM

## 2022-05-11 DIAGNOSIS — E669 Obesity, unspecified: Secondary | ICD-10-CM

## 2022-05-11 DIAGNOSIS — I10 Essential (primary) hypertension: Secondary | ICD-10-CM | POA: Insufficient documentation

## 2022-05-11 DIAGNOSIS — E1169 Type 2 diabetes mellitus with other specified complication: Secondary | ICD-10-CM | POA: Diagnosis not present

## 2022-05-11 DIAGNOSIS — Z7985 Long-term (current) use of injectable non-insulin antidiabetic drugs: Secondary | ICD-10-CM

## 2022-05-11 DIAGNOSIS — E119 Type 2 diabetes mellitus without complications: Secondary | ICD-10-CM | POA: Insufficient documentation

## 2022-05-11 MED ORDER — OZEMPIC (0.25 OR 0.5 MG/DOSE) 2 MG/1.5ML ~~LOC~~ SOPN: 0.5000 mg | PEN_INJECTOR | SUBCUTANEOUS | 0 refills | Status: DC

## 2022-05-11 NOTE — Telephone Encounter (Signed)
PROBLEM:  REQUEST CORRECTION ON NOVO [employer] FORM:  there is one line on the form that needs corrected. Per patient.   AUTO REFILL:  YES Please write yes and have the doctor sign and date form and fax it to [phone].

## 2022-05-15 ENCOUNTER — Encounter (INDEPENDENT_AMBULATORY_CARE_PROVIDER_SITE_OTHER): Payer: Self-pay | Admitting: Family Medicine

## 2022-05-15 NOTE — Telephone Encounter (Signed)
Pt checking status of receipt of form for correction.  Pt says last year it took our office 3 times to get it correct: provider sign, date, and answer YES to authorize refills. [place] form URGENT for them to expedite, fax to [phone]

## 2022-05-16 ENCOUNTER — Encounter (INDEPENDENT_AMBULATORY_CARE_PROVIDER_SITE_OTHER): Payer: Self-pay | Admitting: *Deleted

## 2022-05-16 NOTE — Telephone Encounter (Signed)
Patient came into office and dropped off the forms. Forms were faxed, patient sent to message and originals left up front for patient to pick up.

## 2022-05-18 ENCOUNTER — Encounter (INDEPENDENT_AMBULATORY_CARE_PROVIDER_SITE_OTHER): Payer: Self-pay | Admitting: Family Medicine

## 2022-05-18 ENCOUNTER — Telehealth (INDEPENDENT_AMBULATORY_CARE_PROVIDER_SITE_OTHER): Payer: Self-pay | Admitting: Family Medicine

## 2022-05-18 DIAGNOSIS — E1169 Type 2 diabetes mellitus with other specified complication: Secondary | ICD-10-CM

## 2022-05-18 MED ORDER — OZEMPIC (0.25 OR 0.5 MG/DOSE) 2 MG/1.5ML ~~LOC~~ SOPN: 0.5000 mg | PEN_INJECTOR | SUBCUTANEOUS | 2 refills | Status: DC

## 2022-05-18 NOTE — Telephone Encounter (Signed)
Pt insurance called to see if we can refax the order for Ozempic.

## 2022-05-21 ENCOUNTER — Encounter (INDEPENDENT_AMBULATORY_CARE_PROVIDER_SITE_OTHER): Payer: Self-pay | Admitting: Family Medicine

## 2022-05-21 NOTE — Progress Notes (Incomplete)
Chief Complaint:   OBESITY Ezri is here to discuss his progress with his obesity treatment plan along with follow-up of his obesity related diagnoses. Lenox is on {MWMwtlossportion/plan2:23431} and states he is following his eating plan approximately ***% of the time. Tavion states he is *** *** minutes *** times per week.  Today's visit was #: *** Starting weight: *** Starting date: *** Today's weight: *** Today's date: 05/11/2022 Total lbs lost to date: *** Total lbs lost since last in-office visit: ***  Interim History: ***  Subjective:   1. Controlled type 2 diabetes mellitus with other specified complication, unspecified whether long term insulin use ([facility]) ***  2. Hypertension, unspecified type ***  Assessment/Plan:   1. Controlled type 2 diabetes mellitus with other specified complication, unspecified whether long term insulin use ([facility]CC) ***  2. Hypertension, unspecified type ***  3. Obesity, Current BMI 33.3 Tejon is currently in the action stage of change. As such, his goal is to continue with weight loss efforts. He has agreed to the Category 3 Plan.   Exercise goals: As is.   Behavioral modification strategies: emotional eating strategies.  Fernandez has agreed to follow-up with our clinic in 4 to 5 weeks. He was informed of the importance of frequent follow-up visits to maximize his success with intensive lifestyle modifications for his multiple health conditions.   Objective:   Blood pressure 107/66, pulse 78, temperature (!) 97.4 F (36.3 C), height '5\' 9"'$  (1.753 m), weight 225 lb (102.1 kg), SpO2 97 %. Body mass index is 33.23 kg/m.  General: Cooperative, alert, well developed, in no acute distress. HEENT: Conjunctivae and lids unremarkable. Cardiovascular: Regular rhythm.  Lungs: Normal work of breathing. Neurologic: No focal deficits.   Lab Results  Component Value Date   CREATININE 1.37 (H) 12/08/2021   BUN 28 (H) 12/08/2021   NA 146 (H)  12/08/2021   K 4.8 12/08/2021   CL 103 12/08/2021   CO2 21 12/08/2021   Lab Results  Component Value Date   ALT 30 12/08/2021   AST 32 12/08/2021   ALKPHOS 47 12/08/2021   BILITOT 0.4 12/08/2021   Lab Results  Component Value Date   HGBA1C 5.9 (H) 12/08/2021   HGBA1C 5.3 03/15/2020   HGBA1C 5.7 (H) 06/19/2019   HGBA1C 5.5 11/21/2017   HGBA1C 5.8 (H) 06/18/2017   Lab Results  Component Value Date   INSULIN 20.2 12/08/2021   INSULIN 5.0 03/15/2020   INSULIN 8.5 06/19/2019   INSULIN 8.6 11/21/2017   INSULIN 16.5 06/18/2017   Lab Results  Component Value Date   TSH 2.80 04/18/2021   Lab Results  Component Value Date   CHOL 210 (H) 12/08/2021   HDL 61 12/08/2021   LDLCALC 126 (H) 12/08/2021   LDLDIRECT 135.0 07/16/2013   TRIG 133 12/08/2021   CHOLHDL 2.7 06/09/2020   Lab Results  Component Value Date   VD25OH 58.7 12/08/2021   VD25OH 89.1 03/15/2020   VD25OH 78.1 06/19/2019   Lab Results  Component Value Date   WBC 4.6 04/18/2021   HGB 15.3 04/18/2021   HCT 44.5 04/18/2021   MCV 96.2 04/18/2021   PLT 225.0 04/18/2021   No results found for: "IRON", "TIBC", "FERRITIN"  Obesity Behavioral Intervention:   Approximately 15 minutes were spent on the discussion below.  ASK: We discussed the diagnosis of obesity with [name] today and [name] agreed to give [name] permission to discuss obesity behavioral modification therapy today.  ASSESS: Rex has the diagnosis of  obesity and his BMI today is 33.3. Zenith is in the action stage of change.   ADVISE: Quinterrius was educated on the multiple health risks of obesity as well as the benefit of weight loss to improve his health. He was advised of the need for long term treatment and the importance of lifestyle modifications to improve his current health and to decrease his risk of future health problems.  AGREE: Multiple dietary modification options and treatment options were discussed and Kaydenn agreed to follow the  recommendations documented in the above note.  ARRANGE: Asif was educated on the importance of frequent visits to treat obesity as outlined per CMS and USPSTF guidelines and agreed to schedule his next follow up appointment today.  Attestation Statements:   Reviewed by clinician on day of visit: allergies, medications, problem list, medical history, surgical history, family history, social history, and previous encounter notes.   I, Trixie Dredge, am acting as transcriptionist for Dennard Nip, MD.  I have reviewed the above documentation for accuracy and completeness, and I agree with the above. -  ***

## 2022-05-21 NOTE — Progress Notes (Unsigned)
Chief Complaint:   OBESITY [name] [name] is here to discuss his progress with his obesity treatment plan along with follow-up of his obesity related diagnoses. [name] [name] is on the Category 3 Plan and states he is following his eating plan approximately 50% of the time. [name] [name] states he is walking for 90 minutes 5-7 times per week.  Today's visit was #: 74 Starting weight: 230 lbs Starting date: 06/18/2017 Today's weight: 225 lbs Today's date: 05/11/2022 Total lbs lost to date: 5 Total lbs lost since last in-office visit: 0  Interim History: [name] [name] has done well with maintaining his weight.  He has had more migraines and more traveling.  He noted some increase stress eating, especially with ice cream.  Subjective:   1. Controlled type 2 diabetes mellitus with other specified complication, unspecified whether long term insulin use ([name] [name]) [name] [name] is on Ozempic but he has had a difficult time getting it regularly.  He would like his medications sent to [facility].  2. Hypertension, unspecified type [name] [name] is doing very well with exercise and maintaining his weight loss.  He has no signs of hypotension, and he denies chest pain.  Assessment/Plan:   1. Controlled type 2 diabetes mellitus with other specified complication, unspecified whether long term insulin use ([name] [name]) [name] [name] will continue Ozempic 0.5 mg once weekly, and we will refill for 90 days.  2. Hypertension, unspecified type [name] [name] will continue with his diet, exercise, and medications, and we will continue to follow.  3. Obesity, Current BMI 33.3 [name] [name] is currently in the action stage of change. As such, his goal is to continue with weight loss efforts. He has agreed to the Category 3 Plan.   Exercise goals: As is.   Behavioral modification strategies: emotional eating strategies.  [name] [name] has agreed to follow-up with our clinic in 4 to 5 weeks. He was informed of the importance of frequent follow-up visits to maximize his success with  intensive lifestyle modifications for his multiple health conditions.   Objective:   Blood pressure 107/66, pulse 78, temperature (!) 97.4 F (36.3 C), height '5\' 9"'$  (1.753 m), weight 225 lb (102.1 kg), SpO2 97 %. Body mass index is 33.23 kg/m.  General: Cooperative, alert, well developed, in no acute distress. HEENT: Conjunctivae and lids unremarkable. Cardiovascular: Regular rhythm.  Lungs: Normal work of breathing. Neurologic: No focal deficits.   Lab Results  Component Value Date   CREATININE 1.37 (H) 12/08/2021   BUN 28 (H) 12/08/2021   NA 146 (H) 12/08/2021   K 4.8 12/08/2021   CL 103 12/08/2021   CO2 21 12/08/2021   Lab Results  Component Value Date   ALT 30 12/08/2021   AST 32 12/08/2021   ALKPHOS 47 12/08/2021   BILITOT 0.4 12/08/2021   Lab Results  Component Value Date   HGBA1C 5.9 (H) 12/08/2021   HGBA1C 5.3 03/15/2020   HGBA1C 5.7 (H) 06/19/2019   HGBA1C 5.5 11/21/2017   HGBA1C 5.8 (H) 06/18/2017   Lab Results  Component Value Date   INSULIN 20.2 12/08/2021   INSULIN 5.0 03/15/2020   INSULIN 8.5 06/19/2019   INSULIN 8.6 11/21/2017   INSULIN 16.5 06/18/2017   Lab Results  Component Value Date   TSH 2.80 04/18/2021   Lab Results  Component Value Date   CHOL 210 (H) 12/08/2021   HDL 61 12/08/2021   LDLCALC 126 (H) 12/08/2021   LDLDIRECT 135.0 07/16/2013   TRIG 133 12/08/2021   CHOLHDL 2.7 06/09/2020   Lab Results  Component  Value Date   VD25OH 58.7 12/08/2021   VD25OH 89.1 03/15/2020   VD25OH 78.1 06/19/2019   Lab Results  Component Value Date   WBC 4.6 04/18/2021   HGB 15.3 04/18/2021   HCT 44.5 04/18/2021   MCV 96.2 04/18/2021   PLT 225.0 04/18/2021   No results found for: "IRON", "TIBC", "FERRITIN"  Obesity Behavioral Intervention:   Approximately 15 minutes were spent on the discussion below.  ASK: We discussed the diagnosis of obesity with [name] [name] today and [name] [name] agreed to give [name] permission to discuss obesity behavioral  modification therapy today.  ASSESS: [name] [name] has the diagnosis of obesity and his BMI today is 33.3. [name] [name] is in the action stage of change.   ADVISE: [name] [name] was educated on the multiple health risks of obesity as well as the benefit of weight loss to improve his health. He was advised of the need for long term treatment and the importance of lifestyle modifications to improve his current health and to decrease his risk of future health problems.  AGREE: Multiple dietary modification options and treatment options were discussed and [name] [name] agreed to follow the recommendations documented in the above note.  ARRANGE: [name] [name] was educated on the importance of frequent visits to treat obesity as outlined per CMS and USPSTF guidelines and agreed to schedule his next follow up appointment today.  Attestation Statements:   Reviewed by clinician on day of visit: allergies, medications, problem list, medical history, surgical history, family history, social history, and previous encounter notes.   I, Trixie Dredge, am acting as transcriptionist for Dennard Nip, MD.  I have reviewed the above documentation for accuracy and completeness, and I agree with the above. -  Dennard Nip, MD

## 2022-05-30 ENCOUNTER — Encounter: Payer: Self-pay | Admitting: *Deleted

## 2022-05-30 NOTE — Progress Notes (Unsigned)
PATIENT: America Brown DOB: 21-Aug-1951  REASON FOR VISIT: follow up HISTORY FROM: patient  Virtual Visit via Video Note  I connected with America Brown on 05/31/22 at  8:45 AM EDT by a video enabled telemedicine application located remotely at [facility] and verified that I am speaking with the correct person using two identifiers who was located at their own home.   I discussed the limitations of evaluation and management by telemedicine and the availability of in person appointments. The patient expressed understanding and agreed to proceed.   PATIENT: America Brown DOB: 11-30-1950  REASON FOR VISIT: follow up HISTORY FROM: patient Primary neurologist: Dr. Lucia Gaskins Sleep neurologist: Dr. Frances Furbish  HISTORY OF PRESENT ILLNESS: Today 05/31/22 :  Mr. Erdmann is a 71 year old male with a history of OSA on CPAP and Migraine headaches. He returns today for follow-up. CPAP is working well. Some nights he takes the mask off unknowingly. Also reports that recently if he only gets about 5 hours of sleep.  He likes to wake up early to go for walks.  Tends to go to bed late.  In regards to his migraines he reports that he has not had a migraine in 6 weeks.  He remains on Cymbalta, gabapentin, tizanidine, amitriptyline and Ajovy.  He reports that he has reduced his medication.  He is only taking tizanidine at bedtime and gabapentin at bedtime.  He is nervous to further reduce his medication for the fear of having his migraines return.     04/14/21: Mr. Zabaleta is a 71 year old male with a history of obstructive sleep apnea on CPAP.  He reports that the CPAP continues to work well for him.  He states on occasion there may be a night that he falls asleep without putting it on.  There is also some nights that he may take it off and go back to sleep.  He states that his migraines were cut in half once he started using the CPAP.  He reports his migraines have been under  relatively good control.  He remains on amitriptyline, gabapentin, tizanidine, Cymbalta and Ajovy for prevention.  He continues to use sumatriptan hand for abortive therapy.    01/01/20: Mr. Britnell is a 71 year old male with a history of obstructive sleep apnea on CPAP.  He returns today for follow-up.  His download indicates that he uses his machine nightly for compliance of 100%.  He uses machine greater than 4 hours 25 days for compliance of 83%.  On average he uses his machine 5 hours and 54 minutes.  His residual AHI is 3.7 on 7 to 14 cm of water with EPR of 3.  Leak in the 95th percentile is 17.4 L/min.  He reports that the CPAP is working well for him.  He denies any new issues.  HISTORY 12/26/2018: Please also see below for documentation of the virtual visit.   I reviewed his AutoPap compliance data from 11/24/2018 through 12/23/2018 which is a total of 30 days, during which time he used his machine every night with percent used days greater than 4 hours at 90%, indicating excellent compliance with an average usage of 6 hours and 4 minutes, residual AHI at goal at 4.2 per hour, 95th percentile of pressure at 12.6 cm, leak on the higher end with the 95th percentile at 18.9 L/m on a pressure range of 7 cm to 14 cm with EPR.   The patient's allergies, current medications,  family history, past medical history, past social history, past surgical history and problem list were reviewed and updated as appropriate.     REVIEW OF SYSTEMS: Out of a complete 14 system review of symptoms, the patient complains only of the following symptoms, and all other reviewed systems are negative.  See HPI  ALLERGIES: Allergies  Allergen Reactions   Shrimp [Shellfish Allergy] Anaphylaxis and Rash   Codeine Rash and Other (See Comments)    Blacked out   Contrast Media [Iodinated Contrast Media] Rash   Diazepam Rash    Rash over whole body from neck down, and he lost his voice (airway compromise?)    Amoxicillin Other (See Comments)    Unknown    Benadryl [Diphenhydramine]     With dyes    Depakote [Divalproex Sodium] Swelling    Per pts report (given by Dr. Lesia Hausen).  Has throat swelling and rash.   Erythromycin Other (See Comments)    unknown   Flexeril [Cyclobenzaprine]    Allegra [Fexofenadine] Rash        Betadine [Povidone Iodine] Rash   Celebrex [Celecoxib] Rash   Meperidine And Related Rash   Methocarbamol Rash   Neosporin [Neomycin-Bacitracin Zn-Polymyx] Rash   Oxycodone Rash   Penicillins Rash   Plavix [Clopidogrel] Rash   Rivaroxaban Rash   Vioxx [Rofecoxib] Rash    HOME MEDICATIONS: Outpatient Medications Prior to Visit  Medication Sig Dispense Refill   allopurinol (ZYLOPRIM) 100 MG tablet TAKE 1 TABLET EVERY DAY 90 tablet 1   amitriptyline (ELAVIL) 100 MG tablet TAKE 1 TABLET AT BEDTIME 90 tablet 0   aspirin 81 MG tablet Take 81 mg by mouth daily.     azithromycin (ZITHROMAX) 250 MG tablet Take 2 tabs day 1, then 1 tab daily 6 each 0   benzonatate (TESSALON) 200 MG capsule Take 1 capsule (200 mg total) by mouth 2 (two) times daily as needed for cough. 30 capsule 0   Cholecalciferol (VITAMIN D PO) Take 1 tablet by mouth daily.     Coenzyme Q10 (CO Q 10 PO) Take by mouth daily.     Cyanocobalamin (VITAMIN B 12 PO) Take by mouth.     doxycycline (VIBRA-TABS) 100 MG tablet Take 1 tablet (100 mg total) by mouth 2 (two) times daily. 20 tablet 0   DULoxetine (CYMBALTA) 60 MG capsule TAKE 1 CAPSULE EVERY DAY 90 capsule 3   EPINEPHRINE 0.3 mg/0.3 mL IJ SOAJ injection INJECT 0.3 MLS INTO THE MUSCLE ONCE 2 each 0   fluticasone (FLONASE) 50 MCG/ACT nasal spray Place 1 spray into both nostrils daily. START with twice a day dosing for 1 week, then reduce to daily dosing until symptoms are resolved. 16 g 6   Fremanezumab-vfrm (AJOVY) 225 MG/1.5ML SOAJ Inject 225 mg into the skin every 30 (thirty) days. 4.5 mL 3   gabapentin (NEURONTIN) 300 MG capsule Take 1 capsule (300  mg total) by mouth 2 (two) times daily. 180 capsule 3   Multiple Vitamin (MULTIVITAMIN) tablet Take 1 tablet by mouth daily.     naproxen (NAPROSYN) 500 MG tablet Take 500 mg by mouth 2 (two) times daily with a meal.     Omega-3 Fatty Acids (FISH OIL) 1000 MG CAPS Take 1 capsule by mouth daily.     ondansetron (ZOFRAN ODT) 4 MG disintegrating tablet Take 1 tablet (4 mg total) by mouth every 4 (four) hours as needed for nausea or vomiting. 20 tablet 0   oxyCODONE-acetaminophen (PERCOCET) 5-325 MG tablet Take 1-2  tablets by mouth every 6 (six) hours as needed. 20 tablet 0   pravastatin (PRAVACHOL) 40 MG tablet TAKE 1 TABLET (40 MG TOTAL) BY MOUTH DAILY. 90 tablet 3   predniSONE (DELTASONE) 50 MG tablet Take 1 tablet daily for 5 days. 5 tablet 0   Psyllium (METAMUCIL PO) Take 2 tablets by mouth 2 (two) times daily.     Red Yeast Rice Extract (RED YEAST RICE PO) Take 1 tablet by mouth daily.     Semaglutide,0.25 or 0.5MG /DOS, (OZEMPIC, 0.25 OR 0.5 MG/DOSE,) 2 MG/1.5ML SOPN Inject 0.5 mg into the skin once a week. 4.5 mL 2   SUMAtriptan (IMITREX) 100 MG tablet TAKE 1 TABLET AS NEEDED FOR MIGRAINE. MAY REPEAT IN 2 HOURS IF HEADACHE PERSISTS OR RECURS. MAX 2 TABLETS IN 24 HOURS 27 tablet 3   tamsulosin (FLOMAX) 0.4 MG CAPS capsule Take 1 capsule (0.4 mg total) by mouth daily. 14 capsule 0   tamsulosin (FLOMAX) 0.4 MG CAPS capsule Take 1 capsule (0.4 mg total) by mouth daily. 30 capsule 0   tiZANidine (ZANAFLEX) 4 MG tablet TAKE 1 TABLET THREE TIMES DAILY AS NEEDED FOR MUSCLE SPASM(S) 270 tablet 0   TURMERIC CURCUMIN PO Take 1,050 mg by mouth daily.     valACYclovir (VALTREX) 500 MG tablet Take 500 mg by mouth daily.     vitamin C (ASCORBIC ACID) 500 MG tablet Take 500 mg by mouth daily.     vitamin E 1000 UNIT capsule Take 1,000 Units by mouth daily.     Facility-Administered Medications Prior to Visit  Medication Dose Route Frequency Provider Last Rate Last Admin   0.9 %  sodium chloride infusion   500 mL Intravenous Once Meryl Dare, MD        PAST MEDICAL HISTORY: Past Medical History:  Diagnosis Date   ALLERGIC RHINITIS 04/22/2007   Allergy    Cancer (HCC)    HX SKIN CANCER   Complication of anesthesia    more concerned about positioning of head & neck because of TMJ   DDD (degenerative disc disease), cervical    GERD (gastroesophageal reflux disease)    infrequently   Headache(784.0)    migraines   History of kidney stones    History of skin cancer    History of TMJ disorder    HYPERCHOLESTEROLEMIA 04/22/2007   HYPERLIPIDEMIA 07/29/2007   Hyperplastic colon polyp    NEPHROLITHIASIS, HX OF 07/29/2007   Osteoarth NOS-Unspec 04/22/2007   Shortness of breath    Shoulder pain, acute    bilateral    PAST SURGICAL HISTORY: Past Surgical History:  Procedure Laterality Date   APPENDECTOMY     BACK SURGERY  10/07/2015   COLONOSCOPY     10 yrs ago- 2 HPP    COLONOSCOPY  2019   JOINT REPLACEMENT     KNEE ARTHROSCOPY     right x2   SHOULDER SURGERY     right   skin cancer biopsy     L forehead   TOTAL KNEE ARTHROPLASTY Right 08/25/2013   Procedure: RIGHT TOTAL KNEE ARTHROPLASTY;  Surgeon: Loanne Drilling, MD;  Location: [facility];  Service: Orthopedics;  Laterality: Right;   TOTAL SHOULDER ARTHROPLASTY Left 12/25/2013   DR SUPPLE    TOTAL SHOULDER ARTHROPLASTY Left 12/25/2013   Procedure: LEFT TOTAL SHOULDER ARTHROPLASTY;  Surgeon: Senaida Lange, MD;  Location: [facility];  Service: Orthopedics;  Laterality: Left;   TOTAL SHOULDER ARTHROPLASTY Right 06/18/2014   DR SUPPLE   TOTAL  SHOULDER ARTHROPLASTY Right 06/18/2014   Procedure: RIGHT TOTAL SHOULDER ARTHROPLASTY;  Surgeon: Senaida Lange, MD;  Location: [facility];  Service: Orthopedics;  Laterality: Right;   WISDOM TOOTH EXTRACTION      FAMILY HISTORY: Family History  Problem Relation Age of Onset   COPD Father        family hx   Emphysema Father    Aneurysm Mother    Stroke Mother    Sudden death Mother    Cancer  Mother    Obesity Mother    Breast cancer Mother    Cancer Maternal Grandfather        lung   Colon cancer Neg Hx    Colon polyps Neg Hx     SOCIAL HISTORY: Social History   Socioeconomic History   Marital status: Married    Spouse name: Not on file   Number of children: 1   Years of education: 16+   Highest education level: Bachelor's degree (e.g., BA, AB, BS)  Occupational History   Occupation: Retired  Tobacco Use   Smoking status: Never   Smokeless tobacco: Never  Vaping Use   Vaping Use: Never used  Substance and Sexual Activity   Alcohol use: No    Alcohol/week: 0.0 standard drinks of alcohol   Drug use: No   Sexual activity: Yes    Partners: Female    Birth control/protection: None  Other Topics Concern   Not on file  Social History Narrative   Lives at home w/ his wife   Right-handed   Caffeine: 2 cups of coffee each morning   Social Determinants of Health   Financial Resource Strain: Low Risk  (08/16/2021)   Overall Financial Resource Strain (CARDIA)    Difficulty of Paying Living Expenses: Not hard at all  Food Insecurity: No Food Insecurity (10/07/2021)   Hunger Vital Sign    Worried About Running Out of Food in the Last Year: Never true    Ran Out of Food in the Last Year: Never true  Transportation Needs: No Transportation Needs (10/07/2021)   PRAPARE - Administrator, Civil Service (Medical): No    Lack of Transportation (Non-Medical): No  Physical Activity: Sufficiently Active (10/07/2021)   Exercise Vital Sign    Days of Exercise per Week: 7 days    Minutes of Exercise per Session: 60 min  Stress: No Stress Concern Present (10/07/2021)   [employer] of Occupational Health - Occupational Stress Questionnaire    Feeling of Stress : Not at all  Social Connections: Moderately Integrated (10/07/2021)   Social Connection and Isolation Panel [NHANES]    Frequency of Communication with Friends and Family: Three times a week     Frequency of Social Gatherings with Friends and Family: Once a week    Attends Religious Services: 1 to 4 times per year    Active Member of [employer] or Organizations: No    Attends Banker Meetings: Never    Marital Status: Married  Recent Concern: Social Connections - Moderately Isolated (08/16/2021)   Social Connection and Isolation Panel [NHANES]    Frequency of Communication with Friends and Family: More than three times a week    Frequency of Social Gatherings with Friends and Family: More than three times a week    Attends Religious Services: Never    Database administrator or Organizations: No    Attends Banker Meetings: Never    Marital Status: Married  Catering manager  Violence: Not At Risk (08/16/2021)   Humiliation, Afraid, Rape, and Kick questionnaire    Fear of Current or Ex-Partner: No    Emotionally Abused: No    Physically Abused: No    Sexually Abused: No      PHYSICAL EXAM Generalized: Well developed, in no acute distress   Neurological examination  Mentation: Alert oriented to time, place, history taking. Follows all commands speech and language fluent Cranial nerve II-XII:Extraocular movements were full. Facial symmetry noted. Head turning and shoulder shrug  were normal and symmetric.  Reflexes: UTA  DIAGNOSTIC DATA (LABS, IMAGING, TESTING) - I reviewed patient records, labs, notes, testing and imaging myself where available.  Lab Results  Component Value Date   WBC 4.6 04/18/2021   HGB 15.3 04/18/2021   HCT 44.5 04/18/2021   MCV 96.2 04/18/2021   PLT 225.0 04/18/2021      Component Value Date/Time   NA 146 (H) 12/08/2021 1134   K 4.8 12/08/2021 1134   CL 103 12/08/2021 1134   CO2 21 12/08/2021 1134   GLUCOSE 99 12/08/2021 1134   GLUCOSE 117 (H) 04/18/2021 0920   GLUCOSE 94 08/20/2006 0830   BUN 28 (H) 12/08/2021 1134   CREATININE 1.37 (H) 12/08/2021 1134   CREATININE 1.38 (H) 06/09/2020 0811   CALCIUM 9.0 12/08/2021  1134   PROT 6.6 12/08/2021 1134   ALBUMIN 4.4 12/08/2021 1134   AST 32 12/08/2021 1134   ALT 30 12/08/2021 1134   ALKPHOS 47 12/08/2021 1134   BILITOT 0.4 12/08/2021 1134   GFRNONAA 58 (L) 04/08/2021 2009   GFRAA 78 08/17/2020 1011   Lab Results  Component Value Date   CHOL 210 (H) 12/08/2021   HDL 61 12/08/2021   LDLCALC 126 (H) 12/08/2021   LDLDIRECT 135.0 07/16/2013   TRIG 133 12/08/2021   CHOLHDL 2.7 06/09/2020   Lab Results  Component Value Date   HGBA1C 5.9 (H) 12/08/2021   No results found for: "VITAMINB12" Lab Results  Component Value Date   TSH 2.80 04/18/2021      ASSESSMENT AND PLAN 71 y.o. year old male  has a past medical history of ALLERGIC RHINITIS (04/22/2007), Allergy, Cancer (HCC), Complication of anesthesia, DDD (degenerative disc disease), cervical, GERD (gastroesophageal reflux disease), Headache(784.0), History of kidney stones, History of skin cancer, History of TMJ disorder, HYPERCHOLESTEROLEMIA (04/22/2007), HYPERLIPIDEMIA (07/29/2007), Hyperplastic colon polyp, NEPHROLITHIASIS, HX OF (07/29/2007), Osteoarth NOS-Unspec (04/22/2007), Shortness of breath, and Shoulder pain, acute. here with:  1.  Obstructive sleep apnea on CPAP  -Good compliance -Good treatment of apnea -Encouraged to use CPAP nightly and greater than 4 hours each night  2.  Migraine headaches  -Continue amitriptyline 100 mg at bedtime -Continue gabapentin 300 mg At bedtime -Continue Cymbalta 60 mg daily -Continue Ajovy monthly injection -Continue sumatriptan for abortive therapy -He will eliminate tizanidine at bedtime and let us know in a month how he is doing.  At that time if his migraines are still under good control we will reduce Cymbalta to 30 mg daily.  He will follow-up in 6 months or sooner if needed     Butch Penny, MSN, NP-C 05/31/2022, 8:51 AM [facility] [address] [place], [place] [address] [phone]     agree with  assessment and plan as stated.     Naomie Dean, MD [facility]

## 2022-05-31 ENCOUNTER — Telehealth: Payer: Medicare HMO | Admitting: Adult Health

## 2022-05-31 ENCOUNTER — Encounter: Payer: Self-pay | Admitting: Adult Health

## 2022-05-31 DIAGNOSIS — Z9989 Dependence on other enabling machines and devices: Secondary | ICD-10-CM | POA: Diagnosis not present

## 2022-05-31 DIAGNOSIS — G43109 Migraine with aura, not intractable, without status migrainosus: Secondary | ICD-10-CM | POA: Diagnosis not present

## 2022-05-31 DIAGNOSIS — G4733 Obstructive sleep apnea (adult) (pediatric): Secondary | ICD-10-CM | POA: Diagnosis not present

## 2022-05-31 MED ORDER — GABAPENTIN 300 MG PO CAPS: 300.0000 mg | ORAL_CAPSULE | Freq: Every day | ORAL | 3 refills | Status: DC

## 2022-06-01 ENCOUNTER — Other Ambulatory Visit: Payer: Self-pay | Admitting: Neurology

## 2022-06-01 DIAGNOSIS — G43109 Migraine with aura, not intractable, without status migrainosus: Secondary | ICD-10-CM

## 2022-06-15 ENCOUNTER — Encounter (INDEPENDENT_AMBULATORY_CARE_PROVIDER_SITE_OTHER): Payer: Self-pay | Admitting: Family Medicine

## 2022-06-15 ENCOUNTER — Ambulatory Visit (INDEPENDENT_AMBULATORY_CARE_PROVIDER_SITE_OTHER): Payer: Medicare HMO | Admitting: Family Medicine

## 2022-06-15 VITALS — BP 101/64 | HR 91 | Temp 97.6°F | Ht 69.0 in | Wt 211.0 lb

## 2022-06-15 DIAGNOSIS — E1169 Type 2 diabetes mellitus with other specified complication: Secondary | ICD-10-CM | POA: Diagnosis not present

## 2022-06-15 DIAGNOSIS — E669 Obesity, unspecified: Secondary | ICD-10-CM

## 2022-06-15 DIAGNOSIS — G43909 Migraine, unspecified, not intractable, without status migrainosus: Secondary | ICD-10-CM | POA: Insufficient documentation

## 2022-06-15 DIAGNOSIS — G43809 Other migraine, not intractable, without status migrainosus: Secondary | ICD-10-CM | POA: Diagnosis not present

## 2022-06-15 DIAGNOSIS — Z7985 Long-term (current) use of injectable non-insulin antidiabetic drugs: Secondary | ICD-10-CM

## 2022-06-15 DIAGNOSIS — Z6831 Body mass index (BMI) 31.0-31.9, adult: Secondary | ICD-10-CM | POA: Diagnosis not present

## 2022-06-20 NOTE — Progress Notes (Unsigned)
Chief Complaint:   OBESITY [name] [name] is here to discuss his progress with his obesity treatment plan along with follow-up of his obesity related diagnoses. [name] [name] is on the Category 3 Plan and states he is following his eating plan approximately 50% of the time. [name] [name] states he is walking 6 miles for 1.5 hours 7 times per week.    Today's visit was #: 42 Starting weight: 230 lbs Starting date: 06/18/2017 Today's weight: 211 lbs Today's date: 06/15/2022 Total lbs lost to date: 19 Total lbs lost since last in-office visit: 14  Interim History: [name] [name] has done better with his weight loss efforts recently. He has increased his exercise. He is doing better with following his plan but sometimes eats larger portions than on his plan.   Subjective:   1. Type 2 diabetes mellitus with other specified complication, unspecified whether long term insulin use (HCC) [name] [name] has struggled to get his Ozempic due to a series of mistakes with his patient assistance paperwork. He is back on his Ozempic and he is doing better with following his meal plan. His only nausea is after eating too much ice cream.   2. Other migraine without status migrainosus, not intractable [name] [name] notes significant decrease in migraines with none in the morning the last month, which is very good for him. He has decreased his medications.   Assessment/Plan:   1. Type 2 diabetes mellitus with other specified complication, unspecified whether long term insulin use ([place]) [name] [name] will continue Ozempic and we will continue to manage this and fill out the patient assistance forms as needed.   2. Other migraine without status migrainosus, not intractable [name] [name] is to continue his medications and hydration, and he will continue his diet and exercise.   3. Obesity,current BMI 31.2 [name] [name] is currently in the action stage of change. As such, his goal is to continue with weight loss efforts. He has agreed to the Category 3 Plan.   Exercise  goals: As is.   Behavioral modification strategies: decreasing simple carbohydrates and meal planning and cooking strategies.  [name] [name] has agreed to follow-up with our clinic in 4 weeks. He was informed of the importance of frequent follow-up visits to maximize his success with intensive lifestyle modifications for his multiple health conditions.   Objective:   Blood pressure 101/64, pulse 91, temperature 97.6 F (36.4 C), height '5\' 9"'$  (1.753 m), weight 211 lb (95.7 kg), SpO2 94 %. Body mass index is 31.16 kg/m.  General: Cooperative, alert, well developed, in no acute distress. HEENT: Conjunctivae and lids unremarkable. Cardiovascular: Regular rhythm.  Lungs: Normal work of breathing. Neurologic: No focal deficits.   Lab Results  Component Value Date   CREATININE 1.37 (H) 12/08/2021   BUN 28 (H) 12/08/2021   NA 146 (H) 12/08/2021   K 4.8 12/08/2021   CL 103 12/08/2021   CO2 21 12/08/2021   Lab Results  Component Value Date   ALT 30 12/08/2021   AST 32 12/08/2021   ALKPHOS 47 12/08/2021   BILITOT 0.4 12/08/2021   Lab Results  Component Value Date   HGBA1C 5.9 (H) 12/08/2021   HGBA1C 5.3 03/15/2020   HGBA1C 5.7 (H) 06/19/2019   HGBA1C 5.5 11/21/2017   HGBA1C 5.8 (H) 06/18/2017   Lab Results  Component Value Date   INSULIN 20.2 12/08/2021   INSULIN 5.0 03/15/2020   INSULIN 8.5 06/19/2019   INSULIN 8.6 11/21/2017   INSULIN 16.5 06/18/2017   Lab Results  Component Value Date  TSH 2.80 04/18/2021   Lab Results  Component Value Date   CHOL 210 (H) 12/08/2021   HDL 61 12/08/2021   LDLCALC 126 (H) 12/08/2021   LDLDIRECT 135.0 07/16/2013   TRIG 133 12/08/2021   CHOLHDL 2.7 06/09/2020   Lab Results  Component Value Date   VD25OH 58.7 12/08/2021   VD25OH 89.1 03/15/2020   VD25OH 78.1 06/19/2019   Lab Results  Component Value Date   WBC 4.6 04/18/2021   HGB 15.3 04/18/2021   HCT 44.5 04/18/2021   MCV 96.2 04/18/2021   PLT 225.0 04/18/2021   No results  found for: "IRON", "TIBC", "FERRITIN"  Attestation Statements:   Reviewed by clinician on day of visit: allergies, medications, problem list, medical history, surgical history, family history, social history, and previous encounter notes.  I have personally spent 47 minutes total time today in preparation, patient care, and documentation for this visit, including the following: review of clinical lab tests; review of medical tests/procedures/services.    I, Trixie Dredge, am acting as transcriptionist for Dennard Nip, MD.  I have reviewed the above documentation for accuracy and completeness, and I agree with the above. -  Dennard Nip, MD

## 2022-06-27 ENCOUNTER — Telehealth: Payer: Self-pay | Admitting: Family Medicine

## 2022-06-27 NOTE — Telephone Encounter (Signed)
Pt is calling and would like to know if he need to get PNA vaccine. Pt is aware md out of office this week

## 2022-07-05 NOTE — Telephone Encounter (Signed)
Pt is good on pna vaccines.  Had the PCV 13 on 06/12/16 and the pneumovax 23 on 07/12/17.

## 2022-07-06 ENCOUNTER — Other Ambulatory Visit: Payer: Self-pay

## 2022-07-06 ENCOUNTER — Encounter (HOSPITAL_BASED_OUTPATIENT_CLINIC_OR_DEPARTMENT_OTHER): Payer: Self-pay

## 2022-07-06 ENCOUNTER — Emergency Department (HOSPITAL_BASED_OUTPATIENT_CLINIC_OR_DEPARTMENT_OTHER)
Admission: EM | Admit: 2022-07-06 | Discharge: 2022-07-06 | Disposition: A | Discharge status: Home / Self Care | Payer: Medicare HMO | Attending: Emergency Medicine | Admitting: Emergency Medicine

## 2022-07-06 ENCOUNTER — Emergency Department (HOSPITAL_BASED_OUTPATIENT_CLINIC_OR_DEPARTMENT_OTHER): Payer: Medicare HMO

## 2022-07-06 DIAGNOSIS — N132 Hydronephrosis with renal and ureteral calculous obstruction: Secondary | ICD-10-CM | POA: Insufficient documentation

## 2022-07-06 DIAGNOSIS — N200 Calculus of kidney: Secondary | ICD-10-CM

## 2022-07-06 DIAGNOSIS — R109 Unspecified abdominal pain: Secondary | ICD-10-CM | POA: Diagnosis present

## 2022-07-06 DIAGNOSIS — K760 Fatty (change of) liver, not elsewhere classified: Secondary | ICD-10-CM | POA: Diagnosis not present

## 2022-07-06 DIAGNOSIS — Z7982 Long term (current) use of aspirin: Secondary | ICD-10-CM | POA: Insufficient documentation

## 2022-07-06 LAB — URINALYSIS, ROUTINE W REFLEX MICROSCOPIC
Bilirubin Urine: NEGATIVE
Glucose, UA: NEGATIVE mg/dL
Ketones, ur: NEGATIVE mg/dL
Leukocytes,Ua: NEGATIVE
Nitrite: NEGATIVE
RBC / HPF: >50 RBC/hpf — ABNORMAL HIGH (ref 0–5)
Specific Gravity, Urine: 1.023 (ref 1.005–1.030)
pH: 6 (ref 5.0–8.0)

## 2022-07-06 LAB — CBC
HCT: 45.1 % (ref 39.0–52.0)
Hemoglobin: 15.5 g/dL (ref 13.0–17.0)
MCH: 32.5 pg (ref 26.0–34.0)
MCHC: 34.4 g/dL (ref 30.0–36.0)
MCV: 94.5 fL (ref 80.0–100.0)
Platelets: 247 10*3/uL (ref 150–400)
RBC: 4.77 MIL/uL (ref 4.22–5.81)
RDW: 13.2 % (ref 11.5–15.5)
WBC: 10 10*3/uL (ref 4.0–10.5)
nRBC: 0 % (ref 0.0–0.2)

## 2022-07-06 LAB — BASIC METABOLIC PANEL
Anion gap: 9 (ref 5–15)
BUN: 24 mg/dL — ABNORMAL HIGH (ref 8–23)
CO2: 25 mmol/L (ref 22–32)
Calcium: 9.7 mg/dL (ref 8.9–10.3)
Chloride: 99 mmol/L (ref 98–111)
Creatinine, Ser: 1.35 mg/dL — ABNORMAL HIGH (ref 0.61–1.24)
GFR, Estimated: 56 mL/min — ABNORMAL LOW (ref >60)
Glucose, Bld: 108 mg/dL — ABNORMAL HIGH (ref 70–99)
Potassium: 4.6 mmol/L (ref 3.5–5.1)
Sodium: 133 mmol/L — ABNORMAL LOW (ref 135–145)

## 2022-07-06 MED ORDER — ONDANSETRON 4 MG PO TBDP: ORAL_TABLET | ORAL | 0 refills | Status: DC

## 2022-07-06 MED ORDER — OXYCODONE-ACETAMINOPHEN 5-325 MG PO TABS: 1.0000 | ORAL_TABLET | Freq: Four times a day (QID) | ORAL | 0 refills | Status: DC | PRN

## 2022-07-06 MED ORDER — MORPHINE SULFATE (PF) 4 MG/ML IV SOLN
4.0000 mg | Freq: Once | INTRAVENOUS | Status: AC
  Administered 2022-07-06: 4 mg via INTRAVENOUS
  Filled 2022-07-06: qty 1

## 2022-07-06 MED ORDER — HYDROMORPHONE HCL 1 MG/ML IJ SOLN
0.5000 mg | Freq: Once | INTRAMUSCULAR | Status: AC
  Administered 2022-07-06: 0.5 mg via INTRAVENOUS
  Filled 2022-07-06: qty 1

## 2022-07-06 MED ORDER — ONDANSETRON HCL 4 MG/2ML IJ SOLN
4.0000 mg | Freq: Once | INTRAMUSCULAR | Status: AC
  Administered 2022-07-06: 4 mg via INTRAVENOUS
  Filled 2022-07-06: qty 2

## 2022-07-06 MED ORDER — KETOROLAC TROMETHAMINE 15 MG/ML IJ SOLN
15.0000 mg | Freq: Once | INTRAMUSCULAR | Status: AC
Start: 2022-07-06 — End: 2022-07-06
  Administered 2022-07-06: 15 mg via INTRAVENOUS
  Filled 2022-07-06: qty 1

## 2022-07-06 NOTE — ED Provider Notes (Signed)
[facility] Provider Note   CSN: [id number] Arrival date & time: 07/06/22  1516     History  Chief Complaint  Patient presents with   Flank Pain    [name] [name] is a 71 y.o. male.  Patient is a 71 year old male who presents with right flank pain.  He has known history of prior kidney stones but he says his last 1 was about 15 months ago.  He had an onset of pain in his right back area around 9:00 this morning.  Its been fairly constant since then.  He has had associated nausea and vomiting and diaphoresis.  No known fevers.  No difficulty with urination.       Home Medications Prior to Admission medications   Medication Sig Start Date End Date Taking? Authorizing Provider  ondansetron (ZOFRAN-ODT) 4 MG disintegrating tablet '4mg'$  ODT q4 hours prn nausea/vomit 07/06/22  Yes Malvin Johns, MD  oxyCODONE-acetaminophen (PERCOCET/ROXICET) 5-325 MG tablet Take 1 tablet by mouth every 6 (six) hours as needed for severe pain. 07/06/22  Yes Malvin Johns, MD  allopurinol (ZYLOPRIM) 100 MG tablet TAKE 1 TABLET EVERY DAY 03/23/22   [name], MD  amitriptyline (ELAVIL) 100 MG tablet TAKE 1 TABLET AT BEDTIME 05/01/22   Ward Givens, NP  aspirin 81 MG tablet Take 81 mg by mouth daily.    [provider]  azithromycin (ZITHROMAX) 250 MG tablet Take 2 tabs day 1, then 1 tab daily 09/27/21   Vivi Barrack, MD  benzonatate (TESSALON) 200 MG capsule Take 1 capsule (200 mg total) by mouth 2 (two) times daily as needed for cough. 10/10/21   [name], MD  Cholecalciferol (VITAMIN D PO) Take 1 tablet by mouth daily.    [provider]  clindamycin (CLEOCIN) 300 MG capsule Take 300 mg by mouth 2 (two) times daily. 06/14/22   [provider]  Coenzyme Q10 (CO Q 10 PO) Take by mouth daily.    [provider]  Cyanocobalamin (VITAMIN B 12 PO) Take by mouth.    [provider]  doxycycline (VIBRA-TABS) 100 MG tablet  Take 1 tablet (100 mg total) by mouth 2 (two) times daily. 09/06/21   Inda Coke, PA  DULoxetine (CYMBALTA) 60 MG capsule TAKE 1 CAPSULE EVERY DAY 03/13/22   Ward Givens, NP  EPINEPHRINE 0.3 mg/0.3 mL IJ SOAJ injection INJECT 0.3 MLS INTO THE MUSCLE ONCE 03/23/22   [name], MD  fluticasone (FLONASE) 50 MCG/ACT nasal spray Place 1 spray into both nostrils daily. START with twice a day dosing for 1 week, then reduce to daily dosing until symptoms are resolved. 09/22/21   Jeanie Sewer, NP  Fremanezumab-vfrm (AJOVY) 225 MG/1.5ML SOAJ Inject 225 mg into the skin every 30 (thirty) days. 10/25/21   Melvenia Beam, MD  gabapentin (NEURONTIN) 300 MG capsule Take 1 capsule (300 mg total) by mouth at bedtime. 06/06/22   Ward Givens, NP  Multiple Vitamin (MULTIVITAMIN) tablet Take 1 tablet by mouth daily.    [provider]  naproxen (NAPROSYN) 500 MG tablet Take 500 mg by mouth 2 (two) times daily with a meal.    [provider]  Omega-3 Fatty Acids (FISH OIL) 1000 MG CAPS Take 1 capsule by mouth daily.    [provider]  pravastatin (PRAVACHOL) 40 MG tablet TAKE 1 TABLET (40 MG TOTAL) BY MOUTH DAILY. 10/28/21   [name], MD  predniSONE (DELTASONE) 50 MG tablet Take 1 tablet daily  for 5 days. 09/27/21   Vivi Barrack, MD  Psyllium (METAMUCIL PO) Take 2 tablets by mouth 2 (two) times daily.    [provider]  Red Yeast Rice Extract (RED YEAST RICE PO) Take 1 tablet by mouth daily.    [provider]  Semaglutide,0.25 or 0.'5MG'$ /DOS, (OZEMPIC, 0.25 OR 0.5 MG/DOSE,) 2 MG/1.5ML SOPN Inject 0.5 mg into the skin once a week. 05/18/22   Dennard Nip D, MD  SUMAtriptan (IMITREX) 100 MG tablet TAKE 1 TABLET AS NEEDED FOR MIGRAINE. MAY REPEAT IN 2 HOURS IF HEADACHE PERSISTS OR RECURS. MAX 2 TABLETS IN 24 HOURS 04/10/22   Ward Givens, NP  tamsulosin (FLOMAX) 0.4 MG CAPS capsule Take 1 capsule (0.4 mg total) by mouth daily. 04/09/21   Charlesetta Shanks, MD  tamsulosin (FLOMAX) 0.4 MG CAPS capsule Take 1 capsule (0.4 mg total) by mouth daily. 04/18/21   [name], MD  tiZANidine (ZANAFLEX) 4 MG tablet TAKE 1 TABLET THREE TIMES DAILY AS NEEDED FOR MUSCLE SPASM(S) 03/23/22   Ward Givens, NP  TURMERIC CURCUMIN PO Take 1,050 mg by mouth daily.    [provider]  valACYclovir (VALTREX) 500 MG tablet Take 500 mg by mouth daily.    [provider]  vitamin C (ASCORBIC ACID) 500 MG tablet Take 500 mg by mouth daily.    [provider]  vitamin E 1000 UNIT capsule Take 1,000 Units by mouth daily.    [provider]      Allergies    Shrimp [shellfish allergy], Codeine, Contrast media [iodinated contrast media], Diazepam, Amoxicillin, Benadryl [diphenhydramine], Depakote [divalproex sodium], Erythromycin, Flexeril [cyclobenzaprine], Allegra [fexofenadine], Betadine [povidone iodine], Celebrex [celecoxib], Meperidine and related, Methocarbamol, Neosporin [neomycin-bacitracin zn-polymyx], Penicillins, Plavix [clopidogrel], Rivaroxaban, and Vioxx [rofecoxib]    Review of Systems   Review of Systems  Constitutional:  Positive for diaphoresis. Negative for chills, fatigue and fever.  HENT:  Negative for congestion, rhinorrhea and sneezing.   Eyes: Negative.   Respiratory:  Negative for cough, chest tightness and shortness of breath.   Cardiovascular:  Negative for chest pain and leg swelling.  Gastrointestinal:  Positive for abdominal pain, nausea and vomiting. Negative for blood in stool and diarrhea.  Genitourinary:  Negative for difficulty urinating, flank pain, frequency and hematuria.  Musculoskeletal:  Positive for back pain. Negative for arthralgias.  Skin:  Negative for rash.  Neurological:  Negative for dizziness, speech difficulty, weakness, numbness and headaches.    Physical Exam Updated Vital Signs BP 127/67 (BP Location: Right Arm)   Pulse 73   Temp 97.7 F (36.5 C) (Oral)   Resp 18    Ht '5\' 9"'$  (1.753 m)   Wt 95.7 kg   SpO2 93%   BMI 31.16 kg/m  Physical Exam Constitutional:      Appearance: He is well-developed.     Comments: Appears uncomfortable  HENT:     Head: Normocephalic and atraumatic.  Eyes:     Pupils: Pupils are equal, round, and reactive to light.  Cardiovascular:     Rate and Rhythm: Normal rate and regular rhythm.     Heart sounds: Normal heart sounds.  Pulmonary:     Effort: Pulmonary effort is normal. No respiratory distress.     Breath sounds: Normal breath sounds. No wheezing or rales.  Chest:     Chest wall: No tenderness.  Abdominal:     General: Bowel sounds are normal.     Palpations: Abdomen is soft.  Tenderness: There is abdominal tenderness (Positive tenderness to the right flank). There is no guarding or rebound.  Musculoskeletal:        General: Normal range of motion.     Cervical back: Normal range of motion and neck supple.  Lymphadenopathy:     Cervical: No cervical adenopathy.  Skin:    General: Skin is warm and dry.     Findings: No rash.  Neurological:     Mental Status: He is alert and oriented to person, place, and time.     ED Results / Procedures / Treatments   Labs (all labs ordered are listed, but only abnormal results are displayed) Labs Reviewed  URINALYSIS, ROUTINE W REFLEX MICROSCOPIC - Abnormal; Notable for the following components:      Result Value   Hgb urine dipstick MODERATE (*)    Protein, ur TRACE (*)    RBC / HPF >50 (*)    All other components within normal limits  BASIC METABOLIC PANEL - Abnormal; Notable for the following components:   Sodium 133 (*)    Glucose, Bld 108 (*)    BUN 24 (*)    Creatinine, Ser 1.35 (*)    GFR, Estimated 56 (*)    All other components within normal limits  CBC    EKG None  Radiology CT Renal Stone Study  Result Date: 07/06/2022 CLINICAL DATA:  Right flank pain, nausea, and vomiting. EXAM: CT ABDOMEN AND PELVIS WITHOUT CONTRAST TECHNIQUE:  Multidetector CT imaging of the abdomen and pelvis was performed following the standard protocol without IV contrast. RADIATION DOSE REDUCTION: This exam was performed according to the departmental dose-optimization program which includes automated exposure control, adjustment of the mA and/or kV according to patient size and/or use of iterative reconstruction technique. COMPARISON:  CT abdomen and pelvis 04/08/2021 FINDINGS: Lower chest: Clear lung bases. Hepatobiliary: Generalized decreased attenuation of the liver compatible with steatosis which has a less heterogeneous appearance than on the prior study. Similar appearance of scattered focal hypodense liver lesions most likely reflecting cysts. Unremarkable gallbladder. No biliary dilatation. Pancreas: Unremarkable. Spleen: Unremarkable. Adrenals/Urinary Tract: Unremarkable adrenal glands. 7 mm calculus in the proximal right ureter, potentially reflecting interval migration of a lower pole calculus on the prior CT, with associated mild right hydronephrosis. Two unchanged small nonobstructing calculi in the lower pole of the right kidney measuring up to 3 mm. 2 mm calculus in the lower pole of the left kidney. Resolved left-sided hydronephrosis. Unremarkable bladder. Stomach/Bowel: The stomach is unremarkable. There is no evidence of bowel obstruction. A moderate amount of stool is noted in the right colon. There is mild scattered left-sided colonic diverticulosis without evidence of diverticulitis. History of appendectomy. Vascular/Lymphatic: Mild abdominal aortic atherosclerosis. Reproductive: Unremarkable prostate. Other: No ascites or pneumoperitoneum. Musculoskeletal: Prior L4-5 posterior and interbody fusion with advanced disc and facet degeneration elsewhere in the lumbar spine. IMPRESSION: 1. 7 mm proximal right ureteral calculus with mild hydronephrosis. 2. Small bilateral nonobstructing renal calculi. 3. Hepatic steatosis. 4. Colonic diverticulosis. 5.   Aortic Atherosclerosis (ICD10-I70.0). Electronically Signed   By: Logan Bores M.D.   On: 07/06/2022 19:03    Procedures Procedures    Medications Ordered in ED Medications  morphine (PF) 4 MG/ML injection 4 mg (4 mg Intravenous Given 07/06/22 1818)  ondansetron (ZOFRAN) injection 4 mg (4 mg Intravenous Given 07/06/22 1817)  HYDROmorphone (DILAUDID) injection 0.5 mg (0.5 mg Intravenous Given 07/06/22 1904)  ketorolac (TORADOL) 15 MG/ML injection 15 mg (15 mg Intravenous Given 07/06/22  [email or web address])    ED Course/ Medical Decision Making/ A&P                           Medical Decision Making Amount and/or Complexity of Data Reviewed Labs: ordered. Radiology: ordered.  Risk Prescription drug management.   Patient is a 71 year old male who presents with pain in his right flank.  He has a prior history of kidney stones.  His urinalysis was checked which shows hematuria but no suggestions of infection.  Labs show mild elevation in his creatinine but on chart review, appears to be similar to prior values.  He had a CT scan of his abdomen and pelvis which showed a 7 mm stone in his right proximal ureter.  He was given pain medications and antiemetics in the ED.  He is feeling much better after this.  He has no ongoing vomiting or pain.  At this point he seems appropriate for outpatient treatment.  Discussion was made with him and his wife about calling a urologist for close follow-up.  He has Flomax at home to use which she will use for the next week once daily.  This was a prior prescription he had received for a kidney stone.  We will give him a prescription for short course of oxycodone and Zofran.  Of note, initially one of his allergies was listed is oxycodone.  However he reports that he took it last time without any issue when he actually took one of the pills today without any issue.  This allergy was removed from his allergy list per his request.  He was discharged home in good condition.  Return  precautions were given.  Final Clinical Impression(s) / ED Diagnoses Final diagnoses:  Kidney stone    Rx / DC Orders ED Discharge Orders          Ordered    oxyCODONE-acetaminophen (PERCOCET/ROXICET) 5-325 MG tablet  Every 6 hours PRN        07/06/22 2008    ondansetron (ZOFRAN-ODT) 4 MG disintegrating tablet        07/06/22 2008              Malvin Johns, MD 07/06/22 2010

## 2022-07-06 NOTE — ED Triage Notes (Signed)
Patient here POV from Home.  Endorses Right Flank Pain since 0900. Constant and associated with N/V.  Uncomfortable during Triage. A&Ox4. GCS 15. BIB Wheelchair.

## 2022-07-06 NOTE — Discharge Instructions (Addendum)
Take the tamsulosin that you already have once daily for the next 7 days or stop it if you passed the kidney stone.  Follow-up with the urologist as discussed.  Return to emergency room if you have any worsening symptoms such as worsening pain, fevers, difficulty urinating, vomiting or other worsening symptoms.

## 2022-07-07 ENCOUNTER — Ambulatory Visit: Payer: Medicare HMO | Admitting: Family Medicine

## 2022-07-07 DIAGNOSIS — N201 Calculus of ureter: Secondary | ICD-10-CM | POA: Diagnosis not present

## 2022-07-13 ENCOUNTER — Telehealth: Payer: Self-pay

## 2022-07-13 DIAGNOSIS — N201 Calculus of ureter: Secondary | ICD-10-CM | POA: Diagnosis not present

## 2022-07-13 NOTE — Telephone Encounter (Signed)
---  Caller says that her husband has kidney stones since Thursday AM. He saw a urologist. Pt was placed on pain medication, flomax and one other medication. And he has extreme ABD pain after having constipation due to the narcotics. Pt started on milk of magnesia. Pt had not had a bowel movement since last wednesday. Pt was on mag citrate yesterday. Pt did have a bowel movement today. pt has a follow up appt on friday. Pt also has the kidney stone pain as well. Current pain 07/10 is in his lower abdomen and comes and goes. Pt stopped his narcotics today.   07/11/2022 4:54:29 PM See HCP within 4 Hours (or PCP triage) Noralee Stain, RN, [place]  Comments User: Aletta Edouard, RN Date/Time Eilene Ghazi Time): 07/11/2022 4:56:15 PM Office closes in five minutes. Pt referred to UC or ED  Referrals GO TO FACILITY UNDECIDED

## 2022-07-14 ENCOUNTER — Other Ambulatory Visit: Payer: Self-pay | Admitting: Urology

## 2022-07-18 NOTE — Progress Notes (Signed)
COVID Vaccine Completed:  Yes  Date of COVID positive in last 90 days:  PCP - Grier Mitts, MD Cardiologist -   Chest x-ray - 09-27-21 Epic EKG -  Stress Test -  ECHO -  Cardiac Cath -  Pacemaker/ICD device last checked: Spinal Cord Stimulator:  Bowel Prep -   Sleep Study -  CPAP -   Fasting Blood Sugar -  Checks Blood Sugar _____ times a day  Blood Thinner Instructions: Aspirin Instructions: Last Dose:  Activity level:  Can go up a flight of stairs and perform activities of daily living without stopping and without symptoms of chest pain or shortness of breath.  Able to exercise without symptoms  Unable to go up a flight of stairs without symptoms of     Anesthesia review:   Patient denies shortness of breath, fever, cough and chest pain at PAT appointment  Patient verbalized understanding of instructions that were given to them at the PAT appointment. Patient was also instructed that they will need to review over the PAT instructions again at home before surgery.

## 2022-07-18 NOTE — Patient Instructions (Signed)
SURGICAL WAITING ROOM VISITATION Patients having surgery or a procedure may have no more than 2 support people in the waiting area - these visitors may rotate.   Children under the age of 39 must have an adult with them who is not the patient. If the patient needs to stay at the hospital during part of their recovery, the visitor guidelines for inpatient rooms apply. Pre-op nurse will coordinate an appropriate time for 1 support person to accompany patient in pre-op.  This support person may not rotate.    Please refer to the [facility] website for the visitor guidelines for Inpatients (after your surgery is over and you are in a regular room).      Your procedure is scheduled on: 07-26-22   Report to [facility] Main Entrance    Report to admitting at 8:15 AM   Call this number if you have problems the morning of surgery [phone]   Do not eat food or drink liquids :After Midnight.          If you have questions, please contact your surgeon's office.   FOLLOW  ANY ADDITIONAL PRE OP INSTRUCTIONS YOU RECEIVED FROM YOUR SURGEON'S OFFICE!!!     Oral Hygiene is also important to reduce your risk of infection.                                    Remember - BRUSH YOUR TEETH THE MORNING OF SURGERY WITH YOUR REGULAR TOOTHPASTE   Do NOT smoke after Midnight   Take these medicines the morning of surgery with A SIP OF WATER:   Allopurinol  Duloxetine  Gabapentin  Pravastatin  Tamsulosin  Ondansetron if needed  Oxycodone if needed  How to Manage Your Diabetes Before and After Surgery  Why is it important to control my blood sugar before and after surgery? Improving blood sugar levels before and after surgery helps healing and can limit problems. A way of improving blood sugar control is eating a healthy diet by:  Eating less sugar and carbohydrates  Increasing activity/exercise  Talking with your doctor about reaching your blood sugar goals High blood sugars (greater  than 180 mg/dL) can raise your risk of infections and slow your recovery, so you will need to focus on controlling your diabetes during the weeks before surgery. Make sure that the doctor who takes care of your diabetes knows about your planned surgery including the date and location.  How do I manage my blood sugar before surgery? Check your blood sugar at least 4 times a day, starting 2 days before surgery, to make sure that the level is not too high or low. Check your blood sugar the morning of your surgery when you wake up and every 2 hours until you get to the Short Stay unit. If your blood sugar is less than 70 mg/dL, you will need to treat for low blood sugar: Do not take insulin. Treat a low blood sugar (less than 70 mg/dL) with  cup of clear juice (cranberry or apple), 4 glucose tablets, OR glucose gel. Recheck blood sugar in 15 minutes after treatment (to make sure it is greater than 70 mg/dL). If your blood sugar is not greater than 70 mg/dL on recheck, call [phone] for further instructions. Report your blood sugar to the short stay nurse when you get to Short Stay.  If you are admitted to the hospital after surgery: Your  blood sugar will be checked by the staff and you will probably be given insulin after surgery (instead of oral diabetes medicines) to make sure you have good blood sugar levels. The goal for blood sugar control after surgery is 80-180 mg/dL.   WHAT DO I DO ABOUT MY DIABETES MEDICATION?  Do not take oral diabetes medicines (pills) the morning of surgery.  Hold Semaglutide 7 days before surgery  Reviewed and Endorsed by [facility] Patient Education Committee, August 2015   Bring CPAP mask and tubing day of surgery.                              You may not have any metal on your body including jewelry, and body piercing             Do not wear lotions, powders, cologne, or deodorant              Men may shave face and neck.   Do not bring valuables to  the hospital. [place].   Contacts, dentures or bridgework may not be worn into surgery.  DO NOT [place]. PHARMACY WILL DISPENSE MEDICATIONS LISTED ON YOUR MEDICATION LIST TO YOU DURING YOUR ADMISSION [place]!    Patients discharged on the day of surgery will not be allowed to drive home.  Someone NEEDS to stay with you for the first 24 hours after anesthesia.   Special Instructions: Bring a copy of your healthcare power of attorney and living will documents the day of surgery if you haven't scanned them before.              Please read over the following fact sheets you were given: IF [name] [name]  If you received a COVID test during your pre-op visit  it is requested that you wear a mask when out in public, stay away from anyone that may not be feeling well and notify your surgeon if you develop symptoms. If you test positive for Covid or have been in contact with anyone that has tested positive in the last 10 days please notify you surgeon.  [place] - Preparing for Surgery Before surgery, you can play an important role.  Because skin is not sterile, your skin needs to be as free of germs as possible.  You can reduce the number of germs on your skin by washing with CHG (chlorahexidine gluconate) soap before surgery.  CHG is an antiseptic cleaner which kills germs and bonds with the skin to continue killing germs even after washing. Please DO NOT use if you have an allergy to CHG or antibacterial soaps.  If your skin becomes reddened/irritated stop using the CHG and inform your nurse when you arrive at Short Stay. Do not shave (including legs and underarms) for at least 48 hours prior to the first CHG shower.  You may shave your face/neck.  Please follow these instructions carefully:  1.  Shower with CHG Soap the night before surgery and the   morning of surgery.  2.  If you choose to wash your hair, wash your hair first as usual with your normal  shampoo.  3.  After you shampoo, rinse your hair and body thoroughly to remove the shampoo.  4.  Use CHG as you would any other liquid soap.  You can apply chg directly to the skin and wash.  Gently with a scrungie or clean washcloth.  5.  Apply the CHG Soap to your body ONLY FROM THE NECK DOWN.   Do   not use on face/ open                           Wound or open sores. Avoid contact with eyes, ears mouth and   genitals (private parts).                       Wash face,  Genitals (private parts) with your normal soap.             6.  Wash thoroughly, paying special attention to the area where your    surgery  will be performed.  7.  Thoroughly rinse your body with warm water from the neck down.  8.  DO NOT shower/wash with your normal soap after using and rinsing off the CHG Soap.                9.  Pat yourself dry with a clean towel.            10.  Wear clean pajamas.            11.  Place clean sheets on your bed the night of your first shower and do not  sleep with pets. Day of Surgery : Do not apply any lotions/deodorants the morning of surgery.  Please wear clean clothes to the hospital/surgery center.  FAILURE TO FOLLOW THESE INSTRUCTIONS MAY RESULT IN THE CANCELLATION OF YOUR SURGERY  PATIENT SIGNATURE_________________________________  NURSE SIGNATURE__________________________________  ________________________________________________________________________

## 2022-07-19 ENCOUNTER — Other Ambulatory Visit: Payer: Self-pay

## 2022-07-19 ENCOUNTER — Encounter (HOSPITAL_COMMUNITY): Payer: Self-pay

## 2022-07-19 ENCOUNTER — Encounter (HOSPITAL_COMMUNITY)
Admission: RE | Admit: 2022-07-19 | Discharge: 2022-07-19 | Disposition: A | Discharge status: Home / Self Care | Payer: Medicare HMO | Source: Ambulatory Visit | Attending: Urology | Admitting: Urology

## 2022-07-19 VITALS — BP 151/93 | HR 66 | Temp 97.9°F | Resp 16 | Ht 69.0 in | Wt 224.2 lb

## 2022-07-19 DIAGNOSIS — I2510 Atherosclerotic heart disease of native coronary artery without angina pectoris: Secondary | ICD-10-CM | POA: Insufficient documentation

## 2022-07-19 DIAGNOSIS — Z01818 Encounter for other preprocedural examination: Secondary | ICD-10-CM | POA: Insufficient documentation

## 2022-07-19 DIAGNOSIS — E119 Type 2 diabetes mellitus without complications: Secondary | ICD-10-CM | POA: Diagnosis not present

## 2022-07-19 HISTORY — DX: Pneumonia, unspecified organism: J18.9

## 2022-07-19 LAB — GLUCOSE, CAPILLARY: Glucose-Capillary: 98 mg/dL (ref 70–99)

## 2022-07-19 LAB — HEMOGLOBIN A1C
Hgb A1c MFr Bld: 5.4 % (ref 4.8–5.6)
Mean Plasma Glucose: 108.28 mg/dL

## 2022-07-20 ENCOUNTER — Encounter (INDEPENDENT_AMBULATORY_CARE_PROVIDER_SITE_OTHER): Payer: Self-pay | Admitting: Family Medicine

## 2022-07-20 NOTE — Telephone Encounter (Signed)
Please advise 

## 2022-07-24 ENCOUNTER — Ambulatory Visit (INDEPENDENT_AMBULATORY_CARE_PROVIDER_SITE_OTHER): Payer: Medicare HMO | Admitting: Family Medicine

## 2022-07-25 ENCOUNTER — Encounter: Payer: Self-pay | Admitting: Adult Health

## 2022-07-26 ENCOUNTER — Ambulatory Visit (HOSPITAL_COMMUNITY)
Admission: RE | Admit: 2022-07-26 | Discharge: 2022-07-26 | Disposition: A | Discharge status: Home / Self Care | Payer: Medicare HMO | Attending: Urology | Admitting: Urology

## 2022-07-26 ENCOUNTER — Telehealth: Payer: Self-pay | Admitting: *Deleted

## 2022-07-26 ENCOUNTER — Encounter (HOSPITAL_COMMUNITY)
Admission: RE | Disposition: A | Discharge status: Home / Self Care | Payer: Self-pay | Source: Home / Self Care | Attending: Urology

## 2022-07-26 ENCOUNTER — Ambulatory Visit (HOSPITAL_BASED_OUTPATIENT_CLINIC_OR_DEPARTMENT_OTHER): Payer: Medicare HMO | Admitting: Certified Registered Nurse Anesthetist

## 2022-07-26 ENCOUNTER — Ambulatory Visit (HOSPITAL_COMMUNITY): Payer: Medicare HMO | Admitting: Certified Registered Nurse Anesthetist

## 2022-07-26 ENCOUNTER — Encounter (HOSPITAL_COMMUNITY): Payer: Self-pay | Admitting: Urology

## 2022-07-26 ENCOUNTER — Other Ambulatory Visit: Payer: Self-pay | Admitting: *Deleted

## 2022-07-26 ENCOUNTER — Ambulatory Visit (HOSPITAL_COMMUNITY): Payer: Medicare HMO

## 2022-07-26 DIAGNOSIS — N400 Enlarged prostate without lower urinary tract symptoms: Secondary | ICD-10-CM | POA: Diagnosis not present

## 2022-07-26 DIAGNOSIS — G4730 Sleep apnea, unspecified: Secondary | ICD-10-CM | POA: Insufficient documentation

## 2022-07-26 DIAGNOSIS — N202 Calculus of kidney with calculus of ureter: Secondary | ICD-10-CM | POA: Insufficient documentation

## 2022-07-26 DIAGNOSIS — Z7985 Long-term (current) use of injectable non-insulin antidiabetic drugs: Secondary | ICD-10-CM | POA: Insufficient documentation

## 2022-07-26 DIAGNOSIS — K219 Gastro-esophageal reflux disease without esophagitis: Secondary | ICD-10-CM | POA: Insufficient documentation

## 2022-07-26 DIAGNOSIS — I10 Essential (primary) hypertension: Secondary | ICD-10-CM | POA: Diagnosis not present

## 2022-07-26 DIAGNOSIS — N138 Other obstructive and reflux uropathy: Secondary | ICD-10-CM | POA: Diagnosis not present

## 2022-07-26 DIAGNOSIS — E119 Type 2 diabetes mellitus without complications: Secondary | ICD-10-CM | POA: Insufficient documentation

## 2022-07-26 HISTORY — PX: CYSTOSCOPY/URETEROSCOPY/HOLMIUM LASER/STENT PLACEMENT: SHX6546

## 2022-07-26 LAB — GLUCOSE, CAPILLARY: Glucose-Capillary: 109 mg/dL — ABNORMAL HIGH (ref 70–99)

## 2022-07-26 SURGERY — CYSTOSCOPY/URETEROSCOPY/HOLMIUM LASER/STENT PLACEMENT
Anesthesia: General | Laterality: Right

## 2022-07-26 MED ORDER — ORAL CARE MOUTH RINSE: 15.0000 mL | Freq: Once | OROMUCOSAL | Status: AC

## 2022-07-26 MED ORDER — ONDANSETRON HCL 4 MG/2ML IJ SOLN
INTRAMUSCULAR | Status: DC | PRN
  Administered 2022-07-26: 4 mg via INTRAVENOUS

## 2022-07-26 MED ORDER — FENTANYL CITRATE PF 50 MCG/ML IJ SOSY: 25.0000 ug | PREFILLED_SYRINGE | INTRAMUSCULAR | Status: DC | PRN

## 2022-07-26 MED ORDER — NITROFURANTOIN MONOHYD MACRO 100 MG PO CAPS: 100.0000 mg | ORAL_CAPSULE | Freq: Two times a day (BID) | ORAL | 0 refills | Status: AC

## 2022-07-26 MED ORDER — MIDAZOLAM HCL 5 MG/5ML IJ SOLN
INTRAMUSCULAR | Status: DC | PRN
  Administered 2022-07-26 (×2): 1 mg via INTRAVENOUS

## 2022-07-26 MED ORDER — PROPOFOL 10 MG/ML IV BOLUS
INTRAVENOUS | Status: DC | PRN
  Administered 2022-07-26: 200 mg via INTRAVENOUS

## 2022-07-26 MED ORDER — ACETAMINOPHEN 500 MG PO TABS: 1000.0000 mg | ORAL_TABLET | Freq: Four times a day (QID) | ORAL | 1 refills | Status: AC | PRN

## 2022-07-26 MED ORDER — FENTANYL CITRATE (PF) 100 MCG/2ML IJ SOLN
INTRAMUSCULAR | Status: AC
  Filled 2022-07-26: qty 2

## 2022-07-26 MED ORDER — PROPOFOL 10 MG/ML IV BOLUS
INTRAVENOUS | Status: AC
  Filled 2022-07-26: qty 20

## 2022-07-26 MED ORDER — DEXAMETHASONE SODIUM PHOSPHATE 10 MG/ML IJ SOLN
INTRAMUSCULAR | Status: DC | PRN
  Administered 2022-07-26: 5 mg via INTRAVENOUS

## 2022-07-26 MED ORDER — ONDANSETRON HCL 4 MG/2ML IJ SOLN
INTRAMUSCULAR | Status: AC
  Filled 2022-07-26: qty 2

## 2022-07-26 MED ORDER — HYDROMORPHONE HCL 1 MG/ML IJ SOLN: 0.2500 mg | INTRAMUSCULAR | Status: DC | PRN

## 2022-07-26 MED ORDER — SODIUM CHLORIDE 0.9 % IR SOLN
Status: DC | PRN
  Administered 2022-07-26: 6000 mL via INTRAVESICAL

## 2022-07-26 MED ORDER — 0.9 % SODIUM CHLORIDE (POUR BTL) OPTIME
TOPICAL | Status: DC | PRN
  Administered 2022-07-26: 1000 mL

## 2022-07-26 MED ORDER — MIDAZOLAM HCL 2 MG/2ML IJ SOLN
INTRAMUSCULAR | Status: AC
  Filled 2022-07-26: qty 2

## 2022-07-26 MED ORDER — AJOVY 225 MG/1.5ML ~~LOC~~ SOAJ: 225.0000 mg | SUBCUTANEOUS | 3 refills | Status: DC

## 2022-07-26 MED ORDER — EPHEDRINE SULFATE-NACL 50-0.9 MG/10ML-% IV SOSY
PREFILLED_SYRINGE | INTRAVENOUS | Status: DC | PRN
  Administered 2022-07-26 (×2): 5 mg via INTRAVENOUS

## 2022-07-26 MED ORDER — LIDOCAINE 2% (20 MG/ML) 5 ML SYRINGE
INTRAMUSCULAR | Status: DC | PRN
  Administered 2022-07-26: 100 mg via INTRAVENOUS

## 2022-07-26 MED ORDER — CHLORHEXIDINE GLUCONATE 0.12 % MT SOLN
15.0000 mL | Freq: Once | OROMUCOSAL | Status: AC
  Administered 2022-07-26: 15 mL via OROMUCOSAL

## 2022-07-26 MED ORDER — PHENYLEPHRINE HCL-NACL 20-0.9 MG/250ML-% IV SOLN
INTRAVENOUS | Status: DC | PRN
  Administered 2022-07-26: 35 ug/min via INTRAVENOUS

## 2022-07-26 MED ORDER — OXYCODONE HCL 5 MG PO TABS: 5.0000 mg | ORAL_TABLET | Freq: Four times a day (QID) | ORAL | 0 refills | Status: DC | PRN

## 2022-07-26 MED ORDER — NAPROXEN 500 MG PO TABS: 500.0000 mg | ORAL_TABLET | Freq: Three times a day (TID) | ORAL | 0 refills | Status: DC

## 2022-07-26 MED ORDER — EPHEDRINE 5 MG/ML INJ
INTRAVENOUS | Status: AC
  Filled 2022-07-26: qty 5

## 2022-07-26 MED ORDER — LACTATED RINGERS IV SOLN: INTRAVENOUS | Status: DC

## 2022-07-26 MED ORDER — TAMSULOSIN HCL 0.4 MG PO CAPS: 0.4000 mg | ORAL_CAPSULE | Freq: Every day | ORAL | 0 refills | Status: AC

## 2022-07-26 MED ORDER — LIDOCAINE HCL (PF) 2 % IJ SOLN
INTRAMUSCULAR | Status: AC
  Filled 2022-07-26: qty 5

## 2022-07-26 MED ORDER — CIPROFLOXACIN IN D5W 400 MG/200ML IV SOLN
400.0000 mg | INTRAVENOUS | Status: AC
  Administered 2022-07-26: 400 mg via INTRAVENOUS
  Filled 2022-07-26: qty 200

## 2022-07-26 MED ORDER — FENTANYL CITRATE (PF) 100 MCG/2ML IJ SOLN
INTRAMUSCULAR | Status: DC | PRN
  Administered 2022-07-26: 50 ug via INTRAVENOUS
  Administered 2022-07-26 (×2): 25 ug via INTRAVENOUS

## 2022-07-26 SURGICAL SUPPLY — 24 items
BAG COUNTER SPONGE SURGICOUNT (BAG) IMPLANT
BAG URO CATCHER STRL LF (MISCELLANEOUS) ×1 IMPLANT
BASKET ZERO TIP NITINOL 2.4FR (BASKET) IMPLANT
CATH URETERAL DUAL LUMEN 10F (MISCELLANEOUS) ×1 IMPLANT
CATH URETL OPEN END 6FR 70 (CATHETERS) IMPLANT
CLOTH BEACON ORANGE TIMEOUT ST (SAFETY) ×1 IMPLANT
GLOVE SS BIOGEL STRL SZ 7 (GLOVE) ×1 IMPLANT
GLOVE SURG LX STRL 7.5 STRW (GLOVE) ×1 IMPLANT
GOWN STRL REUS W/ TWL XL LVL3 (GOWN DISPOSABLE) ×1 IMPLANT
GOWN STRL REUS W/TWL XL LVL3 (GOWN DISPOSABLE) ×1
GUIDEWIRE STR DUAL SENSOR (WIRE) ×1 IMPLANT
GUIDEWIRE ZIPWRE .038 STRAIGHT (WIRE) ×1 IMPLANT
IV NS 1000ML (IV SOLUTION) ×1
IV NS 1000ML BAXH (IV SOLUTION) ×1 IMPLANT
KIT TURNOVER KIT A (KITS) IMPLANT
LASER FIB FLEXIVA PULSE ID 365 (Laser) IMPLANT
MANIFOLD NEPTUNE II (INSTRUMENTS) ×1 IMPLANT
PACK CYSTO (CUSTOM PROCEDURE TRAY) ×1 IMPLANT
SHEATH NAVIGATOR HD 11/13X36 (SHEATH) IMPLANT
STENT URET 6FRX26 CONTOUR (STENTS) IMPLANT
TRACTIP FLEXIVA PULS ID 200XHI (Laser) IMPLANT
TRACTIP FLEXIVA PULSE ID 200 (Laser)
TUBING CONNECTING 10 (TUBING) ×1 IMPLANT
TUBING UROLOGY SET (TUBING) ×1 IMPLANT

## 2022-07-26 NOTE — Op Note (Signed)
Operative Note  Preoperative diagnosis:  1.  Obstructive Right ureteral stone  Postoperative diagnosis: 1.  Right ureteral and renal stone  Procedure(s): 1.  Right ureteroscopy with laser lithotripsy and basket extraction of stones 2. Cystoscopy  3. Right ureteral stent placement 5. Fluoroscopy with intraoperative interpretation  Surgeon: Donald Pore, MD  Assistants:  None  Anesthesia:  General  Complications:  None  EBL:  Minimal  Specimens: 1. stones for stone analysis (to be done at [facility])  Drains/Catheters: 1.  Right 6Fr x 26cm ureteral stent with tether string  Intraoperative findings:   Cystoscopy demonstrated mild prostate enlargement Right Ureteroscopy demonstrated a stone in the distal ureter and right kidney Successful stent placement. Retrograde pyelogram NOT performed due to allergy to contrast  Indication:  [name] [name] is a 71 y.o. male with an obstructing right ureteral stone  Description of procedure: After informed consent was obtained from the patient, the patient was identified and taken to the operating room and placed in the supine position.  General anesthesia was administered as well as perioperative IV antibiotics.  At the beginning of the case, a time-out was performed to properly identify the patient, the surgery to be performed, and the surgical site.  Sequential compression devices were applied to the lower extremities at the beginning of the case for DVT prophylaxis.  The patient was then placed in the dorsal lithotomy supine position, prepped and draped in sterile fashion.  Preliminary scout fluoroscopy did not identify an obvious calculus. We then passed the 21-French rigid cystoscope through the urethra and into the bladder under vision without any difficulty, noting a normal urethra without strictures and a mildly obstructing prostate.  A systematic evaluation of the bladder revealed no evidence of any suspicious bladder lesions.   Ureteral orifices were in normal position.    we then passed a 0.038 glide wire up to the level of the renal pelvis.  The cystoscope was withdrawn, and a dual lumen catheter was inserted over the glide wire into the distal ureter. A 0.038 sensor wire was then passed up to the level of the renal pelvis and secured to the drape as a safety wire. The dual lumen was removed.  An 11/13Fr ureteral access sheath was carefully advanced into the distal ureter where it was deployed. The flexible ureteroscope was advanced into the distal ureter and the calculus was identified. Using the 272 micron holmium laser fiber, the stone was fragmented completely. A 2.2 Fr zero tip basket was used to remove the fragments under visual guidance. These were sent for chemical analysis. The ureteroscope was then advanced into the kidney. There was a small stone which was fragmented and fragments again removed with a 0-tip basket. we evaluated the calyces systematically. We encountered no further stones.  The calyces were re-inspected and there were no significant stone fragment residual.  We then withdrew the ureteroscope back down the ureter along with the access sheath, noting no evidence of any stones along the course of the ureter.  Prior to removing the ureteroscope, we did pass the Glidewire back up to the ureter to the renal pelvis.  Once the ureteroscope was removed, we then used the Glidewire under fluoroscopic guidance and passed up a 6-French x 26 cm double-pigtail ureteral stent up the ureter, making sure that the proximal and distal ends coiled within the kidney and bladder respectively.  Note that we left a tether string attached to the distal end of the ureteral stent and it exited the  urethral meatus and was secured to the penile shaft.  The cystoscope was then advanced back into the bladder under vision.  We were able to see the distal stent coiling nicely within the bladder.  The bladder was then emptied with  irrigation solution.  The cystoscope was then removed.    The patient tolerated the procedure well and there was no complication. Patient was awoken from anesthesia and taken to the recovery room in stable condition. I was present and scrubbed for the entirety of the case.  Plan:  Patient will be discharged home. Stent may be removed Friday morning   Daryll Brod MD [facility]  Pager: [phone]

## 2022-07-26 NOTE — Anesthesia Postprocedure Evaluation (Signed)
Anesthesia Post Note  Patient: [name] [name]  Procedure(s) Performed: RIGHT URETEROSCOPY/HOLMIUM LASER/STENT PLACEMENT (Right)     Patient location during evaluation: PACU Anesthesia Type: General Level of consciousness: awake Pain management: pain level controlled Vital Signs Assessment: post-procedure vital signs reviewed and stable Respiratory status: spontaneous breathing Cardiovascular status: stable Postop Assessment: no apparent nausea or vomiting Anesthetic complications: no   No notable events documented.  Last Vitals:  Vitals:   07/26/22 1230 07/26/22 1239  BP: 110/65 126/80  Pulse: 73 66  Resp: 15 12  Temp: 37.2 C 36.9 C  SpO2: 95% 97%    Last Pain:  Vitals:   07/26/22 1259  TempSrc:   PainSc: 0-No pain                 Alima Naser

## 2022-07-26 NOTE — Anesthesia Procedure Notes (Signed)
Procedure Name: LMA Insertion Date/Time: 07/26/2022 10:39 AM  Performed by: Maxwell Caul, CRNAPre-anesthesia Checklist: Patient identified, Emergency Drugs available, Suction available and Patient being monitored Patient Re-evaluated:Patient Re-evaluated prior to induction Oxygen Delivery Method: Circle system utilized Preoxygenation: Pre-oxygenation with 100% oxygen Induction Type: IV induction LMA: LMA with gastric port inserted LMA Size: 5.0 Number of attempts: 1 Placement Confirmation: positive ETCO2 and breath sounds checked- equal and bilateral Tube secured with: Tape Dental Injury: Teeth and Oropharynx as per pre-operative assessment

## 2022-07-26 NOTE — Telephone Encounter (Signed)
Completed Ajovy PA on Cover My Meds. Key: [id number]. Awaiting determination from [facility].

## 2022-07-26 NOTE — H&P (Signed)
H&P  History of Present Illness: [name] [name] is a 71 y.o. year old with a right ureteral stone and flank pain. He was seen as an outpatient and elected to proceed with ureteroscopy with laser lithotripsy  Past Medical History:  Diagnosis Date   ALLERGIC RHINITIS 04/22/2007   Allergy    Cancer ([place])    HX SKIN CANCER   Complication of anesthesia    more concerned about positioning of head & neck because of TMJ   DDD (degenerative disc disease), cervical    GERD (gastroesophageal reflux disease)    infrequently   Headache(784.0)    migraines   History of kidney stones    History of skin cancer    History of TMJ disorder    HYPERCHOLESTEROLEMIA 04/22/2007   HYPERLIPIDEMIA 07/29/2007   Hyperplastic colon polyp    NEPHROLITHIASIS, HX OF 07/29/2007   Osteoarth NOS-Unspec 04/22/2007   Pneumonia    Shortness of breath    Shoulder pain, acute    bilateral    Past Surgical History:  Procedure Laterality Date   APPENDECTOMY     BACK SURGERY  10/07/2015   COLONOSCOPY     10 yrs ago- 2 HPP    COLONOSCOPY  2019   JOINT REPLACEMENT     KNEE ARTHROSCOPY     right x2   SHOULDER SURGERY     right   skin cancer biopsy     L forehead   TOTAL KNEE ARTHROPLASTY Right 08/25/2013   Procedure: RIGHT TOTAL KNEE ARTHROPLASTY;  Surgeon: Gearlean Alf, MD;  Location: [facility];  Service: Orthopedics;  Laterality: Right;   TOTAL SHOULDER ARTHROPLASTY Left 12/25/2013   DR SUPPLE    TOTAL SHOULDER ARTHROPLASTY Left 12/25/2013   Procedure: LEFT TOTAL SHOULDER ARTHROPLASTY;  Surgeon: Marin Shutter, MD;  Location: [facility];  Service: Orthopedics;  Laterality: Left;   TOTAL SHOULDER ARTHROPLASTY Right 06/18/2014   DR SUPPLE   TOTAL SHOULDER ARTHROPLASTY Right 06/18/2014   Procedure: RIGHT TOTAL SHOULDER ARTHROPLASTY;  Surgeon: Marin Shutter, MD;  Location: [facility];  Service: Orthopedics;  Laterality: Right;   WISDOM TOOTH EXTRACTION      Home Medications:  Current Facility-Administered Medications  for the 07/26/22 encounter [facility] Encounter)  Medication   0.9 %  sodium chloride infusion   Current Meds  Medication Sig   allopurinol (ZYLOPRIM) 100 MG tablet TAKE 1 TABLET EVERY DAY   amitriptyline (ELAVIL) 100 MG tablet TAKE 1 TABLET AT BEDTIME   DULoxetine (CYMBALTA) 60 MG capsule TAKE 1 CAPSULE EVERY DAY   EPINEPHRINE 0.3 mg/0.3 mL IJ SOAJ injection INJECT 0.3 MLS INTO THE MUSCLE ONCE   Fremanezumab-vfrm (AJOVY) 225 MG/1.5ML SOAJ Inject 225 mg into the skin every 30 (thirty) days.   gabapentin (NEURONTIN) 300 MG capsule Take 1 capsule (300 mg total) by mouth at bedtime. (Patient taking differently: Take 300 mg by mouth 2 (two) times daily as needed (pain).)   naproxen (NAPROSYN) 500 MG tablet Take 500 mg by mouth 2 (two) times daily with a meal.   NON FORMULARY Pt uses a cpap nightly   ondansetron (ZOFRAN-ODT) 4 MG disintegrating tablet '4mg'$  ODT q4 hours prn nausea/vomit   oxyCODONE-acetaminophen (PERCOCET/ROXICET) 5-325 MG tablet Take 1 tablet by mouth every 6 (six) hours as needed for severe pain.   pravastatin (PRAVACHOL) 40 MG tablet TAKE 1 TABLET (40 MG TOTAL) BY MOUTH DAILY.   Psyllium (METAMUCIL PO) Take 2 tablets by mouth 2 (two) times daily.   Semaglutide,0.25 or 0.'5MG'$ /DOS, (  OZEMPIC, 0.25 OR 0.5 MG/DOSE,) 2 MG/1.5ML SOPN Inject 0.5 mg into the skin once a week.   SUMAtriptan (IMITREX) 100 MG tablet TAKE 1 TABLET AS NEEDED FOR MIGRAINE. MAY REPEAT IN 2 HOURS IF HEADACHE PERSISTS OR RECURS. MAX 2 TABLETS IN 24 HOURS   tamsulosin (FLOMAX) 0.4 MG CAPS capsule Take 1 capsule (0.4 mg total) by mouth daily.   tiZANidine (ZANAFLEX) 4 MG tablet TAKE 1 TABLET THREE TIMES DAILY AS NEEDED FOR MUSCLE SPASM(S)   valACYclovir (VALTREX) 500 MG tablet Take 500 mg by mouth daily as needed (outbreaks).    Allergies:  Allergies  Allergen Reactions   Shrimp [Shellfish Allergy] Anaphylaxis and Rash   Codeine Rash and Other (See Comments)    Blacked out   Contrast Media [Iodinated Contrast  Media] Rash   Diazepam Rash    Rash over whole body from neck down, and he lost his voice (airway compromise?)   Amoxicillin Other (See Comments)    Unknown    Benadryl [Diphenhydramine]     With dyes    Depakote [Divalproex Sodium] Swelling    Per pts report (given by Dr. Inda Merlin).  Has throat swelling and rash.   Erythromycin Other (See Comments)    unknown   Flexeril [Cyclobenzaprine]    Allegra [Fexofenadine] Rash        Betadine [Povidone Iodine] Rash   Celebrex [Celecoxib] Rash   Meperidine And Related Rash   Methocarbamol Rash   Neosporin [Neomycin-Bacitracin Zn-Polymyx] Rash   Penicillins Rash   Plavix [Clopidogrel] Rash   Rivaroxaban Rash   Vioxx [Rofecoxib] Rash    Family History  Problem Relation Age of Onset   COPD Father        family hx   Emphysema Father    Aneurysm Mother    Stroke Mother    Sudden death Mother    Cancer Mother    Obesity Mother    Breast cancer Mother    Cancer Maternal Grandfather        lung   Colon cancer Neg Hx    Colon polyps Neg Hx     Social History:  reports that he has never smoked. He has never used smokeless tobacco. He reports that he does not drink alcohol and does not use drugs.  ROS: A complete review of systems was performed.  All systems are negative except for pertinent findings as noted.  Physical Exam:  Vital signs in last 24 hours:   Constitutional:  Alert and oriented, No acute distress Cardiovascular: Regular rate and rhythm, No JVD Respiratory: Normal respiratory effort, Lungs clear bilaterally GI: Abdomen is soft, nontender, nondistended, no abdominal masses GU: No CVA tenderness Lymphatic: No lymphadenopathy Neurologic: Grossly intact, no focal deficits Psychiatric: Normal mood and affect Drains/Tubes: none   Laboratory Data:  No results for input(s): "WBC", "HGB", "HCT", "PLT" in the last 72 hours.  No results for input(s): "NA", "K", "CL", "GLUCOSE", "BUN", "CALCIUM", "CREATININE" in the  last 72 hours.  Invalid input(s): "CO3"   No results found for this or any previous visit (from the past 24 hour(s)). No results found for this or any previous visit (from the past 240 hour(s)).  Renal Function: No results for input(s): "CREATININE" in the last 168 hours. Estimated Creatinine Clearance: 59 mL/min (A) (by C-G formula based on SCr of 1.35 mg/dL (H)).  Radiologic Imaging: No results found.  Assessment:  71 yo M with an obstructing right ureteral stone  Plan:  To OR as planned for ureteroscopy  with laser lithotripsy, stent placement. Risks of procedure reviewed (bleeding, infection, stent pain, sepsis, damage to GU tract, need for future/staged procedures, prolonged stent placement) and all questions answered.  Donald Pore, MD 07/26/2022, 8:15 AM  [facility] Pager: [phone]

## 2022-07-26 NOTE — Transfer of Care (Signed)
Immediate Anesthesia Transfer of Care Note  Patient: [name] [name]  Procedure(s) Performed: RIGHT URETEROSCOPY/HOLMIUM LASER/STENT PLACEMENT (Right)  Patient Location: PACU  Anesthesia Type:General  Level of Consciousness: awake, alert , oriented and patient cooperative  Airway & Oxygen Therapy: Patient Spontanous Breathing and Patient connected to face mask oxygen  Post-op Assessment: Report given to RN, Post -op Vital signs reviewed and stable and Patient moving all extremities  Post vital signs: Reviewed and stable  Last Vitals:  Vitals Value Taken Time  BP 139/105 07/26/22 1152  Temp    Pulse 81 07/26/22 1154  Resp 18 07/26/22 1154  SpO2 100 % 07/26/22 1154  Vitals shown include unvalidated device data.  Last Pain:  Vitals:   07/26/22 0837  TempSrc: Oral         Complications: No notable events documented.

## 2022-07-26 NOTE — Discharge Instructions (Addendum)
[facility] [phone] Post Ureteroscopy With or Without Stent Instructions *Remove stent by pulling on string Friday morning*  Definitions:  Ureter: The duct that transports urine from the kidney to the bladder. Stent:   A plastic hollow tube that is placed into the ureter, from the kidney to the bladder to prevent the ureter from swelling shut.  GENERAL INSTRUCTIONS:  Despite the fact that no skin incisions were used, the area around the ureter and bladder is raw and irritated. The stent is a foreign body which will further irritate the bladder wall. This irritation is manifested by increased frequency of urination, both day and night, and by an increase in the urge to urinate. In some, the urge to urinate is present almost always. Sometimes the urge is strong enough that you may not be able to stop yourself from urinating. The only real cure is to remove the stent and then give time for the bladder wall to heal which can't be done until the danger of the ureter swelling shut has passed, which varies.  You may see some blood in your urine while the stent is in place and a few days afterwards. Do not be alarmed, even if the urine was clear for a while. Get off your feet and drink lots of fluids until clearing occurs. If you start to pass clots or don't improve, call us.  DIET: You may return to your normal diet immediately. Because of the raw surface of your bladder, alcohol, spicy foods, acid type foods and drinks with caffeine may cause irritation or frequency and should be used in moderation. To keep your urine flowing freely and to avoid constipation, drink plenty of fluids during the day ( 8-10 glasses ). Tip: Avoid cranberry juice because it is very acidic.  ACTIVITY: Your physical activity doesn't need to be restricted. However, if you are very active, you may see some blood in your urine. We suggest that you reduce your activity under these circumstances until the  bleeding has stopped.  BOWELS: It is important to keep your bowels regular during the postoperative period. Straining with bowel movements can cause bleeding. A bowel movement every other day is reasonable. Use a mild laxative if needed, such as Milk of Magnesia 2-3 tablespoons, or 2 Dulcolax tablets. Call if you continue to have problems. If you have been taking narcotics for pain, before, during or after your surgery, you may be constipated. Take a laxative if necessary.   MEDICATION: You should resume your pre-surgery medications unless told not to. In addition you will often be given an antibiotic to prevent infection and likely several as needed medications for stent related discomfort. These should be taken as prescribed until the bottles are finished unless you are having an unusual reaction to one of the drugs.  PROBLEMS YOU SHOULD REPORT TO [name]: Fevers over 100.5 Fahrenheit. Heavy bleeding, or clots ( See above notes about blood in urine ). Inability to urinate. Drug reactions ( hives, rash, nausea, vomiting, diarrhea ). Severe burning or pain with urination that is not improving.

## 2022-07-26 NOTE — Anesthesia Preprocedure Evaluation (Signed)
Anesthesia Evaluation  Patient identified by MRN, date of birth, ID band Patient awake    Reviewed: Allergy & Precautions, NPO status , Patient's Chart, lab work & pertinent test results  Airway Mallampati: II       Dental   Pulmonary shortness of breath, sleep apnea , pneumonia,    breath sounds clear to auscultation       Cardiovascular hypertension,  Rhythm:Regular Rate:Normal     Neuro/Psych  Headaches,  Neuromuscular disease    GI/Hepatic Neg liver ROS, GERD  ,  Endo/Other  diabetes  Renal/GU      Musculoskeletal  (+) Arthritis ,   Abdominal   Peds  Hematology   Anesthesia Other Findings   Reproductive/Obstetrics                             Anesthesia Physical Anesthesia Plan  ASA: 3  Anesthesia Plan: General   Post-op Pain Management:    Induction: Intravenous  PONV Risk Score and Plan: 3 and Ondansetron, Dexamethasone and Midazolam  Airway Management Planned: LMA  Additional Equipment:   Intra-op Plan:   Post-operative Plan: Extubation in OR  Informed Consent: I have reviewed the patients History and Physical, chart, labs and discussed the procedure including the risks, benefits and alternatives for the proposed anesthesia with the patient or authorized representative who has indicated his/her understanding and acceptance.     Dental advisory given  Plan Discussed with: CRNA and Anesthesiologist  Anesthesia Plan Comments:         Anesthesia Quick Evaluation

## 2022-07-27 ENCOUNTER — Encounter (HOSPITAL_COMMUNITY): Payer: Self-pay | Admitting: Urology

## 2022-07-27 NOTE — Telephone Encounter (Signed)
Ajovy prescription portion of application for PAP faxed to [employer]. Received a receipt of confirmation.

## 2022-07-27 NOTE — Telephone Encounter (Signed)
Spoke with [employer] and clarified the partial approval with [facility] will not hinder pt from receiving PAP assistance again as long as he meets all of the other requirements.

## 2022-07-27 NOTE — Telephone Encounter (Signed)
This drug is not on your preferred drug list (formulary). The formulary exception is approved for/through 09/25/2023 by [employer]s Non-Formulary Exceptions Pharmacy Coverage Policy. However, there is a concern that may prevent your drug from being filled, or your reimbursement from being paid. The Medicare rule in the Prescription Drug Manual (Chapter 14, Appendix E) says drugs covered under a Pharmaceutical Assistance Program (PAP) cannot be covered under a Part D plan. [employer] follows Medicare rules. The information we have says you are receiving PAP coverage for this drug and per Medicare rules isnt covered. Please contact customer service via the number on the back of your card to resolve.

## 2022-07-31 ENCOUNTER — Encounter (INDEPENDENT_AMBULATORY_CARE_PROVIDER_SITE_OTHER): Payer: Self-pay | Admitting: Family Medicine

## 2022-07-31 ENCOUNTER — Telehealth (INDEPENDENT_AMBULATORY_CARE_PROVIDER_SITE_OTHER): Payer: Self-pay | Admitting: Family Medicine

## 2022-07-31 NOTE — Telephone Encounter (Signed)
Patient was sent a Mychart message in reference to this. He called and sent message [email or web address]

## 2022-07-31 NOTE — Telephone Encounter (Signed)
Patient stated he needs a call back today. Very important. There is a deadline that is due today. 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 He stated that Fax must be responded today. Novo Norbdisk. Update the prescription to a .5 milligram and the company needs that back today.

## 2022-08-01 ENCOUNTER — Encounter (INDEPENDENT_AMBULATORY_CARE_PROVIDER_SITE_OTHER): Payer: Self-pay | Admitting: Family Medicine

## 2022-08-02 NOTE — Telephone Encounter (Signed)
Please refer to previous message, it was addressed in the previous message. Patient sent multiple Mychart messages,

## 2022-08-04 ENCOUNTER — Ambulatory Visit (INDEPENDENT_AMBULATORY_CARE_PROVIDER_SITE_OTHER): Payer: Medicare HMO | Admitting: Family Medicine

## 2022-08-04 VITALS — BP 112/82 | HR 84 | Temp 98.1°F | Wt 232.4 lb

## 2022-08-04 DIAGNOSIS — Z6834 Body mass index (BMI) 34.0-34.9, adult: Secondary | ICD-10-CM

## 2022-08-04 DIAGNOSIS — N200 Calculus of kidney: Secondary | ICD-10-CM

## 2022-08-04 DIAGNOSIS — E669 Obesity, unspecified: Secondary | ICD-10-CM | POA: Diagnosis not present

## 2022-08-04 DIAGNOSIS — R7303 Prediabetes: Secondary | ICD-10-CM

## 2022-08-04 NOTE — Progress Notes (Signed)
Subjective:    Patient ID: [name] [name], male    DOB: 26-Jul-1951, 71 y.o.   MRN: [id number]  Chief Complaint  Patient presents with   Follow-up    Med consult. 29 day story of having kidney stone, had surgery at [facility] PAP programs    HPI Patient is a 71 year old male with pmh sig for migraines, seasonal allergies, obesity, GERD, DDD, renal calculi, HTN, OSA, prediabetes who was seen today for follow-up.  Patient plans to schedule CPE in the next few weeks.  States doing better s/p episode with renal calculi last month.  Patient typically seen by healthy weight management.  Was getting Ozempic 0.5 mg wkly through PAP, however there were issues with having forms faxed to manufacture.  As such patient has been out of Ozempic times several weeks.  Patient notes a 30 pound weight gain.  Still walking several miles per day.  Notes significant improvement in migraines since being on Ajovy.  Recent hospitalization caused a migraine to occur.  Past Medical History:  Diagnosis Date   ALLERGIC RHINITIS 04/22/2007   Allergy    Cancer ([place])    HX SKIN CANCER   Complication of anesthesia    more concerned about positioning of head & neck because of TMJ   DDD (degenerative disc disease), cervical    GERD (gastroesophageal reflux disease)    infrequently   Headache(784.0)    migraines   History of kidney stones    History of skin cancer    History of TMJ disorder    HYPERCHOLESTEROLEMIA 04/22/2007   HYPERLIPIDEMIA 07/29/2007   Hyperplastic colon polyp    NEPHROLITHIASIS, HX OF 07/29/2007   Osteoarth NOS-Unspec 04/22/2007   Pneumonia    Shortness of breath    Shoulder pain, acute    bilateral    Allergies  Allergen Reactions   Shrimp [Shellfish Allergy] Anaphylaxis and Rash   Codeine Rash and Other (See Comments)    Blacked out   Contrast Media [Iodinated Contrast Media] Rash   Diazepam Rash    Rash over whole body from neck down, and he lost his voice (airway compromise?)    Amoxicillin Other (See Comments)    Unknown    Benadryl [Diphenhydramine]     With dyes    Depakote [Divalproex Sodium] Swelling    Per pts report (given by Dr. Inda Merlin).  Has throat swelling and rash.   Erythromycin Other (See Comments)    unknown   Flexeril [Cyclobenzaprine]    Allegra [Fexofenadine] Rash        Betadine [Povidone Iodine] Rash   Celebrex [Celecoxib] Rash   Meperidine And Related Rash   Methocarbamol Rash   Neosporin [Neomycin-Bacitracin Zn-Polymyx] Rash   Penicillins Rash   Plavix [Clopidogrel] Rash   Rivaroxaban Rash   Vioxx [Rofecoxib] Rash    ROS General: Denies fever, chills, night sweats, changes in appetite  + changes in weight HEENT: Denies headaches, ear pain, changes in vision, rhinorrhea, sore throat CV: Denies CP, palpitations, SOB, orthopnea Pulm: Denies SOB, cough, wheezing GI: Denies abdominal pain, nausea, vomiting, diarrhea, constipation GU: Denies dysuria, hematuria, frequency Msk: Denies muscle cramps, joint pains Neuro: Denies weakness, numbness, tingling Skin: Denies rashes, bruising Psych: Denies depression, anxiety, hallucinations     Objective:    Blood pressure 112/82, pulse 84, temperature 98.1 F (36.7 C), temperature source Oral, weight 232 lb 6.4 oz (105.4 kg), SpO2 97 %.Body mass index is 34.32 kg/m.  Gen. Pleasant, well-nourished, in no distress, normal affect  HEENT: [place]/AT, face symmetric, conjunctiva clear, no scleral icterus, PERRLA, EOMI, nares patent without drainage Lungs: no accessory muscle use Cardiovascular: RRR, no peripheral edema Musculoskeletal: No deformities, no cyanosis or clubbing, normal tone Neuro:  A&Ox3, CN II-XII intact, normal gait Skin:  Warm, no lesions/ rash   Wt Readings from Last 3 Encounters:  08/04/22 232 lb 6.4 oz (105.4 kg)  07/26/22 223 lb 15.8 oz (101.6 kg)  07/19/22 224 lb 3.2 oz (101.7 kg)    Lab Results  Component Value Date   WBC 10.0 07/06/2022   HGB 15.5 07/06/2022    HCT 45.1 07/06/2022   PLT 247 07/06/2022   GLUCOSE 108 (H) 07/06/2022   CHOL 210 (H) 12/08/2021   TRIG 133 12/08/2021   HDL 61 12/08/2021   LDLDIRECT 135.0 07/16/2013   LDLCALC 126 (H) 12/08/2021   ALT 30 12/08/2021   AST 32 12/08/2021   NA 133 (L) 07/06/2022   K 4.6 07/06/2022   CL 99 07/06/2022   CREATININE 1.35 (H) 07/06/2022   BUN 24 (H) 07/06/2022   CO2 25 07/06/2022   TSH 2.80 04/18/2021   PSA 1.97 06/09/2020   INR 0.98 06/10/2014   HGBA1C 5.4 07/19/2022    Assessment/Plan:  Class 1 obesity with serious comorbidity and body mass index (BMI) of 34.0 to 34.9 in adult, unspecified obesity type -Body mass index is 34.32 kg/m. -Current weight 232 LBS. -continue ozempic 0.5 mg subq wkly -2 forms for PAP completed -continue f/u with wt management  Prediabetes -Hemoglobin A1c  normal 5.4% on 07/14/2022 -A1c previously 5.3-5.9% since 2018. -continue Ozempic wkly  Renal calculi -Supportive care including increasing p.o. intake of water and fluids -Continue follow-up with urology  F/u in the next few weeks for CPE  Grier Mitts, MD

## 2022-08-14 ENCOUNTER — Telehealth: Payer: Self-pay

## 2022-08-14 NOTE — Telephone Encounter (Signed)
Faxed form to [employer] for patient assistance program, confirmation received on 08/04/22 Patient was approved for assistance through 09/24/22.

## 2022-08-16 ENCOUNTER — Other Ambulatory Visit: Payer: Self-pay | Admitting: Adult Health

## 2022-08-22 ENCOUNTER — Encounter: Payer: Self-pay | Admitting: Family Medicine

## 2022-08-23 ENCOUNTER — Encounter: Payer: Self-pay | Admitting: Family Medicine

## 2022-08-25 NOTE — Telephone Encounter (Signed)
I just checked all of the fridges and didn't find it. I know [employer] is experiencing some delays right now so that could be why. Also if he called and listened to the automated information rather than speaking to a person could be part of the problem. The automated service doesn't always seem to be correct.

## 2022-08-26 ENCOUNTER — Encounter: Payer: Self-pay | Admitting: Family Medicine

## 2022-08-29 ENCOUNTER — Ambulatory Visit (INDEPENDENT_AMBULATORY_CARE_PROVIDER_SITE_OTHER): Payer: Medicare HMO | Admitting: Family Medicine

## 2022-08-29 NOTE — Progress Notes (Unsigned)
   I, Peterson Lombard, LAT, ATC acting as a scribe for [name], MD.  [name] [name] is a 71 y.o. male who presents to [facility] today for right elbow pain.  Patient was previously seen by Dr. Georgina Snell on 08/09/2021 for left hip pain.  Today, patient presents with right elbow pain?  Patient locates pain to?  Grip strength: Radiates: Paresthesia: Aggravates: Treatments tried:  Dx imaging: 06/04/13 C-spine XR  Pertinent review of systems: ***  Relevant historical information: ***   Exam:  There were no vitals taken for this visit. General: Well Developed, well nourished, and in no acute distress.   MSK: ***    Lab and Radiology Results No results found for this or any previous visit (from the past 72 hour(s)). No results found.     Assessment and Plan: 71 y.o. male with ***   PDMP not reviewed this encounter. No orders of the defined types were placed in this encounter.  No orders of the defined types were placed in this encounter.    Discussed warning signs or symptoms. Please see discharge instructions. Patient expresses understanding.   ***

## 2022-08-30 ENCOUNTER — Ambulatory Visit (INDEPENDENT_AMBULATORY_CARE_PROVIDER_SITE_OTHER): Payer: Medicare HMO

## 2022-08-30 ENCOUNTER — Ambulatory Visit: Payer: Self-pay

## 2022-08-30 ENCOUNTER — Ambulatory Visit: Payer: Medicare HMO | Admitting: Family Medicine

## 2022-08-30 VITALS — BP 128/86 | HR 66 | Ht 69.0 in | Wt 238.0 lb

## 2022-08-30 DIAGNOSIS — M25521 Pain in right elbow: Secondary | ICD-10-CM | POA: Diagnosis not present

## 2022-08-30 DIAGNOSIS — M7701 Medial epicondylitis, right elbow: Secondary | ICD-10-CM | POA: Diagnosis not present

## 2022-08-30 NOTE — Patient Instructions (Addendum)
Thank you for coming in today.   Please get an Xray today before you leave   Consider theraband flexbar yellow or red.   I've referred you to Occupational Therapy.  Let us know if you don't hear from them in one week.   Recheck in 6 weeks unless better.   Please use Voltaren gel (Generic Diclofenac Gel) up to 4x daily for pain as needed.  This is available over-the-counter as both the name brand Voltaren gel and the generic diclofenac gel.

## 2022-08-31 NOTE — Progress Notes (Signed)
Right elbow x-ray shows no fractures.  Does show a little bit of arthritis and a little bit of swelling.

## 2022-09-04 DIAGNOSIS — N201 Calculus of ureter: Secondary | ICD-10-CM | POA: Diagnosis not present

## 2022-09-04 NOTE — Therapy (Incomplete)
OUTPATIENT OCCUPATIONAL THERAPY ORTHO EVALUATION  Patient Name: [name] [name] MRN: [id number] DOB:[birth year], 71 y.o., male Today's Date: 09/04/2022  PCP: Grier Mitts, MD REFERRING PROVIDER:  Gregor Hams, MD    END OF SESSION:   Past Medical History:  Diagnosis Date   ALLERGIC RHINITIS 04/22/2007   Allergy    Cancer ([place])    HX SKIN CANCER   Complication of anesthesia    more concerned about positioning of head & neck because of TMJ   DDD (degenerative disc disease), cervical    GERD (gastroesophageal reflux disease)    infrequently   Headache(784.0)    migraines   History of kidney stones    History of skin cancer    History of TMJ disorder    HYPERCHOLESTEROLEMIA 04/22/2007   HYPERLIPIDEMIA 07/29/2007   Hyperplastic colon polyp    NEPHROLITHIASIS, HX OF 07/29/2007   Osteoarth NOS-Unspec 04/22/2007   Pneumonia    Shortness of breath    Shoulder pain, acute    bilateral   Past Surgical History:  Procedure Laterality Date   APPENDECTOMY     BACK SURGERY  10/07/2015   COLONOSCOPY     10 yrs ago- 2 HPP    COLONOSCOPY  2019   CYSTOSCOPY/URETEROSCOPY/HOLMIUM LASER/STENT PLACEMENT Right 07/26/2022   Procedure: RIGHT URETEROSCOPY/HOLMIUM LASER/STENT PLACEMENT;  Surgeon: Vira Agar, MD;  Location: [facility];  Service: Urology;  Laterality: Right;  75 MINUTES NEEDED FOR CASE   JOINT REPLACEMENT     KNEE ARTHROSCOPY     right x2   SHOULDER SURGERY     right   skin cancer biopsy     L forehead   TOTAL KNEE ARTHROPLASTY Right 08/25/2013   Procedure: RIGHT TOTAL KNEE ARTHROPLASTY;  Surgeon: Gearlean Alf, MD;  Location: [facility];  Service: Orthopedics;  Laterality: Right;   TOTAL SHOULDER ARTHROPLASTY Left 12/25/2013   DR SUPPLE    TOTAL SHOULDER ARTHROPLASTY Left 12/25/2013   Procedure: LEFT TOTAL SHOULDER ARTHROPLASTY;  Surgeon: Marin Shutter, MD;  Location: [place];  Service: Orthopedics;  Laterality: Left;   TOTAL SHOULDER ARTHROPLASTY Right 06/18/2014   DR  SUPPLE   TOTAL SHOULDER ARTHROPLASTY Right 06/18/2014   Procedure: RIGHT TOTAL SHOULDER ARTHROPLASTY;  Surgeon: Marin Shutter, MD;  Location: [place];  Service: Orthopedics;  Laterality: Right;   WISDOM TOOTH EXTRACTION     Patient Active Problem List   Diagnosis Date Noted   Type 2 diabetes mellitus with other specified complication ([place]) 42/68/3419   Migraine 06/15/2022   Controlled type 2 diabetes mellitus ([place]) 05/11/2022   Hypertension 05/11/2022   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   History of replacement of both shoulder joints 05/06/2020   History of spinal fusion 05/06/2020   History of stress fracture 05/06/2020   Chronic migraine without aura, with intractable migraine, so stated, with status migrainosus 12/02/2018   OSA (obstructive sleep apnea) 12/02/2018   Tinnitus 03/20/2018   Prediabetes 11/21/2017   Other fatigue 06/18/2017   Shortness of breath on exertion 06/18/2017   Other hyperlipidemia 06/18/2017   Vitamin D deficiency 06/18/2017   Hyperglycemia 06/18/2017   Class 1 obesity with serious comorbidity and body mass index (BMI) of 34.0 to 34.9 in adult 06/18/2017   Right lumbar radiculopathy 08/24/2014   Piriformis syndrome of right side 08/24/2014   S/P shoulder replacement 12/25/2013   Migraine headache 12/22/2013   OA (osteoarthritis) of knee 08/25/2013   HYPERLIPIDEMIA 07/29/2007   NEPHROLITHIASIS, HX OF 07/29/2007  HYPERCHOLESTEROLEMIA 04/22/2007   Allergic rhinitis 04/22/2007   Osteoarthritis 04/22/2007    ONSET DATE: ***  REFERRING DIAG:  M25.521 (ICD-10-CM) - Right elbow pain  M77.01 (ICD-10-CM) - Medial epicondylitis of right elbow    THERAPY DIAG:  No diagnosis found.  Rationale for Evaluation and Treatment: Rehabilitation  SUBJECTIVE:   SUBJECTIVE STATEMENT: He states ***.    PERTINENT HISTORY: ***  PRECAUTIONS: {Therapy precautions:24002}  WEIGHT BEARING RESTRICTIONS: {Yes ***/No:24003}  PAIN:  Are you  having pain? *** in *** Rating: ***/10 at rest now, up to ***/10 at worst in past week  FALLS: Has patient fallen in last 6 months? {fallsyesno:27318}  LIVING ENVIRONMENT: Lives with: {OPRC lives with:25569::"lives with their family"} Lives in: {Lives in:25570} Stairs: {opstairs:27293} Has following equipment at home: {Assistive devices:23999}  PLOF: {PLOF:24004}  PATIENT GOALS: ***   OBJECTIVE: (All objective assessments below are from initial evaluation on: 09/08/22 unless otherwise specified.)    HAND DOMINANCE: Right ***  ADLs: Overall ADLs: States decreased ability to grab, hold household objects, pain and inability to open containers, perform FMS tasks (manipulate fasteners on clothing), mild to moderate bathing problems as well. ***   FUNCTIONAL OUTCOME MEASURES: Eval: Quck [name] ***% impairment today  (Higher % Score  =  More Impairment)    Patient Specific Functional Scale: *** (***, ***, ***)  (Higher Score  =  Better Ability for the Selected Tasks)      UPPER EXTREMITY [name]     Shoulder to Wrist AROM Right eval  Shoulder flexion   Shoulder abduction   Shoulder extension   Shoulder internal rotation   Shoulder external rotation   Elbow flexion   Elbow extension   Forearm supination   Forearm pronation    Wrist flexion   Wrist extension   Wrist ulnar deviation   Wrist radial deviation   Functional dart thrower's motion (F-DTM) in ulnar flexion   F-DTM in radial extension    (Blank rows = not tested)   Hand AROM Right eval  Full Fist Ability (or Gap to Distal Palmar Crease)   Thumb Opposition  (Kapandji Scale)    Thumb MCP (0-60)   Thumb IP (0-80)   Thumb Radial Abduction Span   Thumb Palmar Abduction Span   Index MCP (0-90)   Index PIP (0-100)   Index DIP (0-70)    Long MCP (0-90)    Long PIP (0-100)    Long DIP (0-70)    Ring MCP (0-90)    Ring PIP (0-100)    Ring DIP (0-70)    Little MCP (0-90)    Little PIP (0-100)    Little DIP  (0-70)    (Blank rows = not tested)   UPPER EXTREMITY MMT:    Eval: *** NT at eval due to recent and still healing injuries. Will be tested when appropriate.   MMT Right TBD  Shoulder flexion   Shoulder abduction   Shoulder adduction   Shoulder extension   Shoulder internal rotation   Shoulder external rotation   Middle trapezius   Lower trapezius   Elbow flexion   Elbow extension   Forearm supination   Forearm pronation   Wrist flexion   Wrist extension   Wrist ulnar deviation   Wrist radial deviation   (Blank rows = not tested)  HAND FUNCTION: Eval: Observed weakness in affected hand.  Grip strength Right: *** lbs, Left: *** lbs   COORDINATION: Eval: Observed coordination impairments with affected hand. Box and Blocks Test: ***  Blocks today (*** is [facility]); 9 Hole Peg Test Right: ***sec, Left: *** sec (*** sec is WFL)   SENSATION: Eval: *** Light touch intact today, though diminished around sx area    EDEMA:   Eval: *** Mildly swollen in hand and wrist today, ***cm circumferentially around ***  COGNITION: Eval: Overall cognitive status: WFL for evaluation today ***  OBSERVATIONS:   Eval: ***   TODAY'S TREATMENT:  Post-evaluation treatment: ***    PATIENT EDUCATION: Education details: See tx section above for details  Person educated: Patient Education method: Veterinary surgeon, Teach back, Handouts  Education comprehension: States and demonstrates understanding, Additional Education required    HOME EXERCISE PROGRAM: See tx section above for details    GOALS: Goals reviewed with patient? Yes   SHORT TERM GOALS: (STG required if POC>30 days) Target Date: ***  Pt will obtain protective, custom orthotic. Goal status: TBD/PRN,  MET ***  2.  Pt will demo/state understanding of initial HEP to improve pain levels and prerequisite motion. Goal status: INITIAL   LONG TERM GOALS: Target Date: ***  Pt will improve functional ability by decreased  impairment per Quick [name] / PSFS / PRWE assessment from *** to *** or better, for better quality of life. Goal status: INITIAL  2.  Pt will improve grip strength in *** hand from ***lbs to at least ***lbs for functional use at home and in IADLs. Goal status: INITIAL  3.  Pt will improve A/[name] in *** from *** to at least ***, to have functional motion for tasks like reach and grasp.  Goal status: INITIAL  4.  Pt will improve strength in *** from *** MMT to at least *** MMT to have increased functional ability to carry out selfcare and higher-level homecare tasks with no difficulty. Goal status: INITIAL  5.  Pt will improve coordination skills in ***, as seen by better score on *** testing to have increased functional ability to carry out fine motor tasks (fasteners, etc.) and more complex, coordinated IADLs (meal prep, sports, etc.).  Goal status: INITIAL  6.  Pt will decrease pain at worst from ***/10 to ***/10 or better to have better sleep and occupational participation in daily roles. Goal status: INITIAL    ASSESSMENT:  CLINICAL IMPRESSION: Patient is a 71 y.o. male who was seen today for occupational therapy evaluation for ***.   PERFORMANCE DEFICITS: in functional skills including {OT physical skills:25468}, cognitive skills including {OT cognitive skills:25469}, and psychosocial skills including {OT psychosocial skills:25470}.   IMPAIRMENTS: are limiting patient from {OT performance deficits:25471}.   COMORBIDITIES: {Comorbidities:25485} that affects occupational performance. Patient will benefit from skilled OT to address above impairments and improve overall function.  MODIFICATION OR ASSISTANCE TO COMPLETE EVALUATION: {OT modification:25474}  OT OCCUPATIONAL PROFILE AND HISTORY: {OT PROFILE AND HISTORY:25484}  CLINICAL DECISION MAKING: {OT CDM:25475}  REHAB POTENTIAL: {rehabpotential:25112}  EVALUATION COMPLEXITY: {Evaluation complexity:25115}      PLAN:  OT  FREQUENCY: {rehab frequency:25116}  OT DURATION: {rehab duration:25117}  PLANNED INTERVENTIONS: {OT Interventions:25467}  RECOMMENDED OTHER SERVICES: ***  CONSULTED AND AGREED WITH PLAN OF CARE: {FMB:84665}  PLAN FOR NEXT SESSION: ***   Benito Mccreedy, OTR/L, CHT 09/04/2022, 12:54 PM

## 2022-09-05 ENCOUNTER — Telehealth: Payer: Self-pay

## 2022-09-05 ENCOUNTER — Telehealth: Payer: Self-pay | Admitting: Family Medicine

## 2022-09-05 ENCOUNTER — Ambulatory Visit (INDEPENDENT_AMBULATORY_CARE_PROVIDER_SITE_OTHER): Payer: Medicare HMO

## 2022-09-05 VITALS — Ht 69.0 in | Wt 238.0 lb

## 2022-09-05 DIAGNOSIS — Z0000 Encounter for general adult medical examination without abnormal findings: Secondary | ICD-10-CM | POA: Diagnosis not present

## 2022-09-05 NOTE — Telephone Encounter (Signed)
Spoke to patient. Inform him his Ozempic from patient assistant program has arrived and ready for him to pick it up.  Patient verbalized understanding and stated he will pick it up today in 20-30 mins.  Ozempic is placed in a refrigerator at [facility].

## 2022-09-05 NOTE — Patient Instructions (Addendum)
Mr. [name] [name] , Thank you for taking time to come for your Medicare Wellness Visit. I appreciate your ongoing commitment to your health goals. Please review the following plan we discussed and let me know if I can assist you in the future.   These are the goals we discussed:  Goals       Lose weight (pt-stated)      Patient Stated      I will continue to walk 5 miles per day.      Weight (lb) < 169 lb (76.7 kg)      Will continue your weight loss efforts with [name]  Continue your exercise program.         This is a list of the screening recommended for you and due dates:  Health Maintenance  Topic Date Due   Complete foot exam   Never done   Eye exam for diabetics  Never done   DTaP/Tdap/Td vaccine (2 - Td or Tdap) 03/26/2021   Yearly kidney health urinalysis for diabetes  09/06/2022*   COVID-19 Vaccine (5 - 2023-24 season) 09/21/2022*   Hepatitis C Screening: USPSTF Recommendation to screen - Ages 18-79 yo.  09/06/2023*   Hemoglobin A1C  01/18/2023   Yearly kidney function blood test for diabetes  07/07/2023   Medicare Annual Wellness Visit  09/06/2023   Colon Cancer Screening  11/03/2027   Pneumonia Vaccine  Completed   Flu Shot  Completed   Zoster (Shingles) Vaccine  Completed   HPV Vaccine  Aged Out  *Topic was postponed. The date shown is not the original due date.   Opioid Pain Medicine Management Opioids are powerful medicines that are used to treat moderate to severe pain. When used for short periods of time, they can help you to: Sleep better. Do better in physical or occupational therapy. Feel better in the first few days after an injury. Recover from surgery. Opioids should be taken with the supervision of a trained health care provider. They should be taken for the shortest period of time possible. This is because opioids can be addictive, and the longer you take opioids, the greater your risk of addiction. This addiction can also be called opioid use  disorder. What are the risks? Using opioid pain medicines for longer than 3 days increases your risk of side effects. Side effects include: Constipation. Nausea and vomiting. Breathing difficulties (respiratory depression). Drowsiness. Confusion. Opioid use disorder. Itching. Taking opioid pain medicine for a long period of time can affect your ability to do daily tasks. It also puts you at risk for: Motor vehicle crashes. Depression. Suicide. Heart attack. Overdose, which can be life-threatening. What is a pain treatment plan? A pain treatment plan is an agreement between you and your health care provider. Pain is unique to each person, and treatments vary depending on your condition. To manage your pain, you and your health care provider need to work together. To help you do this: Discuss the goals of your treatment, including how much pain you might expect to have and how you will manage the pain. Review the risks and benefits of taking opioid medicines. Remember that a good treatment plan uses more than one approach and minimizes the chance of side effects. Be honest about the amount of medicines you take and about any drug or alcohol use. Get pain medicine prescriptions from only one health care provider. Pain can be managed with many types of alternative treatments. Ask your health care provider to refer you to  one or more specialists who can help you manage pain through: Physical or occupational therapy. Counseling (cognitive behavioral therapy). Good nutrition. Biofeedback. Massage. Meditation. Non-opioid medicine. Following a gentle exercise program. How to use opioid pain medicine Taking medicine Take your pain medicine exactly as told by your health care provider. Take it only when you need it. If your pain gets less severe, you may take less than your prescribed dose if your health care provider approves. If you are not having pain, do nottake pain medicine unless your  health care provider tells you to take it. If your pain is severe, do nottry to treat it yourself by taking more pills than instructed on your prescription. Contact your health care provider for help. Write down the times when you take your pain medicine. It is easy to become confused while on pain medicine. Writing the time can help you avoid overdose. Take other over-the-counter or prescription medicines only as told by your health care provider. Keeping yourself and others safe  While you are taking opioid pain medicine: Do not drive, use machinery, or power tools. Do not sign legal documents. Do not drink alcohol. Do not take sleeping pills. Do not supervise children by yourself. Do not do activities that require climbing or being in high places. Do not go to a lake, river, ocean, spa, or swimming pool. Do not share your pain medicine with anyone. Keep pain medicine in a locked cabinet or in a secure area where pets and children cannot reach it. Stopping your use of opioids If you have been taking opioid medicine for more than a few weeks, you may need to slowly decrease (taper) how much you take until you stop completely. Tapering your use of opioids can decrease your risk of symptoms of withdrawal, such as: Pain and cramping in the abdomen. Nausea. Sweating. Sleepiness. Restlessness. Uncontrollable shaking (tremors). Cravings for the medicine. Do not attempt to taper your use of opioids on your own. Talk with your health care provider about how to do this. Your health care provider may prescribe a step-down schedule based on how much medicine you are taking and how long you have been taking it. Getting rid of leftover pills Do not save any leftover pills. Get rid of leftover pills safely by: Taking the medicine to a prescription take-back program. This is usually offered by the county or law enforcement. Bringing them to a pharmacy that has a drug disposal container. Flushing them  down the toilet. Check the label or package insert of your medicine to see whether this is safe to do. Throwing them out in the trash. Check the label or package insert of your medicine to see whether this is safe to do. If it is safe to throw it out, remove the medicine from the original container, put it into a sealable bag or container, and mix it with used coffee grounds, food scraps, dirt, or cat litter before putting it in the trash. Follow these instructions at home: Activity Do exercises as told by your health care provider. Avoid activities that make your pain worse. Return to your normal activities as told by your health care provider. Ask your health care provider what activities are safe for you. General instructions You may need to take these actions to prevent or treat constipation: Drink enough fluid to keep your urine pale yellow. Take over-the-counter or prescription medicines. Eat foods that are high in fiber, such as beans, whole grains, and fresh fruits and vegetables. Limit foods  that are high in fat and processed sugars, such as fried or sweet foods. Keep all follow-up visits. This is important. Where to find support If you have been taking opioids for a long time, you may benefit from receiving support for quitting from a local support group or counselor. Ask your health care provider for a referral to these resources in your area. Where to find more information Centers for Disease Control and Prevention (CDC): http://www.wolf.info/ [place] Food and Drug Administration (FDA): [place] Get help right away if: You may have taken too much of an opioid (overdosed). Common symptoms of an overdose: Your breathing is slower or more shallow than normal. You have a very slow heartbeat (pulse). You have slurred speech. You have nausea and vomiting. Your pupils become very small. You have other potential symptoms: You are very confused. You faint or feel like you will faint. You have  cold, clammy skin. You have blue lips or fingernails. You have thoughts of harming yourself or harming others. These symptoms may represent a serious problem that is an emergency. Do not wait to see if the symptoms will go away. Get medical help right away. Call your local emergency services (911 in the [place] Do not drive yourself to the hospital.  If you ever feel like you may hurt yourself or others, or have thoughts about taking your own life, get help right away. Go to your nearest emergency department or: Call your local emergency services (911 in the [place] Call the [facility] (408) 400-7794 in the [place] Call a suicide crisis helpline, such as the [place] at 7126442061 or 988 in the [place]. This is open 24 hours a day in the [place] Text the Crisis Text Line at 5751020831 (in the [place].). Summary Opioid medicines can help you manage moderate to severe pain for a short period of time. A pain treatment plan is an agreement between you and your health care provider. Discuss the goals of your treatment, including how much pain you might expect to have and how you will manage the pain. If you think that you or someone else may have taken too much of an opioid, get medical help right away. This information is not intended to replace advice given to you by your health care provider. Make sure you discuss any questions you have with your health care provider. Document Revised: 04/06/2021 Document Reviewed: 12/22/2020 Elsevier Patient Education  [place] directives: Please bring a copy of your health care power of attorney and living will to the office to be added to your chart at your convenience.   Conditions/risks identified: None  Next appointment: Follow up in one year for your annual wellness visit.    Preventive Care 52 Years and Older, Male  Preventive care refers to lifestyle choices and visits with your health care  provider that can promote health and wellness. What does preventive care include? A yearly physical exam. This is also called an annual well check. Dental exams once or twice a year. Routine eye exams. Ask your health care provider how often you should have your eyes checked. Personal lifestyle choices, including: Daily care of your teeth and gums. Regular physical activity. Eating a healthy diet. Avoiding tobacco and drug use. Limiting alcohol use. Practicing safe sex. Taking low doses of aspirin every day. Taking vitamin and mineral supplements as recommended by your health care provider. What happens during an annual well check? The services and screenings done by your  health care provider during your annual well check will depend on your age, overall health, lifestyle risk factors, and family history of disease. Counseling  Your health care provider may ask you questions about your: Alcohol use. Tobacco use. Drug use. Emotional well-being. Home and relationship well-being. Sexual activity. Eating habits. History of falls. Memory and ability to understand (cognition). Work and work Statistician. Screening  You may have the following tests or measurements: Height, weight, and BMI. Blood pressure. Lipid and cholesterol levels. These may be checked every 5 years, or more frequently if you are over 19 years old. Skin check. Lung cancer screening. You may have this screening every year starting at age 39 if you have a 30-pack-year history of smoking and currently smoke or have quit within the past 15 years. Fecal occult blood test (FOBT) of the stool. You may have this test every year starting at age 43. Flexible sigmoidoscopy or colonoscopy. You may have a sigmoidoscopy every 5 years or a colonoscopy every 10 years starting at age 16. Prostate cancer screening. Recommendations will vary depending on your family history and other risks. Hepatitis C blood test. Hepatitis B blood  test. Sexually transmitted disease (STD) testing. Diabetes screening. This is done by checking your blood sugar (glucose) after you have not eaten for a while (fasting). You may have this done every 1-3 years. Abdominal aortic aneurysm (AAA) screening. You may need this if you are a current or former smoker. Osteoporosis. You may be screened starting at age 18 if you are at high risk. Talk with your health care provider about your test results, treatment options, and if necessary, the need for more tests. Vaccines  Your health care provider may recommend certain vaccines, such as: Influenza vaccine. This is recommended every year. Tetanus, diphtheria, and acellular pertussis (Tdap, Td) vaccine. You may need a Td booster every 10 years. Zoster vaccine. You may need this after age 99. Pneumococcal 13-valent conjugate (PCV13) vaccine. One dose is recommended after age 72. Pneumococcal polysaccharide (PPSV23) vaccine. One dose is recommended after age 34. Talk to your health care provider about which screenings and vaccines you need and how often you need them. This information is not intended to replace advice given to you by your health care provider. Make sure you discuss any questions you have with your health care provider. Document Released: 10/08/2015 Document Revised: 05/31/2016 Document Reviewed: 07/13/2015 Elsevier Interactive Patient Education  2017 [place] Prevention in the Home Falls can cause injuries. They can happen to people of all ages. There are many things you can do to make your home safe and to help prevent falls. What can I do on the outside of my home? Regularly fix the edges of walkways and driveways and fix any cracks. Remove anything that might make you trip as you walk through a door, such as a raised step or threshold. Trim any bushes or trees on the path to your home. Use bright outdoor lighting. Clear any walking paths of anything that might make  someone trip, such as rocks or tools. Regularly check to see if handrails are loose or broken. Make sure that both sides of any steps have handrails. Any raised decks and porches should have guardrails on the edges. Have any leaves, snow, or ice cleared regularly. Use sand or salt on walking paths during winter. Clean up any spills in your garage right away. This includes oil or grease spills. What can I do in the bathroom? Use night lights.  Install grab bars by the toilet and in the tub and shower. Do not use towel bars as grab bars. Use non-skid mats or decals in the tub or shower. If you need to sit down in the shower, use a plastic, non-slip stool. Keep the floor dry. Clean up any water that spills on the floor as soon as it happens. Remove soap buildup in the tub or shower regularly. Attach bath mats securely with double-sided non-slip rug tape. Do not have throw rugs and other things on the floor that can make you trip. What can I do in the bedroom? Use night lights. Make sure that you have a light by your bed that is easy to reach. Do not use any sheets or blankets that are too big for your bed. They should not hang down onto the floor. Have a firm chair that has side arms. You can use this for support while you get dressed. Do not have throw rugs and other things on the floor that can make you trip. What can I do in the kitchen? Clean up any spills right away. Avoid walking on wet floors. Keep items that you use a lot in easy-to-reach places. If you need to reach something above you, use a strong step stool that has a grab bar. Keep electrical cords out of the way. Do not use floor polish or wax that makes floors slippery. If you must use wax, use non-skid floor wax. Do not have throw rugs and other things on the floor that can make you trip. What can I do with my stairs? Do not leave any items on the stairs. Make sure that there are handrails on both sides of the stairs and  use them. Fix handrails that are broken or loose. Make sure that handrails are as long as the stairways. Check any carpeting to make sure that it is firmly attached to the stairs. Fix any carpet that is loose or worn. Avoid having throw rugs at the top or bottom of the stairs. If you do have throw rugs, attach them to the floor with carpet tape. Make sure that you have a light switch at the top of the stairs and the bottom of the stairs. If you do not have them, ask someone to add them for you. What else can I do to help prevent falls? Wear shoes that: Do not have high heels. Have rubber bottoms. Are comfortable and fit you well. Are closed at the toe. Do not wear sandals. If you use a stepladder: Make sure that it is fully opened. Do not climb a closed stepladder. Make sure that both sides of the stepladder are locked into place. Ask someone to hold it for you, if possible. Clearly mark and make sure that you can see: Any grab bars or handrails. First and last steps. Where the edge of each step is. Use tools that help you move around (mobility aids) if they are needed. These include: Canes. Walkers. Scooters. Crutches. Turn on the lights when you go into a dark area. Replace any light bulbs as soon as they burn out. Set up your furniture so you have a clear path. Avoid moving your furniture around. If any of your floors are uneven, fix them. If there are any pets around you, be aware of where they are. Review your medicines with your doctor. Some medicines can make you feel dizzy. This can increase your chance of falling. Ask your doctor what other things that you can  do to help prevent falls. This information is not intended to replace advice given to you by your health care provider. Make sure you discuss any questions you have with your health care provider. Document Released: 07/08/2009 Document Revised: 02/17/2016 Document Reviewed: 10/16/2014 Elsevier Interactive Patient  Education  2017 [employer].

## 2022-09-05 NOTE — Progress Notes (Addendum)
Subjective:   [name] [name] is a 71 y.o. male who presents for Medicare Annual/Subsequent preventive examination.  Review of Systems    Virtual Visit via Telephone Note  I connected with  [name] [name] on 09/05/22 at  9:00 AM EST by telephone and verified that I am speaking with the correct person using two identifiers.  Location: Patient: Home Provider: Office Persons participating in the virtual visit: patient/Nurse Health Advisor   I discussed the limitations, risks, security and privacy concerns of performing an evaluation and management service by telephone and the availability of in person appointments. The patient expressed understanding and agreed to proceed.  Interactive audio and video telecommunications were attempted between this nurse and patient, however failed, due to patient having technical difficulties OR patient did not have access to video capability.  We continued and completed visit with audio only.  Some vital signs may be absent or patient reported.   [name], LPN  Cardiac Risk Factors include: advanced age (>33mn, >>60women);male gender;hypertension     Objective:    Today's Vitals   09/05/22 0906  Weight: 238 lb (108 kg)  Height: _0  (1.753 m)   Body mass index is 35.15 kg/m.     09/05/2022    9:14 AM 07/26/2022    8:49 AM 07/19/2022   10:19 AM 07/06/2022    3:39 PM 08/16/2021    8:28 AM 07/06/2021    8:52 AM 04/08/2021    8:09 PM  Advanced Directives  Does Patient Have a Medical Advance Directive? Yes Yes Yes No Yes Yes Yes  Type of [place]  [place]  Living will Living will   Does patient want to make changes to medical advance directive?     No - Patient declined    Copy of [place] Chart? No - copy requested  No - copy requested      Would patient like information on creating a medical advance directive?  No - Patient  declined  No - Patient declined       Current Medications (verified) Outpatient Encounter Medications as of 09/05/2022  Medication Sig   acetaminophen (TYLENOL) 500 MG tablet Take 2 tablets (1,000 mg total) by mouth every 6 (six) hours as needed for mild pain or moderate pain.   allopurinol (ZYLOPRIM) 100 MG tablet TAKE 1 TABLET EVERY DAY   amitriptyline (ELAVIL) 100 MG tablet TAKE 1 TABLET AT BEDTIME   DULoxetine (CYMBALTA) 60 MG capsule TAKE 1 CAPSULE EVERY DAY   EPINEPHRINE 0.3 mg/0.3 mL IJ SOAJ injection INJECT 0.3 MLS INTO THE MUSCLE ONCE   Fremanezumab-vfrm (AJOVY) 225 MG/1.5ML SOAJ Inject 225 mg into the skin every 30 (thirty) days.   gabapentin (NEURONTIN) 300 MG capsule Take 1 capsule (300 mg total) by mouth at bedtime. (Patient taking differently: Take 300 mg by mouth 2 (two) times daily as needed (pain).)   naproxen (NAPROSYN) 500 MG tablet Take 1 tablet (500 mg total) by mouth 3 (three) times daily with meals.   NON FORMULARY Pt uses a cpap nightly   ondansetron (ZOFRAN-ODT) 4 MG disintegrating tablet 46mODT q4 hours prn nausea/vomit   oxyCODONE (ROXICODONE) 5 MG immediate release tablet Take 1 tablet (5 mg total) by mouth every 6 (six) hours as needed for severe pain.   pravastatin (PRAVACHOL) 40 MG tablet TAKE 1 TABLET (40 MG TOTAL) BY MOUTH DAILY.   Psyllium (METAMUCIL PO) Take 2  tablets by mouth 2 (two) times daily.   Semaglutide,0.25 or 0.5MG/DOS, (OZEMPIC, 0.25 OR 0.5 MG/DOSE,) 2 MG/1.5ML SOPN Inject 0.5 mg into the skin once a week.   SUMAtriptan (IMITREX) 100 MG tablet TAKE 1 TABLET AS NEEDED FOR MIGRAINE. MAY REPEAT IN 2 HOURS IF HEADACHE PERSISTS OR RECURS. MAX 2 TABLETS IN 24 HOURS   tamsulosin (FLOMAX) 0.4 MG CAPS capsule Take 1 capsule (0.4 mg total) by mouth daily.   tiZANidine (ZANAFLEX) 4 MG tablet TAKE 1 TABLET THREE TIMES DAILY AS NEEDED FOR MUSCLE SPASM(S)   valACYclovir (VALTREX) 500 MG tablet Take 500 mg by mouth daily as needed (outbreaks).    Facility-Administered Encounter Medications as of 09/05/2022  Medication   0.9 %  sodium chloride infusion    Allergies (verified) Shrimp [shellfish allergy], Codeine, Contrast media [iodinated contrast media], Diazepam, Amoxicillin, Benadryl [diphenhydramine], Depakote [divalproex sodium], Erythromycin, Flexeril [cyclobenzaprine], Allegra [fexofenadine], Betadine [povidone iodine], Celebrex [celecoxib], Meperidine and related, Methocarbamol, Neosporin [neomycin-bacitracin zn-polymyx], Penicillins, Plavix [clopidogrel], Rivaroxaban, and Vioxx [rofecoxib]   History: Past Medical History:  Diagnosis Date   ALLERGIC RHINITIS 04/22/2007   Allergy    Cancer ([place])    HX SKIN CANCER   Complication of anesthesia    more concerned about positioning of head & neck because of TMJ   DDD (degenerative disc disease), cervical    GERD (gastroesophageal reflux disease)    infrequently   Headache(784.0)    migraines   History of kidney stones    History of skin cancer    History of TMJ disorder    HYPERCHOLESTEROLEMIA 04/22/2007   HYPERLIPIDEMIA 07/29/2007   Hyperplastic colon polyp    NEPHROLITHIASIS, HX OF 07/29/2007   Osteoarth NOS-Unspec 04/22/2007   Pneumonia    Shortness of breath    Shoulder pain, acute    bilateral   Past Surgical History:  Procedure Laterality Date   APPENDECTOMY     BACK SURGERY  10/07/2015   COLONOSCOPY     10 yrs ago- 2 HPP    COLONOSCOPY  2019   CYSTOSCOPY/URETEROSCOPY/HOLMIUM LASER/STENT PLACEMENT Right 07/26/2022   Procedure: RIGHT URETEROSCOPY/HOLMIUM LASER/STENT PLACEMENT;  Surgeon: Vira Agar, MD;  Location: [facility];  Service: Urology;  Laterality: Right;  75 MINUTES NEEDED FOR CASE   JOINT REPLACEMENT     KNEE ARTHROSCOPY     right x2   SHOULDER SURGERY     right   skin cancer biopsy     L forehead   TOTAL KNEE ARTHROPLASTY Right 08/25/2013   Procedure: RIGHT TOTAL KNEE ARTHROPLASTY;  Surgeon: Gearlean Alf, MD;  Location: [facility];   Service: Orthopedics;  Laterality: Right;   TOTAL SHOULDER ARTHROPLASTY Left 12/25/2013   DR SUPPLE    TOTAL SHOULDER ARTHROPLASTY Left 12/25/2013   Procedure: LEFT TOTAL SHOULDER ARTHROPLASTY;  Surgeon: Marin Shutter, MD;  Location: [place];  Service: Orthopedics;  Laterality: Left;   TOTAL SHOULDER ARTHROPLASTY Right 06/18/2014   DR SUPPLE   TOTAL SHOULDER ARTHROPLASTY Right 06/18/2014   Procedure: RIGHT TOTAL SHOULDER ARTHROPLASTY;  Surgeon: Marin Shutter, MD;  Location: [place];  Service: Orthopedics;  Laterality: Right;   WISDOM TOOTH EXTRACTION     Family History  Problem Relation Age of Onset   COPD Father        family hx   Emphysema Father    Aneurysm Mother    Stroke Mother    Sudden death Mother    Cancer Mother    Obesity Mother    Breast cancer  Mother    Cancer Maternal Grandfather        lung   Colon cancer Neg Hx    Colon polyps Neg Hx    Social History   Socioeconomic History   Marital status: Married    Spouse name: Not on file   Number of children: 1   Years of education: 16+   Highest education level: Bachelor's degree (e.g., BA, AB, BS)  Occupational History   Occupation: Retired  Tobacco Use   Smoking status: Never   Smokeless tobacco: Never  Vaping Use   Vaping Use: Never used  Substance and Sexual Activity   Alcohol use: No    Alcohol/week: 0.0 standard drinks of alcohol   Drug use: No   Sexual activity: Yes    Partners: Female    Birth control/protection: None  Other Topics Concern   Not on file  Social History Narrative   Lives at home w/ his wife   Right-handed   Caffeine: 2 cups of coffee each morning   Social Determinants of Health   Financial Resource Strain: Low Risk  (09/05/2022)   Overall Financial Resource Strain (CARDIA)    Difficulty of Paying Living Expenses: Not hard at all  Food Insecurity: No Food Insecurity (09/05/2022)   Hunger Vital Sign    Worried About Running Out of Food in the Last Year: Never true    Ran Out of  Food in the Last Year: Never true  Transportation Needs: No Transportation Needs (09/05/2022)   PRAPARE - Hydrologist (Medical): No    Lack of Transportation (Non-Medical): No  Physical Activity: Sufficiently Active (09/05/2022)   Exercise Vital Sign    Days of Exercise per Week: 7 days    Minutes of Exercise per Session: 90 min  Stress: No Stress Concern Present (09/05/2022)   [place]    Feeling of Stress : Not at all  Social Connections: [place] (09/05/2022)   Social Connection and Isolation Panel [NHANES]    Frequency of Communication with Friends and Family: More than three times a week    Frequency of Social Gatherings with Friends and Family: More than three times a week    Attends Religious Services: More than 4 times per year    Active Member of [employer] or Organizations: Yes    Attends Music therapist: More than 4 times per year    Marital Status: Married    Tobacco Counseling Counseling given: Not Answered   Clinical Intake:  Pre-visit preparation completed: Yes  Pain : No/denies pain     BMI - recorded: 35.15 Nutritional Status: BMI > 30  Obese Nutritional Risks: None Diabetes: No  How often do you need to have someone help you when you read instructions, pamphlets, or other written materials from your doctor or pharmacy?: 1 - Never  Diabetic?  No  Interpreter Needed?: No  Information entered by :: Rolene Arbour LPN   Activities of Daily Living    09/05/2022    9:13 AM 09/01/2022   10:15 AM  In your present state of health, do you have any difficulty performing the following activities:  Hearing? 0 0  Vision? 0 0  Difficulty concentrating or making decisions? 0 0  Walking or climbing stairs? 0 0  Dressing or bathing? 0 0  Doing errands, shopping? 0 0  Preparing Food and eating ? N   Using the Toilet? N N  In  the past six  months, have you accidently leaked urine? N N  Do you have problems with loss of bowel control? N N  Managing your Medications? N N  Managing your Finances? N N  Housekeeping or managing your Housekeeping? N N    Patient Care Team: [name], MD as PCP - General (Family Medicine) Melvenia Beam, MD as Consulting Physician (Neurology) Starlyn Skeans, MD as Consulting Physician (Family Medicine)  Indicate any recent Medical Services you may have received from other than [facility] providers in the past year (date may be approximate).     Assessment:   This is a routine wellness examination for Williams.  Hearing/Vision screen Hearing Screening - Comments:: Denies hearing difficulties   Vision Screening - Comments:: Wears rx glasses - up to date with routine eye exams with  [place] issues and exercise activities discussed: Current Exercise Habits: Home exercise routine, Type of exercise: walking, Time (Minutes): > 60, Frequency (Times/Week): 7, Weekly Exercise (Minutes/Week): 0, Intensity: Moderate, Exercise limited by: None identified   Goals Addressed               This Visit's Progress     Lose weight (pt-stated)         Depression Screen    09/05/2022    9:12 AM 08/04/2022   10:37 AM 10/10/2021    3:49 PM 08/16/2021    8:18 AM 05/24/2020    8:34 AM 03/24/2020    9:42 AM 07/12/2017    1:43 PM  PHQ 2/9 Scores  PHQ - 2 Score 0 0 0 0 0 0 0  PHQ- 9 Score   6  0      Fall Risk    09/05/2022    9:14 AM 09/01/2022   10:15 AM 10/10/2021    3:48 PM 08/16/2021    8:25 AM 08/15/2021    7:59 AM  Fall Risk   Falls in the past year? 0 0 0 0 0  Number falls in past yr: 0   0   Injury with Fall? 0   0   Risk for fall due to : No Fall Risks      Follow up Falls prevention discussed        [place]:  Any stairs in or around the home? Yes  If so, are there any without handrails? No  Home free of loose throw rugs in  walkways, pet beds, electrical cords, etc? Yes  Adequate lighting in your home to reduce risk of falls? Yes   ASSISTIVE DEVICES UTILIZED TO PREVENT FALLS:  Life alert? No  Use of a cane, walker or w/c? No  Grab bars in the bathroom? No  Shower chair or bench in shower? Yes  Elevated toilet seat or a handicapped toilet? No   TIMED UP AND GO:  Was the test performed? No . Audio Visit  Cognitive Function:        09/05/2022    9:14 AM 08/16/2021    8:27 AM  6CIT Screen  What Year? 0 points   What month? 0 points 0 points  What time? 0 points 0 points  Count back from 20 0 points 0 points  Months in reverse 0 points 0 points  Repeat phrase 0 points 0 points  Total Score 0 points     Immunizations Immunization History  Administered Date(s) Administered   Fluad Quad(high Dose 65+) 06/19/2019, 06/15/2020, 06/16/2021   Influenza  Split 07/10/2012   Influenza Whole 09/25/2005, 07/22/2009   Influenza, High Dose Seasonal PF 06/30/2016, 06/12/2017, 06/20/2018   Influenza,inj,Quad PF,6+ Mos 06/10/2013, 06/10/2014, 06/16/2015   Influenza-Unspecified 07/10/2012, 06/10/2013, 06/10/2014, 06/16/2015, 06/14/2022   Moderna Sars-Covid-2 Vaccination 10/25/2019, 11/22/2019, 07/19/2020, 02/12/2021   Pneumococcal Conjugate-13 06/02/2016   Pneumococcal Polysaccharide-23 07/12/2017   Respiratory Syncytial Virus Vaccine,Recomb Aduvanted(Arexvy) 06/29/2022   Tdap 03/27/2011   Zoster Recombinat (Shingrix) 01/24/2022, 05/02/2022   Zoster, Live 06/02/2016    TDAP status: Due, Education has been provided regarding the importance of this vaccine. Advised may receive this vaccine at local pharmacy or Health Dept. Aware to provide a copy of the vaccination record if obtained from local pharmacy or Health Dept. Verbalized acceptance and understanding.  Flu Vaccine status: Up to date  Pneumococcal vaccine status: Up to date  Covid-19 vaccine status: Completed vaccines  Qualifies for Shingles  Vaccine? Yes   Zostavax completed Yes   Shingrix Completed?: Yes  Screening Tests Health Maintenance  Topic Date Due   FOOT EXAM  Never done   OPHTHALMOLOGY EXAM  Never done   DTaP/Tdap/Td (2 - Td or Tdap) 03/26/2021   Diabetic kidney evaluation - Urine ACR  09/06/2022 (Originally 03/15/1969)   COVID-19 Vaccine (5 - 2023-24 season) 09/21/2022 (Originally 05/26/2022)   Hepatitis C Screening  09/06/2023 (Originally 03/15/1969)   HEMOGLOBIN A1C  01/18/2023   Diabetic kidney evaluation - eGFR measurement  07/07/2023   Medicare Annual Wellness (AWV)  09/06/2023   COLONOSCOPY (Pts 45-1yr Insurance coverage will need to be confirmed)  11/03/2027   Pneumonia Vaccine 71 Years old  Completed   INFLUENZA VACCINE  Completed   Zoster Vaccines- Shingrix  Completed   HPV VACCINES  Aged Out    Health Maintenance  Health Maintenance Due  Topic Date Due   FOOT EXAM  Never done   OPHTHALMOLOGY EXAM  Never done   DTaP/Tdap/Td (2 - Td or Tdap) 03/26/2021    Colorectal cancer screening: Type of screening: Colonoscopy. Completed 11/02/17. Repeat every 10 years  Lung Cancer Screening: (Low Dose CT Chest recommended if Age 71-80years, 30 pack-year currently smoking OR have quit w/in 15years.) does not qualify.     Additional Screening:  Hepatitis C Screening: does qualify; Deferred  Vision Screening: Recommended annual ophthalmology exams for early detection of glaucoma and other disorders of the eye. Is the patient up to date with their annual eye exam?  Yes  Who is the provider or what is the name of the office in which the patient attends annual eye exams? [facility]If pt is not established with a provider, would they like to be referred to a provider to establish care? No .   Dental Screening: Recommended annual dental exams for proper oral hygiene  Community Resource Referral / Chronic Care Management:  CRR required this visit?  No   CCM required this visit?  No      Plan:      I have personally reviewed and noted the following in the patient's chart:   Medical and social history Use of alcohol, tobacco or illicit drugs  Current medications and supplements including opioid prescriptions. Patient is currently taking opioid prescriptions. Information provided to patient regarding non-opioid alternatives. Patient advised to discuss non-opioid treatment plan with their provider. Functional ability and status Nutritional status Physical activity Advanced directives List of other physicians Hospitalizations, surgeries, and ER visits in previous 12 months Vitals Screenings to include cognitive, depression, and falls Referrals and appointments  In addition, I  have reviewed and discussed with patient certain preventive protocols, quality metrics, and best practice recommendations. A written personalized care plan for preventive services as well as general preventive health recommendations were provided to patient.     [name], LPN   29/10/1113   Nurse Notes: Patient due Hep C Screening, Dtap/Tdap/Td(2-Td or Tdap) and Diabetic kidney evaluation-Urine ACR

## 2022-09-06 ENCOUNTER — Encounter: Payer: Medicare HMO | Admitting: Family Medicine

## 2022-09-08 ENCOUNTER — Encounter: Payer: Medicare HMO | Admitting: Rehabilitative and Restorative Service Providers"

## 2022-09-22 ENCOUNTER — Encounter: Payer: Medicare HMO | Admitting: Rehabilitative and Restorative Service Providers"

## 2022-09-27 ENCOUNTER — Telehealth: Payer: Self-pay

## 2022-09-27 NOTE — Telephone Encounter (Signed)
Reach out to patient regarding to [employer] Patient Assistance Program.   Patient states he had receive the same letter stating they are missing information on income.   Patient continues that he will follow up with them.

## 2022-09-29 ENCOUNTER — Encounter: Payer: Self-pay | Admitting: Family Medicine

## 2022-09-29 ENCOUNTER — Ambulatory Visit (INDEPENDENT_AMBULATORY_CARE_PROVIDER_SITE_OTHER): Payer: Medicare HMO | Admitting: Family Medicine

## 2022-09-29 VITALS — BP 106/80 | HR 65 | Temp 97.8°F | Wt 242.2 lb

## 2022-09-29 DIAGNOSIS — J019 Acute sinusitis, unspecified: Secondary | ICD-10-CM

## 2022-09-29 DIAGNOSIS — R0981 Nasal congestion: Secondary | ICD-10-CM

## 2022-09-29 DIAGNOSIS — L989 Disorder of the skin and subcutaneous tissue, unspecified: Secondary | ICD-10-CM

## 2022-09-29 DIAGNOSIS — G4733 Obstructive sleep apnea (adult) (pediatric): Secondary | ICD-10-CM

## 2022-09-29 MED ORDER — DOXYCYCLINE HYCLATE 100 MG PO TABS: 100.0000 mg | ORAL_TABLET | Freq: Two times a day (BID) | ORAL | 0 refills | Status: AC

## 2022-09-29 MED ORDER — FLUTICASONE PROPIONATE 50 MCG/ACT NA SUSP: 1.0000 | Freq: Every day | NASAL | 1 refills | Status: DC

## 2022-09-29 NOTE — Patient Instructions (Addendum)
A prescription for Flonase and doxycycline were sent to your pharmacy.  Flonase is also available over-the-counter if insurance will not cover the cost.  The generic form of Flonase is called fluticasone.  You can also use saline nasal rinse which is a sterile salt water solution that you can spray in your nose to rinse out any pollen and help promote drainage of thick mucus.  Though you are seen by dermatology I placed a referral to your dermatologist, Dr. Delman Cheadle for the lesion on your left forearm.  Hopefully this helps get you more quickly.  The area could be a cherry angioma, which is like a collection of blood vessels but I still recommend having it removed to prevent wearing about it later.  I have included some information about angiomas and abnormal skin lesions for you to review.

## 2022-09-29 NOTE — Progress Notes (Signed)
Acute Office Visit  Subjective:     Patient ID: [name] [name], male    DOB: September 28, 1950, 72 y.o.   MRN: [id number]  Chief Complaint  Patient presents with   OTHER    Pt reports of deep chest congestion with heavy mucous- dark yellow to green to brown. Sx started 10 days ago. Taking allegra    HPI Patient is in today for acute concern.  Patient endorses 10 days of nasal congestion and fatigue.  Symptoms started the week after Christmas and have continued.  Patient started develop a mild cough.  3 symptoms will progress into pneumonia.  Denies sore throat, headache, facial pain/pressure, ear pain/pressure.  Patient has been unable to wear CPAP for the last 2 nights 2/2 symptoms which caused a migraine.  Patient is only tried Human resources officer for symptoms 2/2 history of allergies.  Patient with a raised red area on left forearm times a few months.  Area is nonpruritic.  It started out as flat and is now raised.  Patient followed by dermatology, Dr. Delman Cheadle.?  Previously on Ozempic through assistance program however was recently denied.  Patient planning to resubmit financial information.  ROS + Nasal congestion, fatigue, cough, skin lesion on left forearm     Objective:    BP 106/80 (BP Location: Right Arm, Patient Position: Sitting, Cuff Size: Large)   Pulse 65   Temp 97.8 F (36.6 C) (Oral)   Wt 242 lb 3.2 oz (109.9 kg)   SpO2 98%   BMI 35.77 kg/m    Physical Exam Constitutional:      General: He is not in acute distress.    Appearance: Normal appearance. He is not ill-appearing or toxic-appearing.  HENT:     Head: Normocephalic and atraumatic.     Right Ear: Tympanic membrane normal.     Left Ear: Tympanic membrane normal.     Nose: Nose normal.     Mouth/Throat:     Mouth: Mucous membranes are moist.     Pharynx: No oropharyngeal exudate or posterior oropharyngeal erythema.  Eyes:     Extraocular Movements: Extraocular movements intact.     Conjunctiva/sclera: Conjunctivae  normal.     Pupils: Pupils are equal, round, and reactive to light.  Cardiovascular:     Rate and Rhythm: Normal rate and regular rhythm.     Pulses: Normal pulses.     Heart sounds: Normal heart sounds.  Pulmonary:     Effort: Pulmonary effort is normal.     Breath sounds: Normal breath sounds. No wheezing, rhonchi or rales.  Lymphadenopathy:     Cervical: No cervical adenopathy.  Skin:    General: Skin is warm and dry.     Findings: Lesion present.          Comments: Erythematous, raised, lumpy appearing area of skin on left anterior forearm.  Neurological:     Mental Status: He is alert.     No results found for any visits on 09/29/22.      Assessment & Plan:   Problem List Items Addressed This Visit       Respiratory   OSA (obstructive sleep apnea)   Other Visit Diagnoses     Acute sinusitis, recurrence not specified, unspecified location    -  Primary   Relevant Medications   Doxycycline Fluticasone (FLONASE) 50 MCG/ACT nasal spray   Nasal congestion       Relevant Medications   fluticasone (FLONASE) 50 MCG/ACT nasal spray   Skin lesion  Relevant Orders   Ambulatory referral to Dermatology     Start doxycycline for sinusitis.  Flonase as needed.  Wear CPAP as tolerated due to current sinusitis.  Resume regular use when able.  Atypical appearing skin lesion on left anterior forearm.  Concerning given rapid growth.  Referral to dermatology for removal.  Given precautions for continued or worsening symptoms.  No orders of the defined types were placed in this encounter.   Return if symptoms worsen or fail to improve.  [name], MD

## 2022-10-03 ENCOUNTER — Encounter: Payer: Medicare HMO | Admitting: Rehabilitative and Restorative Service Providers"

## 2022-10-04 DIAGNOSIS — C44629 Squamous cell carcinoma of skin of left upper limb, including shoulder: Secondary | ICD-10-CM | POA: Diagnosis not present

## 2022-10-04 DIAGNOSIS — D485 Neoplasm of uncertain behavior of skin: Secondary | ICD-10-CM | POA: Diagnosis not present

## 2022-10-05 ENCOUNTER — Encounter: Payer: Self-pay | Admitting: Family Medicine

## 2022-10-06 ENCOUNTER — Other Ambulatory Visit: Payer: Self-pay | Admitting: Family Medicine

## 2022-10-11 ENCOUNTER — Ambulatory Visit: Payer: Medicare HMO | Admitting: Family Medicine

## 2022-10-18 ENCOUNTER — Encounter: Payer: Self-pay | Admitting: Family Medicine

## 2022-10-18 DIAGNOSIS — C44629 Squamous cell carcinoma of skin of left upper limb, including shoulder: Secondary | ICD-10-CM | POA: Insufficient documentation

## 2022-10-30 NOTE — Telephone Encounter (Signed)
disregard

## 2022-11-02 ENCOUNTER — Other Ambulatory Visit: Payer: Self-pay | Admitting: Family Medicine

## 2022-11-16 ENCOUNTER — Other Ambulatory Visit: Payer: Self-pay | Admitting: Adult Health

## 2022-12-05 DIAGNOSIS — C44629 Squamous cell carcinoma of skin of left upper limb, including shoulder: Secondary | ICD-10-CM | POA: Diagnosis not present

## 2022-12-05 DIAGNOSIS — L905 Scar conditions and fibrosis of skin: Secondary | ICD-10-CM | POA: Diagnosis not present

## 2022-12-07 ENCOUNTER — Encounter: Payer: Self-pay | Admitting: *Deleted

## 2022-12-07 NOTE — Progress Notes (Signed)
PATIENT: [name] [name] DOB: 1951/09/08  /R/EASON FOR VISIT: follow up HISTORY FROM: patient  Virtual Visit via Video Note  I connected with [name] [name] on 12/08/22 at 11:30 AM EDT by a video enabled telemedicine application located remotely at [facility] and verified that I am speaking with the correct person using two identifiers who was located at their own home in [place].    I discussed the limitations of evaluation and management by telemedicine and the availability of in person appointments. The patient expressed understanding and agreed to proceed.   PATIENT: [name] [name] DOB: [birth year]  REASON FOR VISIT: follow up HISTORY FROM: patient Primary neurologist: Dr. Jaynee Eagles Sleep neurologist: Dr. Rexene Alberts  HISTORY OF PRESENT ILLNESS: Today 12/08/22 :  [name] [name] is a 72 y.o. male with a history of OSA on CPAP and Migraine. Returns today for follow-up.   OSA: working well. Makes a difference in how he feels. Will snore and have congestion if he doesn't use it.  CPAP download is below  Migraines: Ajovy is working well. Only have 2-3 migraines a month.  Continues on amitriptyline, gabapentin Cymbalta.  He was able to eliminate tizanidine but recently had a restarted.  He does plan to wean himself back off of the medication.     05/31/22: Mr. [name] [name] is a 72 year old male with a history of OSA on CPAP and Migraine headaches. He returns today for follow-up. CPAP is working well. Some nights he takes the mask off unknowingly. Also reports that recently if he only gets about 5 hours of sleep.  He likes to wake up early to go for walks.  Tends to go to bed late.  In regards to his migraines he reports that he has not had a migraine in 6 weeks.  He remains on Cymbalta, gabapentin, tizanidine, amitriptyline and Ajovy.  He reports that he has reduced his medication.  He is only taking tizanidine at bedtime and gabapentin at bedtime.  He is nervous to  further reduce his medication for the fear of having his migraines return.     04/14/21: [name] [name] is a 72 year old male with a history of obstructive sleep apnea on CPAP.  He reports that the CPAP continues to work well for him.  He states on occasion there may be a night that he falls asleep without putting it on.  There is also some nights that he may take it off and go back to sleep.  He states that his migraines were cut in half once he started using the CPAP.  He reports his migraines have been under relatively good control.  He remains on amitriptyline, gabapentin, tizanidine, Cymbalta and Ajovy for prevention.  He continues to use sumatriptan hand for abortive therapy.    01/01/20: [name] [name] is a 72 year old male with a history of obstructive sleep apnea on CPAP.  He returns today for follow-up.  His download indicates that he uses his machine nightly for compliance of 100%.  He uses machine greater than 4 hours 25 days for compliance of 83%.  On average he uses his machine 5 hours and 54 minutes.  His residual AHI is 3.7 on 7 to 14 cm of water with EPR of 3.  Leak in the 95th percentile is 17.4 L/min.  He reports that the CPAP is working well for him.  He denies any new issues.  HISTORY 12/26/2018: Please also see below for documentation of the virtual  visit.   I reviewed his AutoPap compliance data from 11/24/2018 through 12/23/2018 which is a total of 30 days, during which time he used his machine every night with percent used days greater than 4 hours at 90%, indicating excellent compliance with an average usage of 6 hours and 4 minutes, residual AHI at goal at 4.2 per hour, 95th percentile of pressure at 12.6 cm, leak on the higher end with the 95th percentile at 18.9 L/m on a pressure range of 7 cm to 14 cm with EPR.   The patient's allergies, current medications, family history, past medical history, past social history, past surgical history and problem list were reviewed and updated  as appropriate.     REVIEW OF SYSTEMS: Out of a complete 14 system review of symptoms, the patient complains only of the following symptoms, and all other reviewed systems are negative.  See HPI  ALLERGIES: Allergies  Allergen Reactions   Shrimp [Shellfish Allergy] Anaphylaxis and Rash   Codeine Rash and Other (See Comments)    Blacked out   Contrast Media [Iodinated Contrast Media] Rash   Diazepam Rash    Rash over whole body from neck down, and he lost his voice (airway compromise?)   Amoxicillin Other (See Comments)    Unknown    Benadryl [Diphenhydramine]     With dyes    Depakote [Divalproex Sodium] Swelling    Per pts report (given by Dr. Inda Merlin).  Has throat swelling and rash.   Erythromycin Other (See Comments)    unknown   Flexeril [Cyclobenzaprine]    Allegra [Fexofenadine] Rash        Betadine [Povidone Iodine] Rash   Celebrex [Celecoxib] Rash   Meperidine And Related Rash   Methocarbamol Rash   Neosporin [Neomycin-Bacitracin Zn-Polymyx] Rash   Penicillins Rash   Plavix [Clopidogrel] Rash   Rivaroxaban Rash   Vioxx [Rofecoxib] Rash    HOME MEDICATIONS: Outpatient Medications Prior to Visit  Medication Sig Dispense Refill   acetaminophen (TYLENOL) 500 MG tablet Take 2 tablets (1,000 mg total) by mouth every 6 (six) hours as needed for mild pain or moderate pain. 60 tablet 1   allopurinol (ZYLOPRIM) 100 MG tablet TAKE 1 TABLET EVERY DAY 90 tablet 1   amitriptyline (ELAVIL) 100 MG tablet TAKE 1 TABLET AT BEDTIME 90 tablet 0   DULoxetine (CYMBALTA) 60 MG capsule TAKE 1 CAPSULE EVERY DAY 90 capsule 3   EPINEPHRINE 0.3 mg/0.3 mL IJ SOAJ injection INJECT 0.3 MLS INTO THE MUSCLE ONCE 2 each 0   fluticasone (FLONASE) 50 MCG/ACT nasal spray Place 1 spray into both nostrils daily. 16 g 1   Fremanezumab-vfrm (AJOVY) 225 MG/1.5ML SOAJ Inject 225 mg into the skin every 30 (thirty) days. 4.5 mL 3   gabapentin (NEURONTIN) 300 MG capsule Take 1 capsule (300 mg total) by  mouth at bedtime. (Patient taking differently: Take 300 mg by mouth 2 (two) times daily as needed (pain).) 90 capsule 1   naproxen (NAPROSYN) 500 MG tablet Take 1 tablet (500 mg total) by mouth 3 (three) times daily with meals. 30 tablet 0   NON FORMULARY Pt uses a cpap nightly     ondansetron (ZOFRAN-ODT) 4 MG disintegrating tablet 4mg  ODT q4 hours prn nausea/vomit 6 tablet 0   oxyCODONE (ROXICODONE) 5 MG immediate release tablet Take 1 tablet (5 mg total) by mouth every 6 (six) hours as needed for severe pain. (Patient not taking: Reported on 09/29/2022) 12 tablet 0   pravastatin (PRAVACHOL) 40 MG tablet  TAKE 1 TABLET EVERY DAY 90 tablet 3   Psyllium (METAMUCIL PO) Take 2 tablets by mouth 2 (two) times daily.     Semaglutide,0.25 or 0.5MG /DOS, (OZEMPIC, 0.25 OR 0.5 MG/DOSE,) 2 MG/1.5ML SOPN Inject 0.5 mg into the skin once a week. 4.5 mL 2   SUMAtriptan (IMITREX) 100 MG tablet TAKE 1 TABLET AS NEEDED FOR MIGRAINE. MAY REPEAT IN 2 HOURS IF HEADACHE PERSISTS OR RECURS. MAX 2 TABLETS IN 24 HOURS 27 tablet 3   tamsulosin (FLOMAX) 0.4 MG CAPS capsule Take 1 capsule (0.4 mg total) by mouth daily. 14 capsule 0   tiZANidine (ZANAFLEX) 4 MG tablet TAKE 1 TABLET THREE TIMES DAILY AS NEEDED FOR MUSCLE SPASM(S) 270 tablet 0   valACYclovir (VALTREX) 500 MG tablet Take 500 mg by mouth daily as needed (outbreaks).     Facility-Administered Medications Prior to Visit  Medication Dose Route Frequency Provider Last Rate Last Admin   0.9 %  sodium chloride infusion  500 mL Intravenous Once Ladene Artist, MD        PAST MEDICAL HISTORY: Past Medical History:  Diagnosis Date   ALLERGIC RHINITIS 04/22/2007   Allergy    Cancer ([place])    HX SKIN CANCER   Complication of anesthesia    more concerned about positioning of head & neck because of TMJ   DDD (degenerative disc disease), cervical    GERD (gastroesophageal reflux disease)    infrequently   Headache(784.0)    migraines   History of kidney stones     History of skin cancer    History of TMJ disorder    HYPERCHOLESTEROLEMIA 04/22/2007   HYPERLIPIDEMIA 07/29/2007   Hyperplastic colon polyp    NEPHROLITHIASIS, HX OF 07/29/2007   Osteoarth NOS-Unspec 04/22/2007   Pneumonia    Shortness of breath    Shoulder pain, acute    bilateral    PAST SURGICAL HISTORY: Past Surgical History:  Procedure Laterality Date   APPENDECTOMY     BACK SURGERY  10/07/2015   COLONOSCOPY     10 yrs ago- 2 HPP    COLONOSCOPY  2019   CYSTOSCOPY/URETEROSCOPY/HOLMIUM LASER/STENT PLACEMENT Right 07/26/2022   Procedure: RIGHT URETEROSCOPY/HOLMIUM LASER/STENT PLACEMENT;  Surgeon: Vira Agar, MD;  Location: [facility];  Service: Urology;  Laterality: Right;  75 MINUTES NEEDED FOR CASE   JOINT REPLACEMENT     KNEE ARTHROSCOPY     right x2   SHOULDER SURGERY     right   skin cancer biopsy     L forehead   TOTAL KNEE ARTHROPLASTY Right 08/25/2013   Procedure: RIGHT TOTAL KNEE ARTHROPLASTY;  Surgeon: Gearlean Alf, MD;  Location: [facility];  Service: Orthopedics;  Laterality: Right;   TOTAL SHOULDER ARTHROPLASTY Left 12/25/2013   DR SUPPLE    TOTAL SHOULDER ARTHROPLASTY Left 12/25/2013   Procedure: LEFT TOTAL SHOULDER ARTHROPLASTY;  Surgeon: Marin Shutter, MD;  Location: [place];  Service: Orthopedics;  Laterality: Left;   TOTAL SHOULDER ARTHROPLASTY Right 06/18/2014   DR SUPPLE   TOTAL SHOULDER ARTHROPLASTY Right 06/18/2014   Procedure: RIGHT TOTAL SHOULDER ARTHROPLASTY;  Surgeon: Marin Shutter, MD;  Location: [place];  Service: Orthopedics;  Laterality: Right;   WISDOM TOOTH EXTRACTION      FAMILY HISTORY: Family History  Problem Relation Age of Onset   COPD Father        family hx   Emphysema Father    Aneurysm Mother    Stroke Mother    Sudden death Mother  Cancer Mother    Obesity Mother    Breast cancer Mother    Cancer Maternal Grandfather        lung   Colon cancer Neg Hx    Colon polyps Neg Hx     SOCIAL HISTORY: Social History    Socioeconomic History   Marital status: Married    Spouse name: Not on file   Number of children: 1   Years of education: 16+   Highest education level: Bachelor's degree (e.g., BA, AB, BS)  Occupational History   Occupation: Retired  Tobacco Use   Smoking status: Never   Smokeless tobacco: Never  Vaping Use   Vaping Use: Never used  Substance and Sexual Activity   Alcohol use: No    Alcohol/week: 0.0 standard drinks of alcohol   Drug use: No   Sexual activity: Yes    Partners: Female    Birth control/protection: None  Other Topics Concern   Not on file  Social History Narrative   Lives at home w/ his wife   Right-handed   Caffeine: 2 cups of coffee each morning   Social Determinants of Health   Financial Resource Strain: Low Risk  (09/05/2022)   Overall Financial Resource Strain (CARDIA)    Difficulty of Paying Living Expenses: Not hard at all  Food Insecurity: No Food Insecurity (09/05/2022)   Hunger Vital Sign    Worried About Running Out of Food in the Last Year: Never true    Ran Out of Food in the Last Year: Never true  Transportation Needs: No Transportation Needs (09/05/2022)   PRAPARE - Hydrologist (Medical): No    Lack of Transportation (Non-Medical): No  Physical Activity: Sufficiently Active (09/05/2022)   Exercise Vital Sign    Days of Exercise per Week: 7 days    Minutes of Exercise per Session: 90 min  Stress: No Stress Concern Present (09/05/2022)   [place]    Feeling of Stress : Not at all  Social Connections: [place] (09/05/2022)   Social Connection and Isolation Panel [NHANES]    Frequency of Communication with Friends and Family: More than three times a week    Frequency of Social Gatherings with Friends and Family: More than three times a week    Attends Religious Services: More than 4 times per year    Active Member of [employer] or  Organizations: Yes    Attends Archivist Meetings: More than 4 times per year    Marital Status: Married  Human resources officer Violence: Not At Risk (09/05/2022)   Humiliation, Afraid, Rape, and Kick questionnaire    Fear of Current or Ex-Partner: No    Emotionally Abused: No    Physically Abused: No    Sexually Abused: No      PHYSICAL EXAM Generalized: Well developed, in no acute distress   Neurological examination  Mentation: Alert oriented to time, place, history taking. Follows all commands speech and language fluent Cranial nerve II-XII: Facial symmetry noted Reflexes: UTA  DIAGNOSTIC DATA (LABS, IMAGING, TESTING) - I reviewed patient records, labs, notes, testing and imaging myself where available.  Lab Results  Component Value Date   WBC 10.0 07/06/2022   HGB 15.5 07/06/2022   HCT 45.1 07/06/2022   MCV 94.5 07/06/2022   PLT 247 07/06/2022      Component Value Date/Time   NA 133 (L) 07/06/2022 1816   NA 146 (H) 12/08/2021 1134  K 4.6 07/06/2022 1816   CL 99 07/06/2022 1816   CO2 25 07/06/2022 1816   GLUCOSE 108 (H) 07/06/2022 1816   GLUCOSE 94 08/20/2006 0830   BUN 24 (H) 07/06/2022 1816   BUN 28 (H) 12/08/2021 1134   CREATININE 1.35 (H) 07/06/2022 1816   CREATININE 1.38 (H) 06/09/2020 0811   CALCIUM 9.7 07/06/2022 1816   PROT 6.6 12/08/2021 1134   ALBUMIN 4.4 12/08/2021 1134   AST 32 12/08/2021 1134   ALT 30 12/08/2021 1134   ALKPHOS 47 12/08/2021 1134   BILITOT 0.4 12/08/2021 1134   GFRNONAA 56 (L) 07/06/2022 1816   GFRAA 78 08/17/2020 1011   Lab Results  Component Value Date   CHOL 210 (H) 12/08/2021   HDL 61 12/08/2021   LDLCALC 126 (H) 12/08/2021   LDLDIRECT 135.0 07/16/2013   TRIG 133 12/08/2021   CHOLHDL 2.7 06/09/2020   Lab Results  Component Value Date   HGBA1C 5.4 07/19/2022   No results found for: "VITAMINB12" Lab Results  Component Value Date   TSH 2.80 04/18/2021      ASSESSMENT AND PLAN 72 y.o. year old male  has  a past medical history of ALLERGIC RHINITIS (04/22/2007), Allergy, Cancer ([place]), Complication of anesthesia, DDD (degenerative disc disease), cervical, GERD (gastroesophageal reflux disease), Headache(784.0), History of kidney stones, History of skin cancer, History of TMJ disorder, HYPERCHOLESTEROLEMIA (04/22/2007), HYPERLIPIDEMIA (07/29/2007), Hyperplastic colon polyp, NEPHROLITHIASIS, HX OF (07/29/2007), Osteoarth NOS-Unspec (04/22/2007), Pneumonia, Shortness of breath, and Shoulder pain, acute. here with:  1.  Obstructive sleep apnea on CPAP  -Good compliance -Good treatment of apnea -Encouraged to use CPAP nightly and greater than 4 hours each night  2.  Migraine headaches  -Continue amitriptyline 100 mg at bedtime -Continue gabapentin 300 mg At bedtime -Continue Cymbalta 60 mg daily -Continue Ajovy monthly injection -Continue sumatriptan for abortive therapy -Continue tizanidine at bedtime- will try to eliminate in the future -Encouraged the patient again that we should try to start decreasing some of his medication to see if we can eliminate any medications from his med list.  Patient voiced understanding.   FU in 1 year or sooner if needed.      Ward Givens, MSN, NP-C 12/08/2022, 11:26 AM [facility] [address] [place], [place] [address] [phone]

## 2022-12-08 ENCOUNTER — Telehealth: Payer: Medicare HMO | Admitting: Adult Health

## 2022-12-08 DIAGNOSIS — G4733 Obstructive sleep apnea (adult) (pediatric): Secondary | ICD-10-CM

## 2022-12-08 DIAGNOSIS — G43109 Migraine with aura, not intractable, without status migrainosus: Secondary | ICD-10-CM | POA: Diagnosis not present

## 2022-12-08 NOTE — Patient Instructions (Signed)
Continue using CPAP nightly  Continue amitriptyline, gabapentin, Cymbalta, Ajovy and tizanidine. Okay to continue using sumatriptan for abortive therapy  Please consider eliminating tizanidine and see how your headaches respond.  Would like to also consider decreasing other medications in the future to see what you can tolerate.

## 2023-01-12 ENCOUNTER — Encounter: Payer: Self-pay | Admitting: Adult Health

## 2023-01-17 ENCOUNTER — Telehealth: Payer: Self-pay | Admitting: *Deleted

## 2023-01-17 NOTE — Telephone Encounter (Signed)
Gabapentin PA completed on CMM. Key: [id number]. Awaiting determination from [facility].

## 2023-01-17 NOTE — Telephone Encounter (Signed)
Received a fax from [facility] stating Gabapentin 300 mg capsule has been approved through 09/25/2023. I updated the patient.

## 2023-01-29 ENCOUNTER — Other Ambulatory Visit: Payer: Self-pay | Admitting: Adult Health

## 2023-03-01 IMAGING — CT CT RENAL STONE PROTOCOL
2 of 4 series · 16 of 46 positions shown, 18 images · non-contrast
Comparison: None.

CLINICAL DATA: Flank pain and 4 episodes of emesis

EXAM:
CT ABDOMEN AND PELVIS WITHOUT CONTRAST
TECHNIQUE: Multidetector CT imaging of the abdomen and pelvis was performed
following the standard protocol without IV contrast.

[Series 3: stone full · axial · 0.90mm/px · z∈[+782,+1247]mm · 13 of 103 slices shown, 15 images]
[im 5/103  soft-tissue]
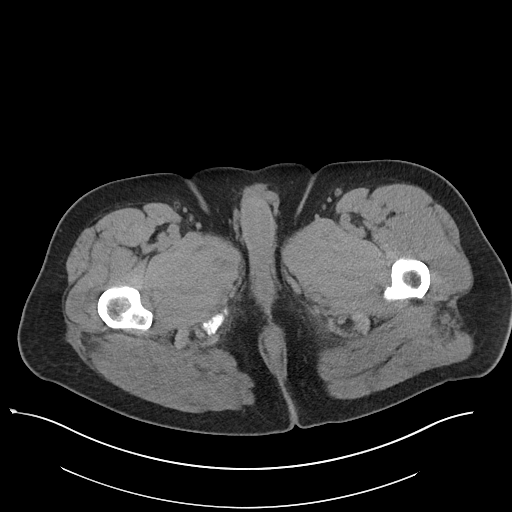
[im 5/103  bone]
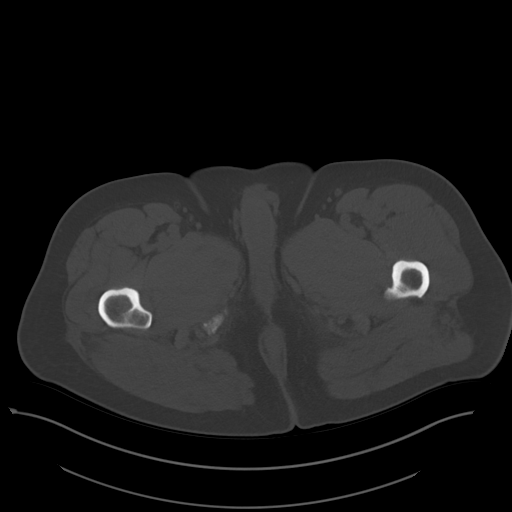
[im 13/103  soft-tissue]
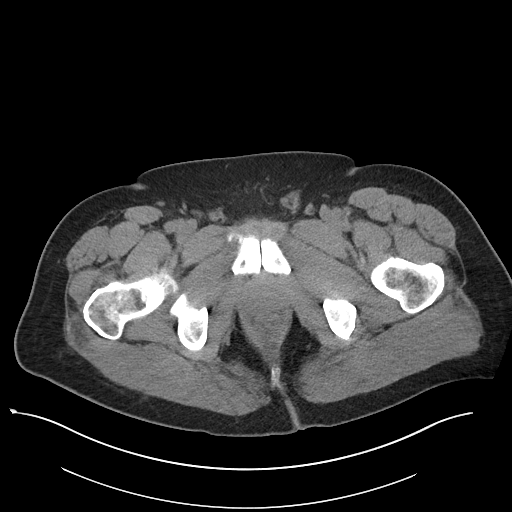
[im 22/103  soft-tissue]
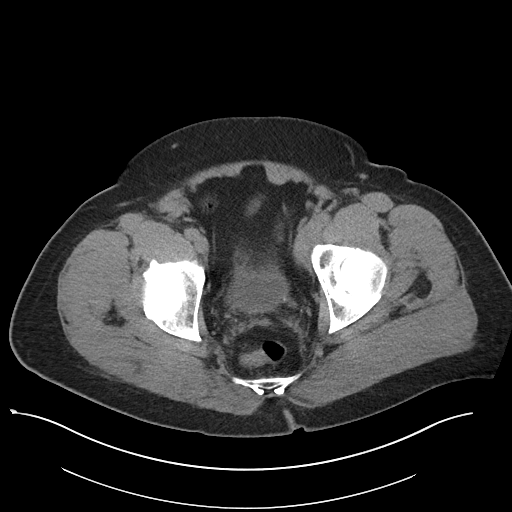
[im 30/103  soft-tissue]
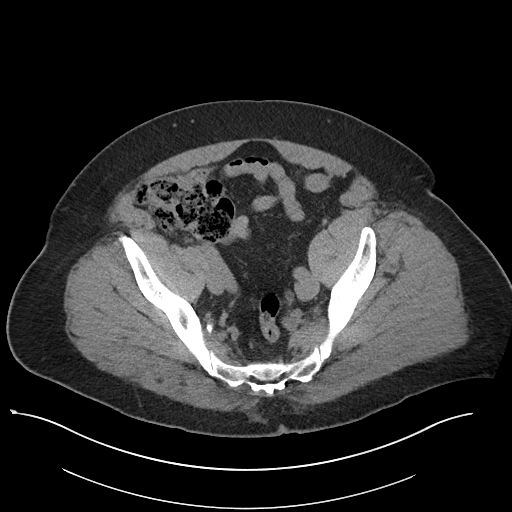
[im 35/103  soft-tissue]
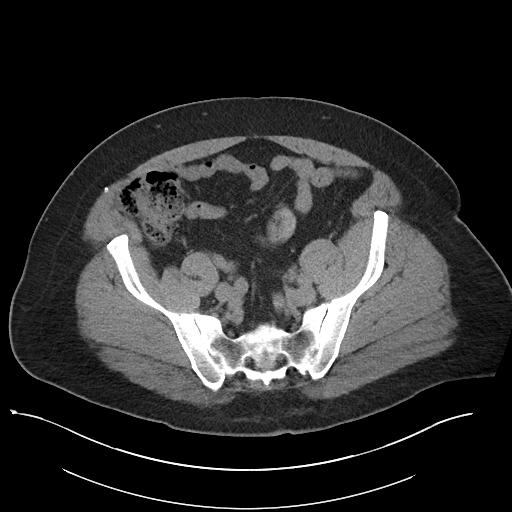
[im 43/103  soft-tissue]
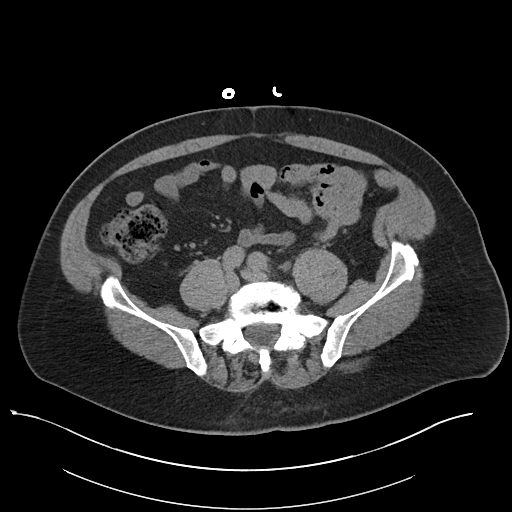
[im 52/103  soft-tissue]
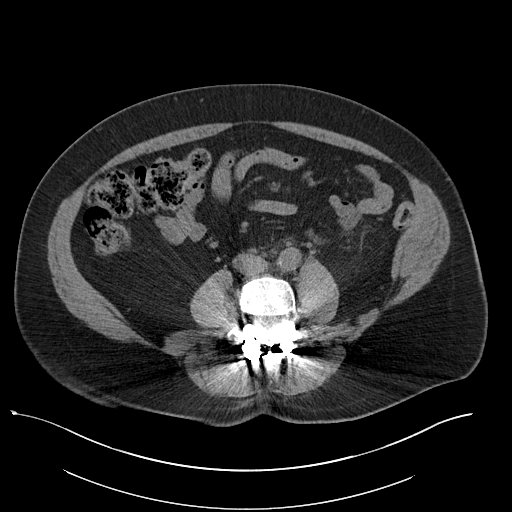
[im 60/103  soft-tissue]
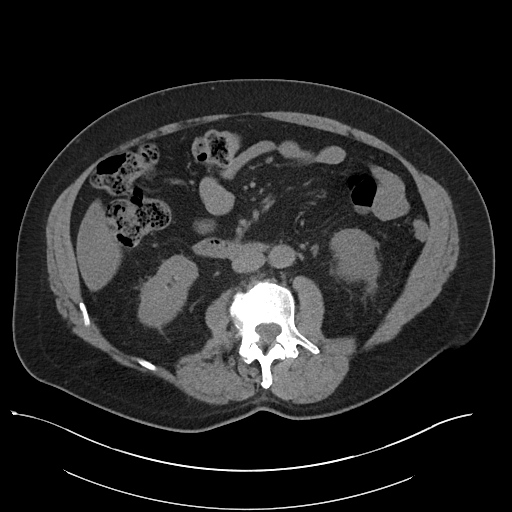
[im 69/103  soft-tissue]
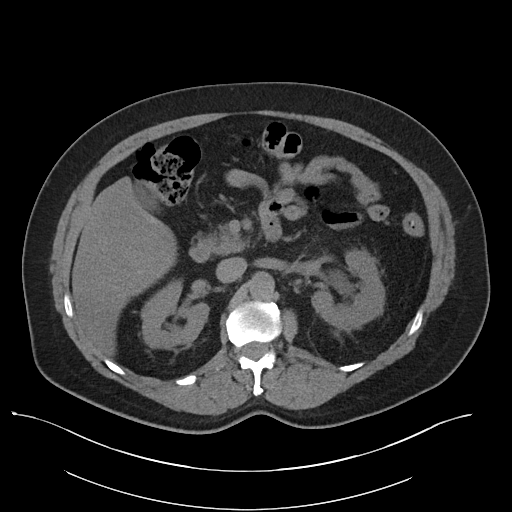
[im 69/103  bone]
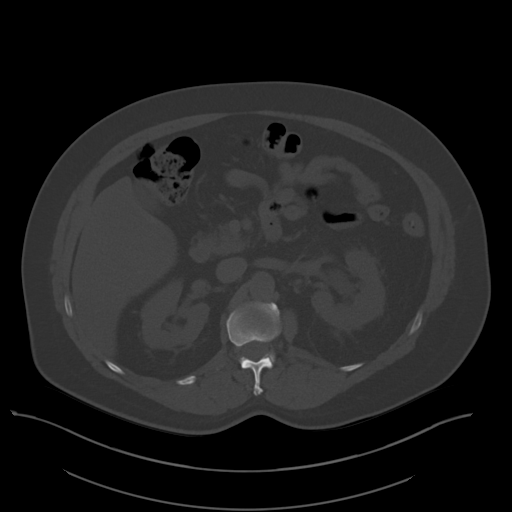
[im 73/103  soft-tissue]
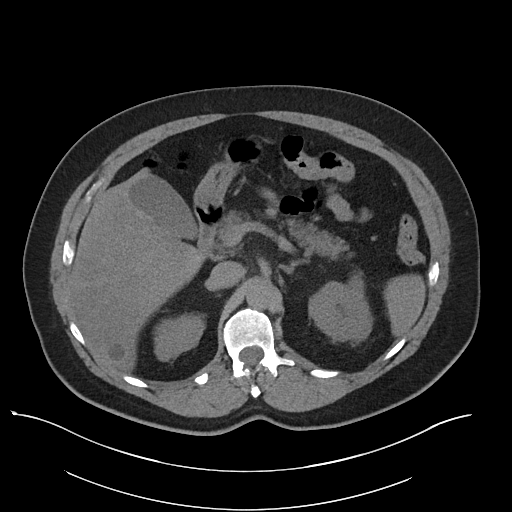
[im 81/103  soft-tissue]
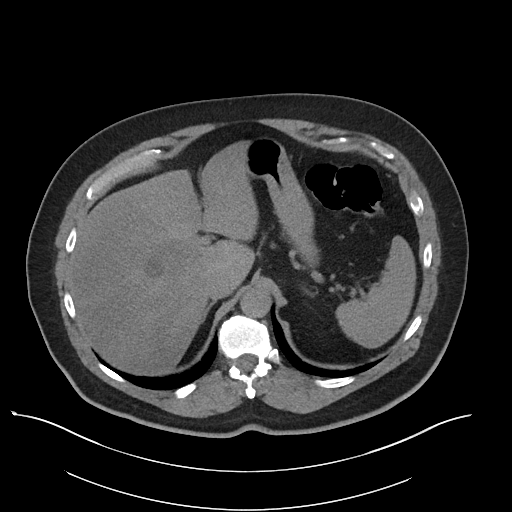
[im 90/103  soft-tissue]
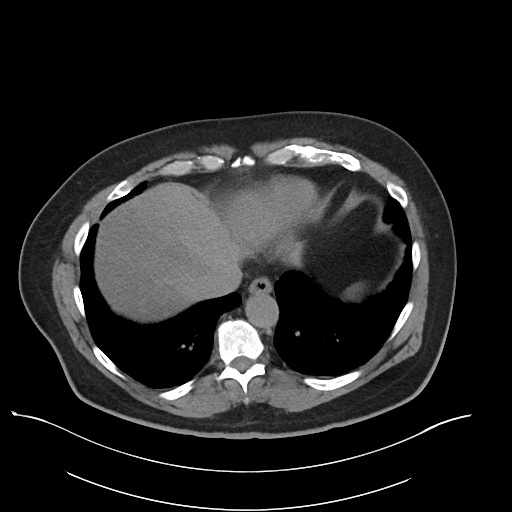
[im 98/103  soft-tissue]
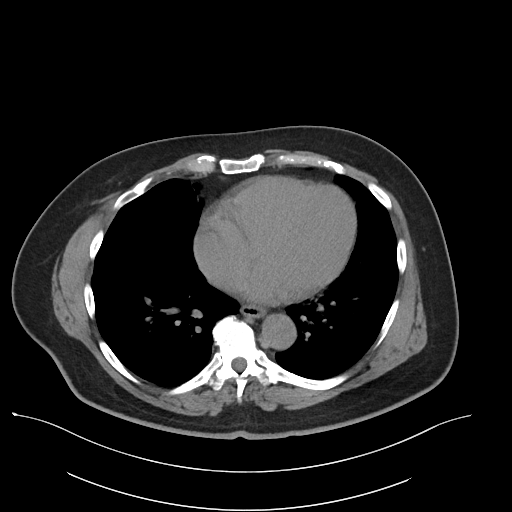

[Series 6: coronal · coronal · 0.80mm/px · 3 of 99 slices shown]
[im 33/99  soft-tissue]
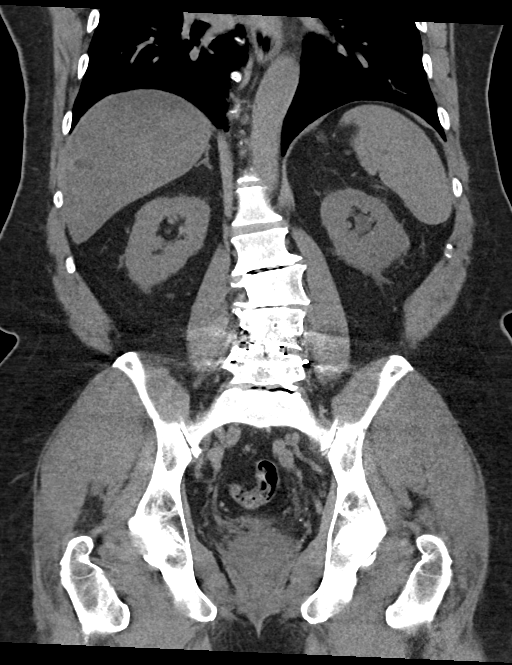
[im 44/99  soft-tissue]
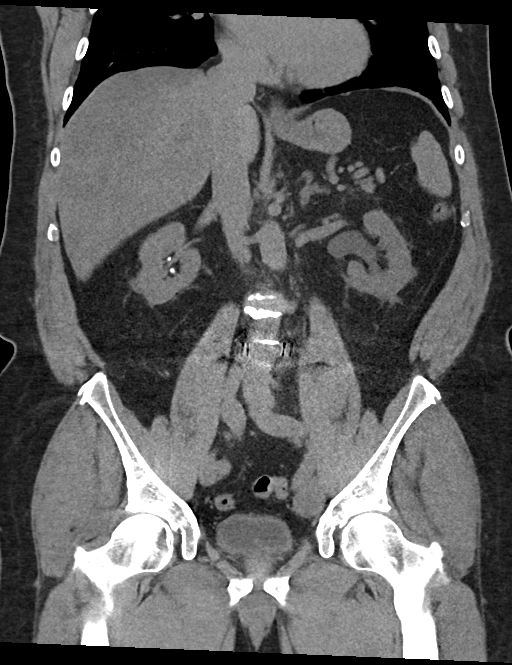
[im 55/99  soft-tissue]
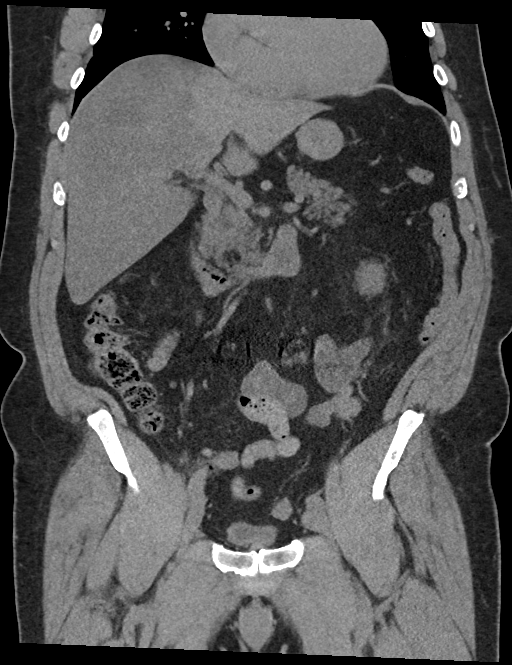

[16 of 46 positions shown; findings below may reference images not displayed]

FINDINGS: Lower chest: 5 mm lingular nodule (image [DATE])

Hepatobiliary: Several round lesions in liver have simple fluid
attenuation typical of benign hepatic cysts. Liver does have
heterogeneous density suggestive of regional fatty infiltration.
Gallbladder normal.

Pancreas: Pancreas is normal. No ductal dilatation. No pancreatic
inflammation.

Spleen: Normal spleen

Adrenals/urinary tract: Adrenal glands normal.

Mild hydronephrosis hydroureter on the LEFT. There is obstructing
calculus in the distal LEFT ureter at the vascular junction. This
calculus measures 5 mm on image 83/3.

Nonobstructing calculus in the RIGHT kidney measures 5 mm. No RIGHT
ureterolithiasis.

Stomach/Bowel: Stomach, small bowel, appendix, and cecum are normal.
The colon and rectosigmoid colon are normal.

Vascular/Lymphatic: Abdominal aorta is normal caliber. No periportal
or retroperitoneal adenopathy. No pelvic adenopathy.

Reproductive: Prostate unremarkable

Other: No free fluid.

Musculoskeletal: No aggressive osseous lesion.
IMPRESSION: 1. Mildly obstructing calculus in the distal LEFT ureter at the
vesicoureteral junction. Mild hydronephrosis and hydroureter on the
LEFT.
2. Nonobstructing RIGHT renal calculus.
3. Heterogeneous density within liver could indicate regional
hepatic steatosis. Consider MRI of the liver for further evaluation.
Several round benign-appearing cysts within liver could also be
characterized at that time.

## 2023-03-11 IMAGING — DX DG CHEST 2V
2 series · 2 of 2 positions shown · non-contrast
Comparison: 01/17/2019

CLINICAL DATA: 70-year-old male with shortness of breath

EXAM:
CHEST - 2 VIEW

[chest pa]
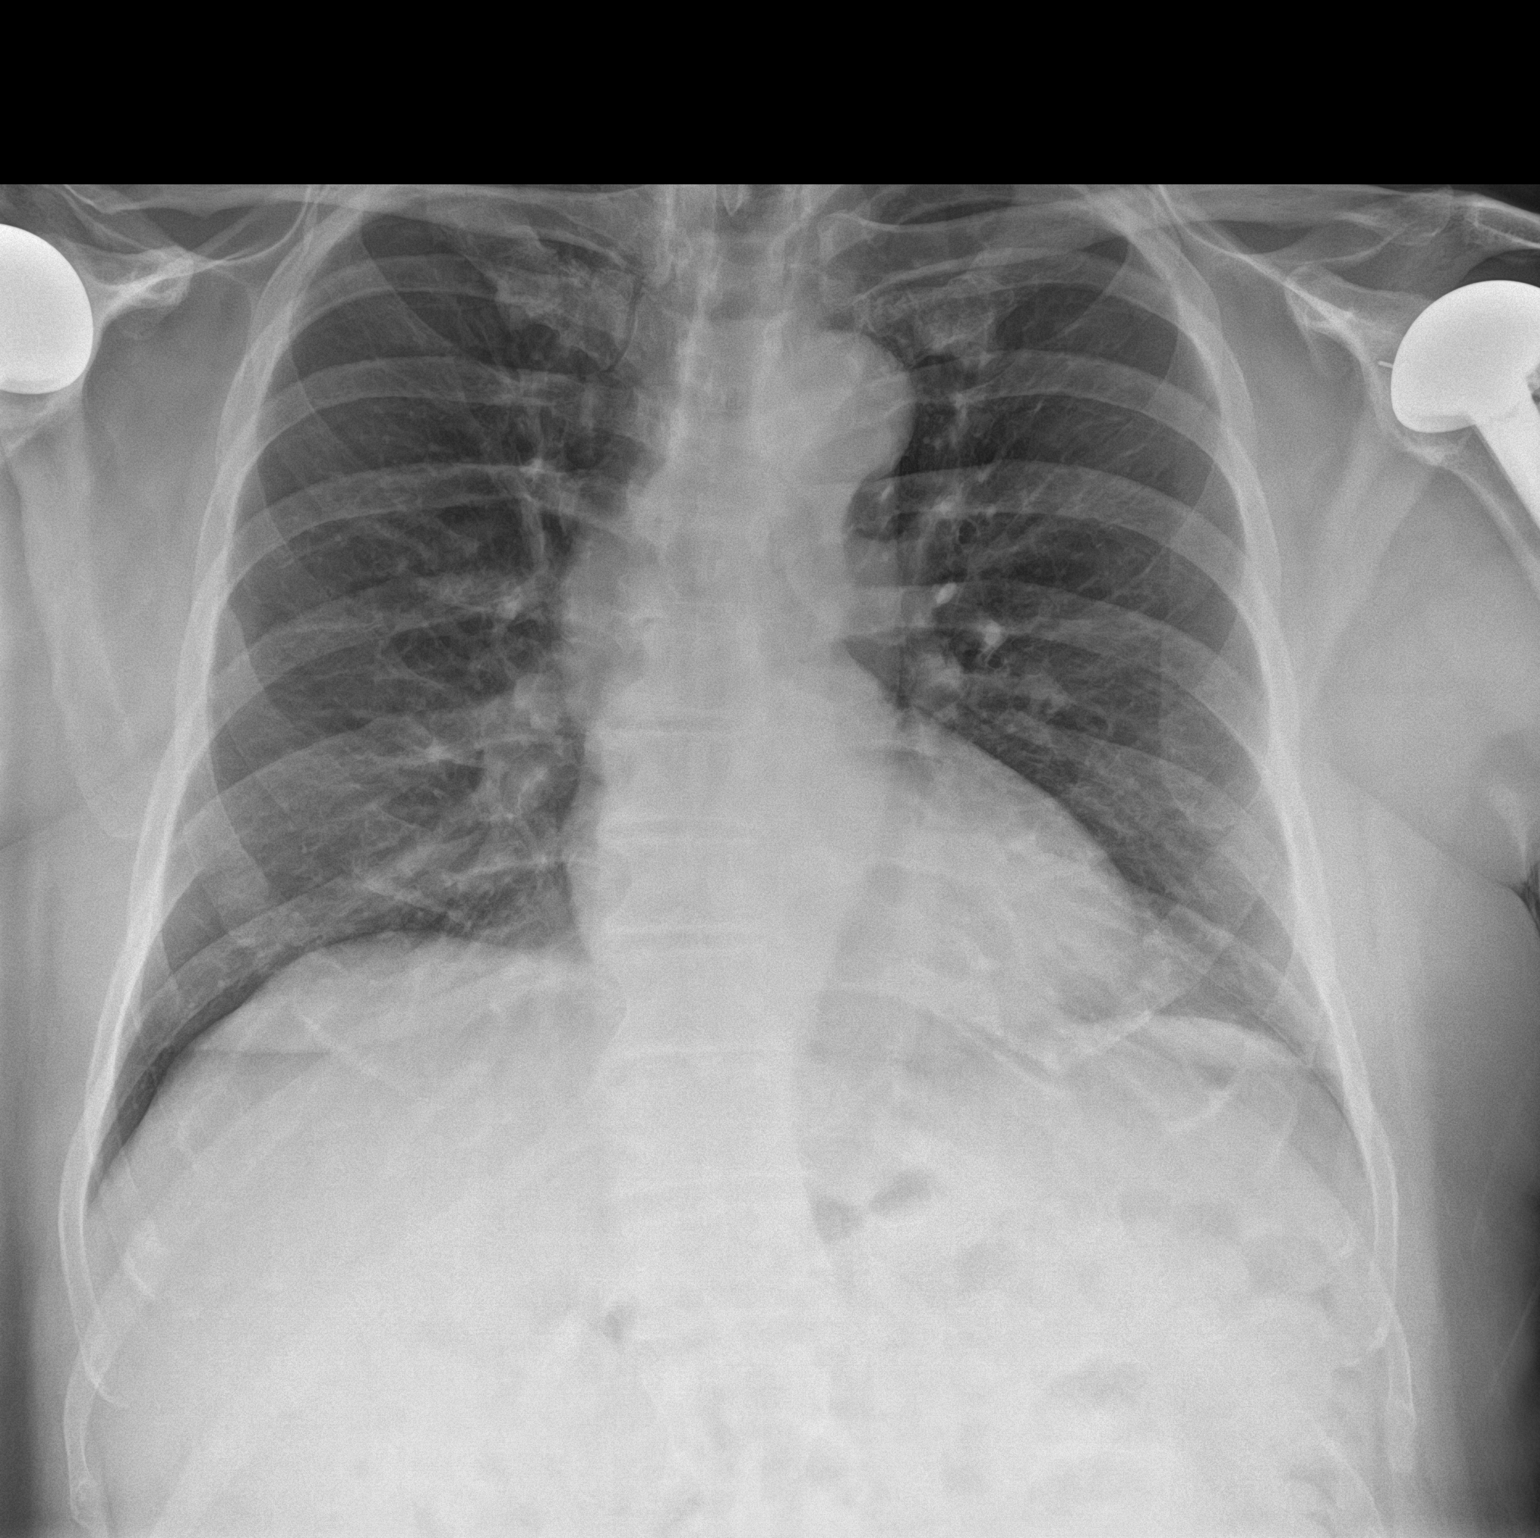

[chest lat]
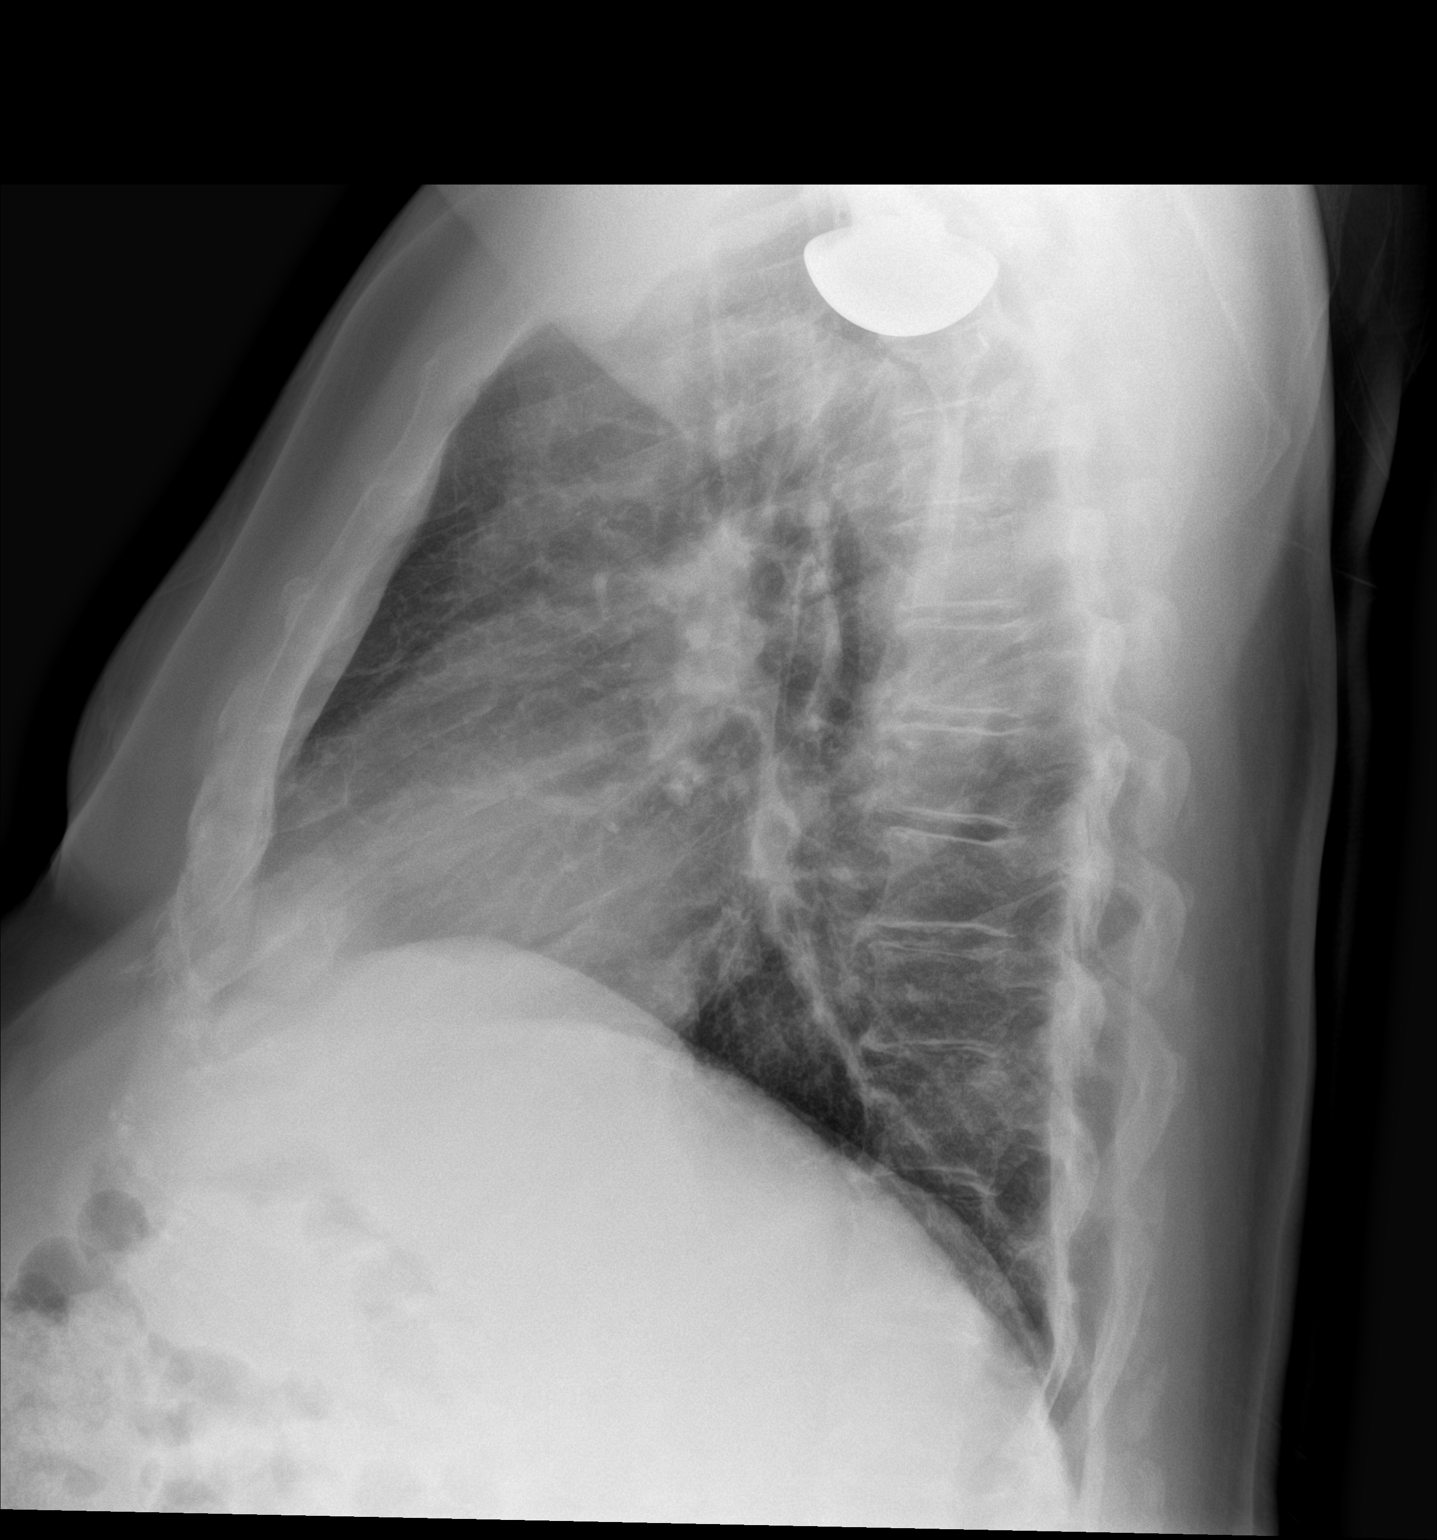

[2 of 2 positions shown; findings below may reference images not displayed]

FINDINGS: Cardiomediastinal silhouette unchanged in size and contour. No
evidence of central vascular congestion. No interlobular septal
thickening.

No pneumothorax or pleural effusion. Coarsened interstitial
markings, with no confluent airspace disease.

No acute displaced fracture. Degenerative changes of the spine. Mild
right apex scoliotic curvature.

Surgical changes of the shoulders again demonstrated
IMPRESSION: Negative for acute cardiopulmonary disease, with similar appearance
of the chest x-ray

## 2023-03-14 ENCOUNTER — Ambulatory Visit (INDEPENDENT_AMBULATORY_CARE_PROVIDER_SITE_OTHER): Payer: Medicare HMO | Admitting: Family Medicine

## 2023-03-14 ENCOUNTER — Other Ambulatory Visit: Payer: Self-pay | Admitting: Adult Health

## 2023-03-14 ENCOUNTER — Encounter: Payer: Self-pay | Admitting: Family Medicine

## 2023-03-14 VITALS — BP 116/68 | HR 60 | Temp 97.8°F | Ht 69.0 in | Wt 231.4 lb

## 2023-03-14 DIAGNOSIS — Z0000 Encounter for general adult medical examination without abnormal findings: Secondary | ICD-10-CM

## 2023-03-14 DIAGNOSIS — J302 Other seasonal allergic rhinitis: Secondary | ICD-10-CM

## 2023-03-14 DIAGNOSIS — E669 Obesity, unspecified: Secondary | ICD-10-CM

## 2023-03-14 DIAGNOSIS — G43711 Chronic migraine without aura, intractable, with status migrainosus: Secondary | ICD-10-CM

## 2023-03-14 DIAGNOSIS — Z125 Encounter for screening for malignant neoplasm of prostate: Secondary | ICD-10-CM | POA: Diagnosis not present

## 2023-03-14 DIAGNOSIS — R635 Abnormal weight gain: Secondary | ICD-10-CM

## 2023-03-14 DIAGNOSIS — Z7985 Long-term (current) use of injectable non-insulin antidiabetic drugs: Secondary | ICD-10-CM | POA: Diagnosis not present

## 2023-03-14 DIAGNOSIS — M25562 Pain in left knee: Secondary | ICD-10-CM

## 2023-03-14 DIAGNOSIS — E782 Mixed hyperlipidemia: Secondary | ICD-10-CM

## 2023-03-14 DIAGNOSIS — Z6834 Body mass index (BMI) 34.0-34.9, adult: Secondary | ICD-10-CM

## 2023-03-14 DIAGNOSIS — E1169 Type 2 diabetes mellitus with other specified complication: Secondary | ICD-10-CM

## 2023-03-14 LAB — MICROALBUMIN / CREATININE URINE RATIO
Creatinine,U: 216.4 mg/dL
Microalb Creat Ratio: 0.3 mg/g (ref 0.0–30.0)
Microalb, Ur: <0.7 mg/dL (ref 0.0–1.9)

## 2023-03-14 LAB — COMPREHENSIVE METABOLIC PANEL
ALT: 29 U/L (ref 0–53)
AST: 28 U/L (ref 0–37)
Albumin: 3.8 g/dL (ref 3.5–5.2)
Alkaline Phosphatase: 40 U/L (ref 39–117)
BUN: 28 mg/dL — ABNORMAL HIGH (ref 6–23)
CO2: 26 mEq/L (ref 19–32)
Calcium: 8.7 mg/dL (ref 8.4–10.5)
Chloride: 104 mEq/L (ref 96–112)
Creatinine, Ser: 1.18 mg/dL (ref 0.40–1.50)
GFR: 61.87 mL/min (ref >60.00)
Glucose, Bld: 96 mg/dL (ref 70–99)
Potassium: 4.4 mEq/L (ref 3.5–5.1)
Sodium: 138 mEq/L (ref 135–145)
Total Bilirubin: 0.5 mg/dL (ref 0.2–1.2)
Total Protein: 6.9 g/dL (ref 6.0–8.3)

## 2023-03-14 LAB — CBC WITH DIFFERENTIAL/PLATELET
Basophils Absolute: 0 10*3/uL (ref 0.0–0.1)
Basophils Relative: 1.2 % (ref 0.0–3.0)
Eosinophils Absolute: 0.2 10*3/uL (ref 0.0–0.7)
Eosinophils Relative: 4.5 % (ref 0.0–5.0)
HCT: 44.1 % (ref 39.0–52.0)
Hemoglobin: 14.6 g/dL (ref 13.0–17.0)
Lymphocytes Relative: 44.7 % (ref 12.0–46.0)
Lymphs Abs: 1.8 10*3/uL (ref 0.7–4.0)
MCHC: 33.2 g/dL (ref 30.0–36.0)
MCV: 97.3 fl (ref 78.0–100.0)
Monocytes Absolute: 0.4 10*3/uL (ref 0.1–1.0)
Monocytes Relative: 9.7 % (ref 3.0–12.0)
Neutro Abs: 1.6 10*3/uL (ref 1.4–7.7)
Neutrophils Relative %: 39.9 % — ABNORMAL LOW (ref 43.0–77.0)
Platelets: 234 10*3/uL (ref 150.0–400.0)
RBC: 4.54 Mil/uL (ref 4.22–5.81)
RDW: 13.6 % (ref 11.5–15.5)
WBC: 3.9 10*3/uL — ABNORMAL LOW (ref 4.0–10.5)

## 2023-03-14 LAB — TSH: TSH: 1.87 u[IU]/mL (ref 0.35–5.50)

## 2023-03-14 LAB — T4, FREE: Free T4: 0.54 ng/dL — ABNORMAL LOW (ref 0.60–1.60)

## 2023-03-14 LAB — LIPID PANEL
Cholesterol: 175 mg/dL (ref 0–200)
HDL: 52.7 mg/dL (ref >39.00)
LDL Cholesterol: 104 mg/dL — ABNORMAL HIGH (ref 0–99)
NonHDL: 122.36
Total CHOL/HDL Ratio: 3
Triglycerides: 90 mg/dL (ref 0.0–149.0)
VLDL: 18 mg/dL (ref 0.0–40.0)

## 2023-03-14 LAB — HEMOGLOBIN A1C: Hgb A1c MFr Bld: 5.7 % (ref 4.6–6.5)

## 2023-03-14 LAB — PSA: PSA: 2.65 ng/mL (ref 0.10–4.00)

## 2023-03-14 NOTE — Progress Notes (Signed)
   Established Patient Office Visit   Subjective  Patient ID: [name] [name], male    DOB: 1950-11-19  Age: 72 y.o. MRN: [id number]  Chief Complaint  Patient presents with  . Annual Exam    Blood work- A1c, cholesterol, trig,  Clindamycin, naproxen refill Itchy eyes- allergies Weight.-has been taken ozempic  (0.5) but has gained weight. 4 hour exercise plan. He and wife are concerned about wt gain. Wife concerned about him taking ozempic as he has gotten sick 6 times while taking it. Wants to know what to do     HPI  {History (Optional):23778}  ROS Negative unless stated above    Objective:     BP 116/68 (BP Location: Right Arm, Patient Position: Sitting, Cuff Size: Large)   Pulse 60   Temp 97.8 F (36.6 C) (Oral)   Ht 5\' 9"  (1.753 m)   Wt 231 lb 6.4 oz (105 kg)   SpO2 97%   BMI 34.17 kg/m  {Vitals History (Optional):23777}  Physical Exam   No results found for any visits on 03/14/23.    Assessment & Plan:  There are no diagnoses linked to this encounter.  No follow-ups on file.   Deeann Saint, MD

## 2023-04-12 ENCOUNTER — Other Ambulatory Visit: Payer: Self-pay | Admitting: Adult Health

## 2023-04-12 NOTE — Telephone Encounter (Signed)
Per note 12/08/22 "-Continue sumatriptan for abortive therapy " 1 year follow up scheduled on 12/04/23

## 2023-04-21 ENCOUNTER — Other Ambulatory Visit (INDEPENDENT_AMBULATORY_CARE_PROVIDER_SITE_OTHER): Payer: Self-pay | Admitting: Family Medicine

## 2023-05-02 ENCOUNTER — Other Ambulatory Visit: Payer: Self-pay | Admitting: Family Medicine

## 2023-05-22 DIAGNOSIS — D2372 Other benign neoplasm of skin of left lower limb, including hip: Secondary | ICD-10-CM | POA: Diagnosis not present

## 2023-05-22 DIAGNOSIS — D485 Neoplasm of uncertain behavior of skin: Secondary | ICD-10-CM | POA: Diagnosis not present

## 2023-05-22 DIAGNOSIS — L570 Actinic keratosis: Secondary | ICD-10-CM | POA: Diagnosis not present

## 2023-05-22 DIAGNOSIS — L578 Other skin changes due to chronic exposure to nonionizing radiation: Secondary | ICD-10-CM | POA: Diagnosis not present

## 2023-05-22 DIAGNOSIS — D045 Carcinoma in situ of skin of trunk: Secondary | ICD-10-CM | POA: Diagnosis not present

## 2023-05-22 DIAGNOSIS — Z85828 Personal history of other malignant neoplasm of skin: Secondary | ICD-10-CM | POA: Diagnosis not present

## 2023-05-22 DIAGNOSIS — D225 Melanocytic nevi of trunk: Secondary | ICD-10-CM | POA: Diagnosis not present

## 2023-05-22 DIAGNOSIS — L821 Other seborrheic keratosis: Secondary | ICD-10-CM | POA: Diagnosis not present

## 2023-05-22 NOTE — Progress Notes (Unsigned)
[name] Payor, PhD, LAT, ATC acting as a scribe for [name] Graham, MD.  [name] [name] is a 72 y.o. male who presents to [facility] today for L knee pain. Pt was previously seen by Dr. Denyse [name] on 08/30/22 for R elbow pain.  Today, pt c/o L knee pain x 6 wks. He was on a streak of walking 6+ miles/day for 103 consecutive days, at a brisk 15 min/mile. He was waling up a hill when his L knee pain started, almost causing him to fall. Pt locates pain to anterior aspect along the joint line.  L Knee swelling: yes- has improved some Mechanical symptoms: yes Aggravates: walking Treatments tried: none  Pertinent review of systems: No fevers or chills  Relevant historical information: Diabetes.  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   Exam:  BP 128/80   Pulse 74   Ht 5\' 9"  (1.753 m)   Wt 238 lb (108 kg)   SpO2 97%   BMI 35.15 kg/m  General: Well Developed, well nourished, and in no acute distress.   MSK: Left knee mild effusion normal motion.  Tender palpation medial joint line. Stable ligamentous exam. Intact strength. Positive McMurray test.    Lab and Radiology Results  Procedure: Real-time Ultrasound Guided Injection of left knee joint superior lateral patella space Device: Philips Affiniti 50G/GE Logiq Images permanently stored and available for review in PACS Verbal informed consent obtained.  Discussed risks and benefits of procedure. Warned about infection, bleeding, hyperglycemia damage to structures among others. Patient expresses understanding and agreement Time-out conducted.   Noted no overlying erythema, induration, or other signs of local infection.   Skin prepped in a sterile fashion.   Local anesthesia: Topical Ethyl chloride.   With sterile technique and under real time ultrasound guidance: 40 mg of Kenalog and 2 mL of Marcaine injected into knee joint. Fluid seen entering the joint capsule.    Completed without difficulty   Pain immediately resolved suggesting accurate placement of the medication.   Advised to call if fevers/chills, erythema, induration, drainage, or persistent bleeding.   Images permanently stored and available for review in the ultrasound unit.  Impression: Technically successful ultrasound guided injection.    X-ray images left knee obtained today personally and independently interpreted Mild medial and minimal patellofemoral DJD.  No acute fractures. Await formal radiology review    Assessment and Plan: 72 y.o. male with left knee pain thought to be due to exacerbation of DJD versus degenerative medial meniscus tear.  Plan for steroid injection.  Patient will keep me updated with how he feels.   PDMP not reviewed this encounter. Orders Placed This Encounter  Procedures   [name] LIMITED JOINT SPACE STRUCTURES LOW LEFT(NO LINKED CHARGES)    Order Specific Question:   Reason for Exam (SYMPTOM  OR DIAGNOSIS REQUIRED)    Answer:   left knee pain    Order Specific Question:   Preferred imaging location?    Answer:   [facility] Knee AP/LAT W/Sunrise Left    Standing Status:   Future    Number of Occurrences:   1    Standing Expiration Date:   06/23/2023    Order Specific Question:   Reason for Exam (SYMPTOM  OR DIAGNOSIS REQUIRED)    Answer:   left knee pain    Order Specific Question:   Preferred imaging location?    Answer:   [facility]  No orders of the defined types were placed in this encounter.    Discussed warning signs or symptoms. Please see discharge instructions. Patient expresses understanding.   The above documentation has been reviewed and is accurate and complete [name] [name], M.D.

## 2023-05-23 ENCOUNTER — Other Ambulatory Visit: Payer: Self-pay

## 2023-05-23 ENCOUNTER — Ambulatory Visit (INDEPENDENT_AMBULATORY_CARE_PROVIDER_SITE_OTHER): Payer: Medicare HMO

## 2023-05-23 ENCOUNTER — Ambulatory Visit: Payer: Medicare HMO | Admitting: Family Medicine

## 2023-05-23 VITALS — BP 128/80 | HR 74 | Ht 69.0 in | Wt 238.0 lb

## 2023-05-23 DIAGNOSIS — M25562 Pain in left knee: Secondary | ICD-10-CM

## 2023-05-23 DIAGNOSIS — G8929 Other chronic pain: Secondary | ICD-10-CM | POA: Diagnosis not present

## 2023-05-23 DIAGNOSIS — M25462 Effusion, left knee: Secondary | ICD-10-CM | POA: Diagnosis not present

## 2023-05-23 NOTE — Patient Instructions (Addendum)
Thank you for coming in today.   Please use Voltaren gel (Generic Diclofenac Gel) up to 4x daily for pain as needed.  This is available over-the-counter as both the name brand Voltaren gel and the generic diclofenac gel.   Tylenol Arthritis is your safest option.  Naproxen (Aleve) as needed  You received an injection today. Seek immediate medical attention if the joint becomes red, extremely painful, or is oozing fluid.

## 2023-05-24 ENCOUNTER — Encounter: Payer: Self-pay | Admitting: Family Medicine

## 2023-06-01 ENCOUNTER — Other Ambulatory Visit: Payer: Self-pay

## 2023-06-01 DIAGNOSIS — G8929 Other chronic pain: Secondary | ICD-10-CM

## 2023-06-01 DIAGNOSIS — M8480 Other disorders of continuity of bone, unspecified site: Secondary | ICD-10-CM

## 2023-06-01 NOTE — Progress Notes (Signed)
Left knee x-ray shows some arthritis. 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  They do not think it needs further evaluation however if you would like to we could get an MRI to look at it more accurately.

## 2023-06-05 ENCOUNTER — Encounter: Payer: Self-pay | Admitting: Family Medicine

## 2023-06-05 NOTE — Telephone Encounter (Signed)
Theodoro Grist called to f/u on this question, because he was confused when MRI scheduled called to schedule. Clarified that the knee MRI that was already ordered would include the area of his proximal tibial where the lesion was seen. He verbalized understanding and expressed appreciation.

## 2023-06-07 ENCOUNTER — Other Ambulatory Visit: Payer: Self-pay | Admitting: Adult Health

## 2023-06-07 DIAGNOSIS — G43109 Migraine with aura, not intractable, without status migrainosus: Secondary | ICD-10-CM

## 2023-06-07 NOTE — Telephone Encounter (Signed)
Last seen on 11/24/22 Follow up scheduled on 12/04/23

## 2023-06-12 ENCOUNTER — Ambulatory Visit (INDEPENDENT_AMBULATORY_CARE_PROVIDER_SITE_OTHER): Payer: Medicare HMO | Admitting: Family Medicine

## 2023-06-12 ENCOUNTER — Encounter: Payer: Self-pay | Admitting: Family Medicine

## 2023-06-12 ENCOUNTER — Ambulatory Visit (INDEPENDENT_AMBULATORY_CARE_PROVIDER_SITE_OTHER): Payer: Medicare HMO

## 2023-06-12 VITALS — BP 124/80 | HR 85 | Temp 97.9°F | Resp 16 | Ht 69.0 in

## 2023-06-12 DIAGNOSIS — S83232A Complex tear of medial meniscus, current injury, left knee, initial encounter: Secondary | ICD-10-CM | POA: Diagnosis not present

## 2023-06-12 DIAGNOSIS — G8929 Other chronic pain: Secondary | ICD-10-CM | POA: Diagnosis not present

## 2023-06-12 DIAGNOSIS — J069 Acute upper respiratory infection, unspecified: Secondary | ICD-10-CM | POA: Diagnosis not present

## 2023-06-12 DIAGNOSIS — M8480 Other disorders of continuity of bone, unspecified site: Secondary | ICD-10-CM

## 2023-06-12 DIAGNOSIS — R058 Other specified cough: Secondary | ICD-10-CM | POA: Diagnosis not present

## 2023-06-12 DIAGNOSIS — M25562 Pain in left knee: Secondary | ICD-10-CM

## 2023-06-12 DIAGNOSIS — M7122 Synovial cyst of popliteal space [Baker], left knee: Secondary | ICD-10-CM | POA: Diagnosis not present

## 2023-06-12 DIAGNOSIS — B951 Streptococcus, group B, as the cause of diseases classified elsewhere: Secondary | ICD-10-CM | POA: Diagnosis not present

## 2023-06-12 DIAGNOSIS — M1712 Unilateral primary osteoarthritis, left knee: Secondary | ICD-10-CM | POA: Diagnosis not present

## 2023-06-12 DIAGNOSIS — M25462 Effusion, left knee: Secondary | ICD-10-CM | POA: Diagnosis not present

## 2023-06-12 LAB — POC COVID19 BINAXNOW: SARS Coronavirus 2 Ag: NEGATIVE

## 2023-06-12 LAB — POCT RAPID STREP A (OFFICE): Rapid Strep A Screen: POSITIVE — AB

## 2023-06-12 LAB — POCT INFLUENZA A/B
Influenza A, POC: NEGATIVE
Influenza B, POC: NEGATIVE

## 2023-06-12 MED ORDER — CEPHALEXIN 500 MG PO CAPS
500.0000 mg | ORAL_CAPSULE | Freq: Two times a day (BID) | ORAL | 0 refills | Status: AC
Start: 2023-06-12 — End: 2023-06-22

## 2023-06-12 MED ORDER — BENZONATATE 100 MG PO CAPS: 200.0000 mg | ORAL_CAPSULE | Freq: Two times a day (BID) | ORAL | 0 refills | Status: AC | PRN

## 2023-06-12 NOTE — Progress Notes (Signed)
ACUTE VISIT Chief Complaint  Patient presents with   Cough   HPI: [name] [name] is a 72 y.o. male  with PMHx significant for HTN, DM2, HLD, migraine headaches,Vitamin D Deficiency, who is here today complaining of a couple days of nasal congestion, "bad"cough, headache.  Cough This is a new problem. The current episode started in the past 7 days. The problem has been unchanged. The cough is Non-productive. Associated symptoms include ear congestion, nasal congestion, postnasal drip and rhinorrhea. Pertinent negatives include no chest pain, chills, ear pain, eye redness, fever, heartburn, hemoptysis, myalgias, rash, sore throat, shortness of breath, weight loss or wheezing. Nothing aggravates the symptoms. He has tried nothing for the symptoms. His past medical history is significant for environmental allergies. There is no history of asthma or COPD.   He states that he gets "near-pneumonia" around October every year. He is unsure if he has been exposed to anyone sick, although he was at a conference on Saturday where he was wearing a mask. He states he has not travelled recently.  He has been taking zicam, but reports it has not helped.  He states he has never smoked.   Review of Systems  Constitutional:  Positive for fatigue. Negative for activity change, appetite change, chills, fever and weight loss.  HENT:  Positive for congestion, postnasal drip and rhinorrhea. Negative for ear pain and sore throat.   Eyes:  Negative for discharge and redness.  Respiratory:  Positive for cough. Negative for hemoptysis, shortness of breath and wheezing.   Cardiovascular:  Negative for chest pain.  Gastrointestinal:  Negative for abdominal pain, diarrhea, heartburn, nausea and vomiting.  Genitourinary:  Negative for decreased urine volume and hematuria.  Musculoskeletal:  Negative for myalgias.  Skin:  Negative for rash.  Allergic/Immunologic: Positive for environmental allergies.   Neurological:  Negative for syncope, facial asymmetry and weakness.  See other pertinent positives and negatives in HPI.  Current Outpatient Medications on File Prior to Visit  Medication Sig Dispense Refill   acetaminophen (TYLENOL) 500 MG tablet Take 2 tablets (1,000 mg total) by mouth every 6 (six) hours as needed for mild pain or moderate pain. 60 tablet 1   allopurinol (ZYLOPRIM) 100 MG tablet TAKE 1 TABLET EVERY DAY 90 tablet 3   amitriptyline (ELAVIL) 100 MG tablet TAKE 1 TABLET AT BEDTIME 90 tablet 3   DULoxetine (CYMBALTA) 60 MG capsule TAKE 1 CAPSULE EVERY DAY 90 capsule 2   EPINEPHRINE 0.3 mg/0.3 mL IJ SOAJ injection INJECT 0.3 MLS INTO THE MUSCLE ONCE 2 each 0   fluticasone (FLONASE) 50 MCG/ACT nasal spray Place 1 spray into both nostrils daily. 16 g 1   Fremanezumab-vfrm (AJOVY) 225 MG/1.5ML SOAJ Inject 225 mg into the skin every 30 (thirty) days. 4.5 mL 3   gabapentin (NEURONTIN) 300 MG capsule TAKE 1 CAPSULE AT BEDTIME 90 capsule 3   naproxen (NAPROSYN) 500 MG tablet Take 1 tablet (500 mg total) by mouth 3 (three) times daily with meals. 30 tablet 0   NON FORMULARY Pt uses a cpap nightly     ondansetron (ZOFRAN-ODT) 4 MG disintegrating tablet 4mg  ODT q4 hours prn nausea/vomit 6 tablet 0   pravastatin (PRAVACHOL) 40 MG tablet TAKE 1 TABLET EVERY DAY 90 tablet 3   Psyllium (METAMUCIL PO) Take 2 tablets by mouth 2 (two) times daily.     Semaglutide,0.25 or 0.5MG /DOS, (OZEMPIC, 0.25 OR 0.5 MG/DOSE,) 2 MG/1.5ML SOPN Inject 0.5 mg into the skin once a week. 4.5 mL 2  SUMAtriptan (IMITREX) 100 MG tablet TAKE 1 TABLET AS NEEDED FOR MIGRAINE. MAY REPEAT IN 2 HOURS IF HEADACHE PERSISTS OR RECURS. MAX 2 TABLETS IN 24 HOURS 27 tablet 1   tamsulosin (FLOMAX) 0.4 MG CAPS capsule Take 1 capsule (0.4 mg total) by mouth daily. 14 capsule 0   tiZANidine (ZANAFLEX) 4 MG tablet TAKE 1 TABLET THREE TIMES DAILY AS NEEDED FOR MUSCLE SPASM(S) 270 tablet 0   valACYclovir (VALTREX) 500 MG tablet Take  500 mg by mouth daily as needed (outbreaks).     Current Facility-Administered Medications on File Prior to Visit  Medication Dose Route Frequency Provider Last Rate Last Admin   0.9 %  sodium chloride infusion  500 mL Intravenous Once Meryl Dare, MD        Past Medical History:  Diagnosis Date   ALLERGIC RHINITIS 04/22/2007   Allergy    Cancer (HCC)    HX SKIN CANCER   Complication of anesthesia    more concerned about positioning of head & neck because of TMJ   DDD (degenerative disc disease), cervical    GERD (gastroesophageal reflux disease)    infrequently   Headache(784.0)    migraines   History of kidney stones    History of skin cancer    History of TMJ disorder    HYPERCHOLESTEROLEMIA 04/22/2007   HYPERLIPIDEMIA 07/29/2007   Hyperplastic colon polyp    NEPHROLITHIASIS, HX OF 07/29/2007   Osteoarth NOS-Unspec 04/22/2007   Pneumonia    Shortness of breath    Shoulder pain, acute    bilateral   Allergies  Allergen Reactions   Shrimp [Shellfish Allergy] Anaphylaxis and Rash   Codeine Rash and Other (See Comments)    Blacked out   Contrast Media [Iodinated Contrast Media] Rash   Diazepam Rash    Rash over whole body from neck down, and he lost his voice (airway compromise?)   Amoxicillin Other (See Comments)    Unknown    Benadryl [Diphenhydramine]     With dyes    Depakote [Divalproex Sodium] Swelling    Per pts report (given by Dr. Lesia Hausen).  Has throat swelling and rash.   Erythromycin Other (See Comments)    unknown   Flexeril [Cyclobenzaprine]    Allegra [Fexofenadine] Rash        Betadine [Povidone Iodine] Rash   Celebrex [Celecoxib] Rash   Meperidine And Related Rash   Methocarbamol Rash   Neosporin [Neomycin-Bacitracin Zn-Polymyx] Rash   Penicillins Rash   Plavix [Clopidogrel] Rash   Rivaroxaban Rash   Vioxx [Rofecoxib] Rash    Social History   Socioeconomic History   Marital status: Married    Spouse name: Not on file   Number  of children: 1   Years of education: 16+   Highest education level: Bachelor's degree (e.g., BA, AB, BS)  Occupational History   Occupation: Retired  Tobacco Use   Smoking status: Never   Smokeless tobacco: Never  Vaping Use   Vaping status: Never Used  Substance and Sexual Activity   Alcohol use: No    Alcohol/week: 0.0 standard drinks of alcohol   Drug use: No   Sexual activity: Yes    Partners: Female    Birth control/protection: None  Other Topics Concern   Not on file  Social History Narrative   Lives at home w/ his wife   Right-handed   Caffeine: 2 cups of coffee each morning   Social Determinants of Health   Financial Resource Strain: Low Risk  (  09/05/2022)   Overall Financial Resource Strain (CARDIA)    Difficulty of Paying Living Expenses: Not hard at all  Food Insecurity: No Food Insecurity (09/05/2022)   Hunger Vital Sign    Worried About Running Out of Food in the Last Year: Never true    Ran Out of Food in the Last Year: Never true  Transportation Needs: No Transportation Needs (09/05/2022)   PRAPARE - Administrator, Civil Service (Medical): No    Lack of Transportation (Non-Medical): No  Physical Activity: Sufficiently Active (09/05/2022)   Exercise Vital Sign    Days of Exercise per Week: 7 days    Minutes of Exercise per Session: 90 min  Stress: No Stress Concern Present (09/05/2022)   [employer] of Occupational Health - Occupational Stress Questionnaire    Feeling of Stress : Not at all  Social Connections: Socially Integrated (09/05/2022)   Social Connection and Isolation Panel [NHANES]    Frequency of Communication with Friends and Family: More than three times a week    Frequency of Social Gatherings with Friends and Family: More than three times a week    Attends Religious Services: More than 4 times per year    Active Member of [employer] or Organizations: Yes    Attends Banker Meetings: More than 4 times per year     Marital Status: Married   Vitals:   06/12/23 1159  BP: 124/80  Pulse: 85  Resp: 16  Temp: 97.9 F (36.6 C)  SpO2: 98%   Body mass index is 35.15 kg/m.  Physical Exam Vitals and nursing note reviewed.  Constitutional:      General: He is not in acute distress.    Appearance: He is well-developed. He is not ill-appearing.  HENT:     Head: Normocephalic and atraumatic.     Right Ear: External ear normal. Tympanic membrane is not erythematous.     Left Ear: External ear normal. Tympanic membrane is not erythematous.     Ears:     Comments: Cerumen excess in both ear canals, TM seen partially.    Nose: Congestion and rhinorrhea present.     Right Turbinates: Not enlarged.     Left Turbinates: Not enlarged.     Right Sinus: No maxillary sinus tenderness or frontal sinus tenderness.     Left Sinus: No maxillary sinus tenderness or frontal sinus tenderness.     Mouth/Throat:     Mouth: Oropharynx is clear and moist and mucous membranes are normal. Mucous membranes are moist.     Pharynx: Oropharynx is clear. Postnasal drip present. No oropharyngeal exudate or posterior oropharyngeal erythema.  Eyes:     Extraocular Movements: EOM normal.     Conjunctiva/sclera: Conjunctivae normal.  Cardiovascular:     Rate and Rhythm: Normal rate and regular rhythm.     Heart sounds: No murmur heard. Pulmonary:     Effort: Pulmonary effort is normal. No respiratory distress.     Breath sounds: Normal breath sounds. No stridor.  Lymphadenopathy:     Head:     Right side of head: No submandibular adenopathy.     Left side of head: No submandibular adenopathy.     Cervical: No cervical adenopathy.  Skin:    General: Skin is warm.     Findings: No erythema or rash.  Neurological:     Mental Status: He is alert and oriented to person, place, and time.  Psychiatric:  Mood and Affect: Mood and affect normal.   ASSESSMENT AND PLAN:  Mr. Dalomba was seen today for cough and congestion.    URI, acute Symptoms suggests a viral etiology, I explained patient that symptomatic treatment is usually recommended in this case. Rapid flu and COVID 19 here in the office were negative.  He can repeat COVID-19 test at home tomorrow if he is not feeling any better. Instructed to monitor for signs of complications, including new onset of fever among some. Nasal saline irrigations and adequate hydration. Instructed about warning signs. I also explained that cough and nasal congestion can last a few days and sometimes weeks. F/U as needed.  Other cough Symptomatic treatment with adequate hydration and benzonatate. He reports allergy to Mucinex DM, no serious rash, he is willing to try plain mucinex.  -     POCT rapid strep A -     POC COVID-19 BinaxNow -     POCT Influenza A/B -     Benzonatate; Take 2 capsules (200 mg total) by mouth 2 (two) times daily as needed for up to 10 days.  Dispense: 30 capsule; Refill: 0  Positive testing for group B Streptococcus He is not having sore throat and examination do not suggest strep pharyngitis. Rapid strep was done before patient was evaluated as part of protocol for respiratory symptoms. He has multiple abx allergies. Cephalexin recommended.  -     Cephalexin; Take 1 capsule (500 mg total) by mouth 2 (two) times daily for 10 days.  Dispense: 20 capsule; Refill: 0  Return if symptoms worsen or fail to improve.  I,Rachel Rivera,acting as a scribe for Calley Drenning [place], MD.,have documented all relevant documentation on the behalf of Yeraldine Forney [place], MD,as directed by  Lindley Hiney [place], MD while in the presence of Addysin Porco [place], MD.  I, Tritia Endo [place], MD, have reviewed all documentation for this visit. The documentation on 06/12/23 for the exam, diagnosis, procedures, and orders are all accurate and complete.  Brynli Ollis G. [place], MD  [facility]. [place] office.  Discharge Instructions   None

## 2023-06-12 NOTE — Patient Instructions (Addendum)
A few things to remember from today's visit:  Other cough - Plan: POC Rapid Strep A, POC COVID-19, POCT Influenza A/B  Positive testing for group B Streptococcus - Plan: cephALEXin (KEFLEX) 500 MG capsule  URI, acute viral infections are self-limited and we treat each symptom depending of severity.  Over the counter medications as decongestants and cold medications usually help, they need to be taken with caution if there is a history of high blood pressure or palpitations. Tylenol and/or Ibuprofen also helps with most symptoms (headache, muscle aching, fever,etc) Plenty of fluids. Honey helps with cough. Steam inhalations helps with runny nose, nasal congestion, and may prevent sinus infections. Cough and nasal congestion could last a few days and sometimes weeks. Please follow in not any better in 1-2 weeks or if symptoms get worse.  Do not use My Chart to request refills or for acute issues that need immediate attention. If you send a my chart message, it may take a few days to be addressed, specially if I am not in the office.  Please be sure medication list is accurate. If a new problem present, please set up appointment sooner than planned today.

## 2023-06-15 ENCOUNTER — Encounter: Payer: Self-pay | Admitting: Family Medicine

## 2023-06-19 DIAGNOSIS — D045 Carcinoma in situ of skin of trunk: Secondary | ICD-10-CM | POA: Diagnosis not present

## 2023-06-19 NOTE — Telephone Encounter (Signed)
Called the read room, they ran into an issue with the images but this has now been corrected.   Called pt and advised. Hopeful to get results tomorrow.

## 2023-06-20 ENCOUNTER — Other Ambulatory Visit: Payer: Self-pay

## 2023-06-20 ENCOUNTER — Ambulatory Visit: Payer: Medicare HMO | Admitting: Family Medicine

## 2023-06-20 DIAGNOSIS — G8929 Other chronic pain: Secondary | ICD-10-CM

## 2023-06-20 NOTE — Progress Notes (Signed)
MRI of the knee is significant for a large pothole of full-thickness cartilage loss in the weightbearing surface of the knee.  Your x-ray only showed mild arthritis but you have a large area of significant arthritis.  Additionally there is a torn meniscus and swelling within the knee and behind the knee.  The proximal tibial bone lesion seen on x-ray looks like benign fibro-osseous lesion and does not look like cancer or require any further evaluation based on the appearance on MRI.  We will work on authorization of gel injections which may be helpful.  However I fear that you are going to be moving towards knee replacement before too long. You should hear from my office shortly about authorization of gel injections.

## 2023-06-20 NOTE — Telephone Encounter (Signed)
Spoke with patient and reviewed results by phone.

## 2023-06-20 NOTE — Progress Notes (Signed)
Spoke with patient regarding results for MRI of the knee.   Pt declines visco/gel shot at this time and would like to be referred back to Dr. Despina Hick at [facility]. Dr. Despina Hick did his previous surgery for his other knee and he was very satisfied with the outcome. I does not want to see anyone other than Dr. Despina Hick.   Referral has been placed. Pt will contact the office if he has not heard from Ortho within a week or so about scheduling.

## 2023-06-21 ENCOUNTER — Encounter: Payer: Self-pay | Admitting: Family Medicine

## 2023-06-21 NOTE — Telephone Encounter (Signed)
Faxed referral

## 2023-06-28 ENCOUNTER — Telehealth: Payer: Self-pay

## 2023-06-28 NOTE — Telephone Encounter (Signed)
VOB initiated for Zilretta for LEFT knee OA 

## 2023-06-28 NOTE — Telephone Encounter (Signed)
Holding for Zilretta approval/denial.

## 2023-06-28 NOTE — Telephone Encounter (Signed)
-----   Message from [name] [name] sent at 06/20/2023  6:26 AM EDT ----- Regarding: Gel injections and Zilretta injections Please authorize left knee gel injections and Zilretta injections.

## 2023-06-28 NOTE — Telephone Encounter (Addendum)
ORTHOVISC for LEFT knee OA  (Also checking Zilretta)  Primary Insurance: [employer] Adv HMO-POS Co-pay: $10 Co-insurance: not provided/undetermined  Deductible: does not apply Prior Auth: NOT required   Knee Injection History 05/23/23 - Kenalog LEFT

## 2023-06-28 NOTE — Telephone Encounter (Signed)
VOB initiated for Orthovisc for LEFT knee OA.  

## 2023-06-28 NOTE — Telephone Encounter (Signed)
-----   Message from [name] sent at 06/20/2023  6:26 AM EDT ----- Regarding: Gel injections and Zilretta injections Please authorize left knee gel injections and Zilretta injections.

## 2023-06-29 DIAGNOSIS — M1712 Unilateral primary osteoarthritis, left knee: Secondary | ICD-10-CM | POA: Diagnosis not present

## 2023-06-29 NOTE — Telephone Encounter (Signed)
Prior Auth REQUIRED for [name] [name]

## 2023-07-02 ENCOUNTER — Ambulatory Visit (INDEPENDENT_AMBULATORY_CARE_PROVIDER_SITE_OTHER): Payer: Medicare HMO | Admitting: Family Medicine

## 2023-07-02 ENCOUNTER — Encounter: Payer: Self-pay | Admitting: Family Medicine

## 2023-07-02 VITALS — BP 120/76 | HR 58 | Temp 97.9°F | Ht 69.0 in | Wt 244.8 lb

## 2023-07-02 DIAGNOSIS — E1169 Type 2 diabetes mellitus with other specified complication: Secondary | ICD-10-CM | POA: Diagnosis not present

## 2023-07-02 DIAGNOSIS — E66812 Obesity, class 2: Secondary | ICD-10-CM | POA: Diagnosis not present

## 2023-07-02 DIAGNOSIS — Z23 Encounter for immunization: Secondary | ICD-10-CM

## 2023-07-02 DIAGNOSIS — G8929 Other chronic pain: Secondary | ICD-10-CM

## 2023-07-02 DIAGNOSIS — C4492 Squamous cell carcinoma of skin, unspecified: Secondary | ICD-10-CM | POA: Diagnosis not present

## 2023-07-02 DIAGNOSIS — R82998 Other abnormal findings in urine: Secondary | ICD-10-CM

## 2023-07-02 DIAGNOSIS — M25562 Pain in left knee: Secondary | ICD-10-CM | POA: Diagnosis not present

## 2023-07-02 DIAGNOSIS — M1712 Unilateral primary osteoarthritis, left knee: Secondary | ICD-10-CM

## 2023-07-02 DIAGNOSIS — Z6836 Body mass index (BMI) 36.0-36.9, adult: Secondary | ICD-10-CM

## 2023-07-02 LAB — COMPREHENSIVE METABOLIC PANEL
ALT: 34 U/L (ref 0–53)
AST: 30 U/L (ref 0–37)
Albumin: 4.1 g/dL (ref 3.5–5.2)
Alkaline Phosphatase: 42 U/L (ref 39–117)
BUN: 19 mg/dL (ref 6–23)
CO2: 28 meq/L (ref 19–32)
Calcium: 9.1 mg/dL (ref 8.4–10.5)
Chloride: 103 meq/L (ref 96–112)
Creatinine, Ser: 1.17 mg/dL (ref 0.40–1.50)
GFR: 62.37 mL/min (ref >60.00)
Glucose, Bld: 95 mg/dL (ref 70–99)
Potassium: 4.7 meq/L (ref 3.5–5.1)
Sodium: 138 meq/L (ref 135–145)
Total Bilirubin: 0.3 mg/dL (ref 0.2–1.2)
Total Protein: 7.1 g/dL (ref 6.0–8.3)

## 2023-07-02 LAB — URINALYSIS, ROUTINE W REFLEX MICROSCOPIC
Bilirubin Urine: NEGATIVE
Hgb urine dipstick: NEGATIVE
Ketones, ur: NEGATIVE
Leukocytes,Ua: NEGATIVE
Nitrite: NEGATIVE
RBC / HPF: NONE SEEN (ref 0–?)
Specific Gravity, Urine: <=1.005 — AB (ref 1.000–1.030)
Total Protein, Urine: NEGATIVE
Urine Glucose: NEGATIVE
Urobilinogen, UA: 0.2 (ref 0.0–1.0)
WBC, UA: NONE SEEN (ref 0–?)
pH: 6 (ref 5.0–8.0)

## 2023-07-02 LAB — VITAMIN D 25 HYDROXY (VIT D DEFICIENCY, FRACTURES): VITD: 40.57 ng/mL (ref 30.00–100.00)

## 2023-07-02 MED ORDER — SEMAGLUTIDE (1 MG/DOSE) 4 MG/3ML ~~LOC~~ SOPN
1.0000 mg | PEN_INJECTOR | SUBCUTANEOUS | 3 refills | Status: DC
Start: 2023-07-02 — End: 2023-10-08

## 2023-07-02 NOTE — Patient Instructions (Addendum)
We will send your urine to the lab for evaluation.  Given your history of kidney stones you should set up follow-up with urology if you have not done so recently and especially if you start developing any pain.  I refill on Ozempic was sent to your pharmacy at the increased dose, 1 mg weekly.  We will have you follow-up in 1 month to see how things are going.  If you develop worsening nausea, vomiting, GI symptoms while taking the medication notify clinic.  Work on chair exercises and other ways to increase activity that put less strain on your knees.

## 2023-07-02 NOTE — Progress Notes (Signed)
Established Patient Office Visit   Subjective  Patient ID: [name] [name], male    DOB: 1951-09-20  Age: 72 y.o. MRN: [id number]  Chief Complaint  Patient presents with   Medical Management of Chronic Issues    Ozempic and Flu shot, Bubbly Urine, left knee pain     Patient is a 72 year old male seen for follow-up.  Pt previously seen at [facility], but had issues with paperwork being completed.  At last [facility] patient was advised to stop Ozempic due to nausea the day after injection and weight gain.  Patient continued medication.  Has continued to gain weight.  Still being mindful of portion size. Was walking several miles daily, but has been unable to do so 2/2 left knee pain.  L knee worse after trip to [place].  Difficulty going up stairs.  Seen by sports medicine Ortho.  MRI of the knee concerning for cartilage tear and arthritis.  Patient to have gel injections next week but will likely need TKR.  S/P R TKR.  Patient concerned he will gain even more weight after surgery if has to stop Ozempic.   Pt notes foamy urine x 1 month.  Denies flank or back  pain.  H/o renal calculi.  No upcoming Urology appts.  Pt had an abnormal skin lesion on left forearm removed by dermatology which was squamous cell cancer.  Patient notes having a another lesion removed from right upper chest, inferior clavicle which was found to be invasive squamous cell carcinoma.    Patient Active Problem List   Diagnosis Date Noted   Squamous cell cancer of skin of forearm, left 10/18/2022   Type 2 diabetes mellitus with other specified complication (HCC) 06/15/2022   Migraine 06/15/2022   Controlled type 2 diabetes mellitus (HCC) 05/11/2022   Hypertension 05/11/2022   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   History of replacement of both shoulder joints 05/06/2020   History of spinal fusion 05/06/2020   History of stress fracture 05/06/2020   Chronic migraine without aura, with intractable  migraine, so stated, with status migrainosus 12/02/2018   OSA (obstructive sleep apnea) 12/02/2018   Tinnitus 03/20/2018   Prediabetes 11/21/2017   Other fatigue 06/18/2017   Shortness of breath on exertion 06/18/2017   Other hyperlipidemia 06/18/2017   Vitamin D deficiency 06/18/2017   Hyperglycemia 06/18/2017   Class 1 obesity with serious comorbidity and body mass index (BMI) of 34.0 to 34.9 in adult 06/18/2017   Right lumbar radiculopathy 08/24/2014   Piriformis syndrome of right side 08/24/2014   S/P shoulder replacement 12/25/2013   Migraine headache 12/22/2013   OA (osteoarthritis) of knee 08/25/2013   HYPERLIPIDEMIA 07/29/2007   NEPHROLITHIASIS, HX OF 07/29/2007   HYPERCHOLESTEROLEMIA 04/22/2007   Allergic rhinitis 04/22/2007   Osteoarthritis 04/22/2007   Past Medical History:  Diagnosis Date   ALLERGIC RHINITIS 04/22/2007   Allergy    Cancer (HCC)    HX SKIN CANCER   Complication of anesthesia    more concerned about positioning of head & neck because of TMJ   DDD (degenerative disc disease), cervical    GERD (gastroesophageal reflux disease)    infrequently   Headache(784.0)    migraines   History of kidney stones    History of skin cancer    History of TMJ disorder    HYPERCHOLESTEROLEMIA 04/22/2007   HYPERLIPIDEMIA 07/29/2007   Hyperplastic colon polyp    NEPHROLITHIASIS, HX OF 07/29/2007   Osteoarth NOS-Unspec 04/22/2007   Pneumonia  Shortness of breath    Shoulder pain, acute    bilateral   Past Surgical History:  Procedure Laterality Date   APPENDECTOMY     BACK SURGERY  10/07/2015   COLONOSCOPY     10 yrs ago- 2 HPP    COLONOSCOPY  2019   CYSTOSCOPY/URETEROSCOPY/HOLMIUM LASER/STENT PLACEMENT Right 07/26/2022   Procedure: RIGHT URETEROSCOPY/HOLMIUM LASER/STENT PLACEMENT;  Surgeon: Despina Arias, MD;  Location: [facility];  Service: Urology;  Laterality: Right;  75 MINUTES NEEDED FOR CASE   JOINT REPLACEMENT     KNEE ARTHROSCOPY     right x2    SHOULDER SURGERY     right   skin cancer biopsy     L forehead   TOTAL KNEE ARTHROPLASTY Right 08/25/2013   Procedure: RIGHT TOTAL KNEE ARTHROPLASTY;  Surgeon: Loanne Drilling, MD;  Location: [facility];  Service: Orthopedics;  Laterality: Right;   TOTAL SHOULDER ARTHROPLASTY Left 12/25/2013   DR SUPPLE    TOTAL SHOULDER ARTHROPLASTY Left 12/25/2013   Procedure: LEFT TOTAL SHOULDER ARTHROPLASTY;  Surgeon: Senaida Lange, MD;  Location: [facility];  Service: Orthopedics;  Laterality: Left;   TOTAL SHOULDER ARTHROPLASTY Right 06/18/2014   DR SUPPLE   TOTAL SHOULDER ARTHROPLASTY Right 06/18/2014   Procedure: RIGHT TOTAL SHOULDER ARTHROPLASTY;  Surgeon: Senaida Lange, MD;  Location: [facility];  Service: Orthopedics;  Laterality: Right;   WISDOM TOOTH EXTRACTION     Social History   Tobacco Use   Smoking status: Never   Smokeless tobacco: Never  Vaping Use   Vaping status: Never Used  Substance Use Topics   Alcohol use: No    Alcohol/week: 0.0 standard drinks of alcohol   Drug use: No   Family History  Problem Relation Age of Onset   COPD Father        family hx   Emphysema Father    Aneurysm Mother    Stroke Mother    Sudden death Mother    Cancer Mother    Obesity Mother    Breast cancer Mother    Cancer Maternal Grandfather        lung   Colon cancer Neg Hx    Colon polyps Neg Hx    Allergies  Allergen Reactions   Shrimp [Shellfish Allergy] Anaphylaxis and Rash   Codeine Other (See Comments) and Rash    Blacked out   Contrast Media [Iodinated Contrast Media] Rash   Diazepam Rash    Rash over whole body from neck down, and he lost his voice (airway compromise?)   Amoxicillin Other (See Comments)    Unknown   Benadryl [Diphenhydramine]     With dyes    Cyclobenzaprine Other (See Comments)   Depakote [Divalproex Sodium] Swelling    Per pts report (given by Dr. Lesia Hausen).  Has throat swelling and rash.   Diphenhydramine Hcl Other (See Comments)    Other Reaction(s): with dye    Erythromycin Other (See Comments)    unknown   Meperidine Hcl Other (See Comments)   Penicillin G Other (See Comments)   Povidone-Iodine Other (See Comments)   Shrimp Flavor Other (See Comments)   Allegra [Fexofenadine] Rash        Betadine [Povidone Iodine] Rash   Celecoxib Rash and Other (See Comments)   Clopidogrel Rash and Other (See Comments)   Meperidine And Related Rash   Methocarbamol Rash and Other (See Comments)   Neosporin [Neomycin-Bacitracin Zn-Polymyx] Rash   Penicillins Rash   Rivaroxaban Rash and Other (  See Comments)   Rofecoxib Rash and Other (See Comments)      ROS Negative unless stated above    Objective:     BP 120/76 (BP Location: Left Arm, Patient Position: Sitting, Cuff Size: Large)   Pulse (!) 58   Temp 97.9 F (36.6 C) (Oral)   Ht 5\' 9"  (1.753 m)   Wt 244 lb 12.8 oz (111 kg)   SpO2 98%   BMI 36.15 kg/m  BP Readings from Last 3 Encounters:  07/02/23 120/76  06/12/23 124/80  05/23/23 128/80   Wt Readings from Last 3 Encounters:  07/02/23 244 lb 12.8 oz (111 kg)  05/23/23 238 lb (108 kg)  03/14/23 231 lb 6.4 oz (105 kg)      Physical Exam Constitutional:      Appearance: Normal appearance. He is obese.  HENT:     Head: Normocephalic and atraumatic.     Nose: Nose normal.     Mouth/Throat:     Mouth: Mucous membranes are moist.  Eyes:     Conjunctiva/sclera: Conjunctivae normal.  Cardiovascular:     Rate and Rhythm: Normal rate.     Pulses: Normal pulses.  Pulmonary:     Effort: Pulmonary effort is normal.  Skin:    General: Skin is warm and dry.     Comments: Violaceous discolored area on left anterior forearm.  Healing surgical incision of right upper chest.  Neurological:     Mental Status: He is alert and oriented to person, place, and time. Mental status is at baseline.     No results found for any visits on 07/02/23.    Assessment & Plan:  Foamy urine -Discussed possible causes of foamy appearing urine including  increased protein, nephrotic syndrome, etc.  Obtain UA with reflex.  Patient encouraged to set up follow-up appointment with urology given history of renal calculi. -     Urinalysis, Routine w reflex microscopic -     Comprehensive metabolic panel  Class 2 severe obesity with serious comorbidity and body mass index (BMI) of 36.0 to 36.9 in adult, unspecified obesity type (HCC) -Body mass index is 36.15 kg/m. -Given left knee pain patient unable to exercise as he did before.  Discussed alternative exercises such as chair exercises, water aerobics, etc. that hard low impact line knees.  Discussed the importance of lifestyle modifications.  Hesitant to increase Ozempic dose given reported GI symptoms after injection.  Reviewed r/b/a with patient.  Weight was down to 211 lbs on 05/2022 -     Semaglutide (1 MG/DOSE); Inject 1 mg as directed once a week.  Dispense: 9 mL; Refill: 3 -     VITAMIN D 25 Hydroxy (Vit-D Deficiency, Fractures)  Chronic pain of left knee -2/2 OA and cartilage derangement  Primary osteoarthritis of left knee -Continue follow-up with Dr. Antony Odea, [facility] for left knee arthritis and meniscal derangement causing pain.  Will likely need left TKR if Viscosupplementation ineffective.  Squamous cell skin cancer -Sunscreen, protective clothing, limiting sun exposure -Continue follow-up with Derm  Controlled type 2 diabetes mellitus with other specified complication, unspecified whether long term insulin use (HCC) -Well-controlled -Hemoglobin A1c 5.7% on 03/14/2023. -Continue lifestyle modifications -Patient encouraged to schedule diabetic eye exam -Diabetic foot exam due at next [facility] -Will increase Ozempic from 0.5 mg weekly to 1 mg weekly. -     Semaglutide (1 MG/DOSE); Inject 1 mg as directed once a week.  Dispense: 9 mL; Refill: 3 -     Comprehensive metabolic  panel  Need for influenza vaccination -     Flu Vaccine Trivalent High Dose (Fluad)   Return in about 4 weeks  (around 07/30/2023).   Deeann Saint, MD

## 2023-07-05 NOTE — Telephone Encounter (Signed)
Prior Authorization initiated for [facility] for LEFT knee OA via [email or web address] KEY: [id number]

## 2023-07-09 NOTE — Telephone Encounter (Signed)
Called and spoke with patient. He is planning on following up with Dr. Berton Lan for his knee. He is planning on surgery at some point but will try gel shots first. He appreciates that care that he received from Dr. Denyse Amass and feels that sticking with Dr. Berton Lan is the best option at this point as Dr. Denyse Amass is non-surgical.

## 2023-07-09 NOTE — Telephone Encounter (Addendum)
Prior Auth for Zilretta DENIED  Pt has been referred to Dr. Despina Hick for surgical consult. Dr. Despina Hick recommended gel shots.   Will reach out to pt to see how he would like to proceed.

## 2023-07-12 DIAGNOSIS — M1712 Unilateral primary osteoarthritis, left knee: Secondary | ICD-10-CM | POA: Diagnosis not present

## 2023-07-19 DIAGNOSIS — H2513 Age-related nuclear cataract, bilateral: Secondary | ICD-10-CM | POA: Diagnosis not present

## 2023-07-19 DIAGNOSIS — Z0100 Encounter for examination of eyes and vision without abnormal findings: Secondary | ICD-10-CM | POA: Diagnosis not present

## 2023-07-19 DIAGNOSIS — M1712 Unilateral primary osteoarthritis, left knee: Secondary | ICD-10-CM | POA: Diagnosis not present

## 2023-07-25 ENCOUNTER — Telehealth: Payer: Self-pay | Admitting: Adult Health

## 2023-07-25 MED ORDER — AJOVY 225 MG/1.5ML ~~LOC~~ SOAJ: 225.0000 mg | SUBCUTANEOUS | 1 refills | Status: DC

## 2023-07-25 NOTE — Telephone Encounter (Signed)
Ajovy prescription faxed to [facility]. [phone]. Received a receipt of confirmation.

## 2023-07-25 NOTE — Telephone Encounter (Signed)
Next visit: 12/04/23 From last office visit:   "Continue amitriptyline, gabapentin, Cymbalta, Ajovy and tizanidine. Okay to continue using sumatriptan for abortive therapy"  Rx printed and ready for signature.

## 2023-07-25 NOTE — Telephone Encounter (Signed)
LVM at 2:14pm [name] [name] [name] [name]) calling in regard to their Fremanezumab-vfrm (AJOVY) 225 MG/1.5ML SOAJ. Unfortunately patient left one of their pins outside of cooler and now need a new prescription. There's no refill  on file for th patient. Please give [name] a for the patient. Please give [name] a call back or fax [name]. Can call us at [phone] or can fax at [phone]

## 2023-07-26 DIAGNOSIS — M1712 Unilateral primary osteoarthritis, left knee: Secondary | ICD-10-CM | POA: Diagnosis not present

## 2023-08-10 DIAGNOSIS — M1712 Unilateral primary osteoarthritis, left knee: Secondary | ICD-10-CM | POA: Diagnosis not present

## 2023-08-20 ENCOUNTER — Other Ambulatory Visit: Payer: Self-pay | Admitting: Family Medicine

## 2023-08-31 ENCOUNTER — Other Ambulatory Visit: Payer: Self-pay | Admitting: Adult Health

## 2023-09-12 NOTE — H&P (Signed)
TOTAL KNEE ADMISSION H&P  Patient is being admitted for left total knee arthroplasty.  Subjective:  Chief Complaint: Left knee pain.  HPI: [name] [name], 72 y.o. male has a history of pain and functional disability in the left knee due to arthritis and has failed non-surgical conservative treatments for greater than 12 weeks to include NSAID's and/or analgesics, weight reduction as appropriate, and activity modification. Onset of symptoms was gradual, starting several years ago with gradually worsening course since that time. The patient noted no past surgery on the left knee.  Patient currently rates pain in the left knee at 9 out of 10 with activity. Patient has night pain, worsening of pain with activity and weight bearing, and pain that interferes with activities of daily living. Patient has evidence of  significant medial joint space narrowing in the left knee. The patient is not fully bone-on-bone. There is also patellofemoral narrowing  by imaging studies. There is no active infection.  Patient Active Problem List   Diagnosis Date Noted   Squamous cell cancer of skin of forearm, left 10/18/2022   Type 2 diabetes mellitus with other specified complication (HCC) 06/15/2022   Migraine 06/15/2022   Controlled type 2 diabetes mellitus (HCC) 05/11/2022   Hypertension 05/11/2022   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   History of replacement of both shoulder joints 05/06/2020   History of spinal fusion 05/06/2020   History of stress fracture 05/06/2020   Chronic migraine without aura, with intractable migraine, so stated, with status migrainosus 12/02/2018   OSA (obstructive sleep apnea) 12/02/2018   Tinnitus 03/20/2018   Prediabetes 11/21/2017   Other fatigue 06/18/2017   Shortness of breath on exertion 06/18/2017   Other hyperlipidemia 06/18/2017   Vitamin D deficiency 06/18/2017   Hyperglycemia 06/18/2017   Class 1 obesity with serious comorbidity and body mass  index (BMI) of 34.0 to 34.9 in adult 06/18/2017   Right lumbar radiculopathy 08/24/2014   Piriformis syndrome of right side 08/24/2014   S/P shoulder replacement 12/25/2013   Migraine headache 12/22/2013   OA (osteoarthritis) of knee 08/25/2013   HYPERLIPIDEMIA 07/29/2007   NEPHROLITHIASIS, HX OF 07/29/2007   HYPERCHOLESTEROLEMIA 04/22/2007   Allergic rhinitis 04/22/2007   Osteoarthritis 04/22/2007    Past Medical History:  Diagnosis Date   ALLERGIC RHINITIS 04/22/2007   Allergy    Cancer (HCC)    HX SKIN CANCER   Complication of anesthesia    more concerned about positioning of head & neck because of TMJ   DDD (degenerative disc disease), cervical    GERD (gastroesophageal reflux disease)    infrequently   Headache(784.0)    migraines   History of kidney stones    History of skin cancer    History of TMJ disorder    HYPERCHOLESTEROLEMIA 04/22/2007   HYPERLIPIDEMIA 07/29/2007   Hyperplastic colon polyp    NEPHROLITHIASIS, HX OF 07/29/2007   Osteoarth NOS-Unspec 04/22/2007   Pneumonia    Shortness of breath    Shoulder pain, acute    bilateral    Past Surgical History:  Procedure Laterality Date   APPENDECTOMY     BACK SURGERY  10/07/2015   COLONOSCOPY     10 yrs ago- 2 HPP    COLONOSCOPY  2019   CYSTOSCOPY/URETEROSCOPY/HOLMIUM LASER/STENT PLACEMENT Right 07/26/2022   Procedure: RIGHT URETEROSCOPY/HOLMIUM LASER/STENT PLACEMENT;  Surgeon: Despina Arias, MD;  Location: [facility];  Service: Urology;  Laterality: Right;  75 MINUTES NEEDED FOR CASE   JOINT REPLACEMENT  KNEE ARTHROSCOPY     right x2   SHOULDER SURGERY     right   skin cancer biopsy     L forehead   TOTAL KNEE ARTHROPLASTY Right 08/25/2013   Procedure: RIGHT TOTAL KNEE ARTHROPLASTY;  Surgeon: Loanne Drilling, MD;  Location: [facility];  Service: Orthopedics;  Laterality: Right;   TOTAL SHOULDER ARTHROPLASTY Left 12/25/2013   DR SUPPLE    TOTAL SHOULDER ARTHROPLASTY Left 12/25/2013   Procedure: LEFT  TOTAL SHOULDER ARTHROPLASTY;  Surgeon: Senaida Lange, MD;  Location: [facility];  Service: Orthopedics;  Laterality: Left;   TOTAL SHOULDER ARTHROPLASTY Right 06/18/2014   DR SUPPLE   TOTAL SHOULDER ARTHROPLASTY Right 06/18/2014   Procedure: RIGHT TOTAL SHOULDER ARTHROPLASTY;  Surgeon: Senaida Lange, MD;  Location: [facility];  Service: Orthopedics;  Laterality: Right;   WISDOM TOOTH EXTRACTION      Prior to Admission medications   Medication Sig Start Date End Date Taking? Authorizing Provider  acetaminophen (TYLENOL) 650 MG CR tablet Take 650-1,300 mg by mouth every 8 (eight) hours as needed for pain.   Yes [provider]  allopurinol (ZYLOPRIM) 100 MG tablet TAKE 1 TABLET EVERY DAY Patient taking differently: Take 100 mg by mouth at bedtime. 05/02/23  Yes Deeann Saint, MD  amitriptyline (ELAVIL) 100 MG tablet TAKE 1 TABLET AT BEDTIME 01/29/23  Yes Millikan, Aundra Millet, NP  DULoxetine (CYMBALTA) 60 MG capsule TAKE 1 CAPSULE EVERY DAY Patient taking differently: Take 60 mg by mouth at bedtime. 03/14/23  Yes Butch Penny, NP  EPINEPHRINE 0.3 mg/0.3 mL IJ SOAJ injection INJECT 0.3 MLS INTO THE MUSCLE ONCE 03/23/22  Yes Deeann Saint, MD  fluticasone (FLONASE) 50 MCG/ACT nasal spray Place 1 spray into both nostrils daily. Patient taking differently: Place 1 spray into both nostrils daily as needed for allergies. 09/29/22  Yes Deeann Saint, MD  Fremanezumab-vfrm (AJOVY) 225 MG/1.5ML SOAJ Inject 225 mg into the skin every 30 (thirty) days. 07/25/23  Yes Butch Penny, NP  gabapentin (NEURONTIN) 300 MG capsule TAKE 1 CAPSULE AT BEDTIME 06/07/23  Yes Millikan, Aundra Millet, NP  meloxicam (MOBIC) 15 MG tablet Take 15 mg by mouth in the morning. 06/29/23  Yes [provider]  naproxen (NAPROSYN) 500 MG tablet Take 1 tablet (500 mg total) by mouth 3 (three) times daily with meals. Patient taking differently: Take 500 mg by mouth 2 (two) times daily as needed (pain.). 07/26/22  Yes Despina Arias,  MD  pravastatin (PRAVACHOL) 40 MG tablet TAKE 1 TABLET EVERY DAY Patient taking differently: Take 40 mg by mouth at bedtime. 08/21/23  Yes Deeann Saint, MD  Psyllium (METAMUCIL PO) Take 2 capsules by mouth at bedtime.   Yes [provider]  Semaglutide, 1 MG/DOSE, 4 MG/3ML SOPN Inject 1 mg as directed once a week. Patient taking differently: Inject 1 mg as directed every Sunday. 07/02/23  Yes Deeann Saint, MD  SUMAtriptan (IMITREX) 100 MG tablet TAKE 1 TABLET AS NEEDED FOR MIGRAINE. MAY REPEAT IN 2 HOURS IF HEADACHE PERSISTS OR RECURS. MAX 2 TABLETS IN 24 HOURS 09/05/23  Yes Butch Penny, NP  tamsulosin (FLOMAX) 0.4 MG CAPS capsule Take 1 capsule (0.4 mg total) by mouth daily. Patient taking differently: Take 0.4 mg by mouth at bedtime. 07/26/22  Yes Despina Arias, MD  tiZANidine (ZANAFLEX) 4 MG tablet TAKE 1 TABLET THREE TIMES DAILY AS NEEDED FOR MUSCLE SPASM(S) Patient taking differently: Take 4 mg by mouth at bedtime. Take 1 tablet (4  mg) by mouth scheduled at bedtime, may take an additional dose up to 3 times daily for muscle spasms. 03/23/22  Yes Butch Penny, NP  NON FORMULARY Pt uses a cpap nightly    [provider]    Allergies  Allergen Reactions   Shrimp [Shellfish Allergy] Anaphylaxis and Rash   Codeine Other (See Comments) and Rash    Blacked out   Contrast Media [Iodinated Contrast Media] Rash   Diazepam Rash    Rash over whole body from neck down, and he lost his voice (airway compromise?)   Amoxicillin Other (See Comments)    Unknown   Benadryl [Diphenhydramine]     With dyes    Cyclobenzaprine Other (See Comments)   Depakote [Divalproex Sodium] Swelling    Per pts report (given by Dr. Lesia Hausen).  Has throat swelling and rash.   Diphenhydramine Hcl Other (See Comments)    Other Reaction(s): with dye   Erythromycin Other (See Comments)    unknown   Meperidine Hcl Other (See Comments)   Penicillin G Other (See Comments)    Povidone-Iodine Other (See Comments)   Shrimp Flavor Agent (Non-Screening) Other (See Comments)   Allegra [Fexofenadine] Rash        Betadine [Povidone Iodine] Rash   Celecoxib Rash and Other (See Comments)   Clopidogrel Rash and Other (See Comments)   Meperidine And Related Rash   Methocarbamol Rash and Other (See Comments)   Neosporin [Neomycin-Bacitracin Zn-Polymyx] Rash   Penicillins Rash   Rivaroxaban Rash and Other (See Comments)   Rofecoxib Rash and Other (See Comments)    Social History   Socioeconomic History   Marital status: Married    Spouse name: Not on file   Number of children: 1   Years of education: 16+   Highest education level: Bachelor's degree (e.g., BA, AB, BS)  Occupational History   Occupation: Retired  Tobacco Use   Smoking status: Never   Smokeless tobacco: Never  Vaping Use   Vaping status: Never Used  Substance and Sexual Activity   Alcohol use: No    Alcohol/week: 0.0 standard drinks of alcohol   Drug use: No   Sexual activity: Yes    Partners: Female    Birth control/protection: None  Other Topics Concern   Not on file  Social History Narrative   Lives at home w/ his wife   Right-handed   Caffeine: 2 cups of coffee each morning   Social Drivers of Corporate investment banker Strain: Low Risk  (06/30/2023)   Overall Financial Resource Strain (CARDIA)    Difficulty of Paying Living Expenses: Not hard at all  Food Insecurity: No Food Insecurity (06/30/2023)   Hunger Vital Sign    Worried About Running Out of Food in the Last Year: Never true    Ran Out of Food in the Last Year: Never true  Transportation Needs: No Transportation Needs (06/30/2023)   PRAPARE - Administrator, Civil Service (Medical): No    Lack of Transportation (Non-Medical): No  Physical Activity: Sufficiently Active (06/30/2023)   Exercise Vital Sign    Days of Exercise per Week: 5 days    Minutes of Exercise per Session: 60 min  Stress: No Stress  Concern Present (06/30/2023)   [employer] of Occupational Health - Occupational Stress Questionnaire    Feeling of Stress : Only a little  Social Connections: Moderately Integrated (06/30/2023)   Social Connection and Isolation Panel [NHANES]    Frequency of Communication  with Friends and Family: More than three times a week    Frequency of Social Gatherings with Friends and Family: Once a week    Attends Religious Services: 1 to 4 times per year    Active Member of [employer] or Organizations: No    Attends Engineer, structural: Not on file    Marital Status: Married  Catering manager Violence: Not At Risk (09/05/2022)   Humiliation, Afraid, Rape, and Kick questionnaire    Fear of Current or Ex-Partner: No    Emotionally Abused: No    Physically Abused: No    Sexually Abused: No    Tobacco Use: Low Risk  (07/02/2023)   Patient History    Smoking Tobacco Use: Never    Smokeless Tobacco Use: Never    Passive Exposure: Not on file   Social History   Substance and Sexual Activity  Alcohol Use No   Alcohol/week: 0.0 standard drinks of alcohol    Family History  Problem Relation Age of Onset   COPD Father        family hx   Emphysema Father    Aneurysm Mother    Stroke Mother    Sudden death Mother    Cancer Mother    Obesity Mother    Breast cancer Mother    Cancer Maternal Grandfather        lung   Colon cancer Neg Hx    Colon polyps Neg Hx     ROS  Objective:  Physical Exam: Well nourished and well developed.  General: Alert and oriented x3, cooperative and pleasant, no acute distress.  Head: normocephalic, atraumatic, neck supple.  Eyes: EOMI.  Respiratory: breath sounds clear in all fields, no wheezing, rales, or rhonchi. Cardiovascular: Regular rate and rhythm, no murmurs, gallops or rubs.  Abdomen: non-tender to palpation and soft, normoactive bowel sounds. Musculoskeletal: - Left knee shows a trace effusion.  - Range of motion is approximately  0-125 degrees.  - Tender medially.  - No lateral tenderness or instability noted. Calves soft and nontender. Motor function intact in LE. Strength 5/5 LE bilaterally. Neuro: Distal pulses 2+. Sensation to light touch intact in LE.  Vital signs in last 24 hours: BP: ()/()  Arterial Line BP: ()/()   Imaging Review Plain radiographs demonstrate moderate degenerative joint disease of the left knee. The overall alignment is neutral. The bone quality appears to be adequate for age and reported activity level.  Assessment/Plan:  End stage arthritis, left knee   The patient history, physical examination, clinical judgment of the provider and imaging studies are consistent with end stage degenerative joint disease of the left knee and total knee arthroplasty is deemed medically necessary. The treatment options including medical management, injection therapy arthroscopy and arthroplasty were discussed at length. The risks and benefits of total knee arthroplasty were presented and reviewed. The risks due to aseptic loosening, infection, stiffness, patella tracking problems, thromboembolic complications and other imponderables were discussed. The patient acknowledged the explanation, agreed to proceed with the plan and consent was signed. Patient is being admitted for inpatient treatment for surgery, pain control, PT, OT, prophylactic antibiotics, VTE prophylaxis, progressive ambulation and ADLs and discharge planning. The patient is planning to be discharged  home .  Patient's anticipated LOS is less than 2 midnights, meeting these requirements: - Lives within 1 hour of care - Has a competent adult at home to recover with post-op - NO history of  - Chronic pain requiring opiods  - Diabetes  -  Coronary Artery Disease  - Heart failure  - Heart attack  - Stroke  - DVT/VTE  - Cardiac arrhythmia  - Respiratory Failure/COPD  - Renal failure  - Anemia  - Advanced Liver disease  Therapy Plans: [facility] Disposition: Home with Wife Planned DVT Prophylaxis: Aspirin 81 mg BID DME Needed: None PCP: Abbe Amsterdam, MD (clearance received) TXA: IV Allergies: **multiple** - penicillins (rash), methocarbamol (rash), cyclobenzaprine (rash), iodine (rash), xarelto (rash), clopidogrel (rash) Anesthesia Concerns: None BMI: 36 Last HgbA1c: not diabetic  Pharmacy: [facility] ([address])  Other: -Discussed dilaudid and tizanidine post-op due to number of allergies  - Patient was instructed on what medications to stop prior to surgery. - Follow-up visit in 2 weeks with Dr. Lequita Halt - Begin physical therapy following surgery - Pre-operative lab work as pre-surgical testing - Prescriptions will be provided in hospital at time of discharge  R. Arcola Jansky, PA-C Orthopedic Surgery [facility] [place]

## 2023-09-20 NOTE — Patient Instructions (Signed)
SURGICAL WAITING ROOM VISITATION  Patients having surgery or a procedure may have no more than 2 support people in the waiting area - these visitors may rotate.    Children under the age of 72 must have an adult with them who is not the patient.  Due to an increase in RSV and influenza rates and associated hospitalizations, children ages 73 and under may not visit patients in [facility] hospitals.  If the patient needs to stay at the hospital during part of their recovery, the visitor guidelines for inpatient rooms apply. Pre-op nurse will coordinate an appropriate time for 1 support person to accompany patient in pre-op.  This support person may not rotate.    Please refer to the [facility] website for the visitor guidelines for Inpatients (after your surgery is over and you are in a regular room).    Your procedure is scheduled on: 10/01/23   Report to [facility] Main Entrance    Report to admitting at 5:15 AM   Call this number if you have problems the morning of surgery [phone]   Do not eat food :After Midnight.   After Midnight you may have the following liquids until 4:15 AM DAY OF SURGERY  Water Non-Citrus Juices (without pulp, NO RED-Apple, White grape, White cranberry) Black Coffee (NO MILK/CREAM OR CREAMERS, sugar ok)  Clear Tea (NO MILK/CREAM OR CREAMERS, sugar ok) regular and decaf                             Plain Jell-O (NO RED)                                           Fruit ices (not with fruit pulp, NO RED)                                     Popsicles (NO RED)                                                               Sports drinks like Gatorade (NO RED)               The day of surgery:  Drink ONE (1) Pre-Surgery G2 at 4:15 AM the morning of surgery. Drink in one sitting. Do not sip.  This drink was given to you during your hospital  pre-op appointment visit. Nothing else to drink after completing the  Pre-Surgery G2.          If you have  questions, please contact your surgeon's office.   FOLLOW BOWEL PREP AND ANY ADDITIONAL PRE OP INSTRUCTIONS YOU RECEIVED FROM YOUR SURGEON'S OFFICE!!!     Oral Hygiene is also important to reduce your risk of infection.                                    Remember - BRUSH YOUR TEETH THE MORNING OF SURGERY WITH YOUR REGULAR TOOTHPASTE  DENTURES WILL BE REMOVED PRIOR TO  SURGERY PLEASE DO NOT APPLY "Poly grip" OR ADHESIVES!!!   Do NOT smoke after Midnight   Stop all vitamins and herbal supplements 7 days before surgery.   Take these medicines the morning of surgery with A SIP OF WATER: Tylenol  DO NOT TAKE ANY ORAL DIABETIC MEDICATIONS DAY OF YOUR SURGERY  How to Manage Your Diabetes Before and After Surgery  Why is it important to control my blood sugar before and after surgery? Improving blood sugar levels before and after surgery helps healing and can limit problems. A way of improving blood sugar control is eating a healthy diet by:  Eating less sugar and carbohydrates  Increasing activity/exercise  Talking with your doctor about reaching your blood sugar goals High blood sugars (greater than 180 mg/dL) can raise your risk of infections and slow your recovery, so you will need to focus on controlling your diabetes during the weeks before surgery. Make sure that the doctor who takes care of your diabetes knows about your planned surgery including the date and location.  How do I manage my blood sugar before surgery? Check your blood sugar at least 4 times a day, starting 2 days before surgery, to make sure that the level is not too high or low. Check your blood sugar the morning of your surgery when you wake up and every 2 hours until you get to the Short Stay unit. If your blood sugar is less than 70 mg/dL, you will need to treat for low blood sugar: Do not take insulin. Treat a low blood sugar (less than 70 mg/dL) with  cup of clear juice (cranberry or apple), 4 glucose tablets,  OR glucose gel. Recheck blood sugar in 15 minutes after treatment (to make sure it is greater than 70 mg/dL). If your blood sugar is not greater than 70 mg/dL on recheck, call [phone] for further instructions. Report your blood sugar to the short stay nurse when you get to Short Stay.  If you are admitted to the hospital after surgery: Your blood sugar will be checked by the staff and you will probably be given insulin after surgery (instead of oral diabetes medicines) to make sure you have good blood sugar levels. The goal for blood sugar control after surgery is 80-180 mg/dL.   WHAT DO I DO ABOUT MY DIABETES MEDICATION?  Do not take oral diabetes medicines (pills) the morning of surgery.  THE DAY BEFORE SURGERY, take     units of       insulin.       THE MORNING OF SURGERY, take   units of         insulin.  DO NOT TAKE THE FOLLOWING 7 DAYS PRIOR TO SURGERY: Ozempic, Wegovy, Rybelsus (Semaglutide), Byetta (exenatide), Bydureon (exenatide ER), Victoza, Saxenda (liraglutide), or Trulicity (dulaglutide) Mounjaro (Tirzepatide) Adlyxin (Lixisenatide), Polyethylene Glycol Loxenatide.   Reviewed and Endorsed by [facility] Patient Education Committee, August 2015  Bring CPAP mask and tubing day of surgery.                              You may not have any metal on your body including jewelry, and body piercing             Do not wear lotions, powders, cologne, or deodorant              Men may shave face and neck.   Do not bring valuables to the  hospital. [facility] IS NOT             RESPONSIBLE   FOR VALUABLES.   Contacts, glasses, dentures or bridgework may not be worn into surgery.   Bring small overnight bag day of surgery.   DO NOT BRING YOUR HOME MEDICATIONS TO THE HOSPITAL. PHARMACY WILL DISPENSE MEDICATIONS LISTED ON YOUR MEDICATION LIST TO YOU DURING YOUR ADMISSION IN THE HOSPITAL!   Special Instructions: Bring a copy of your healthcare power of attorney and living  will documents the day of surgery if you haven't scanned them before.              Please read over the following fact sheets you were given: IF YOU HAVE QUESTIONS ABOUT YOUR PRE-OP INSTRUCTIONS PLEASE CALL [phone] Contras    If you received a COVID test during your pre-op visit  it is requested that you wear a mask when out in public, stay away from anyone that may not be feeling well and notify your surgeon if you develop symptoms. If you test positive for Covid or have been in contact with anyone that has tested positive in the last 10 days please notify you surgeon.      Pre-operative 5 CHG Bath Instructions   You can play a key role in reducing the risk of infection after surgery. Your skin needs to be as free of germs as possible. You can reduce the number of germs on your skin by washing with CHG (chlorhexidine gluconate) soap before surgery. CHG is an antiseptic soap that kills germs and continues to kill germs even after washing.   DO NOT use if you have an allergy to chlorhexidine/CHG or antibacterial soaps. If your skin becomes reddened or irritated, stop using the CHG and notify one of our RNs at [phone].   Please shower with the CHG soap starting 4 days before surgery using the following schedule:     Please keep in mind the following:  DO NOT shave, including legs and underarms, starting the day of your first shower.   You may shave your face at any point before/day of surgery.  Place clean sheets on your bed the day you start using CHG soap. Use a clean washcloth (not used since being washed) for each shower. DO NOT sleep with pets once you start using the CHG.   CHG Shower Instructions:  If you choose to wash your hair and private area, wash first with your normal shampoo/soap.  After you use shampoo/soap, rinse your hair and body thoroughly to remove shampoo/soap residue.  Turn the water OFF and apply about 3 tablespoons (45 ml) of CHG soap to a CLEAN  washcloth.  Apply CHG soap ONLY FROM YOUR NECK DOWN TO YOUR TOES (washing for 3-5 minutes)  DO NOT use CHG soap on face, private areas, open wounds, or sores.  Pay special attention to the area where your surgery is being performed.  If you are having back surgery, having someone wash your back for you may be helpful. Wait 2 minutes after CHG soap is applied, then you may rinse off the CHG soap.  Pat dry with a clean towel  Put on clean clothes/pajamas   If you choose to wear lotion, please use ONLY the CHG-compatible lotions on the back of this paper.     Additional instructions for the day of surgery: DO NOT APPLY any lotions, deodorants, cologne, or perfumes.   Put on clean/comfortable clothes.  Brush your teeth.  Ask  your nurse before applying any prescription medications to the skin.      CHG Compatible Lotions   Aveeno Moisturizing lotion  Cetaphil Moisturizing Cream  Cetaphil Moisturizing Lotion  Clairol Herbal Essence Moisturizing Lotion, Dry Skin  Clairol Herbal Essence Moisturizing Lotion, Extra Dry Skin  Clairol Herbal Essence Moisturizing Lotion, Normal Skin  Curel Age Defying Therapeutic Moisturizing Lotion with Alpha Hydroxy  Curel Extreme Care Body Lotion  Curel Soothing Hands Moisturizing Hand Lotion  Curel Therapeutic Moisturizing Cream, Fragrance-Free  Curel Therapeutic Moisturizing Lotion, Fragrance-Free  Curel Therapeutic Moisturizing Lotion, Original Formula  Eucerin Daily Replenishing Lotion  Eucerin Dry Skin Therapy Plus Alpha Hydroxy Crme  Eucerin Dry Skin Therapy Plus Alpha Hydroxy Lotion  Eucerin Original Crme  Eucerin Original Lotion  Eucerin Plus Crme Eucerin Plus Lotion  Eucerin TriLipid Replenishing Lotion  Keri Anti-Bacterial Hand Lotion  Keri Deep Conditioning Original Lotion Dry Skin Formula Softly Scented  Keri Deep Conditioning Original Lotion, Fragrance Free Sensitive Skin Formula  Keri Lotion Fast Absorbing Fragrance Free Sensitive  Skin Formula  Keri Lotion Fast Absorbing Softly Scented Dry Skin Formula  Keri Original Lotion  Keri Skin Renewal Lotion Keri Silky Smooth Lotion  Keri Silky Smooth Sensitive Skin Lotion  Nivea Body Creamy Conditioning Oil  Nivea Body Extra Enriched Lotion  Nivea Body Original Lotion  Nivea Body Sheer Moisturizing Lotion Nivea Crme  Nivea Skin Firming Lotion  NutraDerm 30 Skin Lotion  NutraDerm Skin Lotion  NutraDerm Therapeutic Skin Cream  NutraDerm Therapeutic Skin Lotion  ProShield Protective Hand Cream  Provon moisturizing lotion   Incentive Spirometer  An incentive spirometer is a tool that can help keep your lungs clear and active. This tool measures how well you are filling your lungs with each breath. Taking long deep breaths may help reverse or decrease the chance of developing breathing (pulmonary) problems (especially infection) following: A long period of time when you are unable to move or be active. BEFORE THE PROCEDURE  If the spirometer includes an indicator to show your best effort, your nurse or respiratory therapist will set it to a desired goal. If possible, sit up straight or lean slightly forward. Try not to slouch. Hold the incentive spirometer in an upright position. INSTRUCTIONS FOR USE  Sit on the edge of your bed if possible, or sit up as far as you can in bed or on a chair. Hold the incentive spirometer in an upright position. Breathe out normally. Place the mouthpiece in your mouth and seal your lips tightly around it. Breathe in slowly and as deeply as possible, raising the piston or the ball toward the top of the column. Hold your breath for 3-5 seconds or for as long as possible. Allow the piston or ball to fall to the bottom of the column. Remove the mouthpiece from your mouth and breathe out normally. Rest for a few seconds and repeat Steps 1 through 7 at least 10 times every 1-2 hours when you are awake. Take your time and take a few normal breaths  between deep breaths. The spirometer may include an indicator to show your best effort. Use the indicator as a goal to work toward during each repetition. After each set of 10 deep breaths, practice coughing to be sure your lungs are clear. If you have an incision (the cut made at the time of surgery), support your incision when coughing by placing a pillow or rolled up towels firmly against it. Once you are able to get out of bed, walk  around indoors and cough well. You may stop using the incentive spirometer when instructed by your caregiver.  RISKS AND COMPLICATIONS Take your time so you do not get dizzy or light-headed. If you are in pain, you may need to take or ask for pain medication before doing incentive spirometry. It is harder to take a deep breath if you are having pain. AFTER USE Rest and breathe slowly and easily. It can be helpful to keep track of a log of your progress. Your caregiver can provide you with a simple table to help with this. If you are using the spirometer at home, follow these instructions: SEEK MEDICAL CARE IF:  You are having difficultly using the spirometer. You have trouble using the spirometer as often as instructed. Your pain medication is not giving enough relief while using the spirometer. You develop fever of 100.5 F (38.1 C) or higher. SEEK IMMEDIATE MEDICAL CARE IF:  You cough up bloody sputum that had not been present before. You develop fever of 102 F (38.9 C) or greater. You develop worsening pain at or near the incision site. MAKE SURE YOU:  Understand these instructions. Will watch your condition. Will get help right away if you are not doing well or get worse. Document Released: 01/22/2007 Document Revised: 12/04/2011 Document Reviewed: 03/25/2007 [facility] Patient Information 2014 [place], [place].   ________________________________________________________________________

## 2023-09-20 NOTE — Progress Notes (Signed)
COVID Vaccine Completed: yes  Date of COVID positive in last 90 days:  PCP - Abbe Amsterdam, MD Cardiologist -   Chest x-ray -  EKG -  Stress Test -  ECHO -  Cardiac Cath -  Pacemaker/ICD device last checked: Spinal Cord Stimulator:  Bowel Prep -   Sleep Study -  CPAP -   Fasting Blood Sugar -  Checks Blood Sugar _____ times a day  Last dose of GLP1 agonist-  Semaglutide every Sunday GLP1 instructions:  Hold 7 days before surgery    Last dose of SGLT-2 inhibitors-  N/A SGLT-2 instructions:  Hold 3 days before surgery    Blood Thinner Instructions:  Time Aspirin Instructions: Last Dose:  Activity level:  Can go up a flight of stairs and perform activities of daily living without stopping and without symptoms of chest pain or shortness of breath.  Able to exercise without symptoms  Unable to go up a flight of stairs without symptoms of     Anesthesia review:   Patient denies shortness of breath, fever, cough and chest pain at PAT appointment  Patient verbalized understanding of instructions that were given to them at the PAT appointment. Patient was also instructed that they will need to review over the PAT instructions again at home before surgery.

## 2023-09-21 ENCOUNTER — Encounter: Payer: Self-pay | Admitting: Family Medicine

## 2023-09-21 ENCOUNTER — Encounter (HOSPITAL_COMMUNITY)
Admission: RE | Admit: 2023-09-21 | Discharge: 2023-09-21 | Disposition: A | Discharge status: Home / Self Care | Payer: Medicare HMO | Source: Ambulatory Visit | Attending: Anesthesiology | Admitting: Anesthesiology

## 2023-09-21 DIAGNOSIS — Z01818 Encounter for other preprocedural examination: Secondary | ICD-10-CM

## 2023-09-21 DIAGNOSIS — E1169 Type 2 diabetes mellitus with other specified complication: Secondary | ICD-10-CM

## 2023-09-21 NOTE — Patient Instructions (Signed)
SURGICAL WAITING ROOM VISITATION  Patients having surgery or a procedure may have no more than 2 support people in the waiting area - these visitors may rotate.    Children under the age of 76 must have an adult with them who is not the patient.  Due to an increase in RSV and influenza rates and associated hospitalizations, children ages 44 and under may not visit patients in [facility] hospitals.  If the patient needs to stay at the hospital during part of their recovery, the visitor guidelines for inpatient rooms apply. Pre-op nurse will coordinate an appropriate time for 1 support person to accompany patient in pre-op.  This support person may not rotate.    Please refer to the [facility] website for the visitor guidelines for Inpatients (after your surgery is over and you are in a regular room).       Your procedure is scheduled on:  10/01/2023    Report to [facility] Main Entrance    Report to admitting at  0515 AM   Call this number if you have problems the morning of surgery [phone]   Do not eat food :After Midnight.   After Midnight you may have the following liquids until __0415____ AM  DAY OF SURGERY  Water Non-Citrus Juices (without pulp, NO RED-Apple, White grape, White cranberry) Black Coffee (NO MILK/CREAM OR CREAMERS, sugar ok)  Clear Tea (NO MILK/CREAM OR CREAMERS, sugar ok) regular and decaf                             Plain Jell-O (NO RED)                                           Fruit ices (not with fruit pulp, NO RED)                                     Popsicles (NO RED)                                                               Sports drinks like Gatorade (NO RED)                   The day of surgery:  Drink ONE (1) Pre-Surgery Clear Ensure or G2 at 0415 AM  ( have completed by ) the morning of surgery. Drink in one sitting. Do not sip.  This drink was given to you during your hospital  pre-op appointment visit. Nothing else to drink  after completing the  Pre-Surgery Clear Ensure or G2.          If you have questions, please contact your surgeon's office.       Oral Hygiene is also important to reduce your risk of infection.                                    Remember - BRUSH YOUR TEETH THE MORNING OF SURGERY WITH YOUR REGULAR  TOOTHPASTE  DENTURES WILL BE REMOVED PRIOR TO SURGERY PLEASE DO NOT APPLY "Poly grip" OR ADHESIVES!!!   Do NOT smoke after Midnight   Stop all vitamins and herbal supplements 7 days before surgery.   Take these medicines the morning of surgery with A SIP OF WATER:  flonase if needed   DO NOT TAKE ANY ORAL DIABETIC MEDICATIONS DAY OF YOUR SURGERY  Bring CPAP mask and tubing day of surgery.                              You may not have any metal on your body including hair pins, jewelry, and body piercing             Do not wear make-up, lotions, powders, perfumes/cologne, or deodorant  Do not wear nail polish including gel and S&S, artificial/acrylic nails, or any other type of covering on natural nails including finger and toenails. If you have artificial nails, gel coating, etc. that needs to be removed by a nail salon please have this removed prior to surgery or surgery may need to be canceled/ delayed if the surgeon/ anesthesia feels like they are unable to be safely monitored.   Do not shave  48 hours prior to surgery.               Men may shave face and neck.   Do not bring valuables to the hospital. [facility] IS NOT             RESPONSIBLE   FOR VALUABLES.   Contacts, glasses, dentures or bridgework may not be worn into surgery.   Bring small overnight bag day of surgery.   DO NOT BRING YOUR HOME MEDICATIONS TO THE HOSPITAL. PHARMACY WILL DISPENSE MEDICATIONS LISTED ON YOUR MEDICATION LIST TO YOU DURING YOUR ADMISSION IN THE HOSPITAL!    Patients discharged on the day of surgery will not be allowed to drive home.  Someone NEEDS to stay with you for the first 24 hours  after anesthesia.   Special Instructions: Bring a copy of your healthcare power of attorney and living will documents the day of surgery if you haven't scanned them before.              Please read over the following fact sheets you were given: IF YOU HAVE QUESTIONS ABOUT YOUR PRE-OP INSTRUCTIONS PLEASE CALL [phone]   If you received a COVID test during your pre-op visit  it is requested that you wear a mask when out in public, stay away from anyone that may not be feeling well and notify your surgeon if you develop symptoms. If you test positive for Covid or have been in contact with anyone that has tested positive in the last 10 days please notify you surgeon.      Pre-operative 5 CHG Bath Instructions   You can play a key role in reducing the risk of infection after surgery. Your skin needs to be as free of germs as possible. You can reduce the number of germs on your skin by washing with CHG (chlorhexidine gluconate) soap before surgery. CHG is an antiseptic soap that kills germs and continues to kill germs even after washing.   DO NOT use if you have an allergy to chlorhexidine/CHG or antibacterial soaps. If your skin becomes reddened or irritated, stop using the CHG and notify one of our RNs at [phone].   Please shower with the CHG soap starting  4 days before surgery using the following schedule:     Please keep in mind the following:  DO NOT shave, including legs and underarms, starting the day of your first shower.   You may shave your face at any point before/day of surgery.  Place clean sheets on your bed the day you start using CHG soap. Use a clean washcloth (not used since being washed) for each shower. DO NOT sleep with pets once you start using the CHG.   CHG Shower Instructions:  If you choose to wash your hair and private area, wash first with your normal shampoo/soap.  After you use shampoo/soap, rinse your hair and body thoroughly to remove shampoo/soap  residue.  Turn the water OFF and apply about 3 tablespoons (45 ml) of CHG soap to a CLEAN washcloth.  Apply CHG soap ONLY FROM YOUR NECK DOWN TO YOUR TOES (washing for 3-5 minutes)  DO NOT use CHG soap on face, private areas, open wounds, or sores.  Pay special attention to the area where your surgery is being performed.  If you are having back surgery, having someone wash your back for you may be helpful. Wait 2 minutes after CHG soap is applied, then you may rinse off the CHG soap.  Pat dry with a clean towel  Put on clean clothes/pajamas   If you choose to wear lotion, please use ONLY the CHG-compatible lotions on the back of this paper.     Additional instructions for the day of surgery: DO NOT APPLY any lotions, deodorants, cologne, or perfumes.   Put on clean/comfortable clothes.  Brush your teeth.  Ask your nurse before applying any prescription medications to the skin.      CHG Compatible Lotions   Aveeno Moisturizing lotion  Cetaphil Moisturizing Cream  Cetaphil Moisturizing Lotion  Clairol Herbal Essence Moisturizing Lotion, Dry Skin  Clairol Herbal Essence Moisturizing Lotion, Extra Dry Skin  Clairol Herbal Essence Moisturizing Lotion, Normal Skin  Curel Age Defying Therapeutic Moisturizing Lotion with Alpha Hydroxy  Curel Extreme Care Body Lotion  Curel Soothing Hands Moisturizing Hand Lotion  Curel Therapeutic Moisturizing Cream, Fragrance-Free  Curel Therapeutic Moisturizing Lotion, Fragrance-Free  Curel Therapeutic Moisturizing Lotion, Original Formula  Eucerin Daily Replenishing Lotion  Eucerin Dry Skin Therapy Plus Alpha Hydroxy Crme  Eucerin Dry Skin Therapy Plus Alpha Hydroxy Lotion  Eucerin Original Crme  Eucerin Original Lotion  Eucerin Plus Crme Eucerin Plus Lotion  Eucerin TriLipid Replenishing Lotion  Keri Anti-Bacterial Hand Lotion  Keri Deep Conditioning Original Lotion Dry Skin Formula Softly Scented  Keri Deep Conditioning Original Lotion,  Fragrance Free Sensitive Skin Formula  Keri Lotion Fast Absorbing Fragrance Free Sensitive Skin Formula  Keri Lotion Fast Absorbing Softly Scented Dry Skin Formula  Keri Original Lotion  Keri Skin Renewal Lotion Keri Silky Smooth Lotion  Keri Silky Smooth Sensitive Skin Lotion  Nivea Body Creamy Conditioning Oil  Nivea Body Extra Enriched Teacher, adult education Moisturizing Lotion Nivea Crme  Nivea Skin Firming Lotion  NutraDerm 30 Skin Lotion  NutraDerm Skin Lotion  NutraDerm Therapeutic Skin Cream  NutraDerm Therapeutic Skin Lotion  ProShield Protective Hand Cream  Provon moisturizing lotion

## 2023-09-21 NOTE — Progress Notes (Addendum)
 Anesthesia Review:  PCP: Clotilda Single- LOV 07/02/23  Cardiologist : none  Chest x-ray : EKG : 09/25/23  Echo : Stress test: Cardiac Cath :  Activity level: can do a flight of stairs without difficutly  Sleep Study/ CPAP : has cpap  Fasting Blood Sugar :      / Checks Blood Sugar -- times a day:   Blood Thinner/ Instructions /Last Dose: ASA / Instructions/ Last Dose :      Wedding band does not come off per pt Unable to take off.     Ozempic - last dose on 09/16/23  PT states he is not diabetic nor prediabetic.

## 2023-09-24 ENCOUNTER — Encounter (HOSPITAL_BASED_OUTPATIENT_CLINIC_OR_DEPARTMENT_OTHER): Payer: Self-pay | Admitting: Physical Therapy

## 2023-09-25 ENCOUNTER — Encounter (HOSPITAL_COMMUNITY): Payer: Self-pay

## 2023-09-25 ENCOUNTER — Other Ambulatory Visit: Payer: Self-pay

## 2023-09-25 ENCOUNTER — Encounter (HOSPITAL_COMMUNITY)
Admission: RE | Admit: 2023-09-25 | Discharge: 2023-09-25 | Disposition: A | Discharge status: Home / Self Care | Payer: Medicare HMO | Source: Ambulatory Visit | Attending: Orthopedic Surgery | Admitting: Orthopedic Surgery

## 2023-09-25 DIAGNOSIS — M51369 Other intervertebral disc degeneration, lumbar region without mention of lumbar back pain or lower extremity pain: Secondary | ICD-10-CM | POA: Diagnosis not present

## 2023-09-25 DIAGNOSIS — M5030 Other cervical disc degeneration, unspecified cervical region: Secondary | ICD-10-CM | POA: Insufficient documentation

## 2023-09-25 DIAGNOSIS — E1169 Type 2 diabetes mellitus with other specified complication: Secondary | ICD-10-CM | POA: Insufficient documentation

## 2023-09-25 DIAGNOSIS — Z6836 Body mass index (BMI) 36.0-36.9, adult: Secondary | ICD-10-CM | POA: Diagnosis not present

## 2023-09-25 DIAGNOSIS — Z7985 Long-term (current) use of injectable non-insulin antidiabetic drugs: Secondary | ICD-10-CM | POA: Insufficient documentation

## 2023-09-25 DIAGNOSIS — M1712 Unilateral primary osteoarthritis, left knee: Secondary | ICD-10-CM | POA: Insufficient documentation

## 2023-09-25 DIAGNOSIS — G4733 Obstructive sleep apnea (adult) (pediatric): Secondary | ICD-10-CM | POA: Diagnosis not present

## 2023-09-25 DIAGNOSIS — E669 Obesity, unspecified: Secondary | ICD-10-CM | POA: Insufficient documentation

## 2023-09-25 DIAGNOSIS — Z01818 Encounter for other preprocedural examination: Secondary | ICD-10-CM | POA: Insufficient documentation

## 2023-09-25 HISTORY — DX: Sleep apnea, unspecified: G47.30

## 2023-09-25 LAB — HEMOGLOBIN A1C
Hgb A1c MFr Bld: 6.1 % — ABNORMAL HIGH (ref 4.8–5.6)
Mean Plasma Glucose: 128 mg/dL

## 2023-09-25 LAB — CBC
HCT: 45.9 % (ref 39.0–52.0)
Hemoglobin: 16 g/dL (ref 13.0–17.0)
MCH: 33.5 pg (ref 26.0–34.0)
MCHC: 34.9 g/dL (ref 30.0–36.0)
MCV: 96 fL (ref 80.0–100.0)
Platelets: 221 10*3/uL (ref 150–400)
RBC: 4.78 MIL/uL (ref 4.22–5.81)
RDW: 12.8 % (ref 11.5–15.5)
WBC: 5.1 10*3/uL (ref 4.0–10.5)
nRBC: 0 % (ref 0.0–0.2)

## 2023-09-25 LAB — SURGICAL PCR SCREEN
MRSA, PCR: NEGATIVE
Staphylococcus aureus: NEGATIVE

## 2023-09-25 LAB — BASIC METABOLIC PANEL
Anion gap: 8 (ref 5–15)
BUN: 26 mg/dL — ABNORMAL HIGH (ref 8–23)
CO2: 22 mmol/L (ref 22–32)
Calcium: 9 mg/dL (ref 8.9–10.3)
Chloride: 106 mmol/L (ref 98–111)
Creatinine, Ser: 0.99 mg/dL (ref 0.61–1.24)
GFR, Estimated: >60 mL/min (ref >60)
Glucose, Bld: 105 mg/dL — ABNORMAL HIGH (ref 70–99)
Potassium: 4.3 mmol/L (ref 3.5–5.1)
Sodium: 136 mmol/L (ref 135–145)

## 2023-09-27 ENCOUNTER — Encounter (HOSPITAL_COMMUNITY): Payer: Self-pay

## 2023-09-27 NOTE — Progress Notes (Signed)
 DISCUSSION: [name] [name] is a 73 yo male who presents to PAT prior to left TKA on 10/01/23 with Dr. Melodi for left knee OA. 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 Last seen on 07/02/23. He takes Ozempic  for obesity and prediabetes. A1c 6.1. All issues stable at that visit.  Patient follows with Neurology for OSA and migraines. Uses CPAP.  VS: BP 133/74   Pulse 73   Temp 36.8 C (Oral)   Resp 16   Ht 5' 9 (1.753 m)   Wt 112.9 kg   SpO2 98%   BMI 36.77 kg/m   PROVIDERS: Mercer Clotilda SAUNDERS, MD   LABS: Labs reviewed: Acceptable for surgery. (all labs ordered are listed, but only abnormal results are displayed)  Labs Reviewed  HEMOGLOBIN A1C - Abnormal; Notable for the following components:      Result Value   Hgb A1c MFr Bld 6.1 (*)    All other components within normal limits  BASIC METABOLIC PANEL - Abnormal; Notable for the following components:   Glucose, Bld 105 (*)    BUN 26 (*)    All other components within normal limits  SURGICAL PCR SCREEN  CBC     IMAGES:   EKG 09/25/23:  Normal sinus rhythm, rate 64  CV:  Past Medical History:  Diagnosis Date   ALLERGIC RHINITIS 04/22/2007   Allergy    Cancer (HCC)    HX SKIN CANCER   Complication of anesthesia    more concerned about positioning of head & neck because of TMJ   DDD (degenerative disc disease), cervical    Headache(784.0)    migraines   History of kidney stones    History of skin cancer    History of TMJ disorder    HYPERCHOLESTEROLEMIA 04/22/2007   HYPERLIPIDEMIA 07/29/2007   Hyperplastic colon polyp    NEPHROLITHIASIS, HX OF 07/29/2007   Osteoarth NOS-Unspec 04/22/2007   Pneumonia    Shoulder pain, acute    bilateral   Sleep apnea    cpap    Past Surgical History:  Procedure Laterality Date   APPENDECTOMY     BACK SURGERY  10/07/2015   COLONOSCOPY     10 yrs ago- 2 HPP     COLONOSCOPY  2019   CYSTOSCOPY/URETEROSCOPY/HOLMIUM LASER/STENT PLACEMENT Right 07/26/2022   Procedure: RIGHT URETEROSCOPY/HOLMIUM LASER/STENT PLACEMENT;  Surgeon: Lovie Arlyss CROME, MD;  Location: [facility];  Service: Urology;  Laterality: Right;  75 MINUTES NEEDED FOR CASE   JOINT REPLACEMENT     KNEE ARTHROSCOPY     right x2   SHOULDER SURGERY     right   skin cancer biopsy     L forehead   TOTAL KNEE ARTHROPLASTY Right 08/25/2013   Procedure: RIGHT TOTAL KNEE ARTHROPLASTY;  Surgeon: Dempsey LULLA Melodi, MD;  Location: [facility];  Service: Orthopedics;  Laterality: Right;   TOTAL SHOULDER ARTHROPLASTY Left 12/25/2013   DR SUPPLE    TOTAL SHOULDER ARTHROPLASTY Left 12/25/2013   Procedure: LEFT TOTAL SHOULDER ARTHROPLASTY;  Surgeon: Franky CHRISTELLA Pointer, MD;  Location: [facility];  Service: Orthopedics;  Laterality: Left;   TOTAL SHOULDER ARTHROPLASTY Right 06/18/2014   DR SUPPLE   TOTAL SHOULDER ARTHROPLASTY Right 06/18/2014   Procedure: RIGHT TOTAL SHOULDER ARTHROPLASTY;  Surgeon: Franky CHRISTELLA Pointer, MD;  Location: [facility];  Service: Orthopedics;  Laterality: Right;   WISDOM TOOTH EXTRACTION      MEDICATIONS:  acetaminophen  (TYLENOL )  650 MG CR tablet   allopurinol  (ZYLOPRIM ) 100 MG tablet   amitriptyline  (ELAVIL ) 100 MG tablet   DULoxetine  (CYMBALTA ) 60 MG capsule   EPINEPHRINE  0.3 mg/0.3 mL IJ SOAJ injection   fluticasone  (FLONASE ) 50 MCG/ACT nasal spray   Fremanezumab -vfrm (AJOVY ) 225 MG/1.5ML SOAJ   gabapentin  (NEURONTIN ) 300 MG capsule   meloxicam (MOBIC) 15 MG tablet   naproxen  (NAPROSYN ) 500 MG tablet   NON FORMULARY   pravastatin  (PRAVACHOL ) 40 MG tablet   Psyllium (METAMUCIL PO)   Semaglutide , 1 MG/DOSE, 4 MG/3ML SOPN   SUMAtriptan  (IMITREX ) 100 MG tablet   tamsulosin  (FLOMAX ) 0.4 MG CAPS capsule   tiZANidine  (ZANAFLEX ) 4 MG tablet    0.9 %  sodium chloride  infusion   Burnard CHRISTELLA Senna, PA-C [facility] Short Stay/Anesthesiology [facility] Phone [phone] 09/27/2023 10:54 AM

## 2023-09-27 NOTE — Anesthesia Preprocedure Evaluation (Addendum)
 Anesthesia Evaluation  Patient identified by MRN, date of birth, ID band Patient awake    Reviewed: Allergy & Precautions, NPO status , Patient's Chart, lab work & pertinent test results  Airway Mallampati: II  TM Distance: >3 FB Neck [name]: Full    Dental no notable dental hx.    Pulmonary sleep apnea    Pulmonary exam normal breath sounds clear to auscultation       Cardiovascular hypertension, Normal cardiovascular exam Rhythm:Regular Rate:Normal     Neuro/Psych  Headaches  Neuromuscular disease    GI/Hepatic Neg liver ROS,GERD  ,,  Endo/Other  diabetes, Type 2    Renal/GU      Musculoskeletal  (+) Arthritis ,    Abdominal   Peds  Hematology   Anesthesia Other Findings   Reproductive/Obstetrics                              Anesthesia Physical Anesthesia Plan  ASA: 3  Anesthesia Plan: Spinal and Regional   Post-op Pain Management: Regional block* and Tylenol  PO (pre-op)*   Induction:   PONV Risk Score and Plan: 3 and Ondansetron , Dexamethasone  and Midazolam   Airway Management Planned: Natural Airway and Simple Face Mask  Additional Equipment:   Intra-op Plan:   Post-operative Plan: Extubation in OR  Informed Consent: I have reviewed the patients History and Physical, chart, labs and discussed the procedure including the risks, benefits and alternatives for the proposed anesthesia with the patient or authorized representative who has indicated his/her understanding and acceptance.     Dental advisory given  Plan Discussed with: CRNA  Anesthesia Plan Comments: (See PAT note from 12/31 by K Odis PA-C  [name] [name] is a 73 yo male who presents to PAT prior to left TKA on 10/01/23 with Dr. Melodi for left knee OA. 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 Last seen on  07/02/23. He takes Ozempic  for obesity and prediabetes. A1c 6.1. All issues stable at that visit.   )         Anesthesia Quick Evaluation

## 2023-09-28 ENCOUNTER — Encounter: Payer: Self-pay | Admitting: Family Medicine

## 2023-09-28 DIAGNOSIS — R0981 Nasal congestion: Secondary | ICD-10-CM

## 2023-09-28 DIAGNOSIS — J019 Acute sinusitis, unspecified: Secondary | ICD-10-CM

## 2023-09-28 DIAGNOSIS — Z889 Allergy status to unspecified drugs, medicaments and biological substances status: Secondary | ICD-10-CM

## 2023-09-28 MED ORDER — ALLOPURINOL 100 MG PO TABS: 100.0000 mg | ORAL_TABLET | Freq: Every day | ORAL | 3 refills | Status: AC

## 2023-09-28 MED ORDER — PRAVASTATIN SODIUM 40 MG PO TABS: 40.0000 mg | ORAL_TABLET | Freq: Every day | ORAL | 3 refills | Status: DC

## 2023-09-28 MED ORDER — EPINEPHRINE 0.3 MG/0.3ML IJ SOAJ: INTRAMUSCULAR | 0 refills | Status: AC

## 2023-09-28 MED ORDER — FLUTICASONE PROPIONATE 50 MCG/ACT NA SUSP: 1.0000 | Freq: Every day | NASAL | 1 refills | Status: AC

## 2023-09-30 ENCOUNTER — Encounter: Payer: Self-pay | Admitting: Family Medicine

## 2023-10-01 ENCOUNTER — Encounter (HOSPITAL_COMMUNITY)
Admission: RE | Disposition: A | Discharge status: Home / Self Care | Payer: Self-pay | Source: Ambulatory Visit | Attending: Orthopedic Surgery

## 2023-10-01 ENCOUNTER — Other Ambulatory Visit: Payer: Self-pay | Admitting: Anesthesiology

## 2023-10-01 ENCOUNTER — Ambulatory Visit (HOSPITAL_COMMUNITY): Payer: Self-pay

## 2023-10-01 ENCOUNTER — Ambulatory Visit (HOSPITAL_COMMUNITY): Payer: Medicare Other | Admitting: Medical

## 2023-10-01 ENCOUNTER — Observation Stay (HOSPITAL_COMMUNITY)
Admission: RE | Admit: 2023-10-01 | Discharge: 2023-10-02 | Disposition: A | Discharge status: Home / Self Care | Payer: Medicare Other | Source: Ambulatory Visit | Attending: Orthopedic Surgery | Admitting: Orthopedic Surgery

## 2023-10-01 ENCOUNTER — Other Ambulatory Visit: Payer: Self-pay

## 2023-10-01 ENCOUNTER — Encounter (HOSPITAL_COMMUNITY): Payer: Self-pay | Admitting: Orthopedic Surgery

## 2023-10-01 ENCOUNTER — Telehealth: Payer: Self-pay | Admitting: Family Medicine

## 2023-10-01 DIAGNOSIS — G43109 Migraine with aura, not intractable, without status migrainosus: Secondary | ICD-10-CM

## 2023-10-01 DIAGNOSIS — E785 Hyperlipidemia, unspecified: Secondary | ICD-10-CM | POA: Insufficient documentation

## 2023-10-01 DIAGNOSIS — G4730 Sleep apnea, unspecified: Secondary | ICD-10-CM | POA: Insufficient documentation

## 2023-10-01 DIAGNOSIS — Z96651 Presence of right artificial knee joint: Secondary | ICD-10-CM | POA: Insufficient documentation

## 2023-10-01 DIAGNOSIS — G43711 Chronic migraine without aura, intractable, with status migrainosus: Secondary | ICD-10-CM

## 2023-10-01 DIAGNOSIS — G4733 Obstructive sleep apnea (adult) (pediatric): Secondary | ICD-10-CM

## 2023-10-01 DIAGNOSIS — Z7985 Long-term (current) use of injectable non-insulin antidiabetic drugs: Secondary | ICD-10-CM | POA: Insufficient documentation

## 2023-10-01 DIAGNOSIS — I10 Essential (primary) hypertension: Secondary | ICD-10-CM

## 2023-10-01 DIAGNOSIS — E7849 Other hyperlipidemia: Secondary | ICD-10-CM | POA: Diagnosis not present

## 2023-10-01 DIAGNOSIS — E119 Type 2 diabetes mellitus without complications: Secondary | ICD-10-CM | POA: Diagnosis not present

## 2023-10-01 DIAGNOSIS — Z85828 Personal history of other malignant neoplasm of skin: Secondary | ICD-10-CM | POA: Diagnosis not present

## 2023-10-01 DIAGNOSIS — M5030 Other cervical disc degeneration, unspecified cervical region: Secondary | ICD-10-CM | POA: Insufficient documentation

## 2023-10-01 DIAGNOSIS — Z96612 Presence of left artificial shoulder joint: Secondary | ICD-10-CM | POA: Diagnosis not present

## 2023-10-01 DIAGNOSIS — Z87442 Personal history of urinary calculi: Secondary | ICD-10-CM | POA: Diagnosis not present

## 2023-10-01 DIAGNOSIS — M1712 Unilateral primary osteoarthritis, left knee: Principal | ICD-10-CM | POA: Insufficient documentation

## 2023-10-01 DIAGNOSIS — Z96611 Presence of right artificial shoulder joint: Secondary | ICD-10-CM | POA: Insufficient documentation

## 2023-10-01 DIAGNOSIS — Z79899 Other long term (current) drug therapy: Secondary | ICD-10-CM | POA: Insufficient documentation

## 2023-10-01 DIAGNOSIS — G8918 Other acute postprocedural pain: Secondary | ICD-10-CM | POA: Diagnosis not present

## 2023-10-01 DIAGNOSIS — E1169 Type 2 diabetes mellitus with other specified complication: Secondary | ICD-10-CM

## 2023-10-01 DIAGNOSIS — M179 Osteoarthritis of knee, unspecified: Principal | ICD-10-CM | POA: Diagnosis present

## 2023-10-01 HISTORY — PX: TOTAL KNEE ARTHROPLASTY: SHX125

## 2023-10-01 LAB — GLUCOSE, CAPILLARY: Glucose-Capillary: 105 mg/dL — ABNORMAL HIGH (ref 70–99)

## 2023-10-01 SURGERY — ARTHROPLASTY, KNEE, TOTAL
Anesthesia: Regional | Site: Knee | Laterality: Left

## 2023-10-01 MED ORDER — DULOXETINE HCL 60 MG PO CPEP
60.0000 mg | ORAL_CAPSULE | Freq: Every day | ORAL | Status: DC
  Administered 2023-10-01: 60 mg via ORAL
  Filled 2023-10-01: qty 1

## 2023-10-01 MED ORDER — CEFAZOLIN SODIUM-DEXTROSE 2-4 GM/100ML-% IV SOLN
2.0000 g | INTRAVENOUS | Status: AC
  Administered 2023-10-01: 2 g via INTRAVENOUS
  Filled 2023-10-01: qty 100

## 2023-10-01 MED ORDER — FENTANYL CITRATE (PF) 100 MCG/2ML IJ SOLN
INTRAMUSCULAR | Status: AC
  Filled 2023-10-01: qty 2

## 2023-10-01 MED ORDER — OXYCODONE HCL 5 MG/5ML PO SOLN: 5.0000 mg | Freq: Once | ORAL | Status: DC | PRN

## 2023-10-01 MED ORDER — PRAVASTATIN SODIUM 20 MG PO TABS
40.0000 mg | ORAL_TABLET | Freq: Every day | ORAL | Status: DC
  Administered 2023-10-01: 40 mg via ORAL
  Filled 2023-10-01: qty 2

## 2023-10-01 MED ORDER — TRANEXAMIC ACID-NACL 1000-0.7 MG/100ML-% IV SOLN
1000.0000 mg | INTRAVENOUS | Status: AC
  Administered 2023-10-01: 1000 mg via INTRAVENOUS
  Filled 2023-10-01: qty 100

## 2023-10-01 MED ORDER — ACETAMINOPHEN 10 MG/ML IV SOLN: 1000.0000 mg | Freq: Once | INTRAVENOUS | Status: DC | PRN

## 2023-10-01 MED ORDER — ACETAMINOPHEN 10 MG/ML IV SOLN
1000.0000 mg | Freq: Four times a day (QID) | INTRAVENOUS | Status: DC
  Administered 2023-10-01: 1000 mg via INTRAVENOUS
  Filled 2023-10-01: qty 100

## 2023-10-01 MED ORDER — MIDAZOLAM HCL 2 MG/2ML IJ SOLN
INTRAMUSCULAR | Status: AC
  Filled 2023-10-01: qty 2

## 2023-10-01 MED ORDER — BUPIVACAINE LIPOSOME 1.3 % IJ SUSP: 20.0000 mL | Freq: Once | INTRAMUSCULAR | Status: DC

## 2023-10-01 MED ORDER — HYDROMORPHONE HCL 1 MG/ML IJ SOLN
INTRAMUSCULAR | Status: DC | PRN
  Administered 2023-10-01 (×4): .5 mg via INTRAVENOUS

## 2023-10-01 MED ORDER — EPHEDRINE SULFATE-NACL 50-0.9 MG/10ML-% IV SOSY
PREFILLED_SYRINGE | INTRAVENOUS | Status: DC | PRN
  Administered 2023-10-01 (×2): 5 mg via INTRAVENOUS
  Administered 2023-10-01: 10 mg via INTRAVENOUS

## 2023-10-01 MED ORDER — FENTANYL CITRATE (PF) 100 MCG/2ML IJ SOLN
INTRAMUSCULAR | Status: DC | PRN
  Administered 2023-10-01 (×2): 50 ug via INTRAVENOUS

## 2023-10-01 MED ORDER — GLYCOPYRROLATE 0.2 MG/ML IJ SOLN
INTRAMUSCULAR | Status: AC
  Filled 2023-10-01: qty 1

## 2023-10-01 MED ORDER — ONDANSETRON HCL 4 MG/2ML IJ SOLN
INTRAMUSCULAR | Status: DC | PRN
  Administered 2023-10-01: 4 mg via INTRAVENOUS

## 2023-10-01 MED ORDER — POVIDONE-IODINE 10 % EX SWAB: 2.0000 | Freq: Once | CUTANEOUS | Status: DC

## 2023-10-01 MED ORDER — PSYLLIUM 95 % PO PACK
1.0000 | PACK | Freq: Every day | ORAL | Status: DC
  Filled 2023-10-01: qty 1

## 2023-10-01 MED ORDER — SUMATRIPTAN SUCCINATE 50 MG PO TABS
100.0000 mg | ORAL_TABLET | ORAL | Status: DC | PRN
  Administered 2023-10-01: 100 mg via ORAL
  Filled 2023-10-01 (×2): qty 2

## 2023-10-01 MED ORDER — OXYCODONE HCL 5 MG PO TABS: 5.0000 mg | ORAL_TABLET | Freq: Once | ORAL | Status: DC | PRN

## 2023-10-01 MED ORDER — SODIUM CHLORIDE (PF) 0.9 % IJ SOLN
INTRAMUSCULAR | Status: AC
  Filled 2023-10-01: qty 50

## 2023-10-01 MED ORDER — BISACODYL 10 MG RE SUPP: 10.0000 mg | Freq: Every day | RECTAL | Status: DC | PRN

## 2023-10-01 MED ORDER — ASPIRIN 81 MG PO CHEW
81.0000 mg | CHEWABLE_TABLET | Freq: Two times a day (BID) | ORAL | Status: DC
Start: 2023-10-02 — End: 2023-10-02
  Administered 2023-10-02: 81 mg via ORAL
  Filled 2023-10-01: qty 1

## 2023-10-01 MED ORDER — POLYETHYLENE GLYCOL 3350 17 G PO PACK: 17.0000 g | PACK | Freq: Every day | ORAL | Status: DC | PRN

## 2023-10-01 MED ORDER — ACETAMINOPHEN 325 MG PO TABS: 325.0000 mg | ORAL_TABLET | Freq: Four times a day (QID) | ORAL | Status: DC | PRN

## 2023-10-01 MED ORDER — CHLORHEXIDINE GLUCONATE 0.12 % MT SOLN
15.0000 mL | Freq: Once | OROMUCOSAL | Status: AC
Start: 2023-10-01 — End: 2023-10-01
  Administered 2023-10-01: 15 mL via OROMUCOSAL

## 2023-10-01 MED ORDER — LACTATED RINGERS IV SOLN: INTRAVENOUS | Status: DC

## 2023-10-01 MED ORDER — SODIUM CHLORIDE (PF) 0.9 % IJ SOLN
INTRAMUSCULAR | Status: AC
  Filled 2023-10-01: qty 10

## 2023-10-01 MED ORDER — PROPOFOL 1000 MG/100ML IV EMUL
INTRAVENOUS | Status: AC
  Filled 2023-10-01: qty 100

## 2023-10-01 MED ORDER — SUMATRIPTAN SUCCINATE 100 MG PO TABS: ORAL_TABLET | ORAL | 3 refills | Status: AC

## 2023-10-01 MED ORDER — EPHEDRINE 5 MG/ML INJ
INTRAVENOUS | Status: AC
  Filled 2023-10-01: qty 5

## 2023-10-01 MED ORDER — SODIUM CHLORIDE (PF) 0.9 % IJ SOLN
INTRAMUSCULAR | Status: DC | PRN
  Administered 2023-10-01: 60 mL

## 2023-10-01 MED ORDER — ONDANSETRON HCL 4 MG/2ML IJ SOLN: 4.0000 mg | Freq: Four times a day (QID) | INTRAMUSCULAR | Status: DC | PRN

## 2023-10-01 MED ORDER — AMITRIPTYLINE HCL 100 MG PO TABS: 100.0000 mg | ORAL_TABLET | Freq: Every day | ORAL | 3 refills | Status: AC

## 2023-10-01 MED ORDER — DEXTROSE-SODIUM CHLORIDE 5-0.45 % IV SOLN: INTRAVENOUS | Status: AC

## 2023-10-01 MED ORDER — MIDAZOLAM HCL 5 MG/5ML IJ SOLN
INTRAMUSCULAR | Status: DC | PRN
  Administered 2023-10-01 (×2): 1 mg via INTRAVENOUS

## 2023-10-01 MED ORDER — INSULIN ASPART 100 UNIT/ML IJ SOLN: 0.0000 [IU] | INTRAMUSCULAR | Status: DC | PRN

## 2023-10-01 MED ORDER — FENTANYL CITRATE PF 50 MCG/ML IJ SOSY
25.0000 ug | PREFILLED_SYRINGE | INTRAMUSCULAR | Status: DC | PRN
  Administered 2023-10-01 (×2): 50 ug via INTRAVENOUS

## 2023-10-01 MED ORDER — TIZANIDINE HCL 4 MG PO TABS: ORAL_TABLET | ORAL | 0 refills | Status: DC

## 2023-10-01 MED ORDER — DOCUSATE SODIUM 100 MG PO CAPS
100.0000 mg | ORAL_CAPSULE | Freq: Two times a day (BID) | ORAL | Status: DC
Start: 2023-10-01 — End: 2023-10-02
  Administered 2023-10-01 – 2023-10-02 (×3): 100 mg via ORAL
  Filled 2023-10-01 (×3): qty 1

## 2023-10-01 MED ORDER — FENTANYL CITRATE PF 50 MCG/ML IJ SOSY
PREFILLED_SYRINGE | INTRAMUSCULAR | Status: AC
  Filled 2023-10-01: qty 1

## 2023-10-01 MED ORDER — ORAL CARE MOUTH RINSE: 15.0000 mL | Freq: Once | OROMUCOSAL | Status: AC

## 2023-10-01 MED ORDER — HYDROMORPHONE HCL 1 MG/ML IJ SOLN
0.5000 mg | INTRAMUSCULAR | Status: DC | PRN
  Administered 2023-10-01: 1 mg via INTRAVENOUS
  Filled 2023-10-01: qty 1

## 2023-10-01 MED ORDER — GLYCOPYRROLATE 0.2 MG/ML IJ SOLN
INTRAMUSCULAR | Status: DC | PRN
  Administered 2023-10-01: .2 mg via INTRAVENOUS

## 2023-10-01 MED ORDER — CEFAZOLIN SODIUM-DEXTROSE 2-4 GM/100ML-% IV SOLN
2.0000 g | Freq: Four times a day (QID) | INTRAVENOUS | Status: AC
  Administered 2023-10-01 (×2): 2 g via INTRAVENOUS
  Filled 2023-10-01 (×2): qty 100

## 2023-10-01 MED ORDER — FENTANYL CITRATE PF 50 MCG/ML IJ SOSY
PREFILLED_SYRINGE | INTRAMUSCULAR | Status: AC
  Filled 2023-10-01: qty 2

## 2023-10-01 MED ORDER — HYDROMORPHONE HCL 2 MG PO TABS
2.0000 mg | ORAL_TABLET | ORAL | Status: DC | PRN
  Administered 2023-10-01 (×3): 2 mg via ORAL
  Filled 2023-10-01 (×5): qty 1

## 2023-10-01 MED ORDER — DEXAMETHASONE SODIUM PHOSPHATE 10 MG/ML IJ SOLN
10.0000 mg | Freq: Once | INTRAMUSCULAR | Status: AC
Start: 2023-10-02 — End: 2023-10-02
  Administered 2023-10-02: 10 mg via INTRAVENOUS
  Filled 2023-10-01: qty 1

## 2023-10-01 MED ORDER — STERILE WATER FOR IRRIGATION IR SOLN
Status: DC | PRN
  Administered 2023-10-01: 1000 mL

## 2023-10-01 MED ORDER — ONDANSETRON HCL 4 MG/2ML IJ SOLN
INTRAMUSCULAR | Status: AC
  Filled 2023-10-01: qty 2

## 2023-10-01 MED ORDER — GABAPENTIN 300 MG PO CAPS: 300.0000 mg | ORAL_CAPSULE | Freq: Every day | ORAL | 3 refills | Status: DC

## 2023-10-01 MED ORDER — MENTHOL 3 MG MT LOZG: 1.0000 | LOZENGE | OROMUCOSAL | Status: DC | PRN

## 2023-10-01 MED ORDER — DEXAMETHASONE SODIUM PHOSPHATE 10 MG/ML IJ SOLN
INTRAMUSCULAR | Status: AC
  Filled 2023-10-01: qty 1

## 2023-10-01 MED ORDER — DEXAMETHASONE SODIUM PHOSPHATE 10 MG/ML IJ SOLN
8.0000 mg | Freq: Once | INTRAMUSCULAR | Status: AC
  Administered 2023-10-01: 8 mg via INTRAVENOUS

## 2023-10-01 MED ORDER — METOCLOPRAMIDE HCL 5 MG/ML IJ SOLN: 5.0000 mg | Freq: Three times a day (TID) | INTRAMUSCULAR | Status: DC | PRN

## 2023-10-01 MED ORDER — FLEET ENEMA RE ENEM: 1.0000 | ENEMA | Freq: Once | RECTAL | Status: DC | PRN

## 2023-10-01 MED ORDER — ACETAMINOPHEN 500 MG PO TABS
1000.0000 mg | ORAL_TABLET | Freq: Four times a day (QID) | ORAL | Status: AC
  Administered 2023-10-01 – 2023-10-02 (×4): 1000 mg via ORAL
  Filled 2023-10-01 (×4): qty 2

## 2023-10-01 MED ORDER — ONDANSETRON HCL 4 MG PO TABS: 4.0000 mg | ORAL_TABLET | Freq: Four times a day (QID) | ORAL | Status: DC | PRN

## 2023-10-01 MED ORDER — AMITRIPTYLINE HCL 50 MG PO TABS
100.0000 mg | ORAL_TABLET | Freq: Every day | ORAL | Status: DC
  Administered 2023-10-01: 100 mg via ORAL
  Filled 2023-10-01: qty 2

## 2023-10-01 MED ORDER — DULOXETINE HCL 60 MG PO CPEP: 60.0000 mg | ORAL_CAPSULE | Freq: Every day | ORAL | 2 refills | Status: DC

## 2023-10-01 MED ORDER — GABAPENTIN 300 MG PO CAPS
300.0000 mg | ORAL_CAPSULE | Freq: Every day | ORAL | Status: DC
  Administered 2023-10-01: 300 mg via ORAL
  Filled 2023-10-01: qty 1

## 2023-10-01 MED ORDER — HYDROMORPHONE HCL 2 MG PO TABS
4.0000 mg | ORAL_TABLET | ORAL | Status: DC | PRN
  Administered 2023-10-01 – 2023-10-02 (×4): 4 mg via ORAL
  Filled 2023-10-01 (×4): qty 2

## 2023-10-01 MED ORDER — 0.9 % SODIUM CHLORIDE (POUR BTL) OPTIME
TOPICAL | Status: DC | PRN
  Administered 2023-10-01: 1000 mL

## 2023-10-01 MED ORDER — METOCLOPRAMIDE HCL 5 MG PO TABS: 5.0000 mg | ORAL_TABLET | Freq: Three times a day (TID) | ORAL | Status: DC | PRN

## 2023-10-01 MED ORDER — ALLOPURINOL 100 MG PO TABS
100.0000 mg | ORAL_TABLET | Freq: Every day | ORAL | Status: DC
  Administered 2023-10-02: 100 mg via ORAL
  Filled 2023-10-01: qty 1

## 2023-10-01 MED ORDER — ROPIVACAINE HCL 5 MG/ML IJ SOLN
INTRAMUSCULAR | Status: DC | PRN
  Administered 2023-10-01: 20 mL via PERINEURAL

## 2023-10-01 MED ORDER — SODIUM CHLORIDE 0.9 % IR SOLN
Status: DC | PRN
  Administered 2023-10-01: 1000 mL

## 2023-10-01 MED ORDER — FLUTICASONE PROPIONATE 50 MCG/ACT NA SUSP
1.0000 | Freq: Every day | NASAL | Status: DC
  Administered 2023-10-02: 1 via NASAL
  Filled 2023-10-01: qty 16

## 2023-10-01 MED ORDER — TIZANIDINE HCL 4 MG PO TABS
4.0000 mg | ORAL_TABLET | Freq: Four times a day (QID) | ORAL | Status: DC | PRN
  Administered 2023-10-01: 4 mg via ORAL
  Filled 2023-10-01: qty 1

## 2023-10-01 MED ORDER — PROPOFOL 10 MG/ML IV BOLUS
INTRAVENOUS | Status: DC | PRN
  Administered 2023-10-01: 20 mg via INTRAVENOUS
  Administered 2023-10-01: 10 mg via INTRAVENOUS
  Administered 2023-10-01: 20 mg via INTRAVENOUS

## 2023-10-01 MED ORDER — PHENOL 1.4 % MT LIQD: 1.0000 | OROMUCOSAL | Status: DC | PRN

## 2023-10-01 MED ORDER — HYDROMORPHONE HCL 2 MG/ML IJ SOLN
INTRAMUSCULAR | Status: AC
  Filled 2023-10-01: qty 1

## 2023-10-01 MED ORDER — BUPIVACAINE LIPOSOME 1.3 % IJ SUSP
INTRAMUSCULAR | Status: AC
  Filled 2023-10-01: qty 20

## 2023-10-01 MED ORDER — TAMSULOSIN HCL 0.4 MG PO CAPS
0.4000 mg | ORAL_CAPSULE | Freq: Every day | ORAL | Status: DC
  Administered 2023-10-01: 0.4 mg via ORAL
  Filled 2023-10-01: qty 1

## 2023-10-01 MED ORDER — DROPERIDOL 2.5 MG/ML IJ SOLN: 0.6250 mg | Freq: Once | INTRAMUSCULAR | Status: DC | PRN

## 2023-10-01 SURGICAL SUPPLY — 45 items
ATTUNE MED DOME PAT 41 KNEE (Knees) IMPLANT
ATTUNE PS FEM LT SZ 7 CEM KNEE (Femur) IMPLANT
ATTUNE PSRP INSR SZ7 7 KNEE (Insert) IMPLANT
BAG COUNTER SPONGE SURGICOUNT (BAG) IMPLANT
BAG ZIPLOCK 12X15 (MISCELLANEOUS) ×1 IMPLANT
BASE TIBIAL ROT PLAT SZ 7 KNEE (Knees) IMPLANT
BLADE SAG 18X100X1.27 (BLADE) ×1 IMPLANT
BLADE SAW SGTL 11.0X1.19X90.0M (BLADE) ×1 IMPLANT
BNDG ELASTIC 6INX 5YD STR LF (GAUZE/BANDAGES/DRESSINGS) ×1 IMPLANT
BNDG ELASTIC 6X10 VLCR STRL LF (GAUZE/BANDAGES/DRESSINGS) IMPLANT
BOWL SMART MIX CTS (DISPOSABLE) ×1 IMPLANT
CEMENT HV SMART SET (Cement) ×2 IMPLANT
COVER SURGICAL LIGHT HANDLE (MISCELLANEOUS) ×1 IMPLANT
CUFF TRNQT CYL 34X4.125X (TOURNIQUET CUFF) ×1 IMPLANT
DERMABOND ADVANCED .7 DNX12 (GAUZE/BANDAGES/DRESSINGS) ×1 IMPLANT
DRAPE U-SHAPE 47X51 STRL (DRAPES) ×1 IMPLANT
DRSG AQUACEL AG ADV 3.5X10 (GAUZE/BANDAGES/DRESSINGS) ×1 IMPLANT
DURAPREP 26ML APPLICATOR (WOUND CARE) ×1 IMPLANT
ELECT REM PT RETURN 15FT ADLT (MISCELLANEOUS) ×1 IMPLANT
GLOVE BIO SURGEON STRL SZ 6.5 (GLOVE) IMPLANT
GLOVE BIO SURGEON STRL SZ8 (GLOVE) ×1 IMPLANT
GLOVE BIOGEL PI IND STRL 6.5 (GLOVE) IMPLANT
GLOVE BIOGEL PI IND STRL 7.0 (GLOVE) IMPLANT
GLOVE BIOGEL PI IND STRL 8 (GLOVE) ×1 IMPLANT
GOWN STRL REUS W/ TWL LRG LVL3 (GOWN DISPOSABLE) ×1 IMPLANT
HOLDER FOLEY CATH W/STRAP (MISCELLANEOUS) IMPLANT
IMMOBILIZER KNEE 20 (SOFTGOODS) ×1
IMMOBILIZER KNEE 20 THIGH 36 (SOFTGOODS) ×1 IMPLANT
KIT TURNOVER KIT A (KITS) IMPLANT
MANIFOLD NEPTUNE II (INSTRUMENTS) ×1 IMPLANT
NS IRRIG 1000ML POUR BTL (IV SOLUTION) ×1 IMPLANT
PACK TOTAL KNEE CUSTOM (KITS) ×1 IMPLANT
PADDING CAST COTTON 6X4 STRL (CAST SUPPLIES) ×2 IMPLANT
PIN STEINMAN FIXATION KNEE (PIN) IMPLANT
PROTECTOR NERVE ULNAR (MISCELLANEOUS) ×1 IMPLANT
SET HNDPC FAN SPRY TIP SCT (DISPOSABLE) ×1 IMPLANT
SUT MNCRL AB 4-0 PS2 18 (SUTURE) ×1 IMPLANT
SUT STRATAFIX 0 PDS 27 VIOLET (SUTURE) ×1
SUT VIC AB 2-0 CT1 TAPERPNT 27 (SUTURE) ×3 IMPLANT
SUTURE STRATFX 0 PDS 27 VIOLET (SUTURE) ×1 IMPLANT
TIBIAL BASE ROT PLAT SZ 7 KNEE (Knees) ×1 IMPLANT
TRAY FOLEY MTR SLVR 16FR STAT (SET/KITS/TRAYS/PACK) IMPLANT
TUBE SUCTION HIGH CAP CLEAR NV (SUCTIONS) ×1 IMPLANT
WATER STERILE IRR 1000ML POUR (IV SOLUTION) ×2 IMPLANT
WRAP KNEE MAXI GEL POST OP (GAUZE/BANDAGES/DRESSINGS) ×1 IMPLANT

## 2023-10-01 NOTE — Progress Notes (Signed)
 Orthopedic Tech Progress Note Patient Details:  [name] [name] Jul 19, 1951 [id number]  CPM Left Knee CPM Left Knee: On Left Knee Flexion (Degrees): 40 Left Knee Extension (Degrees): 10  Post Interventions Patient Tolerated: Poor  [name] 10/01/2023, 9:43 AM

## 2023-10-01 NOTE — Discharge Instructions (Addendum)
 Ollen Gross, MD Total Joint Specialist [facility] [place] [address] [place], [place] [address] [phone]  TOTAL KNEE REPLACEMENT POSTOPERATIVE DIRECTIONS    Knee Rehabilitation, Guidelines Following Surgery  Results after knee surgery are often greatly improved when you follow the exercise, range of motion and muscle strengthening exercises prescribed by your doctor. Safety measures are also important to protect the knee from further injury. If any of these exercises cause you to have increased pain or swelling in your knee joint, decrease the amount until you are comfortable again and slowly increase them. If you have problems or questions, call your caregiver or physical therapist for advice.   BLOOD CLOT PREVENTION Take an 81 mg Aspirin two times a day for three weeks following surgery. Then take an 81 mg Aspirin once a day for three weeks. Then discontinue Aspirin. You may resume your vitamins/supplements upon discharge from the hospital. Do not take any NSAIDs (Advil, Aleve, Ibuprofen, Meloxicam, etc.) until you have discontinued the 81 mg Aspirin twice a day.  HOME CARE INSTRUCTIONS  Remove items at home which could result in a fall. This includes throw rugs or furniture in walking pathways.  ICE to the affected knee as much as tolerated. Icing helps control swelling. If the swelling is well controlled you will be more comfortable and rehab easier. Continue to use ice on the knee for pain and swelling from surgery. You may notice swelling that will progress down to the foot and ankle. This is normal after surgery. Elevate the leg when you are not up walking on it.    Continue to use the breathing machine which will help keep your temperature down. It is common for your temperature to cycle up and down following surgery, especially at night when you are not up moving around and exerting yourself. The breathing machine keeps your lungs expanded and your  temperature down. Do not place pillow under the operative knee, focus on keeping the knee straight while resting  DIET You may resume your previous home diet once you are discharged from the hospital.  DRESSING / WOUND CARE / SHOWERING Keep your bulky bandage on for 2 days. On the third post-operative day you may remove the Ace bandage and gauze. There is a waterproof adhesive bandage on your skin which will stay in place until your first follow-up appointment. Once you remove this you will not need to place another bandage You may begin showering 3 days following surgery, but do not submerge the incision under water.  ACTIVITY For the first 5 days, the key is rest and control of pain and swelling Do your home exercises twice a day starting on post-operative day 3. On the days you go to physical therapy, just do the home exercises once that day. You should rest, ice and elevate the leg for 50 minutes out of every hour. Get up and walk/stretch for 10 minutes per hour. After 5 days you can increase your activity slowly as tolerated. Walk with your walker as instructed. Use the walker until you are comfortable transitioning to a cane. Walk with the cane in the opposite hand of the operative leg. You may discontinue the cane once you are comfortable and walking steadily. Avoid periods of inactivity such as sitting longer than an hour when not asleep. This helps prevent blood clots.  You may discontinue the knee immobilizer once you are able to perform a straight leg raise while lying down. You may resume a sexual relationship  in one month or when given the OK by your doctor.  You may return to work once you are cleared by your doctor.  Do not drive a car for 6 weeks or until released by your surgeon.  Do not drive while taking narcotics.  TED HOSE STOCKINGS Wear the elastic stockings on both legs for three weeks following surgery during the day. You may remove them at night for sleeping.  WEIGHT  BEARING Weight bearing as tolerated with assist device (walker, cane, etc) as directed, use it as long as suggested by your surgeon or therapist, typically at least 4-6 weeks.  POSTOPERATIVE CONSTIPATION PROTOCOL Constipation - defined medically as fewer than three stools per week and severe constipation as less than one stool per week.  One of the most common issues patients have following surgery is constipation.  Even if you have a regular bowel pattern at home, your normal regimen is likely to be disrupted due to multiple reasons following surgery.  Combination of anesthesia, postoperative narcotics, change in appetite and fluid intake all can affect your bowels.  In order to avoid complications following surgery, here are some recommendations in order to help you during your recovery period.  Colace (docusate) - Pick up an over-the-counter form of Colace or another stool softener and take twice a day as long as you are requiring postoperative pain medications.  Take with a full glass of water daily.  If you experience loose stools or diarrhea, hold the colace until you stool forms back up. If your symptoms do not get better within 1 week or if they get worse, check with your doctor. Dulcolax (bisacodyl) - Pick up over-the-counter and take as directed by the product packaging as needed to assist with the movement of your bowels.  Take with a full glass of water.  Use this product as needed if not relieved by Colace only.  MiraLax (polyethylene glycol) - Pick up over-the-counter to have on hand. MiraLax is a solution that will increase the amount of water in your bowels to assist with bowel movements.  Take as directed and can mix with a glass of water, juice, soda, coffee, or tea. Take if you go more than two days without a movement. Do not use MiraLax more than once per day. Call your doctor if you are still constipated or irregular after using this medication for 7 days in a row.  If you continue  to have problems with postoperative constipation, please contact the office for further assistance and recommendations.  If you experience "the worst abdominal pain ever" or develop nausea or vomiting, please contact the office immediatly for further recommendations for treatment.  ITCHING If you experience itching with your medications, try taking only a single pain pill, or even half a pain pill at a time.  You can also use Benadryl over the counter for itching or also to help with sleep.   MEDICATIONS See your medication summary on the "After Visit Summary" that the nursing staff will review with you prior to discharge.  You may have some home medications which will be placed on hold until you complete the course of blood thinner medication.  It is important for you to complete the blood thinner medication as prescribed by your surgeon.  Continue your approved medications as instructed at time of discharge.  PRECAUTIONS If you experience chest pain or shortness of breath - call 911 immediately for transfer to the hospital emergency department.  If you develop a fever greater  that 101 F, purulent drainage from wound, increased redness or drainage from wound, foul odor from the wound/dressing, or calf pain - CONTACT YOUR SURGEON.                                                   FOLLOW-UP APPOINTMENTS Make sure you keep all of your appointments after your operation with your surgeon and caregivers. You should call the office at the above phone number and make an appointment for approximately two weeks after the date of your surgery or on the date instructed by your surgeon outlined in the "After Visit Summary".  RANGE OF MOTION AND STRENGTHENING EXERCISES  Rehabilitation of the knee is important following a knee injury or an operation. After just a few days of immobilization, the muscles of the thigh which control the knee become weakened and shrink (atrophy). Knee exercises are designed to build up  the tone and strength of the thigh muscles and to improve knee motion. Often times heat used for twenty to thirty minutes before working out will loosen up your tissues and help with improving the range of motion but do not use heat for the first two weeks following surgery. These exercises can be done on a training (exercise) mat, on the floor, on a table or on a bed. Use what ever works the best and is most comfortable for you Knee exercises include:  Leg Lifts - While your knee is still immobilized in a splint or cast, you can do straight leg raises. Lift the leg to 60 degrees, hold for 3 sec, and slowly lower the leg. Repeat 10-20 times 2-3 times daily. Perform this exercise against resistance later as your knee gets better.  Quad and Hamstring Sets - Tighten up the muscle on the front of the thigh (Quad) and hold for 5-10 sec. Repeat this 10-20 times hourly. Hamstring sets are done by pushing the foot backward against an object and holding for 5-10 sec. Repeat as with quad sets.  Leg Slides: Lying on your back, slowly slide your foot toward your buttocks, bending your knee up off the floor (only go as far as is comfortable). Then slowly slide your foot back down until your leg is flat on the floor again. Angel Wings: Lying on your back spread your legs to the side as far apart as you can without causing discomfort.  A rehabilitation program following serious knee injuries can speed recovery and prevent re-injury in the future due to weakened muscles. Contact your doctor or a physical therapist for more information on knee rehabilitation.   POST-OPERATIVE OPIOID TAPER INSTRUCTIONS: It is important to wean off of your opioid medication as soon as possible. If you do not need pain medication after your surgery it is ok to stop day one. Opioids include: Codeine, Hydrocodone(Norco, Vicodin), Oxycodone(Percocet, oxycontin) and hydromorphone amongst others.  Long term and even short term use of opiods can  cause: Increased pain response Dependence Constipation Depression Respiratory depression And more.  Withdrawal symptoms can include Flu like symptoms Nausea, vomiting And more Techniques to manage these symptoms Hydrate well Eat regular healthy meals Stay active Use relaxation techniques(deep breathing, meditating, yoga) Do Not substitute Alcohol to help with tapering If you have been on opioids for less than two weeks and do not have pain than it is ok to stop all  together.  Plan to wean off of opioids This plan should start within one week post op of your joint replacement. Maintain the same interval or time between taking each dose and first decrease the dose.  Cut the total daily intake of opioids by one tablet each day Next start to increase the time between doses. The last dose that should be eliminated is the evening dose.   IF YOU ARE TRANSFERRED TO A SKILLED REHAB FACILITY If the patient is transferred to a skilled rehab facility following release from the hospital, a list of the current medications will be sent to the facility for the patient to continue.  When discharged from the skilled rehab facility, please have the facility set up the patient's Home Health Physical Therapy prior to being released. Also, the skilled facility will be responsible for providing the patient with their medications at time of release from the facility to include their pain medication, the muscle relaxants, and their blood thinner medication. If the patient is still at the rehab facility at time of the two week follow up appointment, the skilled rehab facility will also need to assist the patient in arranging follow up appointment in our office and any transportation needs.  MAKE SURE YOU:  Understand these instructions.  Get help right away if you are not doing well or get worse.   DENTAL ANTIBIOTICS:  In most cases prophylactic antibiotics for Dental procdeures after total joint surgery are  not necessary.  Exceptions are as follows:  1. History of prior total joint infection  2. Severely immunocompromised (Organ Transplant, cancer chemotherapy, Rheumatoid biologic medications such as Humera)  3. Poorly controlled diabetes (A1C &gt; 8.0, blood glucose over 200)  If you have one of these conditions, contact your surgeon for an antibiotic prescription, prior to your dental procedure.    Pick up stool softner and laxative for home use following surgery while on pain medications. Do not submerge incision under water. Please use good hand washing techniques while changing dressing each day. May shower starting three days after surgery. Please use a clean towel to pat the incision dry following showers. Continue to use ice for pain and swelling after surgery. Do not use any lotions or creams on the incision until instructed by your surgeon.

## 2023-10-01 NOTE — Progress Notes (Signed)
 Orthopedic Tech Progress Note Patient Details:  [name] [name] Mar 11, 1951 [id number]  CPM Left Knee CPM Left Knee: Off Left Knee Flexion (Degrees): 40 Left Knee Extension (Degrees): 10  Post Interventions Patient Tolerated: Well  Shenouda Genova A Jazyah Butsch 10/01/2023, 1:28 PM

## 2023-10-01 NOTE — Plan of Care (Signed)
  Problem: Activity: Goal: Risk for activity intolerance will decrease Outcome: Progressing   Problem: Pain Management: Goal: General experience of comfort will improve Outcome: Progressing   Problem: Safety: Goal: Ability to remain free from injury will improve Outcome: Progressing

## 2023-10-01 NOTE — Transfer of Care (Signed)
 Immediate Anesthesia Transfer of Care Note  Patient: [name] [name]  Procedure(s) Performed: TOTAL KNEE ARTHROPLASTY (Left: Knee)  Patient Location: PACU  Anesthesia Type:General and Regional  Level of Consciousness: awake, alert , and oriented  Airway & Oxygen Therapy: Patient Spontanous Breathing and Patient connected to face mask oxygen  Post-op Assessment: Report given to RN and Post -op Vital signs reviewed and stable  Post vital signs: Reviewed and stable  Last Vitals:  Vitals Value Taken Time  BP 126/69 10/01/23 0906  Temp    Pulse 71 10/01/23 0907  Resp 15 10/01/23 0907  SpO2 95 % 10/01/23 0907  Vitals shown include unfiled device data.  Last Pain:  Vitals:   10/01/23 0556  TempSrc: Oral  PainSc:          Complications: No notable events documented.

## 2023-10-01 NOTE — Plan of Care (Signed)
  Problem: Education: Goal: Knowledge of General Education information will improve Description: Including pain rating scale, medication(s)/side effects and non-pharmacologic comfort measures Outcome: Progressing   Problem: Health Behavior/Discharge Planning: Goal: Ability to manage health-related needs will improve Outcome: Progressing   Problem: Clinical Measurements: Goal: Ability to maintain clinical measurements within normal limits will improve Outcome: Progressing Goal: Will remain free from infection Outcome: Progressing Goal: Diagnostic test results will improve Outcome: Progressing Goal: Respiratory complications will improve Outcome: Progressing Goal: Cardiovascular complication will be avoided Outcome: Progressing   Problem: Activity: Goal: Risk for activity intolerance will decrease Outcome: Adequate for Discharge   Problem: Nutrition: Goal: Adequate nutrition will be maintained Outcome: Completed/Met   Problem: Coping: Goal: Level of anxiety will decrease Outcome: Progressing   Problem: Elimination: Goal: Will not experience complications related to bowel motility Outcome: Progressing Goal: Will not experience complications related to urinary retention Outcome: Completed/Met   Problem: Pain Management: Goal: General experience of comfort will improve Outcome: Progressing   Problem: Safety: Goal: Ability to remain free from injury will improve Outcome: Progressing   Problem: Skin Integrity: Goal: Risk for impaired skin integrity will decrease Outcome: Completed/Met   Problem: Education: Goal: Knowledge of the prescribed therapeutic regimen will improve Outcome: Progressing Goal: Individualized Educational Video(s) Outcome: Completed/Met   Problem: Activity: Goal: Ability to avoid complications of mobility impairment will improve Outcome: Progressing   Problem: Clinical Measurements: Goal: Postoperative complications will be avoided or  minimized Outcome: Progressing   Problem: Pain Management: Goal: Pain level will decrease with appropriate interventions Outcome: Adequate for Discharge   Problem: Skin Integrity: Goal: Will show signs of wound healing Outcome: Progressing

## 2023-10-01 NOTE — Care Plan (Signed)
 Ortho Bundle Case Management Note  Patient Details  Name: [name] [name] MRN: [id number] Date of Birth: 1951-06-22                  L TKA on 10-01-23  DCP: Home with wife  DME: RW ordered through [employer].  PT: EO on 10-04-23   DME Arranged:  Walker rolling DME Agency:  [employer]    Additional Comments: Please contact me with any questions of if this plan should need to change.  Kate DELENA Kraft, RN,CCM [facility]  [phone] 10/01/2023, 12:26 PM

## 2023-10-01 NOTE — Evaluation (Signed)
 Physical Therapy Evaluation Patient Details Name: [name] [name] MRN: [id number] DOB: 1951-03-09 Today's Date: 10/01/2023  History of Present Illness  73 yo male s/P LTKA on 10/01/23.SABRA PMH: Bil TSA,  back surgery, RTKA  Clinical Impression  The patient  reports 7/10 Pian, speech is slurred, reports from medication.  Patient ambulated x 50' using Rw. Patient should progress to Dc home.        If plan is discharge home, recommend the following: A little help with walking and/or transfers;A little help with bathing/dressing/bathroom;Help with stairs or ramp for entrance;Assist for transportation;Assistance with cooking/housework   Can travel by private Tax Inspector (2 wheels)  Recommendations for Other Services       Functional Status Assessment Patient has had a recent decline in their functional status and demonstrates the ability to make significant improvements in function in a reasonable and predictable amount of time.     Precautions / Restrictions Precautions Precautions: Fall;Knee Required Braces or Orthoses: Knee Immobilizer - Left Knee Immobilizer - Left: Discontinue once straight leg raise with < 10 degree lag Restrictions Weight Bearing Restrictions Per Provider Order: No      Mobility  Bed Mobility Overal bed mobility: Needs Assistance Bed Mobility: Supine to Sit, Sit to Supine     Supine to sit: Min assist Sit to supine: Min assist   General bed mobility comments: assisted LLE ,    Transfers Overall transfer level: Needs assistance Equipment used: Rolling walker (2 wheels) Transfers: Sit to/from Stand Sit to Stand: Min assist           General transfer comment: cues for hand and LLE position    Ambulation/Gait Ambulation/Gait assistance: Min assist, +2 safety/equipment Gait Distance (Feet): 50 Feet Assistive device: Rolling walker (2 wheels) Gait Pattern/deviations: Step-to pattern, Step-through  pattern Gait velocity: decr     General Gait Details: cues for sequence  Stairs            Wheelchair Mobility     Tilt Bed    Modified Rankin (Stroke Patients Only)       Balance Overall balance assessment: Mild deficits observed, not formally tested                                           Pertinent Vitals/Pain Pain Assessment Pain Assessment: 0-10 Pain Score: 7  Pain Location: L knee Pain Descriptors / Indicators: Discomfort Pain Intervention(s): Ice applied, Premedicated before session, Monitored during session    Home Living Family/patient expects to be discharged to:: Private residence Living Arrangements: Spouse/significant other Available Help at Discharge: Family;Available 24 hours/day Type of Home: House Home Access: Stairs to enter Entrance Stairs-Rails: None Entrance Stairs-Number of Steps: 2-5   Home Layout: Able to live on main level with bedroom/bathroom;Two level Home Equipment: None      Prior Function Prior Level of Function : Independent/Modified Independent                     Extremity/Trunk Assessment   Upper Extremity Assessment Upper Extremity Assessment: Overall WFL for tasks assessed    Lower Extremity Assessment Lower Extremity Assessment: LLE deficits/detail LLE Deficits / Details: SLR with KI 6    Cervical / Trunk Assessment Cervical / Trunk Assessment: Normal  Communication   Communication Communication: No apparent difficulties  Cognition Arousal: Lethargic Behavior  During Therapy: WFL for tasks assessed/performed Overall Cognitive Status: Within Functional Limits for tasks assessed                                          General Comments      Exercises     Assessment/Plan    PT Assessment Patient needs continued PT services  PT Problem List Decreased strength;Decreased knowledge of precautions;Decreased range of motion;Decreased mobility;Decreased activity  tolerance;Pain       PT Treatment Interventions DME instruction;Therapeutic activities;Gait training;Therapeutic exercise;Patient/family education;Stair training;Functional mobility training    PT Goals (Current goals can be found in the Care Plan section)  Acute Rehab PT Goals Patient Stated Goal: go home PT Goal Formulation: With patient Time For Goal Achievement: 10/08/23 Potential to Achieve Goals: Good    Frequency 7X/week     Co-evaluation               AM-PAC PT 6 Clicks Mobility  Outcome Measure Help needed turning from your back to your side while in a flat bed without using bedrails?: A Little Help needed moving from lying on your back to sitting on the side of a flat bed without using bedrails?: A Little Help needed moving to and from a bed to a chair (including a wheelchair)?: A Little Help needed standing up from a chair using your arms (e.g., wheelchair or bedside chair)?: A Little Help needed to walk in hospital room?: A Little Help needed climbing 3-5 steps with a railing? : A Lot 6 Click Score: 17    End of Session Equipment Utilized During Treatment: Gait belt;Left knee immobilizer Activity Tolerance: Patient tolerated treatment well Patient left: in bed;with bed alarm set;with call bell/phone within reach Nurse Communication: Mobility status PT Visit Diagnosis: Unsteadiness on feet (R26.81);Difficulty in walking, not elsewhere classified (R26.2);Pain Pain - Right/Left: Left Pain - part of body: Knee    Time: 8397-8363 PT Time Calculation (min) (ACUTE ONLY): 34 min   Charges:   PT Evaluation $PT Eval Low Complexity: 1 Low PT Treatments $Gait Training: 8-22 mins PT General Charges $$ ACUTE PT VISIT: 1 Visit         Darice Potters PT Acute Rehabilitation Services Office [phone] Weekend pager-[phone]   Potters Darice Norris 10/01/2023, 4:47 PM

## 2023-10-01 NOTE — Anesthesia Procedure Notes (Signed)
 Procedure Name: LMA Insertion Date/Time: 10/01/2023 7:38 AM  Performed by: Jonus Coble D, CRNAPre-anesthesia Checklist: Patient identified, Emergency Drugs available, Suction available and Patient being monitored Patient Re-evaluated:Patient Re-evaluated prior to induction Oxygen Delivery Method: Circle system utilized Preoxygenation: Pre-oxygenation with 100% oxygen Induction Type: IV induction Ventilation: Mask ventilation without difficulty LMA: LMA inserted LMA Size: 4.0 Tube type: Oral Number of attempts: 1 Placement Confirmation: positive ETCO2 and breath sounds checked- equal and bilateral Tube secured with: Tape Dental Injury: Teeth and Oropharynx as per pre-operative assessment

## 2023-10-01 NOTE — Op Note (Signed)
 OPERATIVE REPORT-TOTAL KNEE ARTHROPLASTY   Pre-operative diagnosis- Osteoarthritis  Left knee(s)  Post-operative diagnosis- Osteoarthritis Left knee(s)  Procedure-  Left  Total Knee Arthroplasty  Surgeon- Dempsey GAILS. Bellatrix Devonshire, MD  Assistant- Corean Sender, PA-C   Anesthesia-  General and Regional  EBL- 25 ml   Drains None  Tourniquet time-  Total Tourniquet Time Documented: Thigh (Left) - 41 minutes Total: Thigh (Left) - 41 minutes     Complications- None  Condition-PACU - hemodynamically stable.   Brief Clinical Note   [name] [name] is a 73 y.o. 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 He has constant pain, with activity and at rest and significant functional deficits with difficulties even with ADLs. He has had extensive non-op management including analgesics, injections of cortisone and home exercise program, but remains in significant pain with significant dysfunction. Radiographs show bone on bone arthritis medial and patellofmeoral. He presents now for left Total Knee Arthroplasty.     Procedure in detail---   The patient is brought into the operating room and positioned supine on the operating table. After successful administration of  General and Regional,   a tourniquet is placed high on the  Left thigh(s) and the lower extremity is prepped and draped in the usual sterile fashion. Time out is performed by the operating team and then the  Left lower extremity is wrapped in Esmarch, knee flexed and the tourniquet inflated to 300 mmHg.       A midline incision is made with a ten blade through the subcutaneous tissue to the level of the extensor mechanism. A fresh blade is used to make a medial parapatellar arthrotomy. Soft tissue over the proximal medial tibia is subperiosteally elevated to the joint line with a knife and into the semimembranosus bursa with a Cobb elevator. Soft tissue over the proximal lateral  tibia is elevated with attention being paid to avoiding the patellar tendon on the tibial tubercle. The patella is everted, knee flexed 90 degrees and the ACL and PCL are removed. Findings are bone on bone medial and patellofemoral with large global osteophytes        The drill is used to create a starting hole in the distal femur and the canal is thoroughly irrigated with sterile saline to remove the fatty contents. The 5 degree Left  valgus alignment guide is placed into the femoral canal and the distal femoral cutting block is pinned to remove 9 mm off the distal femur. Resection is made with an oscillating saw.      The tibia is subluxed forward and the menisci are removed. The extramedullary alignment guide is placed referencing proximally at the medial aspect of the tibial tubercle and distally along the second metatarsal axis and tibial crest. The block is pinned to remove 2mm off the more deficient medial  side. Resection is made with an oscillating saw. Size 7is the most appropriate size for the tibia and the proximal tibia is prepared with the modular drill and keel punch for that size.      The femoral sizing guide is placed and size 7 is most appropriate. Rotation is marked off the epicondylar axis and confirmed by creating a rectangular flexion gap at 90 degrees. The size 7 cutting block is pinned in this rotation and the anterior, posterior and chamfer cuts are made with the oscillating saw. The intercondylar block is then placed and that cut is made.  Trial size 7 tibial component, trial size 7 posterior stabilized femur and a 7  mm posterior stabilized rotating platform insert trial is placed. Full extension is achieved with excellent varus/valgus and anterior/posterior balance throughout full range of motion. The patella is everted and thickness measured to be 27  mm. Free hand resection is taken to 15 mm, a 41 template is placed, lug holes are drilled, trial patella is placed, and it tracks  normally. Osteophytes are removed off the posterior femur with the trial in place. All trials are removed and the cut bone surfaces prepared with pulsatile lavage. Cement is mixed and once ready for implantation, the size 7 tibial implant, size  7 posterior stabilized femoral component, and the size 41 patella are cemented in place and the patella is held with the clamp. The trial insert is placed and the knee held in full extension. The Exparel  (20 ml mixed with 60 ml saline) is injected into the extensor mechanism, posterior capsule, medial and lateral gutters and subcutaneous tissues.  All extruded cement is removed and once the cement is hard the permanent 7 mm posterior stabilized rotating platform insert is placed into the tibial tray.      The wound is copiously irrigated with saline solution and the extensor mechanism closed with # 0 Stratofix suture. The tourniquet is released for a total tourniquet time of 41  minutes. Flexion against gravity is 140 degrees and the patella tracks normally. Subcutaneous tissue is closed with 2.0 vicryl and subcuticular with running 4.0 Monocryl. The incision is cleaned and dried and steri-strips and a bulky sterile dressing are applied. The limb is placed into a knee immobilizer and the patient is awakened and transported to recovery in stable condition.      Please note that a surgical assistant was a medical necessity for this procedure in order to perform it in a safe and expeditious manner. Surgical assistant was necessary to retract the ligaments and vital neurovascular structures to prevent injury to them and also necessary for proper positioning of the limb to allow for anatomic placement of the prosthesis.   [name], MD    10/01/2023, 8:39 AM

## 2023-10-01 NOTE — Anesthesia Procedure Notes (Signed)
 Anesthesia Regional Block: Adductor canal block   Pre-Anesthetic Checklist: , timeout performed,  Correct Patient, Correct Site, Correct Laterality,  Correct Procedure, Correct Position, site marked,  Risks and benefits discussed,  Surgical consent,  Pre-op evaluation,  At surgeon's request and post-op pain management  Laterality: Left  Prep: chloraprep       Needles:  Injection technique: Single-shot  Needle Type: Echogenic Stimulator Needle     Needle Length: 9cm  Needle Gauge: 21     Additional Needles:   Procedures:,,,, ultrasound used (permanent image in chart),,    Narrative:  Start time: 10/01/2023 6:35 AM End time: 10/01/2023 6:40 AM Injection made incrementally with aspirations every 5 mL.  Performed by: Personally  Anesthesiologist: Erma Thom SAUNDERS, MD  Additional Notes: Discussed risks and benefits of the nerve block in detail, including but not limited vascular injury, permanent nerve damage and infection.   Patient tolerated the procedure well. Local anesthetic introduced in an incremental fashion under minimal resistance after negative aspirations. No paresthesias were elicited. After completion of the procedure, no acute issues were identified and patient continued to be monitored by RN.

## 2023-10-01 NOTE — Telephone Encounter (Signed)
 Patient dropped off document  PAP , to be filled out by provider. Patient requested to send it back via Call Patient to pick up within 7-days. Document is located in providers tray at front office.Please advise at [facility] [phone] or [phone].

## 2023-10-01 NOTE — Anesthesia Postprocedure Evaluation (Signed)
 Anesthesia Post Note  Patient: [name] [name]  Procedure(s) Performed: TOTAL KNEE ARTHROPLASTY (Left: Knee)     Patient location during evaluation: PACU Anesthesia Type: Regional, Spinal and MAC Level of consciousness: oriented and awake and alert Pain management: pain level controlled Vital Signs Assessment: post-procedure vital signs reviewed and stable Respiratory status: spontaneous breathing, respiratory function stable and patient connected to nasal cannula oxygen Cardiovascular status: blood pressure returned to baseline and stable Postop Assessment: no headache, no backache and no apparent nausea or vomiting Anesthetic complications: no   No notable events documented.  Last Vitals:  Vitals:   10/01/23 1015 10/01/23 1053  BP: 127/80 121/67  Pulse: 76 87  Resp: (!) 9 20  Temp:  36.6 C  SpO2: 96% 96%    Last Pain:  Vitals:   10/01/23 1134  TempSrc:   PainSc: 10-Worst pain ever                 Thom JONELLE Peoples

## 2023-10-01 NOTE — Interval H&P Note (Signed)
 History and Physical Interval Note:  10/01/2023 6:34 AM  [name] [name]  has presented today for surgery, with the diagnosis of left knee osteoarthritis.  The various methods of treatment have been discussed with the patient and family. After consideration of risks, benefits and other options for treatment, the patient has consented to  Procedure(s): TOTAL KNEE ARTHROPLASTY (Left) as a surgical intervention.  The patient's history has been reviewed, patient examined, no change in status, stable for surgery.  I have reviewed the patient's chart and labs.  Questions were answered to the patient's satisfaction.     Dempsey Maryellen Dowdle

## 2023-10-01 NOTE — Progress Notes (Signed)
   10/01/23 2204  BiPAP/CPAP/SIPAP  $ Non-Invasive Home Ventilator  Initial  BiPAP/CPAP/SIPAP Pt Type Adult  BiPAP/CPAP/SIPAP DREAMSTATIOND  Mask Type Nasal mask  Respiratory Rate 16 breaths/min  Flow Rate 2 lpm  Patient Home Equipment No (only his nasal mask and tubing from home)  Auto Titrate Yes (AUTOMODE, MIN4CM, MAX20CM H2O)  CPAP/SIPAP surface wiped down Yes  BiPAP/CPAP /SiPAP Vitals  Pulse Rate 79  Resp 16  SpO2 94 %

## 2023-10-02 ENCOUNTER — Telehealth: Payer: Self-pay | Admitting: Neurology

## 2023-10-02 ENCOUNTER — Encounter (HOSPITAL_COMMUNITY): Payer: Self-pay | Admitting: Orthopedic Surgery

## 2023-10-02 DIAGNOSIS — Z96612 Presence of left artificial shoulder joint: Secondary | ICD-10-CM | POA: Diagnosis not present

## 2023-10-02 DIAGNOSIS — Z7985 Long-term (current) use of injectable non-insulin antidiabetic drugs: Secondary | ICD-10-CM | POA: Diagnosis not present

## 2023-10-02 DIAGNOSIS — Z79899 Other long term (current) drug therapy: Secondary | ICD-10-CM | POA: Diagnosis not present

## 2023-10-02 DIAGNOSIS — E785 Hyperlipidemia, unspecified: Secondary | ICD-10-CM | POA: Diagnosis not present

## 2023-10-02 DIAGNOSIS — Z87442 Personal history of urinary calculi: Secondary | ICD-10-CM | POA: Diagnosis not present

## 2023-10-02 DIAGNOSIS — Z85828 Personal history of other malignant neoplasm of skin: Secondary | ICD-10-CM | POA: Diagnosis not present

## 2023-10-02 DIAGNOSIS — E119 Type 2 diabetes mellitus without complications: Secondary | ICD-10-CM | POA: Diagnosis not present

## 2023-10-02 DIAGNOSIS — Z96652 Presence of left artificial knee joint: Secondary | ICD-10-CM | POA: Diagnosis not present

## 2023-10-02 DIAGNOSIS — Z96611 Presence of right artificial shoulder joint: Secondary | ICD-10-CM | POA: Diagnosis not present

## 2023-10-02 DIAGNOSIS — M5030 Other cervical disc degeneration, unspecified cervical region: Secondary | ICD-10-CM | POA: Diagnosis not present

## 2023-10-02 DIAGNOSIS — G4730 Sleep apnea, unspecified: Secondary | ICD-10-CM | POA: Diagnosis not present

## 2023-10-02 DIAGNOSIS — M1712 Unilateral primary osteoarthritis, left knee: Secondary | ICD-10-CM | POA: Diagnosis not present

## 2023-10-02 DIAGNOSIS — Z96651 Presence of right artificial knee joint: Secondary | ICD-10-CM | POA: Diagnosis not present

## 2023-10-02 DIAGNOSIS — I10 Essential (primary) hypertension: Secondary | ICD-10-CM | POA: Diagnosis not present

## 2023-10-02 LAB — BASIC METABOLIC PANEL
Anion gap: 10 (ref 5–15)
BUN: 22 mg/dL (ref 8–23)
CO2: 22 mmol/L (ref 22–32)
Calcium: 8.7 mg/dL — ABNORMAL LOW (ref 8.9–10.3)
Chloride: 101 mmol/L (ref 98–111)
Creatinine, Ser: 1.03 mg/dL (ref 0.61–1.24)
GFR, Estimated: >60 mL/min (ref >60)
Glucose, Bld: 117 mg/dL — ABNORMAL HIGH (ref 70–99)
Potassium: 4.7 mmol/L (ref 3.5–5.1)
Sodium: 133 mmol/L — ABNORMAL LOW (ref 135–145)

## 2023-10-02 LAB — CBC
HCT: 42.4 % (ref 39.0–52.0)
Hemoglobin: 14.4 g/dL (ref 13.0–17.0)
MCH: 33 pg (ref 26.0–34.0)
MCHC: 34 g/dL (ref 30.0–36.0)
MCV: 97 fL (ref 80.0–100.0)
Platelets: 210 10*3/uL (ref 150–400)
RBC: 4.37 MIL/uL (ref 4.22–5.81)
RDW: 13 % (ref 11.5–15.5)
WBC: 12.1 10*3/uL — ABNORMAL HIGH (ref 4.0–10.5)
nRBC: 0 % (ref 0.0–0.2)

## 2023-10-02 MED ORDER — HYDROMORPHONE HCL 2 MG PO TABS: 2.0000 mg | ORAL_TABLET | Freq: Four times a day (QID) | ORAL | 0 refills | Status: AC | PRN

## 2023-10-02 MED ORDER — ASPIRIN 81 MG PO CHEW: 81.0000 mg | CHEWABLE_TABLET | Freq: Two times a day (BID) | ORAL | 0 refills | Status: AC

## 2023-10-02 MED ORDER — ONDANSETRON HCL 4 MG PO TABS: 4.0000 mg | ORAL_TABLET | Freq: Four times a day (QID) | ORAL | 0 refills | Status: AC | PRN

## 2023-10-02 MED ORDER — TIZANIDINE HCL 4 MG PO TABS: 4.0000 mg | ORAL_TABLET | Freq: Four times a day (QID) | ORAL | 0 refills | Status: AC | PRN

## 2023-10-02 NOTE — Progress Notes (Signed)
 Subjective: 1 Day Post-Op Procedure(s) (LRB): TOTAL KNEE ARTHROPLASTY (Left) Patient reports pain as mild.   Patient seen in rounds by Dr. Melodi. Patient is well, and has had no acute complaints or problems No issues overnight. Denies chest pain, SOB, or calf pain. Foley catheter removed this AM.  We will continue therapy today, ambulated 50' yesterday.   Objective: Vital signs in last 24 hours: Temp:  [97.4 F (36.3 C)-97.9 F (36.6 C)] 97.8 F (36.6 C) (01/07 0528) Pulse Rate:  [63-87] 64 (01/07 0528) Resp:  [9-20] 18 (01/07 0528) BP: (99-139)/(56-82) 127/68 (01/07 0528) SpO2:  [92 %-99 %] 96 % (01/07 0528)  Intake/Output from previous day:  Intake/Output Summary (Last 24 hours) at 10/02/2023 0845 Last data filed at 10/02/2023 0700 Gross per 24 hour  Intake 2240 ml  Output 2650 ml  Net -410 ml     Intake/Output this shift: No intake/output data recorded.  Labs: Recent Labs    10/02/23 0327  HGB 14.4   Recent Labs    10/02/23 0327  WBC 12.1*  RBC 4.37  HCT 42.4  PLT 210   Recent Labs    10/02/23 0327  NA 133*  K 4.7  CL 101  CO2 22  BUN 22  CREATININE 1.03  GLUCOSE 117*  CALCIUM 8.7*   No results for input(s): LABPT, INR in the last 72 hours.  Exam: General - Patient is Alert and Oriented Extremity - Neurologically intact Neurovascular intact Sensation intact distally Dorsiflexion/Plantar flexion intact Dressing - dressing C/D/I Motor Function - intact, moving foot and toes well on exam.   Past Medical History:  Diagnosis Date   ALLERGIC RHINITIS 04/22/2007   Allergy    Cancer (HCC)    HX SKIN CANCER   Complication of anesthesia    more concerned about positioning of head & neck because of TMJ   DDD (degenerative disc disease), cervical    Headache(784.0)    migraines   History of kidney stones    History of skin cancer    History of TMJ disorder    HYPERCHOLESTEROLEMIA 04/22/2007   HYPERLIPIDEMIA 07/29/2007   Hyperplastic  colon polyp    NEPHROLITHIASIS, HX OF 07/29/2007   Osteoarth NOS-Unspec 04/22/2007   Pneumonia    Shoulder pain, acute    bilateral   Sleep apnea    cpap    Assessment/Plan: 1 Day Post-Op Procedure(s) (LRB): TOTAL KNEE ARTHROPLASTY (Left) Principal Problem:   OA (osteoarthritis) of knee  Estimated body mass index is 36.92 kg/m as calculated from the following:   Height as of this encounter: 5' 9 (1.753 m).   Weight as of this encounter: 113.4 kg. Advance diet Up with therapy D/C IV fluids   Patient's anticipated LOS is less than 2 midnights, meeting these requirements: - Lives within 1 hour of care - Has a competent adult at home to recover with post-op - NO history of             - Chronic pain requiring opiods             - Diabetes             - Coronary Artery Disease             - Heart failure             - Heart attack             - Stroke             -  DVT/VTE             - Cardiac arrhythmia             - Respiratory Failure/COPD             - Renal failure             - Anemia             - Advanced Liver disease     DVT Prophylaxis - Aspirin  Weight bearing as tolerated. Continue therapy.  Plan is to go Home after hospital stay. Plan for discharge later today if progresses with therapy and meeting goals. Scheduled [facility] OPP[facility] at [facility]). Follow-up in the office in 2 weeks.  The PDMP database was reviewed today prior to any opioid medications being prescribed to this patient.  Roxie Mess, PA-C Orthopedic Surgery [phone] 10/02/2023, 8:45 AM

## 2023-10-02 NOTE — Progress Notes (Signed)
 Physical Therapy Treatment Patient Details Name: [name] [name] MRN: [id number] DOB: October 01, 1950 Today's Date: 10/02/2023   History of Present Illness 73 yo male s/P LTKA. PMH: Bil TSA,  back surgery, RTKA    PT Comments  POD # 1 am session Pt AxO x 3 pleasant and motivated.  Plans to D/C to home later today with Spouse.  Pt had other knee replaced but that was years ago, stated pt. Assisted OOB to amb in hallway Then returned to room to perform some TE's following HEP handout.  Instructed on proper tech, freq as well as use of ICE.   Pt will need another PT session later today to practice stairs and complete HEP Education.    If plan is discharge home, recommend the following: A little help with walking and/or transfers;A little help with bathing/dressing/bathroom;Help with stairs or ramp for entrance;Assist for transportation;Assistance with cooking/housework   Can travel by Training And Development Officer (2 wheels)    Recommendations for Other Services       Precautions / Restrictions Precautions Precautions: Fall;Knee Precaution Comments: no pillow under knee Restrictions Weight Bearing Restrictions Per Provider Order: No Other Position/Activity Restrictions: WBAT     Mobility  Bed Mobility Overal bed mobility: Needs Assistance Bed Mobility: Supine to Sit     Supine to sit: Supervision, Contact guard     General bed mobility comments: demonstarted and instructed how to use belt to self assist LE.  Required increased time.    Transfers Overall transfer level: Needs assistance Equipment used: Rolling walker (2 wheels) Transfers: Sit to/from Stand Sit to Stand: Contact guard assist, Supervision           General transfer comment: cues for hand and LLE position    Ambulation/Gait Ambulation/Gait assistance: Min assist, Contact guard assist Gait Distance (Feet): 65 Feet Assistive device: Rolling walker (2 wheels) Gait  Pattern/deviations: Step-to pattern, Step-through pattern Gait velocity: decr     General Gait Details: cues for sequence tolerated an increased distance   Stairs             Wheelchair Mobility     Tilt Bed    Modified Rankin (Stroke Patients Only)       Balance                                            Cognition Arousal: Alert Behavior During Therapy: WFL for tasks assessed/performed Overall Cognitive Status: Within Functional Limits for tasks assessed                                 General Comments: AxO x 3 pleasant and motivated.  Had other TKR but was years ago.        Exercises  Total Knee Replacement TE's following HEP handout 10 reps B LE ankle pumps 05 reps towel squeezes 05 reps knee presses 05 reps heel slides  05 reps SAQ's 05 reps SLR's 05 reps ABD Educated on use of gait belt to assist with TE's Followed by ICE     General Comments        Pertinent Vitals/Pain Pain Assessment Pain Assessment: 0-10 Pain Score: 6  Pain Location: L knee Pain Descriptors / Indicators: Discomfort, Operative site guarding Pain Intervention(s): Monitored during session, Premedicated  before session, Repositioned, Ice applied    Home Living                          Prior Function            PT Goals (current goals can now be found in the care plan section) Progress towards PT goals: Progressing toward goals    Frequency    7X/week      PT Plan      Co-evaluation              AM-PAC PT 6 Clicks Mobility   Outcome Measure  Help needed turning from your back to your side while in a flat bed without using bedrails?: A Little Help needed moving from lying on your back to sitting on the side of a flat bed without using bedrails?: A Little Help needed moving to and from a bed to a chair (including a wheelchair)?: A Little Help needed standing up from a chair using your arms (e.g.,  wheelchair or bedside chair)?: A Little Help needed to walk in hospital room?: A Little Help needed climbing 3-5 steps with a railing? : A Little 6 Click Score: 18    End of Session Equipment Utilized During Treatment: Gait belt Activity Tolerance: Patient tolerated treatment well Patient left: in chair;with call bell/phone within reach;with family/visitor present Nurse Communication: Mobility status PT Visit Diagnosis: Unsteadiness on feet (R26.81);Difficulty in walking, not elsewhere classified (R26.2);Pain Pain - Right/Left: Left Pain - part of body: Knee     Time: 8891-8853 PT Time Calculation (min) (ACUTE ONLY): 38 min  Charges:    $Gait Training: 8-22 mins $Therapeutic Exercise: 8-22 mins $Therapeutic Activity: 8-22 mins PT General Charges $$ ACUTE PT VISIT: 1 Visit                    Katheryn Leap  PTA Acute  Rehabilitation Services Office M-F          [phone]

## 2023-10-02 NOTE — Care Management Obs Status (Signed)
 MEDICARE OBSERVATION STATUS NOTIFICATION   Patient Details  Name: [name] [name] MRN: [id number] Date of Birth: 04-Aug-1951   Medicare Observation Status Notification Given:  [name], LCSW 10/02/2023, 12:35 PM

## 2023-10-02 NOTE — Telephone Encounter (Signed)
 Diane with [facility] has questions regarding Rx sent for amitriptyline  (ELAVIL ) tablet 100 mg    Asking if Dr. Is aware that pt had a allergy to Cyclobenzaprine  which may cause a reaction  Also states that the dosage is considered a high dosage for pt's age, recommending a lower dosage  Requesting a call back to [phone] Reference # [id number]

## 2023-10-02 NOTE — Progress Notes (Signed)
 Physical Therapy Treatment Patient Details Name: [name] [name] MRN: [id number] DOB: [birth year] Today's Date: 10/02/2023   History of Present Illness 73 yo male s/P LTKA. PMH: Bil TSA,  back surgery, RTKA    PT Comments  POD # 2 pm session Assisted OOB to amb in hallway then practice stairs.  General stair comments: first up 2 steps no rails (garage) with Spouse hands on assist securing walker.  Then up multiple steps using one rail and opposite cane.  B/B upstairs. Reviewed HEP. Educated on proper sequencing stepping into shower backward with good first, then stepping out with bad first.  Addressed all mobility questions, discussed appropriate activity, educated on use of ICE.  Pt ready for D/C to home.    If plan is discharge home, recommend the following: A little help with walking and/or transfers;A little help with bathing/dressing/bathroom;Help with stairs or ramp for entrance;Assist for transportation;Assistance with cooking/housework   Can travel by Training And Development Officer (2 wheels)    Recommendations for Other Services       Precautions / Restrictions Precautions Precautions: Fall;Knee Precaution Comments: no pillow under knee Restrictions Weight Bearing Restrictions Per Provider Order: No Other Position/Activity Restrictions: WBAT     Mobility  Bed Mobility Overal bed mobility: Needs Assistance Bed Mobility: Supine to Sit     Supine to sit: Supervision, Contact guard     General bed mobility comments: demonstarted and instructed how to use belt to self assist LE.  Required increased time.    Transfers Overall transfer level: Needs assistance Equipment used: Rolling walker (2 wheels) Transfers: Sit to/from Stand Sit to Stand: Supervision           General transfer comment: cues for hand and LLE position with increased time    Ambulation/Gait Ambulation/Gait assistance: Supervision, Contact guard  assist Gait Distance (Feet): 25 Feet Assistive device: Rolling walker (2 wheels) Gait Pattern/deviations: Step-to pattern, Step-through pattern Gait velocity: decr     General Gait Details: decreased amb distance as session focus was stairs   Stairs Stairs: Yes Stairs assistance: Min assist, Contact guard assist, Supervision Stair Management: No rails, One rail Left, Step to pattern, Forwards, With walker Number of Stairs: 4 General stair comments: first up 2 steps no rails (garage) with Spouse hands on assist securing walker.  Then up multiple steps using one rail and opposite cane.  B/B upstairs.   Wheelchair Mobility     Tilt Bed    Modified Rankin (Stroke Patients Only)       Balance                                            Cognition Arousal: Alert Behavior During Therapy: WFL for tasks assessed/performed Overall Cognitive Status: Within Functional Limits for tasks assessed                                 General Comments: AxO x 3 pleasant and motivated.  Had other TKR but was years ago.        Exercises      General Comments        Pertinent Vitals/Pain Pain Assessment Pain Assessment: 0-10 Pain Score: 6  Pain Location: L knee Pain Descriptors / Indicators: Discomfort, Operative site guarding Pain Intervention(s):  Monitored during session, Premedicated before session, Repositioned, Ice applied    Home Living                          Prior Function            PT Goals (current goals can now be found in the care plan section) Progress towards PT goals: Progressing toward goals    Frequency    7X/week      PT Plan      Co-evaluation              AM-PAC PT 6 Clicks Mobility   Outcome Measure  Help needed turning from your back to your side while in a flat bed without using bedrails?: A Little Help needed moving from lying on your back to sitting on the side of a flat bed  without using bedrails?: A Little Help needed moving to and from a bed to a chair (including a wheelchair)?: A Little Help needed standing up from a chair using your arms (e.g., wheelchair or bedside chair)?: A Little Help needed to walk in hospital room?: A Little Help needed climbing 3-5 steps with a railing? : A Little 6 Click Score: 18    End of Session Equipment Utilized During Treatment: Gait belt Activity Tolerance: Patient tolerated treatment well Patient left: in chair;with call bell/phone within reach;with family/visitor present Nurse Communication: Mobility status PT Visit Diagnosis: Unsteadiness on feet (R26.81);Difficulty in walking, not elsewhere classified (R26.2);Pain Pain - Right/Left: Left Pain - part of body: Knee     Time: 8681-8655 PT Time Calculation (min) (ACUTE ONLY): 26 min  Charges:    $Gait Training: 8-22 mins $Therapeutic Exercise: 8-22 mins $Therapeutic Activity: 8-22 mins PT General Charges $$ ACUTE PT VISIT: 1 Visit                     Katheryn Leap  PTA Acute  Rehabilitation Services Office M-F          [phone]

## 2023-10-02 NOTE — Telephone Encounter (Signed)
 I called [name] [name] back with [facility], I relayed pt has been on amitriptyline 100mg  po at bedtime per last 12-08-2022 visit.  To continue per MM/NP.  He made note of this.  Will get out to pt.

## 2023-10-02 NOTE — TOC Transition Note (Signed)
 Transition of Care [facility]) - Discharge Note   Patient Details  Name: JOBIN MONTELONGO MRN: [id number] Date of Birth: 02/10/1951  Transition of Care [facility]) CM/SW Contact:  NORMAN ASPEN, LCSW Phone Number: 10/02/2023, 10:38 AM   Clinical Narrative:     Met with pt who confirms OPPT already arranged with [facility].  RW has been delivered to room via [employer].  No further TOC needs.  Final next level of care: OP Rehab Barriers to Discharge: No Barriers Identified   Patient Goals and CMS Choice Patient states their goals for this hospitalization and ongoing recovery are:: return home          Discharge Placement                       Discharge Plan and Services Additional resources added to the After Visit Summary for                  DME Arranged: Walker rolling DME Agency: [employer]                  Social Drivers of Health (SDOH) Interventions SDOH Screenings   Food Insecurity: No Food Insecurity (10/01/2023)  Housing: Low Risk  (10/01/2023)  Transportation Needs: No Transportation Needs (10/01/2023)  Utilities: Not At Risk (10/01/2023)  Alcohol Screen: Low Risk  (09/05/2022)  Depression (PHQ2-9): Low Risk  (07/02/2023)  Financial Resource Strain: Low Risk  (06/30/2023)  Physical Activity: Sufficiently Active (06/30/2023)  Social Connections: Socially Integrated (10/01/2023)  Stress: No Stress Concern Present (06/30/2023)  Tobacco Use: Low Risk  (10/01/2023)     Readmission Risk Interventions     No data to display

## 2023-10-04 ENCOUNTER — Telehealth: Payer: Self-pay | Admitting: *Deleted

## 2023-10-04 ENCOUNTER — Ambulatory Visit (HOSPITAL_BASED_OUTPATIENT_CLINIC_OR_DEPARTMENT_OTHER): Payer: Medicare HMO | Admitting: Physical Therapy

## 2023-10-04 DIAGNOSIS — M25562 Pain in left knee: Secondary | ICD-10-CM | POA: Diagnosis not present

## 2023-10-04 NOTE — Telephone Encounter (Signed)
 Noted.

## 2023-10-04 NOTE — Telephone Encounter (Signed)
 Copied from CRM [id number]. Topic: Clinical - Prescription Issue >> Oct 04, 2023 12:26 PM Farrel B wrote: Reason for CRM: Patient's spouse requesting a call back to advise her on the status of the paperwork she dropped to get assistance with prescription she states its becoming an urgent matter and she needs to speak with someone as soon as possible [phone]

## 2023-10-05 ENCOUNTER — Encounter: Payer: Self-pay | Admitting: Adult Health

## 2023-10-08 ENCOUNTER — Other Ambulatory Visit: Payer: Self-pay | Admitting: Family Medicine

## 2023-10-08 ENCOUNTER — Telehealth: Payer: Self-pay

## 2023-10-08 ENCOUNTER — Encounter (HOSPITAL_BASED_OUTPATIENT_CLINIC_OR_DEPARTMENT_OTHER): Payer: Medicare HMO | Admitting: Physical Therapy

## 2023-10-08 DIAGNOSIS — E1169 Type 2 diabetes mellitus with other specified complication: Secondary | ICD-10-CM

## 2023-10-08 DIAGNOSIS — E66812 Obesity, class 2: Secondary | ICD-10-CM

## 2023-10-08 DIAGNOSIS — M25562 Pain in left knee: Secondary | ICD-10-CM | POA: Diagnosis not present

## 2023-10-08 MED ORDER — SEMAGLUTIDE (1 MG/DOSE) 4 MG/3ML ~~LOC~~ SOPN: 1.0000 mg | PEN_INJECTOR | SUBCUTANEOUS | 3 refills | Status: AC

## 2023-10-08 NOTE — Telephone Encounter (Signed)
 Gabapentin refill history per epic:

## 2023-10-08 NOTE — Progress Notes (Signed)
   10/08/2023  Patient ID: [name] [name], male   DOB: 09-26-1950, 73 y.o.   MRN: [id number]  Patient dropped off paperwork for Ajovy  assistance through [employer] for Dr. Mercer signature. Upon review, Duwaine Russell at [facility] prescribes for patient. Spoke with Dr. Mercer and confirmed she is not the provider whose signature is needed at this time.  Contacted patient and spoke with [name] [name] to inform her that paperwork would need to be signed by provider at [facility]. Placed in front office file cabinet for pickup at their convenience.  [name] [name], PharmD Clinical Pharmacist [phone]

## 2023-10-09 ENCOUNTER — Encounter: Payer: Self-pay | Admitting: Family Medicine

## 2023-10-09 NOTE — Telephone Encounter (Signed)
 [employer] application received from patient. It is pending Megan NP's signature.

## 2023-10-10 ENCOUNTER — Telehealth: Payer: Self-pay

## 2023-10-10 ENCOUNTER — Other Ambulatory Visit (HOSPITAL_COMMUNITY): Payer: Self-pay

## 2023-10-10 DIAGNOSIS — M25562 Pain in left knee: Secondary | ICD-10-CM | POA: Diagnosis not present

## 2023-10-10 NOTE — Discharge Summary (Signed)
 Patient ID: [name] [name] MRN: [id number] DOB/AGE: 27-Jun-1951 73 y.o.  Admit date: 10/01/2023 Discharge date: 10/02/2023  Admission Diagnoses:  Principal Problem:   OA (osteoarthritis) of knee   Discharge Diagnoses:  Same  Past Medical History:  Diagnosis Date   ALLERGIC RHINITIS 04/22/2007   Allergy    Cancer (HCC)    HX SKIN CANCER   Complication of anesthesia    more concerned about positioning of head & neck because of TMJ   DDD (degenerative disc disease), cervical    Headache(784.0)    migraines   History of kidney stones    History of skin cancer    History of TMJ disorder    HYPERCHOLESTEROLEMIA 04/22/2007   HYPERLIPIDEMIA 07/29/2007   Hyperplastic colon polyp    NEPHROLITHIASIS, HX OF 07/29/2007   Osteoarth NOS-Unspec 04/22/2007   Pneumonia    Shoulder pain, acute    bilateral   Sleep apnea    cpap    Surgeries: Procedure(s): TOTAL KNEE ARTHROPLASTY on 10/01/2023   Consultants:   Discharged Condition: Improved  Hospital Course: [name] [name] is an 73 y.o. male who was admitted 10/01/2023 for operative treatment ofOA (osteoarthritis) of knee. Patient has severe unremitting pain that affects sleep, daily activities, and work/hobbies. After pre-op clearance the patient was taken to the operating room on 10/01/2023 and underwent  Procedure(s): TOTAL KNEE ARTHROPLASTY.    Patient was given perioperative antibiotics:  Anti-infectives (From admission, onward)    Start     Dose/Rate Route Frequency Ordered Stop   10/01/23 1300  ceFAZolin  (ANCEF ) IVPB 2g/100 mL premix        2 g 200 mL/hr over 30 Minutes Intravenous Every 6 hours 10/01/23 1041 10/01/23 2200   10/01/23 0600  ceFAZolin  (ANCEF ) IVPB 2g/100 mL premix        2 g 200 mL/hr over 30 Minutes Intravenous On call to O.R. 10/01/23 0530 10/01/23 0739        Patient was given sequential compression devices, early ambulation, and chemoprophylaxis to prevent DVT.  Patient benefited maximally from hospital  stay and there were no complications.    Recent vital signs: No data found.   Recent laboratory studies: No results for input(s): WBC, HGB, HCT, PLT, NA, K, CL, CO2, BUN, CREATININE, GLUCOSE, INR, CALCIUM in the last 72 hours.  Invalid input(s): PT, 2   Discharge Medications:   Allergies as of 10/02/2023       Reactions   Shrimp [shellfish Allergy] Anaphylaxis, Rash   Codeine Other (See Comments), Rash   Blacked out   Contrast Media [iodinated Contrast Media] Rash   Diazepam  Rash   Rash over whole body from neck down, and he lost his voice (airway compromise?)   Amoxicillin Other (See Comments)   Unknown   Benadryl [diphenhydramine]    With dyes    Cyclobenzaprine  Other (See Comments)   Depakote  [divalproex  Sodium] Swelling   Per pts report (given by Dr. Jannine).  Has throat swelling and rash.   Diphenhydramine Hcl Other (See Comments)   Other Reaction(s): with dye   Erythromycin Other (See Comments)   unknown   Meperidine Hcl Other (See Comments)   Penicillin G Other (See Comments)   Povidone-iodine  Other (See Comments)   Shrimp Flavor Agent (non-screening) Other (See Comments)   Allegra [fexofenadine] Rash      Betadine  [povidone Iodine ] Rash   Celecoxib Rash, Other (See Comments)   Clopidogrel Rash, Other (See Comments)   Meperidine And Related Rash   Methocarbamol Rash,  Other (See Comments)   Neosporin [neomycin-bacitracin Zn-polymyx] Rash   Penicillins Rash   Rivaroxaban  Rash, Other (See Comments)   Rofecoxib Rash, Other (See Comments)        Medication List     STOP taking these medications    meloxicam 15 MG tablet Commonly known as: MOBIC   naproxen  500 MG tablet Commonly known as: NAPROSYN        TAKE these medications    acetaminophen  650 MG CR tablet Commonly known as: TYLENOL  Take 650-1,300 mg by mouth every 8 (eight) hours as needed for pain.   Ajovy  225 MG/1.5ML Soaj Generic drug:  Fremanezumab -vfrm Inject 225 mg into the skin every 30 (thirty) days.   allopurinol  100 MG tablet Commonly known as: ZYLOPRIM  Take 1 tablet (100 mg total) by mouth daily.   amitriptyline  100 MG tablet Commonly known as: ELAVIL  Take 1 tablet (100 mg total) by mouth at bedtime.   aspirin  81 MG chewable tablet Chew 1 tablet (81 mg total) by mouth 2 (two) times daily. Then take one 81 mg aspirin  once a day for three weeks. Then discontinue aspirin .   DULoxetine  60 MG capsule Commonly known as: CYMBALTA  Take 1 capsule (60 mg total) by mouth daily. What changed: when to take this   EPINEPHrine  0.3 mg/0.3 mL Soaj injection Commonly known as: EPI-PEN INJECT 0.3 MLS INTO THE MUSCLE ONCE   fluticasone  50 MCG/ACT nasal spray Commonly known as: FLONASE  Place 1 spray into both nostrils daily.   gabapentin  300 MG capsule Commonly known as: NEURONTIN  Take 1 capsule (300 mg total) by mouth at bedtime.   HYDROmorphone  2 MG tablet Commonly known as: DILAUDID  Take 1-2 tablets (2-4 mg total) by mouth every 6 (six) hours as needed for severe pain (pain score 7-10).   METAMUCIL PO Take 2 capsules by mouth at bedtime.   NON FORMULARY Pt uses a cpap nightly   ondansetron  4 MG tablet Commonly known as: ZOFRAN  Take 1 tablet (4 mg total) by mouth every 6 (six) hours as needed for nausea.   pravastatin  40 MG tablet Commonly known as: PRAVACHOL  Take 1 tablet (40 mg total) by mouth daily.   SUMAtriptan  100 MG tablet Commonly known as: IMITREX  TAKE 1 TABLET AS NEEDED FOR MIGRAINE. MAY REPEAT IN 2 HOURS IF HEADACHE PERSISTS OR RECURS. MAX 2 TABLETS IN 24 HOURS   tamsulosin  0.4 MG Caps capsule Commonly known as: Flomax  Take 1 capsule (0.4 mg total) by mouth daily. What changed: when to take this   tiZANidine  4 MG tablet Commonly known as: ZANAFLEX  Take 1 tablet (4 mg total) by mouth every 6 (six) hours as needed for muscle spasms. What changed: See the new instructions.                Discharge Care Instructions  (From admission, onward)           Start     Ordered   10/02/23 0000  Weight bearing as tolerated        10/02/23 0848   10/02/23 0000  Change dressing       Comments: You may remove the bulky bandage (ACE wrap and gauze) two days after surgery. You will have an adhesive waterproof bandage underneath. Leave this in place until your first follow-up appointment.   10/02/23 0848            Diagnostic Studies: No results found.  Disposition: Discharge disposition: 01-Home or Self Care       Discharge Instructions  Call MD / Call 911   Complete by: As directed    If you experience chest pain or shortness of breath, CALL 911 and be transported to the hospital emergency room.  If you develope a fever above 101 F, pus (white drainage) or increased drainage or redness at the wound, or calf pain, call your surgeon's office.   Change dressing   Complete by: As directed    You may remove the bulky bandage (ACE wrap and gauze) two days after surgery. You will have an adhesive waterproof bandage underneath. Leave this in place until your first follow-up appointment.   Constipation Prevention   Complete by: As directed    Drink plenty of fluids.  Prune juice may be helpful.  You may use a stool softener, such as Colace (over the counter) 100 mg twice a day.  Use MiraLax  (over the counter) for constipation as needed.   Diet - low sodium heart healthy   Complete by: As directed    Do not put a pillow under the knee. Place it under the heel.   Complete by: As directed    Driving restrictions   Complete by: As directed    No driving for two weeks   Post-operative opioid taper instructions:   Complete by: As directed    POST-OPERATIVE OPIOID TAPER INSTRUCTIONS: It is important to wean off of your opioid medication as soon as possible. If you do not need pain medication after your surgery it is ok to stop day one. Opioids include: Codeine,  Hydrocodone (Norco, Vicodin), Oxycodone (Percocet, oxycontin ) and hydromorphone  amongst others.  Long term and even short term use of opiods can cause: Increased pain response Dependence Constipation Depression Respiratory depression And more.  Withdrawal symptoms can include Flu like symptoms Nausea, vomiting And more Techniques to manage these symptoms Hydrate well Eat regular healthy meals Stay active Use relaxation techniques(deep breathing, meditating, yoga) Do Not substitute Alcohol to help with tapering If you have been on opioids for less than two weeks and do not have pain than it is ok to stop all together.  Plan to wean off of opioids This plan should start within one week post op of your joint replacement. Maintain the same interval or time between taking each dose and first decrease the dose.  Cut the total daily intake of opioids by one tablet each day Next start to increase the time between doses. The last dose that should be eliminated is the evening dose.      TED hose   Complete by: As directed    Use stockings (TED hose) for three weeks on both leg(s).  You may remove them at night for sleeping.   Weight bearing as tolerated   Complete by: As directed         Follow-up Information     Melodi Lerner, MD. Go on 10/16/2023.   Specialty: Orthopedic Surgery Why: You are scheduled for first pos top appt on Tuesday January 21 at 2:00pm. Contact information: [address] [place] [address] [phone]         [facility].. Go on 10/04/2023.   Why: You are scheduled for physical therapy evaluation on Thursday January 9 at 8:00am. Contact information: [address] [place] [address] [phone]                  Signed: Roxie Mess 10/10/2023, 11:18 AM

## 2023-10-10 NOTE — Telephone Encounter (Signed)
 PA request has been Submitted. New Encounter created for follow up. For additional info see Pharmacy Prior Auth telephone encounter from 10/10/23.

## 2023-10-10 NOTE — Telephone Encounter (Signed)
 Signed [employer] application faxed to [employer]. Received a receipt of confirmation. Fax number: [phone]

## 2023-10-10 NOTE — Telephone Encounter (Signed)
 Pharmacy Patient Advocate Encounter   Received notification from Pt Calls Messages that prior authorization for Ozempic  (1 MG/DOSE) 4MG /3ML pen-injectors is required/requested.   Insurance verification completed.   The patient is insured through [facility] .   Per test claim: PA required; PA submitted to above mentioned insurance via CoverMyMeds Key/confirmation #/EOC  [id number] Status is pending

## 2023-10-11 ENCOUNTER — Other Ambulatory Visit (HOSPITAL_COMMUNITY): Payer: Self-pay

## 2023-10-11 NOTE — Telephone Encounter (Signed)
Pharmacy Patient Advocate Encounter  Received notification from [facility] that Prior Authorization for Ozempic (1 MG/DOSE) 4MG /3ML pen-injectors  has been APPROVED from 10/10/23 to 10/09/24. Ran test claim, Copay is $45. This test claim was processed through [facility]- copay amounts may vary at other pharmacies due to pharmacy/plan contracts, or as the patient moves through the different stages of their insurance plan.   PA #/Case ID/Reference #: [id number]

## 2023-10-12 ENCOUNTER — Other Ambulatory Visit (HOSPITAL_COMMUNITY): Payer: Self-pay

## 2023-10-13 ENCOUNTER — Encounter (HOSPITAL_BASED_OUTPATIENT_CLINIC_OR_DEPARTMENT_OTHER): Payer: Medicare HMO | Admitting: Physical Therapy

## 2023-10-15 ENCOUNTER — Encounter: Payer: Self-pay | Admitting: Adult Health

## 2023-10-15 DIAGNOSIS — M25562 Pain in left knee: Secondary | ICD-10-CM | POA: Diagnosis not present

## 2023-10-18 ENCOUNTER — Encounter (HOSPITAL_BASED_OUTPATIENT_CLINIC_OR_DEPARTMENT_OTHER): Payer: Medicare HMO | Admitting: Physical Therapy

## 2023-10-18 DIAGNOSIS — M25562 Pain in left knee: Secondary | ICD-10-CM | POA: Diagnosis not present

## 2023-10-19 DIAGNOSIS — G4733 Obstructive sleep apnea (adult) (pediatric): Secondary | ICD-10-CM | POA: Diagnosis not present

## 2023-10-22 ENCOUNTER — Telehealth: Payer: Self-pay | Admitting: Neurology

## 2023-10-22 DIAGNOSIS — M25562 Pain in left knee: Secondary | ICD-10-CM | POA: Diagnosis not present

## 2023-10-22 NOTE — Telephone Encounter (Signed)
[name] [name] with [employer] states they received pt's assistance program application for Ajovy. States application is currently on hold due to it missing the pt's proof of income. Asking if we can reach out to the pt as they will try to as well. Can call [phone]

## 2023-10-23 ENCOUNTER — Telehealth: Payer: Self-pay | Admitting: Pharmacy Technician

## 2023-10-23 ENCOUNTER — Other Ambulatory Visit (HOSPITAL_COMMUNITY): Payer: Self-pay

## 2023-10-23 ENCOUNTER — Telehealth: Payer: Self-pay | Admitting: *Deleted

## 2023-10-23 NOTE — Telephone Encounter (Signed)
PA request has been Submitted. New Encounter created for follow up. For additional info see Pharmacy Prior Auth telephone encounter from 10/23/2023.

## 2023-10-23 NOTE — Telephone Encounter (Signed)
Patient needs an Ajovy PA for formulary exception, required by the patient assistance program he is reapplying for 2025. If there is any question about his tried/failed meds let us know. He has been on Ajovy for about 4 years now.

## 2023-10-23 NOTE — Telephone Encounter (Signed)
Pharmacy Patient Advocate Encounter   Received notification from Pt Calls Messages that prior authorization for AJOVY (fremanezumab-vfrm) injection 225MG /1.5ML auto-injectors is required/requested.   Insurance verification completed.   The patient is insured through [facility] .   Per test claim: PA required; PA submitted to above mentioned insurance via CoverMyMeds Key/confirmation #/EOC [id number] Status is pending

## 2023-10-25 DIAGNOSIS — M25562 Pain in left knee: Secondary | ICD-10-CM | POA: Diagnosis not present

## 2023-10-30 ENCOUNTER — Encounter: Payer: Self-pay | Admitting: Adult Health

## 2023-10-30 ENCOUNTER — Encounter: Payer: Self-pay | Admitting: *Deleted

## 2023-10-30 DIAGNOSIS — M25562 Pain in left knee: Secondary | ICD-10-CM | POA: Diagnosis not present

## 2023-10-30 NOTE — Telephone Encounter (Signed)
I was on the phone for 1 hr 15 minutes. I was able to file an expedited appeal for Ajovy. Reference # [id number]. Fax supporting documents to [phone].

## 2023-10-30 NOTE — Telephone Encounter (Signed)
Currently trying to get a hold of appeals department at [facility]. If unable to get this completed today, will try again later.

## 2023-11-01 ENCOUNTER — Other Ambulatory Visit (HOSPITAL_COMMUNITY): Payer: Self-pay

## 2023-11-01 DIAGNOSIS — M25562 Pain in left knee: Secondary | ICD-10-CM | POA: Diagnosis not present

## 2023-11-01 MED ORDER — AJOVY 225 MG/1.5ML ~~LOC~~ SOAJ: 225.0000 mg | SUBCUTANEOUS | 1 refills | Status: DC

## 2023-11-01 NOTE — Telephone Encounter (Signed)
 Pharmacy Patient Advocate Encounter  Received notification from [facility] that Prior Authorization for Ajovy  has been APPROVED from 10/30/2023 to 11/20/2024. Ran test claim, Copay is $297.00/90 days. This test claim was processed through [facility]- copay amounts may vary at other pharmacies due to pharmacy/plan contracts, or as the patient moves through the different stages of their insurance plan.

## 2023-11-01 NOTE — Telephone Encounter (Signed)
 [employer] has called to report the denial was overturned for Ajovy   effective dates are 10-23-23/10-31-24 if there are questions Loyce Ruffini at [place] states they can be called at [phone]

## 2023-11-01 NOTE — Addendum Note (Signed)
 Addended by: Randi Buster on: 11/01/2023 02:27 PM   Modules accepted: Orders

## 2023-11-06 DIAGNOSIS — Z5189 Encounter for other specified aftercare: Secondary | ICD-10-CM | POA: Diagnosis not present

## 2023-11-06 NOTE — Telephone Encounter (Signed)
[employer] [facility]) Missing information:  proof of income on the application. Will need before processing farther. Phone: [phone]

## 2023-11-06 NOTE — Telephone Encounter (Signed)
The patient is already aware and was working on this. We cannot help with it unfortunately.

## 2023-11-09 DIAGNOSIS — M25562 Pain in left knee: Secondary | ICD-10-CM | POA: Diagnosis not present

## 2023-11-13 DIAGNOSIS — M25562 Pain in left knee: Secondary | ICD-10-CM | POA: Diagnosis not present

## 2023-11-19 DIAGNOSIS — G4733 Obstructive sleep apnea (adult) (pediatric): Secondary | ICD-10-CM | POA: Diagnosis not present

## 2023-11-20 ENCOUNTER — Encounter: Payer: Medicare Other | Admitting: Family Medicine

## 2023-11-20 DIAGNOSIS — M25562 Pain in left knee: Secondary | ICD-10-CM | POA: Diagnosis not present

## 2023-11-22 DIAGNOSIS — M25562 Pain in left knee: Secondary | ICD-10-CM | POA: Diagnosis not present

## 2023-12-02 NOTE — Progress Notes (Unsigned)
 PATIENT: [name] [name] DOB: [birth year]  REASON FOR VISIT: follow up HISTORY FROM: patient PRIMARY NEUROLOGIST: Dr. Lucia Gaskins   Chief Complaint  Patient presents with   RM 19    Alone.  Doing ok,  cpap  ESS 3, FSS 16.  Migraines, ok.  Ajovy, imitrex.     HISTORY OF PRESENT ILLNESS: Today 12/02/23  [name] [name] is a 73 y.o. male who has been followed in this office for obstructive sleep apnea on CPAP and migraine headaches. Returns today for follow-up.  Overall he reports that he has been doing well.  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  He has a follow-up appoint with his orthopedic next week.  OSA on CPAP: had a total knee replacement so sleep quality has not been great.  Reports that CPAP continues to work well for him.  Download is below  Migraines: Remains on Ajovy. Having about 2-3 migraines a month. Remains on Amitriptyline, gabapentin and Cymbalta.  Reports that his orthopedic surgeon restarted tizanidine after surgery.  Patient is amenable to try to reduce his medication.    HISTORY 12/08/22 :   [name] [name] is a 72 y.o. male with a history of OSA on CPAP and Migraine. Returns today for follow-up.    OSA: working well. Makes a difference in how he feels. Will snore and have congestion if he doesn't use it.  CPAP download is below   Migraines: Ajovy is working well. Only have 2-3 migraines a month.  Continues on amitriptyline, gabapentin Cymbalta.  He was able to eliminate tizanidine but recently had a restarted.  He does plan to wean himself back off of the medication.       REVIEW OF SYSTEMS: Out of a complete 14 system review of symptoms, the patient complains only of the following symptoms, and all other reviewed systems are negative.  ALLERGIES: Allergies  Allergen Reactions   Shrimp [Shellfish Allergy] Anaphylaxis and Rash   Codeine Other (See Comments) and Rash    Blacked out   Contrast Media [Iodinated Contrast Media] Rash   Diazepam Rash     Rash over whole body from neck down, and he lost his voice (airway compromise?)   Amoxicillin Other (See Comments)    Unknown   Benadryl [Diphenhydramine]     With dyes    Cyclobenzaprine Other (See Comments)   Depakote [Divalproex Sodium] Swelling    Per pts report (given by Dr. Lesia Hausen).  Has throat swelling and rash.   Diphenhydramine Hcl Other (See Comments)    Other Reaction(s): with dye   Erythromycin Other (See Comments)    unknown   Meperidine Hcl Other (See Comments)   Penicillin G Other (See Comments)   Povidone-Iodine Other (See Comments)   Shrimp Flavor Agent (Non-Screening) Other (See Comments)   Allegra [Fexofenadine] Rash        Betadine [Povidone Iodine] Rash   Celecoxib Rash and Other (See Comments)   Clopidogrel Rash and Other (See Comments)   Meperidine And Related Rash   Methocarbamol Rash and Other (See Comments)   Neosporin [Neomycin-Bacitracin Zn-Polymyx] Rash   Penicillins Rash   Rivaroxaban Rash and Other (See Comments)   Rofecoxib Rash and Other (See Comments)    HOME MEDICATIONS: Outpatient Medications Prior to Visit  Medication Sig Dispense Refill   acetaminophen (TYLENOL) 650 MG CR tablet Take 650-1,300 mg by mouth every 8 (eight) hours as needed for pain.     allopurinol (ZYLOPRIM) 100 MG tablet Take 1 tablet (  100 mg total) by mouth daily. 90 tablet 3   amitriptyline (ELAVIL) 100 MG tablet Take 1 tablet (100 mg total) by mouth at bedtime. 90 tablet 3   DULoxetine (CYMBALTA) 60 MG capsule Take 1 capsule (60 mg total) by mouth daily. 90 capsule 2   EPINEPHrine 0.3 mg/0.3 mL IJ SOAJ injection INJECT 0.3 MLS INTO THE MUSCLE ONCE 2 each 0   fluticasone (FLONASE) 50 MCG/ACT nasal spray Place 1 spray into both nostrils daily. 16 g 1   Fremanezumab-vfrm (AJOVY) 225 MG/1.5ML SOAJ Inject 225 mg into the skin every 30 (thirty) days. 4.5 mL 1   gabapentin (NEURONTIN) 300 MG capsule Take 1 capsule (300 mg total) by mouth at bedtime. 90 capsule 3    HYDROmorphone (DILAUDID) 2 MG tablet Take 1-2 tablets (2-4 mg total) by mouth every 6 (six) hours as needed for severe pain (pain score 7-10). 42 tablet 0   NON FORMULARY Pt uses a cpap nightly     ondansetron (ZOFRAN) 4 MG tablet Take 1 tablet (4 mg total) by mouth every 6 (six) hours as needed for nausea. 20 tablet 0   pravastatin (PRAVACHOL) 40 MG tablet Take 1 tablet (40 mg total) by mouth daily. 90 tablet 3   Psyllium (METAMUCIL PO) Take 2 capsules by mouth at bedtime.     Semaglutide, 1 MG/DOSE, 4 MG/3ML SOPN Inject 1 mg as directed once a week. 9 mL 3   SUMAtriptan (IMITREX) 100 MG tablet TAKE 1 TABLET AS NEEDED FOR MIGRAINE. MAY REPEAT IN 2 HOURS IF HEADACHE PERSISTS OR RECURS. MAX 2 TABLETS IN 24 HOURS 27 tablet 3   tamsulosin (FLOMAX) 0.4 MG CAPS capsule Take 1 capsule (0.4 mg total) by mouth daily. (Patient taking differently: Take 0.4 mg by mouth at bedtime.) 14 capsule 0   tiZANidine (ZANAFLEX) 4 MG tablet Take 1 tablet (4 mg total) by mouth every 6 (six) hours as needed for muscle spasms. 30 tablet 0   No facility-administered medications prior to visit.    PAST MEDICAL HISTORY: Past Medical History:  Diagnosis Date   ALLERGIC RHINITIS 04/22/2007   Allergy    Cancer (HCC)    HX SKIN CANCER   Complication of anesthesia    more concerned about positioning of head & neck because of TMJ   DDD (degenerative disc disease), cervical    Headache(784.0)    migraines   History of kidney stones    History of skin cancer    History of TMJ disorder    HYPERCHOLESTEROLEMIA 04/22/2007   HYPERLIPIDEMIA 07/29/2007   Hyperplastic colon polyp    NEPHROLITHIASIS, HX OF 07/29/2007   Osteoarth NOS-Unspec 04/22/2007   Pneumonia    Shoulder pain, acute    bilateral   Sleep apnea    cpap    PAST SURGICAL HISTORY: Past Surgical History:  Procedure Laterality Date   APPENDECTOMY     BACK SURGERY  10/07/2015   COLONOSCOPY     10 yrs ago- 2 HPP    COLONOSCOPY  2019    CYSTOSCOPY/URETEROSCOPY/HOLMIUM LASER/STENT PLACEMENT Right 07/26/2022   Procedure: RIGHT URETEROSCOPY/HOLMIUM LASER/STENT PLACEMENT;  Surgeon: Despina Arias, MD;  Location: [facility];  Service: Urology;  Laterality: Right;  75 MINUTES NEEDED FOR CASE   JOINT REPLACEMENT     KNEE ARTHROSCOPY     right x2   SHOULDER SURGERY     right   skin cancer biopsy     L forehead   TOTAL KNEE ARTHROPLASTY Right 08/25/2013   Procedure:  RIGHT TOTAL KNEE ARTHROPLASTY;  Surgeon: Loanne Drilling, MD;  Location: [facility];  Service: Orthopedics;  Laterality: Right;   TOTAL KNEE ARTHROPLASTY Left 10/01/2023   Procedure: TOTAL KNEE ARTHROPLASTY;  Surgeon: Ollen Gross, MD;  Location: [facility];  Service: Orthopedics;  Laterality: Left;   TOTAL SHOULDER ARTHROPLASTY Left 12/25/2013   DR SUPPLE    TOTAL SHOULDER ARTHROPLASTY Left 12/25/2013   Procedure: LEFT TOTAL SHOULDER ARTHROPLASTY;  Surgeon: Senaida Lange, MD;  Location: [facility];  Service: Orthopedics;  Laterality: Left;   TOTAL SHOULDER ARTHROPLASTY Right 06/18/2014   DR SUPPLE   TOTAL SHOULDER ARTHROPLASTY Right 06/18/2014   Procedure: RIGHT TOTAL SHOULDER ARTHROPLASTY;  Surgeon: Senaida Lange, MD;  Location: [facility];  Service: Orthopedics;  Laterality: Right;   WISDOM TOOTH EXTRACTION      FAMILY HISTORY: Family History  Problem Relation Age of Onset   COPD Father        family hx   Emphysema Father    Aneurysm Mother    Stroke Mother    Sudden death Mother    Cancer Mother    Obesity Mother    Breast cancer Mother    Cancer Maternal Grandfather        lung   Colon cancer Neg Hx    Colon polyps Neg Hx     SOCIAL HISTORY: Social History   Socioeconomic History   Marital status: Married    Spouse name: Not on file   Number of children: 1   Years of education: 16+   Highest education level: Bachelor's degree (e.g., BA, AB, BS)  Occupational History   Occupation: Retired  Tobacco Use   Smoking status: Never   Smokeless tobacco: Never  Vaping  Use   Vaping status: Never Used  Substance and Sexual Activity   Alcohol use: No    Alcohol/week: 0.0 standard drinks of alcohol   Drug use: No   Sexual activity: Yes    Partners: Female    Birth control/protection: None  Other Topics Concern   Not on file  Social History Narrative   Lives at home w/ his wife   Right-handed   Caffeine: 2 cups of coffee each morning   Social Drivers of Corporate investment banker Strain: Low Risk  (06/30/2023)   Overall Financial Resource Strain (CARDIA)    Difficulty of Paying Living Expenses: Not hard at all  Food Insecurity: No Food Insecurity (10/01/2023)   Hunger Vital Sign    Worried About Running Out of Food in the Last Year: Never true    Ran Out of Food in the Last Year: Never true  Transportation Needs: No Transportation Needs (10/01/2023)   PRAPARE - Administrator, Civil Service (Medical): No    Lack of Transportation (Non-Medical): No  Physical Activity: Sufficiently Active (06/30/2023)   Exercise Vital Sign    Days of Exercise per Week: 5 days    Minutes of Exercise per Session: 60 min  Stress: No Stress Concern Present (06/30/2023)   [employer] of Occupational Health - Occupational Stress Questionnaire    Feeling of Stress : Only a little  Social Connections: Socially Integrated (10/01/2023)   Social Connection and Isolation Panel [NHANES]    Frequency of Communication with Friends and Family: More than three times a week    Frequency of Social Gatherings with Friends and Family: Once a week    Attends Religious Services: 1 to 4 times per year    Active Member  of Clubs or Organizations: No    Attends Banker Meetings: More than 4 times per year    Marital Status: Married  Catering manager Violence: Not At Risk (10/01/2023)   Humiliation, Afraid, Rape, and Kick questionnaire    Fear of Current or Ex-Partner: No    Emotionally Abused: No    Physically Abused: No    Sexually Abused: No      PHYSICAL  EXAM  Vitals:   12/04/23 1100  BP: 117/75  Pulse: 63  Weight: 242 lb 6.4 oz (110 kg)  Height: 5\' 9"  (1.753 m)   Body mass index is 35.8 kg/m.  Generalized: Well developed, in no acute distress   Neurological examination  Mentation: Alert oriented to time, place, history taking. Follows all commands speech and language fluent Cranial nerve II-XII: Pupils were equal round reactive to light. Extraocular movements were full, visual field were full on confrontational test. Facial sensation and strength were normal. Head turning and shoulder shrug  were normal and symmetric. Motor: The motor testing reveals 5 over 5 strength of all 4 extremities. Good symmetric motor tone is noted throughout.  Sensory: Sensory testing is intact to soft touch on all 4 extremities. No evidence of extinction is noted.  Coordination: Cerebellar testing reveals good finger-nose-finger and heel-to-shin bilaterally.  Gait and station: Gait is normal.  DIAGNOSTIC DATA (LABS, IMAGING, TESTING) - I reviewed patient records, labs, notes, testing and imaging myself where available.  Lab Results  Component Value Date   WBC 12.1 (H) 10/02/2023   HGB 14.4 10/02/2023   HCT 42.4 10/02/2023   MCV 97.0 10/02/2023   PLT 210 10/02/2023      Component Value Date/Time   NA 133 (L) 10/02/2023 0327   NA 146 (H) 12/08/2021 1134   K 4.7 10/02/2023 0327   CL 101 10/02/2023 0327   CO2 22 10/02/2023 0327   GLUCOSE 117 (H) 10/02/2023 0327   GLUCOSE 94 08/20/2006 0830   BUN 22 10/02/2023 0327   BUN 28 (H) 12/08/2021 1134   CREATININE 1.03 10/02/2023 0327   CREATININE 1.38 (H) 06/09/2020 0811   CALCIUM 8.7 (L) 10/02/2023 0327   PROT 7.1 07/02/2023 1027   PROT 6.6 12/08/2021 1134   ALBUMIN 4.1 07/02/2023 1027   ALBUMIN 4.4 12/08/2021 1134   AST 30 07/02/2023 1027   ALT 34 07/02/2023 1027   ALKPHOS 42 07/02/2023 1027   BILITOT 0.3 07/02/2023 1027   BILITOT 0.4 12/08/2021 1134   GFRNONAA >60 10/02/2023 0327   GFRAA 78  08/17/2020 1011   Lab Results  Component Value Date   CHOL 175 03/14/2023   HDL 52.70 03/14/2023   LDLCALC 104 (H) 03/14/2023   LDLDIRECT 135.0 07/16/2013   TRIG 90.0 03/14/2023   CHOLHDL 3 03/14/2023   Lab Results  Component Value Date   HGBA1C 6.1 (H) 09/25/2023   No results found for: "VITAMINB12" Lab Results  Component Value Date   TSH 1.87 03/14/2023      ASSESSMENT AND PLAN 73 y.o. year old male  has a past medical history of ALLERGIC RHINITIS (04/22/2007), Allergy, Cancer (HCC), Complication of anesthesia, DDD (degenerative disc disease), cervical, Headache(784.0), History of kidney stones, History of skin cancer, History of TMJ disorder, HYPERCHOLESTEROLEMIA (04/22/2007), HYPERLIPIDEMIA (07/29/2007), Hyperplastic colon polyp, NEPHROLITHIASIS, HX OF (07/29/2007), Osteoarth NOS-Unspec (04/22/2007), Pneumonia, Shoulder pain, acute, and Sleep apnea. here with:   1.  Obstructive sleep apnea on CPAP   -Good compliance -Good treatment of apnea -Encouraged to use CPAP nightly and  greater than 4 hours each night   2.  Migraine headaches   -Discuss with patient about trying to reduce medications  -Continue amitriptyline 100 mg at bedtime -Continue gabapentin 300 mg At bedtime -Decrease Cymbalta from 60 mg to 40 mg daily -Continue Ajovy monthly injection -Continue sumatriptan for abortive therapy -Discuss with orthopedic surgeon to see if tizanidine needs to be continued.  FU in 3 months to continue to reduce medication    Butch Penny, MSN, NP-C 12/02/2023, 1:48 PM [facility] [address], [address] [place], [place] [address] [phone]

## 2023-12-03 NOTE — Progress Notes (Unsigned)
 Setup Date: 11/19/18

## 2023-12-04 ENCOUNTER — Ambulatory Visit: Payer: Medicare HMO | Admitting: Adult Health

## 2023-12-04 VITALS — BP 117/75 | HR 63 | Ht 69.0 in | Wt 242.4 lb

## 2023-12-04 DIAGNOSIS — G43711 Chronic migraine without aura, intractable, with status migrainosus: Secondary | ICD-10-CM

## 2023-12-04 DIAGNOSIS — G4733 Obstructive sleep apnea (adult) (pediatric): Secondary | ICD-10-CM | POA: Diagnosis not present

## 2023-12-04 MED ORDER — DULOXETINE HCL 40 MG PO CPEP
40.0000 mg | ORAL_CAPSULE | Freq: Every day | ORAL | 11 refills | Status: AC
Start: 2023-12-04 — End: ?

## 2023-12-04 NOTE — Patient Instructions (Signed)
 Your Plan:  - Continue CPAP therapy   -Continue amitriptyline 100 mg at bedtime -Continue gabapentin 300 mg At bedtime -Decrease Cymbalta 40 mg daily -Continue Ajovy monthly injection -Continue sumatriptan for abortive therapy -Discuss Tizandine with your surgeon to see if they are ok with you stopping.    Thank you for coming to see [name] at [facility]. I hope we have been able to provide you high quality care today.  You may receive a patient satisfaction survey over the next few weeks. We would appreciate your feedback and comments so that we may continue to improve ourselves and the health of our patients.

## 2023-12-11 ENCOUNTER — Ambulatory Visit: Admitting: Neurology

## 2023-12-11 DIAGNOSIS — G4733 Obstructive sleep apnea (adult) (pediatric): Secondary | ICD-10-CM | POA: Diagnosis not present

## 2023-12-13 ENCOUNTER — Encounter: Payer: Self-pay | Admitting: Adult Health

## 2023-12-13 NOTE — Telephone Encounter (Signed)
Responded to patient via other mychart message

## 2023-12-13 NOTE — Progress Notes (Signed)
 See procedure note.

## 2023-12-13 NOTE — Addendum Note (Signed)
 Addended by: Enedina Finner on: 12/13/2023 04:47 PM   Modules accepted: Orders

## 2023-12-13 NOTE — Procedures (Signed)
 [facility]  HOME SLEEP TEST (Watch PAT) REPORT  STUDY DATE: 12/11/23  DOB: [birth year]  MRN: [id number]  ORDERING CLINICIAN: Huston Foley, MD, PhD   REFERRING CLINICIAN: Butch Penny, NP  CLINICAL INFORMATION/HISTORY: 73 year-old male with an underlying medical history of allergic rhinitis, degenerative disc disease, reflux disease, chronic migraines, history of kidney stone, history of skin cancer, TMJ disorder, hyperlipidemia, status post multiple surgeries including right total knee replacement surgery, right-sided shoulder surgery, left shoulder replacement surgery, right shoulder replacement surgery, lower back surgery, and borderline obesity, who presents for re-evaluation of his OSA. He has been on autoPAP of 7-14 cm with EPR of 3 with good apnea control, excellent compliance and good tolerance. He should qualify for new equipment.   BMI: 35.8 kg/m  FINDINGS:   Sleep Summary:   Total Recording Time (hours, min): 7 hours, 4 min  Total Sleep Time (hours, min):  5 hours, 38 min  Percent REM (%):    14%   Respiratory Indices:   Calculated pAHI (per hour):  7.1/hour  -  utilizing the 4% desaturation criteria for obstructive hypopneas, per Medicare guidelines         REM pAHI:    16.4/hour       NREM pAHI: 5.6/hour  Central pAHI: 0.6/hour  Oxygen Saturation Statistics:    Oxygen Saturation (%) Mean: 92%   Minimum oxygen saturation (%):                 86%   O2 Saturation Range (%): 86 - 97%    O2 Saturation (minutes) <=88%: 2.2 min  Pulse Rate Statistics:   Pulse Mean (bpm):    60/min    Pulse Range (52 - 81/min)   IMPRESSION: OSA (obstructive sleep apnea)    RECOMMENDATION:  This home sleep test demonstrates overall mild obstructive sleep apnea with a total AHI of 7.1/hour and O2 nadir of 86%. Findings are similar to his home sleep test from 2018. Snoring was detected, in the moderate to loud range. Given the patient's medical history  and sleep related complaints, ongoing therapy with a positive airway pressure device is recommended. The patient has been compliant with his current autoPAP machine and should qualify for a new machine. I recommend keeping his treatment settings the same. A full night, in-lab PAP titration study may aid in improving proper treatment settings and with mask fit, if needed, down the road. Alternative treatments may include weight loss (where appropriate) along with avoidance of the supine sleep position (if possible), or an oral appliance in appropriate candidates.   Please note that untreated obstructive sleep apnea may carry additional perioperative morbidity. Patients with significant obstructive sleep apnea should receive perioperative PAP therapy and the surgeons and particularly the anesthesiologist should be informed of the diagnosis and the severity of the sleep disordered breathing. The patient should be cautioned not to drive, work at heights, or operate dangerous or heavy equipment when tired or sleepy. Review and reiteration of good sleep hygiene measures should be pursued with any patient. Other causes of the patient's symptoms, including circadian rhythm disturbances, an underlying mood disorder, medication effect and/or an underlying medical problem cannot be ruled out based on this test. Clinical correlation is recommended.  The patient and his referring provider will be notified of the test results. The patient will be seen in follow up in sleep clinic at [facility], as necessary.  I certify that I have reviewed the raw data recording prior to the issuance of  this report in accordance with the standards of the American Academy of Sleep Medicine (AASM).    INTERPRETING PHYSICIAN:   Huston Foley, MD, PhD Medical Director, [facility]) Diplomat, ABPN (Neurology and Sleep)   [facility] [address], [address] [place], [place]  [address] [phone]

## 2023-12-17 ENCOUNTER — Other Ambulatory Visit: Payer: Self-pay | Admitting: *Deleted

## 2023-12-17 DIAGNOSIS — G4733 Obstructive sleep apnea (adult) (pediatric): Secondary | ICD-10-CM | POA: Diagnosis not present

## 2023-12-17 MED ORDER — AJOVY 225 MG/1.5ML ~~LOC~~ SOAJ: 225.0000 mg | SUBCUTANEOUS | 3 refills | Status: DC

## 2023-12-17 NOTE — Telephone Encounter (Signed)
 New, Maryella Shivers, Otilio Jefferson, RN; Alain Honey; Jeris Penta, [place]; 1 other Received, thank you!

## 2023-12-24 NOTE — Telephone Encounter (Signed)
 Received fax from [employer] stating patient has been approved for [employer] PAP through 09/24/2024. I sent a copy of the letter to medical records for scanning.   His ID number is [id number]

## 2024-01-10 ENCOUNTER — Ambulatory Visit (INDEPENDENT_AMBULATORY_CARE_PROVIDER_SITE_OTHER): Payer: Medicare Other | Admitting: Family Medicine

## 2024-01-10 DIAGNOSIS — Z0000 Encounter for general adult medical examination without abnormal findings: Secondary | ICD-10-CM

## 2024-01-10 DIAGNOSIS — G4733 Obstructive sleep apnea (adult) (pediatric): Secondary | ICD-10-CM | POA: Diagnosis not present

## 2024-01-10 NOTE — Progress Notes (Addendum)
 PATIENT CHECK-IN and HEALTH RISK ASSESSMENT QUESTIONNAIRE:  -completed by phone/video for upcoming Medicare Preventive Visit  Pre-Visit Check-in: 1)Vitals (height, wt, BP, etc) - record in vitals section for visit on day of visit Request home vitals (wt, BP, etc.) and enter into vitals, THEN update Vital Signs SmartPhrase below at the top of the HPI. See below.  2)Review and Update Medications, Allergies PMH, Surgeries, Social history in Epic 3)Hospitalizations in the last year with date/reason? Y, orthopedic surgery  4)Review and Update Care Team (patient's specialists) in Epic 5) Complete PHQ9 in Epic  6) Complete Fall Screening in Epic 7)Review all Health Maintenance Due and order under PCP if not done.  Medicare Wellness Patient Questionnaire:  Answer theses question about your habits: How often do you have a drink containing alcohol?n How many drinks containing alcohol do you have on a typical day when you are drinking?na How often do you have six or more drinks on one occasion?never Have you ever smoked?n Do you use an illicit drugs?n On average, how many days per week do you engage in moderate to strenuous exercise (like a brisk walk)? 5 miles a day, about 60 minutes a day, has started some modified P90x, balance exercises, joined [employer] Typical breakfast: cereal, fruit, orange juice, fruit Typical dinner/lunch: chicken, fish, salad, broccoli, asparagus  Beverages: water Social interactions: friends and social events at least 3x per week  Answer theses question about your everyday activities: Can you perform most household chores?y Are you deaf or have significant trouble hearing?n Do you feel that you have a problem with memory?n Do you feel safe at home?y Last dentist visit? Goes on a regular basis - goes Tuesday 8. Do you have any difficulty performing your everyday activities?n Are you having any difficulty walking, taking medications on your own, and or difficulty  managing daily home needs?n Do you have difficulty walking or climbing stairs?n Do you have difficulty dressing or bathing?n Do you have difficulty doing errands alone such as visiting a doctor's office or shopping?n Do you currently have any difficulty preparing food and eating?n Do you currently have any difficulty using the toilet?n Do you have any difficulty managing your finances?n Do you have any difficulties with housekeeping of managing your housekeeping?n   Do you have Advanced Directives in place (Living Will, Healthcare Power or Attorney)? yes   Last eye Exam and location? Next month   Do you currently use prescribed or non-prescribed narcotic or opioid pain medications? no  Do you have a history or close family history of breast, ovarian, tubal or peritoneal cancer or a family member with BRCA (breast cancer susceptibility 1 and 2) gene mutations? Mother had breast ca    ----------------------------------------------------------------------------------------------------------------------------------------------------------------------------------------------------------------------  Because this visit was a virtual/telehealth visit, some criteria may be missing or patient reported. Any vitals not documented were not able to be obtained and vitals that have been documented are patient reported.    MEDICARE ANNUAL PREVENTIVE CARE VISIT WITH PROVIDER (Welcome to Medicare, initial annual wellness or annual wellness exam)  Virtual Visit via Video Note  I connected with [name] [name] on 01/10/24  by a video enabled telemedicine application and verified that I am speaking with the correct person using two identifiers.  Location patient: home Location provider:work or home office Persons participating in the virtual visit: patient, provider  Concerns and/or follow up today: had total knee replacement in January. Just finished rehab. Doing well.   See HM section in Epic  for other details of  completed HM.    ROS: negative for report of fevers, unintentional weight loss, vision changes, vision loss, hearing loss or change, chest pain, sob, hemoptysis, melena, hematochezia, hematuria, falls, bleeding or bruising, thoughts of suicide or self harm, memory loss  Patient-completed extensive health risk assessment - reviewed and discussed with the patient: See Health Risk Assessment completed with patient prior to the visit either above or in recent phone note. This was reviewed in detailed with the patient today and appropriate recommendations, orders and referrals were placed as needed per Summary below and patient instructions.   Review of Medical History: -PMH, PSH, Family History and current specialty and care providers reviewed and updated and listed below   Patient Care Team: Deeann Saint, MD as PCP - General (Family Medicine) Anson Fret, MD as Consulting Physician (Neurology) Wilder Glade, MD as Consulting Physician (Family Medicine)   Past Medical History:  Diagnosis Date   ALLERGIC RHINITIS 04/22/2007   Allergy    Cancer (HCC)    HX SKIN CANCER   Complication of anesthesia    more concerned about positioning of head & neck because of TMJ   DDD (degenerative disc disease), cervical    Headache(784.0)    migraines   History of kidney stones    History of skin cancer    History of TMJ disorder    HYPERCHOLESTEROLEMIA 04/22/2007   HYPERLIPIDEMIA 07/29/2007   Hyperplastic colon polyp    NEPHROLITHIASIS, HX OF 07/29/2007   Osteoarth NOS-Unspec 04/22/2007   Pneumonia    Shoulder pain, acute    bilateral   Sleep apnea    cpap    Past Surgical History:  Procedure Laterality Date   APPENDECTOMY     BACK SURGERY  10/07/2015   COLONOSCOPY     10 yrs ago- 2 HPP    COLONOSCOPY  2019   CYSTOSCOPY/URETEROSCOPY/HOLMIUM LASER/STENT PLACEMENT Right 07/26/2022   Procedure: RIGHT URETEROSCOPY/HOLMIUM LASER/STENT PLACEMENT;  Surgeon:  Despina Arias, MD;  Location: [facility];  Service: Urology;  Laterality: Right;  75 MINUTES NEEDED FOR CASE   JOINT REPLACEMENT     KNEE ARTHROSCOPY     right x2   SHOULDER SURGERY     right   skin cancer biopsy     L forehead   TOTAL KNEE ARTHROPLASTY Right 08/25/2013   Procedure: RIGHT TOTAL KNEE ARTHROPLASTY;  Surgeon: Loanne Drilling, MD;  Location: [facility];  Service: Orthopedics;  Laterality: Right;   TOTAL KNEE ARTHROPLASTY Left 10/01/2023   Procedure: TOTAL KNEE ARTHROPLASTY;  Surgeon: Ollen Gross, MD;  Location: [facility];  Service: Orthopedics;  Laterality: Left;   TOTAL SHOULDER ARTHROPLASTY Left 12/25/2013   DR SUPPLE    TOTAL SHOULDER ARTHROPLASTY Left 12/25/2013   Procedure: LEFT TOTAL SHOULDER ARTHROPLASTY;  Surgeon: Senaida Lange, MD;  Location: [facility];  Service: Orthopedics;  Laterality: Left;   TOTAL SHOULDER ARTHROPLASTY Right 06/18/2014   DR SUPPLE   TOTAL SHOULDER ARTHROPLASTY Right 06/18/2014   Procedure: RIGHT TOTAL SHOULDER ARTHROPLASTY;  Surgeon: Senaida Lange, MD;  Location: [facility];  Service: Orthopedics;  Laterality: Right;   WISDOM TOOTH EXTRACTION      Social History   Socioeconomic History   Marital status: Married    Spouse name: Not on file   Number of children: 1   Years of education: 16+   Highest education level: Bachelor's degree (e.g., BA, AB, BS)  Occupational History   Occupation: Retired  Tobacco Use   Smoking status: Never  Smokeless tobacco: Never  Vaping Use   Vaping status: Never Used  Substance and Sexual Activity   Alcohol use: No    Alcohol/week: 0.0 standard drinks of alcohol   Drug use: No   Sexual activity: Yes    Partners: Female    Birth control/protection: None  Other Topics Concern   Not on file  Social History Narrative   Lives at home w/ his wife   Right-handed   Caffeine: 2 cups of coffee each morning   Social Drivers of Corporate investment banker Strain: Low Risk  (01/09/2024)   Overall Financial Resource Strain  (CARDIA)    Difficulty of Paying Living Expenses: Not hard at all  Food Insecurity: No Food Insecurity (01/09/2024)   Hunger Vital Sign    Worried About Running Out of Food in the Last Year: Never true    Ran Out of Food in the Last Year: Never true  Transportation Needs: No Transportation Needs (01/09/2024)   PRAPARE - Administrator, Civil Service (Medical): No    Lack of Transportation (Non-Medical): No  Physical Activity: Sufficiently Active (01/09/2024)   Exercise Vital Sign    Days of Exercise per Week: 6 days    Minutes of Exercise per Session: 60 min  Stress: No Stress Concern Present (01/09/2024)   [employer] of Occupational Health - Occupational Stress Questionnaire    Feeling of Stress : Not at all  Social Connections: Socially Integrated (01/09/2024)   Social Connection and Isolation Panel [NHANES]    Frequency of Communication with Friends and Family: Three times a week    Frequency of Social Gatherings with Friends and Family: Three times a week    Attends Religious Services: 1 to 4 times per year    Active Member of Clubs or Organizations: No    Attends Engineer, structural: More than 4 times per year    Marital Status: Married  Catering manager Violence: Not At Risk (10/01/2023)   Humiliation, Afraid, Rape, and Kick questionnaire    Fear of Current or Ex-Partner: No    Emotionally Abused: No    Physically Abused: No    Sexually Abused: No    Family History  Problem Relation Age of Onset   COPD Father        family hx   Emphysema Father    Aneurysm Mother    Stroke Mother    Sudden death Mother    Cancer Mother    Obesity Mother    Breast cancer Mother    Cancer Maternal Grandfather        lung   Colon cancer Neg Hx    Colon polyps Neg Hx     Current Outpatient Medications on File Prior to Visit  Medication Sig Dispense Refill   acetaminophen (TYLENOL) 650 MG CR tablet Take 650-1,300 mg by mouth every 8 (eight) hours as needed for  pain.     allopurinol (ZYLOPRIM) 100 MG tablet Take 1 tablet (100 mg total) by mouth daily. 90 tablet 3   amitriptyline (ELAVIL) 100 MG tablet Take 1 tablet (100 mg total) by mouth at bedtime. 90 tablet 3   DULoxetine 40 MG CPEP Take 1 capsule (40 mg total) by mouth daily. 30 capsule 11   EPINEPHrine 0.3 mg/0.3 mL IJ SOAJ injection INJECT 0.3 MLS INTO THE MUSCLE ONCE 2 each 0   fluticasone (FLONASE) 50 MCG/ACT nasal spray Place 1 spray into both nostrils daily. 16 g 1   Fremanezumab-vfrm (  AJOVY) 225 MG/1.5ML SOAJ Inject 225 mg into the skin every 30 (thirty) days. 4.5 mL 3   gabapentin (NEURONTIN) 300 MG capsule Take 1 capsule (300 mg total) by mouth at bedtime. 90 capsule 3   HYDROmorphone (DILAUDID) 2 MG tablet Take 1-2 tablets (2-4 mg total) by mouth every 6 (six) hours as needed for severe pain (pain score 7-10). 42 tablet 0   NON FORMULARY Pt uses a cpap nightly     ondansetron (ZOFRAN) 4 MG tablet Take 1 tablet (4 mg total) by mouth every 6 (six) hours as needed for nausea. 20 tablet 0   pravastatin (PRAVACHOL) 40 MG tablet Take 1 tablet (40 mg total) by mouth daily. 90 tablet 3   Psyllium (METAMUCIL PO) Take 2 capsules by mouth at bedtime.     Semaglutide, 1 MG/DOSE, 4 MG/3ML SOPN Inject 1 mg as directed once a week. 9 mL 3   SUMAtriptan (IMITREX) 100 MG tablet TAKE 1 TABLET AS NEEDED FOR MIGRAINE. MAY REPEAT IN 2 HOURS IF HEADACHE PERSISTS OR RECURS. MAX 2 TABLETS IN 24 HOURS 27 tablet 3   tamsulosin (FLOMAX) 0.4 MG CAPS capsule Take 1 capsule (0.4 mg total) by mouth daily. (Patient taking differently: Take 0.4 mg by mouth at bedtime.) 14 capsule 0   tiZANidine (ZANAFLEX) 4 MG tablet Take 1 tablet (4 mg total) by mouth every 6 (six) hours as needed for muscle spasms. 30 tablet 0   No current facility-administered medications on file prior to visit.    Allergies  Allergen Reactions   Shrimp [Shellfish Allergy] Anaphylaxis and Rash   Codeine Other (See Comments) and Rash    Blacked out    Contrast Media [Iodinated Contrast Media] Rash   Diazepam Rash    Rash over whole body from neck down, and he lost his voice (airway compromise?)   Amoxicillin Other (See Comments)    Unknown   Benadryl [Diphenhydramine]     With dyes    Cyclobenzaprine Other (See Comments)   Depakote [Divalproex Sodium] Swelling    Per pts report (given by Dr. Katharine Paling).  Has throat swelling and rash.   Diphenhydramine Hcl Other (See Comments)    Other Reaction(s): with dye   Erythromycin Other (See Comments)    unknown   Meperidine Hcl Other (See Comments)   Penicillin G Other (See Comments)   Povidone-Iodine Other (See Comments)   Shrimp Flavor Agent (Non-Screening) Other (See Comments)   Allegra [Fexofenadine] Rash        Betadine [Povidone Iodine] Rash   Celecoxib Rash and Other (See Comments)   Clopidogrel Rash and Other (See Comments)   Meperidine And Related Rash   Methocarbamol Rash and Other (See Comments)   Neosporin [Neomycin-Bacitracin Zn-Polymyx] Rash   Penicillins Rash   Rivaroxaban Rash and Other (See Comments)   Rofecoxib Rash and Other (See Comments)       Physical Exam Vitals requested from patient and listed below if patient had equipment and was able to obtain at home for this virtual visit: There were no vitals filed for this visit. Estimated body mass index is 35.8 kg/m as calculated from the following:   Height as of 12/04/23: 5\' 9"  (1.753 m).   Weight as of 12/04/23: 242 lb 6.4 oz (110 kg).  EKG (optional): deferred due to virtual visit  GENERAL: alert, oriented, no acute distress detected; full vision exam deferred due to pandemic and/or virtual encounter  HEENT: atraumatic, conjunttiva clear, no obvious abnormalities on inspection of external nose  and ears  NECK: normal movements of the head and neck  LUNGS: on inspection no signs of respiratory distress, breathing rate appears normal, no obvious gross SOB, gasping or wheezing  CV: no obvious  cyanosis  MS: moves all visible extremities without noticeable abnormality  PSYCH/NEURO: pleasant and cooperative, no obvious depression or anxiety, speech and thought processing grossly intact, Cognitive function grossly intact  Flowsheet Row Office Visit from 07/02/2023 in [facility] [place]  PHQ-9 Total Score 0           01/10/2024   10:31 AM 07/02/2023    9:18 AM 03/14/2023    8:14 AM 09/29/2022    9:38 AM 09/05/2022    9:12 AM  Depression screen PHQ 2/9  Decreased Interest 0 0 0 0 0  Down, Depressed, Hopeless 0 0 0 0 0  PHQ - 2 Score 0 0 0 0 0  Altered sleeping  0 0 0   Tired, decreased energy  0 0 1   Change in appetite  0 0 0   Feeling bad or failure about yourself   0 0 0   Trouble concentrating  0 0 0   Moving slowly or fidgety/restless  0 0 0   Suicidal thoughts  0 0 0   PHQ-9 Score  0 0 1   Difficult doing work/chores  Not difficult at all  Not difficult at all        03/14/2023    8:13 AM 07/02/2023    9:18 AM 11/19/2023   12:23 PM 01/09/2024    5:01 PM 01/10/2024   10:31 AM  Fall Risk  Falls in the past year? 0 0 1 1 0  Was there an injury with Fall? 0 0 0 0 0  Fall Risk Category Calculator 0 0 1  1  0  Patient at Risk for Falls Due to No Fall Risks No Fall Risks     Fall risk Follow up Falls evaluation completed Falls evaluation completed   Falls evaluation completed     Patient-reported     SUMMARY AND PLAN:  Encounter for Medicare annual wellness exam  Discussed applicable health maintenance/preventive health measures and advised and referred or ordered per patient preferences: -denies diabetes, report healthy weight clinic added that dx, looks like labs show prediabetes, advised in terms of lifestyle to treat similarly and to avoid added sugars, process foods, ultraprocessed grains, sweetened beverages and other lifestyle measures - see below -discussed hep c screening and covid vaccine recs -he reports eye exam is already  scheduled Health Maintenance  Topic Date Due   FOOT EXAM  Never done   OPHTHALMOLOGY EXAM  Never done   Hepatitis C Screening  Never done   COVID-19 Vaccine (7 - Moderna risk 2024-25 season) 12/28/2023   DTaP/Tdap/Td (2 - Td or Tdap) 07/01/2024 (Originally 03/26/2021)   Diabetic kidney evaluation - Urine ACR  03/13/2024   HEMOGLOBIN A1C  03/24/2024   INFLUENZA VACCINE  04/25/2024   Diabetic kidney evaluation - eGFR measurement  10/01/2024   Medicare Annual Wellness (AWV)  01/09/2025   Colonoscopy  11/03/2027   Pneumonia Vaccine 40+ Years old  Completed   Zoster Vaccines- Shingrix  Completed   HPV VACCINES  Aged Out   Meningococcal B Vaccine  Aged [employer] and counseling on the following was provided based on the above review of health and a plan/checklist for the patient, along with additional information discussed, was provided for the  patient in the patient instructions :   -Provided safe balance exercises that can be done at home to improve balance and discussed exercise guidelines for adults with include balance exercises at least 3 days per week.  See pt instructions. He denies falls and recently did balance exercises in rehab. -Advised and counseled on a healthy lifestyle - including the importance of a healthy diet, regular physical activity,  -Reviewed patient's current diet. Advised and counseled on a whole foods based healthy diet with an emphasis on evidence based recommendations for prediabetes/obesity/diabetes.. Also discussed dietary triggers for migraines. A summary of a healthy diet was provided in the Patient Instructions.  -reviewed patient's current physical activity level and discussed exercise guidelines for adults. Congratulated on current progress! Encouraged to add gradually and cautiously 2 days of strength training. Provided community resources and ideas for safe exercise at home to assist in meeting exercise guideline recommendations in a safe and healthy  way - however [employer] would be a great place to do this and he already joined.  -Advise yearly dental visits at minimum and regular eye exams   Follow up: see patient instructions   Patient Instructions  I really enjoyed getting to talk with you today! I am available on Tuesdays and Thursdays for virtual visits if you have any questions or concerns, or if I can be of any further assistance.   CHECKLIST FROM ANNUAL WELLNESS VISIT:  -Follow up (please call to schedule if not scheduled after visit):   -yearly for annual wellness visit with primary care office  Here is a list of your preventive care/health maintenance measures and the plan for each if any are due:  PLAN For any measures below that may be due:   Health Maintenance  Topic Date Due   FOOT EXAM  Never done   OPHTHALMOLOGY EXAM  Never done   Hepatitis C Screening  Never done   COVID-19 Vaccine (7 - Moderna risk 2024-25 season) 12/28/2023   DTaP/Tdap/Td (2 - Td or Tdap) 07/01/2024 (Originally 03/26/2021)   Diabetic kidney evaluation - Urine ACR  03/13/2024   HEMOGLOBIN A1C  03/24/2024   INFLUENZA VACCINE  04/25/2024   Diabetic kidney evaluation - eGFR measurement  10/01/2024   Medicare Annual Wellness (AWV)  01/09/2025   Colonoscopy  11/03/2027   Pneumonia Vaccine 21+ Years old  Completed   Zoster Vaccines- Shingrix  Completed   HPV VACCINES  Aged Out   Meningococcal B Vaccine  Aged Out    -See a dentist at least yearly  -Get your eyes checked and then per your eye specialist's recommendations  -Other issues addressed today:   -I have included below further information regarding a healthy whole foods based diet, physical activity guidelines for adults, stress management and opportunities for social connections. I hope you find this information useful.    -----------------------------------------------------------------------------------------------------------------------------------------------------------------------------------------------------------------------------------------------------------    NUTRITION: -eat real food: lots of colorful vegetables (half the plate) and fruits -5-7 servings of vegetables and fruits per day (fresh or steamed is best), exp. 2 servings of vegetables with lunch and dinner and 2 servings of fruit per day. Berries and greens such as kale and collards are great choices.  -consume on a regular basis:  fresh fruits, fresh veggies, fish, seeds, healthy oils (such as olive oil), whole grains (make sure for bread/pasta/crackers/etc., that the first ingredient on label contains the word "whole"), legumes. -can eat small amounts of dairy and lean meat (no larger than the palm of your hand), but avoid processed  meats such as ham, bacon, lunch meat, etc. -drink water -try to avoid fast food and pre-packaged foods, processed meat, ultra processed foods/beverages (donuts, candy, etc.) -most experts advise limiting sodium to < 2300mg  per day, should limit further is any chronic conditions such as high blood pressure, heart disease, diabetes, etc. The American Heart Association advised that < 1500mg  is is ideal -try to avoid foods/beverages that contain any ingredients with names you do not recognize  -try to avoid foods/beverages  with added sugar or sweeteners/sweets  -try to avoid sweet drinks (including diet drinks): soda, juice, Gatorade, sweet tea, power drinks, diet drinks -try to avoid white rice, white bread, pasta (unless whole grain)  EXERCISE GUIDELINES FOR ADULTS: -if you wish to increase your physical activity, do so gradually and with the approval of your doctor -STOP and seek medical care immediately if you have any chest pain, chest discomfort or trouble breathing when starting or increasing exercise   -move and stretch your body, legs, feet and arms when sitting for long periods -Physical activity guidelines for optimal health in adults: -get at least 150 minutes per week of moderate exercise (can talk, but not sing); this is about 20-30 minutes of sustained activity 5-7 days per week or two 10-15 minute episodes of sustained activity 5-7 days per week -do some muscle building/resistance training/strength training at least 2 days per week  -balance exercises 3+ days per week:   Stand somewhere where you have something sturdy to hold onto if you lose balance    1) lift up on toes, then back down, start with 5x per day and work up to 20x   2) stand and lift one leg straight out to the side so that foot is a few inches of the floor, start with 5x each side and work up to 20x each side   3) stand on one foot, start with 5 seconds each side and work up to 20 seconds on each side  If you need ideas or help with getting more active:  -Silver sneakers https://tools.silversneakers.com  -Walk with a Doc: http://www.duncan-williams.com/  -try to include resistance (weight lifting/strength building) and balance exercises twice per week: or the following link for ideas: http://castillo-powell.com/  BuyDucts.dk  STRESS MANAGEMENT: -can try meditating, or just sitting quietly with deep breathing while intentionally relaxing all parts of your body for 5 minutes daily -if you need further help with stress, anxiety or depression please follow up with your primary doctor or contact the wonderful folks at [facility]: [phone]  SOCIAL CONNECTIONS: -options in [place] if you wish to engage in more social and exercise related activities:  -Silver sneakers https://tools.silversneakers.com  -Walk with a Doc: http://www.duncan-williams.com/  -Check out the [facility] Active Adults 50+ section on the [place]  of [employer] (hiking clubs, book clubs, cards and games, chess, exercise classes, aquatic classes and much more) - see the website for details: https://www.[place].gov/departments/parks-recreation/active-adults50  -YouTube has lots of exercise videos for different ages and abilities as well  -[facility] (a variety of indoor and outdoor inperson activities for adults). 306-705-4194. [address]  -Virtual Online Classes (a variety of topics): see seniorplanet.org or call 240-673-8504  -consider volunteering at a school, hospice center, church, senior center or elsewhere            Terressa Koyanagi, DO

## 2024-01-10 NOTE — Patient Instructions (Addendum)
 I really enjoyed getting to talk with you today! I am available on Tuesdays and Thursdays for virtual visits if you have any questions or concerns, or if I can be of any further assistance.   CHECKLIST FROM ANNUAL WELLNESS VISIT:  -Follow up (please call to schedule if not scheduled after visit):   -yearly for annual wellness visit with primary care office  Here is a list of your preventive care/health maintenance measures and the plan for each if any are due:  PLAN For any measures below that may be due:   Health Maintenance  Topic Date Due   FOOT EXAM  Never done   OPHTHALMOLOGY EXAM  Never done   Hepatitis C Screening  Never done   COVID-19 Vaccine (7 - Moderna risk 2024-25 season) 12/28/2023   DTaP/Tdap/Td (2 - Td or Tdap) 07/01/2024 (Originally 03/26/2021)   Diabetic kidney evaluation - Urine ACR  03/13/2024   HEMOGLOBIN A1C  03/24/2024   INFLUENZA VACCINE  04/25/2024   Diabetic kidney evaluation - eGFR measurement  10/01/2024   Medicare Annual Wellness (AWV)  01/09/2025   Colonoscopy  11/03/2027   Pneumonia Vaccine 100+ Years old  Completed   Zoster Vaccines- Shingrix  Completed   HPV VACCINES  Aged Out   Meningococcal B Vaccine  Aged Out    -See a dentist at least yearly  -Get your eyes checked and then per your eye specialist's recommendations  -Other issues addressed today:   -I have included below further information regarding a healthy whole foods based diet, physical activity guidelines for adults, stress management and opportunities for social connections. I hope you find this information useful.   -----------------------------------------------------------------------------------------------------------------------------------------------------------------------------------------------------------------------------------------------------------    NUTRITION: -eat real food: lots of colorful vegetables (half the plate) and fruits -5-7 servings of vegetables and  fruits per day (fresh or steamed is best), exp. 2 servings of vegetables with lunch and dinner and 2 servings of fruit per day. Berries and greens such as kale and collards are great choices.  -consume on a regular basis:  fresh fruits, fresh veggies, fish, seeds, healthy oils (such as olive oil), whole grains (make sure for bread/pasta/crackers/etc., that the first ingredient on label contains the word "whole"), legumes. -can eat small amounts of dairy and lean meat (no larger than the palm of your hand), but avoid processed meats such as ham, bacon, lunch meat, etc. -drink water -try to avoid fast food and pre-packaged foods, processed meat, ultra processed foods/beverages (donuts, candy, etc.) -most experts advise limiting sodium to < 2300mg  per day, should limit further is any chronic conditions such as high blood pressure, heart disease, diabetes, etc. The American Heart Association advised that < 1500mg  is is ideal -try to avoid foods/beverages that contain any ingredients with names you do not recognize  -try to avoid foods/beverages  with added sugar or sweeteners/sweets  -try to avoid sweet drinks (including diet drinks): soda, juice, Gatorade, sweet tea, power drinks, diet drinks -try to avoid white rice, white bread, pasta (unless whole grain)  EXERCISE GUIDELINES FOR ADULTS: -if you wish to increase your physical activity, do so gradually and with the approval of your doctor -STOP and seek medical care immediately if you have any chest pain, chest discomfort or trouble breathing when starting or increasing exercise  -move and stretch your body, legs, feet and arms when sitting for long periods -Physical activity guidelines for optimal health in adults: -get at least 150 minutes per week of moderate exercise (can talk, but not sing); this  is about 20-30 minutes of sustained activity 5-7 days per week or two 10-15 minute episodes of sustained activity 5-7 days per week -do some muscle  building/resistance training/strength training at least 2 days per week  -balance exercises 3+ days per week:   Stand somewhere where you have something sturdy to hold onto if you lose balance    1) lift up on toes, then back down, start with 5x per day and work up to 20x   2) stand and lift one leg straight out to the side so that foot is a few inches of the floor, start with 5x each side and work up to 20x each side   3) stand on one foot, start with 5 seconds each side and work up to 20 seconds on each side  If you need ideas or help with getting more active:  -Silver sneakers https://tools.silversneakers.com  -Walk with a Doc: http://www.duncan-williams.com/  -try to include resistance (weight lifting/strength building) and balance exercises twice per week: or the following link for ideas: http://castillo-powell.com/  BuyDucts.dk  STRESS MANAGEMENT: -can try meditating, or just sitting quietly with deep breathing while intentionally relaxing all parts of your body for 5 minutes daily -if you need further help with stress, anxiety or depression please follow up with your primary doctor or contact the wonderful folks at [facility]: [phone]  SOCIAL CONNECTIONS: -options in [place] if you wish to engage in more social and exercise related activities:  -Silver sneakers https://tools.silversneakers.com  -Walk with a Doc: http://www.duncan-williams.com/  -Check out the [facility] Active Adults 50+ section on the [employer] (hiking clubs, book clubs, cards and games, chess, exercise classes, aquatic classes and much more) - see the website for details: https://www.[place].gov/departments/parks-recreation/active-adults50  -YouTube has lots of exercise videos for different ages and abilities as well  -[facility] (a variety of indoor and outdoor  inperson activities for adults). [phone]. [address]  -Virtual Online Classes (a variety of topics): see seniorplanet.org or call ([phone]  -consider volunteering at a school, hospice center, church, senior center or elsewhere

## 2024-01-14 DIAGNOSIS — M25562 Pain in left knee: Secondary | ICD-10-CM | POA: Diagnosis not present

## 2024-01-15 DIAGNOSIS — K080 Exfoliation of teeth due to systemic causes: Secondary | ICD-10-CM | POA: Diagnosis not present

## 2024-01-24 ENCOUNTER — Ambulatory Visit (INDEPENDENT_AMBULATORY_CARE_PROVIDER_SITE_OTHER): Admitting: Family Medicine

## 2024-01-24 ENCOUNTER — Encounter: Payer: Self-pay | Admitting: Family Medicine

## 2024-01-24 VITALS — BP 128/76 | HR 108 | Temp 98.6°F | Ht 69.0 in | Wt 238.6 lb

## 2024-01-24 DIAGNOSIS — H6123 Impacted cerumen, bilateral: Secondary | ICD-10-CM

## 2024-01-24 NOTE — Patient Instructions (Signed)
 You can get over the counter Debrox ear drops to help loosen the wax in your ear.

## 2024-01-24 NOTE — Progress Notes (Signed)
 Established Patient Office Visit   Subjective  Patient ID: [name] [name], male    DOB: 08/04/1951  Age: 73 y.o. MRN: [id number]  Chief Complaint  Patient presents with   Ear Fullness    Started 2 weeks ago, patient states his ears feel "closed" when he wakes up in the morning     Patient is 73 year old male seen for acute concern.  Patient endorses ears feeling clogged every morning x 2 weeks.  Patient denies history of seasonal allergies, ear pain, ear pressure.  Patient mentions now being back to walking 5 miles per day after left TKR.   Patient Active Problem List   Diagnosis Date Noted   Squamous cell cancer of skin of forearm, left 10/18/2022   Type 2 diabetes mellitus with other specified complication (HCC) 06/15/2022   Migraine 06/15/2022   Controlled type 2 diabetes mellitus (HCC) 05/11/2022   Hypertension 05/11/2022   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   History of replacement of both shoulder joints 05/06/2020   History of spinal fusion 05/06/2020   History of stress fracture 05/06/2020   Chronic migraine without aura, with intractable migraine, so stated, with status migrainosus 12/02/2018   OSA (obstructive sleep apnea) 12/02/2018   Tinnitus 03/20/2018   Prediabetes 11/21/2017   Other fatigue 06/18/2017   Shortness of breath on exertion 06/18/2017   Other hyperlipidemia 06/18/2017   Vitamin D  deficiency 06/18/2017   Hyperglycemia 06/18/2017   Class 1 obesity with serious comorbidity and body mass index (BMI) of 34.0 to 34.9 in adult 06/18/2017   Right lumbar radiculopathy 08/24/2014   Piriformis syndrome of right side 08/24/2014   S/P shoulder replacement 12/25/2013   Migraine headache 12/22/2013   OA (osteoarthritis) of knee 08/25/2013   HYPERLIPIDEMIA 07/29/2007   NEPHROLITHIASIS, HX OF 07/29/2007   HYPERCHOLESTEROLEMIA 04/22/2007   Allergic rhinitis 04/22/2007   Osteoarthritis 04/22/2007   Past Medical History:  Diagnosis Date    ALLERGIC RHINITIS 04/22/2007   Allergy    Cancer (HCC)    HX SKIN CANCER   Complication of anesthesia    more concerned about positioning of head & neck because of TMJ   DDD (degenerative disc disease), cervical    Headache(784.0)    migraines   History of kidney stones    History of skin cancer    History of TMJ disorder    HYPERCHOLESTEROLEMIA 04/22/2007   HYPERLIPIDEMIA 07/29/2007   Hyperplastic colon polyp    NEPHROLITHIASIS, HX OF 07/29/2007   Osteoarth NOS-Unspec 04/22/2007   Pneumonia    Shoulder pain, acute    bilateral   Sleep apnea    cpap   Past Surgical History:  Procedure Laterality Date   APPENDECTOMY     BACK SURGERY  10/07/2015   COLONOSCOPY     10 yrs ago- 2 HPP    COLONOSCOPY  2019   CYSTOSCOPY/URETEROSCOPY/HOLMIUM LASER/STENT PLACEMENT Right 07/26/2022   Procedure: RIGHT URETEROSCOPY/HOLMIUM LASER/STENT PLACEMENT;  Surgeon: Mallie Seal, MD;  Location: [facility];  Service: Urology;  Laterality: Right;  75 MINUTES NEEDED FOR CASE   JOINT REPLACEMENT     KNEE ARTHROSCOPY     right x2   SHOULDER SURGERY     right   skin cancer biopsy     L forehead   TOTAL KNEE ARTHROPLASTY Right 08/25/2013   Procedure: RIGHT TOTAL KNEE ARTHROPLASTY;  Surgeon: Aurther Blue, MD;  Location: [facility];  Service: Orthopedics;  Laterality: Right;   TOTAL KNEE ARTHROPLASTY Left 10/01/2023  Procedure: TOTAL KNEE ARTHROPLASTY;  Surgeon: Liliane Rei, MD;  Location: [facility];  Service: Orthopedics;  Laterality: Left;   TOTAL SHOULDER ARTHROPLASTY Left 12/25/2013   DR SUPPLE    TOTAL SHOULDER ARTHROPLASTY Left 12/25/2013   Procedure: LEFT TOTAL SHOULDER ARTHROPLASTY;  Surgeon: Glo Larch, MD;  Location: [facility];  Service: Orthopedics;  Laterality: Left;   TOTAL SHOULDER ARTHROPLASTY Right 06/18/2014   DR SUPPLE   TOTAL SHOULDER ARTHROPLASTY Right 06/18/2014   Procedure: RIGHT TOTAL SHOULDER ARTHROPLASTY;  Surgeon: Glo Larch, MD;  Location: [facility];  Service: Orthopedics;   Laterality: Right;   WISDOM TOOTH EXTRACTION     Social History   Tobacco Use   Smoking status: Never   Smokeless tobacco: Never  Vaping Use   Vaping status: Never Used  Substance Use Topics   Alcohol use: No    Alcohol/week: 0.0 standard drinks of alcohol   Drug use: No   Family History  Problem Relation Age of Onset   COPD Father        family hx   Emphysema Father    Aneurysm Mother    Stroke Mother    Sudden death Mother    Cancer Mother    Obesity Mother    Breast cancer Mother    Cancer Maternal Grandfather        lung   Colon cancer Neg Hx    Colon polyps Neg Hx    Allergies  Allergen Reactions   Shrimp [Shellfish Allergy] Anaphylaxis and Rash   Codeine Other (See Comments) and Rash    Blacked out   Contrast Media [Iodinated Contrast Media] Rash   Diazepam  Rash    Rash over whole body from neck down, and he lost his voice (airway compromise?)   Amoxicillin Other (See Comments)    Unknown   Benadryl [Diphenhydramine]     With dyes    Cyclobenzaprine  Other (See Comments)   Depakote  [Divalproex  Sodium] Swelling    Per pts report (given by Dr. Katharine Paling).  Has throat swelling and rash.   Diphenhydramine Hcl Other (See Comments)    Other Reaction(s): with dye   Erythromycin Other (See Comments)    unknown   Meperidine Hcl Other (See Comments)   Penicillin G Other (See Comments)   Povidone-Iodine  Other (See Comments)   Shrimp Flavor Agent (Non-Screening) Other (See Comments)   Allegra [Fexofenadine] Rash        Betadine  [Povidone Iodine ] Rash   Celecoxib Rash and Other (See Comments)   Clopidogrel Rash and Other (See Comments)   Meperidine And Related Rash   Methocarbamol Rash and Other (See Comments)   Neosporin [Neomycin-Bacitracin Zn-Polymyx] Rash   Penicillins Rash   Rivaroxaban  Rash and Other (See Comments)   Rofecoxib Rash and Other (See Comments)      ROS Negative unless stated above    Objective:     BP 128/76 (BP Location: Left  Arm, Patient Position: Sitting, Cuff Size: Normal)   Pulse (!) 108   Temp 98.6 F (37 C) (Oral)   Ht 5\' 9"  (1.753 m)   Wt 238 lb 9.6 oz (108.2 kg)   SpO2 95%   BMI 35.24 kg/m  BP Readings from Last 3 Encounters:  01/24/24 128/76  12/04/23 117/75  10/02/23 (!) 99/59   Wt Readings from Last 3 Encounters:  01/24/24 238 lb 9.6 oz (108.2 kg)  12/04/23 242 lb 6.4 oz (110 kg)  10/01/23 250 lb (113.4 kg)      Physical Exam  Constitutional:      General: He is not in acute distress.    Appearance: Normal appearance.  HENT:     Head: Normocephalic and atraumatic.     Right Ear: Hearing and external ear normal. There is impacted cerumen.     Left Ear: Hearing and external ear normal. There is impacted cerumen.     Ears:     Comments: L TM normal after irrigation.  R canal with cerumen remaining after irrigation.  Curette used by this provider to remove a significant amount of cerumen.  A large piece of cerumen remained partially blocking view of R TM.    Nose: Nose normal.     Mouth/Throat:     Mouth: Mucous membranes are moist.  Cardiovascular:     Rate and Rhythm: Normal rate and regular rhythm.     Heart sounds: Normal heart sounds. No murmur heard.    No gallop.  Pulmonary:     Effort: Pulmonary effort is normal. No respiratory distress.     Breath sounds: Normal breath sounds. No wheezing, rhonchi or rales.  Skin:    General: Skin is warm and dry.  Neurological:     Mental Status: He is alert and oriented to person, place, and time.    No results found for any visits on 01/24/24.    Assessment & Plan:  Bilateral impacted cerumen  Bilateral ears with impacted cerumen.  Consent obtained.  Bilateral ears irrigated.  Curette used to remove a significant cerumen from R canal, however a significant amount remained.    Patient tolerated procedure.  Consider OTC Debrox.  Return if symptoms worsen or fail to improve.   Viola Greulich, MD

## 2024-01-28 ENCOUNTER — Encounter: Payer: Self-pay | Admitting: Adult Health

## 2024-02-04 ENCOUNTER — Telehealth: Payer: Self-pay | Admitting: *Deleted

## 2024-02-04 NOTE — Telephone Encounter (Signed)
 Prescription to Dr. Tresia Fruit for signature.

## 2024-02-06 NOTE — Telephone Encounter (Signed)
 Order for Ajovy  autoinjector replacement signed by Dr Tresia Fruit. Order faxed to [facility] pharmacy [phone]. Received a receipt of confirmation.

## 2024-02-09 DIAGNOSIS — G4733 Obstructive sleep apnea (adult) (pediatric): Secondary | ICD-10-CM | POA: Diagnosis not present

## 2024-02-16 DIAGNOSIS — G4733 Obstructive sleep apnea (adult) (pediatric): Secondary | ICD-10-CM | POA: Diagnosis not present

## 2024-02-19 ENCOUNTER — Telehealth: Payer: Self-pay | Admitting: Family Medicine

## 2024-02-19 NOTE — Telephone Encounter (Unsigned)
 Copied from CRM [id number]. Topic: Clinical - Medication Refill >> Feb 19, 2024  1:43 PM [place] E wrote: Medication: pravastatin  (PRAVACHOL ) 40 MG tablet  Has the patient contacted their pharmacy? No (Agent: If no, request that the patient contact the pharmacy for the refill. If patient does not wish to contact the pharmacy document the reason why and proceed with request.) (Agent: If yes, when and what did the pharmacy advise?)  This is the patient's preferred pharmacy:  [facility] [address], [place] - [address] [place] [address] Phone: [phone] Fax: [phone]   Is this the correct pharmacy for this prescription? Yes If no, delete pharmacy and type the correct one.   Has the prescription been filled recently? No  Is the patient out of the medication? Yes  Has the patient been seen for an appointment in the last year OR does the patient have an upcoming appointment? Yes  Can we respond through MyChart? Yes  Agent: Please be advised that Rx refills may take up to 3 business days. We ask that you follow-up with your pharmacy.

## 2024-02-20 ENCOUNTER — Encounter: Payer: Self-pay | Admitting: Family Medicine

## 2024-02-20 ENCOUNTER — Encounter: Payer: Self-pay | Admitting: Adult Health

## 2024-02-20 NOTE — Telephone Encounter (Signed)
 Called pt to ensure if needing refill on script for 90-day prescription with 3 refills. Pt reporting that he has enough for few more days, informed pt that he may have refill with [facility] since not been 270 days since last refill. Pt reporting that he will check with [facility] but reports that [facility] "has been terrible, been getting most prescriptions at [facility] since more reliable." Pt states he will check on status and put message into MyChart about whether or not he needs refill sent to [facility].

## 2024-02-20 NOTE — Telephone Encounter (Signed)
 I called the patient to clarify his message. Per our records he was never on cymbalta  80 mg. He verified that that is correct. He has only been on the 60 mg tablet. He picked up the 40 mg tablet --its expensive. I told him to finish out his script then we could decrease to 30 mg tablet which should be cheaper. He voiced understanding. Advised that if his headaches worsen or he developed new symptoms he should let us  know.

## 2024-03-06 ENCOUNTER — Encounter: Payer: Self-pay | Admitting: *Deleted

## 2024-03-10 ENCOUNTER — Telehealth (INDEPENDENT_AMBULATORY_CARE_PROVIDER_SITE_OTHER): Admitting: Adult Health

## 2024-03-10 ENCOUNTER — Encounter: Payer: Self-pay | Admitting: Adult Health

## 2024-03-10 DIAGNOSIS — G43909 Migraine, unspecified, not intractable, without status migrainosus: Secondary | ICD-10-CM | POA: Diagnosis not present

## 2024-03-10 DIAGNOSIS — G43009 Migraine without aura, not intractable, without status migrainosus: Secondary | ICD-10-CM

## 2024-03-10 DIAGNOSIS — G4733 Obstructive sleep apnea (adult) (pediatric): Secondary | ICD-10-CM

## 2024-03-10 DIAGNOSIS — G43109 Migraine with aura, not intractable, without status migrainosus: Secondary | ICD-10-CM

## 2024-03-10 MED ORDER — GABAPENTIN 300 MG PO CAPS: 300.0000 mg | ORAL_CAPSULE | Freq: Every day | ORAL | 3 refills | Status: AC

## 2024-03-10 NOTE — Progress Notes (Unsigned)
 PATIENT: [name] [name] DOB: January 03, 1951  REASON FOR VISIT: follow up HISTORY FROM: patient PRIMARY NEUROLOGIST: Dr. Tresia Fruit   No chief complaint on file.   HISTORY OF PRESENT ILLNESS: Today 03/10/24:  [name] [name] is a 73 y.o. male with a history of ***. Returns today for follow-up.   2 headaches a month but can last up to 3 days      12/04/23: [name] [name] is a 73 y.o. male who has been followed in this office for obstructive sleep apnea on CPAP and migraine headaches. Returns today for follow-up.  Overall he reports that he has been doing well.  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  He has a follow-up appoint with his orthopedic next week.  OSA on CPAP: had a total knee replacement so sleep quality has not been great.  Reports that CPAP continues to work well for him.  Download is below  Migraines: Remains on Ajovy . Having about 2-3 migraines a month. Remains on Amitriptyline , gabapentin  and Cymbalta .  Reports that his orthopedic surgeon restarted tizanidine  after surgery.  Patient is amenable to try to reduce his medication.    HISTORY 12/08/22 :   [name] [name] is a 73 y.o. male with a history of OSA on CPAP and Migraine. Returns today for follow-up.    OSA: working well. Makes a difference in how he feels. Will snore and have congestion if he doesn't use it.  CPAP download is below   Migraines: Ajovy  is working well. Only have 2-3 migraines a month.  Continues on amitriptyline , gabapentin  Cymbalta .  He was able to eliminate tizanidine  but recently had a restarted.  He does plan to wean himself back off of the medication.       REVIEW OF SYSTEMS: Out of a complete 14 system review of symptoms, the patient complains only of the following symptoms, and all other reviewed systems are negative.  ALLERGIES: Allergies  Allergen Reactions   Shrimp [Shellfish Allergy] Anaphylaxis and Rash   Codeine Other (See Comments) and Rash    Blacked out   Contrast Media  [Iodinated Contrast Media] Rash   Diazepam  Rash    Rash over whole body from neck down, and he lost his voice (airway compromise?)   Amoxicillin Other (See Comments)    Unknown   Benadryl [Diphenhydramine]     With dyes    Cyclobenzaprine  Other (See Comments)   Depakote  [Divalproex  Sodium] Swelling    Per pts report (given by Dr. Katharine Paling).  Has throat swelling and rash.   Diphenhydramine Hcl Other (See Comments)    Other Reaction(s): with dye   Erythromycin Other (See Comments)    unknown   Meperidine Hcl Other (See Comments)   Penicillin G Other (See Comments)   Povidone-Iodine  Other (See Comments)   Shrimp Flavor Agent (Non-Screening) Other (See Comments)   Allegra [Fexofenadine] Rash        Betadine  [Povidone Iodine ] Rash   Celecoxib Rash and Other (See Comments)   Clopidogrel Rash and Other (See Comments)   Meperidine And Related Rash   Methocarbamol Rash and Other (See Comments)   Neosporin [Neomycin-Bacitracin Zn-Polymyx] Rash   Penicillins Rash   Rivaroxaban  Rash and Other (See Comments)   Rofecoxib Rash and Other (See Comments)    HOME MEDICATIONS: Outpatient Medications Prior to Visit  Medication Sig Dispense Refill   acetaminophen  (TYLENOL ) 650 MG CR tablet Take 650-1,300 mg by mouth every 8 (eight) hours as needed for pain.  allopurinol  (ZYLOPRIM ) 100 MG tablet Take 1 tablet (100 mg total) by mouth daily. 90 tablet 3   amitriptyline  (ELAVIL ) 100 MG tablet Take 1 tablet (100 mg total) by mouth at bedtime. 90 tablet 3   DULoxetine  40 MG CPEP Take 1 capsule (40 mg total) by mouth daily. 30 capsule 11   EPINEPHrine  0.3 mg/0.3 mL IJ SOAJ injection INJECT 0.3 MLS INTO THE MUSCLE ONCE 2 each 0   fluticasone  (FLONASE ) 50 MCG/ACT nasal spray Place 1 spray into both nostrils daily. 16 g 1   Fremanezumab -vfrm (AJOVY ) 225 MG/1.5ML SOAJ Inject 225 mg into the skin every 30 (thirty) days. 4.5 mL 3   gabapentin  (NEURONTIN ) 300 MG capsule Take 1 capsule (300 mg total) by  mouth at bedtime. 90 capsule 3   HYDROmorphone  (DILAUDID ) 2 MG tablet Take 1-2 tablets (2-4 mg total) by mouth every 6 (six) hours as needed for severe pain (pain score 7-10). 42 tablet 0   NON FORMULARY Pt uses a cpap nightly     ondansetron  (ZOFRAN ) 4 MG tablet Take 1 tablet (4 mg total) by mouth every 6 (six) hours as needed for nausea. 20 tablet 0   pravastatin  (PRAVACHOL ) 40 MG tablet Take 1 tablet (40 mg total) by mouth daily. 90 tablet 3   Psyllium (METAMUCIL PO) Take 2 capsules by mouth at bedtime.     Semaglutide , 1 MG/DOSE, 4 MG/3ML SOPN Inject 1 mg as directed once a week. 9 mL 3   SUMAtriptan  (IMITREX ) 100 MG tablet TAKE 1 TABLET AS NEEDED FOR MIGRAINE. MAY REPEAT IN 2 HOURS IF HEADACHE PERSISTS OR RECURS. MAX 2 TABLETS IN 24 HOURS 27 tablet 3   tamsulosin  (FLOMAX ) 0.4 MG CAPS capsule Take 1 capsule (0.4 mg total) by mouth daily. (Patient taking differently: Take 0.4 mg by mouth at bedtime.) 14 capsule 0   tiZANidine  (ZANAFLEX ) 4 MG tablet Take 1 tablet (4 mg total) by mouth every 6 (six) hours as needed for muscle spasms. 30 tablet 0   No facility-administered medications prior to visit.    PAST MEDICAL HISTORY: Past Medical History:  Diagnosis Date   ALLERGIC RHINITIS 04/22/2007   Allergy    Cancer (HCC)    HX SKIN CANCER   Complication of anesthesia    more concerned about positioning of head & neck because of TMJ   DDD (degenerative disc disease), cervical    Headache(784.0)    migraines   History of kidney stones    History of skin cancer    History of TMJ disorder    HYPERCHOLESTEROLEMIA 04/22/2007   HYPERLIPIDEMIA 07/29/2007   Hyperplastic colon polyp    NEPHROLITHIASIS, HX OF 07/29/2007   Osteoarth NOS-Unspec 04/22/2007   Pneumonia    Shoulder pain, acute    bilateral   Sleep apnea    cpap    PAST SURGICAL HISTORY: Past Surgical History:  Procedure Laterality Date   APPENDECTOMY     BACK SURGERY  10/07/2015   COLONOSCOPY     10 yrs ago- 2 HPP     COLONOSCOPY  2019   CYSTOSCOPY/URETEROSCOPY/HOLMIUM LASER/STENT PLACEMENT Right 07/26/2022   Procedure: RIGHT URETEROSCOPY/HOLMIUM LASER/STENT PLACEMENT;  Surgeon: Mallie Seal, MD;  Location: [facility];  Service: Urology;  Laterality: Right;  75 MINUTES NEEDED FOR CASE   JOINT REPLACEMENT     KNEE ARTHROSCOPY     right x2   SHOULDER SURGERY     right   skin cancer biopsy     L forehead   TOTAL  KNEE ARTHROPLASTY Right 08/25/2013   Procedure: RIGHT TOTAL KNEE ARTHROPLASTY;  Surgeon: Aurther Blue, MD;  Location: [facility];  Service: Orthopedics;  Laterality: Right;   TOTAL KNEE ARTHROPLASTY Left 10/01/2023   Procedure: TOTAL KNEE ARTHROPLASTY;  Surgeon: Liliane Rei, MD;  Location: [facility];  Service: Orthopedics;  Laterality: Left;   TOTAL SHOULDER ARTHROPLASTY Left 12/25/2013   DR SUPPLE    TOTAL SHOULDER ARTHROPLASTY Left 12/25/2013   Procedure: LEFT TOTAL SHOULDER ARTHROPLASTY;  Surgeon: Glo Larch, MD;  Location: [facility];  Service: Orthopedics;  Laterality: Left;   TOTAL SHOULDER ARTHROPLASTY Right 06/18/2014   DR SUPPLE   TOTAL SHOULDER ARTHROPLASTY Right 06/18/2014   Procedure: RIGHT TOTAL SHOULDER ARTHROPLASTY;  Surgeon: Glo Larch, MD;  Location: [facility];  Service: Orthopedics;  Laterality: Right;   WISDOM TOOTH EXTRACTION      FAMILY HISTORY: Family History  Problem Relation Age of Onset   COPD Father        family hx   Emphysema Father    Aneurysm Mother    Stroke Mother    Sudden death Mother    Cancer Mother    Obesity Mother    Breast cancer Mother    Cancer Maternal Grandfather        lung   Colon cancer Neg Hx    Colon polyps Neg Hx     SOCIAL HISTORY: Social History   Socioeconomic History   Marital status: Married    Spouse name: Not on file   Number of children: 1   Years of education: 16+   Highest education level: Bachelor's degree (e.g., BA, AB, BS)  Occupational History   Occupation: Retired  Tobacco Use   Smoking status: Never   Smokeless  tobacco: Never  Vaping Use   Vaping status: Never Used  Substance and Sexual Activity   Alcohol use: No    Alcohol/week: 0.0 standard drinks of alcohol   Drug use: No   Sexual activity: Yes    Partners: Female    Birth control/protection: None  Other Topics Concern   Not on file  Social History Narrative   Lives at home w/ his wife   Right-handed   Caffeine: 2 cups of coffee each morning   Social Drivers of Health   Financial Resource Strain: Low Risk  (01/09/2024)   Overall Financial Resource Strain (CARDIA)    Difficulty of Paying Living Expenses: Not hard at all  Food Insecurity: No Food Insecurity (01/09/2024)   Hunger Vital Sign    Worried About Running Out of Food in the Last Year: Never true    Ran Out of Food in the Last Year: Never true  Transportation Needs: No Transportation Needs (01/09/2024)   PRAPARE - Administrator, Civil Service (Medical): No    Lack of Transportation (Non-Medical): No  Physical Activity: Sufficiently Active (01/09/2024)   Exercise Vital Sign    Days of Exercise per Week: 6 days    Minutes of Exercise per Session: 60 min  Stress: No Stress Concern Present (01/09/2024)   [employer] of Occupational Health - Occupational Stress Questionnaire    Feeling of Stress : Not at all  Social Connections: Socially Integrated (01/09/2024)   Social Connection and Isolation Panel    Frequency of Communication with Friends and Family: Three times a week    Frequency of Social Gatherings with Friends and Family: Three times a week    Attends Religious Services: 1 to 4 times per year  Active Member of Clubs or Organizations: No    Attends Banker Meetings: More than 4 times per year    Marital Status: Married  Catering manager Violence: Not At Risk (10/01/2023)   Humiliation, Afraid, Rape, and Kick questionnaire    Fear of Current or Ex-Partner: No    Emotionally Abused: No    Physically Abused: No    Sexually Abused: No       PHYSICAL EXAM  There were no vitals filed for this visit.  There is no height or weight on file to calculate BMI.  Generalized: Well developed, in no acute distress   Neurological examination  Mentation: Alert oriented to time, place, history taking. Follows all commands speech and language fluent Cranial nerve II-XII: Pupils were equal round reactive to light. Extraocular movements were full, visual field were full on confrontational test. Facial sensation and strength were normal. Head turning and shoulder shrug  were normal and symmetric. Motor: The motor testing reveals 5 over 5 strength of all 4 extremities. Good symmetric motor tone is noted throughout.  Sensory: Sensory testing is intact to soft touch on all 4 extremities. No evidence of extinction is noted.  Coordination: Cerebellar testing reveals good finger-nose-finger and heel-to-shin bilaterally.  Gait and station: Gait is normal.  DIAGNOSTIC DATA (LABS, IMAGING, TESTING) - I reviewed patient records, labs, notes, testing and imaging myself where available.  Lab Results  Component Value Date   WBC 12.1 (H) 10/02/2023   HGB 14.4 10/02/2023   HCT 42.4 10/02/2023   MCV 97.0 10/02/2023   PLT 210 10/02/2023      Component Value Date/Time   NA 133 (L) 10/02/2023 0327   NA 146 (H) 12/08/2021 1134   K 4.7 10/02/2023 0327   CL 101 10/02/2023 0327   CO2 22 10/02/2023 0327   GLUCOSE 117 (H) 10/02/2023 0327   GLUCOSE 94 08/20/2006 0830   BUN 22 10/02/2023 0327   BUN 28 (H) 12/08/2021 1134   CREATININE 1.03 10/02/2023 0327   CREATININE 1.38 (H) 06/09/2020 0811   CALCIUM 8.7 (L) 10/02/2023 0327   PROT 7.1 07/02/2023 1027   PROT 6.6 12/08/2021 1134   ALBUMIN 4.1 07/02/2023 1027   ALBUMIN 4.4 12/08/2021 1134   AST 30 07/02/2023 1027   ALT 34 07/02/2023 1027   ALKPHOS 42 07/02/2023 1027   BILITOT 0.3 07/02/2023 1027   BILITOT 0.4 12/08/2021 1134   GFRNONAA >60 10/02/2023 0327   GFRAA 78 08/17/2020 1011   Lab  Results  Component Value Date   CHOL 175 03/14/2023   HDL 52.70 03/14/2023   LDLCALC 104 (H) 03/14/2023   LDLDIRECT 135.0 07/16/2013   TRIG 90.0 03/14/2023   CHOLHDL 3 03/14/2023   Lab Results  Component Value Date   HGBA1C 6.1 (H) 09/25/2023   No results found for: VITAMINB12 Lab Results  Component Value Date   TSH 1.87 03/14/2023      ASSESSMENT AND PLAN 73 y.o. year old male  has a past medical history of ALLERGIC RHINITIS (04/22/2007), Allergy, Cancer (HCC), Complication of anesthesia, DDD (degenerative disc disease), cervical, Headache(784.0), History of kidney stones, History of skin cancer, History of TMJ disorder, HYPERCHOLESTEROLEMIA (04/22/2007), HYPERLIPIDEMIA (07/29/2007), Hyperplastic colon polyp, NEPHROLITHIASIS, HX OF (07/29/2007), Osteoarth NOS-Unspec (04/22/2007), Pneumonia, Shoulder pain, acute, and Sleep apnea. here with:   1.  Obstructive sleep apnea on CPAP   -Good compliance -Good treatment of apnea -Encouraged to use CPAP nightly and greater than 4 hours each night   2.  Migraine  headaches   -Discuss with patient about trying to reduce medications  -Continue amitriptyline  100 mg at bedtime -Continue gabapentin  300 mg At bedtime -Decrease Cymbalta  from 30 mg  -Continue Ajovy  monthly injection -Continue sumatriptan  for abortive therapy -Discuss with orthopedic surgeon to see if tizanidine  needs to be continued.  FU in 3 months to continue to reduce medication    Clem Currier, MSN, NP-C 03/10/2024, 10:56 AM [facility] [address] [place], [place] [address] [phone]

## 2024-03-11 ENCOUNTER — Other Ambulatory Visit (HOSPITAL_COMMUNITY): Payer: Self-pay

## 2024-03-11 ENCOUNTER — Telehealth: Payer: Self-pay

## 2024-03-11 DIAGNOSIS — G4733 Obstructive sleep apnea (adult) (pediatric): Secondary | ICD-10-CM | POA: Diagnosis not present

## 2024-03-11 MED ORDER — DULOXETINE HCL 30 MG PO CPEP: 30.0000 mg | ORAL_CAPSULE | Freq: Every day | ORAL | 11 refills | Status: AC

## 2024-03-11 NOTE — Telephone Encounter (Signed)
 Received staff message requesting to see if we can try to find out which Cymbalta  the PT might can get cheaper. Unable to run test claim due to Refill Too Soon Rejection. We can try closer to 04/03/2024 when he is due to refill, Medication in question is the 30mg  capsules and the 30mg  DR       I added the research I found on [employer] 2025 formulary.

## 2024-03-11 NOTE — Addendum Note (Signed)
 Addended by: Viktoria Gray on: 03/11/2024 08:32 AM   Modules accepted: Orders

## 2024-03-12 ENCOUNTER — Encounter: Payer: Self-pay | Admitting: Family Medicine

## 2024-03-17 ENCOUNTER — Other Ambulatory Visit: Payer: Self-pay | Admitting: Family Medicine

## 2024-03-17 NOTE — Telephone Encounter (Unsigned)
 Copied from CRM [id number]. Topic: Clinical - Medication Refill >> Mar 17, 2024  5:18 PM [place] J wrote: Medication: pravastatin  (PRAVACHOL ) 40 MG tablet  Has the patient contacted their pharmacy? No (Agent: If no, request that the patient contact the pharmacy for the refill. If patient does not wish to contact the pharmacy document the reason why and proceed with request.) (Agent: If yes, when and what did the pharmacy advise?) Pharmacist from [facility] is calling to do a medication refill. This is the patient's preferred pharmacy:  [facility] [address] [place] - [address] [place] [address] Phone: [phone] Fax: [phone]  Is this the correct pharmacy for this prescription? Yes If no, delete pharmacy and type the correct one.   Has the prescription been filled recently? No  Is the patient out of the medication? Yes  Has the patient been seen for an appointment in the last year OR does the patient have an upcoming appointment? Yes  Can we respond through MyChart? Yes  Agent: Please be advised that Rx refills may take up to 3 business days. We ask that you follow-up with your pharmacy.

## 2024-03-18 DIAGNOSIS — G4733 Obstructive sleep apnea (adult) (pediatric): Secondary | ICD-10-CM | POA: Diagnosis not present

## 2024-03-19 ENCOUNTER — Telehealth: Payer: Self-pay

## 2024-03-19 NOTE — Telephone Encounter (Signed)
 Copied from CRM [id number]. Topic: General - Other >> Mar 19, 2024 12:57 PM Rosina BIRCH wrote: Reason for CRM: amanda from [employer] called stating they sent a fax out regarding the patient medication rosuvastatin and she would like to know if the office received the fax CB [phone]

## 2024-03-20 ENCOUNTER — Encounter: Payer: Self-pay | Admitting: Family Medicine

## 2024-03-20 NOTE — Telephone Encounter (Signed)
 The patient is not taking Rosuvastatin at this time

## 2024-03-26 ENCOUNTER — Telehealth: Payer: Self-pay | Admitting: Family Medicine

## 2024-03-26 NOTE — Telephone Encounter (Signed)
 Pt wife called in wondering why husband received a dismissal letter. Relayed to the pt that his mychart message that he sent on 6/18 was what initiated his dismissal. Read msg back to pt and wife agreed that pt tone could've been better.   Wife wanted me to let Dr know that Deatrice apologize for his tone and that they could've been given a warning before receiving a dismissal letter; since he has been a pt of [facility] for 25 yrs. Feel like his actions wasn't suitable enough to be dismissed.   Please advise.

## 2024-04-04 ENCOUNTER — Ambulatory Visit: Payer: Self-pay

## 2024-04-04 ENCOUNTER — Other Ambulatory Visit: Payer: Self-pay

## 2024-04-04 DIAGNOSIS — Z87442 Personal history of urinary calculi: Secondary | ICD-10-CM | POA: Diagnosis not present

## 2024-04-04 DIAGNOSIS — N401 Enlarged prostate with lower urinary tract symptoms: Secondary | ICD-10-CM | POA: Diagnosis not present

## 2024-04-04 MED ORDER — PRAVASTATIN SODIUM 40 MG PO TABS: 40.0000 mg | ORAL_TABLET | Freq: Every day | ORAL | 3 refills | Status: DC

## 2024-04-04 MED ORDER — PRAVASTATIN SODIUM 40 MG PO TABS: 40.0000 mg | ORAL_TABLET | Freq: Every day | ORAL | 3 refills | Status: AC

## 2024-04-04 NOTE — Telephone Encounter (Signed)
 Pt wife called asking if Dr will accept Deatrice apology; let pt know that I have sent the msgs to the provider as well as talked to provider and that she will be responding back to his msgs soon. Pt wife then asked if she was dismissed and I let her know that she wasn't.   Please advise.  Copied from CRM [id number]. Topic: General - Call Back - No Documentation >> Apr 04, 2024  9:33 AM Avram MATSU wrote: Reason for CRM: Wife is calling because her husband needs medication. I already informed her someone will respond to the pt through mychart. However she is asking to speak with candace over the phone. [phone]

## 2024-04-04 NOTE — Telephone Encounter (Addendum)
  Patient calling for a prescription refill- Pravastatin .  Patients wife denies patient is having current urinary concerns/symptoms.  Patients wife notes that the dismissal letter indicates they can still receive care for the next 30 days. Wife is asking for a prescription refill.    Medication pended and routed appropriately.       ------------------------------------------------------------ Reason for Triage: urine is coming out in spurts. Question if his kidney stones returning.    Reason for Disposition  [1] Caller requesting NON-URGENT health information AND [2] PCP's office is the best resource  Answer Assessment - Initial Assessment Questions 1. REASON FOR CALL:  Protocols used: Information Only Call - No Triage-A-AH

## 2024-04-09 ENCOUNTER — Telehealth: Payer: Self-pay

## 2024-04-09 NOTE — Telephone Encounter (Signed)
 Called below number was sent to an generated voice message for patient relations.

## 2024-04-09 NOTE — Telephone Encounter (Signed)
 Copied from CRM [id number]. Topic: General - Other >> Apr 09, 2024  1:36 PM Larissa S wrote: Reason for CRM: Att: [place], Print production planner- Lela with [place] Patient relations requesting a calllback.   Callback # [phone]

## 2024-04-10 DIAGNOSIS — G4733 Obstructive sleep apnea (adult) (pediatric): Secondary | ICD-10-CM | POA: Diagnosis not present

## 2024-04-14 ENCOUNTER — Encounter: Payer: Self-pay | Admitting: Family Medicine

## 2024-04-14 ENCOUNTER — Encounter: Payer: Self-pay | Admitting: Adult Health

## 2024-04-19 DIAGNOSIS — G4733 Obstructive sleep apnea (adult) (pediatric): Secondary | ICD-10-CM | POA: Diagnosis not present

## 2024-04-23 DIAGNOSIS — E785 Hyperlipidemia, unspecified: Secondary | ICD-10-CM | POA: Diagnosis not present

## 2024-04-23 DIAGNOSIS — G4733 Obstructive sleep apnea (adult) (pediatric): Secondary | ICD-10-CM | POA: Diagnosis not present

## 2024-04-23 DIAGNOSIS — Z8669 Personal history of other diseases of the nervous system and sense organs: Secondary | ICD-10-CM | POA: Diagnosis not present

## 2024-04-23 DIAGNOSIS — E790 Hyperuricemia without signs of inflammatory arthritis and tophaceous disease: Secondary | ICD-10-CM | POA: Diagnosis not present

## 2024-05-13 ENCOUNTER — Encounter: Payer: Self-pay | Admitting: Adult Health

## 2024-05-20 DIAGNOSIS — G4733 Obstructive sleep apnea (adult) (pediatric): Secondary | ICD-10-CM | POA: Diagnosis not present

## 2024-06-06 DIAGNOSIS — L570 Actinic keratosis: Secondary | ICD-10-CM | POA: Diagnosis not present

## 2024-06-06 DIAGNOSIS — C44619 Basal cell carcinoma of skin of left upper limb, including shoulder: Secondary | ICD-10-CM | POA: Diagnosis not present

## 2024-06-06 DIAGNOSIS — L578 Other skin changes due to chronic exposure to nonionizing radiation: Secondary | ICD-10-CM | POA: Diagnosis not present

## 2024-06-06 DIAGNOSIS — D225 Melanocytic nevi of trunk: Secondary | ICD-10-CM | POA: Diagnosis not present

## 2024-06-06 DIAGNOSIS — L821 Other seborrheic keratosis: Secondary | ICD-10-CM | POA: Diagnosis not present

## 2024-06-06 DIAGNOSIS — D485 Neoplasm of uncertain behavior of skin: Secondary | ICD-10-CM | POA: Diagnosis not present

## 2024-06-06 DIAGNOSIS — D2372 Other benign neoplasm of skin of left lower limb, including hip: Secondary | ICD-10-CM | POA: Diagnosis not present

## 2024-06-11 ENCOUNTER — Encounter: Payer: Self-pay | Admitting: *Deleted

## 2024-06-12 ENCOUNTER — Telehealth: Admitting: Adult Health

## 2024-06-12 DIAGNOSIS — G4733 Obstructive sleep apnea (adult) (pediatric): Secondary | ICD-10-CM

## 2024-06-12 DIAGNOSIS — G43009 Migraine without aura, not intractable, without status migrainosus: Secondary | ICD-10-CM

## 2024-06-12 DIAGNOSIS — G43909 Migraine, unspecified, not intractable, without status migrainosus: Secondary | ICD-10-CM | POA: Diagnosis not present

## 2024-06-12 NOTE — Progress Notes (Signed)
 PATIENT: [name] [name] DOB: 1950-11-04  REASON FOR VISIT: follow up HISTORY FROM: patient  Virtual Visit via Video Note  I connected with [name] [name] on 06/12/24 at  3:00 PM EDT by a video enabled telemedicine application located remotely at home office and verified that I am speaking with the correct person using two identifiers who was located at their own home in [place].   I discussed the limitations of evaluation and management by telemedicine and the availability of in person appointments. The patient expressed understanding and agreed to proceed.     HISTORY OF PRESENT ILLNESS: Today 06/12/24:  [name] [name] is a 73 y.o. male with a history of obstructive sleep apnea on CPAP and migraine headaches. Returns today for follow-up.  He reports that CPAP is working well.  He states that he has remained on Cymbalta  30 mg daily.  He states that his wife has noted a change in his mood and behavior.  He would like to stay at the current dose for now.  He has remained on amitriptyline  and gabapentin  as well.  He remains on Ajovy  injections monthly.  He reports that he is only had 1 migraine in the last 6 weeks so he does feel that the combination of medication is working well for him.     03/10/24: [name] [name] is a 73 y.o. male with a history of Migraine headaches and OSA on CPAP. Returns today for follow-up. We have been trying to slowly reduce his medications. We reduced cymbalta  to 40 mg daily- although he states initially the medication was more expensive.  He does state that his migraine headaches have remained relatively stable.  Reports approximately 2 headaches a month that can last sometimes up to 3 days.  He remains on amitriptyline , gabapentin  and Ajovy .  Uses sumatriptan  for abortive therapy.  His CPAP report is below      12/04/23: [name] [name] is a 73 y.o. male who has been followed in this office for obstructive sleep apnea on CPAP and migraine headaches.  Returns today for follow-up.  Overall he reports that he has been doing well.  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  He has a follow-up appoint with his orthopedic next week.  OSA on CPAP: had a total knee replacement so sleep quality has not been great.  Reports that CPAP continues to work well for him.  Download is below  Migraines: Remains on Ajovy . Having about 2-3 migraines a month. Remains on Amitriptyline , gabapentin  and Cymbalta .  Reports that his orthopedic surgeon restarted tizanidine  after surgery.  Patient is amenable to try to reduce his medication.    HISTORY 12/08/22 :   [name] [name] is a 73 y.o. male with a history of OSA on CPAP and Migraine. Returns today for follow-up.    OSA: working well. Makes a difference in how he feels. Will snore and have congestion if he doesn't use it.  CPAP download is below   Migraines: Ajovy  is working well. Only have 2-3 migraines a month.  Continues on amitriptyline , gabapentin  Cymbalta .  He was able to eliminate tizanidine  but recently had a restarted.  He does plan to wean himself back off of the medication.       REVIEW OF SYSTEMS: Out of a complete 14 system review of symptoms, the patient complains only of the following symptoms, and all other reviewed systems are negative.  ALLERGIES: Allergies  Allergen Reactions   Shrimp [  Shellfish Allergy] Anaphylaxis and Rash   Codeine Other (See Comments) and Rash    Blacked out   Contrast Media [Iodinated Contrast Media] Rash   Diazepam  Rash    Rash over whole body from neck down, and he lost his voice (airway compromise?)   Amoxicillin Other (See Comments)    Unknown   Benadryl [Diphenhydramine]     With dyes    Cyclobenzaprine  Other (See Comments)   Depakote  [Divalproex  Sodium] Swelling    Per pts report (given by Dr. Jannine).  Has throat swelling and rash.   Diphenhydramine Hcl Other (See Comments)    Other Reaction(s): with dye   Erythromycin Other (See Comments)     unknown   Meperidine Hcl Other (See Comments)   Penicillin G Other (See Comments)   Povidone-Iodine  Other (See Comments)   Shrimp Flavor Agent (Non-Screening) Other (See Comments)   Allegra [Fexofenadine] Rash        Betadine  [Povidone Iodine ] Rash   Celecoxib Rash and Other (See Comments)   Clopidogrel Rash and Other (See Comments)   Meperidine And Related Rash   Methocarbamol Rash and Other (See Comments)   Neosporin [Neomycin-Bacitracin Zn-Polymyx] Rash   Penicillins Rash   Rivaroxaban  Rash and Other (See Comments)   Rofecoxib Rash and Other (See Comments)    HOME MEDICATIONS: Outpatient Medications Prior to Visit  Medication Sig Dispense Refill   acetaminophen  (TYLENOL ) 650 MG CR tablet Take 650-1,300 mg by mouth every 8 (eight) hours as needed for pain.     allopurinol  (ZYLOPRIM ) 100 MG tablet Take 1 tablet (100 mg total) by mouth daily. 90 tablet 3   amitriptyline  (ELAVIL ) 100 MG tablet Take 1 tablet (100 mg total) by mouth at bedtime. 90 tablet 3   DULoxetine  (CYMBALTA ) 30 MG capsule Take 1 capsule (30 mg total) by mouth daily. 30 capsule 11   EPINEPHrine  0.3 mg/0.3 mL IJ SOAJ injection INJECT 0.3 MLS INTO THE MUSCLE ONCE 2 each 0   fluticasone  (FLONASE ) 50 MCG/ACT nasal spray Place 1 spray into both nostrils daily. 16 g 1   Fremanezumab -vfrm (AJOVY ) 225 MG/1.5ML SOAJ Inject 225 mg into the skin every 30 (thirty) days. 4.5 mL 3   gabapentin  (NEURONTIN ) 300 MG capsule Take 1 capsule (300 mg total) by mouth at bedtime. 90 capsule 3   HYDROmorphone  (DILAUDID ) 2 MG tablet Take 1-2 tablets (2-4 mg total) by mouth every 6 (six) hours as needed for severe pain (pain score 7-10). 42 tablet 0   NON FORMULARY Pt uses a cpap nightly     ondansetron  (ZOFRAN ) 4 MG tablet Take 1 tablet (4 mg total) by mouth every 6 (six) hours as needed for nausea. 20 tablet 0   pravastatin  (PRAVACHOL ) 40 MG tablet Take 1 tablet (40 mg total) by mouth daily. 90 tablet 3   Psyllium (METAMUCIL PO) Take 2  capsules by mouth at bedtime.     Semaglutide , 1 MG/DOSE, 4 MG/3ML SOPN Inject 1 mg as directed once a week. 9 mL 3   SUMAtriptan  (IMITREX ) 100 MG tablet TAKE 1 TABLET AS NEEDED FOR MIGRAINE. MAY REPEAT IN 2 HOURS IF HEADACHE PERSISTS OR RECURS. MAX 2 TABLETS IN 24 HOURS 27 tablet 3   tamsulosin  (FLOMAX ) 0.4 MG CAPS capsule Take 1 capsule (0.4 mg total) by mouth daily. (Patient taking differently: Take 0.4 mg by mouth at bedtime.) 14 capsule 0   tiZANidine  (ZANAFLEX ) 4 MG tablet Take 1 tablet (4 mg total) by mouth every 6 (six) hours as needed for  muscle spasms. 30 tablet 0   No facility-administered medications prior to visit.    PAST MEDICAL HISTORY: Past Medical History:  Diagnosis Date   ALLERGIC RHINITIS 04/22/2007   Allergy    Cancer (HCC)    HX SKIN CANCER   Complication of anesthesia    more concerned about positioning of head & neck because of TMJ   DDD (degenerative disc disease), cervical    Headache(784.0)    migraines   History of kidney stones    History of skin cancer    History of TMJ disorder    HYPERCHOLESTEROLEMIA 04/22/2007   HYPERLIPIDEMIA 07/29/2007   Hyperplastic colon polyp    NEPHROLITHIASIS, HX OF 07/29/2007   Osteoarth NOS-Unspec 04/22/2007   Pneumonia    Shoulder pain, acute    bilateral   Sleep apnea    cpap    PAST SURGICAL HISTORY: Past Surgical History:  Procedure Laterality Date   APPENDECTOMY     BACK SURGERY  10/07/2015   COLONOSCOPY     10 yrs ago- 2 HPP    COLONOSCOPY  2019   CYSTOSCOPY/URETEROSCOPY/HOLMIUM LASER/STENT PLACEMENT Right 07/26/2022   Procedure: RIGHT URETEROSCOPY/HOLMIUM LASER/STENT PLACEMENT;  Surgeon: Lovie Arlyss CROME, MD;  Location: [facility];  Service: Urology;  Laterality: Right;  75 MINUTES NEEDED FOR CASE   JOINT REPLACEMENT     KNEE ARTHROSCOPY     right x2   SHOULDER SURGERY     right   skin cancer biopsy     L forehead   TOTAL KNEE ARTHROPLASTY Right 08/25/2013   Procedure: RIGHT TOTAL KNEE ARTHROPLASTY;   Surgeon: Dempsey LULLA Moan, MD;  Location: [facility];  Service: Orthopedics;  Laterality: Right;   TOTAL KNEE ARTHROPLASTY Left 10/01/2023   Procedure: TOTAL KNEE ARTHROPLASTY;  Surgeon: Moan Dempsey, MD;  Location: [facility];  Service: Orthopedics;  Laterality: Left;   TOTAL SHOULDER ARTHROPLASTY Left 12/25/2013   DR SUPPLE    TOTAL SHOULDER ARTHROPLASTY Left 12/25/2013   Procedure: LEFT TOTAL SHOULDER ARTHROPLASTY;  Surgeon: Franky CHRISTELLA Pointer, MD;  Location: [facility];  Service: Orthopedics;  Laterality: Left;   TOTAL SHOULDER ARTHROPLASTY Right 06/18/2014   DR SUPPLE   TOTAL SHOULDER ARTHROPLASTY Right 06/18/2014   Procedure: RIGHT TOTAL SHOULDER ARTHROPLASTY;  Surgeon: Franky CHRISTELLA Pointer, MD;  Location: [facility];  Service: Orthopedics;  Laterality: Right;   WISDOM TOOTH EXTRACTION      FAMILY HISTORY: Family History  Problem Relation Age of Onset   COPD Father        family hx   Emphysema Father    Aneurysm Mother    Stroke Mother    Sudden death Mother    Cancer Mother    Obesity Mother    Breast cancer Mother    Cancer Maternal Grandfather        lung   Colon cancer Neg Hx    Colon polyps Neg Hx     SOCIAL HISTORY: Social History   Socioeconomic History   Marital status: Married    Spouse name: Not on file   Number of children: 1   Years of education: 16+   Highest education level: Bachelor's degree (e.g., BA, AB, BS)  Occupational History   Occupation: Retired  Tobacco Use   Smoking status: Never   Smokeless tobacco: Never  Vaping Use   Vaping status: Never Used  Substance and Sexual Activity   Alcohol use: No    Alcohol/week: 0.0 standard drinks of alcohol   Drug use: No  Sexual activity: Yes    Partners: Female    Birth control/protection: None  Other Topics Concern   Not on file  Social History Narrative   Lives at home w/ his wife   Right-handed   Caffeine: 2 cups of coffee each morning   Social Drivers of Health   Financial Resource Strain: Low Risk  (01/09/2024)    Overall Financial Resource Strain (CARDIA)    Difficulty of Paying Living Expenses: Not hard at all  Food Insecurity: No Food Insecurity (01/09/2024)   Hunger Vital Sign    Worried About Running Out of Food in the Last Year: Never true    Ran Out of Food in the Last Year: Never true  Transportation Needs: No Transportation Needs (01/09/2024)   PRAPARE - Administrator, Civil Service (Medical): No    Lack of Transportation (Non-Medical): No  Physical Activity: Sufficiently Active (01/09/2024)   Exercise Vital Sign    Days of Exercise per Week: 6 days    Minutes of Exercise per Session: 60 min  Stress: No Stress Concern Present (01/09/2024)   [employer] of Occupational Health - Occupational Stress Questionnaire    Feeling of Stress : Not at all  Social Connections: Socially Integrated (01/09/2024)   Social Connection and Isolation Panel    Frequency of Communication with Friends and Family: Three times a week    Frequency of Social Gatherings with Friends and Family: Three times a week    Attends Religious Services: 1 to 4 times per year    Active Member of Clubs or Organizations: No    Attends Engineer, structural: More than 4 times per year    Marital Status: Married  Catering manager Violence: Not At Risk (10/01/2023)   Humiliation, Afraid, Rape, and Kick questionnaire    Fear of Current or Ex-Partner: No    Emotionally Abused: No    Physically Abused: No    Sexually Abused: No      PHYSICAL EXAM   Generalized: Well developed, in no acute distress   Neurological examination  Mentation: Alert oriented to time, place, history taking. Follows all commands speech and language fluent Cranial nerve II-XII: Facial symmetry noted  DIAGNOSTIC DATA (LABS, IMAGING, TESTING) - I reviewed patient records, labs, notes, testing and imaging myself where available.  Lab Results  Component Value Date   WBC 12.1 (H) 10/02/2023   HGB 14.4 10/02/2023   HCT 42.4  10/02/2023   MCV 97.0 10/02/2023   PLT 210 10/02/2023      Component Value Date/Time   NA 133 (L) 10/02/2023 0327   NA 146 (H) 12/08/2021 1134   K 4.7 10/02/2023 0327   CL 101 10/02/2023 0327   CO2 22 10/02/2023 0327   GLUCOSE 117 (H) 10/02/2023 0327   GLUCOSE 94 08/20/2006 0830   BUN 22 10/02/2023 0327   BUN 28 (H) 12/08/2021 1134   CREATININE 1.03 10/02/2023 0327   CREATININE 1.38 (H) 06/09/2020 0811   CALCIUM 8.7 (L) 10/02/2023 0327   PROT 7.1 07/02/2023 1027   PROT 6.6 12/08/2021 1134   ALBUMIN 4.1 07/02/2023 1027   ALBUMIN 4.4 12/08/2021 1134   AST 30 07/02/2023 1027   ALT 34 07/02/2023 1027   ALKPHOS 42 07/02/2023 1027   BILITOT 0.3 07/02/2023 1027   BILITOT 0.4 12/08/2021 1134   GFRNONAA >60 10/02/2023 0327   GFRAA 78 08/17/2020 1011   Lab Results  Component Value Date   CHOL 175 03/14/2023   HDL 52.70  03/14/2023   LDLCALC 104 (H) 03/14/2023   LDLDIRECT 135.0 07/16/2013   TRIG 90.0 03/14/2023   CHOLHDL 3 03/14/2023   Lab Results  Component Value Date   HGBA1C 6.1 (H) 09/25/2023   No results found for: VITAMINB12 Lab Results  Component Value Date   TSH 1.87 03/14/2023      ASSESSMENT AND PLAN 74 y.o. year old male  has a past medical history of ALLERGIC RHINITIS (04/22/2007), Allergy, Cancer (HCC), Complication of anesthesia, DDD (degenerative disc disease), cervical, Headache(784.0), History of kidney stones, History of skin cancer, History of TMJ disorder, HYPERCHOLESTEROLEMIA (04/22/2007), HYPERLIPIDEMIA (07/29/2007), Hyperplastic colon polyp, NEPHROLITHIASIS, HX OF (07/29/2007), Osteoarth NOS-Unspec (04/22/2007), Pneumonia, Shoulder pain, acute, and Sleep apnea. here with:   1.  Obstructive sleep apnea on CPAP   -Good compliance -Good treatment of apnea -Encouraged to use CPAP nightly and greater than 4 hours each night   2.  Migraine headaches    -Continue Cymbalta  to 30 mg daily.  Advised him to call in 1 month and we will continue to  reduce medication  -Continue amitriptyline  100 mg at bedtime -Continue gabapentin  300 mg At bedtime -Continue Ajovy  monthly injection -Continue sumatriptan  for abortive therapy   FU in 6 months to continue to reduce medication    Duwaine Russell, MSN, NP-C 06/12/2024, 3:00 PM [facility] [address] [place], [place] [address] [phone]

## 2024-07-29 ENCOUNTER — Encounter: Payer: Self-pay | Admitting: *Deleted

## 2024-07-29 DIAGNOSIS — K080 Exfoliation of teeth due to systemic causes: Secondary | ICD-10-CM | POA: Diagnosis not present

## 2024-07-29 NOTE — Telephone Encounter (Signed)
 Include on application/cover sheet patient's ID number for the foundation. [id number]

## 2024-08-04 MED ORDER — AJOVY 225 MG/1.5ML ~~LOC~~ SOAJ: 225.0000 mg | SUBCUTANEOUS | 3 refills | Status: DC

## 2024-08-04 NOTE — Addendum Note (Signed)
 Addended by: HILLIARD HEATHER CROME on: 08/04/2024 08:36 AM   Modules accepted: Orders

## 2024-08-05 ENCOUNTER — Telehealth: Payer: Self-pay

## 2024-08-05 ENCOUNTER — Other Ambulatory Visit (HOSPITAL_COMMUNITY): Payer: Self-pay

## 2024-08-05 NOTE — Telephone Encounter (Signed)
 Pharmacy Patient Advocate Encounter   Received notification from Fax that prior authorization for Ajovy  is required/requested.   Insurance verification completed.   The patient is insured through [facility].   Per test claim: PA required; PA submitted to above mentioned insurance via Latent Key/confirmation #/EOC [id number] Status is pending

## 2024-08-06 NOTE — Telephone Encounter (Signed)
   RICK Mems from [employer] stating that Pt is in Medication Program for AJOVY  and there is no member review required from them .   Phone discounted before I can get callback number

## 2024-08-13 DIAGNOSIS — Z0000 Encounter for general adult medical examination without abnormal findings: Secondary | ICD-10-CM | POA: Diagnosis not present

## 2024-08-13 DIAGNOSIS — Z125 Encounter for screening for malignant neoplasm of prostate: Secondary | ICD-10-CM | POA: Diagnosis not present

## 2024-08-18 DIAGNOSIS — E119 Type 2 diabetes mellitus without complications: Secondary | ICD-10-CM | POA: Diagnosis not present

## 2024-09-01 NOTE — Addendum Note (Signed)
 Addended by: Maybree Riling-JACKSON, Jerald Villalona L on: 09/01/2024 04:06 PM   Modules accepted: Orders

## 2024-09-01 NOTE — Telephone Encounter (Signed)
 Regarding 08/05/24 PA note please advise if refill request is appropriate. Last fill in February 2025.

## 2024-09-03 NOTE — Telephone Encounter (Signed)
 He can continue ajovy . That was in my last note. Is there something else needed?

## 2024-09-04 MED ORDER — AJOVY 225 MG/1.5ML ~~LOC~~ SOAJ: 225.0000 mg | SUBCUTANEOUS | 3 refills | Status: DC

## 2024-09-04 NOTE — Addendum Note (Signed)
 Addended by: Zakayla Martinec-JACKSON, Jery Hollern L on: 09/04/2024 07:39 AM   Modules accepted: Orders

## 2024-09-05 DIAGNOSIS — R0981 Nasal congestion: Secondary | ICD-10-CM | POA: Diagnosis not present

## 2024-09-05 DIAGNOSIS — R051 Acute cough: Secondary | ICD-10-CM | POA: Diagnosis not present

## 2024-09-15 ENCOUNTER — Other Ambulatory Visit: Payer: Self-pay

## 2024-09-15 ENCOUNTER — Other Ambulatory Visit: Payer: Self-pay | Admitting: Adult Health

## 2024-09-15 MED ORDER — AJOVY 225 MG/1.5ML ~~LOC~~ SOAJ: 225.0000 mg | SUBCUTANEOUS | 3 refills | Status: AC

## 2024-10-01 ENCOUNTER — Other Ambulatory Visit (HOSPITAL_COMMUNITY): Payer: Self-pay

## 2024-10-01 ENCOUNTER — Telehealth: Payer: Self-pay

## 2024-10-01 NOTE — Telephone Encounter (Signed)
 Pharmacy Patient Advocate Encounter   Received notification from [facility] that prior authorization for Ozempic  4 is required/requested.   Insurance verification completed.   The patient is insured through [facility].   Per test claim: Refill too soon. PA is not needed at this time. Medication was filled 09/22/24. Next eligible fill date is 12/01/24.

## 2024-12-11 ENCOUNTER — Telehealth: Admitting: Adult Health
